# Patient Record
Sex: Male | Born: 1955 | Race: Black or African American | Hispanic: No | Marital: Single | Smoking: Former smoker
Health system: Southern US, Community
[De-identification: ages and names within clinical notes are randomized; demographics above are authoritative.]

## PROBLEM LIST (undated history)

## (undated) DIAGNOSIS — Z8719 Personal history of other diseases of the digestive system: Secondary | ICD-10-CM

## (undated) DIAGNOSIS — K56600 Partial intestinal obstruction, unspecified as to cause: Secondary | ICD-10-CM

## (undated) DIAGNOSIS — K5909 Other constipation: Secondary | ICD-10-CM

## (undated) DIAGNOSIS — D649 Anemia, unspecified: Secondary | ICD-10-CM

## (undated) DIAGNOSIS — F79 Unspecified intellectual disabilities: Secondary | ICD-10-CM

## (undated) DIAGNOSIS — I5022 Chronic systolic (congestive) heart failure: Secondary | ICD-10-CM

## (undated) DIAGNOSIS — F209 Schizophrenia, unspecified: Secondary | ICD-10-CM

## (undated) DIAGNOSIS — N400 Enlarged prostate without lower urinary tract symptoms: Secondary | ICD-10-CM

## (undated) DIAGNOSIS — K56609 Unspecified intestinal obstruction, unspecified as to partial versus complete obstruction: Secondary | ICD-10-CM

## (undated) HISTORY — DX: Benign prostatic hyperplasia without lower urinary tract symptoms: N40.0

## (undated) HISTORY — PX: ABDOMINAL HYSTERECTOMY: SHX81

---

## 2007-05-22 ENCOUNTER — Emergency Department (HOSPITAL_COMMUNITY): Admission: EM | Admit: 2007-05-22 | Discharge: 2007-05-22 | Payer: Self-pay | Admitting: Emergency Medicine

## 2009-11-18 ENCOUNTER — Observation Stay (HOSPITAL_COMMUNITY): Admission: EM | Admit: 2009-11-18 | Discharge: 2009-11-19 | Payer: Self-pay | Admitting: Emergency Medicine

## 2011-01-08 LAB — BASIC METABOLIC PANEL
BUN: 18 mg/dL (ref 6–23)
Calcium: 9.6 mg/dL (ref 8.4–10.5)
Creatinine, Ser: 1.19 mg/dL (ref 0.4–1.5)
GFR calc non Af Amer: >60 mL/min (ref >60)
Glucose, Bld: 80 mg/dL (ref 70–99)

## 2011-01-08 LAB — CBC
Platelets: 298 10*3/uL (ref 150–400)
RDW: 15.2 % (ref 11.5–15.5)
WBC: 14.2 10*3/uL — ABNORMAL HIGH (ref 4.0–10.5)

## 2011-01-08 LAB — URINALYSIS, ROUTINE W REFLEX MICROSCOPIC
Glucose, UA: NEGATIVE mg/dL
Leukocytes, UA: NEGATIVE
Nitrite: NEGATIVE
Protein, ur: NEGATIVE mg/dL
pH: 6 (ref 5.0–8.0)

## 2011-01-08 LAB — RAPID URINE DRUG SCREEN, HOSP PERFORMED
Cocaine: NOT DETECTED
Opiates: NOT DETECTED
Tetrahydrocannabinol: NOT DETECTED

## 2011-01-08 LAB — HEPATIC FUNCTION PANEL
ALT: 26 U/L (ref 0–53)
Bilirubin, Direct: 0.2 mg/dL (ref 0.0–0.3)
Indirect Bilirubin: 0.4 mg/dL (ref 0.3–0.9)
Total Protein: 6.2 g/dL (ref 6.0–8.3)

## 2011-01-08 LAB — DIFFERENTIAL
Basophils Absolute: 0.2 10*3/uL — ABNORMAL HIGH (ref 0.0–0.1)
Lymphocytes Relative: 16 % (ref 12–46)
Lymphs Abs: 2.3 10*3/uL (ref 0.7–4.0)
Neutrophils Relative %: 72 % (ref 43–77)

## 2011-01-08 LAB — ACETAMINOPHEN LEVEL: Acetaminophen (Tylenol), Serum: <10 ug/mL — ABNORMAL LOW (ref 10–30)

## 2011-01-08 LAB — POCT CARDIAC MARKERS
CKMB, poc: 4.4 ng/mL (ref 1.0–8.0)
Troponin i, poc: <0.05 ng/mL (ref 0.00–0.09)

## 2011-01-08 LAB — URINE MICROSCOPIC-ADD ON

## 2011-01-08 LAB — ETHANOL: Alcohol, Ethyl (B): <5 mg/dL (ref 0–10)

## 2011-11-14 ENCOUNTER — Ambulatory Visit (INDEPENDENT_AMBULATORY_CARE_PROVIDER_SITE_OTHER): Payer: Medicare Other

## 2011-11-14 DIAGNOSIS — Z23 Encounter for immunization: Secondary | ICD-10-CM

## 2011-11-14 DIAGNOSIS — Z125 Encounter for screening for malignant neoplasm of prostate: Secondary | ICD-10-CM

## 2011-11-14 DIAGNOSIS — K5900 Constipation, unspecified: Secondary | ICD-10-CM

## 2011-11-14 DIAGNOSIS — Z0000 Encounter for general adult medical examination without abnormal findings: Secondary | ICD-10-CM

## 2012-04-25 ENCOUNTER — Emergency Department (HOSPITAL_COMMUNITY): Payer: Medicare Other

## 2012-04-25 ENCOUNTER — Observation Stay (HOSPITAL_COMMUNITY)
Admission: EM | Admit: 2012-04-25 | Discharge: 2012-04-26 | Payer: Medicare Other | Attending: Family Medicine | Admitting: Family Medicine

## 2012-04-25 ENCOUNTER — Ambulatory Visit (INDEPENDENT_AMBULATORY_CARE_PROVIDER_SITE_OTHER): Payer: Medicare Other | Admitting: Emergency Medicine

## 2012-04-25 ENCOUNTER — Encounter (HOSPITAL_COMMUNITY): Payer: Self-pay | Admitting: *Deleted

## 2012-04-25 ENCOUNTER — Ambulatory Visit: Payer: Medicare Other

## 2012-04-25 VITALS — BP 112/76 | HR 114 | Temp 98.7°F | Resp 20 | Ht 72.25 in | Wt 168.0 lb

## 2012-04-25 DIAGNOSIS — K5900 Constipation, unspecified: Secondary | ICD-10-CM

## 2012-04-25 DIAGNOSIS — F32A Depression, unspecified: Secondary | ICD-10-CM | POA: Diagnosis present

## 2012-04-25 DIAGNOSIS — D72829 Elevated white blood cell count, unspecified: Secondary | ICD-10-CM | POA: Diagnosis present

## 2012-04-25 DIAGNOSIS — Z23 Encounter for immunization: Secondary | ICD-10-CM | POA: Insufficient documentation

## 2012-04-25 DIAGNOSIS — K56600 Partial intestinal obstruction, unspecified as to cause: Secondary | ICD-10-CM | POA: Diagnosis present

## 2012-04-25 DIAGNOSIS — Z79899 Other long term (current) drug therapy: Secondary | ICD-10-CM | POA: Insufficient documentation

## 2012-04-25 DIAGNOSIS — K56609 Unspecified intestinal obstruction, unspecified as to partial versus complete obstruction: Principal | ICD-10-CM | POA: Insufficient documentation

## 2012-04-25 DIAGNOSIS — R112 Nausea with vomiting, unspecified: Secondary | ICD-10-CM | POA: Insufficient documentation

## 2012-04-25 DIAGNOSIS — E860 Dehydration: Secondary | ICD-10-CM | POA: Diagnosis present

## 2012-04-25 DIAGNOSIS — R109 Unspecified abdominal pain: Secondary | ICD-10-CM | POA: Insufficient documentation

## 2012-04-25 DIAGNOSIS — R1110 Vomiting, unspecified: Secondary | ICD-10-CM

## 2012-04-25 DIAGNOSIS — F3289 Other specified depressive episodes: Secondary | ICD-10-CM | POA: Insufficient documentation

## 2012-04-25 DIAGNOSIS — F419 Anxiety disorder, unspecified: Secondary | ICD-10-CM | POA: Diagnosis present

## 2012-04-25 DIAGNOSIS — F411 Generalized anxiety disorder: Secondary | ICD-10-CM | POA: Insufficient documentation

## 2012-04-25 DIAGNOSIS — F17200 Nicotine dependence, unspecified, uncomplicated: Secondary | ICD-10-CM | POA: Insufficient documentation

## 2012-04-25 DIAGNOSIS — F341 Dysthymic disorder: Secondary | ICD-10-CM

## 2012-04-25 DIAGNOSIS — K5901 Slow transit constipation: Secondary | ICD-10-CM | POA: Diagnosis present

## 2012-04-25 DIAGNOSIS — F329 Major depressive disorder, single episode, unspecified: Secondary | ICD-10-CM | POA: Diagnosis present

## 2012-04-25 DIAGNOSIS — F79 Unspecified intellectual disabilities: Secondary | ICD-10-CM | POA: Diagnosis present

## 2012-04-25 DIAGNOSIS — F209 Schizophrenia, unspecified: Secondary | ICD-10-CM | POA: Diagnosis present

## 2012-04-25 DIAGNOSIS — K08109 Complete loss of teeth, unspecified cause, unspecified class: Secondary | ICD-10-CM | POA: Diagnosis present

## 2012-04-25 HISTORY — DX: Schizophrenia, unspecified: F20.9

## 2012-04-25 HISTORY — DX: Anemia, unspecified: D64.9

## 2012-04-25 HISTORY — DX: Unspecified intellectual disabilities: F79

## 2012-04-25 LAB — COMPREHENSIVE METABOLIC PANEL
ALT: 14 U/L (ref 0–53)
BUN: 21 mg/dL (ref 6–23)
CO2: 27 mEq/L (ref 19–32)
Calcium: 9.7 mg/dL (ref 8.4–10.5)
Chloride: 105 mEq/L (ref 96–112)
Creat: 1.16 mg/dL (ref 0.50–1.35)
Glucose, Bld: 127 mg/dL — ABNORMAL HIGH (ref 70–99)
Total Bilirubin: 0.6 mg/dL (ref 0.3–1.2)

## 2012-04-25 LAB — POCT I-STAT, CHEM 8
Chloride: 103 mEq/L (ref 96–112)
Glucose, Bld: 124 mg/dL — ABNORMAL HIGH (ref 70–99)
HCT: 43 % (ref 39.0–52.0)
Hemoglobin: 14.6 g/dL (ref 13.0–17.0)
Potassium: 4 mEq/L (ref 3.5–5.1)

## 2012-04-25 LAB — POCT CBC
Granulocyte percent: 64 %G (ref 37–80)
HCT, POC: 47.5 % (ref 43.5–53.7)
Lymph, poc: 2.6 (ref 0.6–3.4)
MCH, POC: 30.3 pg (ref 27–31.2)
MCHC: 31.2 g/dL — AB (ref 31.8–35.4)
MCV: 97.1 fL — AB (ref 80–97)
MID (cbc): 1.6 — AB (ref 0–0.9)
POC LYMPH PERCENT: 22.3 %L (ref 10–50)
Platelet Count, POC: 348 10*3/uL (ref 142–424)
RDW, POC: 16.3 %
WBC: 11.7 10*3/uL — AB (ref 4.6–10.2)

## 2012-04-25 LAB — AMYLASE: Amylase: 35 U/L (ref 0–105)

## 2012-04-25 LAB — CBC
Hemoglobin: 14.1 g/dL (ref 13.0–17.0)
MCHC: 32.9 g/dL (ref 30.0–36.0)
RDW: 16.1 % — ABNORMAL HIGH (ref 11.5–15.5)
WBC: 10 10*3/uL (ref 4.0–10.5)

## 2012-04-25 LAB — TSH: TSH: 0.472 u[IU]/mL (ref 0.350–4.500)

## 2012-04-25 LAB — LIPASE: Lipase: 12 U/L (ref 0–75)

## 2012-04-25 MED ORDER — CLONAZEPAM 0.5 MG PO TABS
0.5000 mg | ORAL_TABLET | Freq: Two times a day (BID) | ORAL | Status: DC
  Administered 2012-04-25 – 2012-04-26 (×2): 0.5 mg via ORAL
  Filled 2012-04-25 (×2): qty 1

## 2012-04-25 MED ORDER — ONDANSETRON HCL 4 MG/2ML IJ SOLN: 4.0000 mg | Freq: Four times a day (QID) | INTRAMUSCULAR | Status: DC | PRN

## 2012-04-25 MED ORDER — ONDANSETRON HCL 4 MG PO TABS: 4.0000 mg | ORAL_TABLET | Freq: Four times a day (QID) | ORAL | Status: DC | PRN

## 2012-04-25 MED ORDER — PNEUMOCOCCAL VAC POLYVALENT 25 MCG/0.5ML IJ INJ
0.5000 mL | INJECTION | INTRAMUSCULAR | Status: AC
  Administered 2012-04-26: 0.5 mL via INTRAMUSCULAR
  Filled 2012-04-25: qty 0.5

## 2012-04-25 MED ORDER — SODIUM CHLORIDE 0.9 % IV SOLN
INTRAVENOUS | Status: DC
  Administered 2012-04-25 – 2012-04-26 (×2): via INTRAVENOUS

## 2012-04-25 MED ORDER — SORBITOL 70 % SOLN
960.0000 mL | TOPICAL_OIL | Freq: Once | ORAL | Status: AC
  Administered 2012-04-25: 960 mL via RECTAL
  Filled 2012-04-25: qty 240

## 2012-04-25 MED ORDER — SODIUM CHLORIDE 0.9 % IV BOLUS (SEPSIS)
1000.0000 mL | Freq: Once | INTRAVENOUS | Status: AC
  Administered 2012-04-25: 1000 mL via INTRAVENOUS

## 2012-04-25 MED ORDER — CLOZAPINE 100 MG PO TABS
300.0000 mg | ORAL_TABLET | Freq: Every day | ORAL | Status: DC
  Administered 2012-04-25: 300 mg via ORAL
  Filled 2012-04-25 (×2): qty 3

## 2012-04-25 MED ORDER — ACETAMINOPHEN 325 MG PO TABS: 650.0000 mg | ORAL_TABLET | Freq: Four times a day (QID) | ORAL | Status: DC | PRN

## 2012-04-25 MED ORDER — IOHEXOL 300 MG/ML  SOLN
100.0000 mL | Freq: Once | INTRAMUSCULAR | Status: AC | PRN
  Administered 2012-04-25: 100 mL via INTRAVENOUS

## 2012-04-25 MED ORDER — SODIUM CHLORIDE 0.9 % IV SOLN
INTRAVENOUS | Status: DC
  Administered 2012-04-25: 14:00:00 via INTRAVENOUS

## 2012-04-25 MED ORDER — POLYETHYLENE GLYCOL 3350 17 G PO PACK
17.0000 g | PACK | Freq: Every day | ORAL | Status: DC
  Administered 2012-04-25: 17 g via ORAL
  Filled 2012-04-25 (×2): qty 1

## 2012-04-25 MED ORDER — BENZTROPINE MESYLATE 1 MG PO TABS
1.0000 mg | ORAL_TABLET | Freq: Three times a day (TID) | ORAL | Status: DC
  Administered 2012-04-25 – 2012-04-26 (×3): 1 mg via ORAL
  Filled 2012-04-25 (×5): qty 1

## 2012-04-25 MED ORDER — SODIUM CHLORIDE 0.9 % IV SOLN
INTRAVENOUS | Status: AC
  Administered 2012-04-25: 19:00:00 via INTRAVENOUS

## 2012-04-25 MED ORDER — ONDANSETRON HCL 4 MG/2ML IJ SOLN
4.0000 mg | Freq: Three times a day (TID) | INTRAMUSCULAR | Status: AC | PRN
Start: 2012-04-25 — End: 2012-04-26

## 2012-04-25 MED ORDER — PANTOPRAZOLE SODIUM 40 MG PO TBEC
40.0000 mg | DELAYED_RELEASE_TABLET | Freq: Every day | ORAL | Status: DC
  Administered 2012-04-26: 40 mg via ORAL
  Filled 2012-04-25: qty 1

## 2012-04-25 MED ORDER — ENOXAPARIN SODIUM 40 MG/0.4ML ~~LOC~~ SOLN
40.0000 mg | SUBCUTANEOUS | Status: DC
  Administered 2012-04-25: 40 mg via SUBCUTANEOUS
  Filled 2012-04-25 (×2): qty 0.4

## 2012-04-25 MED ORDER — ONDANSETRON HCL 4 MG/2ML IJ SOLN
4.0000 mg | Freq: Once | INTRAMUSCULAR | Status: AC
  Administered 2012-04-25: 4 mg via INTRAVENOUS
  Filled 2012-04-25: qty 2

## 2012-04-25 MED ORDER — PAROXETINE HCL 20 MG PO TABS
20.0000 mg | ORAL_TABLET | Freq: Every day | ORAL | Status: DC
  Administered 2012-04-25 – 2012-04-26 (×2): 20 mg via ORAL
  Filled 2012-04-25 (×2): qty 1

## 2012-04-25 MED ORDER — ACETAMINOPHEN 650 MG RE SUPP: 650.0000 mg | Freq: Four times a day (QID) | RECTAL | Status: DC | PRN

## 2012-04-25 MED ORDER — POLYETHYLENE GLYCOL 3350 17 G PO PACK
17.0000 g | PACK | Freq: Two times a day (BID) | ORAL | Status: DC
  Administered 2012-04-25: 17 g via ORAL
  Filled 2012-04-25 (×3): qty 1

## 2012-04-25 NOTE — Progress Notes (Signed)
  Subjective:    Patient ID: Jordan Kennedy, male    DOB: 1956-03-16, 56 y.o.   MRN: [id number]  HPI patient is a mentally challenged schizophrenic who lives in a group home who enters with a chief complaint of difficulty having a bowel movement. He denies any fever or chills. He has had 2 episodes of vomiting last night he states he feels better today but still uncomfortable in his abdomen    Review of Systems     Objective:   Physical Exam physical exam reveals an alert gentleman who is not in any distress. He is cooperative. His chest is clear his cardiac exam reveals occasional skipped beats but no murmurs or gallops. The abdomen is appears to be on visual inspection full on the right however I do not feel a definite mass in this area. He did not seem to be tender in any areas of his abdomen.His  exam revealed a normal size prostate but there was no stool in the rectal vault. hemosure was done.  Results for orders placed in visit on 04/25/12  POCT CBC      Component Value Range   WBC 11.7 (*) 4.6 - 10.2 K/uL   Lymph, poc 2.6  0.6 - 3.4   POC LYMPH PERCENT 22.3  10 - 50 %L   MID (cbc) 1.6 (*) 0 - 0.9   POC MID % 13.7 (*) 0 - 12 %M   POC Granulocyte 7.5 (*) 2 - 6.9   Granulocyte percent 64.0  37 - 80 %G   RBC 4.89  4.69 - 6.13 M/uL   Hemoglobin 14.8  14.1 - 18.1 g/dL   HCT, POC 40.9  81.1 - 53.7 %   MCV 97.1 (*) 80 - 97 fL   MCH, POC 30.3  27 - 31.2 pg   MCHC 31.2 (*) 31.8 - 35.4 g/dL   RDW, POC 91.4     Platelet Count, POC 348  142 - 424 K/uL   MPV 8.8  0 - 99.8 fL  IFOBT (OCCULT BLOOD)      Component Value Range   IFOBT Negative     [facility] reading (PRIMARY) by  Dr. Cleta Alberts x-ray shows a significant stool burden with markedly dilated colon there does appear to be some air in the rectum. There is no free air noted beneath the diaphragm. .      Assessment & Plan:  Case discussed with radiology. There is a suspicion this could be an early small bowel obstruction. There does appear  to be chronic constipation also. Patient to be sent to the emergency room for evaluation. I gave these instructions to his caregiver and he is to go from here to the emergency room at [facility]

## 2012-04-25 NOTE — Patient Instructions (Addendum)
Please go to the emergency room at [facility] . They will evaluate you through the emergency room for possible early small bowel obstruction.

## 2012-04-25 NOTE — Progress Notes (Addendum)
Jordan Kennedy tolerated appx 650 ccs of the SMOG enema. Then started feeling strong pressure to move bowels, which moved strongly but with very little solid matter moved at first. Shortly afterward, pt experienced a large bowel movement and stated he felt relief. Merlyn Albert [employer] fundraiser

## 2012-04-25 NOTE — H&P (Signed)
History and Physical Examination  Date: 04/25/2012  Patient name: Jordan Kennedy Medical record number: [id number] Date of birth: [birth year] Age: 56 y.o. Gender: male PCP: GUEST, Loretha Stapler, MD  Chief Complaint:  Chief Complaint  Patient presents with  . Abdominal Pain    Historians: History was taken from emergency department physician, records and patient who was a poor historian because of his mental retardation.  History of Present Illness: Jordan Kennedy is an 56 y.o. male group home resident with schizophrenia and history of mental retardation was sent to the emergency department today because of 2 days of nausea and vomiting.  The patient also complained of abdominal distention and pain.  He had complained of being constipated for the last several days and symptoms of worsening constipation reported.  He saw his primary care provider today who ordered a CBC which revealed an elevated white blood cell count of 11.7 and x-rays of the abdomen significant for large stool burden and concern for possible partial small bowel obstruction.  Patient was sent to the emergency department and evaluated.  He had a CT scan that confirmed partial small bowel obstruction and significant stool burden.  The patient was found to be clinically dehydrated.  He was started on IV fluids.  An observation admission was requested for assistance with controlling his chronic constipation and partial small bowel obstruction.  Also, he is being treated with IV fluids for dehydration.  His vomiting has resolved but he still has nausea at this time.  Past Medical History Past Medical History  Diagnosis Date  . Schizophrenia   . Mental retardation     Past Surgical History History reviewed. No pertinent past surgical history.  Home Meds: Prior to Admission medications   Medication Sig Start Date End Date Taking? Authorizing Provider  benztropine (COGENTIN) 1 MG tablet Take 1 mg by mouth 3 (three) times daily.    Yes Historical Provider, MD  clonazePAM (KLONOPIN) 0.5 MG tablet Take 0.5 mg by mouth 2 (two) times daily.   Yes Historical Provider, MD  cloZAPine (CLOZARIL) 100 MG tablet Take 300 mg by mouth at bedtime.    Yes Historical Provider, MD  PARoxetine (PAXIL) 20 MG tablet Take 20 mg by mouth every morning.   Yes Historical Provider, MD    Allergies: Review of patient's allergies indicates no known allergies.  Social History:  History   Social History  . Marital Status: Single    Spouse Name: N/A    Number of Children: N/A  . Years of Education: N/A   Occupational History  . Not on file.   Social History Main Topics  . Smoking status: Current Everyday Smoker -- 0.3 packs/day    Types: Cigarettes  . Smokeless tobacco: Not on file  . Alcohol Use: No  . Drug Use: Not on file  . Sexually Active: Not on file   Other Topics Concern  . Not on file   Social History Narrative  . No narrative on file   Family History: Noncontributory  Review of Systems: Pertinent items are noted in HPI. All other systems reviewed and reported as negative.   Physical Exam: Blood pressure 106/87, pulse 104, temperature 99.4 F (37.4 C), temperature source Oral, resp. rate 20, SpO2 100.00%. General appearance: alert, cooperative, appears stated age and no distress Head: Normocephalic, without obvious abnormality, atraumatic Ears: normal TM's and external ear canals both ears Nose: Nares normal. Septum midline. Mucosa normal. No drainage or sinus tenderness., no discharge Throat: edentulous Neck: no adenopathy,  no carotid bruit, no JVD, supple, symmetrical, trachea midline and thyroid not enlarged, symmetric, no tenderness/mass/nodules Lungs: clear to auscultation bilaterally and normal percussion bilaterally Chest wall: no tenderness Heart: regular rate and rhythm, S1, S2 normal, no murmur, click, rub or gallop Abdomen: mild distension, normal BS, no masses palpated, no HSM, generalized TTP, no  guarding or rebound TTP Extremities: extremities normal, atraumatic, no cyanosis or edema Pulses: 2+ and symmetric Skin: Skin color, texture, turgor normal. No rashes or lesions Neurologic: Grossly normal  Lab  And Imaging results:  Results for orders placed during the hospital encounter of 04/25/12 (from the past 24 hour(s))  POCT I-STAT, CHEM 8     Status: Abnormal   Collection Time   04/25/12  2:06 PM      Component Value Range   Sodium 142  135 - 145 mEq/L   Potassium 4.0  3.5 - 5.1 mEq/L   Chloride 103  96 - 112 mEq/L   BUN 20  6 - 23 mg/dL   Creatinine, Ser 9.14  0.50 - 1.35 mg/dL   Glucose, Bld 782 (*) 70 - 99 mg/dL   Calcium, Ion 9.56  2.13 - 1.23 mmol/L   TCO2 26  0 - 100 mmol/L   Hemoglobin 14.6  13.0 - 17.0 g/dL   HCT 08.6  57.8 - 46.9 %  CBC     Status: Abnormal   Collection Time   04/25/12  3:55 PM      Component Value Range   WBC 10.0  4.0 - 10.5 K/uL   RBC 4.60  4.22 - 5.81 MIL/uL   Hemoglobin 14.1  13.0 - 17.0 g/dL   HCT 62.9  52.8 - 41.3 %   MCV 93.3  78.0 - 100.0 fL   MCH 30.7  26.0 - 34.0 pg   MCHC 32.9  30.0 - 36.0 g/dL   RDW 24.4 (*) 01.0 - 27.2 %   Platelets 309  150 - 400 K/uL     Impression   *Partial small bowel obstruction  Schizophrenia  Mental retardation  Constipation  Leukocytosis, unspecified - resolved on recheck today at 3:55 pm  Dehydration  Anxiety and depression  Edentulous   Plan  Admit for observation, continue clear liquids as tolerated, treat constipation with miralax and provide enema treatment.  Provide medications for nausea, continue IVF hydration.   Resume home medications for psychiatric conditions.   Please see orders and follow hospital course.  DVT prophylaxis and protonix ordered.    Standley Dakins MD [facility] [place], [place] [phone] 04/25/2012, 4:56 PM

## 2012-04-25 NOTE — ED Notes (Addendum)
Pt has been complaining of constipating and generalized abdominal pain starting yesterday.  Pt has history of intermittent constipation. Pt reports last BM was two days ago- no blood noted. Pt has had one episode of N/V yesterday.  Pt abdomen appears distended. Pt was seen by MD Daub today in office and noted to have WBC at 11.7.

## 2012-04-25 NOTE — ED Notes (Addendum)
Pt states he was sent here from PCP for ct scan.  States he had bloodwork and xray done at pcp this am.  Xray showed "bile" according to caregiver.  Hx of constipation, LBM a few days ago.

## 2012-04-25 NOTE — ED Provider Notes (Signed)
History     CSN: [id number]  Arrival date & time 04/25/12  1231   First MD Initiated Contact with Patient 04/25/12 1314      Chief Complaint  Patient presents with  . Abdominal Pain    (Consider location/radiation/quality/duration/timing/severity/associated sxs/prior treatment) Patient is a 56 y.o. male presenting with abdominal pain. The history is provided by the patient and a caregiver. The history is limited by a developmental delay.  Abdominal Pain The primary symptoms of the illness include abdominal pain and vomiting. The primary symptoms of the illness do not include diarrhea.  Additional symptoms associated with the illness include constipation.  pt has mr.  He lives in group home.  He has not had bm for 2 d.  Went to [facility] had xr and cbc. Was sent here for eval.   Pt has abd pain and distention.  Vomited last night once.  No nausea now.  No hx of abd surgery.    Past Medical History  Diagnosis Date  . Schizophrenia   . Mental retardation     History reviewed. No pertinent past surgical history.  No family history on file.  History  Substance Use Topics  . Smoking status: Current Everyday Smoker -- 0.3 packs/day    Types: Cigarettes  . Smokeless tobacco: Not on file  . Alcohol Use: No      Review of Systems  Gastrointestinal: Positive for vomiting, abdominal pain, constipation and abdominal distention. Negative for diarrhea.  All other systems reviewed and are negative.    Allergies  Review of patient's allergies indicates no known allergies.  Home Medications   Current Outpatient Rx  Name Route Sig Dispense Refill  . BENZTROPINE MESYLATE 1 MG PO TABS Oral Take 1 mg by mouth 3 (three) times daily.    Marland Kitchen CLONAZEPAM 0.5 MG PO TABS Oral Take 0.5 mg by mouth 2 (two) times daily.    Marland Kitchen CLOZAPINE 100 MG PO TABS Oral Take 300 mg by mouth at bedtime.     Marland Kitchen PAROXETINE HCL 20 MG PO TABS Oral Take 20 mg by mouth every morning.      BP 120/76  Pulse 111  Temp  98.4 F (36.9 C) (Oral)  Resp 17  SpO2 98%  Physical Exam  Nursing note and vitals reviewed. Constitutional: He appears well-developed and well-nourished. No distress.  HENT:  Head: Normocephalic and atraumatic.  Eyes: Conjunctivae are normal.  Neck: Normal range of motion. Neck supple.  Cardiovascular:  No murmur heard.      tachycardia  Pulmonary/Chest: Effort normal. No respiratory distress. He has no rales.  Abdominal: Soft. He exhibits distension. He exhibits no mass. There is no tenderness. There is no rebound and no guarding.       Decreased bowel sounds  Genitourinary: Rectum normal.       No stool in rectal vault  Musculoskeletal: Normal range of motion. He exhibits no edema.  Neurological: He is alert.  Skin: Skin is warm and dry.    ED Course  Procedures (including critical care time)  Labs Reviewed - No data to display Dg Abd Acute W/chest  04/25/2012  *RADIOLOGY REPORT*  Clinical Data: Constipation  ACUTE ABDOMEN SERIES (ABDOMEN 2 VIEW & CHEST 1 VIEW)  Comparison: Chest radiograph dated 11/18/2009  Findings: Lungs are clear. No pleural effusion or pneumothorax.  Cardiomediastinal silhouette is within normal limits.  Nonspecific bowel gas pattern, with mildly dilated loops of small bowel.  However, the colon is not decompressed.  Moderate stool in  the left colon.  No evidence of free air under the diaphragm on the upright view.  IMPRESSION: Mildly dilated loops of small bowel, favored to reflect adynamic ileus, early/partial small bowel obstruction not entirely excluded.  Moderate stool in the left colon.  No evidence of acute cardiopulmonary disease.  Clinically significant discrepancy from primary report, if provided: None  Original Report Authenticated By: Charline Bills, M.D.     No diagnosis found.    MDM  abd distention        Cheri Guppy, MD 04/25/12 1343

## 2012-04-25 NOTE — ED Provider Notes (Signed)
4:18 PM I will omit this patient for partial small bowel obstruction.  He still nauseated at this time.  He has skipped a small amount of fluids down I think the patient would benefit from 4 hour observation given that he still nauseated and having some abdominal pain.  The hospitalist will be called for admission  The encounter diagnosis was Partial small bowel obstruction.  Ct Abdomen Pelvis W Contrast  04/25/2012  *RADIOLOGY REPORT*  Clinical Data: Abdominal pain.  Possible small bowel obstruction and at radiography.  CT ABDOMEN AND PELVIS WITH CONTRAST  Technique:  Multidetector CT imaging of the abdomen and pelvis was performed following the standard protocol during bolus administration of intravenous contrast.  Contrast:  100 ml Omnipaque-300  Comparison: Radiography same day  Findings: Lung bases show minimal atelectasis.  There may be a tiny amount of pleural fluid on the right.  The liver parenchyma shows a 1 cm low density in the lateral edge of the posterior segment of the right lobe, a questionable 8 mm low density at the dome and a 9 mm low density adjacent to the intrahepatic IVC.  No calcified gallstones.  The spleen is normal. The pancreas is normal.  The adrenal glands are normal.  The kidneys are normal.  The aorta and IVC are normal.  No retroperitoneal mass or adenopathy.  No free intraperitoneal fluid or air.  Bladder, prostate gland and seminal vesicles appear unremarkable.  There is a large amount of fecal matter throughout the colon.  The distal small bowel is collapsed.  The proximal small bowel is dilated, probably to the mid ileal level.  I cannot see a discrete obstructing lesion. Just proximal to the caliber change, there is some "small bowel stool" appearance.  This could be due to material being obstructed at this site or conceivably to a bezoar.  Normal appendix is visualized.  No significant bony finding.  IMPRESSION: Partial small bowel obstruction, probably at the mid ileal  region. Specific etiology not identified. Just proximal to the probable point of bowel obstruction, there is some possible feculent material within the small intestine.  Bezoar is considered as well.  Large amount of fecal matter in the colon consistent with constipation.  Normal appearing appendix.  Three indeterminate low densities in the liver, 8-10 mm in size. Depending on the clinical course and eventual recent for a small bowel findings, evaluation with MRI might be in order to characterize these.  Original Report Authenticated By: Thomasenia Sales, M.D.   Dg Abd Acute W/chest  04/25/2012  *RADIOLOGY REPORT*  Clinical Data: Constipation  ACUTE ABDOMEN SERIES (ABDOMEN 2 VIEW & CHEST 1 VIEW)  Comparison: Chest radiograph dated 11/18/2009  Findings: Lungs are clear. No pleural effusion or pneumothorax.  Cardiomediastinal silhouette is within normal limits.  Nonspecific bowel gas pattern, with mildly dilated loops of small bowel.  However, the colon is not decompressed.  Moderate stool in the left colon.  No evidence of free air under the diaphragm on the upright view.  IMPRESSION: Mildly dilated loops of small bowel, favored to reflect adynamic ileus, early/partial small bowel obstruction not entirely excluded.  Moderate stool in the left colon.  No evidence of acute cardiopulmonary disease.  Clinically significant discrepancy from primary report, if provided: None  Original Report Authenticated By: Charline Bills, M.D.    I personally reviewed the imaging tests through PACS system     Lyanne Co, MD 04/25/12 [phone]

## 2012-04-26 LAB — BASIC METABOLIC PANEL
BUN: 13 mg/dL (ref 6–23)
CO2: 25 mEq/L (ref 19–32)
Chloride: 104 mEq/L (ref 96–112)
Creatinine, Ser: 1.05 mg/dL (ref 0.50–1.35)
GFR calc Af Amer: >90 mL/min (ref >90)
Glucose, Bld: 113 mg/dL — ABNORMAL HIGH (ref 70–99)
Potassium: 3.8 mEq/L (ref 3.5–5.1)

## 2012-04-26 MED ORDER — POLYETHYLENE GLYCOL 3350 17 G PO PACK: 17.0000 g | PACK | Freq: Two times a day (BID) | ORAL | Status: AC

## 2012-04-26 NOTE — Discharge Summary (Signed)
Physician Discharge Summary  Patient ID: Jordan Kennedy MRN: [id number] DOB/AGE: September 14, 1956 56 y.o.  Admit date: 04/25/2012 Discharge date: 04/26/2012  Admission Diagnoses: Partial SBO Chronic Constipation Abdominal pain  Discharge Diagnoses:  Active Problems:  Schizophrenia  Mental retardation  Constipation  Leukocytosis, unspecified  Dehydration  Anxiety and depression  Edentulous  Discharged Condition: good  Hospital Course:   Pt was admitted and given an enema and started on oral miralax therapy.  He had a very large BM after the enema and reported that he felt much better.  Pt tolerated diet and was discharged on oral laxatives.   He will need to wear adult diapers while he is on laxatives.    Discharge Exam: Pt says that he feels much better today.  He had a very large BM and belly is soft and tolerating diet.  No further Nausea and vomiting.  Blood pressure 127/79, pulse 104, temperature 99.5 F (37.5 C), temperature source Oral, resp. rate 18, height 6' (1.829 m), weight 78 kg (171 lb 15.3 oz), SpO2 97.00%. General - awake, alert, no distress Heent - NCAT, MMM Lungs - BBS clear Abd - soft, nondistended, nontender, no masses, normal bs Ext - no CCE  Disposition:  Return to [facility] I spoke with his caretaker and provided discharge instructions, information, etc.    Discharge Orders    Future Orders Please Complete By Expires   Increase activity slowly      Discharge instructions      Comments:   PLEASE HAVE PATIENT WEAR ADULT DIAPERS WHILE HE IS TAKING LAXATIVES RETURN IF SYMPTOMS RECUR, WORSEN OR NEW PROBLEMS DEVELOP.     Medication List  As of 04/26/2012 11:47 AM   TAKE these medications         benztropine 1 MG tablet   Commonly known as: COGENTIN   Take 1 mg by mouth 3 (three) times daily.      clonazePAM 0.5 MG tablet   Commonly known as: KLONOPIN   Take 0.5 mg by mouth 2 (two) times daily.      cloZAPine 100 MG tablet   Commonly known as:  CLOZARIL   Take 300 mg by mouth at bedtime.      PARoxetine 20 MG tablet   Commonly known as: PAXIL   Take 20 mg by mouth every morning.      polyethylene glycol packet   Commonly known as: MIRALAX / GLYCOLAX   Take 17 g by mouth 2 (two) times daily.           Follow-up Information    Follow up with GUEST, Loretha Stapler, MD in 1 week. [facility] Follow Up)    Contact information:   [address] [place] [address] [phone]         Signed: Standley Dakins 04/26/2012, 11:47 AM

## 2012-04-26 NOTE — Progress Notes (Signed)
Patient discharged with caregiver.  Discharge instructions given and prescription given. No change from morning assessment.

## 2012-06-12 ENCOUNTER — Encounter (HOSPITAL_COMMUNITY): Payer: Self-pay | Admitting: Emergency Medicine

## 2012-06-12 ENCOUNTER — Emergency Department (HOSPITAL_COMMUNITY): Payer: Medicare Other

## 2012-06-12 ENCOUNTER — Emergency Department (HOSPITAL_COMMUNITY)
Admission: EM | Admit: 2012-06-12 | Discharge: 2012-06-12 | Disposition: A | Discharge status: Home / Self Care | Payer: Medicare Other | Attending: Emergency Medicine | Admitting: Emergency Medicine

## 2012-06-12 DIAGNOSIS — Z8659 Personal history of other mental and behavioral disorders: Secondary | ICD-10-CM | POA: Insufficient documentation

## 2012-06-12 DIAGNOSIS — R112 Nausea with vomiting, unspecified: Secondary | ICD-10-CM | POA: Insufficient documentation

## 2012-06-12 DIAGNOSIS — K5900 Constipation, unspecified: Secondary | ICD-10-CM

## 2012-06-12 DIAGNOSIS — F79 Unspecified intellectual disabilities: Secondary | ICD-10-CM | POA: Insufficient documentation

## 2012-06-12 DIAGNOSIS — F17200 Nicotine dependence, unspecified, uncomplicated: Secondary | ICD-10-CM | POA: Insufficient documentation

## 2012-06-12 LAB — CBC
HCT: 39.1 % (ref 39.0–52.0)
Hemoglobin: 13.1 g/dL (ref 13.0–17.0)
MCH: 30.8 pg (ref 26.0–34.0)
MCHC: 33.5 g/dL (ref 30.0–36.0)
MCV: 91.8 fL (ref 78.0–100.0)
Platelets: 293 10*3/uL (ref 150–400)
RBC: 4.26 MIL/uL (ref 4.22–5.81)
RDW: 16 % — ABNORMAL HIGH (ref 11.5–15.5)
WBC: 12.5 10*3/uL — ABNORMAL HIGH (ref 4.0–10.5)

## 2012-06-12 LAB — COMPREHENSIVE METABOLIC PANEL
ALT: 17 U/L (ref 0–53)
AST: 18 U/L (ref 0–37)
Albumin: 3.7 g/dL (ref 3.5–5.2)
Alkaline Phosphatase: 86 U/L (ref 39–117)
BUN: 26 mg/dL — ABNORMAL HIGH (ref 6–23)
CO2: 27 mEq/L (ref 19–32)
Calcium: 9 mg/dL (ref 8.4–10.5)
Chloride: 106 mEq/L (ref 96–112)
Creatinine, Ser: 1.32 mg/dL (ref 0.50–1.35)
GFR calc Af Amer: 70 mL/min — ABNORMAL LOW (ref >90)
GFR calc non Af Amer: 60 mL/min — ABNORMAL LOW (ref >90)
Glucose, Bld: 115 mg/dL — ABNORMAL HIGH (ref 70–99)
Potassium: 3.2 mEq/L — ABNORMAL LOW (ref 3.5–5.1)
Sodium: 142 mEq/L (ref 135–145)
Total Bilirubin: 0.5 mg/dL (ref 0.3–1.2)
Total Protein: 6.8 g/dL (ref 6.0–8.3)

## 2012-06-12 LAB — URINE MICROSCOPIC-ADD ON

## 2012-06-12 LAB — URINALYSIS, ROUTINE W REFLEX MICROSCOPIC
Glucose, UA: NEGATIVE mg/dL
Leukocytes, UA: NEGATIVE
Nitrite: NEGATIVE
Protein, ur: 30 mg/dL — AB
Specific Gravity, Urine: 1.034 — ABNORMAL HIGH (ref 1.005–1.030)
Urobilinogen, UA: 1 mg/dL (ref 0.0–1.0)
pH: 5.5 (ref 5.0–8.0)

## 2012-06-12 LAB — LIPASE, BLOOD: Lipase: 17 U/L (ref 11–59)

## 2012-06-12 MED ORDER — POTASSIUM CHLORIDE CRYS ER 20 MEQ PO TBCR
40.0000 meq | EXTENDED_RELEASE_TABLET | Freq: Once | ORAL | Status: AC
  Administered 2012-06-12: 40 meq via ORAL
  Filled 2012-06-12: qty 2

## 2012-06-12 MED ORDER — MORPHINE SULFATE 4 MG/ML IJ SOLN
4.0000 mg | Freq: Once | INTRAMUSCULAR | Status: AC
  Administered 2012-06-12: 4 mg via INTRAVENOUS
  Filled 2012-06-12: qty 1

## 2012-06-12 MED ORDER — ONDANSETRON HCL 4 MG/2ML IJ SOLN
4.0000 mg | Freq: Once | INTRAMUSCULAR | Status: AC
  Administered 2012-06-12: 4 mg via INTRAVENOUS
  Filled 2012-06-12: qty 2

## 2012-06-12 MED ORDER — DOCUSATE SODIUM 100 MG PO CAPS
100.0000 mg | ORAL_CAPSULE | Freq: Two times a day (BID) | ORAL | Status: AC
Start: 2012-06-12 — End: 2012-06-22

## 2012-06-12 MED ORDER — SODIUM CHLORIDE 0.9 % IV BOLUS (SEPSIS)
1000.0000 mL | Freq: Once | INTRAVENOUS | Status: AC
  Administered 2012-06-12: 1000 mL via INTRAVENOUS

## 2012-06-12 NOTE — ED Notes (Signed)
Urinal Given, Patient advises unable to void.

## 2012-06-12 NOTE — ED Notes (Signed)
Pt is from group home. Caregiver states pt has been complaining of pain, nausea, and vomiting. Caregiver states that pt has not had a bowel movement over the past week and prescribed medications are not working.

## 2012-06-12 NOTE — ED Provider Notes (Signed)
History    56 year old male brought in for evaluation of nausea and vomiting. Onset yesterday. Multiple episodes of nonbloody emesis since then. Crampy, diffuse abdominal pain. Does not localize. History of mental retardation and is in a group home. History is somewhat limited from the patient but patient has a group home worker with him at bedside. No hx of abdominal surgery reported. No fevers or chills. No urinary complaints. No diarrhea. No sick contacts.  CSN: [id number]  Arrival date & time 06/12/12  0919   First MD Initiated Contact with Patient 06/12/12 [phone]      Chief Complaint  Patient presents with  . Abdominal Pain  . Emesis    (Consider location/radiation/quality/duration/timing/severity/associated sxs/prior treatment) HPI  Past Medical History  Diagnosis Date  . Schizophrenia   . Mental retardation   . Anemia     Past Surgical History  Procedure Date  . Abdominal hysterectomy     History reviewed. No pertinent family history.  History  Substance Use Topics  . Smoking status: Current Everyday Smoker -- 0.2 packs/day for 30 years    Types: Cigarettes  . Smokeless tobacco: Never Used  . Alcohol Use: No      Review of Systems   Review of symptoms negative unless otherwise noted in HPI.   Allergies  Review of patient's allergies indicates no known allergies.  Home Medications   Current Outpatient Rx  Name Route Sig Dispense Refill  . BENZTROPINE MESYLATE 1 MG PO TABS Oral Take 1 mg by mouth 3 (three) times daily.    Marland Kitchen CLONAZEPAM 0.5 MG PO TABS Oral Take 0.5 mg by mouth 2 (two) times daily.    Marland Kitchen CLOZAPINE 100 MG PO TABS Oral Take 200-300 mg by mouth See admin instructions. Takes 200mg  every morning and 300mg  every night at bedtime    . PAROXETINE HCL 20 MG PO TABS Oral Take 20 mg by mouth every morning.    Marland Kitchen POLYETHYLENE GLYCOL 3350 PO PACK Oral Take 17 g by mouth 2 (two) times daily.      BP 106/71  Pulse 95  Temp 98.9 F (37.2 C) (Oral)   Resp 16  SpO2 100%  Physical Exam  Nursing note and vitals reviewed. Constitutional: He appears well-developed and well-nourished. No distress.  HENT:  Head: Normocephalic and atraumatic.  Eyes: Conjunctivae are normal. Right eye exhibits no discharge. Left eye exhibits no discharge.  Neck: Neck supple.  Cardiovascular: Normal rate, regular rhythm and normal heart sounds.  Exam reveals no gallop and no friction rub.   No murmur heard. Pulmonary/Chest: Effort normal and breath sounds normal. No respiratory distress.  Abdominal: Soft. He exhibits no distension. There is no tenderness.       Abdomen normal to inspection. No surgical scars noted. No distention noted. No tenderness. No mass palpated.  Musculoskeletal: He exhibits no edema and no tenderness.  Neurological: He is alert.  Skin: Skin is warm and dry.  Psychiatric: He has a normal mood and affect. His behavior is normal. Thought content normal.    ED Course  Procedures (including critical care time)  Labs Reviewed  COMPREHENSIVE METABOLIC PANEL - Abnormal; Notable for the following:    Potassium 3.2 (*)     Glucose, Bld 115 (*)     BUN 26 (*)     GFR calc non Af Amer 60 (*)     GFR calc Af Amer 70 (*)     All other components within normal limits  CBC - Abnormal; Notable  for the following:    WBC 12.5 (*)     RDW 16.0 (*)     All other components within normal limits  LIPASE, BLOOD  URINALYSIS, ROUTINE W REFLEX MICROSCOPIC   Dg Abd Acute W/chest  06/12/2012  *RADIOLOGY REPORT*  Clinical Data: Abdominal pain  ACUTE ABDOMEN SERIES (ABDOMEN 2 VIEW & CHEST 1 VIEW)  Comparison: 04/25/2012  Findings: The heart size and mediastinal contours are within normal limits.  Both lungs are clear.  The visualized skeletal structures are unremarkable.  There is a moderate to marked stool burden identified within the colon.  A few centrally dilated loops of the small bowel are identified which measure up to 4 cm.  No fluid levels  identified on the upright images.  No free intraperitoneal air identified.  IMPRESSION:  1.  The central dilated loops of small bowel measuring up to 4 cm. Findings may reflect partial small bowel obstruction. 2.  Moderate to marked stool burden within the colon consistent with constipation. 3.  No active cardiopulmonary abnormalities.   Original Report Authenticated By: Rosealee Albee, M.D.      1. Constipation   2. Nausea and vomiting       MDM  55yM with n/v. Obstruction series with dilated bowel loops and a large stool burden. Pt given enema in ED with evacuation of large quantity of stool. Reports feels much better. No vomiting while in ED. Feel he is appropriate for DC at this time. Already on miralax. Stool softener added. Return precautions discussed.        Raeford Razor, MD 06/15/12 [phone]

## 2012-06-12 NOTE — ED Notes (Signed)
Pt is client group home, caregiver brought to the ED stating has had abd pain, pt states abd pain and vomiting started last night, was seen here a month ago for constipation-- cannot remember when last BM was

## 2013-01-31 ENCOUNTER — Ambulatory Visit (INDEPENDENT_AMBULATORY_CARE_PROVIDER_SITE_OTHER): Payer: Medicare Other | Admitting: Family Medicine

## 2013-01-31 VITALS — BP 128/83 | HR 80 | Temp 99.1°F | Resp 16 | Ht 72.5 in | Wt 169.2 lb

## 2013-01-31 DIAGNOSIS — C61 Malignant neoplasm of prostate: Secondary | ICD-10-CM

## 2013-01-31 DIAGNOSIS — Z79899 Other long term (current) drug therapy: Secondary | ICD-10-CM

## 2013-01-31 DIAGNOSIS — E785 Hyperlipidemia, unspecified: Secondary | ICD-10-CM

## 2013-01-31 DIAGNOSIS — N400 Enlarged prostate without lower urinary tract symptoms: Secondary | ICD-10-CM

## 2013-01-31 DIAGNOSIS — Z125 Encounter for screening for malignant neoplasm of prostate: Secondary | ICD-10-CM

## 2013-01-31 DIAGNOSIS — R3989 Other symptoms and signs involving the genitourinary system: Secondary | ICD-10-CM

## 2013-01-31 DIAGNOSIS — Z0000 Encounter for general adult medical examination without abnormal findings: Secondary | ICD-10-CM

## 2013-01-31 LAB — COMPREHENSIVE METABOLIC PANEL
ALT: 19 U/L (ref 0–53)
AST: 18 U/L (ref 0–37)
Albumin: 4.4 g/dL (ref 3.5–5.2)
Alkaline Phosphatase: 82 U/L (ref 39–117)
BUN: 10 mg/dL (ref 6–23)
CO2: 27 mEq/L (ref 19–32)
Calcium: 9.4 mg/dL (ref 8.4–10.5)
Chloride: 106 mEq/L (ref 96–112)
Creat: 1.1 mg/dL (ref 0.50–1.35)
Glucose, Bld: 97 mg/dL (ref 70–99)
Potassium: 4.1 mEq/L (ref 3.5–5.3)
Sodium: 140 mEq/L (ref 135–145)
Total Bilirubin: 0.5 mg/dL (ref 0.3–1.2)
Total Protein: 7.1 g/dL (ref 6.0–8.3)

## 2013-01-31 LAB — IFOBT (OCCULT BLOOD): IFOBT: NEGATIVE

## 2013-01-31 LAB — POCT CBC
Granulocyte percent: 67.8 %G (ref 37–80)
HCT, POC: 35.8 % — AB (ref 43.5–53.7)
Hemoglobin: 10.9 g/dL — AB (ref 14.1–18.1)
Lymph, poc: 1.9 (ref 0.6–3.4)
MCH, POC: 28.8 pg (ref 27–31.2)
MCHC: 30.4 g/dL — AB (ref 31.8–35.4)
MCV: 94.6 fL (ref 80–97)
MID (cbc): 0.6 (ref 0–0.9)
MPV: 8.5 fL (ref 0–99.8)
POC Granulocyte: 5.3 (ref 2–6.9)
POC LYMPH PERCENT: 24.1 %L (ref 10–50)
POC MID %: 8.1 %M (ref 0–12)
Platelet Count, POC: 289 10*3/uL (ref 142–424)
RBC: 3.78 M/uL — AB (ref 4.69–6.13)
RDW, POC: 15.6 %
WBC: 7.8 10*3/uL (ref 4.6–10.2)

## 2013-01-31 LAB — LIPID PANEL
Cholesterol: 128 mg/dL (ref 0–200)
HDL: 41 mg/dL (ref >39)
LDL Cholesterol: 73 mg/dL (ref 0–99)
Total CHOL/HDL Ratio: 3.1 Ratio
Triglycerides: 68 mg/dL (ref <150)
VLDL: 14 mg/dL (ref 0–40)

## 2013-01-31 LAB — PSA: PSA: 2.36 ng/mL (ref <=4.00)

## 2013-01-31 NOTE — Progress Notes (Signed)
Patient ID: Kc Summerson MRN: [id number], DOB: April 14, 1956 57 y.o. Date of Encounter: 01/31/2013, 2:02 PM  Primary Physician: Tally Due, MD  Chief Complaint: Physical (CPE)  HPI: 57 y.o. y/o male with history noted below here for CPE.  Doing well. Has MR and schizophrenia  Review of Systems: Consitutional: No fever, chills, fatigue, night sweats, lymphadenopathy, or weight changes. Eyes: No visual changes, eye redness, or discharge. ENT/Mouth: Ears: No otalgia, tinnitus, hearing loss, discharge. Nose: No congestion, rhinorrhea, sinus pain, or epistaxis. Throat: No sore throat, post nasal drip, or teeth pain. Cardiovascular: No CP, palpitations, diaphoresis, DOE, edema, orthopnea, PND. Respiratory: No cough, hemoptysis, SOB, or wheezing. Gastrointestinal: No anorexia, dysphagia, reflux, pain, nausea, vomiting, hematemesis, diarrhea, constipation, BRBPR, or melena. Genitourinary: No dysuria, frequency, urgency, hematuria, incontinence, nocturia, decreased urinary stream, discharge, impotence, or testicular pain/masses. Musculoskeletal: No decreased ROM, myalgias, stiffness, joint swelling, or weakness. Skin: No rash, erythema, lesion changes, pain, warmth, jaundice, or pruritis. Neurological: No headache, dizziness, syncope, seizures, tremors, memory loss, coordination problems, or paresthesias. Psychological: No anxiety, depression,SI/HI. Endocrine: No fatigue, polydipsia, polyphagia, polyuria, or known diabetes. All other systems were reviewed and are otherwise negative.  Past Medical History  Diagnosis Date  . Schizophrenia   . Mental retardation   . Anemia      Past Surgical History  Procedure Laterality Date  . Abdominal hysterectomy      Home Meds:  Prior to Admission medications   Medication Sig Start Date End Date Taking? Authorizing Provider  benztropine (COGENTIN) 1 MG tablet Take 1 mg by mouth 3 (three) times daily.   Yes Historical Provider, MD    clonazePAM (KLONOPIN) 0.5 MG tablet Take 0.5 mg by mouth 2 (two) times daily.   Yes Historical Provider, MD  cloZAPine (CLOZARIL) 100 MG tablet Take 200-300 mg by mouth See admin instructions. Takes 200mg  every morning and 300mg  every night at bedtime   Yes Historical Provider, MD  PARoxetine (PAXIL) 20 MG tablet Take 20 mg by mouth every morning.   Yes Historical Provider, MD  polyethylene glycol (MIRALAX / GLYCOLAX) packet Take 17 g by mouth 2 (two) times daily.   Yes Historical Provider, MD    Allergies: No Known Allergies  History   Social History  . Marital Status: Single    Spouse Name: N/A    Number of Children: N/A  . Years of Education: N/A   Occupational History  . Not on file.   Social History Main Topics  . Smoking status: Current Every Day Smoker -- 0.25 packs/day for 30 years    Types: Cigarettes  . Smokeless tobacco: Never Used  . Alcohol Use: No  . Drug Use: Not on file  . Sexually Active: Not on file   Other Topics Concern  . Not on file   Social History Narrative  . No narrative on file    Family History  Problem Relation Age of Onset  . Cancer Mother     Physical Exam: Blood pressure 128/83, pulse 80, temperature 99.1 F (37.3 C), temperature source Oral, resp. rate 16, height 6' 0.5" (1.842 m), weight 169 lb 3.2 oz (76.749 kg), SpO2 98.00%.  General: Well developed, well nourished, in no acute distress. HEENT: Normocephalic, atraumatic. Conjunctiva pink, sclera non-icteric. Pupils 2 mm constricting to 1 mm, round, regular, and equally reactive to light and accomodation. EOMI. Internal auditory canal clear. TMs with good cone of light and without pathology. Nasal mucosa pink. Nares are without discharge. No sinus tenderness. Oral mucosa  pink. Dentition edentulous. Pharynx without exudate.   Neck: Supple. Trachea midline. No thyromegaly. Full ROM. No lymphadenopathy. Lungs: Clear to auscultation bilaterally without wheezes, rales, or rhonchi. Breathing  is of normal effort and unlabored. Cardiovascular: RRR with S1 S2. No murmurs, rubs, or gallops appreciated. Distal pulses 2+ symmetrically. No carotid or abdominal bruits Abdomen: Soft, non-tender, non-distended with normoactive bowel sounds. No hepatosplenomegaly or masses. No rebound/guarding. No CVA tenderness. Without hernias.  Rectal: No external hemorrhoids or fissures. Rectal vault without masses.  Genitourinary:  circumcised male. No penile lesions. Testes descended bilaterally, and smooth without tenderness or masses.  Musculoskeletal: Full range of motion and 5/5 strength throughout. Without swelling, atrophy, tenderness, crepitus, or warmth. Extremities without clubbing, cyanosis, or edema. Calves supple. Skin: Warm and moist without erythema, ecchymosis, wounds, or rash. Neuro: A+Ox3. CN II-XII grossly intact. Moves all extremities spontaneously. Full sensation throughout. Normal gait. DTR 2+ throughout upper and lower extremities. Finger to nose intact. Psych:  Responds to questions appropriately with a normal affect.    Assessment/Plan:  57 y.o. y/o  male here for CPE -Annual physical exam - Plan: POCT CBC, IFOBT POC (occult bld, rslt in office), POCT urinalysis dipstick, Comprehensive metabolic panel  High risk medications (not anticoagulants) long-term use - Plan: POCT CBC, IFOBT POC (occult bld, rslt in office), POCT urinalysis dipstick, Comprehensive metabolic panel  Psa,cmeet,lipids  Signed, Elvina Sidle, MD 01/31/2013 2:02 PM     Results for orders placed in visit on 01/31/13  POCT CBC      Result Value Range   WBC 7.8  4.6 - 10.2 K/uL   Lymph, poc 1.9  0.6 - 3.4   POC LYMPH PERCENT 24.1  10 - 50 %L   MID (cbc) 0.6  0 - 0.9   POC MID % 8.1  0 - 12 %M   POC Granulocyte 5.3  2 - 6.9   Granulocyte percent 67.8  37 - 80 %G   RBC 3.78 (*) 4.69 - 6.13 M/uL   Hemoglobin 10.9 (*) 14.1 - 18.1 g/dL   HCT, POC 16.1 (*) 09.6 - 53.7 %   MCV 94.6  80 - 97 fL   MCH,  POC 28.8  27 - 31.2 pg   MCHC 30.4 (*) 31.8 - 35.4 g/dL   RDW, POC 04.5     Platelet Count, POC 289  142 - 424 K/uL   MPV 8.5  0 - 99.8 fL  IFOBT (OCCULT BLOOD)      Result Value Range   IFOBT Negative

## 2013-02-02 ENCOUNTER — Ambulatory Visit (INDEPENDENT_AMBULATORY_CARE_PROVIDER_SITE_OTHER): Payer: Medicare Other | Admitting: Family Medicine

## 2013-02-02 VITALS — BP 124/76 | HR 55 | Temp 98.0°F | Resp 17 | Ht 74.0 in | Wt 172.0 lb

## 2013-02-02 DIAGNOSIS — D649 Anemia, unspecified: Secondary | ICD-10-CM

## 2013-02-02 DIAGNOSIS — D729 Disorder of white blood cells, unspecified: Secondary | ICD-10-CM

## 2013-02-02 LAB — POCT CBC
Granulocyte percent: 58.7 %G (ref 37–80)
MCV: 95 fL (ref 80–97)
MID (cbc): 0.7 (ref 0–0.9)
MPV: 8.1 fL (ref 0–99.8)
POC Granulocyte: 5.8 (ref 2–6.9)
POC LYMPH PERCENT: 33.9 %L (ref 10–50)
POC MID %: 7.4 %M (ref 0–12)
Platelet Count, POC: 340 10*3/uL (ref 142–424)
RBC: 4.2 M/uL — AB (ref 4.69–6.13)
RDW, POC: 14.6 %

## 2013-02-02 NOTE — Patient Instructions (Addendum)
We are making referral for you to a gastroenterologist to evaluate to be certain you are not losing any blood through your intestines. You do have some anemia. Your blood count was good in the past, but is low now.

## 2013-02-02 NOTE — Progress Notes (Signed)
Subjective: 57 year old man from a group home who is here. The assistant thought that he was supposed to get some blood tests it, but he wasn't certain exactly why he was here. No papers came with him. He was here 2 days ago had a physical exam by Dr. Elbert Ewings. I called Dr. Elbert Ewings., whose not sure why the patient is back here today. The patient's blood did show that he is anemic a few days ago, so I am repeating that in making a referral to GI.  Objective: The patient asked about his teeth. A look in his mouth. He has no teeth. His gums look okay.  Assessment: Anemia, etiology undetermined  Plan: Refer him to gastroenterology. Repeat the CBC today. Return if problems.  Results for orders placed in visit on 02/02/13  POCT CBC      Result Value Range   WBC 9.8  4.6 - 10.2 K/uL   Lymph, poc 3.3  0.6 - 3.4   POC LYMPH PERCENT 33.9  10 - 50 %L   MID (cbc) 0.7  0 - 0.9   POC MID % 7.4  0 - 12 %M   POC Granulocyte 5.8  2 - 6.9   Granulocyte percent 58.7  37 - 80 %G   RBC 4.20 (*) 4.69 - 6.13 M/uL   Hemoglobin 12.3 (*) 14.1 - 18.1 g/dL   HCT, POC 16.1 (*) 09.6 - 53.7 %   MCV 95.0  80 - 97 fL   MCH, POC 29.3  27 - 31.2 pg   MCHC 30.8 (*) 31.8 - 35.4 g/dL   RDW, POC 04.5     Platelet Count, POC 340  142 - 424 K/uL   MPV 8.1  0 - 99.8 fL

## 2013-02-03 ENCOUNTER — Encounter: Payer: Self-pay | Admitting: Gastroenterology

## 2013-02-03 LAB — FERRITIN: Ferritin: 196 ng/mL (ref 22–322)

## 2013-02-04 ENCOUNTER — Encounter: Payer: Self-pay | Admitting: Family Medicine

## 2013-02-24 ENCOUNTER — Ambulatory Visit: Payer: Medicare Other | Admitting: Gastroenterology

## 2014-02-20 ENCOUNTER — Ambulatory Visit (INDEPENDENT_AMBULATORY_CARE_PROVIDER_SITE_OTHER): Payer: Medicare Other | Admitting: Emergency Medicine

## 2014-02-20 VITALS — BP 128/82 | HR 90 | Temp 98.9°F | Resp 18 | Ht 73.5 in | Wt 176.4 lb

## 2014-02-20 DIAGNOSIS — Z125 Encounter for screening for malignant neoplasm of prostate: Secondary | ICD-10-CM

## 2014-02-20 DIAGNOSIS — Z139 Encounter for screening, unspecified: Secondary | ICD-10-CM

## 2014-02-20 DIAGNOSIS — F17209 Nicotine dependence, unspecified, with unspecified nicotine-induced disorders: Secondary | ICD-10-CM

## 2014-02-20 DIAGNOSIS — F17200 Nicotine dependence, unspecified, uncomplicated: Secondary | ICD-10-CM

## 2014-02-20 DIAGNOSIS — N400 Enlarged prostate without lower urinary tract symptoms: Secondary | ICD-10-CM

## 2014-02-20 DIAGNOSIS — Z131 Encounter for screening for diabetes mellitus: Secondary | ICD-10-CM

## 2014-02-20 DIAGNOSIS — K5900 Constipation, unspecified: Secondary | ICD-10-CM

## 2014-02-20 DIAGNOSIS — Z0000 Encounter for general adult medical examination without abnormal findings: Secondary | ICD-10-CM

## 2014-02-20 DIAGNOSIS — Z79899 Other long term (current) drug therapy: Secondary | ICD-10-CM

## 2014-02-20 LAB — POCT URINALYSIS DIPSTICK
Bilirubin, UA: NEGATIVE
Glucose, UA: NEGATIVE
Ketones, UA: NEGATIVE
Leukocytes, UA: NEGATIVE
Nitrite, UA: NEGATIVE
PH UA: 5.5
PROTEIN UA: NEGATIVE
SPEC GRAV UA: 1.02
UROBILINOGEN UA: 0.2

## 2014-02-20 LAB — POCT CBC
GRANULOCYTE PERCENT: 66.5 % (ref 37–80)
HCT, POC: 42.4 % — AB (ref 43.5–53.7)
Hemoglobin: 13.5 g/dL — AB (ref 14.1–18.1)
Lymph, poc: 2.6 (ref 0.6–3.4)
MCH, POC: 30.8 pg (ref 27–31.2)
MCHC: 31.8 g/dL (ref 31.8–35.4)
MCV: 96.7 fL (ref 80–97)
MID (CBC): 0.6 (ref 0–0.9)
MPV: 9.2 fL (ref 0–99.8)
PLATELET COUNT, POC: 295 10*3/uL (ref 142–424)
POC GRANULOCYTE: 6.3 (ref 2–6.9)
POC LYMPH %: 27.1 % (ref 10–50)
POC MID %: 6.4 % (ref 0–12)
RBC: 4.38 M/uL — AB (ref 4.69–6.13)
RDW, POC: 16.4 %
WBC: 9.5 10*3/uL (ref 4.6–10.2)

## 2014-02-20 LAB — COMPREHENSIVE METABOLIC PANEL
ALBUMIN: 4.1 g/dL (ref 3.5–5.2)
ALK PHOS: 92 U/L (ref 39–117)
ALT: 25 U/L (ref 0–53)
AST: 21 U/L (ref 0–37)
BUN: 14 mg/dL (ref 6–23)
CO2: 27 mEq/L (ref 19–32)
Calcium: 9.1 mg/dL (ref 8.4–10.5)
Chloride: 107 mEq/L (ref 96–112)
Creat: 1.11 mg/dL (ref 0.50–1.35)
Glucose, Bld: 81 mg/dL (ref 70–99)
POTASSIUM: 3.8 meq/L (ref 3.5–5.3)
SODIUM: 141 meq/L (ref 135–145)
TOTAL PROTEIN: 6.6 g/dL (ref 6.0–8.3)
Total Bilirubin: 0.4 mg/dL (ref 0.2–1.2)

## 2014-02-20 LAB — LIPID PANEL
Cholesterol: 122 mg/dL (ref 0–200)
HDL: 36 mg/dL — ABNORMAL LOW (ref >39)
LDL CALC: 64 mg/dL (ref 0–99)
TRIGLYCERIDES: 110 mg/dL (ref <150)
Total CHOL/HDL Ratio: 3.4 Ratio
VLDL: 22 mg/dL (ref 0–40)

## 2014-02-20 LAB — IFOBT (OCCULT BLOOD): IFOBT: NEGATIVE

## 2014-02-20 NOTE — Progress Notes (Signed)
[facility] [address], [place] [address] [phone]  Date:  02/20/2014   Name:  Jordan Kennedy   DOB:  1956/04/27   MRN:  [id number]  PCP:  Kennon Portela, MD    Chief Complaint: Annual Exam   History of Present Illness:  Jordan Kennedy is a 58 y.o. very pleasant male patient who presents with the following:  Patient is a resident of a group home with a history of mental retardation and schizophrenia.  Symptoms under control with medication.  Has no acute complaint.  The attendant assures he is up to date on his immunizations.    Patient Active Problem List   Diagnosis Date Noted  . Constipation 04/25/2012  . Leukocytosis, unspecified 04/25/2012  . Dehydration 04/25/2012  . Anxiety and depression 04/25/2012  . 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 Schizophrenia   . Mental retardation     Past Medical History  Diagnosis Date  . Schizophrenia   . Mental retardation   . Anemia     Past Surgical History  Procedure Laterality Date  . Abdominal hysterectomy      History  Substance Use Topics  . Smoking status: Current Every Day Smoker -- 0.25 packs/day for 30 years    Types: Cigarettes  . Smokeless tobacco: Never Used  . Alcohol Use: No    Family History  Problem Relation Age of Onset  . Cancer Mother     No Known Allergies  Medication list has been reviewed and updated.  Current Outpatient Prescriptions on File Prior to Visit  Medication Sig Dispense Refill  . benztropine (COGENTIN) 1 MG tablet Take 1 mg by mouth 3 (three) times daily.      . clonazePAM (KLONOPIN) 0.5 MG tablet Take 0.5 mg by mouth 2 (two) times daily.      . cloZAPine (CLOZARIL) 100 MG tablet Take 200-300 mg by mouth See admin instructions. Takes 200mg  every morning and 300mg  every night at bedtime      . PARoxetine (PAXIL) 20 MG tablet Take 20 mg by mouth every morning.      . polyethylene glycol (MIRALAX / GLYCOLAX) packet Take 17 g by mouth 2 (two) times daily.        No current facility-administered medications on file prior to visit.    Review of Systems:  As per HPI, otherwise negative.    Physical Examination: Filed Vitals:   02/20/14 1311  BP: 128/82  Pulse: 90  Temp: 98.9 F (37.2 C)  Resp: 18   Filed Vitals:   02/20/14 1311  Height: 6' 1.5" (1.867 m)  Weight: 176 lb 6.4 oz (80.015 kg)   Body mass index is 22.96 kg/(m^2). Ideal Body Weight: Weight in (lb) to have BMI = 25: 191.7  GEN: WDWN, NAD, Non-toxic, A & O x 3 HEENT: Atraumatic, Normocephalic. Neck supple. No masses, No LAD. Ears and Nose: No external deformity. CV: RRR, No M/G/R. No JVD. No thrill. No extra heart sounds. PULM: CTA B, no wheezes, crackles, rhonchi. No retractions. No resp. distress. No accessory muscle use. ABD: S, NT, ND, +BS. No rebound. No HSM. EXTR: No c/c/e NEURO Normal gait.  PSYCH: Normally interactive. Conversant. Not depressed or anxious appearing.  Calm demeanor.  DRE empty vault.  hemossure pending  Assessment and Plan: Annual wellness visit  Signed,  Ellison Carwin, MD

## 2014-02-22 LAB — PSA: PSA: 1.7 ng/mL (ref <=4.00)

## 2014-03-02 ENCOUNTER — Telehealth: Payer: Self-pay

## 2014-03-02 NOTE — Telephone Encounter (Signed)
[facility] called regarding paperwork they faxed [place] for a referral for patient. Please return call if we have received the fax and had the doctor sign off on the referral order. Thank you. CB# [phone]

## 2014-03-16 NOTE — Telephone Encounter (Signed)
Spoke with Roselyn Reef at [facility]. States she spoke with someone from our office and was told that the form had been sent directly to psychologist. The form scanned in was for [facility] and she said that was the same thing and it was taken care of.

## 2014-08-17 ENCOUNTER — Ambulatory Visit (INDEPENDENT_AMBULATORY_CARE_PROVIDER_SITE_OTHER): Payer: Medicare Other | Admitting: Family Medicine

## 2014-08-17 VITALS — BP 124/74 | HR 90 | Temp 97.4°F | Resp 18 | Ht 73.75 in | Wt 187.0 lb

## 2014-08-17 DIAGNOSIS — R350 Frequency of micturition: Secondary | ICD-10-CM

## 2014-08-17 LAB — POCT URINALYSIS DIPSTICK
Bilirubin, UA: NEGATIVE
Glucose, UA: NEGATIVE
KETONES UA: NEGATIVE
Leukocytes, UA: NEGATIVE
Nitrite, UA: NEGATIVE
PH UA: 5.5
PROTEIN UA: NEGATIVE
SPEC GRAV UA: 1.015
Urobilinogen, UA: 0.2

## 2014-08-17 LAB — POCT UA - MICROSCOPIC ONLY
Bacteria, U Microscopic: NEGATIVE
CASTS, UR, LPF, POC: NEGATIVE
CRYSTALS, UR, HPF, POC: NEGATIVE
MUCUS UA: NEGATIVE
YEAST UA: NEGATIVE

## 2014-08-17 MED ORDER — DOXYCYCLINE HYCLATE 100 MG PO TABS: 100.0000 mg | ORAL_TABLET | Freq: Two times a day (BID) | ORAL | Status: DC

## 2014-08-17 NOTE — Patient Instructions (Signed)
Urethritis °Urethritis is an inflammation of the tube through which urine exits your bladder (urethra).  °CAUSES °Urethritis is often caused by an infection in your urethra. The infection can be viral, like herpes. The infection can also be bacterial, like gonorrhea. °RISK FACTORS °Risk factors of urethritis include: °· Having sex without using a condom. °· Having multiple sexual partners. °· Having poor hygiene. °SIGNS AND SYMPTOMS °Symptoms of urethritis are less noticeable in women than in men. These symptoms include: °· Burning feeling when you urinate (dysuria). °· Discharge from your urethra. °· Blood in your urine (hematuria). °· Urinating more than usual. °DIAGNOSIS  °To confirm a diagnosis of urethritis, your health care provider will do the following: °· Ask about your sexual history. °· Perform a physical exam. °· Have you provide a sample of your urine for lab testing. °· Use a cotton swab to gently collect a sample from your urethra for lab testing. °TREATMENT  °It is important to treat urethritis. Depending on the cause, untreated urethritis may lead to serious genital infections and possibly infertility. Urethritis caused by a bacterial infection is treated with antibiotic medicine. All sexual partners must be treated.  °HOME CARE INSTRUCTIONS °· Do not have sex until the test results are known and treatment is completed, even if your symptoms go away before you finish treatment. °· If you were prescribed an antibiotic, finish it all even if you start to feel better. °SEEK MEDICAL CARE IF:  °· Your symptoms are not improved in 3 days. °· Your symptoms are getting worse. °· You develop abdominal pain or pelvic pain (in women). °· You develop joint pain. °· You have a fever. °SEEK IMMEDIATE MEDICAL CARE IF:  °· You have severe pain in the belly, back, or side. °· You have repeated vomiting. °MAKE SURE YOU: °· Understand these instructions. °· Will watch your condition. °· Will get help right away if you  are not doing well or get worse. °Document Released: 04/03/2001 Document Revised: 02/22/2014 Document Reviewed: 06/08/2013 °[employer]® Patient Information ©2015 [employer]. This information is not intended to replace advice given to you by your health care provider. Make sure you discuss any questions you have with your health care provider. ° °

## 2014-08-17 NOTE — Progress Notes (Signed)
Patient ID: Jordan Kennedy, male   DOB: 1956/06/29, 58 y.o.   MRN: [id number]  This chart was scribed for Robyn Haber, MD by Ladene Artist, ED Scribe. The patient was seen in room 9. Patient's care was started at 11:32 AM.  Patient ID: Jordan Kennedy MRN: [id number], DOB: [birth year], 58 y.o. Date of Encounter: 08/17/2014, 11:32 AM  Primary Physician: Kennon Portela, MD  Chief Complaint  Patient presents with  . Urinary Incontinence    x3 days  . Urinary Frequency    x3 days    HPI: 58 y.o. year old male with history below presents with urinary incontinence over the past 3 days. Pt states that he experienced an episode while he was riding the bus. He reports associated urinary frequency onset 3 days ago and dysuria onset 2 weeks ago that has resolved. Pt denies HA., trouble swallowing, penile discharge.   Pt's caregiver is with him during visit.   Past Medical History  Diagnosis Date  . Schizophrenia   . Mental retardation   . Anemia      Home Meds: Prior to Admission medications   Medication Sig Start Date End Date Taking? Authorizing Provider  benztropine (COGENTIN) 1 MG tablet Take 1 mg by mouth 3 (three) times daily.   Yes Historical Provider, MD  clonazePAM (KLONOPIN) 0.5 MG tablet Take 0.5 mg by mouth 2 (two) times daily.   Yes Historical Provider, MD  cloZAPine (CLOZARIL) 100 MG tablet Take 200-300 mg by mouth See admin instructions. Takes 200mg  every morning and 300mg  every night at bedtime   Yes Historical Provider, MD  PARoxetine (PAXIL) 20 MG tablet Take 20 mg by mouth every morning.   Yes Historical Provider, MD  polyethylene glycol (MIRALAX / GLYCOLAX) packet Take 17 g by mouth 2 (two) times daily.   Yes Historical Provider, MD    Allergies: No Known Allergies  History   Social History  . Marital Status: Single    Spouse Name: N/A    Number of Children: N/A  . Years of Education: N/A   Occupational History  . Not on file.   Social History Main  Topics  . Smoking status: Current Every Day Smoker -- 0.25 packs/day for 30 years    Types: Cigarettes  . Smokeless tobacco: Never Used  . Alcohol Use: No  . Drug Use: Not on file  . Sexual Activity: Not on file   Other Topics Concern  . Not on file   Social History Narrative  . No narrative on file     Review of Systems: Constitutional: negative for chills, fever, night sweats, weight changes, or fatigue  HEENT: negative for trouble swallowing, vision changes, hearing loss, congestion, rhinorrhea, ST, epistaxis, or sinus pressure Cardiovascular: negative for chest pain or palpitations Respiratory: negative for hemoptysis, wheezing, shortness of breath, or cough Abdominal: negative for abdominal pain, nausea, vomiting, diarrhea, or constipation GU: negative for penile discharge, +urinary incontinence, +urinary frequency, +dysuria Dermatological: negative for rash Neurologic: negative for headache, dizziness, or syncope All other systems reviewed and are otherwise negative with the exception to those above and in the HPI.   Physical Exam: Triage Vitals: Blood pressure 124/74, pulse 90, temperature 97.4 F (36.3 C), temperature source Oral, resp. rate 18, height 6' 1.75" (1.873 m), weight 187 lb (84.823 kg), SpO2 100.00%., Body mass index is 24.18 kg/(m^2). General: Well developed, well nourished, in no acute distress. Head: Normocephalic, atraumatic, eyes without discharge, sclera non-icteric, nares are without discharge. Bilateral auditory canals clear, TM's  are without perforation, pearly grey and translucent with reflective cone of light bilaterally. Oral cavity moist, posterior pharynx without exudate, erythema, peritonsillar abscess, or post nasal drip.  Neck: Supple. No thyromegaly. Full ROM. No lymphadenopathy. Lungs: Clear bilaterally to auscultation without wheezes, rales, or rhonchi. Breathing is unlabored. Heart: RRR with S1 S2. No murmurs, rubs, or gallops  appreciated. Abdomen: Soft, non-tender, non-distended with normoactive bowel sounds. No hepatomegaly. No rebound/guarding. No obvious abdominal masses. Msk:  Strength and tone normal for age. GU: Normal prostate.  Extremities/Skin: Warm and dry. No clubbing or cyanosis. No edema. No rashes or suspicious lesions. Neuro: Alert and oriented X 3. Moves all extremities spontaneously. Gait is normal. CNII-XII grossly in tact. Psych:  Responds to questions appropriately with a normal affect.   Labs:   ASSESSMENT AND PLAN:  58 y.o. year old male with  1. Urinary frequency    Urinary frequency - Plan: POCT urinalysis dipstick, POCT UA - Microscopic Only, doxycycline (VIBRA-TABS) 100 MG tablet   I personally performed the services described in this documentation, which was scribed in my presence. The recorded information has been reviewed and is accurate.   Signed, Robyn Haber, MD 08/17/2014 11:32 AM

## 2014-08-28 ENCOUNTER — Ambulatory Visit (INDEPENDENT_AMBULATORY_CARE_PROVIDER_SITE_OTHER): Payer: Medicare Other

## 2014-08-28 ENCOUNTER — Ambulatory Visit: Payer: Medicare Other

## 2014-08-28 DIAGNOSIS — Z23 Encounter for immunization: Secondary | ICD-10-CM

## 2014-11-04 ENCOUNTER — Ambulatory Visit (INDEPENDENT_AMBULATORY_CARE_PROVIDER_SITE_OTHER): Payer: Medicare Other | Admitting: Family Medicine

## 2014-11-04 VITALS — BP 128/88 | HR 95 | Temp 98.8°F | Resp 18 | Ht 74.0 in | Wt 200.0 lb

## 2014-11-04 DIAGNOSIS — R350 Frequency of micturition: Secondary | ICD-10-CM

## 2014-11-04 DIAGNOSIS — D729 Disorder of white blood cells, unspecified: Secondary | ICD-10-CM

## 2014-11-04 DIAGNOSIS — R8290 Unspecified abnormal findings in urine: Secondary | ICD-10-CM

## 2014-11-04 DIAGNOSIS — K068 Other specified disorders of gingiva and edentulous alveolar ridge: Secondary | ICD-10-CM

## 2014-11-04 DIAGNOSIS — R319 Hematuria, unspecified: Secondary | ICD-10-CM

## 2014-11-04 LAB — POCT CBC
Granulocyte percent: 60.3 % (ref 37–80)
HCT, POC: 41.4 % — AB (ref 43.5–53.7)
Hemoglobin: 13.1 g/dL — AB (ref 14.1–18.1)
Lymph, poc: 3.7 — AB (ref 0.6–3.4)
MCH, POC: 30.2 pg (ref 27–31.2)
MCHC: 15.8 g/dL — AB (ref 31.8–35.4)
MCV: 95.6 fL (ref 80–97)
MID (cbc): 0.6 (ref 0–0.9)
POC Granulocyte: 6.5 (ref 2–6.9)
POC LYMPH PERCENT: 34.4 % (ref 10–50)
POC MID %: 5.3 %M (ref 0–12)
Platelet Count, POC: 7 10*3/uL — AB (ref 142–424)
RBC: 4.33 M/uL — AB (ref 4.69–6.13)
RDW, POC: 292 %
WBC: 10.7 10*3/uL — AB (ref 4.6–10.2)

## 2014-11-04 LAB — POCT URINALYSIS DIPSTICK
Glucose, UA: NEGATIVE
Leukocytes, UA: NEGATIVE
Nitrite, UA: NEGATIVE
Protein, UA: 30
Spec Grav, UA: >=1.03
Urobilinogen, UA: 1
pH, UA: 5

## 2014-11-04 LAB — POCT UA - MICROSCOPIC ONLY
Casts, Ur, LPF, POC: NEGATIVE
Crystals, Ur, HPF, POC: NEGATIVE
Yeast, UA: NEGATIVE

## 2014-11-04 NOTE — Progress Notes (Signed)
Chief Complaint:  Chief Complaint  Patient presents with  . Lab    Pt. had a very high white blood cell count and was told to be seen immediately.     HPI: Jordan Kennedy is a 59 y.o. male who is here for recheck of his CBC. He is here because he had CBC where he gets his psych meds monthly and he had a WBC of 12.1 so they wanted that rechecked, His WBC is WNL today at 10.7 HE deneis any weight loss, coughing, SOB, hemoptysis, swollen galns, night sweats, fevers chill or consititutional sxs.   Wt Readings from Last 3 Encounters:  11/04/14 200 lb (90.719 kg)  08/17/14 187 lb (84.823 kg)  02/20/14 176 lb 6.4 oz (80.015 kg)   He is complaining of a lesion on his upper gum which ahs been there for several months, he can;t tell me exactly when, it bothers him.   He is here with one of his primary care providers fromt he gorup home, [name], and she states he ahs had worsening urianry frequence. His last PSA was normal. He has to go 3-4 times a night.  He has no DM, he was treated with doxycyclinein the past for possibe UTI but sxs still persists, he cannot tell me if his stream is weak, if ha dribbling etc due to his MR. He ahs ahd trace blood in his urin ein the past.  He has not had a colonascopy, but 2 prior Hemmocult cards were negative He   Past Medical History  Diagnosis Date  . Schizophrenia   . Mental retardation   . Anemia    History reviewed. No pertinent past surgical history. History   Social History  . Marital Status: Single    Spouse Name: N/A    Number of Children: N/A  . Years of Education: N/A   Social History Main Topics  . Smoking status: Current Every Day Smoker -- 0.25 packs/day for 30 years    Types: Cigarettes  . Smokeless tobacco: Never Used  . Alcohol Use: No  . Drug Use: None  . Sexual Activity: None   Other Topics Concern  . None   Social History Narrative   Family History  Problem Relation Age of Onset  . Cancer Mother    No  Known Allergies Prior to Admission medications   Medication Sig Start Date End Date Taking? Authorizing Provider  benztropine (COGENTIN) 1 MG tablet Take 1 mg by mouth 3 (three) times daily.   Yes Historical Provider, MD  clonazePAM (KLONOPIN) 0.5 MG tablet Take 0.5 mg by mouth 2 (two) times daily.   Yes Historical Provider, MD  cloZAPine (CLOZARIL) 100 MG tablet Take 200-300 mg by mouth See admin instructions. Takes 200mg  every morning and 300mg  every night at bedtime   Yes Historical Provider, MD  doxycycline (VIBRA-TABS) 100 MG tablet Take 1 tablet (100 mg total) by mouth 2 (two) times daily. 08/17/14  Yes Robyn Haber, MD  PARoxetine (PAXIL) 20 MG tablet Take 20 mg by mouth every morning.   Yes Historical Provider, MD  polyethylene glycol (MIRALAX / GLYCOLAX) packet Take 17 g by mouth 2 (two) times daily.   Yes Historical Provider, MD     ROS: The patient denies fevers, chills, night sweats, unintentional weight loss, chest pain, palpitations, wheezing, dyspnea on exertion, nausea, vomiting, abdominal pain, dysuria, hematuria, melena, numbness, weakness, or tingling.   All other systems have been reviewed and were otherwise negative with the  exception of those mentioned in the HPI and as above.    PHYSICAL EXAM: Filed Vitals:   11/04/14 1627  BP: 128/88  Pulse: 95  Temp: 98.8 F (37.1 C)  Resp: 18   Filed Vitals:   11/04/14 1627  Height: 6\' 2"  (1.88 m)  Weight: 200 lb (90.719 kg)   Body mass index is 25.67 kg/(m^2).  General: Alert, no acute distress HEENT:  Normocephalic, atraumatic, oropharynx patent. EOMI, PERRLA+ upper gum lesion about dime size , hypertrophic,nontender no dc Cardiovascular:  Regular rate and rhythm, no rubs murmurs or gallops.  No Carotid bruits, radial pulse intact. No pedal edema.  Respiratory: Clear to auscultation bilaterally.  No wheezes, rales, or rhonchi.  No cyanosis, no use of accessory musculature GI: No organomegaly, abdomen is soft and  non-tender, positive bowel sounds.  No masses. Skin: No rashes. Neurologic: Facial musculature symmetric. He is not appropriately cognitively for his age. He has some + tardive dyskinesic facial features Psychiatric: Patient is appropriate throughout our interaction. Lymphatic: No cervical lymphadenopathy Musculoskeletal: Gait intact.   LABS: Results for orders placed or performed in visit on 11/04/14  POCT CBC  Result Value Ref Range   WBC 10.7 (A) 4.6 - 10.2 K/uL   Lymph, poc 3.7 (A) 0.6 - 3.4   POC LYMPH PERCENT 34.4 10 - 50 %L   MID (cbc) 0.6 0 - 0.9   POC MID % 5.3 0 - 12 %M   POC Granulocyte 6.5 2 - 6.9   Granulocyte percent 60.3 37 - 80 %G   RBC 4.33 (A) 4.69 - 6.13 M/uL   Hemoglobin 13.1 (A) 14.1 - 18.1 g/dL   HCT, POC 41.4 (A) 43.5 - 53.7 %   MCV 95.6 80 - 97 fL   MCH, POC 30.2 27 - 31.2 pg   MCHC 15.8 (A) 31.8 - 35.4 g/dL   RDW, POC 292 %   Platelet Count, POC 7.0 (A) 142 - 424 K/uL   MPV  0 - 99.8 fL  POCT UA - Microscopic Only  Result Value Ref Range   WBC, Ur, HPF, POC 0-2    RBC, urine, microscopic 5-8    Bacteria, U Microscopic trace    Mucus, UA trace    Epithelial cells, urine per micros 0-2    Crystals, Ur, HPF, POC neg    Casts, Ur, LPF, POC neg    Yeast, UA neg   POCT urinalysis dipstick  Result Value Ref Range   Color, UA yellow    Clarity, UA clear    Glucose, UA neg    Bilirubin, UA small    Ketones, UA trace    Spec Grav, UA >=1.030    Blood, UA trace    pH, UA 5.0    Protein, UA 30    Urobilinogen, UA 1.0    Nitrite, UA neg    Leukocytes, UA Negative      EKG/XRAY:   Primary read interpreted by Dr. Marin Comment at [facility].   ASSESSMENT/PLAN: Encounter Diagnoses  Name Primary?  . Abnormal WBC count Yes  . Gum lesion   . Urinary frequency   . Hematuria    Pleasant 59 y/o male with MR, schizophrenia who is on multiple pysch meds who is here for recheck of cbc, was abnormal 1 week ago, today it is WNL He has had long standing urinary  frequency, today neg for  Infection, Will return urine sample in one week to recheck for microscopic blood, if persistent then send to  urology due to smoking hx Refer to  Oral surgeon for bx of lesion prn, no e/o infection so will not put him on abx Annual visit in May , can discuss options of colonscopy if needed This was d.w [name] , group home coordinator   Gross sideeffects, risk and benefits, and alternatives of medications d/w patient. Patient is aware that all medications have potential sideeffects and we are unable to predict every sideeffect or drug-drug interaction that may occur.  Nyari Olsson, [place], DO 11/05/2014 3:56 PM

## 2014-11-05 ENCOUNTER — Encounter: Payer: Self-pay | Admitting: Family Medicine

## 2014-11-11 LAB — POCT URINALYSIS DIPSTICK
Bilirubin, UA: NEGATIVE
Glucose, UA: NEGATIVE
Ketones, UA: NEGATIVE
Leukocytes, UA: NEGATIVE
Nitrite, UA: NEGATIVE
Spec Grav, UA: 1.02
Urobilinogen, UA: 0.2
pH, UA: 7

## 2014-11-11 LAB — POCT UA - MICROSCOPIC ONLY
Bacteria, U Microscopic: NEGATIVE
Casts, Ur, LPF, POC: NEGATIVE
Crystals, Ur, HPF, POC: NEGATIVE
Epithelial cells, urine per micros: NEGATIVE
Mucus, UA: NEGATIVE
Yeast, UA: NEGATIVE

## 2014-11-11 NOTE — Addendum Note (Signed)
Addended byWalden Field A on: 11/11/2014 02:45 PM   Modules accepted: Orders

## 2014-11-12 LAB — URINE CULTURE
Colony Count: NO GROWTH
Organism ID, Bacteria: NO GROWTH

## 2014-12-14 ENCOUNTER — Ambulatory Visit (INDEPENDENT_AMBULATORY_CARE_PROVIDER_SITE_OTHER): Payer: Medicare Other | Admitting: Family Medicine

## 2014-12-14 VITALS — BP 116/78 | HR 107 | Temp 98.8°F | Resp 16 | Ht 73.0 in | Wt 193.0 lb

## 2014-12-14 DIAGNOSIS — F209 Schizophrenia, unspecified: Secondary | ICD-10-CM

## 2014-12-14 DIAGNOSIS — F79 Unspecified intellectual disabilities: Secondary | ICD-10-CM | POA: Diagnosis not present

## 2014-12-14 NOTE — Progress Notes (Signed)
This chart was scribed for Delman Cheadle, MD by Lowella Petties, ED Scribe. The patient was seen in room 14. Patient's care was started at 9:27 AM.  Subjective:   Patient ID: Jordan Kennedy, male    DOB: 1956-10-21, 59 y.o.   MRN: [id number]  HPI  HPI Comments: Jordan Kennedy is a 59 y.o. male with a history of schizophrenia and mental retardation and who presents to the [facility] from a group home to have a physical. He states that he has a form that he needs filled out, and he denies any acute concerns at this time. He reports a history of dysuria and urinary frequency a few years ago that has since resolved. He denies constipation and diarrhea. He denies bowel incontinence. He states that he sees [place] for psychiatry and he denies any problems with his psychiatric medications. He had his flu shot in november and his pneumonia vaccine 23 in 2013.  Past Medical History  Diagnosis Date  . Schizophrenia   . Mental retardation   . Anemia    Current Outpatient Prescriptions on File Prior to Visit  Medication Sig Dispense Refill  . benztropine (COGENTIN) 1 MG tablet Take 1 mg by mouth 3 (three) times daily.    . clonazePAM (KLONOPIN) 0.5 MG tablet Take 0.5 mg by mouth 2 (two) times daily.    . cloZAPine (CLOZARIL) 100 MG tablet Take 200-300 mg by mouth See admin instructions. Takes 200mg  every morning and 300mg  every night at bedtime    . PARoxetine (PAXIL) 20 MG tablet Take 20 mg by mouth every morning.    . polyethylene glycol (MIRALAX / GLYCOLAX) packet Take 17 g by mouth 2 (two) times daily.     No current facility-administered medications on file prior to visit.   No Known Allergies  Review of Systems  Constitutional: Negative for fever and chills.  HENT: Negative for congestion and sore throat.   Respiratory: Negative for shortness of breath and wheezing.   Cardiovascular: Negative for chest pain.  Gastrointestinal: Negative for nausea, vomiting, diarrhea and  constipation.  Genitourinary: Negative for dysuria and frequency.  Skin: Negative for rash.  Neurological: Negative for headaches.   BP 116/78 mmHg  Pulse 107  Temp(Src) 98.8 F (37.1 C) (Oral)  Resp 16  Ht 6\' 1"  (1.854 m)  Wt 193 lb (87.544 kg)  BMI 25.47 kg/m2  SpO2 98%  Objective:  Physical Exam  Constitutional: He is oriented to person, place, and time. He appears well-developed and well-nourished. No distress.  HENT:  Head: Normocephalic and atraumatic.  Eyes: Conjunctivae and EOM are normal. Pupils are equal, round, and reactive to light.  Neck: Neck supple. No tracheal deviation present.  Cardiovascular: Normal rate, regular rhythm and normal heart sounds.   No murmur heard. Pulmonary/Chest: Effort normal and breath sounds normal. No respiratory distress. He has no wheezes. He has no rales. He exhibits no tenderness.  Musculoskeletal: Normal range of motion.  Neurological: He is alert and oriented to person, place, and time.  Skin: Skin is warm and dry.  Psychiatric: He has a normal mood and affect. His behavior is normal.  Nursing note and vitals reviewed.   Assessment & Plan:  Schizophrenia, unspecified type  Mental retardation [place]-2 papers signed today so that pt can continue in medical group home under medicare/medicaid.  No acute concerns. RTC prn and in 1 yr for repeat orders.  I personally performed the services described in this documentation, which was scribed in  my presence. The recorded information has been reviewed and considered, and addended by me as needed.  Delman Cheadle, MD MPH

## 2015-01-31 ENCOUNTER — Ambulatory Visit (INDEPENDENT_AMBULATORY_CARE_PROVIDER_SITE_OTHER): Payer: Medicare Other | Admitting: Family Medicine

## 2015-01-31 ENCOUNTER — Encounter: Payer: Self-pay | Admitting: Family Medicine

## 2015-01-31 VITALS — BP 116/69 | HR 108 | Temp 98.6°F | Resp 16 | Ht 73.5 in | Wt 197.0 lb

## 2015-01-31 DIAGNOSIS — Z1211 Encounter for screening for malignant neoplasm of colon: Secondary | ICD-10-CM | POA: Diagnosis not present

## 2015-01-31 DIAGNOSIS — R350 Frequency of micturition: Secondary | ICD-10-CM

## 2015-01-31 DIAGNOSIS — D649 Anemia, unspecified: Secondary | ICD-10-CM

## 2015-01-31 DIAGNOSIS — Z0000 Encounter for general adult medical examination without abnormal findings: Secondary | ICD-10-CM | POA: Diagnosis not present

## 2015-01-31 DIAGNOSIS — F79 Unspecified intellectual disabilities: Secondary | ICD-10-CM

## 2015-01-31 DIAGNOSIS — Z125 Encounter for screening for malignant neoplasm of prostate: Secondary | ICD-10-CM | POA: Diagnosis not present

## 2015-01-31 DIAGNOSIS — Z720 Tobacco use: Secondary | ICD-10-CM | POA: Diagnosis not present

## 2015-01-31 DIAGNOSIS — F209 Schizophrenia, unspecified: Secondary | ICD-10-CM

## 2015-01-31 DIAGNOSIS — R319 Hematuria, unspecified: Secondary | ICD-10-CM

## 2015-01-31 DIAGNOSIS — K5901 Slow transit constipation: Secondary | ICD-10-CM | POA: Diagnosis not present

## 2015-01-31 DIAGNOSIS — R21 Rash and other nonspecific skin eruption: Secondary | ICD-10-CM

## 2015-01-31 LAB — IRON: IRON: 65 ug/dL (ref 42–165)

## 2015-01-31 LAB — POCT URINALYSIS DIPSTICK
Bilirubin, UA: NEGATIVE
GLUCOSE UA: NEGATIVE
Ketones, UA: NEGATIVE
LEUKOCYTES UA: NEGATIVE
NITRITE UA: NEGATIVE
Protein, UA: NEGATIVE
Spec Grav, UA: 1.01
UROBILINOGEN UA: 0.2
pH, UA: 6

## 2015-01-31 LAB — POCT UA - MICROSCOPIC ONLY
Bacteria, U Microscopic: NEGATIVE
CASTS, UR, LPF, POC: NEGATIVE
Crystals, Ur, HPF, POC: NEGATIVE
EPITHELIAL CELLS, URINE PER MICROSCOPY: NEGATIVE
MUCUS UA: NEGATIVE

## 2015-01-31 LAB — IBC PANEL
%SAT: 20 % (ref 20–55)
TIBC: 326 ug/dL (ref 215–435)
UIBC: 261 ug/dL (ref 125–400)

## 2015-01-31 LAB — IFOBT (OCCULT BLOOD): IFOBT: NEGATIVE

## 2015-01-31 MED ORDER — POLYETHYLENE GLYCOL 3350 17 GM/SCOOP PO POWD: 17.0000 g | Freq: Every day | ORAL | Status: DC

## 2015-01-31 MED ORDER — KETOCONAZOLE 2 % EX CREA: 1.0000 "application " | TOPICAL_CREAM | Freq: Two times a day (BID) | CUTANEOUS | Status: DC

## 2015-01-31 MED ORDER — ASPIRIN 81 MG PO TABS: 81.0000 mg | ORAL_TABLET | Freq: Every day | ORAL | Status: DC

## 2015-01-31 NOTE — Progress Notes (Signed)
Subjective:    Patient ID: Jordan Kennedy, male    DOB: [birth year], 59 y.o.   MRN: [id number]  01/31/2015  Annual Exam; Constipation; Urinary Frequency; and Hematuria   HPI This 59 y.o. male presents for Medicare Annual Wellness Examination.  Last physical: 02-20-14 Colonoscopy: never; stool cards performed last year and negative. TDAP: 03-30-2009 Pneumovax:  03-30-2009 Zostavax: never Influenza:  08-28-2014 Eye exam:  No glasses; not sure of last eye exam. 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  11/2014.  S/p resection of all teeth.     Constipation:  Previously on Miralax; having bowel movement every two weeks.  When does have bowel movement, has large bowel movement that causes plumbing issues. Eats well.  No bloody stools or melena.  Rash on B legs: no itching; caregiver not sure how long rash has been present.  Urinary frequency: evaluated in 07/2014 for urinary frequency and intermittent incontinence.  Treated with Doxycycline at that time for three days of acutely worsening symptoms. Caregiver/Angela reports that patient urinates frequently during the day; will intermittently have accidents/incontinence. Does not need to wear Depends at this point. Urine normal in 07/2014 other than 2-5 RBC microscopically.  11/04/14 u/a with trace blood with 5-8 RBCs present.  Urine culture negative at that visit.  PSA last year of 1.7; PSA two years ago of 2.36.    Anemia: mild anemia has been present for past year; Hgb has ranged from 12.3 to 13.1.  No evidence of bloody stools, melena.  Schizophrenia with MR: followed by [facility] every three months; no behavior issues; emotionally stable.  No family involvement or support.  Review of Systems  Constitutional: Negative for fever, chills, diaphoresis, activity change, appetite change, fatigue and unexpected weight change.  HENT: Negative for congestion, dental problem, drooling, ear  discharge, ear pain, facial swelling, hearing loss, mouth sores, nosebleeds, postnasal drip, rhinorrhea, sinus pressure, sneezing, sore throat, tinnitus, trouble swallowing and voice change.   Eyes: Negative for photophobia, pain, discharge, redness, itching and visual disturbance.  Respiratory: Negative for apnea, cough, choking, chest tightness, shortness of breath, wheezing and stridor.   Cardiovascular: Negative for chest pain, palpitations and leg swelling.  Gastrointestinal: Positive for constipation. Negative for nausea, vomiting, abdominal pain, diarrhea and blood in stool.  Endocrine: Negative for cold intolerance, heat intolerance, polydipsia, polyphagia and polyuria.  Genitourinary: Positive for frequency. Negative for dysuria, urgency, hematuria, flank pain, decreased urine volume, discharge, penile swelling, scrotal swelling, enuresis, difficulty urinating, genital sores, penile pain and testicular pain.  Musculoskeletal: Negative for myalgias, back pain, joint swelling, arthralgias, gait problem, neck pain and neck stiffness.  Skin: Negative for color change, pallor, rash and wound.  Allergic/Immunologic: Negative for environmental allergies, food allergies and immunocompromised state.  Neurological: Negative for dizziness, tremors, seizures, syncope, facial asymmetry, speech difficulty, weakness, light-headedness, numbness and headaches.  Hematological: Negative for adenopathy. Does not bruise/bleed easily.  Psychiatric/Behavioral: Negative for suicidal ideas, hallucinations, behavioral problems, confusion, sleep disturbance, self-injury, dysphoric mood, decreased concentration and agitation. The patient is not nervous/anxious and is not hyperactive.     Past Medical History  Diagnosis Date  . Schizophrenia     Monarche every three months;   . Mental retardation   . Anemia    History reviewed. No pertinent past surgical history. No Known Allergies History   Social History  .  Marital Status: Single    Spouse Name: N/A  . Number of Children: N/A  . Years  of Education: N/A   Occupational History  . Not on file.   Social History Main Topics  . Smoking status: Current Every Day Smoker -- 0.25 packs/day for 30 years    Types: Cigarettes  . Smokeless tobacco: Never Used  . Alcohol Use: No  . Drug Use: No  . Sexual Activity: No   Other Topics Concern  . Not on file   Social History Narrative   Marital status: single      Children: none       Employment: disability due to mental retardation and schizophrenia.      Lives: group home [facility]; [facility]; 3 men in group home.        Tobacco: six cigarettes per day; since age 35.      Alcohol:  None      Drugs: none      Exercise:  Walking some.      Seatbelt:  100%      Family History  Problem Relation Age of Onset  . Cancer Mother     unknown type        Objective:    BP 116/69 mmHg  Pulse 108  Temp(Src) 98.6 F (37 C)  Resp 16  Ht 6' 1.5" (1.867 m)  Wt 197 lb (89.359 kg)  BMI 25.64 kg/m2  SpO2 98% Physical Exam  Constitutional: He is oriented to person, place, and time. He appears well-developed and well-nourished. No distress.  HENT:  Head: Normocephalic and atraumatic.  Right Ear: External ear normal.  Left Ear: External ear normal.  Nose: Nose normal.  Mouth/Throat: Oropharynx is clear and moist.  Eyes: Conjunctivae and EOM are normal. Pupils are equal, round, and reactive to light.  Neck: Normal range of motion. Neck supple. Carotid bruit is not present. No thyromegaly present.  Cardiovascular: Normal rate, regular rhythm, normal heart sounds and intact distal pulses.  Exam reveals no gallop and no friction rub.   No murmur heard. Pulmonary/Chest: Effort normal and breath sounds normal. He has no wheezes. He has no rales.  Abdominal: Soft. Bowel sounds are normal. He exhibits no distension and no mass. There is no tenderness. There is no rebound and no  guarding. Hernia confirmed negative in the right inguinal area and confirmed negative in the left inguinal area.  Genitourinary: Rectum normal, prostate normal, testes normal and penis normal. Circumcised.  Musculoskeletal:       Right shoulder: Normal.       Left shoulder: Normal.       Cervical back: Normal.  Lymphadenopathy:    He has no cervical adenopathy.       Right: No inguinal adenopathy present.       Left: No inguinal adenopathy present.  Neurological: He is alert and oriented to person, place, and time. He has normal reflexes. No cranial nerve deficit. He exhibits normal muscle tone. Coordination normal.  Skin: Skin is warm and dry. Rash noted. He is not diaphoretic.     Scaling annular dry rash 1 cm diameter multiple lesions B lower legs.  Psychiatric: He has a normal mood and affect. His behavior is normal. His speech is delayed.   Results for orders placed or performed in visit on 01/31/15  Iron  Result Value Ref Range   Iron 65 42 - 165 ug/dL  IBC panel  Result Value Ref Range   UIBC 261 125 - 400 ug/dL   TIBC 326 215 - 435 ug/dL   %SAT 20 20 -  55 %  PSA  Result Value Ref Range   PSA 2.06 <=4.00 ng/mL  IFOBT POC (occult bld, rslt in office)  Result Value Ref Range   IFOBT Negative   POCT urinalysis dipstick  Result Value Ref Range   Color, UA Yellow    Clarity, UA Clear    Glucose, UA Negative    Bilirubin, UA Negative    Ketones, UA Negative    Spec Grav, UA 1.010    Blood, UA Small    pH, UA 6.0    Protein, UA Negative    Urobilinogen, UA 0.2    Nitrite, UA Negative    Leukocytes, UA Negative   POCT UA - Microscopic Only  Result Value Ref Range   WBC, Ur, HPF, POC 0-3    RBC, urine, microscopic 5-7    Bacteria, U Microscopic Negative    Mucus, UA Negative    Epithelial cells, urine per micros Negative    Crystals, Ur, HPF, POC Negative    Casts, Ur, LPF, POC Negative    Yeast, UA         Assessment & Plan:   1. Medicare annual wellness  visit, subsequent   2. Prostate cancer screening   3. Anemia, unspecified anemia type   4. Slow transit constipation   5. Rash and nonspecific skin eruption   6. Colon cancer screening   7. Hematuria   8. Urinary frequency   9. Schizophrenia, unspecified type   10. Mental retardation   11. Tobacco abuse     1. Annual Wellness Examination: anticipatory guidance --- exercise, weight maintenance.  Hemosure negative; not a candidate for colonoscopy due to MR.  Immunizations reviewed and UTD.   2.  Prostate cancer screening: DRE completed; obtain PSA. 3.  Anemia: chronic; repeat today; obtain iron studies. 4.  Constipation: uncontrolled; restart Miralax once daily. 5.  Rash B lower extremities: New. Rx for Ketoconazole provided.  If no improvement in three weeks, will add hydrocortisone ointment 2.5% bid. 6.  Colon cancer screening: DRE completed; hemosure negative in office; not good candidate for colonoscopy due to MR. 7.  Hematuria microscopic: persistent; refer to urology for further evaluation. 8.  Urinary frequency: persistent; urine culture negative in previous visits; obtain PSA.  Likely secondary to BPH. 9. Schizophrenia: stable; followed/managed by [facility]. 10. Mental Retardation: stable; doing well in current group home.  No family involvement. 11. Tobacco abuse: encouraged cessation during visit.   Meds ordered this encounter  Medications  . polyethylene glycol powder (GLYCOLAX/MIRALAX) powder    Sig: Take 17 g by mouth daily.    Dispense:  3350 g    Refill:  1  . ketoconazole (NIZORAL) 2 % cream    Sig: Apply 1 application topically 2 (two) times daily. Apply to bilateral feet and lower legs for two weeks.    Dispense:  60 g    Refill:  0  . aspirin 81 MG tablet    Sig: Take 1 tablet (81 mg total) by mouth daily.    Dispense:  100 tablet    Refill:  3    Return in about 1 year (around 01/31/2016) for complete physical examiniation.     Deloria Brassfield Elayne Guerin, M.D. [facility] [address] [place], [place]  [id number] [phone] phone [phone] fax

## 2015-01-31 NOTE — Patient Instructions (Signed)

## 2015-02-01 DIAGNOSIS — Z720 Tobacco use: Secondary | ICD-10-CM | POA: Insufficient documentation

## 2015-02-01 DIAGNOSIS — F17201 Nicotine dependence, unspecified, in remission: Secondary | ICD-10-CM | POA: Insufficient documentation

## 2015-02-01 LAB — PSA: PSA: 2.06 ng/mL (ref <=4.00)

## 2015-02-05 ENCOUNTER — Telehealth: Payer: Self-pay

## 2015-02-05 NOTE — Telephone Encounter (Signed)
Pt was just here for a CPE with Dr.Smith, his facility needs a form filled out. I placed the form in the box at the clinical TL station. Please call Kenney Houseman [phone] when form complete. Thank you

## 2015-02-05 NOTE — Telephone Encounter (Signed)
Dr. Tamala Julian, in your box.

## 2015-02-06 NOTE — Telephone Encounter (Signed)
Jordan Kennedy stopped in and called to see if form was done, I explained that is may take a few days to get it completed. She needs it by [facility] morning 02/07/15. Please call [phone] when ready. Thank you

## 2015-02-07 NOTE — Telephone Encounter (Signed)
Kenney Houseman came by with patient to see if the ppw was ready.  I found it completed in the nurses box, made a copy to scan and gave them the original.

## 2015-02-07 NOTE — Addendum Note (Signed)
Addended by: Kem Boroughs D on: 02/07/2015 03:25 PM   Modules accepted: Orders

## 2015-02-08 NOTE — Addendum Note (Signed)
Addended by: Wardell Honour on: 02/08/2015 11:49 AM   Modules accepted: Orders

## 2015-05-16 ENCOUNTER — Ambulatory Visit: Payer: Medicare Other | Admitting: Family Medicine

## 2015-05-25 ENCOUNTER — Encounter: Payer: Self-pay | Admitting: Family Medicine

## 2015-05-25 ENCOUNTER — Telehealth: Payer: Self-pay | Admitting: *Deleted

## 2015-05-25 ENCOUNTER — Ambulatory Visit (INDEPENDENT_AMBULATORY_CARE_PROVIDER_SITE_OTHER): Payer: Medicare Other | Admitting: Family Medicine

## 2015-05-25 VITALS — BP 114/60 | HR 100 | Temp 98.7°F | Resp 16 | Ht 73.0 in | Wt 206.6 lb

## 2015-05-25 DIAGNOSIS — K5901 Slow transit constipation: Secondary | ICD-10-CM

## 2015-05-25 DIAGNOSIS — Z5181 Encounter for therapeutic drug level monitoring: Secondary | ICD-10-CM | POA: Diagnosis not present

## 2015-05-25 DIAGNOSIS — N400 Enlarged prostate without lower urinary tract symptoms: Secondary | ICD-10-CM | POA: Diagnosis not present

## 2015-05-25 DIAGNOSIS — D649 Anemia, unspecified: Secondary | ICD-10-CM | POA: Diagnosis not present

## 2015-05-25 DIAGNOSIS — R21 Rash and other nonspecific skin eruption: Secondary | ICD-10-CM | POA: Diagnosis not present

## 2015-05-25 LAB — CBC WITH DIFFERENTIAL/PLATELET
BASOS ABS: 0 10*3/uL (ref 0.0–0.1)
BASOS PCT: 0 % (ref 0–1)
EOS PCT: 1 % (ref 0–5)
Eosinophils Absolute: 0.1 10*3/uL (ref 0.0–0.7)
HEMATOCRIT: 40.8 % (ref 39.0–52.0)
Hemoglobin: 13.6 g/dL (ref 13.0–17.0)
Lymphocytes Relative: 21 % (ref 12–46)
Lymphs Abs: 2.1 10*3/uL (ref 0.7–4.0)
MCH: 30.8 pg (ref 26.0–34.0)
MCHC: 33.3 g/dL (ref 30.0–36.0)
MCV: 92.5 fL (ref 78.0–100.0)
MONO ABS: 0.8 10*3/uL (ref 0.1–1.0)
MONOS PCT: 8 % (ref 3–12)
MPV: 10.6 fL (ref 8.6–12.4)
Neutro Abs: 7 10*3/uL (ref 1.7–7.7)
Neutrophils Relative %: 70 % (ref 43–77)
PLATELETS: 300 10*3/uL (ref 150–400)
RBC: 4.41 MIL/uL (ref 4.22–5.81)
RDW: 14.7 % (ref 11.5–15.5)
WBC: 10 10*3/uL (ref 4.0–10.5)

## 2015-05-25 LAB — COMPREHENSIVE METABOLIC PANEL
ALK PHOS: 91 U/L (ref 40–115)
ALT: 20 U/L (ref 9–46)
AST: 19 U/L (ref 10–35)
Albumin: 4.1 g/dL (ref 3.6–5.1)
BUN: 13 mg/dL (ref 7–25)
CHLORIDE: 107 mmol/L (ref 98–110)
CO2: 26 mmol/L (ref 20–31)
Calcium: 9.4 mg/dL (ref 8.6–10.3)
Creat: 1.1 mg/dL (ref 0.70–1.33)
Glucose, Bld: 119 mg/dL — ABNORMAL HIGH (ref 65–99)
Potassium: 4.1 mmol/L (ref 3.5–5.3)
Sodium: 141 mmol/L (ref 135–146)
Total Bilirubin: 0.4 mg/dL (ref 0.2–1.2)
Total Protein: 6.9 g/dL (ref 6.1–8.1)

## 2015-05-25 MED ORDER — KETOCONAZOLE 2 % EX CREA: 1.0000 "application " | TOPICAL_CREAM | Freq: Every day | CUTANEOUS | Status: DC

## 2015-05-25 MED ORDER — POLYETHYLENE GLYCOL 3350 17 GM/SCOOP PO POWD: 17.0000 g | Freq: Two times a day (BID) | ORAL | Status: DC

## 2015-05-25 MED ORDER — TRIAMCINOLONE 0.1 % CREAM:EUCERIN CREAM 1:1: 1.0000 "application " | TOPICAL_CREAM | Freq: Every day | CUTANEOUS | Status: DC

## 2015-05-25 NOTE — Progress Notes (Signed)
Subjective:    Patient ID: Jordan Kennedy, male    DOB: May 26, 1956, 59 y.o.   MRN: [id number]  05/25/2015  Follow-up; rash; constipation; urinary symptoms; and Anemia   HPI This 59 y.o. male presents with caregiver/Angela for three month follow-up:  1.  Feet Rash:   Used antifungal cream for two weeks bid. Improvement in rash.  No itching. Has dry skin.  2.  Anemia:  Due for Hgb recheck. Denies bloody stools or black stools.  3. Constipation:  Started back Miralax once daily; bowels moving much better; going once per week; now going once per week.  Not monitoring at day program.  Caregiver can tell when bloated; large bowel movements.  Noticed last week that stomach bloated.    4.  BPH:  Diagnosed enlarged prostate; started Flomax; follow-up in one year.  Sleeping all night; right before going to bed; going to restroom.  No enuresis in last 4 weeks.  Was urinating on self and having b.m. On self.  Now is fine.  Started Flomax June.     Review of Systems  Constitutional: Negative for fever, chills, diaphoresis, activity change, appetite change and fatigue.  Respiratory: Negative for cough and shortness of breath.   Cardiovascular: Negative for chest pain, palpitations and leg swelling.  Gastrointestinal: Positive for constipation. Negative for nausea, vomiting, abdominal pain, diarrhea, blood in stool, abdominal distention, anal bleeding and rectal pain.  Endocrine: Negative for cold intolerance, heat intolerance, polydipsia, polyphagia and polyuria.  Genitourinary: Negative for urgency and decreased urine volume.  Skin: Positive for rash. Negative for color change and wound.  Neurological: Negative for dizziness, tremors, seizures, syncope, facial asymmetry, speech difficulty, weakness, light-headedness, numbness and headaches.  Psychiatric/Behavioral: Negative for sleep disturbance and dysphoric mood. The patient is not nervous/anxious.     Past Medical History  Diagnosis Date  .  Schizophrenia     Monarche every three months;   . Mental retardation   . Anemia   . BPH (benign prostatic hyperplasia)    History reviewed. No pertinent past surgical history. No Known Allergies Current Outpatient Prescriptions  Medication Sig Dispense Refill  . aspirin 81 MG tablet Take 1 tablet (81 mg total) by mouth daily. 100 tablet 3  . benztropine (COGENTIN) 1 MG tablet Take 1 mg by mouth 3 (three) times daily.    . clonazePAM (KLONOPIN) 0.5 MG tablet Take 0.5 mg by mouth 2 (two) times daily.    . cloZAPine (CLOZARIL) 100 MG tablet Take 200-300 mg by mouth See admin instructions. Takes 200mg  every morning and 300mg  every night at bedtime    . PARoxetine (PAXIL) 20 MG tablet Take 20 mg by mouth every morning.    . polyethylene glycol powder (GLYCOLAX/MIRALAX) powder Take 17 g by mouth 2 (two) times daily. 3350 g prn  . tamsulosin (FLOMAX) 0.4 MG CAPS capsule Take 0.4 mg by mouth.    . docusate sodium (COLACE) 100 MG capsule Take 1 capsule (100 mg total) by mouth 2 (two) times daily. 60 capsule 5  . ketoconazole (NIZORAL) 2 % cream Apply 1 application topically daily. Apply to bilateral feet and lower legs every morning for one month 60 g 1  . Triamcinolone Acetonide (TRIAMCINOLONE 0.1 % CREAM : EUCERIN) CREA Apply 1 application topically at bedtime. 1 each 1   No current facility-administered medications for this visit.   Social History   Social History  . Marital Status: Single    Spouse Name: N/A  . Number of Children: N/A  .  Years of Education: N/A   Occupational History  . Not on file.   Social History Main Topics  . Smoking status: Current Every Day Smoker -- 0.25 packs/day for 30 years    Types: Cigarettes  . Smokeless tobacco: Never Used  . Alcohol Use: No  . Drug Use: No  . Sexual Activity: No   Other Topics Concern  . Not on file   Social History Narrative   Marital status: single      Children: none       Employment: disability due to mental retardation  and schizophrenia.      Lives: group home [place]; [facility]; 3 men in group home.        Tobacco: six cigarettes per day; since age 30.      Alcohol:  None      Drugs: none      Exercise:  Walking some.      Seatbelt:  100%      Family History  Problem Relation Age of Onset  . Cancer Mother     unknown type       Objective:    BP 114/60 mmHg  Pulse 100  Temp(Src) 98.7 F (37.1 C) (Oral)  Resp 16  Ht 6\' 1"  (1.854 m)  Wt 206 lb 9.6 oz (93.713 kg)  BMI 27.26 kg/m2  SpO2 99% Physical Exam  Constitutional: He is oriented to person, place, and time. He appears well-developed and well-nourished. No distress.  HENT:  Head: Normocephalic and atraumatic.  Right Ear: External ear normal.  Left Ear: External ear normal.  Nose: Nose normal.  Mouth/Throat: Oropharynx is clear and moist.  Eyes: Conjunctivae and EOM are normal. Pupils are equal, round, and reactive to light.  Neck: Normal range of motion. Neck supple. Carotid bruit is not present. No thyromegaly present.  Cardiovascular: Normal rate, regular rhythm, normal heart sounds and intact distal pulses.  Exam reveals no gallop and no friction rub.   No murmur heard. Pulmonary/Chest: Effort normal and breath sounds normal. He has no wheezes. He has no rales.  Abdominal: Soft. Bowel sounds are normal. He exhibits distension. He exhibits no mass. There is no tenderness. There is no rebound and no guarding.  Lymphadenopathy:    He has no cervical adenopathy.  Neurological: He is alert and oriented to person, place, and time. No cranial nerve deficit.  Skin: Skin is warm and dry. No rash noted. He is not diaphoretic.  Psychiatric: He has a normal mood and affect. His behavior is normal.  Nursing note and vitals reviewed.       Assessment & Plan:   1. Anemia, unspecified anemia type   2. Encounter for therapeutic drug monitoring   3. Rash and nonspecific skin eruption   4. Slow transit  constipation   5. BPH (benign prostatic hyperplasia)     1. Anemia: New. Repeat today.  Asymptomatic. 2.  Rash B feet: improved but persistent; rx for Triamcinolone-eucerin cream to use at bedtime.  Rx for Ketoconazole cream to apply every morning. 3.  Constipation: improved but persistent; increase Miralax to bid. 4.  BPH: New.  S/p urology evaluation; started on Flomax. 5. Encounter for drug monitoring: obtain CBC, CMET.   Meds ordered this encounter  Medications  . tamsulosin (FLOMAX) 0.4 MG CAPS capsule    Sig: Take 0.4 mg by mouth.  . polyethylene glycol powder (GLYCOLAX/MIRALAX) powder    Sig: Take 17 g by mouth 2 (two) times daily.  Dispense:  3350 g    Refill:  prn  . Triamcinolone Acetonide (TRIAMCINOLONE 0.1 % CREAM : EUCERIN) CREA    Sig: Apply 1 application topically at bedtime.    Dispense:  1 each    Refill:  1  . ketoconazole (NIZORAL) 2 % cream    Sig: Apply 1 application topically daily. Apply to bilateral feet and lower legs every morning for one month    Dispense:  60 g    Refill:  1    Return in about 3 months (around 08/25/2015) for recheck.     [place] Elayne Guerin, M.D. [facility] [address] [place], [place]  [address] [phone] phone [phone] fax

## 2015-05-25 NOTE — Telephone Encounter (Signed)
Faxed prescription for Triamcinolone cream 0.1% to patient's pharmacy, per Dr Tamala Julian. Confirmation page received at 11:00 am.

## 2015-05-30 ENCOUNTER — Telehealth: Payer: Self-pay

## 2015-05-30 NOTE — Telephone Encounter (Signed)
Jordan Kennedy CAME IN TO 104 TODAY TO GET A COPY OF THE RESULTS FROM [facility]S LABS THAT WERE DONE ON [id number]. SHE WOULD LIKE SOMEONE TO CALL HER AND REVIEW THESE RESULTS WITH HER ASAP.

## 2015-05-31 NOTE — Telephone Encounter (Signed)
Please look at patient labs

## 2015-06-01 ENCOUNTER — Encounter: Payer: Self-pay | Admitting: Family Medicine

## 2015-06-01 NOTE — Telephone Encounter (Signed)
Labs sent to lab pool to contact caregiver with lab results.

## 2015-06-13 ENCOUNTER — Telehealth: Payer: Self-pay

## 2015-06-13 NOTE — Telephone Encounter (Signed)
[name] is calling on behalf of patient. She states that the patient is having severe diarrhea at night and needs a different prescription. Patient needs the miralax to be PRN at night as needed.

## 2015-06-15 NOTE — Telephone Encounter (Signed)
Lm to call back  Need more info

## 2015-06-15 NOTE — Telephone Encounter (Signed)
Get more details. Was put on Miralax for constipation. Ok to decrease dose or stop

## 2015-06-20 ENCOUNTER — Encounter: Payer: Self-pay | Admitting: Family Medicine

## 2015-06-20 ENCOUNTER — Ambulatory Visit (INDEPENDENT_AMBULATORY_CARE_PROVIDER_SITE_OTHER): Payer: Medicare Other | Admitting: Family Medicine

## 2015-06-20 VITALS — BP 100/68 | HR 105 | Temp 99.2°F | Resp 16 | Ht 73.0 in | Wt 194.0 lb

## 2015-06-20 DIAGNOSIS — R159 Full incontinence of feces: Secondary | ICD-10-CM

## 2015-06-20 DIAGNOSIS — R319 Hematuria, unspecified: Secondary | ICD-10-CM

## 2015-06-20 DIAGNOSIS — N400 Enlarged prostate without lower urinary tract symptoms: Secondary | ICD-10-CM

## 2015-06-20 DIAGNOSIS — Z23 Encounter for immunization: Secondary | ICD-10-CM | POA: Diagnosis not present

## 2015-06-20 DIAGNOSIS — K5901 Slow transit constipation: Secondary | ICD-10-CM | POA: Diagnosis not present

## 2015-06-20 MED ORDER — DOCUSATE SODIUM 100 MG PO CAPS: 100.0000 mg | ORAL_CAPSULE | Freq: Two times a day (BID) | ORAL | Status: DC

## 2015-06-20 NOTE — Progress Notes (Signed)
Subjective:    Patient ID: Jordan Kennedy, male    DOB: December 07, 1955, 59 y.o.   MRN: [id number]  06/20/2015  Encopresis   HPI This 59 y.o. male presents for evaluation of fecal incontinence.  S/p urology evaluation for microscopic hematuria and BPH. Started on Flomax by urology.  1.  Fecal incontinence: onset last week; had bowel movement at bedtime every night last night during sleep.  At last visit, did increase to twice daily Miralax due to once weekly bowel movements that were very large and causing plumbing issues; now having two b.m.s per day.  Requesting to decrease Miralax to every day. With once daily Miralax, was having one bowel per week with large volumes.  With twice daily Miralax, going twice daily.  History of playing with feces.  Has been having two bowel movements per day; having second bowel movement in middle of night around 3:00am.  Laying in it and making a mess.  Gas is really bad/foul.  Caregiver requesting pill.  Not wanting to drink all of Miralax.  Caregiver/Jordan Kennedy feels that some of psychotropic medications cause sedation at night and pt is too sleepy to get up to have b.m. In bathroom.  No daytime fecal incontinence.  No diarrhea; no bloody stools.   2.  Hematuria:  Followed up with urology; s/p cystoscopy WNL.  Prostate enlarged.  Started Flomax.  Having rare urinary incontinence now.  3.  Schizophrenia: with fecal incontinence, pt getting upset and recently threw all clothes away.  Sleeps very hard at night.  Pt expresses that he does not understand why he is having accidents at night.  4.  Flu vaccine: agreeable.    Review of Systems  Constitutional: Negative for fever, chills, diaphoresis, activity change, appetite change, fatigue and unexpected weight change.  Gastrointestinal: Negative for nausea, vomiting, abdominal pain, diarrhea, constipation, blood in stool, abdominal distention, anal bleeding and rectal pain.  Genitourinary: Negative for dysuria, urgency,  frequency, hematuria, flank pain, difficulty urinating and testicular pain.  Skin: Negative for rash.    Past Medical History  Diagnosis Date  . Schizophrenia     Monarche every three months;   . Mental retardation   . Anemia   . BPH (benign prostatic hyperplasia)    History reviewed. No pertinent past surgical history. No Known Allergies Current Outpatient Prescriptions  Medication Sig Dispense Refill  . benztropine (COGENTIN) 1 MG tablet Take 1 mg by mouth 3 (three) times daily.    . clonazePAM (KLONOPIN) 0.5 MG tablet Take 0.5 mg by mouth 2 (two) times daily.    . cloZAPine (CLOZARIL) 100 MG tablet Take 200-300 mg by mouth See admin instructions. Takes 29m every morning and 3056mevery night at bedtime    . PARoxetine (PAXIL) 20 MG tablet Take 20 mg by mouth every morning.    . Marland Kitchenspirin 81 MG tablet Take 1 tablet (81 mg total) by mouth daily. 100 tablet 3  . docusate sodium (COLACE) 100 MG capsule Take 1 capsule (100 mg total) by mouth 2 (two) times daily. 60 capsule 5  . ketoconazole (NIZORAL) 2 % cream Apply 1 application topically daily. Apply to bilateral feet and lower legs every morning for one month 60 g 1  . polyethylene glycol powder (GLYCOLAX/MIRALAX) powder Take 17 g by mouth 2 (two) times daily. 3350 g prn  . tamsulosin (FLOMAX) 0.4 MG CAPS capsule Take 0.4 mg by mouth.    . Triamcinolone Acetonide (TRIAMCINOLONE 0.1 % CREAM : EUCERIN) CREA Apply 1 application topically  at bedtime. 1 each 1   No current facility-administered medications for this visit.   Social History   Social History  . Marital Status: Single    Spouse Name: N/A  . Number of Children: N/A  . Years of Education: N/A   Occupational History  . Not on file.   Social History Main Topics  . Smoking status: Current Every Day Smoker -- 0.25 packs/day for 30 years    Types: Cigarettes  . Smokeless tobacco: Never Used  . Alcohol Use: No  . Drug Use: No  . Sexual Activity: No   Other Topics  Concern  . Not on file   Social History Narrative   Marital status: single      Children: none       Employment: disability due to mental retardation and schizophrenia.      Lives: group home [place]; [facility]; 3 men in group home.        Tobacco: six cigarettes per day; since age 34.      Alcohol:  None      Drugs: none      Exercise:  Walking some.      Seatbelt:  100%      Family History  Problem Relation Age of Onset  . Cancer Mother     unknown type       Objective:    BP 100/68 mmHg  Pulse 105  Temp(Src) 99.2 F (37.3 C) (Oral)  Resp 16  Ht '6\' 1"'  (1.854 m)  Wt 194 lb (87.998 kg)  BMI 25.60 kg/m2  SpO2 97% Physical Exam  Constitutional: He is oriented to person, place, and time. He appears well-developed and well-nourished. No distress.  HENT:  Head: Normocephalic and atraumatic.  Eyes: Conjunctivae and EOM are normal. Pupils are equal, round, and reactive to light.  Neck: Normal range of motion. Neck supple. Carotid bruit is not present. No thyromegaly present.  Cardiovascular: Normal rate, regular rhythm, normal heart sounds and intact distal pulses.  Exam reveals no gallop and no friction rub.   No murmur heard. Pulmonary/Chest: Effort normal and breath sounds normal. He has no wheezes. He has no rales.  Abdominal: Soft. Bowel sounds are normal. He exhibits no distension and no mass. There is no tenderness. There is no rebound and no guarding.  Lymphadenopathy:    He has no cervical adenopathy.  Neurological: He is alert and oriented to person, place, and time. No cranial nerve deficit.  Skin: Skin is warm and dry. No rash noted. He is not diaphoretic.  Psychiatric: He has a normal mood and affect. His behavior is normal.  Nursing note and vitals reviewed.  Results for orders placed or performed in visit on 05/25/15  CBC with Differential/Platelet  Result Value Ref Range   WBC 10.0 4.0 - 10.5 K/uL   RBC 4.41 4.22 - 5.81  MIL/uL   Hemoglobin 13.6 13.0 - 17.0 g/dL   HCT 40.8 39.0 - 52.0 %   MCV 92.5 78.0 - 100.0 fL   MCH 30.8 26.0 - 34.0 pg   MCHC 33.3 30.0 - 36.0 g/dL   RDW 14.7 11.5 - 15.5 %   Platelets 300 150 - 400 K/uL   MPV 10.6 8.6 - 12.4 fL   Neutrophils Relative % 70 43 - 77 %   Neutro Abs 7.0 1.7 - 7.7 K/uL   Lymphocytes Relative 21 12 - 46 %   Lymphs Abs 2.1 0.7 - 4.0 K/uL  Monocytes Relative 8 3 - 12 %   Monocytes Absolute 0.8 0.1 - 1.0 K/uL   Eosinophils Relative 1 0 - 5 %   Eosinophils Absolute 0.1 0.0 - 0.7 K/uL   Basophils Relative 0 0 - 1 %   Basophils Absolute 0.0 0.0 - 0.1 K/uL   Smear Review Criteria for review not met   Comprehensive metabolic panel  Result Value Ref Range   Sodium 141 135 - 146 mmol/L   Potassium 4.1 3.5 - 5.3 mmol/L   Chloride 107 98 - 110 mmol/L   CO2 26 20 - 31 mmol/L   Glucose, Bld 119 (H) 65 - 99 mg/dL   BUN 13 7 - 25 mg/dL   Creat 1.10 0.70 - 1.33 mg/dL   Total Bilirubin 0.4 0.2 - 1.2 mg/dL   Alkaline Phosphatase 91 40 - 115 U/L   AST 19 10 - 35 U/L   ALT 20 9 - 46 U/L   Total Protein 6.9 6.1 - 8.1 g/dL   Albumin 4.1 3.6 - 5.1 g/dL   Calcium 9.4 8.6 - 10.3 mg/dL   INFLUENZA VACCINE ADMINISTERED IN OFFICE.    Assessment & Plan:   1. Fecal incontinence   2. Slow transit constipation   3. BPH (benign prostatic hyperplasia)   4. Hematuria   5. Need for prophylactic vaccination and inoculation against influenza     1. Fecal incontinence: new since increasing Miralax to bid; stop Miralax per caregiver request; switch to Colace 145m bid; continue Miralax PRN only. May need to decrease Colace to q d; will monitor over next six weeks. 2. Constipation: improved with bid Miralax yet now having encopresis.   3. BPH: New.  S/p urology evaluation; started on Flomax. 4.  Hematuria microscopic: s/p urology evaluation; s/p cystoscopy; negative.  Released from urology. 5. S/p flu vaccine.   Meds ordered this encounter  Medications  . docusate sodium  (COLACE) 100 MG capsule    Sig: Take 1 capsule (100 mg total) by mouth 2 (two) times daily.    Dispense:  60 capsule    Refill:  5    No Follow-up on file.   [place] MElayne Guerin M.D. [id number] [place]  [phone] [phone]

## 2015-06-20 NOTE — Patient Instructions (Signed)
1. Discontinue Miralax twice daily. 2. Start Colace 100mg  one pill twice daily with meals. 3.  Use Miralax one capful in 8 ounces of liquid every morning as needed for constipation.

## 2015-08-29 ENCOUNTER — Ambulatory Visit (INDEPENDENT_AMBULATORY_CARE_PROVIDER_SITE_OTHER): Payer: Medicare Other | Admitting: Family Medicine

## 2015-08-29 ENCOUNTER — Encounter: Payer: Self-pay | Admitting: Family Medicine

## 2015-08-29 VITALS — BP 104/57 | HR 104 | Temp 99.6°F | Resp 16 | Ht 73.25 in | Wt 197.6 lb

## 2015-08-29 DIAGNOSIS — Z720 Tobacco use: Secondary | ICD-10-CM | POA: Diagnosis not present

## 2015-08-29 DIAGNOSIS — J988 Other specified respiratory disorders: Secondary | ICD-10-CM | POA: Diagnosis not present

## 2015-08-29 DIAGNOSIS — K5901 Slow transit constipation: Secondary | ICD-10-CM

## 2015-08-29 DIAGNOSIS — J22 Unspecified acute lower respiratory infection: Secondary | ICD-10-CM

## 2015-08-29 MED ORDER — POLYETHYLENE GLYCOL 3350 17 GM/SCOOP PO POWD: 17.0000 g | ORAL | Status: DC

## 2015-08-29 MED ORDER — AZITHROMYCIN 250 MG PO TABS: ORAL_TABLET | ORAL | Status: DC

## 2015-08-29 MED ORDER — DOXYCYCLINE HYCLATE 100 MG PO CAPS: 100.0000 mg | ORAL_CAPSULE | Freq: Two times a day (BID) | ORAL | Status: DC

## 2015-08-29 MED ORDER — MUCINEX DM 30-600 MG PO TB12: 1.0000 | ORAL_TABLET | Freq: Two times a day (BID) | ORAL | Status: DC | PRN

## 2015-08-29 NOTE — Progress Notes (Signed)
Subjective:    Patient ID: Jordan Kennedy, male    DOB: Aug 16, 1956, 59 y.o.   MRN: [id number]  08/29/2015  Follow-up   HPI This 59 y.o. male presents for six week follow-up of constipation and encoparesis.  At last visit, changed Miralax to PRN; started Colace 100mg  bid.  Colace has moderate effect; now when he needs to have b.m. He is not having to strain. Still not regular.  Also depends on diet.  Last week went twice (Tuesday, Friday).  No plumbing issues.  Previously, once a day Miralax was not effective.  No longer having accidents at night.    Coughing: onset three weeks ago; coughs a lot; no SOB.  No fever/chills/sweats.  No sore throat, ear pain, rhinorrhea,   Review of Systems  Constitutional: Negative for fever, chills, diaphoresis, activity change, appetite change and fatigue.  HENT: Negative for congestion, ear pain and rhinorrhea.   Respiratory: Positive for cough. Negative for shortness of breath.   Cardiovascular: Negative for chest pain, palpitations and leg swelling.  Gastrointestinal: Positive for constipation. Negative for nausea, vomiting, abdominal pain, diarrhea, blood in stool and anal bleeding.  Endocrine: Negative for cold intolerance, heat intolerance, polydipsia, polyphagia and polyuria.  Skin: Negative for color change, rash and wound.  Neurological: Negative for dizziness, tremors, seizures, syncope, facial asymmetry, speech difficulty, weakness, light-headedness, numbness and headaches.  Psychiatric/Behavioral: Negative for sleep disturbance and dysphoric mood. The patient is not nervous/anxious.     Past Medical History  Diagnosis Date  . Schizophrenia ([place])     Monarche every three months;   . Mental retardation   . Anemia   . BPH (benign prostatic hyperplasia)    No past surgical history on file. No Known Allergies  Social History   Social History  . Marital Status: Single    Spouse Name: N/A  . Number of Children: N/A  . Years of Education:  N/A   Occupational History  . Not on file.   Social History Main Topics  . Smoking status: Current Every Day Smoker -- 0.25 packs/day for 30 years    Types: Cigarettes  . Smokeless tobacco: Never Used  . Alcohol Use: No  . Drug Use: No  . Sexual Activity: No   Other Topics Concern  . Not on file   Social History Narrative   Marital status: single      Children: none       Employment: disability due to mental retardation and schizophrenia.      Lives: group home [place]; [facility]; 3 men in group home.        Tobacco: six cigarettes per day; since age 74.      Alcohol:  None      Drugs: none      Exercise:  Walking some.      Seatbelt:  100%      Family History  Problem Relation Age of Onset  . Cancer Mother     unknown type       Objective:    BP 104/57 mmHg  Pulse 104  Temp(Src) 99.6 F (37.6 C) (Oral)  Resp 16  Ht 6' 1.25" (1.861 m)  Wt 197 lb 9.6 oz (89.631 kg)  BMI 25.88 kg/m2 Physical Exam  Constitutional: He is oriented to person, place, and time. He appears well-developed and well-nourished. No distress.  HENT:  Head: Normocephalic and atraumatic.  Right Ear: External ear normal.  Left Ear: External ear normal.  Nose: Nose  normal.  Mouth/Throat: Oropharynx is clear and moist.  Eyes: Conjunctivae and EOM are normal. Pupils are equal, round, and reactive to light.  Neck: Normal range of motion. Neck supple. Carotid bruit is not present. No thyromegaly present.  Cardiovascular: Normal rate, regular rhythm, normal heart sounds and intact distal pulses.  Exam reveals no gallop and no friction rub.   No murmur heard. Pulmonary/Chest: Effort normal and breath sounds normal. He has no wheezes. He has no rales.  Abdominal: Soft. Bowel sounds are normal. He exhibits no distension and no mass. There is no tenderness. There is no rebound and no guarding.  Lymphadenopathy:    He has no cervical adenopathy.  Neurological: He is alert  and oriented to person, place, and time. No cranial nerve deficit.  Skin: Skin is warm and dry. No rash noted. He is not diaphoretic.  Psychiatric: He has a normal mood and affect. His behavior is normal.  Nursing note and vitals reviewed.       Assessment & Plan:   1. Slow transit constipation   2. Lower respiratory infection   3. Tobacco abuse    1. Constipation: continue Colace 100mg  bid; restart Miralax daily. 2.  Lower respiratory infection: New. Rx for Doxycycline and Mucinex DM.    No orders of the defined types were placed in this encounter.   Meds ordered this encounter  Medications  . polyethylene glycol powder (GLYCOLAX/MIRALAX) powder    Sig: Take 17 g by mouth every morning.    Dispense:  3350 g    Refill:  prn  . DISCONTD: azithromycin (ZITHROMAX) 250 MG tablet    Sig: Two tablets daily x 1 day then one tablet daily x 4 days    Dispense:  6 each    Refill:  0  . doxycycline (VIBRAMYCIN) 100 MG capsule    Sig: Take 1 capsule (100 mg total) by mouth 2 (two) times daily.    Dispense:  20 capsule    Refill:  0    PLEASE DELETE PRESCRIPTION FOR ZPACK  . DISCONTD: Dextromethorphan-Guaifenesin (MUCINEX DM) 30-600 MG TB12    Sig: Take 1 tablet by mouth 2 (two) times daily as needed.    Dispense:  28 each    Refill:  0    Return in about 6 weeks (around 10/10/2015) for recheck.    Kristi Elayne Guerin, M.D. [facility] [address] [place], [place]  [address] [phone] phone [phone] fax

## 2015-09-14 ENCOUNTER — Ambulatory Visit (INDEPENDENT_AMBULATORY_CARE_PROVIDER_SITE_OTHER): Payer: Medicare Other | Admitting: Physician Assistant

## 2015-09-14 ENCOUNTER — Encounter: Payer: Self-pay | Admitting: Physician Assistant

## 2015-09-14 ENCOUNTER — Other Ambulatory Visit: Payer: Self-pay | Admitting: Physician Assistant

## 2015-09-14 VITALS — BP 114/73 | HR 96 | Temp 98.2°F | Resp 18 | Ht 73.25 in | Wt 196.0 lb

## 2015-09-14 DIAGNOSIS — R197 Diarrhea, unspecified: Secondary | ICD-10-CM

## 2015-09-14 LAB — CBC WITH DIFFERENTIAL/PLATELET
BASOS ABS: 0 10*3/uL (ref 0.0–0.1)
BASOS PCT: 0 % (ref 0–1)
EOS ABS: 0.1 10*3/uL (ref 0.0–0.7)
EOS PCT: 1 % (ref 0–5)
HCT: 35.8 % — ABNORMAL LOW (ref 39.0–52.0)
Hemoglobin: 12 g/dL — ABNORMAL LOW (ref 13.0–17.0)
Lymphocytes Relative: 32 % (ref 12–46)
Lymphs Abs: 2.8 10*3/uL (ref 0.7–4.0)
MCH: 30.8 pg (ref 26.0–34.0)
MCHC: 33.5 g/dL (ref 30.0–36.0)
MCV: 92 fL (ref 78.0–100.0)
MPV: 9.9 fL (ref 8.6–12.4)
Monocytes Absolute: 0.9 10*3/uL (ref 0.1–1.0)
Monocytes Relative: 11 % (ref 3–12)
NEUTROS PCT: 56 % (ref 43–77)
Neutro Abs: 4.8 10*3/uL (ref 1.7–7.7)
PLATELETS: 316 10*3/uL (ref 150–400)
RBC: 3.89 MIL/uL — ABNORMAL LOW (ref 4.22–5.81)
RDW: 15.1 % (ref 11.5–15.5)
WBC: 8.6 10*3/uL (ref 4.0–10.5)

## 2015-09-14 LAB — COMPLETE METABOLIC PANEL WITH GFR
ALBUMIN: 3.9 g/dL (ref 3.6–5.1)
ALK PHOS: 89 U/L (ref 40–115)
ALT: 13 U/L (ref 9–46)
AST: 22 U/L (ref 10–35)
BILIRUBIN TOTAL: 0.4 mg/dL (ref 0.2–1.2)
BUN: 11 mg/dL (ref 7–25)
CALCIUM: 8.7 mg/dL (ref 8.6–10.3)
CO2: 23 mmol/L (ref 20–31)
Chloride: 107 mmol/L (ref 98–110)
Creat: 0.92 mg/dL (ref 0.70–1.33)
Glucose, Bld: 91 mg/dL (ref 65–99)
Potassium: 3.8 mmol/L (ref 3.5–5.3)
Sodium: 140 mmol/L (ref 135–146)
TOTAL PROTEIN: 6.5 g/dL (ref 6.1–8.1)

## 2015-09-14 LAB — HEMOGLOBIN A1C
HEMOGLOBIN A1C: 5.9 % — AB (ref <5.7)
Mean Plasma Glucose: 123 mg/dL — ABNORMAL HIGH (ref <117)

## 2015-09-14 MED ORDER — PSYLLIUM 0.52 G PO CAPS: 1.0400 g | ORAL_CAPSULE | Freq: Two times a day (BID) | ORAL | Status: DC

## 2015-09-17 LAB — FERRITIN: Ferritin: 157 ng/mL (ref 22–322)

## 2015-09-17 LAB — IRON AND TIBC
%SAT: 13 % — AB (ref 15–60)
IRON: 36 ug/dL — AB (ref 50–180)
TIBC: 283 ug/dL (ref 250–425)
UIBC: 247 ug/dL (ref 125–400)

## 2015-09-17 LAB — VITAMIN B12: VITAMIN B 12: 470 pg/mL (ref 211–911)

## 2015-09-17 LAB — FOLATE: FOLATE: 9.3 ng/mL

## 2015-09-17 NOTE — Progress Notes (Signed)
09/17/2015 10:35 AM   DOB: 1955/12/07 / MRN: [id number]  SUBJECTIVE:  Jordan Kennedy is a 59 y.o. male presenting for night time fecal incontinence and diarrhea.  He was last seen by Reginia Forts MD on 08/29/15 for constipation.  He was taking Colace at that time and Miralax was added to his regimen for increased regularity.  Since that time his stool have become very lose, and his caregive, who is present at the interview, reports she is having to clean his stools multiple times nightly.  Denies day time fecal incontinence.    He has No Known Allergies.   He  has a past medical history of Schizophrenia ([place]); Mental retardation; Anemia; and BPH (benign prostatic hyperplasia).    He  reports that he has been smoking Cigarettes.  He has a 7.5 pack-year smoking history. He has never used smokeless tobacco. He reports that he does not drink alcohol or use illicit drugs. He  reports that he does not engage in sexual activity. The patient  has no past surgical history on file.  His family history includes Cancer in his mother.  Review of Systems  Constitutional: Negative for fever and chills.  Gastrointestinal: Positive for diarrhea. Negative for abdominal pain and blood in stool.  Genitourinary: Negative for dysuria.  Musculoskeletal: Negative for myalgias.  Neurological: Negative for dizziness and headaches.    Problem list and medications reviewed and updated by myself where necessary, and exist elsewhere in the encounter.   OBJECTIVE:  BP 114/73 mmHg  Pulse 96  Temp(Src) 98.2 F (36.8 C) (Oral)  Resp 18  Ht 6' 1.25" (1.861 m)  Wt 196 lb (88.905 kg)  BMI 25.67 kg/m2  SpO2 97% Estimated Creatinine Clearance: 100.9 mL/min (by C-G formula based on Cr of 0.92).  Physical Exam  Constitutional: He is oriented to person, place, and time. He appears well-developed. He does not appear ill.  Eyes: Conjunctivae and EOM are normal. Pupils are equal, round, and reactive to light.    Cardiovascular: Normal rate.   Pulmonary/Chest: Effort normal.  Abdominal: Bowel sounds are normal. He exhibits no distension. There is no tenderness.  Musculoskeletal: Normal range of motion.  Neurological: He is alert and oriented to person, place, and time. No cranial nerve deficit or sensory deficit. Coordination normal.  Skin: Skin is warm and dry. He is not diaphoretic.  Psychiatric:  He exhibits decreased mental aptitude.  (Not new)  Nursing note and vitals reviewed.   No results found for this or any previous visit (from the past 48 hour(s)).  ASSESSMENT AND PLAN  Rajender was seen today for diarrhea and medication problem.  Diagnoses and all orders for this visit:  Diarrhea, unspecified type: Stopping Colace.  Continuing Miralax daily.  His constipation may be caused by the antihistaminic properties inherent with paroxetine. Will add fiber to his daily regimen per below to increase bulk.   -     COMPLETE METABOLIC PANEL WITH GFR -     CBC with Differential/Platelet -     Hemoglobin A1c -     psyllium (METAMUCIL) 0.52 G capsule; Take 2 capsules (1.04 g total) by mouth 2 (two) times daily.    The patient was advised to call or return to clinic if he does not see an improvement in symptoms or to seek the care of the closest emergency department if he worsens with the above plan.   Philis Fendt, MHS, PA-C [facility] 09/17/2015 10:35 AM

## 2015-09-18 ENCOUNTER — Other Ambulatory Visit: Payer: Self-pay | Admitting: Physician Assistant

## 2015-09-18 DIAGNOSIS — D509 Iron deficiency anemia, unspecified: Secondary | ICD-10-CM | POA: Insufficient documentation

## 2015-09-18 DIAGNOSIS — Z1211 Encounter for screening for malignant neoplasm of colon: Secondary | ICD-10-CM

## 2015-09-18 HISTORY — DX: Iron deficiency anemia, unspecified: D50.9

## 2015-09-18 MED ORDER — FERROUS FUMARATE 325 (106 FE) MG PO TABS: 1.0000 | ORAL_TABLET | Freq: Two times a day (BID) | ORAL | Status: DC

## 2015-09-20 ENCOUNTER — Encounter: Payer: Self-pay | Admitting: Gastroenterology

## 2015-10-10 ENCOUNTER — Ambulatory Visit: Payer: Medicare Other | Admitting: Family Medicine

## 2015-11-21 ENCOUNTER — Encounter: Payer: Self-pay | Admitting: Gastroenterology

## 2015-11-21 ENCOUNTER — Ambulatory Visit (INDEPENDENT_AMBULATORY_CARE_PROVIDER_SITE_OTHER): Payer: Medicare Other | Admitting: Gastroenterology

## 2015-11-21 VITALS — BP 106/70 | HR 96 | Ht 73.0 in | Wt 192.2 lb

## 2015-11-21 DIAGNOSIS — D509 Iron deficiency anemia, unspecified: Secondary | ICD-10-CM

## 2015-11-21 DIAGNOSIS — K5901 Slow transit constipation: Secondary | ICD-10-CM | POA: Diagnosis not present

## 2015-11-21 MED ORDER — POLYETHYLENE GLYCOL 3350 17 GM/SCOOP PO POWD: ORAL | Status: DC

## 2015-11-21 MED ORDER — NA SULFATE-K SULFATE-MG SULF 17.5-3.13-1.6 GM/177ML PO SOLN: 1.0000 | Freq: Once | ORAL | Status: DC

## 2015-11-21 NOTE — Patient Instructions (Addendum)
You have been scheduled for a colonoscopy. Please follow written instructions given to you at your visit today.  Please pick up your prep supplies at the pharmacy within the next 1-3 days. If you use inhalers (even only as needed), please bring them with you on the day of your procedure. Your physician has requested that you go to www.startemmi.com and enter the access code given to you at your visit today. This web site gives a general overview about your procedure. However, you should still follow specific instructions given to you by our office regarding your preparation for the procedure.  We have printed a prescription of Miralax.   Thank you for choosing me and [facility].  Pricilla Riffle. Dagoberto Ligas., MD., Mar[facility] Regal  cc: Lillia Corporal, MD

## 2015-11-21 NOTE — Progress Notes (Signed)
    History of Present Illness: This is a 60 year old male referred by Wardell Honour, MD for the evaluation of iron deficiency anemia and chronic constipation. FOBT negative stool documented in 2016. He has schizophrenia and mental retardation and is a resident of a group home. He has had problems with constipation for many years. One caregiver at the group home and his power of attorney are here for his office visit. Recently his constipation has been managed with MiraLAX and Colace. He was treated with iron supplements for iron deficiency anemia and had further difficulties with his bowels. When his constipation regimen was increased he began to have episodes of nocturnal incontinence and his regimen was changed. On his current regimen he is having a bowel movement about every 2 weeks. The patient has no gastrointestinal complaints. His iron deficiency anemia has apparently corrected and iron was discontinued. Colace was also discontinued. Denies weight loss, abdominal pain, diarrhea, change in stool caliber, melena, hematochezia, nausea, vomiting, dysphagia, reflux symptoms, chest pain.   Review of Systems: Pertinent positive and negative review of systems were noted in the above HPI section. All other review of systems were otherwise negative.  Current Medications, Allergies, Past Medical History, Past Surgical History, Family History and Social History were reviewed in [employer] record.  Physical Exam: General: Well developed, well nourished, no acute distress Head: Normocephalic and atraumatic Eyes:  sclerae anicteric, EOMI Ears: Normal auditory acuity Mouth: No deformity or lesions. Edentulous Neck: Supple, no masses or thyromegaly Lungs: Clear throughout to auscultation Heart: Regular rate and rhythm; no murmurs, rubs or bruits Abdomen: Soft, non tender and non distended. No masses, hepatosplenomegaly or hernias noted. Normal Bowel sounds Rectal: deferred to  colonoscopy Musculoskeletal: Symmetrical with no gross deformities  Skin: No lesions on visible extremities Pulses:  Normal pulses noted Extremities: No clubbing, cyanosis, edema or deformities noted Neurological: Alert oriented x 4, grossly nonfocal Cervical Nodes:  No significant cervical adenopathy Inguinal Nodes: No significant inguinal adenopathy Psychological:  Alert and cooperative. Normal mood and affect  Assessment and Recommendations:  1. Chronic constipation and iron deficiency anemia with FOBT negative stool. Rule out colorectal neoplasms. The risks (including bleeding, perforation, infection, missed lesions, medication reactions and possible hospitalization or surgery if complications occur), benefits, and alternatives to colonoscopy with possible biopsy and possible polypectomy were discussed with the patientand POA and they consent to proceed. Continue MiraLAX and titrate dose between 1 and 3 times daily for a bowel movement every several days. May use magnesium citrate for refractory constipation. After his colonoscopy is complete will defer ongoing mgmt of his constipation to his PCP   cc: Wardell Honour, MD [address] [place], [place] [address]

## 2015-11-24 ENCOUNTER — Telehealth: Payer: Self-pay | Admitting: Family Medicine

## 2015-11-24 NOTE — Telephone Encounter (Signed)
lmom of new appt date nd time 02/28/2016 @1 :15

## 2015-11-24 NOTE — Telephone Encounter (Signed)
See previous meeesage

## 2015-12-02 ENCOUNTER — Ambulatory Visit (AMBULATORY_SURGERY_CENTER): Payer: Medicare Other | Admitting: Gastroenterology

## 2015-12-02 ENCOUNTER — Encounter: Payer: Self-pay | Admitting: Gastroenterology

## 2015-12-02 VITALS — BP 140/94 | HR 79 | Temp 97.0°F | Resp 34 | Ht 73.0 in | Wt 192.0 lb

## 2015-12-02 DIAGNOSIS — D509 Iron deficiency anemia, unspecified: Secondary | ICD-10-CM

## 2015-12-02 DIAGNOSIS — K635 Polyp of colon: Secondary | ICD-10-CM

## 2015-12-02 DIAGNOSIS — D125 Benign neoplasm of sigmoid colon: Secondary | ICD-10-CM | POA: Diagnosis not present

## 2015-12-02 DIAGNOSIS — K5901 Slow transit constipation: Secondary | ICD-10-CM

## 2015-12-02 MED ORDER — SODIUM CHLORIDE 0.9 % IV SOLN: 500.0000 mL | INTRAVENOUS | Status: DC

## 2015-12-02 NOTE — Patient Instructions (Signed)
YOU HAD AN ENDOSCOPIC PROCEDURE TODAY AT [facility]:   Refer to the procedure report that was given to you for any specific questions about what was found during the examination.  If the procedure report does not answer your questions, please call your gastroenterologist to clarify.  If you requested that your care partner not be given the details of your procedure findings, then the procedure report has been included in a sealed envelope for you to review at your convenience later.  YOU SHOULD EXPECT: Some feelings of bloating in the abdomen. Passage of more gas than usual.  Walking can help get rid of the air that was put into your GI tract during the procedure and reduce the bloating. If you had a lower endoscopy (such as a colonoscopy or flexible sigmoidoscopy) you may notice spotting of blood in your stool or on the toilet paper. If you underwent a bowel prep for your procedure, you may not have a normal bowel movement for a few days.  Please Note:  You might notice some irritation and congestion in your nose or some drainage.  This is from the oxygen used during your procedure.  There is no need for concern and it should clear up in a day or so.  SYMPTOMS TO REPORT IMMEDIATELY:   Following lower endoscopy (colonoscopy or flexible sigmoidoscopy):  Excessive amounts of blood in the stool  Significant tenderness or worsening of abdominal pains  Swelling of the abdomen that is new, acute  Fever of 100F or higher   For urgent or emergent issues, a gastroenterologist can be reached at any hour by calling [phone].   DIET: Your first meal following the procedure should be a small meal and then it is ok to progress to your normal diet. Heavy or fried foods are harder to digest and may make you feel nauseous or bloated.  Likewise, meals heavy in dairy and vegetables can increase bloating.  Drink plenty of fluids but you should avoid alcoholic beverages for 24  hours.  ACTIVITY:  You should plan to take it easy for the rest of today and you should NOT DRIVE or use heavy machinery until tomorrow (because of the sedation medicines used during the test).    FOLLOW UP: Our staff will call the number listed on your records the next business day following your procedure to check on you and address any questions or concerns that you may have regarding the information given to you following your procedure. If we do not reach you, we will leave a message.  However, if you are feeling well and you are not experiencing any problems, there is no need to return our call.  We will assume that you have returned to your regular daily activities without incident.  If any biopsies were taken you will be contacted by phone or by letter within the next 1-3 weeks.  Please call us at [phone] if you have not heard about the biopsies in 3 weeks.    SIGNATURES/CONFIDENTIALITY: You and/or your care partner have signed paperwork which will be entered into your electronic medical record.  These signatures attest to the fact that that the information above on your After Visit Summary has been reviewed and is understood.  Full responsibility of the confidentiality of this discharge information lies with you and/or your care-partner.  Polyp handout given Await pathology Repeat Colonoscopy in 5years

## 2015-12-02 NOTE — Progress Notes (Signed)
Called to room to assist during endoscopic procedure.  Patient ID and intended procedure confirmed with present staff. Received instructions for my participation in the procedure from the performing physician.  

## 2015-12-02 NOTE — Op Note (Signed)
[facility]  [address] [place], [id number]   COLONOSCOPY PROCEDURE REPORT  PATIENT: Jordan, Kennedy  MR#: [id number] BIRTHDATE: 1956-01-17 , 13  yrs. old GENDER: male ENDOSCOPIST: Ladene Artist, MD, [facility] REFERRED [id number] [place], M.D. PROCEDURE DATE:  12/02/2015 PROCEDURE:   Colonoscopy, diagnostic and Colonoscopy with snare polypectomy First Screening Colonoscopy - Avg.  risk and is 50 yrs.  old or older - No.  Prior Negative Screening - Now for repeat screening. N/A  History of Adenoma - Now for follow-up colonoscopy & has been > or = to 3 yrs.  N/A  Polyps removed today? Yes ASA CLASS:   Class II INDICATIONS:Unexplained iron deficiency anemia. MEDICATIONS: Monitored anesthesia care and Propofol 330 mg IV DESCRIPTION OF PROCEDURE:   After the risks benefits and alternatives of the procedure were thoroughly explained, informed consent was obtained.  The digital rectal exam revealed no abnormalities of the rectum except for decreased sphinter tone. The LB [id number]-H190 [id number]  endoscope was introduced through the anus and advanced to the cecum, which was identified by both the appendix and ileocecal valve. No adverse events experienced with a tortuous and redundant colon.   The quality of the prep was adequate after extensive rinsing and suctioning (MiraLax was used) The instrument was then slowly withdrawn as the colon was fully examined. Estimated blood loss is zero unless otherwise noted in this procedure report.    COLON FINDINGS: Two sessile polyps measuring 4-6 mm in size were found in the sigmoid colon.  Polypectomies were performed with a cold snare.  The resection was complete, the polyp tissue was completely retrieved and sent to histology.   The colonic mucosa appeared normal at the splenic flexure, in the transverse colon, rectum, descending colon, at the hepatic flexure, ileocecal valve, cecum, in the ascending colon, and at the appendiceal  orifice. Retroflexed views revealed no abnormalities. The time to cecum = 12.0 Withdrawal time = 15.5   The scope was withdrawn and the procedure completed. COMPLICATIONS: There were no immediate complications.  ENDOSCOPIC IMPRESSION: 1.   Two sessile polyps in the sigmoid colon; polypectomies performed with a cold snare 2.   The colonic mucosa otherwise appeared normal  RECOMMENDATIONS: 1.  Await pathology results 2.  Repeat colonoscopy in 5 years if polyp(s) adenomatous; otherwise 10 years with a more extensive bowel prep 3.  Schedule EGD to further evaluate iron deficiency anemia  eSigned:  Ladene Artist, MD, [facility] 12/02/2015 3:03 PM  [C

## 2015-12-02 NOTE — Progress Notes (Signed)
To pacu, vss, patent aw report to rn

## 2015-12-05 ENCOUNTER — Telehealth: Payer: Self-pay | Admitting: *Deleted

## 2015-12-05 NOTE — Telephone Encounter (Signed)
  Follow up Call-  Call back number 12/02/2015  Post procedure Call Back phone  # [phone]  Permission to leave phone message Yes     Patient questions:  Do you have a fever, pain , or abdominal swelling? No. Pain Score  0 *  Have you tolerated food without any problems? Yes.    Have you been able to return to your normal activities? Yes.    Do you have any questions about your discharge instructions: Diet   No. Medications  No. Follow up visit  No.  Do you have questions or concerns about your Care? No.  Actions: * If pain score is 4 or above: No action needed, pain <4.

## 2015-12-08 ENCOUNTER — Encounter: Payer: Self-pay | Admitting: Gastroenterology

## 2016-02-06 ENCOUNTER — Encounter: Payer: Medicare Other | Admitting: Family Medicine

## 2016-02-28 ENCOUNTER — Encounter: Payer: Medicare Other | Admitting: Family Medicine

## 2017-03-07 ENCOUNTER — Encounter (HOSPITAL_COMMUNITY): Payer: Self-pay | Admitting: Emergency Medicine

## 2017-03-07 ENCOUNTER — Ambulatory Visit (INDEPENDENT_AMBULATORY_CARE_PROVIDER_SITE_OTHER)
Admission: EM | Admit: 2017-03-07 | Discharge: 2017-03-07 | Disposition: A | Discharge status: Nursing Home (Includes ICF) | Payer: Medicare Other | Source: Home / Self Care

## 2017-03-07 ENCOUNTER — Encounter (HOSPITAL_COMMUNITY): Payer: Self-pay | Admitting: Family Medicine

## 2017-03-07 ENCOUNTER — Inpatient Hospital Stay (HOSPITAL_COMMUNITY): Payer: Medicare Other

## 2017-03-07 ENCOUNTER — Inpatient Hospital Stay (HOSPITAL_COMMUNITY)
Admission: EM | Admit: 2017-03-07 | Discharge: 2017-03-12 | DRG: 392 | Disposition: A | Discharge status: Home / Self Care | Payer: Medicare Other | Attending: Internal Medicine | Admitting: Internal Medicine

## 2017-03-07 ENCOUNTER — Emergency Department (HOSPITAL_COMMUNITY): Payer: Medicare Other

## 2017-03-07 DIAGNOSIS — K5901 Slow transit constipation: Secondary | ICD-10-CM | POA: Diagnosis present

## 2017-03-07 DIAGNOSIS — K5903 Drug induced constipation: Principal | ICD-10-CM | POA: Diagnosis present

## 2017-03-07 DIAGNOSIS — Z7982 Long term (current) use of aspirin: Secondary | ICD-10-CM

## 2017-03-07 DIAGNOSIS — T424X5A Adverse effect of benzodiazepines, initial encounter: Secondary | ICD-10-CM | POA: Diagnosis present

## 2017-03-07 DIAGNOSIS — T443X5A Adverse effect of other parasympatholytics [anticholinergics and antimuscarinics] and spasmolytics, initial encounter: Secondary | ICD-10-CM | POA: Diagnosis present

## 2017-03-07 DIAGNOSIS — K56609 Unspecified intestinal obstruction, unspecified as to partial versus complete obstruction: Secondary | ICD-10-CM

## 2017-03-07 DIAGNOSIS — T43225A Adverse effect of selective serotonin reuptake inhibitors, initial encounter: Secondary | ICD-10-CM | POA: Diagnosis present

## 2017-03-07 DIAGNOSIS — K56600 Partial intestinal obstruction, unspecified as to cause: Secondary | ICD-10-CM | POA: Diagnosis not present

## 2017-03-07 DIAGNOSIS — F209 Schizophrenia, unspecified: Secondary | ICD-10-CM | POA: Diagnosis present

## 2017-03-07 DIAGNOSIS — N400 Enlarged prostate without lower urinary tract symptoms: Secondary | ICD-10-CM | POA: Diagnosis present

## 2017-03-07 DIAGNOSIS — Z978 Presence of other specified devices: Secondary | ICD-10-CM

## 2017-03-07 DIAGNOSIS — K5900 Constipation, unspecified: Secondary | ICD-10-CM | POA: Diagnosis not present

## 2017-03-07 DIAGNOSIS — F79 Unspecified intellectual disabilities: Secondary | ICD-10-CM

## 2017-03-07 DIAGNOSIS — F17210 Nicotine dependence, cigarettes, uncomplicated: Secondary | ICD-10-CM | POA: Diagnosis present

## 2017-03-07 DIAGNOSIS — R140 Abdominal distension (gaseous): Secondary | ICD-10-CM | POA: Diagnosis not present

## 2017-03-07 DIAGNOSIS — K567 Ileus, unspecified: Secondary | ICD-10-CM | POA: Diagnosis not present

## 2017-03-07 DIAGNOSIS — D72828 Other elevated white blood cell count: Secondary | ICD-10-CM | POA: Diagnosis present

## 2017-03-07 DIAGNOSIS — Y92199 Unspecified place in other specified residential institution as the place of occurrence of the external cause: Secondary | ICD-10-CM | POA: Diagnosis not present

## 2017-03-07 HISTORY — DX: Ileus, unspecified: K56.7

## 2017-03-07 LAB — COMPREHENSIVE METABOLIC PANEL
ALK PHOS: 83 U/L (ref 38–126)
ALT: 31 U/L (ref 17–63)
AST: 26 U/L (ref 15–41)
Albumin: 4.2 g/dL (ref 3.5–5.0)
Anion gap: 9 (ref 5–15)
BILIRUBIN TOTAL: 0.5 mg/dL (ref 0.3–1.2)
BUN: 13 mg/dL (ref 6–20)
CALCIUM: 9.3 mg/dL (ref 8.9–10.3)
CO2: 24 mmol/L (ref 22–32)
CREATININE: 0.98 mg/dL (ref 0.61–1.24)
Chloride: 108 mmol/L (ref 101–111)
GFR calc Af Amer: >60 mL/min (ref >60)
GFR calc non Af Amer: >60 mL/min (ref >60)
Glucose, Bld: 117 mg/dL — ABNORMAL HIGH (ref 65–99)
Potassium: 3.7 mmol/L (ref 3.5–5.1)
Sodium: 141 mmol/L (ref 135–145)
TOTAL PROTEIN: 7.3 g/dL (ref 6.5–8.1)

## 2017-03-07 LAB — CBC
HCT: 41.8 % (ref 39.0–52.0)
Hemoglobin: 13.6 g/dL (ref 13.0–17.0)
MCH: 31.3 pg (ref 26.0–34.0)
MCHC: 32.5 g/dL (ref 30.0–36.0)
MCV: 96.1 fL (ref 78.0–100.0)
PLATELETS: 316 10*3/uL (ref 150–400)
RBC: 4.35 MIL/uL (ref 4.22–5.81)
RDW: 16.5 % — AB (ref 11.5–15.5)
WBC: 16.9 10*3/uL — ABNORMAL HIGH (ref 4.0–10.5)

## 2017-03-07 LAB — LIPASE, BLOOD: Lipase: 78 U/L — ABNORMAL HIGH (ref 11–51)

## 2017-03-07 LAB — I-STAT CG4 LACTIC ACID, ED: Lactic Acid, Venous: 1.56 mmol/L (ref 0.5–1.9)

## 2017-03-07 MED ORDER — SODIUM CHLORIDE 0.9 % IV SOLN
INTRAVENOUS | Status: DC
  Administered 2017-03-08 (×2): via INTRAVENOUS
  Administered 2017-03-09 (×2): 1 mL via INTRAVENOUS

## 2017-03-07 MED ORDER — BENZTROPINE MESYLATE 1 MG PO TABS: 1.0000 mg | ORAL_TABLET | Freq: Three times a day (TID) | ORAL | Status: DC

## 2017-03-07 MED ORDER — TAMSULOSIN HCL 0.4 MG PO CAPS
0.4000 mg | ORAL_CAPSULE | Freq: Every day | ORAL | Status: DC
  Administered 2017-03-08 – 2017-03-12 (×5): 0.4 mg via ORAL
  Filled 2017-03-07 (×5): qty 1

## 2017-03-07 MED ORDER — BENZOCAINE (TOPICAL) 20 % EX AERO: INHALATION_SPRAY | Freq: Four times a day (QID) | CUTANEOUS | Status: DC | PRN

## 2017-03-07 MED ORDER — ONDANSETRON HCL 4 MG/2ML IJ SOLN
4.0000 mg | Freq: Once | INTRAMUSCULAR | Status: AC
  Administered 2017-03-07: 4 mg via INTRAVENOUS
  Filled 2017-03-07: qty 2

## 2017-03-07 MED ORDER — FLEET ENEMA 7-19 GM/118ML RE ENEM: 1.0000 | ENEMA | Freq: Once | RECTAL | Status: DC | PRN

## 2017-03-07 MED ORDER — LINACLOTIDE 290 MCG PO CAPS: 290.0000 ug | ORAL_CAPSULE | Freq: Every day | ORAL | Status: DC

## 2017-03-07 MED ORDER — MORPHINE SULFATE (PF) 4 MG/ML IV SOLN
2.0000 mg | Freq: Once | INTRAVENOUS | Status: AC
  Administered 2017-03-07: 2 mg via INTRAVENOUS
  Filled 2017-03-07: qty 1

## 2017-03-07 MED ORDER — MIRABEGRON ER 25 MG PO TB24
50.0000 mg | ORAL_TABLET | Freq: Every evening | ORAL | Status: DC
  Administered 2017-03-08 – 2017-03-12 (×5): 50 mg via ORAL
  Filled 2017-03-07 (×6): qty 2

## 2017-03-07 MED ORDER — CLOZAPINE 100 MG PO TABS: 100.0000 mg | ORAL_TABLET | ORAL | Status: DC

## 2017-03-07 MED ORDER — PANTOPRAZOLE SODIUM 40 MG PO TBEC
80.0000 mg | DELAYED_RELEASE_TABLET | Freq: Every day | ORAL | Status: DC
  Administered 2017-03-09 – 2017-03-12 (×4): 80 mg via ORAL
  Filled 2017-03-07 (×4): qty 2

## 2017-03-07 MED ORDER — BENZOCAINE 20 % MT AERO
INHALATION_SPRAY | Freq: Four times a day (QID) | OROMUCOSAL | Status: DC | PRN
  Administered 2017-03-07: 1 via OROMUCOSAL
  Filled 2017-03-07: qty 57

## 2017-03-07 MED ORDER — PAROXETINE HCL 20 MG PO TABS
40.0000 mg | ORAL_TABLET | Freq: Every evening | ORAL | Status: DC
  Administered 2017-03-08 – 2017-03-09 (×2): 40 mg via ORAL
  Filled 2017-03-07 (×2): qty 2

## 2017-03-07 MED ORDER — ASPIRIN 81 MG PO CHEW
81.0000 mg | CHEWABLE_TABLET | Freq: Every day | ORAL | Status: DC
  Administered 2017-03-09 – 2017-03-12 (×4): 81 mg via ORAL
  Filled 2017-03-07 (×4): qty 1

## 2017-03-07 MED ORDER — SODIUM CHLORIDE 0.9 % IV BOLUS (SEPSIS)
1000.0000 mL | Freq: Once | INTRAVENOUS | Status: AC
  Administered 2017-03-07: 1000 mL via INTRAVENOUS

## 2017-03-07 MED ORDER — IOPAMIDOL (ISOVUE-300) INJECTION 61%
INTRAVENOUS | Status: AC
Start: 2017-03-07 — End: 2017-03-07
  Administered 2017-03-07: 100 mL
  Filled 2017-03-07: qty 100

## 2017-03-07 MED ORDER — ENOXAPARIN SODIUM 40 MG/0.4ML ~~LOC~~ SOLN
40.0000 mg | SUBCUTANEOUS | Status: DC
  Administered 2017-03-08 – 2017-03-12 (×5): 40 mg via SUBCUTANEOUS
  Filled 2017-03-07 (×5): qty 0.4

## 2017-03-07 NOTE — H&P (Signed)
History and Physical    Jordan Kennedy MBT:[id number] DOB: Mar 06, 1956 DOA: 03/07/2017  PCP: [name], MD  Patient coming from: [place] have personally briefly reviewed patient's old medical records in [place]  Chief Complaint: Abd pain  HPI: Jordan Kennedy is a 61 y.o. male with medical history significant of Schizophrenia and MR, lives in halfway house under supervision.  On varity of meds which have resulted in chronic constipation.  Presents to ED from UC over concern for bowel obstruction.  Last BM was 5/15.  Progressive abd distention over past 2 days.  Worsened significantly today.  Nausea and multiple episodes of vomiting PTA.   ED Course: CT scan shows large stool burden, and obstruction with fluid filled small bowel and stomach (see images).   Review of Systems: As per HPI otherwise 10 point review of systems negative.   Past Medical History:  Diagnosis Date  . Anemia   . BPH (benign prostatic hyperplasia)   . Mental retardation   . Schizophrenia ([place])    Monarche every three months;     History reviewed. No pertinent surgical history.   reports that he has been smoking Cigarettes.  He has a 7.50 pack-year smoking history. He has never used smokeless tobacco. He reports that he does not drink alcohol or use drugs.  No Known Allergies  Family History  Problem Relation Age of Onset  . Cancer Mother        unknown type     Prior to Admission medications   Medication Sig Start Date End Date Taking? Authorizing Provider  aspirin 81 MG tablet Take 1 tablet (81 mg total) by mouth daily. 01/31/15  Yes Wardell Honour, MD  benztropine (COGENTIN) 1 MG tablet Take 1 mg by mouth 3 (three) times daily.   Yes [provider]  cloZAPine (CLOZARIL) 100 MG tablet Take 100-200 mg by mouth See admin instructions. Takes 200mg  every morning and take 1 tablet at 5 pm then take 200mg  every night at bedtime   Yes [provider]    linaclotide (LINZESS) 290 MCG CAPS capsule Take 290 mcg by mouth daily before breakfast.   Yes [provider]  mirabegron ER (MYRBETRIQ) 50 MG TB24 tablet Take 50 mg by mouth every evening.   Yes [provider]  omeprazole (PRILOSEC) 40 MG capsule Take 40 mg by mouth every evening.   Yes [provider]  PARoxetine (PAXIL) 40 MG tablet Take 40 mg by mouth every evening.   Yes [provider]  polyethylene glycol (MIRALAX / GLYCOLAX) packet Take 17 g by mouth daily as needed for mild constipation.   Yes [provider]  tamsulosin (FLOMAX) 0.4 MG CAPS capsule Take 0.4 mg by mouth daily after supper.    Yes [provider]    Physical Exam: Vitals:   03/07/17 2121 03/07/17 2135 03/07/17 2200  BP: (!) 150/102 (!) 150/102 (!) 152/96  Pulse: 100 (!) 102 94  Resp: (!) 26 (!) 22 (!) 21  Temp: 98.3 F (36.8 C)    TempSrc: Oral    SpO2: 100% 97% 98%    Constitutional: NAD, calm, comfortable Eyes: PERRL, lids and conjunctivae normal ENMT: Mucous membranes are moist. Posterior pharynx clear of any exudate or lesions.Normal dentition.  Neck: normal, supple, no masses, no thyromegaly Respiratory: clear to auscultation bilaterally, no wheezing, no crackles. Normal respiratory effort. No accessory muscle use.  Cardiovascular: Regular rate and rhythm, no murmurs / rubs / gallops. No  extremity edema. 2+ pedal pulses. No carotid bruits.  Abdomen: Rigid abdomen, decreased BS, distended Musculoskeletal: no clubbing / cyanosis. No joint deformity upper and lower extremities. Good ROM, no contractures. Normal muscle tone.  Skin: no rashes, lesions, ulcers. No induration Neurologic: CN 2-12 grossly intact. Sensation intact, DTR normal. Strength 5/5 in all 4.  Psychiatric: Normal judgment and insight. Alert and oriented x 3. Normal mood.    Labs on Admission: I have personally reviewed following labs and imaging studies  CBC:  Recent Labs Lab  03/07/17 2031  WBC 16.9*  HGB 13.6  HCT 41.8  MCV 96.1  PLT 382   Basic Metabolic Panel:  Recent Labs Lab 03/07/17 2031  NA 141  K 3.7  CL 108  CO2 24  GLUCOSE 117*  BUN 13  CREATININE 0.98  CALCIUM 9.3   GFR: CrCl cannot be calculated (Unknown ideal weight.). Liver Function Tests:  Recent Labs Lab 03/07/17 2031  AST 26  ALT 31  ALKPHOS 83  BILITOT 0.5  PROT 7.3  ALBUMIN 4.2    Recent Labs Lab 03/07/17 2031  LIPASE 78*   No results for input(s): AMMONIA in the last 168 hours. Coagulation Profile: No results for input(s): INR, PROTIME in the last 168 hours. Cardiac Enzymes: No results for input(s): CKTOTAL, CKMB, CKMBINDEX, TROPONINI in the last 168 hours. BNP (last 3 results) No results for input(s): PROBNP in the last 8760 hours. HbA1C: No results for input(s): HGBA1C in the last 72 hours. CBG: No results for input(s): GLUCAP in the last 168 hours. Lipid Profile: No results for input(s): CHOL, HDL, LDLCALC, TRIG, CHOLHDL, LDLDIRECT in the last 72 hours. Thyroid Function Tests: No results for input(s): TSH, T4TOTAL, FREET4, T3FREE, THYROIDAB in the last 72 hours. Anemia Panel: No results for input(s): VITAMINB12, FOLATE, FERRITIN, TIBC, IRON, RETICCTPCT in the last 72 hours. Urine analysis:    Component Value Date/Time   COLORURINE AMBER (A) 06/12/2012 1315   APPEARANCEUR CLOUDY (A) 06/12/2012 1315   LABSPEC 1.034 (H) 06/12/2012 1315   PHURINE 5.5 06/12/2012 1315   GLUCOSEU NEGATIVE 06/12/2012 1315   HGBUR TRACE (A) 06/12/2012 1315   BILIRUBINUR Negative 01/31/2015 1010   KETONESUR TRACE (A) 06/12/2012 1315   PROTEINUR Negative 01/31/2015 1010   PROTEINUR 30 (A) 06/12/2012 1315   UROBILINOGEN 0.2 01/31/2015 1010   UROBILINOGEN 1.0 06/12/2012 1315   NITRITE Negative 01/31/2015 1010   NITRITE NEGATIVE 06/12/2012 1315   LEUKOCYTESUR Negative 01/31/2015 1010    Radiological Exams on Admission: Dg Abdomen 1 View  Result Date:  03/07/2017 CLINICAL DATA:  Severe stomach pain sudden onset EXAM: ABDOMEN - 1 VIEW COMPARISON:  06/12/2012 FINDINGS: Visualized lung bases are grossly clear. Moderate diffuse gaseous dilatation of small and large bowel with gas in the rectum. No abnormal calcifications. IMPRESSION: Moderate diffuse gaseous dilatation of small and large bowel, could indicate ileus with partial obstruction also a consideration. CT correlation may be helpful for further evaluation. Electronically Signed   By: Donavan Foil M.D.   On: 03/07/2017 21:02   Ct Abdomen Pelvis W Contrast  Result Date: 03/07/2017 CLINICAL DATA:  Abdominal pain and distension. EXAM: CT ABDOMEN AND PELVIS WITH CONTRAST TECHNIQUE: Multidetector CT imaging of the abdomen and pelvis was performed using the standard protocol following bolus administration of intravenous contrast. CONTRAST:  170mL ISOVUE-300 IOPAMIDOL (ISOVUE-300) INJECTION 61% COMPARISON:  Radiographs earlier this day.  CT 04/25/2012 FINDINGS: Lower chest: Mild dependent atelectasis at the lung bases. No pleural fluid. No consolidation. Hepatobiliary: Subcentimeter hypodensity  in the right lobe is unchanged from prior exam. Small subcentimeter hypodensity adjacent to the middle hepatic vein is grossly similar, partially obscured by motion. Small subcapsular 11 mm lesion in the right lobe is unchanged. No new hepatic lesion. Gallbladder physiologically distended, no calcified stone. No biliary dilatation. Pancreas: No peripancreatic inflammation. Prominence of the proximal pancreatic duct measuring 3.3 mm. Mild parenchymal atrophy. Spleen: Normal in size without focal abnormality. Adrenals/Urinary Tract: No adrenal nodule. No hydronephrosis. Homogeneous renal enhancement. There is symmetric renal excretion on delayed phase imaging. Urinary bladder is nondistended and not well evaluated. Stomach/Bowel: Marked gastric distention with ingested contents. Bowel evaluation is technically difficult  given the diffuse GI tract distension. Small bowel is diffusely dilated and fluid-filled. No definite transition point. Large colonic stool burden throughout the colon which appears tortuous and difficult to define. Sigmoid colon and rectum with only minimal stool. Appendix is not visualized due to diffuse bowel dilatation. There is no evidence of bowel wall thickening. No pneumatosis. No evidence of mesenteric twisting. Vascular/Lymphatic: No significant vascular findings are present. No enlarged abdominal or pelvic lymph nodes, evaluation for adenopathy is limited due to bowel distention and paucity of intra-abdominal fat. Reproductive: Prostate is unremarkable. Other: No evidence of ascites allowing for limitations. No free air. Musculoskeletal: There are no acute or suspicious osseous abnormalities. IMPRESSION: Diffuse bowel distention throughout the abdomen involving stool-filled colon, fluid-filled small bowel, and ingested contents in the stomach. No discrete transition point is seen to suggest obstruction, however small bowel obstruction in the setting of chronic constipation is difficult to exclude. No evidence of bowel inflammation or perforation. Technically difficult bowel evaluation due to degree of distension and paucity of intra-abdominal fat. Electronically Signed   By: Jeb Levering M.D.   On: 03/07/2017 22:21    EKG: Independently reviewed.  Assessment/Plan Principal Problem:   Ileus ([place]) Active Problems:   Schizophrenia ([place])   Mental retardation   Constipation    1. Paralytic Ileus / obstruction / obstipation / going to perforate if this doesn't get resolved! - 1. NGT to be placed stat 2. Obviously given no forward movement, cant do PO laxatives (holding linzess and miralax) 3. Will do Fleets enema 4. IVF 5. Call surgery if not major improvement after NGT suctioning. 2. Schizophrenia - unfortunately will have to hold his cogentin and clozaril at this point as he is having  near perforation likely from the anticholingeric effects of these meds.  Expect psych symptoms to worsen however.  DVT prophylaxis: Lovenox Code Status: Full Family Communication: Family at bedside, [place] spoke with [employer] Disposition Plan: TBD Consults called: None Admission status: Admit to inpatient   Etta Quill DO [facility] Pager [phone]  If 7AM-7PM, please contact day team taking care of patient [email or web address] Password [email or web address]  03/07/2017, 10:55 PM

## 2017-03-07 NOTE — ED Notes (Signed)
Attempted report x1. 

## 2017-03-07 NOTE — ED Provider Notes (Signed)
[facility]    CSN: [id number] Arrival date & time: 03/07/17  1946     History   Chief Complaint Chief Complaint  Patient presents with  . Abdominal Pain    HPI Jordan Kennedy is a 61 y.o. male.   There is a 61 year old man with significant behavioral issues and who lives in a halfway house under supervision. He's on a variety of indications for his schizophrenia and mental retardation which have resulted in chronic constipation. He's had a history of bowel obstruction in the past.  Patient resents with about 8 hours of increasing abdominal pain, distention, vomiting, and loss of appetite.      Past Medical History:  Diagnosis Date  . Anemia   . BPH (benign prostatic hyperplasia)   . Mental retardation   . Schizophrenia ([place])    Monarche every three months;     Patient Active Problem List   Diagnosis Date Noted  . Anemia, iron deficiency 09/18/2015  . Constipation 04/25/2012  . Leukocytosis, unspecified 04/25/2012  . Anxiety and depression 04/25/2012  . 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 Schizophrenia ([place])   . Mental retardation     History reviewed. No pertinent surgical history.     Home Medications    Prior to Admission medications   Medication Sig Start Date End Date Taking? Authorizing Provider  aspirin 81 MG tablet Take 1 tablet (81 mg total) by mouth daily. 01/31/15   Wardell Honour, MD  benztropine (COGENTIN) 1 MG tablet Take 1 mg by mouth 3 (three) times daily.    [provider]  clonazePAM (KLONOPIN) 0.5 MG tablet Take 0.5 mg by mouth 2 (two) times daily.    [provider]  cloZAPine (CLOZARIL) 100 MG tablet Take 200-300 mg by mouth See admin instructions. Takes 200mg  every morning and 300mg  every night at bedtime    [provider]  PARoxetine (PAXIL) 20 MG tablet Take 20 mg by mouth every morning.    [provider]  polyethylene glycol powder (GLYCOLAX/MIRALAX) powder Mix 17 grams in 8 oz of water up to  3 x daily. You can take it 1-3 x daily. 11/21/15   Ladene Artist, MD  psyllium (METAMUCIL) 0.52 G capsule Take 2 capsules (1.04 g total) by mouth 2 (two) times daily. Patient taking differently: Take 1.04 g by mouth once.  09/14/15   Tereasa Coop, PA-C  tamsulosin (FLOMAX) 0.4 MG CAPS capsule Take 0.4 mg by mouth.    [provider]  Triamcinolone Acetonide (TRIAMCINOLONE 0.1 % CREAM : EUCERIN) CREA Apply 1 application topically at bedtime. 05/25/15   Wardell Honour, MD    Family History Family History  Problem Relation Age of Onset  . Cancer Mother        unknown type    Social History Social History  Substance Use Topics  . Smoking status: Current Every Day Smoker    Packs/day: 0.25    Years: 30.00    Types: Cigarettes  . Smokeless tobacco: Never Used  . Alcohol use No     Allergies   Patient has no known allergies.   Review of Systems Review of Systems  Constitutional: Positive for activity change and appetite change.  HENT: Negative.   Respiratory: Negative.   Cardiovascular: Negative.   Gastrointestinal: Positive for abdominal pain, constipation and vomiting.  Genitourinary: Negative.   Musculoskeletal: Negative.   Neurological: Negative.   All other systems reviewed and are negative.    Physical Exam Triage Vital Signs  ED Triage Vitals  Enc Vitals Group     BP      Pulse      Resp      Temp      Temp src      SpO2      Weight      Height      Head Circumference      Peak Flow      Pain Score      Pain Loc      Pain Edu?      Excl. in [place]?    No data found.   Updated Vital Signs BP (!) 148/97 (BP Location: Left Arm) Comment: notified rn  Pulse (!) 106 Comment: rn notified   Temp 97.5 F (36.4 C) (Oral)   Resp 16   SpO2 96%    Physical Exam  Constitutional: He is oriented to person, place, and time. He appears well-developed and well-nourished.  HENT:  Right Ear: External ear normal.  Left Ear: External ear normal.    Mouth/Throat: Oropharynx is clear and moist.  Eyes: Conjunctivae and EOM are normal. Pupils are equal, round, and reactive to light.  Neck: Normal range of motion. Neck supple.  Cardiovascular: Regular rhythm, normal heart sounds and intact distal pulses.   Pulmonary/Chest: Effort normal and breath sounds normal.  Abdominal: He exhibits distension. He exhibits no mass. There is tenderness. There is no guarding.  Tympanitic and tense abdominal wall with high-pitched bowel sounds  Musculoskeletal: Normal range of motion.  Neurological: He is alert and oriented to person, place, and time.  Skin: Skin is warm and dry.  Nursing note and vitals reviewed.    UC Treatments / Results  Labs (all labs ordered are listed, but only abnormal results are displayed) Labs Reviewed - No data to display  EKG  EKG Interpretation None       Radiology No results found.  Procedures Procedures (including critical care time)  Medications Ordered in UC Medications - No data to display   Initial Impression / Assessment and Plan / UC Course  I have reviewed the triage vital signs and the nursing notes.  Pertinent labs & imaging results that were available during my care of the patient were reviewed by me and considered in my medical decision making (see chart for details).     Final Clinical Impressions(s) / UC Diagnoses   Final diagnoses:  Partial intestinal obstruction, unspecified cause ([place])    New Prescriptions New Prescriptions   No medications on file  Patient moved to the emergency department for further evaluation   Robyn Haber, MD 03/07/17 2014

## 2017-03-07 NOTE — ED Triage Notes (Signed)
Pt  Has  History  Of  Bowel  Obstruction in  Past   And       Has   Vomited   X  2  Today        With  Foul      Smelling      Material    abd  Is  Distended     Ate    Decreased   Appetite   Last    bm   sev  Days  Ago

## 2017-03-07 NOTE — ED Notes (Signed)
Pt awarded to state due to MR, phone number for any medical questions is to Jordan Kennedy [phone]

## 2017-03-07 NOTE — ED Provider Notes (Signed)
[facility] DEPT Provider Note   CSN: [id number] Arrival date & time: 03/07/17  2022    History   Chief Complaint Chief Complaint  Patient presents with  . Abdominal Pain    HPI Jordan Kennedy is a 61 y.o. male.  Jordan Kennedy is a 61 year old male with significant behavioral issues and who lives in a halfway house under supervision. He's on a variety of medications for his schizophrenia and mental retardation which have resulted in chronic constipation. Patient presents to the emergency department from urgent care over concern for bowel obstruction. Last bowel movement was on 03/05/2017. Patient has had progressive abdominal distention over the past 2 days. This has most significantly worsened today. Patient also reports nausea with multiple episodes of emesis prior to arrival. He has not had any associated fevers. He does have a history of bowel obstruction in the past. No documented history of abdominal surgeries. No medications taken PTA for symptoms.      Past Medical History:  Diagnosis Date  . Anemia   . BPH (benign prostatic hyperplasia)   . Mental retardation   . Schizophrenia ([place])    Monarche every three months;     Patient Active Problem List   Diagnosis Date Noted  . Anemia, iron deficiency 09/18/2015  . Constipation 04/25/2012  . Leukocytosis, unspecified 04/25/2012  . Anxiety and depression 04/25/2012  . 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 Schizophrenia ([place])   . Mental retardation     History reviewed. No pertinent surgical history.     Home Medications    Prior to Admission medications   Medication Sig Start Date End Date Taking? Authorizing Provider  aspirin 81 MG tablet Take 1 tablet (81 mg total) by mouth daily. 01/31/15   Wardell Honour, MD  benztropine (COGENTIN) 1 MG tablet Take 1 mg by mouth 3 (three) times daily.    [provider]  clonazePAM (KLONOPIN) 0.5 MG tablet Take 0.5 mg by mouth 2 (two) times daily.    [provider]   cloZAPine (CLOZARIL) 100 MG tablet Take 200-300 mg by mouth See admin instructions. Takes 200mg  every morning and 300mg  every night at bedtime    [provider]  PARoxetine (PAXIL) 20 MG tablet Take 20 mg by mouth every morning.    [provider]  polyethylene glycol powder (GLYCOLAX/MIRALAX) powder Mix 17 grams in 8 oz of water up to 3 x daily. You can take it 1-3 x daily. 11/21/15   Ladene Artist, MD  psyllium (METAMUCIL) 0.52 G capsule Take 2 capsules (1.04 g total) by mouth 2 (two) times daily. Patient taking differently: Take 1.04 g by mouth once.  09/14/15   Tereasa Coop, PA-C  tamsulosin (FLOMAX) 0.4 MG CAPS capsule Take 0.4 mg by mouth.    [provider]  Triamcinolone Acetonide (TRIAMCINOLONE 0.1 % CREAM : EUCERIN) CREA Apply 1 application topically at bedtime. 05/25/15   Wardell Honour, MD    Family History Family History  Problem Relation Age of Onset  . Cancer Mother        unknown type    Social History Social History  Substance Use Topics  . Smoking status: Current Every Day Smoker    Packs/day: 0.25    Years: 30.00    Types: Cigarettes  . Smokeless tobacco: Never Used  . Alcohol use No     Allergies   Patient has no known allergies.   Review of Systems Review of Systems Ten systems reviewed and are negative for  acute change, except as noted in the HPI.    Physical Exam Updated Vital Signs BP (!) 152/96   Pulse 94   Temp 98.3 F (36.8 C) (Oral)   Resp (!) 21   SpO2 98%   Physical Exam  Constitutional: He is oriented to person, place, and time. He appears well-developed and well-nourished. No distress.  Nontoxic and in NAD  HENT:  Head: Normocephalic and atraumatic.  Eyes: Conjunctivae and EOM are normal. No scleral icterus.  Neck: Normal range of motion.  Cardiovascular: Regular rhythm and intact distal pulses.   Mild tachycardia  Pulmonary/Chest: Effort normal. No respiratory distress. He has no wheezes.    Respirations even and unlabored  Abdominal: Soft. He exhibits distension. There is tenderness (diffuse).  Significantly distended and diffusely tender abdomen. Abdomen is tympanitic. Hypoactive bowel sounds upper quadrants.  Musculoskeletal: Normal range of motion.  Neurological: He is alert and oriented to person, place, and time. He exhibits normal muscle tone. Coordination normal.  GCS 15. Patient answers questions appropriately and follows commands.  Skin: Skin is warm and dry. No rash noted. He is not diaphoretic. No erythema. No pallor.  Psychiatric: He has a normal mood and affect. His behavior is normal.  Nursing note and vitals reviewed.    ED Treatments / Results  Labs (all labs ordered are listed, but only abnormal results are displayed) Labs Reviewed  LIPASE, BLOOD - Abnormal; Notable for the following:       Result Value   Lipase 78 (*)    All other components within normal limits  COMPREHENSIVE METABOLIC PANEL - Abnormal; Notable for the following:    Glucose, Bld 117 (*)    All other components within normal limits  CBC - Abnormal; Notable for the following:    WBC 16.9 (*)    RDW 16.5 (*)    All other components within normal limits  URINALYSIS, ROUTINE W REFLEX MICROSCOPIC  I-STAT CG4 LACTIC ACID, ED    EKG  EKG Interpretation  Date/Time:  Thursday Mar 07 2017 21:37:42 EDT Ventricular Rate:  106 PR Interval:    QRS Duration: 90 QT Interval:  346 QTC Calculation: 409 R Axis:     Text Interpretation:  Sinus tachycardia Ventricular bigeminy Since prior ECG, patient is now in ventricular bigeminy Confirmed by [facility] MD, ERIN ([id number]) on 03/07/2017 9:41:32 PM       Radiology Dg Abdomen 1 View  Result Date: 03/07/2017 CLINICAL DATA:  Severe stomach pain sudden onset EXAM: ABDOMEN - 1 VIEW COMPARISON:  06/12/2012 FINDINGS: Visualized lung bases are grossly clear. Moderate diffuse gaseous dilatation of small and large bowel with gas in the rectum. No  abnormal calcifications. IMPRESSION: Moderate diffuse gaseous dilatation of small and large bowel, could indicate ileus with partial obstruction also a consideration. CT correlation may be helpful for further evaluation. Electronically Signed   By: Donavan Foil M.D.   On: 03/07/2017 21:02   Ct Abdomen Pelvis W Contrast  Result Date: 03/07/2017 CLINICAL DATA:  Abdominal pain and distension. EXAM: CT ABDOMEN AND PELVIS WITH CONTRAST TECHNIQUE: Multidetector CT imaging of the abdomen and pelvis was performed using the standard protocol following bolus administration of intravenous contrast. CONTRAST:  159mL ISOVUE-300 IOPAMIDOL (ISOVUE-300) INJECTION 61% COMPARISON:  Radiographs earlier this day.  CT 04/25/2012 FINDINGS: Lower chest: Mild dependent atelectasis at the lung bases. No pleural fluid. No consolidation. Hepatobiliary: Subcentimeter hypodensity in the right lobe is unchanged from prior exam. Small subcentimeter hypodensity adjacent to the middle hepatic vein is  grossly similar, partially obscured by motion. Small subcapsular 11 mm lesion in the right lobe is unchanged. No new hepatic lesion. Gallbladder physiologically distended, no calcified stone. No biliary dilatation. Pancreas: No peripancreatic inflammation. Prominence of the proximal pancreatic duct measuring 3.3 mm. Mild parenchymal atrophy. Spleen: Normal in size without focal abnormality. Adrenals/Urinary Tract: No adrenal nodule. No hydronephrosis. Homogeneous renal enhancement. There is symmetric renal excretion on delayed phase imaging. Urinary bladder is nondistended and not well evaluated. Stomach/Bowel: Marked gastric distention with ingested contents. Bowel evaluation is technically difficult given the diffuse GI tract distension. Small bowel is diffusely dilated and fluid-filled. No definite transition point. Large colonic stool burden throughout the colon which appears tortuous and difficult to define. Sigmoid colon and rectum with only  minimal stool. Appendix is not visualized due to diffuse bowel dilatation. There is no evidence of bowel wall thickening. No pneumatosis. No evidence of mesenteric twisting. Vascular/Lymphatic: No significant vascular findings are present. No enlarged abdominal or pelvic lymph nodes, evaluation for adenopathy is limited due to bowel distention and paucity of intra-abdominal fat. Reproductive: Prostate is unremarkable. Other: No evidence of ascites allowing for limitations. No free air. Musculoskeletal: There are no acute or suspicious osseous abnormalities. IMPRESSION: Diffuse bowel distention throughout the abdomen involving stool-filled colon, fluid-filled small bowel, and ingested contents in the stomach. No discrete transition point is seen to suggest obstruction, however small bowel obstruction in the setting of chronic constipation is difficult to exclude. No evidence of bowel inflammation or perforation. Technically difficult bowel evaluation due to degree of distension and paucity of intra-abdominal fat. Electronically Signed   By: Jeb Levering M.D.   On: 03/07/2017 22:21    Procedures Procedures (including critical care time)  Medications Ordered in ED Medications  sodium chloride 0.9 % bolus 1,000 mL (1,000 mLs Intravenous New Bag/Given 03/07/17 2143)  morphine 4 MG/ML injection 2 mg (2 mg Intravenous Given 03/07/17 2142)  ondansetron (ZOFRAN) injection 4 mg (4 mg Intravenous Given 03/07/17 2142)  iopamidol (ISOVUE-300) 61 % injection (100 mLs  Contrast Given 03/07/17 2152)    10:56 PM Spoke with Reva Bores who provided consent for placement of NG tube. Mr. Laurann Montana made aware of patient's need for admission. Mr. Laurann Montana expresses comfort and agreement with plan.   Initial Impression / Assessment and Plan / ED Course  I have reviewed the triage vital signs and the nursing notes.  Pertinent labs & imaging results that were available during my care of the patient were reviewed by me  and considered in my medical decision making (see chart for details).  Clinical Course as of Mar 07 2226  Thu Mar 07, 2017  2127 DG Abdomen 1 View [KH]  2128 DG Abdomen 1 View [KH]    Clinical Course User Index [KH] Antonietta Breach, [place]    Patient to be admitted for management of bowel obstruction secondary to paralytic ileus. Symptom onset 2 days ago; last BM on 03/05/17. Suspect ileus to be due to chronic use of psychiatric medications. NG tube placed in the emergency department. Thus far, patient has had 600 mL of fluid expelled. Case discussed with Dr. Alcario Drought of [facility] who will admit for additional management.   Final Clinical Impressions(s) / ED Diagnoses   Final diagnoses:  Intestinal obstruction, unspecified cause, unspecified whether partial or complete [facility])    New Prescriptions New Prescriptions   No medications on file     Antonietta Breach, Hershal Coria 03/07/17 2348    Gareth Morgan, MD 03/11/17 2308

## 2017-03-07 NOTE — ED Notes (Signed)
Alerted admitting provider about pt blood pressure, provider states not to give pt any medications at this time

## 2017-03-07 NOTE — ED Triage Notes (Signed)
Pt presents to ED from [facility] with hx of bowel obstruction.  Pt is distended, having nausea, and tenderness.

## 2017-03-07 NOTE — Discharge Instructions (Signed)
Go directly to the emergency room

## 2017-03-08 DIAGNOSIS — F209 Schizophrenia, unspecified: Secondary | ICD-10-CM

## 2017-03-08 DIAGNOSIS — F79 Unspecified intellectual disabilities: Secondary | ICD-10-CM

## 2017-03-08 DIAGNOSIS — K567 Ileus, unspecified: Secondary | ICD-10-CM

## 2017-03-08 DIAGNOSIS — K5901 Slow transit constipation: Secondary | ICD-10-CM

## 2017-03-08 LAB — CBC
HCT: 38.8 % — ABNORMAL LOW (ref 39.0–52.0)
Hemoglobin: 12.5 g/dL — ABNORMAL LOW (ref 13.0–17.0)
MCH: 30.9 pg (ref 26.0–34.0)
MCHC: 32.2 g/dL (ref 30.0–36.0)
MCV: 96 fL (ref 78.0–100.0)
Platelets: 295 10*3/uL (ref 150–400)
RBC: 4.04 MIL/uL — ABNORMAL LOW (ref 4.22–5.81)
RDW: 16.6 % — AB (ref 11.5–15.5)
WBC: 17.7 10*3/uL — AB (ref 4.0–10.5)

## 2017-03-08 LAB — BASIC METABOLIC PANEL
Anion gap: 7 (ref 5–15)
BUN: 12 mg/dL (ref 6–20)
CHLORIDE: 111 mmol/L (ref 101–111)
CO2: 24 mmol/L (ref 22–32)
CREATININE: 0.89 mg/dL (ref 0.61–1.24)
Calcium: 9 mg/dL (ref 8.9–10.3)
GFR calc non Af Amer: >60 mL/min (ref >60)
Glucose, Bld: 105 mg/dL — ABNORMAL HIGH (ref 65–99)
Potassium: 3.7 mmol/L (ref 3.5–5.1)
SODIUM: 142 mmol/L (ref 135–145)

## 2017-03-08 LAB — HIV ANTIBODY (ROUTINE TESTING W REFLEX): HIV SCREEN 4TH GENERATION: NONREACTIVE

## 2017-03-08 MED ORDER — CLOZAPINE 100 MG PO TABS
200.0000 mg | ORAL_TABLET | Freq: Every morning | ORAL | Status: DC
  Administered 2017-03-09 – 2017-03-12 (×4): 200 mg via ORAL
  Filled 2017-03-08 (×4): qty 2

## 2017-03-08 MED ORDER — CLOZAPINE 100 MG PO TABS: 100.0000 mg | ORAL_TABLET | ORAL | Status: DC

## 2017-03-08 MED ORDER — CLOZAPINE 100 MG PO TABS
200.0000 mg | ORAL_TABLET | Freq: Every day | ORAL | Status: DC
  Administered 2017-03-08 – 2017-03-12 (×5): 200 mg via ORAL
  Filled 2017-03-08 (×5): qty 2

## 2017-03-08 MED ORDER — BISACODYL 10 MG RE SUPP: 10.0000 mg | Freq: Every day | RECTAL | Status: DC | PRN

## 2017-03-08 MED ORDER — CLOZAPINE 100 MG PO TABS
100.0000 mg | ORAL_TABLET | Freq: Every day | ORAL | Status: DC
  Administered 2017-03-08 – 2017-03-09 (×2): 100 mg via ORAL
  Filled 2017-03-08 (×3): qty 1

## 2017-03-08 MED ORDER — BENZTROPINE MESYLATE 1 MG PO TABS
1.0000 mg | ORAL_TABLET | Freq: Three times a day (TID) | ORAL | Status: DC
  Administered 2017-03-08 – 2017-03-10 (×6): 1 mg via ORAL
  Filled 2017-03-08 (×7): qty 1

## 2017-03-08 MED ORDER — SORBITOL 70 % SOLN
960.0000 mL | TOPICAL_OIL | Freq: Once | ORAL | Status: AC
  Administered 2017-03-08: 960 mL via RECTAL
  Filled 2017-03-08: qty 240

## 2017-03-08 MED ORDER — NICOTINE 21 MG/24HR TD PT24
21.0000 mg | MEDICATED_PATCH | Freq: Every day | TRANSDERMAL | Status: DC
  Administered 2017-03-08 – 2017-03-12 (×5): 21 mg via TRANSDERMAL
  Filled 2017-03-08 (×5): qty 1

## 2017-03-08 NOTE — Progress Notes (Signed)
Notified Dr. Alcario Drought patient notified patient requesting to leave hospital. Attempts to contact care giver, Marshell Garfinkel, unsuccessful. Social worker, Surveyor, mining notified, gave additional care giver contact information for possible assist.

## 2017-03-08 NOTE — Progress Notes (Signed)
Patient removed peripheral IV and NG. Upon inquiry patient reports lines removed because he had to use the bathroom. Charge Nurse, Tracie Harrier and Dr. Alcario Drought informed.

## 2017-03-08 NOTE — Progress Notes (Signed)
Patient's care giver, Marshell Garfinkel, at bedside unable to encourage patient to comply with care regimen. Care giver reports, Social Worker, [place] will visit later this a.m.

## 2017-03-08 NOTE — Care Management Note (Signed)
Case Management Note  Patient Details  Name: Jordan Kennedy MRN: [id number] Date of Birth: 02-02-1956  Subjective/Objective:                    Action/Plan:  care giver, Marshell Garfinkel, [place] , Schizophrenia and MR, lives in halfway house under supervision, SBO unable to take meds  Expected Discharge Date:                  Expected Discharge Plan:  Group Home  In-House Referral:  Clinical Social Work  Discharge planning Services     Post Acute Care Choice:    Choice offered to:     DME Arranged:    DME Agency:     HH Arranged:    [place] Agency:     Status of Service:  In process, will continue to follow  If discussed at Long Length of Stay Meetings, dates discussed:    Additional Comments:  Marilu Favre, RN 03/08/2017, 10:14 AM

## 2017-03-08 NOTE — Progress Notes (Signed)
Patient admitted to [facility]03 arrived via cart. Left NG 16Fr intact and clamped. NG maintenance clarified by Dr. Hurley Cisco - LIWS. Dr. Alcario Drought aware of elevated B/P; no new related order at this time.

## 2017-03-08 NOTE — Progress Notes (Signed)
Care givers, Oneita Hurt and Marshell Garfinkel notified; will return to hospital to encourage patient's compliance with care. Dr. Alcario Drought and charge nurse Tracie Harrier aware of this update.

## 2017-03-08 NOTE — Progress Notes (Addendum)
PROGRESS NOTE    Jordan Kennedy  DEY:[id number] DOB: May 08, 1956 DOA: 03/07/2017 PCP: [name], MD   Brief Narrative:  Jordan Kennedy is a 61 y.o. male with medical history significant of Schizophrenia and MR, lives in halfway house under supervision.  On varity of meds which have resulted in chronic constipation.  Presented to ED from UC over concern for bowel obstruction.  Last BM was 5/15.  Progressive abd distention over past 2 days.  Worsened significantly today.  Nausea and multiple episodes of vomiting PTA.   Assessment & Plan:   Principal Problem:   Ileus ([place]) Active Problems:   Schizophrenia ([place])   Mental retardation   Constipation  #1 ileus versus bowel obstruction in the setting of chronic constipation CT findings with diffuse dilated small bowel without transition zone large amount of stool present. Patient had NG tube placed with 900 mL output however patient put out NG tube. Patient with no further nausea or vomiting. Will monitor closely of NG tube. If patient has any further nausea or vomiting with no improvement with constipation will need to replace NG tube. Will give patient a smog enema. Continue IV fluids, bowel rest. General surgical consultation has been obtained and recommending daily Dulcolax suppositories and enemas as needed. Follow.  #2 schizophrenia Patient is very fidgety with some pressured speech. Home regimen of Paxil has been resumed. Will resume Cogentin and Clozaril. Consult with psychiatry for management of medications.  #3 mental retardation  #4 BPH Continue Flomax.  #5 tobacco history Placed on nicotine patch. Tobacco cessation.  #6 leukocytosis Likely reactive leukocytosis. Check a UA with cultures and sensitivities. Patient afebrile. No need for antibiotics at this time.  Follow.   DVT prophylaxis: Lovenox Code Status: Full Family Communication: Updated patient. No family at bedside. Disposition Plan: Back to halfway house  when medically stable and improved.   Consultants:   General surgery 03/08/2017  Procedures:  CT abdomen and pelvis 03/07/2017  Abdominal films 03/07/2017  Antimicrobials:   None   Subjective: Patient with pressured speech. Patient very fidgety. Patient asking for cereal. Patient noted to have pulled out NG tube. Patient with no further nausea or emesis. Patient with no bowel movement.  Objective: Vitals:   03/07/17 2330 03/08/17 0011 03/08/17 0554 03/08/17 1409  BP: (!) 150/119 (!) 162/110 (!) 145/100 (!) 140/93  Pulse: 94 98 (!) 109 100  Resp: 20 18 19 18   Temp:  98.4 F (36.9 C) 99.1 F (37.3 C) 99.1 F (37.3 C)  TempSrc:  Oral Oral Oral  SpO2: 98% 98% 98% 98%    Intake/Output Summary (Last 24 hours) at 03/08/17 1556 Last data filed at 03/08/17 1401  Gross per 24 hour  Intake                0 ml  Output              254 ml  Net             -254 ml   There were no vitals filed for this visit.  Examination:  General exam: Patient with pressured speech. Patient fidgety Respiratory system: Clear to auscultation anterior lung fields. Respiratory effort normal. Cardiovascular system: S1 & S2 heard, RRR. No JVD, murmurs, rubs, gallops or clicks. No pedal edema. Gastrointestinal system: Abdomen is mildly distended, soft and nontender. No organomegaly or masses felt. Normal bowel sounds heard. Central nervous system: Alert and oriented. No focal neurological deficits. Extremities: Symmetric 5 x 5 power. Skin: No  rashes, lesions or ulcers Psychiatry: Judgement and insight appear poor. Patient fidgety. Patient with some pressured speech. Patient with flat affect..     Data Reviewed: I have personally reviewed following labs and imaging studies  CBC:  Recent Labs Lab 03/07/17 2031 03/08/17 0542  WBC 16.9* 17.7*  HGB 13.6 12.5*  HCT 41.8 38.8*  MCV 96.1 96.0  PLT 316 353   Basic Metabolic Panel:  Recent Labs Lab 03/07/17 2031 03/08/17 0542  NA 141  142  K 3.7 3.7  CL 108 111  CO2 24 24  GLUCOSE 117* 105*  BUN 13 12  CREATININE 0.98 0.89  CALCIUM 9.3 9.0   GFR: CrCl cannot be calculated (Unknown ideal weight.). Liver Function Tests:  Recent Labs Lab 03/07/17 2031  AST 26  ALT 31  ALKPHOS 83  BILITOT 0.5  PROT 7.3  ALBUMIN 4.2    Recent Labs Lab 03/07/17 2031  LIPASE 78*   No results for input(s): AMMONIA in the last 168 hours. Coagulation Profile: No results for input(s): INR, PROTIME in the last 168 hours. Cardiac Enzymes: No results for input(s): CKTOTAL, CKMB, CKMBINDEX, TROPONINI in the last 168 hours. BNP (last 3 results) No results for input(s): PROBNP in the last 8760 hours. HbA1C: No results for input(s): HGBA1C in the last 72 hours. CBG: No results for input(s): GLUCAP in the last 168 hours. Lipid Profile: No results for input(s): CHOL, HDL, LDLCALC, TRIG, CHOLHDL, LDLDIRECT in the last 72 hours. Thyroid Function Tests: No results for input(s): TSH, T4TOTAL, FREET4, T3FREE, THYROIDAB in the last 72 hours. Anemia Panel: No results for input(s): VITAMINB12, FOLATE, FERRITIN, TIBC, IRON, RETICCTPCT in the last 72 hours. Sepsis Labs:  Recent Labs Lab 03/07/17 2133  LATICACIDVEN 1.56    No results found for this or any previous visit (from the past 240 hour(s)).       Radiology Studies: Dg Abdomen 1 View  Result Date: 03/07/2017 CLINICAL DATA:  Severe stomach pain sudden onset EXAM: ABDOMEN - 1 VIEW COMPARISON:  06/12/2012 FINDINGS: Visualized lung bases are grossly clear. Moderate diffuse gaseous dilatation of small and large bowel with gas in the rectum. No abnormal calcifications. IMPRESSION: Moderate diffuse gaseous dilatation of small and large bowel, could indicate ileus with partial obstruction also a consideration. CT correlation may be helpful for further evaluation. Electronically Signed   By: Donavan Foil M.D.   On: 03/07/2017 21:02   Ct Abdomen Pelvis W Contrast  Result Date:  03/07/2017 CLINICAL DATA:  Abdominal pain and distension. EXAM: CT ABDOMEN AND PELVIS WITH CONTRAST TECHNIQUE: Multidetector CT imaging of the abdomen and pelvis was performed using the standard protocol following bolus administration of intravenous contrast. CONTRAST:  151mL ISOVUE-300 IOPAMIDOL (ISOVUE-300) INJECTION 61% COMPARISON:  Radiographs earlier this day.  CT 04/25/2012 FINDINGS: Lower chest: Mild dependent atelectasis at the lung bases. No pleural fluid. No consolidation. Hepatobiliary: Subcentimeter hypodensity in the right lobe is unchanged from prior exam. Small subcentimeter hypodensity adjacent to the middle hepatic vein is grossly similar, partially obscured by motion. Small subcapsular 11 mm lesion in the right lobe is unchanged. No new hepatic lesion. Gallbladder physiologically distended, no calcified stone. No biliary dilatation. Pancreas: No peripancreatic inflammation. Prominence of the proximal pancreatic duct measuring 3.3 mm. Mild parenchymal atrophy. Spleen: Normal in size without focal abnormality. Adrenals/Urinary Tract: No adrenal nodule. No hydronephrosis. Homogeneous renal enhancement. There is symmetric renal excretion on delayed phase imaging. Urinary bladder is nondistended and not well evaluated. Stomach/Bowel: Marked gastric distention with ingested contents.  Bowel evaluation is technically difficult given the diffuse GI tract distension. Small bowel is diffusely dilated and fluid-filled. No definite transition point. Large colonic stool burden throughout the colon which appears tortuous and difficult to define. Sigmoid colon and rectum with only minimal stool. Appendix is not visualized due to diffuse bowel dilatation. There is no evidence of bowel wall thickening. No pneumatosis. No evidence of mesenteric twisting. Vascular/Lymphatic: No significant vascular findings are present. No enlarged abdominal or pelvic lymph nodes, evaluation for adenopathy is limited due to bowel  distention and paucity of intra-abdominal fat. Reproductive: Prostate is unremarkable. Other: No evidence of ascites allowing for limitations. No free air. Musculoskeletal: There are no acute or suspicious osseous abnormalities. IMPRESSION: Diffuse bowel distention throughout the abdomen involving stool-filled colon, fluid-filled small bowel, and ingested contents in the stomach. No discrete transition point is seen to suggest obstruction, however small bowel obstruction in the setting of chronic constipation is difficult to exclude. No evidence of bowel inflammation or perforation. Technically difficult bowel evaluation due to degree of distension and paucity of intra-abdominal fat. Electronically Signed   By: Jeb Levering M.D.   On: 03/07/2017 22:21   Dg Abd Portable 1v  Result Date: 03/07/2017 CLINICAL DATA:  Encounter for gastric tube placement EXAM: PORTABLE ABDOMEN - 1 VIEW COMPARISON:  CT from 03/07/2017 FINDINGS: The diaphragms are excluded on the current exam. Despite this, it appears that the tip and side port of a gastric tube are noted in the left upper quadrant in the expected location of the stomach utilizing the ribs for landmarks with coronal images from the same day CT. Contrast is noted within the genitourinary system. A significant amount of gaseous distention and stool is noted throughout large bowel with food residue in the expected location of the stomach. IMPRESSION: The tip and side port of a gastric tube are noted in the left upper quadrant in the expected location of the stomach. Electronically Signed   By: Ashley Royalty M.D.   On: 03/07/2017 23:31        Scheduled Meds: . aspirin  81 mg Oral Daily  . enoxaparin (LOVENOX) injection  40 mg Subcutaneous Q24H  . mirabegron ER  50 mg Oral QPM  . nicotine  21 mg Transdermal Daily  . pantoprazole  80 mg Oral Daily  . PARoxetine  40 mg Oral QPM  . tamsulosin  0.4 mg Oral QPC supper   Continuous Infusions: . sodium chloride  125 mL/hr at 03/08/17 0037     LOS: 1 day    Time spent: 72 mins    Jordan Tuminello, MD [facility] Pager [phone]  If 7PM-7AM, please contact night-coverage [email or web address] Password [facility] 03/08/2017, 3:56 PM

## 2017-03-08 NOTE — Consult Note (Signed)
[facility] Consult Note  Jordan Kennedy 03-04-1956  [id number].    Requesting MD: Grandville Silos Chief Complaint/Reason for Consult: Concern for ileus vs. SBO in setting of chronic constipation HPI:  Patient is a 61 y.o. Male with PMH significant for MR, Schizophrenia, and chronic constipation. He presented to [facility] from an urgent care 5/17 after having 2 days of abdominal pain, distention, and N/V. Per legal guardian and caretaker at the home where patient lives, his last BM was 5/15. Patient has chronic issues with constipation that is related to his home medications. Takes linzess and PRN miralax at home. Patient had NGT placed last night with 900 cc output, but pulled NG out sometime last night/early this AM. Seen in [facility] back in 2013 for constipation which was resolved with enema. Patient's ability to provided history somewhat limited due to mental acuity/psychiatric condition.   ROS: Review of Systems  Unable to perform ROS: Mental acuity    Family History  Problem Relation Age of Onset  . Cancer Mother        unknown type    Past Medical History:  Diagnosis Date  . Anemia   . BPH (benign prostatic hyperplasia)   . Mental retardation   . Schizophrenia ([place])    Monarche every three months;     History reviewed. No pertinent surgical history.  Social History:  reports that he has been smoking Cigarettes.  He has a 7.50 pack-year smoking history. He has never used smokeless tobacco. He reports that he does not drink alcohol or use drugs.  Allergies: No Known Allergies  Medications Prior to Admission  Medication Sig Dispense Refill  . aspirin 81 MG tablet Take 1 tablet (81 mg total) by mouth daily. 100 tablet 3  . benztropine (COGENTIN) 1 MG tablet Take 1 mg by mouth 3 (three) times daily.    . cloZAPine (CLOZARIL) 100 MG tablet Take 100-200 mg by mouth See admin instructions. Takes 245m every morning and take 1 tablet at 5 pm then take 2073mevery night at bedtime     . linaclotide (LINZESS) 290 MCG CAPS capsule Take 290 mcg by mouth daily before breakfast.    . mirabegron ER (MYRBETRIQ) 50 MG TB24 tablet Take 50 mg by mouth every evening.    . Marland Kitchenmeprazole (PRILOSEC) 40 MG capsule Take 40 mg by mouth every evening.    . Marland KitchenARoxetine (PAXIL) 40 MG tablet Take 40 mg by mouth every evening.    . polyethylene glycol (MIRALAX / GLYCOLAX) packet Take 17 g by mouth daily as needed for mild constipation.    . tamsulosin (FLOMAX) 0.4 MG CAPS capsule Take 0.4 mg by mouth daily after supper.       Blood pressure (!) 145/100, pulse (!) 109, temperature 99.1 F (37.3 C), temperature source Oral, resp. rate 19, SpO2 98 %. Physical Exam: Physical Exam  Constitutional: He appears well-developed and well-nourished. He is cooperative. No distress.  HENT:  Head: Normocephalic and atraumatic.  Right Ear: External ear normal.  Left Ear: External ear normal.  Nose: Nose normal.  Mouth/Throat: Oropharynx is clear and moist and mucous membranes are normal.  Eyes: Conjunctivae and lids are normal. Pupils are equal, round, and reactive to light.  Neck: Trachea normal and normal range of motion. Neck supple.  Cardiovascular: Normal rate, regular rhythm, normal heart sounds and intact distal pulses.   Pulses:      Radial pulses are 2+ on the right side, and 2+ on the left side.  Dorsalis pedis pulses are 2+ on the right side, and 2+ on the left side.  Pulmonary/Chest: Effort normal and breath sounds normal. He has no wheezes. He has no rhonchi. He has no rales.  Abdominal: Normal appearance. He exhibits distension. He exhibits no shifting dullness and no mass. There is generalized tenderness.  Bowel sounds present in all quadrants, somewhat decreased. No surgical scars noted.  Musculoskeletal: Normal range of motion.  Neurological: He is alert. He has normal strength.  Skin: Skin is warm and dry. No rash noted. He is not diaphoretic.  Psychiatric: His mood appears  anxious.  Patient is cognitively impaired. Anxious appearing but cooperative. Fidgeting with hands.     Results for orders placed or performed during the hospital encounter of 03/07/17 (from the past 48 hour(s))  Lipase, blood     Status: Abnormal   Collection Time: 03/07/17  8:31 PM  Result Value Ref Range   Lipase 78 (H) 11 - 51 U/L  Comprehensive metabolic panel     Status: Abnormal   Collection Time: 03/07/17  8:31 PM  Result Value Ref Range   Sodium 141 135 - 145 mmol/L   Potassium 3.7 3.5 - 5.1 mmol/L   Chloride 108 101 - 111 mmol/L   CO2 24 22 - 32 mmol/L   Glucose, Bld 117 (H) 65 - 99 mg/dL   BUN 13 6 - 20 mg/dL   Creatinine, Ser 0.98 0.61 - 1.24 mg/dL   Calcium 9.3 8.9 - 10.3 mg/dL   Total Protein 7.3 6.5 - 8.1 g/dL   Albumin 4.2 3.5 - 5.0 g/dL   AST 26 15 - 41 U/L   ALT 31 17 - 63 U/L   Alkaline Phosphatase 83 38 - 126 U/L   Total Bilirubin 0.5 0.3 - 1.2 mg/dL   GFR calc non Af Amer >60 >60 mL/min   GFR calc Af Amer >60 >60 mL/min    Comment: (NOTE) The eGFR has been calculated using the CKD EPI equation. This calculation has not been validated in all clinical situations. eGFR's persistently <60 mL/min signify possible Chronic Kidney Disease.    Anion gap 9 5 - 15  CBC     Status: Abnormal   Collection Time: 03/07/17  8:31 PM  Result Value Ref Range   WBC 16.9 (H) 4.0 - 10.5 K/uL   RBC 4.35 4.22 - 5.81 MIL/uL   Hemoglobin 13.6 13.0 - 17.0 g/dL   HCT 41.8 39.0 - 52.0 %   MCV 96.1 78.0 - 100.0 fL   MCH 31.3 26.0 - 34.0 pg   MCHC 32.5 30.0 - 36.0 g/dL   RDW 16.5 (H) 11.5 - 15.5 %   Platelets 316 150 - 400 K/uL  I-Stat CG4 Lactic Acid, ED     Status: None   Collection Time: 03/07/17  9:33 PM  Result Value Ref Range   Lactic Acid, Venous 1.56 0.5 - 1.9 mmol/L  CBC     Status: Abnormal   Collection Time: 03/08/17  5:42 AM  Result Value Ref Range   WBC 17.7 (H) 4.0 - 10.5 K/uL   RBC 4.04 (L) 4.22 - 5.81 MIL/uL   Hemoglobin 12.5 (L) 13.0 - 17.0 g/dL   HCT  38.8 (L) 39.0 - 52.0 %   MCV 96.0 78.0 - 100.0 fL   MCH 30.9 26.0 - 34.0 pg   MCHC 32.2 30.0 - 36.0 g/dL   RDW 16.6 (H) 11.5 - 15.5 %   Platelets 295 150 - 400 K/uL  Basic metabolic panel     Status: Abnormal   Collection Time: 03/08/17  5:42 AM  Result Value Ref Range   Sodium 142 135 - 145 mmol/L   Potassium 3.7 3.5 - 5.1 mmol/L   Chloride 111 101 - 111 mmol/L   CO2 24 22 - 32 mmol/L   Glucose, Bld 105 (H) 65 - 99 mg/dL   BUN 12 6 - 20 mg/dL   Creatinine, Ser 7.99 0.61 - 1.24 mg/dL   Calcium 9.0 8.9 - 65.7 mg/dL   GFR calc non Af Amer >60 >60 mL/min   GFR calc Af Amer >60 >60 mL/min    Comment: (NOTE) The eGFR has been calculated using the CKD EPI equation. This calculation has not been validated in all clinical situations. eGFR's persistently <60 mL/min signify possible Chronic Kidney Disease.    Anion gap 7 5 - 15   Dg Abdomen 1 View  Result Date: 03/07/2017 CLINICAL DATA:  Severe stomach pain sudden onset EXAM: ABDOMEN - 1 VIEW COMPARISON:  06/12/2012 FINDINGS: Visualized lung bases are grossly clear. Moderate diffuse gaseous dilatation of small and large bowel with gas in the rectum. No abnormal calcifications. IMPRESSION: Moderate diffuse gaseous dilatation of small and large bowel, could indicate ileus with partial obstruction also a consideration. CT correlation may be helpful for further evaluation. Electronically Signed   By: Jasmine Pang M.D.   On: 03/07/2017 21:02   Ct Abdomen Pelvis W Contrast  Result Date: 03/07/2017 CLINICAL DATA:  Abdominal pain and distension. EXAM: CT ABDOMEN AND PELVIS WITH CONTRAST TECHNIQUE: Multidetector CT imaging of the abdomen and pelvis was performed using the standard protocol following bolus administration of intravenous contrast. CONTRAST:  ISOVUE-300 IOPAMIDOL (ISOVUE-300) INJECTION 61% COMPARISON:  Radiographs earlier this day.  CT 04/25/2012 FINDINGS: Lower chest: Mild dependent atelectasis at the lung bases. No pleural fluid.  No consolidation. Hepatobiliary: Subcentimeter hypodensity in the right lobe is unchanged from prior exam. Small subcentimeter hypodensity adjacent to the middle hepatic vein is grossly similar, partially obscured by motion. Small subcapsular 11 mm lesion in the right lobe is unchanged. No new hepatic lesion. Gallbladder physiologically distended, no calcified stone. No biliary dilatation. Pancreas: No peripancreatic inflammation. Prominence of the proximal pancreatic duct measuring 3.3 mm. Mild parenchymal atrophy. Spleen: Normal in size without focal abnormality. Adrenals/Urinary Tract: No adrenal nodule. No hydronephrosis. Homogeneous renal enhancement. There is symmetric renal excretion on delayed phase imaging. Urinary bladder is nondistended and not well evaluated. Stomach/Bowel: Marked gastric distention with ingested contents. Bowel evaluation is technically difficult given the diffuse GI tract distension. Small bowel is diffusely dilated and fluid-filled. No definite transition point. Large colonic stool burden throughout the colon which appears tortuous and difficult to define. Sigmoid colon and rectum with only minimal stool. Appendix is not visualized due to diffuse bowel dilatation. There is no evidence of bowel wall thickening. No pneumatosis. No evidence of mesenteric twisting. Vascular/Lymphatic: No significant vascular findings are present. No enlarged abdominal or pelvic lymph nodes, evaluation for adenopathy is limited due to bowel distention and paucity of intra-abdominal fat. Reproductive: Prostate is unremarkable. Other: No evidence of ascites allowing for limitations. No free air. Musculoskeletal: There are no acute or suspicious osseous abnormalities. IMPRESSION: Diffuse bowel distention throughout the abdomen involving stool-filled colon, fluid-filled small bowel, and ingested contents in the stomach. No discrete transition point is seen to suggest obstruction, however small bowel  obstruction in the setting of chronic constipation is difficult to exclude. No evidence of bowel inflammation or perforation.  Technically difficult bowel evaluation due to degree of distension and paucity of intra-abdominal fat. Electronically Signed   By: Jeb Levering M.D.   On: 03/07/2017 22:21   Dg Abd Portable 1v  Result Date: 03/07/2017 CLINICAL DATA:  Encounter for gastric tube placement EXAM: PORTABLE ABDOMEN - 1 VIEW COMPARISON:  CT from 03/07/2017 FINDINGS: The diaphragms are excluded on the current exam. Despite this, it appears that the tip and side port of a gastric tube are noted in the left upper quadrant in the expected location of the stomach utilizing the ribs for landmarks with coronal images from the same day CT. Contrast is noted within the genitourinary system. A significant amount of gaseous distention and stool is noted throughout large bowel with food residue in the expected location of the stomach. IMPRESSION: The tip and side port of a gastric tube are noted in the left upper quadrant in the expected location of the stomach. Electronically Signed   By: Ashley Royalty M.D.   On: 03/07/2017 23:31    Assessment/Plan Bowel obstruction in the setting of chronic constipation - CT findings showed diffusely dilated small bowel without transition zone and large amount of stool present throughout the colon - patient removed NG after 900 cc output - I would recommend holding off on reinserting unless patient begins vomiting again or fails to improve - IVF, NPO and bowel rest, would recommend minimizing opioid pain medications, daily dulcolax suppository  - enemas PRN  - appreciate consult, will continue to follow  [place], [facility] 03/08/2017, 9:44 AM Pager: [phone] Consults: [phone] Mon-Fri 7:00 am-4:30 pm Sat-Sun 7:00 am-11:30 am

## 2017-03-09 ENCOUNTER — Inpatient Hospital Stay (HOSPITAL_COMMUNITY): Payer: Medicare Other

## 2017-03-09 DIAGNOSIS — K5900 Constipation, unspecified: Secondary | ICD-10-CM

## 2017-03-09 LAB — BASIC METABOLIC PANEL
Anion gap: 6 (ref 5–15)
BUN: 8 mg/dL (ref 6–20)
CALCIUM: 8.5 mg/dL — AB (ref 8.9–10.3)
CHLORIDE: 109 mmol/L (ref 101–111)
CO2: 25 mmol/L (ref 22–32)
CREATININE: 0.84 mg/dL (ref 0.61–1.24)
GFR calc non Af Amer: >60 mL/min (ref >60)
Glucose, Bld: 101 mg/dL — ABNORMAL HIGH (ref 65–99)
Potassium: 3.5 mmol/L (ref 3.5–5.1)
SODIUM: 140 mmol/L (ref 135–145)

## 2017-03-09 LAB — CBC WITH DIFFERENTIAL/PLATELET
BASOS PCT: 0 %
Basophils Absolute: 0 10*3/uL (ref 0.0–0.1)
EOS ABS: 0.1 10*3/uL (ref 0.0–0.7)
EOS PCT: 1 %
HCT: 38.4 % — ABNORMAL LOW (ref 39.0–52.0)
Hemoglobin: 12.3 g/dL — ABNORMAL LOW (ref 13.0–17.0)
LYMPHS ABS: 2.4 10*3/uL (ref 0.7–4.0)
Lymphocytes Relative: 21 %
MCH: 30.3 pg (ref 26.0–34.0)
MCHC: 32 g/dL (ref 30.0–36.0)
MCV: 94.6 fL (ref 78.0–100.0)
MONOS PCT: 10 %
Monocytes Absolute: 1.1 10*3/uL — ABNORMAL HIGH (ref 0.1–1.0)
NEUTROS PCT: 68 %
Neutro Abs: 8 10*3/uL — ABNORMAL HIGH (ref 1.7–7.7)
PLATELETS: 282 10*3/uL (ref 150–400)
RBC: 4.06 MIL/uL — ABNORMAL LOW (ref 4.22–5.81)
RDW: 16 % — AB (ref 11.5–15.5)
WBC: 11.7 10*3/uL — ABNORMAL HIGH (ref 4.0–10.5)

## 2017-03-09 LAB — MAGNESIUM: MAGNESIUM: 2 mg/dL (ref 1.7–2.4)

## 2017-03-09 MED ORDER — SENNOSIDES-DOCUSATE SODIUM 8.6-50 MG PO TABS
1.0000 | ORAL_TABLET | Freq: Two times a day (BID) | ORAL | Status: DC
  Administered 2017-03-09 – 2017-03-12 (×7): 1 via ORAL
  Filled 2017-03-09 (×7): qty 1

## 2017-03-09 MED ORDER — POTASSIUM CHLORIDE CRYS ER 20 MEQ PO TBCR
40.0000 meq | EXTENDED_RELEASE_TABLET | Freq: Once | ORAL | Status: AC
  Administered 2017-03-09: 40 meq via ORAL
  Filled 2017-03-09: qty 2

## 2017-03-09 MED ORDER — POLYETHYLENE GLYCOL 3350 17 G PO PACK
17.0000 g | PACK | Freq: Two times a day (BID) | ORAL | Status: DC
  Administered 2017-03-09 – 2017-03-10 (×4): 17 g via ORAL
  Filled 2017-03-09 (×4): qty 1

## 2017-03-09 NOTE — Progress Notes (Signed)
Subjective/Chief Complaint: Hard to get subjective from pt; slightly pressured speech; denies abd pain,n/v Had multiple BMs   Objective: Vital signs in last 24 hours: Temp:  [98.2 F (36.8 C)-99.1 F (37.3 C)] 98.2 F (36.8 C) (05/19 0509) Pulse Rate:  [91-100] 92 (05/19 0509) Resp:  [18] 18 (05/19 0509) BP: (132-140)/(88-93) 137/88 (05/19 0509) SpO2:  [98 %] 98 % (05/19 0509) Last BM Date: 03/08/17  Intake/Output from previous day: 05/18 0701 - 05/19 0700 In: 3150.4 [P.O.:240; I.V.:2910.4] Out: 505 [Urine:500; Stool:5] Intake/Output this shift: Total I/O In: -  Out: 200 [Urine:200]  Awake, alert,  Soft, nd, nt  Lab Results:   Recent Labs  03/08/17 0542 03/09/17 0647  WBC 17.7* 11.7*  HGB 12.5* 12.3*  HCT 38.8* 38.4*  PLT 295 282   BMET  Recent Labs  03/08/17 0542 03/09/17 0647  NA 142 140  K 3.7 3.5  CL 111 109  CO2 24 25  GLUCOSE 105* 101*  BUN 12 8  CREATININE 0.89 0.84  CALCIUM 9.0 8.5*   PT/INR No results for input(s): LABPROT, INR in the last 72 hours. ABG No results for input(s): PHART, HCO3 in the last 72 hours.  Invalid input(s): PCO2, PO2  Studies/Results: Dg Abdomen 1 View  Result Date: 03/07/2017 CLINICAL DATA:  Severe stomach pain sudden onset EXAM: ABDOMEN - 1 VIEW COMPARISON:  06/12/2012 FINDINGS: Visualized lung bases are grossly clear. Moderate diffuse gaseous dilatation of small and large bowel with gas in the rectum. No abnormal calcifications. IMPRESSION: Moderate diffuse gaseous dilatation of small and large bowel, could indicate ileus with partial obstruction also a consideration. CT correlation may be helpful for further evaluation. Electronically Signed   By: Donavan Foil M.D.   On: 03/07/2017 21:02   Ct Abdomen Pelvis W Contrast  Result Date: 03/07/2017 CLINICAL DATA:  Abdominal pain and distension. EXAM: CT ABDOMEN AND PELVIS WITH CONTRAST TECHNIQUE: Multidetector CT imaging of the abdomen and pelvis was performed  using the standard protocol following bolus administration of intravenous contrast. CONTRAST:  196mL ISOVUE-300 IOPAMIDOL (ISOVUE-300) INJECTION 61% COMPARISON:  Radiographs earlier this day.  CT 04/25/2012 FINDINGS: Lower chest: Mild dependent atelectasis at the lung bases. No pleural fluid. No consolidation. Hepatobiliary: Subcentimeter hypodensity in the right lobe is unchanged from prior exam. Small subcentimeter hypodensity adjacent to the middle hepatic vein is grossly similar, partially obscured by motion. Small subcapsular 11 mm lesion in the right lobe is unchanged. No new hepatic lesion. Gallbladder physiologically distended, no calcified stone. No biliary dilatation. Pancreas: No peripancreatic inflammation. Prominence of the proximal pancreatic duct measuring 3.3 mm. Mild parenchymal atrophy. Spleen: Normal in size without focal abnormality. Adrenals/Urinary Tract: No adrenal nodule. No hydronephrosis. Homogeneous renal enhancement. There is symmetric renal excretion on delayed phase imaging. Urinary bladder is nondistended and not well evaluated. Stomach/Bowel: Marked gastric distention with ingested contents. Bowel evaluation is technically difficult given the diffuse GI tract distension. Small bowel is diffusely dilated and fluid-filled. No definite transition point. Large colonic stool burden throughout the colon which appears tortuous and difficult to define. Sigmoid colon and rectum with only minimal stool. Appendix is not visualized due to diffuse bowel dilatation. There is no evidence of bowel wall thickening. No pneumatosis. No evidence of mesenteric twisting. Vascular/Lymphatic: No significant vascular findings are present. No enlarged abdominal or pelvic lymph nodes, evaluation for adenopathy is limited due to bowel distention and paucity of intra-abdominal fat. Reproductive: Prostate is unremarkable. Other: No evidence of ascites allowing for limitations. No free air.  Musculoskeletal: There  are no acute or suspicious osseous abnormalities. IMPRESSION: Diffuse bowel distention throughout the abdomen involving stool-filled colon, fluid-filled small bowel, and ingested contents in the stomach. No discrete transition point is seen to suggest obstruction, however small bowel obstruction in the setting of chronic constipation is difficult to exclude. No evidence of bowel inflammation or perforation. Technically difficult bowel evaluation due to degree of distension and paucity of intra-abdominal fat. Electronically Signed   By: Jeb Levering M.D.   On: 03/07/2017 22:21   Dg Abd Portable 1v  Result Date: 03/07/2017 CLINICAL DATA:  Encounter for gastric tube placement EXAM: PORTABLE ABDOMEN - 1 VIEW COMPARISON:  CT from 03/07/2017 FINDINGS: The diaphragms are excluded on the current exam. Despite this, it appears that the tip and side port of a gastric tube are noted in the left upper quadrant in the expected location of the stomach utilizing the ribs for landmarks with coronal images from the same day CT. Contrast is noted within the genitourinary system. A significant amount of gaseous distention and stool is noted throughout large bowel with food residue in the expected location of the stomach. IMPRESSION: The tip and side port of a gastric tube are noted in the left upper quadrant in the expected location of the stomach. Electronically Signed   By: Ashley Royalty M.D.   On: 03/07/2017 23:31    Anti-infectives: Anti-infectives    None      Assessment/Plan: Ileus secondary to severe constipation  Had at least 8-9 BMs with bowel regimen Clinically ileus resolved If tolerates clears, can adv diet as tolerated Pt will need a bowel regimen on discharge - rec daily miralax  Signing off - pls  Call with questions No follow up needed with [facility]  Leighton Ruff. Redmond Pulling, MD, FACS General, Bariatric, & Minimally Invasive Surgery [facility], [place]   LOS: 2 days    Gayland Curry 03/09/2017

## 2017-03-09 NOTE — Progress Notes (Signed)
Pt caretaker Betsy Coder tel [phone] called for the update.

## 2017-03-09 NOTE — Progress Notes (Signed)
Pt transfer to x-ray. 

## 2017-03-09 NOTE — Progress Notes (Signed)
PROGRESS NOTE    Jordan Kennedy  GEZ:[id number] DOB: 12/09/1955 DOA: 03/07/2017 PCP: [name], MD   Brief Narrative:  Jordan Kennedy is a 61 y.o. male with medical history significant of Schizophrenia and MR, lives in halfway house under supervision.  On varity of meds which have resulted in chronic constipation.  Presented to ED from UC over concern for bowel obstruction.  Last BM was 5/15.  Progressive abd distention over past 2 days.  Worsened significantly today.  Nausea and multiple episodes of vomiting PTA.   Assessment & Plan:   Principal Problem:   Ileus ([place]) Active Problems:   Schizophrenia ([place])   Mental retardation   Constipation  #1 ileus versus bowel obstruction in the setting of chronic constipation CT findings with diffuse dilated small bowel without transition zone large amount of stool present. Patient had NG tube placed with 900 mL output however patient put out NG tube. Patient with no further nausea or vomiting. Patient with no further nausea or emesis overnight. Patient with significant large multiple bowel movements overnight with clinical improvement after enemas. Patient tolerating clears. Will place patient on a bowel regimen of MiraLAX twice daily and Senokot S twice daily. Advance diet as tolerated to a soft diet. Appreciate general surgical evaluation and recommendations. Dulcolax suppositories as needed.  #2 schizophrenia Patient with less pressured speech today and less fidgety after home regimen of Paxil, Cogentin, Clozaril were resumed. Psychiatric consultation pending.   #3 mental retardation  #4 BPH Continue Flomax.  #5 tobacco history Placed on nicotine patch. Tobacco cessation.  #6 leukocytosis Likely reactive leukocytosis. Urinalysis was ordered however never done. Leukocytosis trending down. Patient afebrile. No need for antibiotics at this time.  Follow.   DVT prophylaxis: Lovenox Code Status: Full Family Communication:  Updated patient. No family at bedside. Disposition Plan: Back to group home when medically stable and improved hopefully the next 24-48 hours.    Consultants:   General surgery 03/08/2017  Psychiatry pending  Procedures:  CT abdomen and pelvis 03/07/2017  Abdominal films 03/07/2017  Antimicrobials:   None   Subjective: Patient with less pressured speech. Patient less fidgety. Patient asking for solid diet. Patient noted to have multiple bowel movements overnight with no further abdominal pain. No nausea or vomiting.   Objective: Vitals:   03/08/17 0554 03/08/17 1409 03/08/17 2206 03/09/17 0509  BP: (!) 145/100 (!) 140/93 132/88 137/88  Pulse: (!) 109 100 91 92  Resp: 19 18 18 18   Temp: 99.1 F (37.3 C) 99.1 F (37.3 C) 98.6 F (37 C) 98.2 F (36.8 C)  TempSrc: Oral Oral Oral Oral  SpO2: 98% 98% 98% 98%    Intake/Output Summary (Last 24 hours) at 03/09/17 1208 Last data filed at 03/09/17 0931  Gross per 24 hour  Intake          3150.42 ml  Output             1102 ml  Net          2048.42 ml   There were no vitals filed for this visit.  Examination:  General exam: Patient with less pressured speech. Patient less fidgety Respiratory system: Clear to auscultation anterior lung fields. Respiratory effort normal. Cardiovascular system: S1 & S2 heard, RRR. No JVD, murmurs, rubs, gallops or clicks. No pedal edema. Gastrointestinal system: Abdomen is non distended, soft and nontender. No organomegaly or masses felt. Normal bowel sounds heard. Central nervous system: Alert and oriented. No focal neurological deficits. Extremities: Symmetric 5  x 5 power. Skin: No rashes, lesions or ulcers Psychiatry: Judgement and insight appear fair. Patient less fidgety. Patient with less pressured speech. Patient with flat affect..     Data Reviewed: I have personally reviewed following labs and imaging studies  CBC:  Recent Labs Lab 03/07/17 2031 03/08/17 0542  03/09/17 0647  WBC 16.9* 17.7* 11.7*  NEUTROABS  --   --  8.0*  HGB 13.6 12.5* 12.3*  HCT 41.8 38.8* 38.4*  MCV 96.1 96.0 94.6  PLT 316 295 176   Basic Metabolic Panel:  Recent Labs Lab 03/07/17 2031 03/08/17 0542 03/09/17 0647  NA 141 142 140  K 3.7 3.7 3.5  CL 108 111 109  CO2 24 24 25   GLUCOSE 117* 105* 101*  BUN 13 12 8   CREATININE 0.98 0.89 0.84  CALCIUM 9.3 9.0 8.5*  MG  --   --  2.0   GFR: CrCl cannot be calculated (Unknown ideal weight.). Liver Function Tests:  Recent Labs Lab 03/07/17 2031  AST 26  ALT 31  ALKPHOS 83  BILITOT 0.5  PROT 7.3  ALBUMIN 4.2    Recent Labs Lab 03/07/17 2031  LIPASE 78*   No results for input(s): AMMONIA in the last 168 hours. Coagulation Profile: No results for input(s): INR, PROTIME in the last 168 hours. Cardiac Enzymes: No results for input(s): CKTOTAL, CKMB, CKMBINDEX, TROPONINI in the last 168 hours. BNP (last 3 results) No results for input(s): PROBNP in the last 8760 hours. HbA1C: No results for input(s): HGBA1C in the last 72 hours. CBG: No results for input(s): GLUCAP in the last 168 hours. Lipid Profile: No results for input(s): CHOL, HDL, LDLCALC, TRIG, CHOLHDL, LDLDIRECT in the last 72 hours. Thyroid Function Tests: No results for input(s): TSH, T4TOTAL, FREET4, T3FREE, THYROIDAB in the last 72 hours. Anemia Panel: No results for input(s): VITAMINB12, FOLATE, FERRITIN, TIBC, IRON, RETICCTPCT in the last 72 hours. Sepsis Labs:  Recent Labs Lab 03/07/17 2133  LATICACIDVEN 1.56    No results found for this or any previous visit (from the past 240 hour(s)).       Radiology Studies: Dg Abdomen 1 View  Result Date: 03/07/2017 CLINICAL DATA:  Severe stomach pain sudden onset EXAM: ABDOMEN - 1 VIEW COMPARISON:  06/12/2012 FINDINGS: Visualized lung bases are grossly clear. Moderate diffuse gaseous dilatation of small and large bowel with gas in the rectum. No abnormal calcifications. IMPRESSION:  Moderate diffuse gaseous dilatation of small and large bowel, could indicate ileus with partial obstruction also a consideration. CT correlation may be helpful for further evaluation. Electronically Signed   By: Donavan Foil M.D.   On: 03/07/2017 21:02   Ct Abdomen Pelvis W Contrast  Result Date: 03/07/2017 CLINICAL DATA:  Abdominal pain and distension. EXAM: CT ABDOMEN AND PELVIS WITH CONTRAST TECHNIQUE: Multidetector CT imaging of the abdomen and pelvis was performed using the standard protocol following bolus administration of intravenous contrast. CONTRAST:  138mL ISOVUE-300 IOPAMIDOL (ISOVUE-300) INJECTION 61% COMPARISON:  Radiographs earlier this day.  CT 04/25/2012 FINDINGS: Lower chest: Mild dependent atelectasis at the lung bases. No pleural fluid. No consolidation. Hepatobiliary: Subcentimeter hypodensity in the right lobe is unchanged from prior exam. Small subcentimeter hypodensity adjacent to the middle hepatic vein is grossly similar, partially obscured by motion. Small subcapsular 11 mm lesion in the right lobe is unchanged. No new hepatic lesion. Gallbladder physiologically distended, no calcified stone. No biliary dilatation. Pancreas: No peripancreatic inflammation. Prominence of the proximal pancreatic duct measuring 3.3 mm. Mild parenchymal atrophy.  Spleen: Normal in size without focal abnormality. Adrenals/Urinary Tract: No adrenal nodule. No hydronephrosis. Homogeneous renal enhancement. There is symmetric renal excretion on delayed phase imaging. Urinary bladder is nondistended and not well evaluated. Stomach/Bowel: Marked gastric distention with ingested contents. Bowel evaluation is technically difficult given the diffuse GI tract distension. Small bowel is diffusely dilated and fluid-filled. No definite transition point. Large colonic stool burden throughout the colon which appears tortuous and difficult to define. Sigmoid colon and rectum with only minimal stool. Appendix is not  visualized due to diffuse bowel dilatation. There is no evidence of bowel wall thickening. No pneumatosis. No evidence of mesenteric twisting. Vascular/Lymphatic: No significant vascular findings are present. No enlarged abdominal or pelvic lymph nodes, evaluation for adenopathy is limited due to bowel distention and paucity of intra-abdominal fat. Reproductive: Prostate is unremarkable. Other: No evidence of ascites allowing for limitations. No free air. Musculoskeletal: There are no acute or suspicious osseous abnormalities. IMPRESSION: Diffuse bowel distention throughout the abdomen involving stool-filled colon, fluid-filled small bowel, and ingested contents in the stomach. No discrete transition point is seen to suggest obstruction, however small bowel obstruction in the setting of chronic constipation is difficult to exclude. No evidence of bowel inflammation or perforation. Technically difficult bowel evaluation due to degree of distension and paucity of intra-abdominal fat. Electronically Signed   By: Jeb Levering M.D.   On: 03/07/2017 22:21   Dg Abd 2 Views  Result Date: 03/09/2017 CLINICAL DATA:  Followup small bowel obstruction. EXAM: ABDOMEN - 2 VIEW COMPARISON:  03/07/2017 FINDINGS: There is no bowel dilation. The amount of bowel gas as well as gastric distention has decreased from the prior exams. There is no current evidence of a bowel obstruction. There is no free air. The soft tissues are unremarkable. IMPRESSION: 1. No convincing bowel obstruction. No free air. Improved appearance when compared to previous day's studies. Electronically Signed   By: Lajean Manes M.D.   On: 03/09/2017 11:19   Dg Abd Portable 1v  Result Date: 03/07/2017 CLINICAL DATA:  Encounter for gastric tube placement EXAM: PORTABLE ABDOMEN - 1 VIEW COMPARISON:  CT from 03/07/2017 FINDINGS: The diaphragms are excluded on the current exam. Despite this, it appears that the tip and side port of a gastric tube are noted  in the left upper quadrant in the expected location of the stomach utilizing the ribs for landmarks with coronal images from the same day CT. Contrast is noted within the genitourinary system. A significant amount of gaseous distention and stool is noted throughout large bowel with food residue in the expected location of the stomach. IMPRESSION: The tip and side port of a gastric tube are noted in the left upper quadrant in the expected location of the stomach. Electronically Signed   By: Ashley Royalty M.D.   On: 03/07/2017 23:31        Scheduled Meds: . aspirin  81 mg Oral Daily  . benztropine  1 mg Oral TID  . cloZAPine  100 mg Oral Daily  . cloZAPine  200 mg Oral q morning - 10a  . cloZAPine  200 mg Oral QHS  . enoxaparin (LOVENOX) injection  40 mg Subcutaneous Q24H  . mirabegron ER  50 mg Oral QPM  . nicotine  21 mg Transdermal Daily  . pantoprazole  80 mg Oral Daily  . PARoxetine  40 mg Oral QPM  . polyethylene glycol  17 g Oral BID  . senna-docusate  1 tablet Oral BID  . tamsulosin  0.4  mg Oral QPC supper   Continuous Infusions: . sodium chloride 1 mL (03/09/17 0931)     LOS: 2 days    Time spent: 24 mins    THOMPSON,DANIEL, MD [facility] Pager [phone]  If 7PM-7AM, please contact night-coverage [email or web address] Password [email or web address] 03/09/2017, 12:08 PM

## 2017-03-10 ENCOUNTER — Encounter (HOSPITAL_COMMUNITY): Payer: Self-pay

## 2017-03-10 ENCOUNTER — Inpatient Hospital Stay (HOSPITAL_COMMUNITY): Payer: Medicare Other

## 2017-03-10 DIAGNOSIS — F17210 Nicotine dependence, cigarettes, uncomplicated: Secondary | ICD-10-CM

## 2017-03-10 DIAGNOSIS — R140 Abdominal distension (gaseous): Secondary | ICD-10-CM

## 2017-03-10 LAB — BASIC METABOLIC PANEL
ANION GAP: 7 (ref 5–15)
BUN: 8 mg/dL (ref 6–20)
CALCIUM: 8.7 mg/dL — AB (ref 8.9–10.3)
CO2: 29 mmol/L (ref 22–32)
CREATININE: 0.89 mg/dL (ref 0.61–1.24)
Chloride: 107 mmol/L (ref 101–111)
Glucose, Bld: 105 mg/dL — ABNORMAL HIGH (ref 65–99)
Potassium: 3.6 mmol/L (ref 3.5–5.1)
Sodium: 143 mmol/L (ref 135–145)

## 2017-03-10 LAB — CBC WITH DIFFERENTIAL/PLATELET
BASOS PCT: 0 %
Basophils Absolute: 0 10*3/uL (ref 0.0–0.1)
EOS ABS: 0.1 10*3/uL (ref 0.0–0.7)
EOS PCT: 1 %
HCT: 37.5 % — ABNORMAL LOW (ref 39.0–52.0)
Hemoglobin: 11.9 g/dL — ABNORMAL LOW (ref 13.0–17.0)
Lymphocytes Relative: 25 %
Lymphs Abs: 3 10*3/uL (ref 0.7–4.0)
MCH: 30.4 pg (ref 26.0–34.0)
MCHC: 31.7 g/dL (ref 30.0–36.0)
MCV: 95.9 fL (ref 78.0–100.0)
MONO ABS: 1.2 10*3/uL — AB (ref 0.1–1.0)
Monocytes Relative: 10 %
Neutro Abs: 7.7 10*3/uL (ref 1.7–7.7)
Neutrophils Relative %: 64 %
PLATELETS: 288 10*3/uL (ref 150–400)
RBC: 3.91 MIL/uL — ABNORMAL LOW (ref 4.22–5.81)
RDW: 16.3 % — AB (ref 11.5–15.5)
WBC: 12 10*3/uL — ABNORMAL HIGH (ref 4.0–10.5)

## 2017-03-10 LAB — URINE CULTURE

## 2017-03-10 MED ORDER — POTASSIUM CHLORIDE 10 MEQ/100ML IV SOLN
10.0000 meq | INTRAVENOUS | Status: AC
  Administered 2017-03-10 (×3): 10 meq via INTRAVENOUS
  Filled 2017-03-10 (×3): qty 100

## 2017-03-10 MED ORDER — LORAZEPAM 2 MG/ML IJ SOLN: 0.5000 mg | Freq: Four times a day (QID) | INTRAMUSCULAR | Status: DC | PRN

## 2017-03-10 MED ORDER — LORAZEPAM 2 MG/ML IJ SOLN
0.5000 mg | Freq: Once | INTRAMUSCULAR | Status: AC
  Administered 2017-03-10: 0.5 mg via INTRAVENOUS

## 2017-03-10 MED ORDER — SORBITOL 70 % SOLN
960.0000 mL | TOPICAL_OIL | Freq: Once | ORAL | Status: AC
  Administered 2017-03-10: 960 mL via RECTAL
  Filled 2017-03-10: qty 240

## 2017-03-10 MED ORDER — SIMETHICONE 80 MG PO CHEW
160.0000 mg | CHEWABLE_TABLET | Freq: Four times a day (QID) | ORAL | Status: DC
  Administered 2017-03-10 – 2017-03-12 (×8): 160 mg via ORAL
  Filled 2017-03-10 (×8): qty 2

## 2017-03-10 MED ORDER — POTASSIUM CHLORIDE CRYS ER 20 MEQ PO TBCR
40.0000 meq | EXTENDED_RELEASE_TABLET | Freq: Once | ORAL | Status: AC
  Administered 2017-03-10: 40 meq via ORAL
  Filled 2017-03-10: qty 2

## 2017-03-10 MED ORDER — PAROXETINE HCL 10 MG PO TABS
30.0000 mg | ORAL_TABLET | Freq: Every evening | ORAL | Status: DC
  Administered 2017-03-10 – 2017-03-12 (×3): 30 mg via ORAL
  Filled 2017-03-10 (×3): qty 3

## 2017-03-10 MED ORDER — POTASSIUM CHLORIDE 10 MEQ/100ML IV SOLN
10.0000 meq | Freq: Once | INTRAVENOUS | Status: AC
  Administered 2017-03-10: 10 meq via INTRAVENOUS
  Filled 2017-03-10: qty 100

## 2017-03-10 MED ORDER — LORAZEPAM 2 MG/ML IJ SOLN
INTRAMUSCULAR | Status: AC
  Filled 2017-03-10: qty 1

## 2017-03-10 NOTE — Consult Note (Addendum)
[facility] Consult   Reason for Consult: History of Schizophrenia, needs medication recommendation Referring Physician: Dr. Grandville Silos Patient Identification: Jordan Kennedy MRN:  [id number] Principal Diagnosis: Ileus [facility]) Diagnosis:   Patient Active Problem List   Diagnosis Date Noted  . Ileus ([place]) [K56.7] 03/07/2017  . Anemia, iron deficiency [D50.9] 09/18/2015  . Constipation [K59.00] 04/25/2012  . Leukocytosis, unspecified [D72.829] 04/25/2012  . Anxiety and depression [F41.9, F32.9] 04/25/2012  . 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 Schizophrenia ([place]) [F20.9]   . Mental retardation [F79]     Total Time spent with patient: 45 minutes  Subjective:   Jordan Kennedy is a 61 y.o. male patient admitted with ileus and chronic constipation.  HPI:   Patient is a poor historian but he reports history of Schizophrenia and intellectual disability. He states that he lives in a  halfway house under supervision. He reports history of chronic constipation but says: ''I  was brought to the hospital because of intestinal obstruction, I pray I don't end up in surgery.'' Currently, patient appears anxious, listless but denies depression, psychosis, SI/HI or delusional thinking. I reviewed patient's medications and noted that most of his medications can cause constipations, patient reports that he has not moved bowel in the last few days.  Past Psychiatric History: as above  Risk to Self: Is patient at risk for suicide?: No Risk to Others:   Prior Inpatient Therapy:   Prior Outpatient Therapy:    Past Medical History:  Past Medical History:  Diagnosis Date  . Anemia   . BPH (benign prostatic hyperplasia)   . Mental retardation   . Schizophrenia ([place])    Monarche every three months;    History reviewed. No pertinent surgical history. Family History:  Family History  Problem Relation Age of Onset  . Cancer Mother        unknown type   Family Psychiatric  History:  Social  History:  History  Alcohol Use No     History  Drug Use No    Social History   Social History  . Marital status: Single    Spouse name: N/A  . Number of children: N/A  . Years of education: N/A   Social History Main Topics  . Smoking status: Current Every Day Smoker    Packs/day: 0.25    Years: 30.00    Types: Cigarettes  . Smokeless tobacco: Never Used  . Alcohol use No  . Drug use: No  . Sexual activity: No   Other Topics Concern  . None   Social History Narrative   Marital status: single      Children: none       Employment: disability due to mental retardation and schizophrenia.      Lives: group home [place]; [facility]; 3 men in group home.        Tobacco: six cigarettes per day; since age 88.      Alcohol:  None      Drugs: none      Exercise:  Walking some.      Seatbelt:  100%      Additional Social History:    Allergies:  No Known Allergies  Labs:  Results for orders placed or performed during the hospital encounter of 03/07/17 (from the past 48 hour(s))  Urine culture     Status: Abnormal   Collection Time: 03/09/17  4:06 AM  Result Value Ref Range   Specimen Description URINE, RANDOM    Special  Requests NONE    Culture MULTIPLE SPECIES PRESENT, SUGGEST RECOLLECTION (A)    Report Status 03/10/2017 FINAL   Basic metabolic panel     Status: Abnormal   Collection Time: 03/09/17  6:47 AM  Result Value Ref Range   Sodium 140 135 - 145 mmol/L   Potassium 3.5 3.5 - 5.1 mmol/L   Chloride 109 101 - 111 mmol/L   CO2 25 22 - 32 mmol/L   Glucose, Bld 101 (H) 65 - 99 mg/dL   BUN 8 6 - 20 mg/dL   Creatinine, Ser 0.84 0.61 - 1.24 mg/dL   Calcium 8.5 (L) 8.9 - 10.3 mg/dL   GFR calc non Af Amer >60 >60 mL/min   GFR calc Af Amer >60 >60 mL/min    Comment: (NOTE) The eGFR has been calculated using the CKD EPI equation. This calculation has not been validated in all clinical situations. eGFR's persistently <60 mL/min signify  possible Chronic Kidney Disease.    Anion gap 6 5 - 15  CBC with Differential/Platelet     Status: Abnormal   Collection Time: 03/09/17  6:47 AM  Result Value Ref Range   WBC 11.7 (H) 4.0 - 10.5 K/uL   RBC 4.06 (L) 4.22 - 5.81 MIL/uL   Hemoglobin 12.3 (L) 13.0 - 17.0 g/dL   HCT 38.4 (L) 39.0 - 52.0 %   MCV 94.6 78.0 - 100.0 fL   MCH 30.3 26.0 - 34.0 pg   MCHC 32.0 30.0 - 36.0 g/dL   RDW 16.0 (H) 11.5 - 15.5 %   Platelets 282 150 - 400 K/uL   Neutrophils Relative % 68 %   Neutro Abs 8.0 (H) 1.7 - 7.7 K/uL   Lymphocytes Relative 21 %   Lymphs Abs 2.4 0.7 - 4.0 K/uL   Monocytes Relative 10 %   Monocytes Absolute 1.1 (H) 0.1 - 1.0 K/uL   Eosinophils Relative 1 %   Eosinophils Absolute 0.1 0.0 - 0.7 K/uL   Basophils Relative 0 %   Basophils Absolute 0.0 0.0 - 0.1 K/uL  Magnesium     Status: None   Collection Time: 03/09/17  6:47 AM  Result Value Ref Range   Magnesium 2.0 1.7 - 2.4 mg/dL  CBC with Differential/Platelet     Status: Abnormal   Collection Time: 03/10/17  4:39 AM  Result Value Ref Range   WBC 12.0 (H) 4.0 - 10.5 K/uL   RBC 3.91 (L) 4.22 - 5.81 MIL/uL   Hemoglobin 11.9 (L) 13.0 - 17.0 g/dL   HCT 37.5 (L) 39.0 - 52.0 %   MCV 95.9 78.0 - 100.0 fL   MCH 30.4 26.0 - 34.0 pg   MCHC 31.7 30.0 - 36.0 g/dL   RDW 16.3 (H) 11.5 - 15.5 %   Platelets 288 150 - 400 K/uL   Neutrophils Relative % 64 %   Neutro Abs 7.7 1.7 - 7.7 K/uL   Lymphocytes Relative 25 %   Lymphs Abs 3.0 0.7 - 4.0 K/uL   Monocytes Relative 10 %   Monocytes Absolute 1.2 (H) 0.1 - 1.0 K/uL   Eosinophils Relative 1 %   Eosinophils Absolute 0.1 0.0 - 0.7 K/uL   Basophils Relative 0 %   Basophils Absolute 0.0 0.0 - 0.1 K/uL  Basic metabolic panel     Status: Abnormal   Collection Time: 03/10/17  4:39 AM  Result Value Ref Range   Sodium 143 135 - 145 mmol/L   Potassium 3.6 3.5 - 5.1 mmol/L   Chloride  107 101 - 111 mmol/L   CO2 29 22 - 32 mmol/L   Glucose, Bld 105 (H) 65 - 99 mg/dL   BUN 8 6 - 20  mg/dL   Creatinine, Ser 0.89 0.61 - 1.24 mg/dL   Calcium 8.7 (L) 8.9 - 10.3 mg/dL   GFR calc non Af Amer >60 >60 mL/min   GFR calc Af Amer >60 >60 mL/min    Comment: (NOTE) The eGFR has been calculated using the CKD EPI equation. This calculation has not been validated in all clinical situations. eGFR's persistently <60 mL/min signify possible Chronic Kidney Disease.    Anion gap 7 5 - 15    Current Facility-Administered Medications  Medication Dose Route Frequency Provider Last Rate Last Dose  . aspirin chewable tablet 81 mg  81 mg Oral Daily Etta Quill, DO   81 mg at 03/10/17 1046  . Benzocaine (HURRCAINE) 20 % mouth spray   Mouth/Throat QID PRN Gareth Morgan, MD   1 application at 40/10/27 2304  . benztropine (COGENTIN) tablet 1 mg  1 mg Oral TID Eugenie Filler, MD   1 mg at 03/10/17 1035  . bisacodyl (DULCOLAX) suppository 10 mg  10 mg Rectal Daily PRN Rayburn, Kelly A, PA-C      . cloZAPine (CLOZARIL) tablet 100 mg  100 mg Oral Daily Eugenie Filler, MD   100 mg at 03/09/17 1643  . cloZAPine (CLOZARIL) tablet 200 mg  200 mg Oral q morning - 10a Eugenie Filler, MD   200 mg at 03/10/17 1035  . cloZAPine (CLOZARIL) tablet 200 mg  200 mg Oral QHS Eugenie Filler, MD   200 mg at 03/10/17 0115  . enoxaparin (LOVENOX) injection 40 mg  40 mg Subcutaneous Q24H Jennette Kettle M, DO   40 mg at 03/10/17 1034  . mirabegron ER (MYRBETRIQ) tablet 50 mg  50 mg Oral QPM Jennette Kettle M, DO   50 mg at 03/09/17 1709  . nicotine (NICODERM CQ - dosed in mg/24 hours) patch 21 mg  21 mg Transdermal Daily Eugenie Filler, MD   21 mg at 03/10/17 1036  . pantoprazole (PROTONIX) EC tablet 80 mg  80 mg Oral Daily Jennette Kettle M, DO   80 mg at 03/10/17 1036  . PARoxetine (PAXIL) tablet 40 mg  40 mg Oral QPM Jennette Kettle M, DO   40 mg at 03/09/17 1710  . polyethylene glycol (MIRALAX / GLYCOLAX) packet 17 g  17 g Oral BID Eugenie Filler, MD   17 g at 03/10/17 1034  .  senna-docusate (Senokot-S) tablet 1 tablet  1 tablet Oral BID Eugenie Filler, MD   1 tablet at 03/10/17 1035  . simethicone (MYLICON) chewable tablet 160 mg  160 mg Oral QID Eugenie Filler, MD   160 mg at 03/10/17 1425  . sodium phosphate (FLEET) 7-19 GM/118ML enema 1 enema  1 enema Rectal Once PRN Etta Quill, DO      . sorbitol, milk of mag, mineral oil, glycerin (SMOG) enema  960 mL Rectal Once Eugenie Filler, MD      . tamsulosin [facility]) capsule 0.4 mg  0.4 mg Oral QPC supper Alcario Drought, Jared M, DO   0.4 mg at 03/09/17 1710    Musculoskeletal: Strength & Muscle Tone: within normal limits Gait & Station: unable to stand Patient leans: N/A  Psychiatric Specialty Exam: Physical Exam  Psychiatric: His speech is normal and behavior is normal. Judgment and thought content normal.  His mood appears anxious. Cognition and memory are normal.    Review of Systems  Constitutional: Positive for diaphoresis, malaise/fatigue and weight loss.  HENT: Negative.   Eyes: Negative.   Respiratory: Negative.   Cardiovascular: Negative.   Gastrointestinal: Positive for constipation.  Genitourinary: Negative.   Musculoskeletal: Negative.   Skin: Negative.   Neurological: Negative.   Endo/Heme/Allergies: Negative.   Psychiatric/Behavioral: Negative.     Blood pressure 131/90, pulse 89, temperature 98.6 F (37 C), temperature source Oral, resp. rate 17, SpO2 98 %.There is no height or weight on file to calculate BMI.  General Appearance: Casual  Eye Contact:  Good  Speech:  Clear and Coherent  Volume:  Normal  Mood:  Anxious  Affect:  Constricted  Thought Process:  Coherent  Orientation:  Full (Time, Place, and Person)  Thought Content:  Logical  Suicidal Thoughts:  No  Homicidal Thoughts:  No  Memory:  Immediate;   Fair Recent;   Fair Remote;   Fair  Judgement:  Intact  Insight:  Fair  Psychomotor Activity:  Restlessness  Concentration:  Concentration: Fair and Attention  Span: Fair  Recall:  [employer] of Knowledge:  Fair  Language:  Good  Akathisia:  No  Handed:  Right  AIMS (if indicated):     Assets:  Communication Skills  ADL's:  Intact  Cognition:  WNL  Sleep:   fair     Treatment Plan Summary: Patient with history of Schizophrenia, Intellectual disability who presents with chronic constipation and intestinal obstruction possibly due to some of his home medications, he is currently being evaluated and treated by surgeons.  Plan/Recommendation: -Discontinue Cogentin which can cause chronic constipation and paralytic ileus -Decrease Clozaril to 200 mg bid, may consider further decreased to 150 mg bid if there is no improvement in 3-5 days(Clozaril can cause constipation). -Decrease Paxil to 30 mg daily due to possible constipation side effect. -Re-consult psych service as needed.  Disposition: No evidence of psychiatric  imminent risk to self or others at present.   Patient does not meet criteria for psychiatric inpatient admission.  Corena Pilgrim, MD 03/10/2017 3:10 PM

## 2017-03-10 NOTE — NC FL2 (Signed)
[place] LEVEL OF CARE SCREENING TOOL     IDENTIFICATION  Patient Name: Jordan Kennedy Birthdate: [birth year] Sex: male Admission Date (Current Location): 03/07/2017  [place] and [place] Number:  Jordan Kennedy [id number] [place] and Address:  [facility] [place], [place] [address] [place], [place] [address]      Provider Number: [id number]  Attending Physician Name and Address:  Eugenie Filler, MD  Relative Name and Phone Number:       Current Level of Care: Hospital Recommended Level of Care: [facility] Prior Approval Number:    Date Approved/Denied:   PASRR Number:    Discharge Plan: Other (Comment) (Group Home)    Current Diagnoses: Patient Active Problem List   Diagnosis Date Noted  . Ileus ([place]) 03/07/2017  . Anemia, iron deficiency 09/18/2015  . Constipation 04/25/2012  . Leukocytosis, unspecified 04/25/2012  . Anxiety and depression 04/25/2012  . 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 Schizophrenia ([place])   . Mental retardation     Orientation RESPIRATION BLADDER Height & Weight     Self, Place  Normal Incontinent Weight:   Height:     BEHAVIORAL SYMPTOMS/MOOD NEUROLOGICAL BOWEL NUTRITION STATUS      Incontinent Diet  AMBULATORY STATUS COMMUNICATION OF NEEDS Skin   Limited Assist Verbally Normal                       Personal Care Assistance Level of Assistance  Bathing, Dressing Bathing Assistance: Limited assistance   Dressing Assistance: Limited assistance     Functional Limitations Info             SPECIAL CARE FACTORS FREQUENCY                       Contractures Contractures Info: Not present    Additional Factors Info  Code Status, Allergies Code Status Info: Full Code Allergies Info: No Known allergies           Current Medications (03/10/2017):  This is the current hospital active medication list Current Facility-Administered Medications  Medication Dose Route Frequency Provider Last Rate  Last Dose  . aspirin chewable tablet 81 mg  81 mg Oral Daily Etta Quill, DO   81 mg at 03/10/17 1046  . Benzocaine (HURRCAINE) 20 % mouth spray   Mouth/Throat QID PRN Gareth Morgan, MD   1 application at 88/89/16 2304  . benztropine (COGENTIN) tablet 1 mg  1 mg Oral TID Eugenie Filler, MD   1 mg at 03/10/17 1035  . bisacodyl (DULCOLAX) suppository 10 mg  10 mg Rectal Daily PRN Rayburn, Kelly A, PA-C      . cloZAPine (CLOZARIL) tablet 100 mg  100 mg Oral Daily Eugenie Filler, MD   100 mg at 03/09/17 1643  . cloZAPine (CLOZARIL) tablet 200 mg  200 mg Oral q morning - 10a Eugenie Filler, MD   200 mg at 03/10/17 1035  . cloZAPine (CLOZARIL) tablet 200 mg  200 mg Oral QHS Eugenie Filler, MD   200 mg at 03/10/17 0115  . enoxaparin (LOVENOX) injection 40 mg  40 mg Subcutaneous Q24H Jennette Kettle M, DO   40 mg at 03/10/17 1034  . mirabegron ER (MYRBETRIQ) tablet 50 mg  50 mg Oral QPM Jennette Kettle M, DO   50 mg at 03/09/17 1709  . nicotine (NICODERM CQ - dosed in mg/24 hours) patch 21 mg  21 mg Transdermal Daily Eugenie Filler,  MD   21 mg at 03/10/17 1036  . pantoprazole (PROTONIX) EC tablet 80 mg  80 mg Oral Daily Jennette Kettle M, DO   80 mg at 03/10/17 1036  . PARoxetine (PAXIL) tablet 40 mg  40 mg Oral QPM Jennette Kettle M, DO   40 mg at 03/09/17 1710  . polyethylene glycol (MIRALAX / GLYCOLAX) packet 17 g  17 g Oral BID Eugenie Filler, MD   17 g at 03/10/17 1034  . senna-docusate (Senokot-S) tablet 1 tablet  1 tablet Oral BID Eugenie Filler, MD   1 tablet at 03/10/17 1035  . simethicone (MYLICON) chewable tablet 160 mg  160 mg Oral QID Eugenie Filler, MD      . sodium phosphate (FLEET) 7-19 GM/118ML enema 1 enema  1 enema Rectal Once PRN Etta Quill, DO      . sorbitol, milk of mag, mineral oil, glycerin (SMOG) enema  960 mL Rectal Once Eugenie Filler, MD      . tamsulosin [facility]) capsule 0.4 mg  0.4 mg Oral QPC supper Etta Quill, DO   0.4 mg  at 03/09/17 1710     Discharge Medications: Please see discharge summary for a list of discharge medications.  Relevant Imaging Results:  Relevant Lab Results:   Additional Information SSN#  [id number]  Jordan Kennedy, [place]

## 2017-03-10 NOTE — Progress Notes (Signed)
PROGRESS NOTE    Jordan Kennedy  XKP:[id number] DOB: 24-Jan-1956 DOA: 03/07/2017 PCP: Jordan Docker, MD   Brief Narrative:  Jordan Kennedy is a 61 y.o. male with medical history significant of Schizophrenia and MR, lives in halfway house under supervision.  On varity of meds which have resulted in chronic constipation.  Presented to ED from UC over concern for bowel obstruction.  Last BM was 5/15.  Progressive abd distention over past 2 days.  Worsened significantly today.  Nausea and multiple episodes of vomiting PTA. On admission patient was given enemas improved clinically with large multiple bowel movements. Patient was placed on a diet which was advanced. On 03/10/2017 it was noted that patient had significant abdominal distention again with hypoactive bowel sounds. Patient was placed back on a clear liquid diet. Abdominal films ordered.   Assessment & Plan:   Principal Problem:   Ileus ([place]) Active Problems:   Schizophrenia ([place])   Mental retardation   Constipation  #1 ileus versus bowel obstruction in the setting of chronic constipation CT findings with diffuse dilated small bowel without transition zone large amount of stool present. Patient had NG tube placed with 900 mL output however patient put out NG tube. Patient with no further nausea or vomiting. Patient initially improved clinically with significant large multiple bowel movements 03/08/2017. Patient's diet was advanced which she tolerated. Patient maintained on a bowel regimen of MiraLAX twice daily and Senokot S twice daily.  Patient today with significant abdominal distention and hypoactive bowel sounds. Patient denies any nausea or emesis. Will place patient back on a clear liquid diet. Get abdominal films. Will give a small enema. If no significant improvement with abdominal distention and per findings of abdominal x-rays in the next 24 hours will have general surgery reassessed the patient.  #2  schizophrenia Patient with less pressured speech today and less fidgety however anxious, after home regimen of Paxil, Cogentin, Clozaril were resumed. Psychiatric consultation pending.   #3 mental retardation  #4 BPH Continue Flomax.  #5 tobacco history Placed on nicotine patch. Tobacco cessation.  #6 leukocytosis Likely reactive leukocytosis. Urinalysis was ordered however never done. Leukocytosis trending down. Patient afebrile. No need for antibiotics at this time.  Follow.   DVT prophylaxis: Lovenox Code Status: Full Family Communication: Updated patient. No family at bedside. Disposition Plan: Back to group home when medically stable and improved hopefully the next 24-48 hours.    Consultants:   General surgery 03/08/2017  Psychiatry pending  Procedures:  CT abdomen and pelvis 03/07/2017  Abdominal films 03/07/2017  Antimicrobials:   None   Subjective: Patient noted to have abdominal distention and per nursing home was noted overnight. Patient denies any nausea or emesis. Patient tolerating current diet. Patient denies any abdominal pain. Patient with no bowel movement today.   Objective: Vitals:   03/09/17 0509 03/09/17 1500 03/09/17 2017 03/10/17 0629  BP: 137/88 (!) 115/97 134/86 131/90  Pulse: 92 (!) 102 89   Resp: 18 18 17 17   Temp: 98.2 F (36.8 C) 98.5 F (36.9 C) 98.3 F (36.8 C) 98.6 F (37 C)  TempSrc: Oral Oral Oral Oral  SpO2: 98% 99% 98% 98%    Intake/Output Summary (Last 24 hours) at 03/10/17 1319 Last data filed at 03/10/17 1049  Gross per 24 hour  Intake          3437.33 ml  Output             2350 ml  Net  1087.33 ml   There were no vitals filed for this visit.  Examination:  General exam: Patient with less pressured speech. Patient Anxious. Respiratory system: Clear to auscultation anterior lung fields. Respiratory effort normal. Cardiovascular system: S1 & S2 heard, RRR. No JVD, murmurs, rubs, gallops or clicks. No  pedal edema. Gastrointestinal system: Abdomen is distended, tight and nontender. No organomegaly or masses felt. Hypoactive bowel sounds.  Central nervous system: Alert and oriented. No focal neurological deficits. Extremities: Symmetric 5 x 5 power. Skin: No rashes, lesions or ulcers Psychiatry: Judgement and insight appear fair. Patient less fidgety. Patient with less pressured speech. Patient with flat affect..     Data Reviewed: I have personally reviewed following labs and imaging studies  CBC:  Recent Labs Lab 03/07/17 2031 03/08/17 0542 03/09/17 0647 03/10/17 0439  WBC 16.9* 17.7* 11.7* 12.0*  NEUTROABS  --   --  8.0* 7.7  HGB 13.6 12.5* 12.3* 11.9*  HCT 41.8 38.8* 38.4* 37.5*  MCV 96.1 96.0 94.6 95.9  PLT 316 295 282 854   Basic Metabolic Panel:  Recent Labs Lab 03/07/17 2031 03/08/17 0542 03/09/17 0647 03/10/17 0439  NA 141 142 140 143  K 3.7 3.7 3.5 3.6  CL 108 111 109 107  CO2 24 24 25 29   GLUCOSE 117* 105* 101* 105*  BUN 13 12 8 8   CREATININE 0.98 0.89 0.84 0.89  CALCIUM 9.3 9.0 8.5* 8.7*  MG  --   --  2.0  --    GFR: CrCl cannot be calculated (Unknown ideal weight.). Liver Function Tests:  Recent Labs Lab 03/07/17 2031  AST 26  ALT 31  ALKPHOS 83  BILITOT 0.5  PROT 7.3  ALBUMIN 4.2    Recent Labs Lab 03/07/17 2031  LIPASE 78*   No results for input(s): AMMONIA in the last 168 hours. Coagulation Profile: No results for input(s): INR, PROTIME in the last 168 hours. Cardiac Enzymes: No results for input(s): CKTOTAL, CKMB, CKMBINDEX, TROPONINI in the last 168 hours. BNP (last 3 results) No results for input(s): PROBNP in the last 8760 hours. HbA1C: No results for input(s): HGBA1C in the last 72 hours. CBG: No results for input(s): GLUCAP in the last 168 hours. Lipid Profile: No results for input(s): CHOL, HDL, LDLCALC, TRIG, CHOLHDL, LDLDIRECT in the last 72 hours. Thyroid Function Tests: No results for input(s): TSH, T4TOTAL,  FREET4, T3FREE, THYROIDAB in the last 72 hours. Anemia Panel: No results for input(s): VITAMINB12, FOLATE, FERRITIN, TIBC, IRON, RETICCTPCT in the last 72 hours. Sepsis Labs:  Recent Labs Lab 03/07/17 2133  LATICACIDVEN 1.56    Recent Results (from the past 240 hour(s))  Urine culture     Status: Abnormal   Collection Time: 03/09/17  4:06 AM  Result Value Ref Range Status   Specimen Description URINE, RANDOM  Final   Special Requests NONE  Final   Culture MULTIPLE SPECIES PRESENT, SUGGEST RECOLLECTION (A)  Final   Report Status 03/10/2017 FINAL  Final         Radiology Studies: Dg Abd 2 Views  Result Date: 03/09/2017 CLINICAL DATA:  Followup small bowel obstruction. EXAM: ABDOMEN - 2 VIEW COMPARISON:  03/07/2017 FINDINGS: There is no bowel dilation. The amount of bowel gas as well as gastric distention has decreased from the prior exams. There is no current evidence of a bowel obstruction. There is no free air. The soft tissues are unremarkable. IMPRESSION: 1. No convincing bowel obstruction. No free air. Improved appearance when compared to previous day's  studies. Electronically Signed   By: Lajean Manes M.D.   On: 03/09/2017 11:19        Scheduled Meds: . aspirin  81 mg Oral Daily  . benztropine  1 mg Oral TID  . cloZAPine  100 mg Oral Daily  . cloZAPine  200 mg Oral q morning - 10a  . cloZAPine  200 mg Oral QHS  . enoxaparin (LOVENOX) injection  40 mg Subcutaneous Q24H  . mirabegron ER  50 mg Oral QPM  . nicotine  21 mg Transdermal Daily  . pantoprazole  80 mg Oral Daily  . PARoxetine  40 mg Oral QPM  . polyethylene glycol  17 g Oral BID  . senna-docusate  1 tablet Oral BID  . simethicone  160 mg Oral QID  . sorbitol, milk of mag, mineral oil, glycerin (SMOG) enema  960 mL Rectal Once  . tamsulosin  0.4 mg Oral QPC supper   Continuous Infusions:    LOS: 3 days    Time spent: 69 mins    THOMPSON,DANIEL, MD [facility] Pager [phone]  If  7PM-7AM, please contact night-coverage [email or web address] Password [email or web address] 03/10/2017, 1:19 PM

## 2017-03-10 NOTE — Clinical Social Work Note (Signed)
Per review of chart- patient is a current resident of a group home and he is a ward of the state ([place] [phone].)  Unable to reach guardian today to complete assessment. Patient is alert to person and some to place; he is developmentally delayed.  Fl2 initiated and placed on chart.  Patient is currently on a liquid diet and is not medically stable for d/c from the hospital. [facility] services will continue to monitor and complete assessment once guardian can be reached.  Lorie Phenix. Pauline Good, Ray  (weekend coverage)

## 2017-03-11 ENCOUNTER — Inpatient Hospital Stay (HOSPITAL_COMMUNITY): Payer: Medicare Other

## 2017-03-11 LAB — BASIC METABOLIC PANEL
ANION GAP: 12 (ref 5–15)
BUN: 12 mg/dL (ref 6–20)
CALCIUM: 9.5 mg/dL (ref 8.9–10.3)
CHLORIDE: 111 mmol/L (ref 101–111)
CO2: 19 mmol/L — AB (ref 22–32)
Creatinine, Ser: 0.87 mg/dL (ref 0.61–1.24)
GFR calc Af Amer: >60 mL/min (ref >60)
GFR calc non Af Amer: >60 mL/min (ref >60)
GLUCOSE: 147 mg/dL — AB (ref 65–99)
Potassium: 4.3 mmol/L (ref 3.5–5.1)
Sodium: 142 mmol/L (ref 135–145)

## 2017-03-11 LAB — CBC WITH DIFFERENTIAL/PLATELET
BASOS ABS: 0 10*3/uL (ref 0.0–0.1)
Basophils Relative: 0 %
Eosinophils Absolute: 0 10*3/uL (ref 0.0–0.7)
Eosinophils Relative: 0 %
HEMATOCRIT: 39.7 % (ref 39.0–52.0)
HEMOGLOBIN: 12.7 g/dL — AB (ref 13.0–17.0)
LYMPHS PCT: 5 %
Lymphs Abs: 0.9 10*3/uL (ref 0.7–4.0)
MCH: 31.2 pg (ref 26.0–34.0)
MCHC: 32 g/dL (ref 30.0–36.0)
MCV: 97.5 fL (ref 78.0–100.0)
MONO ABS: 2 10*3/uL — AB (ref 0.1–1.0)
MONOS PCT: 11 %
NEUTROS ABS: 15.9 10*3/uL — AB (ref 1.7–7.7)
NEUTROS PCT: 84 %
Platelets: 289 10*3/uL (ref 150–400)
RBC: 4.07 MIL/uL — ABNORMAL LOW (ref 4.22–5.81)
RDW: 16.7 % — AB (ref 11.5–15.5)
WBC: 19.1 10*3/uL — ABNORMAL HIGH (ref 4.0–10.5)

## 2017-03-11 LAB — MAGNESIUM: Magnesium: 2.1 mg/dL (ref 1.7–2.4)

## 2017-03-11 MED ORDER — SORBITOL 70 % SOLN
960.0000 mL | TOPICAL_OIL | Freq: Once | ORAL | Status: DC
  Filled 2017-03-11: qty 240

## 2017-03-11 MED ORDER — POLYETHYLENE GLYCOL 3350 17 G PO PACK
17.0000 g | PACK | Freq: Every day | ORAL | Status: DC
  Administered 2017-03-11 – 2017-03-12 (×2): 17 g via ORAL
  Filled 2017-03-11 (×2): qty 1

## 2017-03-11 NOTE — Care Management Note (Signed)
Case Management Note  Patient Details  Name: Jordan Kennedy MRN: [id number] Date of Birth: [birth year]  Subjective/Objective:                    Action/Plan:   Expected Discharge Date:                  Expected Discharge Plan:  Group Home  In-House Referral:  Clinical Social Work  Discharge planning Services     Post Acute Care Choice:    Choice offered to:     DME Arranged:    DME Agency:     HH Arranged:    [place] Agency:     Status of Service:  In process, will continue to follow  If discussed at Long Length of Stay Meetings, dates discussed:    Additional Comments:  Marilu Favre, RN 03/11/2017, 10:35 AM

## 2017-03-11 NOTE — Progress Notes (Signed)
Since receiving SMOG enema, pt has has 2 large BM's consisting of soft brown formed stool of medium caliber. Pt's abdomen is still distended

## 2017-03-11 NOTE — Progress Notes (Signed)
PROGRESS NOTE    Jordan Kennedy  ELF:[id number] DOB: 1956/03/20 DOA: 03/07/2017 PCP: [name], MD   Brief Narrative:  Jordan Kennedy is a 61 y.o. male with medical history significant of Schizophrenia and MR, lives in halfway house under supervision.  On varity of meds which have resulted in chronic constipation.  Presented to ED from UC over concern for bowel obstruction.  Last BM was 5/15.  Progressive abd distention over past 2 days.  Worsened significantly today.  Nausea and multiple episodes of vomiting PTA. On admission patient was given enemas improved clinically with large multiple bowel movements. Patient was placed on a diet which was advanced. On 03/10/2017 it was noted that patient had significant abdominal distention again with hypoactive bowel sounds. Patient was placed back on a clear liquid diet. Abdominal films ordered.   Assessment & Plan:   Principal Problem:   Ileus ([place]) Active Problems:   Schizophrenia ([place])   Mental retardation   Constipation   Abdominal distension  #1 ileus versus bowel obstruction in the setting of chronic constipation CT findings with diffuse dilated small bowel without transition zone large amount of stool present. Patient had NG tube placed with 900 mL output however patient put out NG tube. Patient with no further nausea or vomiting. Patient initially improved clinically with significant large multiple bowel movements 03/08/2017. Patient's diet was advanced which she tolerated. Patient maintained on a bowel regimen of MiraLAX twice daily and Senokot S twice daily.  Patient 03/10/2017 with significant abdominal distention and hypoactive bowel sounds. Patient denied any nausea or emesis. Patient subsequently made nothing by mouth. Patient given a smog enema with multiple bowel movements. Abdominal distention improved. Place on clear liquids. Continue bowel regimen of MiraLAX and Senokot-S. Outpatient follow-up.  #2  schizophrenia Patient with less pressured speech today and less fidgety however anxious. Paxil dose and Clozaril doses have been decreased per psychiatry. Cogentin has been discontinued as per psychiatry known to cause paralytic ileus and constipation. Outpatient follow-up.  #3 mental retardation  #4 BPH Continue Flomax.  #5 tobacco history Continue nicotine patch. Tobacco cessation.  #6 leukocytosis Likely reactive leukocytosis. Likely stress-induced. Urinalysis was ordered however never done. Patient afebrile. No need for antibiotics at this time.  Follow.   DVT prophylaxis: Lovenox Code Status: Full Family Communication: Updated patient. No family at bedside. Disposition Plan: Back to group home when medically stable and improved hopefully the next 24-48 hours.    Consultants:   General surgery 03/08/2017  Psychiatry: Dr Darleene Cleaver 03/10/2017  Procedures:  CT abdomen and pelvis 03/07/2017  Abdominal films 03/07/2017, 03/10/2017, 03/11/2017    Antimicrobials:   None   Subjective: Patient noted to have multiple bowel movements after enema yesterday. Patient with improved abdominal distention. No nausea. No vomiting.   Objective: Vitals:   03/10/17 1700 03/10/17 1810 03/10/17 2107 03/11/17 0505  BP:  (!) 141/92 138/88 (!) 141/87  Pulse:  (!) 122 90 92  Resp:  18 17 18   Temp:  98 F (36.7 C) 98.3 F (36.8 C) 98.2 F (36.8 C)  TempSrc:  Axillary Axillary Oral  SpO2:  95% 94% 95%  Weight: 87.1 kg (192 lb 0.3 oz)     Height: 6\' 3"  (1.905 m)       Intake/Output Summary (Last 24 hours) at 03/11/17 0920 Last data filed at 03/11/17 0902  Gross per 24 hour  Intake              702 ml  Output  350 ml  Net              352 ml   Filed Weights   03/10/17 1700  Weight: 87.1 kg (192 lb 0.3 oz)    Examination:  General exam: Patient with less pressured speech. Patient Anxious. Respiratory system: Clear to auscultation anterior lung fields.  Respiratory effort normal. Cardiovascular system: S1 & S2 heard, RRR. No JVD, murmurs, rubs, gallops or clicks. No pedal edema. Gastrointestinal system: Abdomen is less distended, less tight and nontender. No organomegaly or masses felt. Hypoactive bowel sounds.  Central nervous system: Alert and oriented. No focal neurological deficits. Extremities: Symmetric 5 x 5 power. Skin: No rashes, lesions or ulcers Psychiatry: Judgement and insight appear fair. Patient less fidgety. Patient with less pressured speech. Patient with flat affect..     Data Reviewed: I have personally reviewed following labs and imaging studies  CBC:  Recent Labs Lab 03/07/17 2031 03/08/17 0542 03/09/17 0647 03/10/17 0439 03/11/17 0548  WBC 16.9* 17.7* 11.7* 12.0* 19.1*  NEUTROABS  --   --  8.0* 7.7 15.9*  HGB 13.6 12.5* 12.3* 11.9* 12.7*  HCT 41.8 38.8* 38.4* 37.5* 39.7  MCV 96.1 96.0 94.6 95.9 97.5  PLT 316 295 282 288 629   Basic Metabolic Panel:  Recent Labs Lab 03/07/17 2031 03/08/17 0542 03/09/17 0647 03/10/17 0439 03/11/17 0548  NA 141 142 140 143 142  K 3.7 3.7 3.5 3.6 4.3  CL 108 111 109 107 111  CO2 24 24 25 29  19*  GLUCOSE 117* 105* 101* 105* 147*  BUN 13 12 8 8 12   CREATININE 0.98 0.89 0.84 0.89 0.87  CALCIUM 9.3 9.0 8.5* 8.7* 9.5  MG  --   --  2.0  --  2.1   GFR: Estimated Creatinine Clearance: 110.6 mL/min (by C-G formula based on SCr of 0.87 mg/dL). Liver Function Tests:  Recent Labs Lab 03/07/17 2031  AST 26  ALT 31  ALKPHOS 83  BILITOT 0.5  PROT 7.3  ALBUMIN 4.2    Recent Labs Lab 03/07/17 2031  LIPASE 78*   No results for input(s): AMMONIA in the last 168 hours. Coagulation Profile: No results for input(s): INR, PROTIME in the last 168 hours. Cardiac Enzymes: No results for input(s): CKTOTAL, CKMB, CKMBINDEX, TROPONINI in the last 168 hours. BNP (last 3 results) No results for input(s): PROBNP in the last 8760 hours. HbA1C: No results for input(s):  HGBA1C in the last 72 hours. CBG: No results for input(s): GLUCAP in the last 168 hours. Lipid Profile: No results for input(s): CHOL, HDL, LDLCALC, TRIG, CHOLHDL, LDLDIRECT in the last 72 hours. Thyroid Function Tests: No results for input(s): TSH, T4TOTAL, FREET4, T3FREE, THYROIDAB in the last 72 hours. Anemia Panel: No results for input(s): VITAMINB12, FOLATE, FERRITIN, TIBC, IRON, RETICCTPCT in the last 72 hours. Sepsis Labs:  Recent Labs Lab 03/07/17 2133  LATICACIDVEN 1.56    Recent Results (from the past 240 hour(s))  Urine culture     Status: Abnormal   Collection Time: 03/09/17  4:06 AM  Result Value Ref Range Status   Specimen Description URINE, RANDOM  Final   Special Requests NONE  Final   Culture MULTIPLE SPECIES PRESENT, SUGGEST RECOLLECTION (A)  Final   Report Status 03/10/2017 FINAL  Final         Radiology Studies: Dg Abd 2 Views  Result Date: 03/11/2017 CLINICAL DATA:  61 year old male with midline abdominal pain for 2 days with nausea vomiting and diarrhea. EXAM: ABDOMEN -  2 VIEW COMPARISON:  03/10/2017 and earlier, including CT Abdomen and Pelvis 03/07/2017, and also CT Abdomen and Pelvis 04/25/2012. FINDINGS: Upright and supine views. Multiple gas and fluid distended small bowel loops persist throughout the abdomen with multiple air-fluid levels on the upright view. Small bowel loops up to 53 mm diameter are evident, stable from the recent CT, and also similar to the 2013 CT Abdomen and Pelvis. No free air. Grossly negative lung bases. No acute osseous abnormality identified. IMPRESSION: 1. Unchanged bowel gas pattern since the CT on 03/07/2017, and also similar to a CT Abdomen and Pelvis on 04/25/2012. Suspect chronic decreased bowel inertia. 2. No free air. Electronically Signed   By: Genevie Ann M.D.   On: 03/11/2017 08:33   Dg Abd 2 Views  Result Date: 03/10/2017 CLINICAL DATA:  Abdominal distension EXAM: ABDOMEN - 2 VIEW COMPARISON:  03/09/2017 FINDINGS:  Scattered large and small bowel gas is noted. Fecal material is noted throughout the colon. The overall appearance is stable. Distension of the stomach is noted with air. No free air is seen. IMPRESSION: Changes suggestive of a diffuse ileus.  No free air is noted. Electronically Signed   By: Inez Catalina M.D.   On: 03/10/2017 18:05   Dg Abd 2 Views  Result Date: 03/09/2017 CLINICAL DATA:  Followup small bowel obstruction. EXAM: ABDOMEN - 2 VIEW COMPARISON:  03/07/2017 FINDINGS: There is no bowel dilation. The amount of bowel gas as well as gastric distention has decreased from the prior exams. There is no current evidence of a bowel obstruction. There is no free air. The soft tissues are unremarkable. IMPRESSION: 1. No convincing bowel obstruction. No free air. Improved appearance when compared to previous day's studies. Electronically Signed   By: Lajean Manes M.D.   On: 03/09/2017 11:19        Scheduled Meds: . aspirin  81 mg Oral Daily  . cloZAPine  200 mg Oral q morning - 10a  . cloZAPine  200 mg Oral QHS  . enoxaparin (LOVENOX) injection  40 mg Subcutaneous Q24H  . mirabegron ER  50 mg Oral QPM  . nicotine  21 mg Transdermal Daily  . pantoprazole  80 mg Oral Daily  . PARoxetine  30 mg Oral QPM  . polyethylene glycol  17 g Oral BID  . senna-docusate  1 tablet Oral BID  . simethicone  160 mg Oral QID  . sorbitol, milk of mag, mineral oil, glycerin (SMOG) enema  960 mL Rectal Once  . tamsulosin  0.4 mg Oral QPC supper   Continuous Infusions:    LOS: 4 days    Time spent: 70 mins    THOMPSON,DANIEL, MD [facility] Pager [phone]  If 7PM-7AM, please contact night-coverage [email or web address] Password [email or web address] 03/11/2017, 9:20 AM

## 2017-03-11 NOTE — Care Management Important Message (Signed)
Important Message  Patient Details  Name: Jordan Kennedy MRN: [id number] Date of Birth: 1956/01/15   Medicare Important Message Given:  Yes    Allisson Schindel 03/11/2017, 1:12 PM

## 2017-03-12 LAB — BASIC METABOLIC PANEL
ANION GAP: 7 (ref 5–15)
BUN: 7 mg/dL (ref 6–20)
CALCIUM: 8.8 mg/dL — AB (ref 8.9–10.3)
CO2: 25 mmol/L (ref 22–32)
Chloride: 109 mmol/L (ref 101–111)
Creatinine, Ser: 0.89 mg/dL (ref 0.61–1.24)
GFR calc non Af Amer: >60 mL/min (ref >60)
Glucose, Bld: 159 mg/dL — ABNORMAL HIGH (ref 65–99)
Potassium: 3.6 mmol/L (ref 3.5–5.1)
Sodium: 141 mmol/L (ref 135–145)

## 2017-03-12 LAB — MAGNESIUM: Magnesium: 1.8 mg/dL (ref 1.7–2.4)

## 2017-03-12 LAB — CBC WITH DIFFERENTIAL/PLATELET
BASOS ABS: 0 10*3/uL (ref 0.0–0.1)
BASOS PCT: 0 %
Eosinophils Absolute: 0.1 10*3/uL (ref 0.0–0.7)
Eosinophils Relative: 1 %
HEMATOCRIT: 35.6 % — AB (ref 39.0–52.0)
HEMOGLOBIN: 11.6 g/dL — AB (ref 13.0–17.0)
Lymphocytes Relative: 17 %
Lymphs Abs: 2.1 10*3/uL (ref 0.7–4.0)
MCH: 31.2 pg (ref 26.0–34.0)
MCHC: 32.6 g/dL (ref 30.0–36.0)
MCV: 95.7 fL (ref 78.0–100.0)
MONOS PCT: 8 %
Monocytes Absolute: 1 10*3/uL (ref 0.1–1.0)
NEUTROS ABS: 9.4 10*3/uL — AB (ref 1.7–7.7)
Neutrophils Relative %: 74 %
Platelets: 286 10*3/uL (ref 150–400)
RBC: 3.72 MIL/uL — AB (ref 4.22–5.81)
RDW: 16.1 % — ABNORMAL HIGH (ref 11.5–15.5)
WBC: 12.6 10*3/uL — ABNORMAL HIGH (ref 4.0–10.5)

## 2017-03-12 MED ORDER — PAROXETINE HCL 30 MG PO TABS: 30.0000 mg | ORAL_TABLET | Freq: Every evening | ORAL | 1 refills | Status: DC

## 2017-03-12 MED ORDER — CLOZAPINE 100 MG PO TABS: 200.0000 mg | ORAL_TABLET | Freq: Two times a day (BID) | ORAL | 0 refills | Status: DC

## 2017-03-12 MED ORDER — MAGNESIUM SULFATE 4 GM/100ML IV SOLN
4.0000 g | Freq: Once | INTRAVENOUS | Status: AC
  Administered 2017-03-12: 4 g via INTRAVENOUS
  Filled 2017-03-12: qty 100

## 2017-03-12 MED ORDER — BISACODYL 10 MG RE SUPP: 10.0000 mg | Freq: Every day | RECTAL | 0 refills | Status: DC | PRN

## 2017-03-12 MED ORDER — POTASSIUM CHLORIDE CRYS ER 20 MEQ PO TBCR
40.0000 meq | EXTENDED_RELEASE_TABLET | Freq: Once | ORAL | Status: AC
  Administered 2017-03-12: 40 meq via ORAL
  Filled 2017-03-12: qty 2

## 2017-03-12 MED ORDER — NICOTINE 21 MG/24HR TD PT24: 21.0000 mg | MEDICATED_PATCH | Freq: Every day | TRANSDERMAL | 0 refills | Status: DC

## 2017-03-12 MED ORDER — SENNOSIDES-DOCUSATE SODIUM 8.6-50 MG PO TABS
1.0000 | ORAL_TABLET | Freq: Two times a day (BID) | ORAL | Status: DC
Start: 2017-03-12 — End: 2018-07-09

## 2017-03-12 MED ORDER — POLYETHYLENE GLYCOL 3350 17 G PO PACK: 17.0000 g | PACK | Freq: Every day | ORAL | 0 refills | Status: DC

## 2017-03-12 NOTE — Progress Notes (Signed)
Patient discharged to group home picked up by Marshell Garfinkel (caregiver) in stable condition. Discharge papers and  instructions given. All questions and concerns answered.

## 2017-03-12 NOTE — Clinical Social Work Note (Signed)
Clinical Social Work Assessment  Patient Details  Name: Jordan Kennedy MRN: [id number] Date of Birth: 12/03/1955  Date of referral:  03/12/17               Reason for consult:  Facility Placement                Permission sought to share information with:  Family Supports Permission granted to share information::  No (Unable to reach patients DSS social worker/guardian Jordan Kennedy - message left regarding discharge. Talked with patient's group home supervisor Jordan Kennedy.)  Name::     [facility]   Agency::     Relationship::  DSS Guardianship SW  Contact Information:  [phone] (desk number) and [phone] (mobile)  Housing/Transportation Living arrangements for the past 2 months:  [place] ([place]) Source of Information:  Other (Comment Required) (Group home supervisor Jordan Kennedy) Patient Interpreter Needed:  None Criminal Activity/Legal Involvement Pertinent to Current Situation/Hospitalization:  No - Comment as needed Significant Relationships:  Other(Comment) (facility staff) Lives with:  Facility Resident (Group home) Do you feel safe going back to the place where you live?  Yes Need for family participation in patient care:  No (Coment)  Care giving concerns: None expressed.   Social Worker assessment / plan:  Per Jordan Kennedy, group home supervisor, patient has been at the group home several years and will return their at discharge.  Employment status:  Disabled (Comment on whether or not currently receiving Disability) Insurance information:  Medicare, Medicaid [place] PT Recommendations:  Not assessed at this time Information / Referral to community resources:  Other (Comment Required) (None needed or requested as patient discharging back to group home)  Patient/Family's Response to care:  No concerns expressed regarding care during hospitalization.  Patient/Family's Understanding of and Emotional Response to Diagnosis, Current Treatment,  and Prognosis: Not discussed.  Emotional Assessment Appearance:  Other (Comment Required (Did not talk with patient) Attitude/Demeanor/Rapport:  Unable to Assess Affect (typically observed):  Unable to Assess Orientation:    Alcohol / Substance use:  Tobacco Use, Alcohol Use, Illicit Drugs (Patient reported that he smokes and does not drink or use illicit drugs) Psych involvement (Current and /or in the community):  Yes (Comment) (Evaluated by psychiatry in hospital)  Discharge Needs  Concerns to be addressed:  Discharge Planning Concerns Readmission within the last 30 days:  No Current discharge risk:  None Barriers to Discharge:  No Barriers Identified   Sable Feil, LCSW 03/12/2017, 5:49 PM

## 2017-03-12 NOTE — Progress Notes (Signed)
Marshell Garfinkel (caregiver @ group home) called & updated on discharge.  Per [name], she will not be available to pick up pt until 9pm.  Discharge instructions including medication changes/regimen reviewed in detail with understanding verbalized.  Pt updated on disposition.  Will continue to monitor.

## 2017-03-12 NOTE — Clinical Social Work Note (Signed)
Patient medically stable for discharge back to [facility], transported by supervisor Jordan Kennedy with [facility]. Ms. Jordan Kennedy requested to be given the discharge paperwork when she picks up patient for transport.  CSW attempted to reach patient's social worker Jordan Kennedy at [phone] (desk), [phone] (mobile) and messages left regarding patient's discharge. Also called [phone] (alternate number for Ms. Jordan Kennedy) voice mail full. Attempted to reach Jordan Kennedy at [phone] and his VM has not been set-up.

## 2017-03-12 NOTE — Clinical Social Work Note (Signed)
Call made to Marshell Garfinkel [phone]) regarding patient's discharge. Ms. Wynetta Emery is Librarian, academic at [facility]. Caregiver services provided by [employer]. Ms. Wynetta Emery advised of patient's readiness for discharge today, if he tolerates his lunch and that she will be kept informed. Ms. Wynetta Emery indicated that she plans to take patient to his doctor after d/c from hospital for a follow-up visit.  Kamy Poinsett Givens, MSW, LCSW Licensed Clinical Social Worker [place] [phone]

## 2017-03-12 NOTE — Discharge Summary (Addendum)
Physician Discharge Summary  Jordan Kennedy ASN:[id number] DOB: 09-Dec-1955 DOA: 03/07/2017  PCP: [name], MD  Admit date: 03/07/2017 Discharge date: 03/12/2017  Time spent: 65 minutes  Recommendations for Outpatient Follow-up:  1. Follow-up with Pavelock, Ralene Bathe, MD in 1-2 weeks. On follow-up patient will need a basic metabolic profile done to follow-up on electrolytes and renal function. Patient also need a CBC done to follow-up on leukocytosis and hemoglobin. Patient's constipation need to be reassessed. 2. Follow-up with psychiatry in 2 weeks. Patient's Cogentin was discontinued as patient had presented with constipation and probable ileus versus small bowel obstruction. Patient's Clozaril dose was decreased to 200 mg twice daily and patient's Paxil was decreased to 30 mg daily due to patient's constipation. Adjustment in patient's medications were made by psychiatry. Outpatient follow-up.   Discharge Diagnoses:  Principal Problem:   Ileus ([place]) Active Problems:   Schizophrenia ([place])   Mental retardation   Constipation   Abdominal distension   Discharge Condition: Stable and improved  Diet recommendation: Regular  Filed Weights   03/10/17 1700  Weight: 87.1 kg (192 lb 0.3 oz)    History of present illness:  Per Dr. Graylon Gunning is a 61 y.o. male with medical history significant of Schizophrenia and MR, lives in a group home under supervision.  On varity of meds which have resulted in chronic constipation.  Presents to ED from UC over concern for bowel obstruction.  Last BM was 5/15.  Progressive abd distention over past 2 days.  Worsened significantly On the day of admission.  Nausea and multiple episodes of vomiting PTA.   ED Course: CT scan shows large stool burden, and obstruction with fluid filled small bowel and stomach (see images).    Hospital Course:  #1 ileus versus bowel obstruction in the setting of chronic constipation Patient was  admitted with abdominal distention and abdominal pain. Likely felt to be secondary to patient's schizophrenic medications of Cogentin and probably Clozaril and Paxil. CT findings with diffuse dilated small bowel without transition zone large amount of stool present. Patient had NG tube placed with 900 mL output however patient put out NG tube. Patient with no further nausea or vomiting. Patient initially improved clinically with significant large multiple bowel movements 03/08/2017. Patient's diet was advanced which he tolerated. Patient maintained on a bowel regimen of MiraLAX twice daily and Senokot S twice daily.  Patient 03/10/2017 with significant abdominal distention and hypoactive bowel sounds. Patient denied any nausea or emesis. Patient subsequently made nothing by mouth. Patient given a SMOG enema with multiple bowel movements. Abdominal distention improved. Patient was started on clear liquids and diet advanced to a soft diet which he tolerated. Patient will be discharged on a bowel regimen of MiraLAX and Senokot S. Patient's psychiatric medications were also adjusted as it was felt was the likely etiology of patient's chronic constipation/ileus. Patient's Cogentin was discontinued. Patient's Clozaril was decreased to 200 mg twice daily. Paxil was decreased to 30 mg daily. Outpatient follow-up.   #2 schizophrenia Patient during the hospitalization was noted to have some pressured speech as well as being anxious and fidgety. Psychiatry was consulted to assess the patient. Psychiatry adjusted patient's psychiatric medications as it was felt there were likely cause of patient's constipation/paralytic ileus. Patient's Cogentin was discontinued per psychiatry as it was felt the could cause chronic constipation and paralytic ileus. Patient's Clozaril was decreased to 200 mg twice daily and recommended that if no significant improvement in constipation may be decreased to  150 twice daily. Patient's Paxil  was also decreased to 30 mg daily. Patient remained in stable condition. Outpatient follow-up with psychiatry.   #3 mental retardation/intellectual disability  #4 BPH Patient was maintained on home regimen of Flomax.   #5 tobacco history Patient was placed on a nicotine patch. Tobacco cessation.   #6 leukocytosis Likely reactive leukocytosis. Likely stress-induced. Urinalysis was ordered however never done. Patient remained asymptomatic. Patient with no respiratory symptoms. Patient afebrile. Leukocytosis improved with improvement in patient's constipation/ileus. Outpatient follow-up.     Procedures:  CT abdomen and pelvis 03/07/2017  Abdominal films 03/07/2017   Consultations:  General surgery 03/08/2017  Psychiatry 03/10/2017 Dr. Darleene Cleaver  Discharge Exam: Vitals:   03/12/17 0504 03/12/17 1438  BP: (!) 153/94 132/90  Pulse: (!) 107 (!) 107  Resp: 18 18  Temp: 98.6 F (37 C) 98.2 F (36.8 C)    General: NAD Cardiovascular: RRR Respiratory: CTAB  Discharge Instructions   Discharge Instructions    Diet general    Complete by:  As directed    Increase activity slowly    Complete by:  As directed      Current Discharge Medication List    START taking these medications   Details  bisacodyl (DULCOLAX) 10 MG suppository Place 1 suppository (10 mg total) rectally daily as needed for moderate constipation or severe constipation. Qty: 12 suppository, Refills: 0    nicotine (NICODERM CQ - DOSED IN MG/24 HOURS) 21 mg/24hr patch Place 1 patch (21 mg total) onto the skin daily. Qty: 28 patch, Refills: 0    senna-docusate (SENOKOT-S) 8.6-50 MG tablet Take 1 tablet by mouth 2 (two) times daily.      CONTINUE these medications which have CHANGED   Details  cloZAPine (CLOZARIL) 100 MG tablet Take 2 tablets (200 mg total) by mouth 2 (two) times daily. Qty: 120 tablet, Refills: 0    PARoxetine (PAXIL) 30 MG tablet Take 1 tablet (30 mg total) by mouth every  evening. Qty: 30 tablet, Refills: 1    polyethylene glycol (MIRALAX / GLYCOLAX) packet Take 17 g by mouth daily. Qty: 14 each, Refills: 0      CONTINUE these medications which have NOT CHANGED   Details  aspirin 81 MG tablet Take 1 tablet (81 mg total) by mouth daily. Qty: 100 tablet, Refills: 3    linaclotide (LINZESS) 290 MCG CAPS capsule Take 290 mcg by mouth daily before breakfast.    mirabegron ER (MYRBETRIQ) 50 MG TB24 tablet Take 50 mg by mouth every evening.    omeprazole (PRILOSEC) 40 MG capsule Take 40 mg by mouth every evening.    tamsulosin (FLOMAX) 0.4 MG CAPS capsule Take 0.4 mg by mouth daily after supper.       STOP taking these medications     benztropine (COGENTIN) 1 MG tablet        No Known Allergies Follow-up Information    Pavelock, Ralene Bathe, MD. Schedule an appointment as soon as possible for a visit in 1 week(s).   Specialty:  Internal Medicine Why:  f/u in 1-2 weeks. Contact information: [address] [place] [address] [phone]        psychiatrist. Schedule an appointment as soon as possible for a visit in 2 week(s).            The results of significant diagnostics from this hospitalization (including imaging, microbiology, ancillary and laboratory) are listed below for reference.    Significant Diagnostic Studies: Dg Abdomen 1  View  Result Date: 03/07/2017 CLINICAL DATA:  Severe stomach pain sudden onset EXAM: ABDOMEN - 1 VIEW COMPARISON:  06/12/2012 FINDINGS: Visualized lung bases are grossly clear. Moderate diffuse gaseous dilatation of small and large bowel with gas in the rectum. No abnormal calcifications. IMPRESSION: Moderate diffuse gaseous dilatation of small and large bowel, could indicate ileus with partial obstruction also a consideration. CT correlation may be helpful for further evaluation. Electronically Signed   By: Donavan Foil M.D.   On: 03/07/2017 21:02   Ct Abdomen Pelvis W Contrast  Result  Date: 03/07/2017 CLINICAL DATA:  Abdominal pain and distension. EXAM: CT ABDOMEN AND PELVIS WITH CONTRAST TECHNIQUE: Multidetector CT imaging of the abdomen and pelvis was performed using the standard protocol following bolus administration of intravenous contrast. CONTRAST:  135mL ISOVUE-300 IOPAMIDOL (ISOVUE-300) INJECTION 61% COMPARISON:  Radiographs earlier this day.  CT 04/25/2012 FINDINGS: Lower chest: Mild dependent atelectasis at the lung bases. No pleural fluid. No consolidation. Hepatobiliary: Subcentimeter hypodensity in the right lobe is unchanged from prior exam. Small subcentimeter hypodensity adjacent to the middle hepatic vein is grossly similar, partially obscured by motion. Small subcapsular 11 mm lesion in the right lobe is unchanged. No new hepatic lesion. Gallbladder physiologically distended, no calcified stone. No biliary dilatation. Pancreas: No peripancreatic inflammation. Prominence of the proximal pancreatic duct measuring 3.3 mm. Mild parenchymal atrophy. Spleen: Normal in size without focal abnormality. Adrenals/Urinary Tract: No adrenal nodule. No hydronephrosis. Homogeneous renal enhancement. There is symmetric renal excretion on delayed phase imaging. Urinary bladder is nondistended and not well evaluated. Stomach/Bowel: Marked gastric distention with ingested contents. Bowel evaluation is technically difficult given the diffuse GI tract distension. Small bowel is diffusely dilated and fluid-filled. No definite transition point. Large colonic stool burden throughout the colon which appears tortuous and difficult to define. Sigmoid colon and rectum with only minimal stool. Appendix is not visualized due to diffuse bowel dilatation. There is no evidence of bowel wall thickening. No pneumatosis. No evidence of mesenteric twisting. Vascular/Lymphatic: No significant vascular findings are present. No enlarged abdominal or pelvic lymph nodes, evaluation for adenopathy is limited due to  bowel distention and paucity of intra-abdominal fat. Reproductive: Prostate is unremarkable. Other: No evidence of ascites allowing for limitations. No free air. Musculoskeletal: There are no acute or suspicious osseous abnormalities. IMPRESSION: Diffuse bowel distention throughout the abdomen involving stool-filled colon, fluid-filled small bowel, and ingested contents in the stomach. No discrete transition point is seen to suggest obstruction, however small bowel obstruction in the setting of chronic constipation is difficult to exclude. No evidence of bowel inflammation or perforation. Technically difficult bowel evaluation due to degree of distension and paucity of intra-abdominal fat. Electronically Signed   By: Jeb Levering M.D.   On: 03/07/2017 22:21   Dg Abd 2 Views  Result Date: 03/11/2017 CLINICAL DATA:  61 year old male with midline abdominal pain for 2 days with nausea vomiting and diarrhea. EXAM: ABDOMEN - 2 VIEW COMPARISON:  03/10/2017 and earlier, including CT Abdomen and Pelvis 03/07/2017, and also CT Abdomen and Pelvis 04/25/2012. FINDINGS: Upright and supine views. Multiple gas and fluid distended small bowel loops persist throughout the abdomen with multiple air-fluid levels on the upright view. Small bowel loops up to 53 mm diameter are evident, stable from the recent CT, and also similar to the 2013 CT Abdomen and Pelvis. No free air. Grossly negative lung bases. No acute osseous abnormality identified. IMPRESSION: 1. Unchanged bowel gas pattern since the CT on 03/07/2017, and also similar to a  CT Abdomen and Pelvis on 04/25/2012. Suspect chronic decreased bowel inertia. 2. No free air. Electronically Signed   By: Genevie Ann M.D.   On: 03/11/2017 08:33   Dg Abd 2 Views  Result Date: 03/10/2017 CLINICAL DATA:  Abdominal distension EXAM: ABDOMEN - 2 VIEW COMPARISON:  03/09/2017 FINDINGS: Scattered large and small bowel gas is noted. Fecal material is noted throughout the colon. The  overall appearance is stable. Distension of the stomach is noted with air. No free air is seen. IMPRESSION: Changes suggestive of a diffuse ileus.  No free air is noted. Electronically Signed   By: Inez Catalina M.D.   On: 03/10/2017 18:05   Dg Abd 2 Views  Result Date: 03/09/2017 CLINICAL DATA:  Followup small bowel obstruction. EXAM: ABDOMEN - 2 VIEW COMPARISON:  03/07/2017 FINDINGS: There is no bowel dilation. The amount of bowel gas as well as gastric distention has decreased from the prior exams. There is no current evidence of a bowel obstruction. There is no free air. The soft tissues are unremarkable. IMPRESSION: 1. No convincing bowel obstruction. No free air. Improved appearance when compared to previous day's studies. Electronically Signed   By: Lajean Manes M.D.   On: 03/09/2017 11:19   Dg Abd Portable 1v  Result Date: 03/07/2017 CLINICAL DATA:  Encounter for gastric tube placement EXAM: PORTABLE ABDOMEN - 1 VIEW COMPARISON:  CT from 03/07/2017 FINDINGS: The diaphragms are excluded on the current exam. Despite this, it appears that the tip and side port of a gastric tube are noted in the left upper quadrant in the expected location of the stomach utilizing the ribs for landmarks with coronal images from the same day CT. Contrast is noted within the genitourinary system. A significant amount of gaseous distention and stool is noted throughout large bowel with food residue in the expected location of the stomach. IMPRESSION: The tip and side port of a gastric tube are noted in the left upper quadrant in the expected location of the stomach. Electronically Signed   By: Ashley Royalty M.D.   On: 03/07/2017 23:31    Microbiology: Recent Results (from the past 240 hour(s))  Urine culture     Status: Abnormal   Collection Time: 03/09/17  4:06 AM  Result Value Ref Range Status   Specimen Description URINE, RANDOM  Final   Special Requests NONE  Final   Culture MULTIPLE SPECIES PRESENT, SUGGEST  RECOLLECTION (A)  Final   Report Status 03/10/2017 FINAL  Final     Labs: Basic Metabolic Panel:  Recent Labs Lab 03/08/17 0542 03/09/17 0647 03/10/17 0439 03/11/17 0548 03/12/17 0646  NA 142 140 143 142 141  K 3.7 3.5 3.6 4.3 3.6  CL 111 109 107 111 109  CO2 24 25 29  19* 25  GLUCOSE 105* 101* 105* 147* 159*  BUN 12 8 8 12 7   CREATININE 0.89 0.84 0.89 0.87 0.89  CALCIUM 9.0 8.5* 8.7* 9.5 8.8*  MG  --  2.0  --  2.1 1.8   Liver Function Tests:  Recent Labs Lab 03/07/17 2031  AST 26  ALT 31  ALKPHOS 83  BILITOT 0.5  PROT 7.3  ALBUMIN 4.2    Recent Labs Lab 03/07/17 2031  LIPASE 78*   No results for input(s): AMMONIA in the last 168 hours. CBC:  Recent Labs Lab 03/08/17 0542 03/09/17 0647 03/10/17 0439 03/11/17 0548 03/12/17 0646  WBC 17.7* 11.7* 12.0* 19.1* 12.6*  NEUTROABS  --  8.0* 7.7 15.9* 9.4*  HGB 12.5* 12.3* 11.9* 12.7* 11.6*  HCT 38.8* 38.4* 37.5* 39.7 35.6*  MCV 96.0 94.6 95.9 97.5 95.7  PLT 295 282 288 289 286   Cardiac Enzymes: No results for input(s): CKTOTAL, CKMB, CKMBINDEX, TROPONINI in the last 168 hours. BNP: BNP (last 3 results) No results for input(s): BNP in the last 8760 hours.  ProBNP (last 3 results) No results for input(s): PROBNP in the last 8760 hours.  CBG: No results for input(s): GLUCAP in the last 168 hours.     SignedIrine Seal MD.  [facility] 03/12/2017, 6:02 PM

## 2017-03-14 ENCOUNTER — Encounter (HOSPITAL_COMMUNITY): Payer: Self-pay

## 2017-03-14 ENCOUNTER — Encounter (HOSPITAL_COMMUNITY): Payer: Self-pay | Admitting: Emergency Medicine

## 2017-03-14 ENCOUNTER — Ambulatory Visit (HOSPITAL_COMMUNITY)
Admission: EM | Admit: 2017-03-14 | Discharge: 2017-03-14 | Disposition: A | Discharge status: Home / Self Care | Payer: Medicare Other | Attending: Internal Medicine | Admitting: Internal Medicine

## 2017-03-14 DIAGNOSIS — F79 Unspecified intellectual disabilities: Secondary | ICD-10-CM | POA: Insufficient documentation

## 2017-03-14 DIAGNOSIS — K5900 Constipation, unspecified: Secondary | ICD-10-CM

## 2017-03-14 DIAGNOSIS — Z7982 Long term (current) use of aspirin: Secondary | ICD-10-CM | POA: Diagnosis not present

## 2017-03-14 DIAGNOSIS — R1084 Generalized abdominal pain: Secondary | ICD-10-CM | POA: Diagnosis present

## 2017-03-14 DIAGNOSIS — K5909 Other constipation: Secondary | ICD-10-CM | POA: Diagnosis not present

## 2017-03-14 DIAGNOSIS — F17210 Nicotine dependence, cigarettes, uncomplicated: Secondary | ICD-10-CM | POA: Diagnosis not present

## 2017-03-14 DIAGNOSIS — K567 Ileus, unspecified: Secondary | ICD-10-CM | POA: Diagnosis not present

## 2017-03-14 LAB — COMPREHENSIVE METABOLIC PANEL
ALBUMIN: 4.2 g/dL (ref 3.5–5.0)
ALT: 72 U/L — ABNORMAL HIGH (ref 17–63)
AST: 43 U/L — AB (ref 15–41)
Alkaline Phosphatase: 97 U/L (ref 38–126)
Anion gap: 10 (ref 5–15)
BILIRUBIN TOTAL: 0.5 mg/dL (ref 0.3–1.2)
BUN: 20 mg/dL (ref 6–20)
CHLORIDE: 105 mmol/L (ref 101–111)
CO2: 27 mmol/L (ref 22–32)
Calcium: 9.5 mg/dL (ref 8.9–10.3)
Creatinine, Ser: 1 mg/dL (ref 0.61–1.24)
GFR calc Af Amer: >60 mL/min (ref >60)
GFR calc non Af Amer: >60 mL/min (ref >60)
Glucose, Bld: 109 mg/dL — ABNORMAL HIGH (ref 65–99)
POTASSIUM: 3.6 mmol/L (ref 3.5–5.1)
SODIUM: 142 mmol/L (ref 135–145)
TOTAL PROTEIN: 7 g/dL (ref 6.5–8.1)

## 2017-03-14 LAB — LIPASE, BLOOD: Lipase: 38 U/L (ref 11–51)

## 2017-03-14 LAB — CBC
HEMATOCRIT: 41.1 % (ref 39.0–52.0)
HEMOGLOBIN: 13.1 g/dL (ref 13.0–17.0)
MCH: 30.8 pg (ref 26.0–34.0)
MCHC: 31.9 g/dL (ref 30.0–36.0)
MCV: 96.7 fL (ref 78.0–100.0)
Platelets: 365 10*3/uL (ref 150–400)
RBC: 4.25 MIL/uL (ref 4.22–5.81)
RDW: 16.5 % — AB (ref 11.5–15.5)
WBC: 13.2 10*3/uL — ABNORMAL HIGH (ref 4.0–10.5)

## 2017-03-14 NOTE — ED Triage Notes (Signed)
Pt sent from urgent care, reports abdominal pain and constipation. LBM today while in waiting room but before that he hadn't had one since Tuesday night when he was d/c from the hospital for constipation. Pt is able to pass gas and burp "and it is very foul and his abdomen is very hard." Pt has mental retardation, hx given by caregiver.

## 2017-03-14 NOTE — ED Notes (Signed)
No answer in waiting room 

## 2017-03-14 NOTE — ED Provider Notes (Signed)
[place]    CSN: [id number] Arrival date & time: 03/14/17  1825     History   Chief Complaint Chief Complaint  Patient presents with  . Abdominal Pain    HPI Jordan Kennedy is a 61 y.o. male. Patient has long history of intermittent ileus/SBO, frequent admissions to the hospital. Most recent admission occurred on 5/18. Final diagnosis was constipation, some med changes to decrease this.  Also discharged on a suppository, which is not an allowed med at the group home he lives at. Patient saw his PCP yesterday morning, the morning after discharge, was feeling okay. Today, has some abdominal distention, complaining of discomfort, burping a lot. Emesis 1. Caregiver is seeking a more permanent solution.    HPI  Past Medical History:  Diagnosis Date  . Anemia   . BPH (benign prostatic hyperplasia)   . Mental retardation   . Schizophrenia ([place])    Monarche every three months;     Patient Active Problem List   Diagnosis Date Noted  . Abdominal distension   . Ileus ([place]) 03/07/2017  . Anemia, iron deficiency 09/18/2015  . Constipation 04/25/2012  . Leukocytosis, unspecified 04/25/2012  . Anxiety and depression 04/25/2012  . 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 Schizophrenia ([place])   . Mental retardation     History reviewed. No pertinent surgical history.     Home Medications    Prior to Admission medications   Medication Sig Start Date End Date Taking? Authorizing Provider  aspirin 81 MG tablet Take 1 tablet (81 mg total) by mouth daily. 01/31/15   Wardell Honour, MD  bisacodyl (DULCOLAX) 10 MG suppository Place 1 suppository (10 mg total) rectally daily as needed for moderate constipation or severe constipation. 03/12/17   Eugenie Filler, MD  cloZAPine (CLOZARIL) 100 MG tablet Take 2 tablets (200 mg total) by mouth 2 (two) times daily. 03/12/17   Eugenie Filler, MD  linaclotide [facility]) 290 MCG CAPS capsule Take 290 mcg by mouth daily before breakfast.     [provider]  mirabegron ER (MYRBETRIQ) 50 MG TB24 tablet Take 50 mg by mouth every evening.    [provider]  nicotine (NICODERM CQ - DOSED IN MG/24 HOURS) 21 mg/24hr patch Place 1 patch (21 mg total) onto the skin daily. 03/13/17   Eugenie Filler, MD  omeprazole (PRILOSEC) 40 MG capsule Take 40 mg by mouth every evening.    [provider]  PARoxetine (PAXIL) 30 MG tablet Take 1 tablet (30 mg total) by mouth every evening. 03/12/17   Eugenie Filler, MD  polyethylene glycol [facility] / Floria Raveling) packet Take 17 g by mouth daily. 03/12/17   Eugenie Filler, MD  senna-docusate (SENOKOT-S) 8.6-50 MG tablet Take 1 tablet by mouth 2 (two) times daily. 03/12/17   Eugenie Filler, MD  tamsulosin (FLOMAX) 0.4 MG CAPS capsule Take 0.4 mg by mouth daily after supper.     [provider]    Family History Family History  Problem Relation Age of Onset  . Cancer Mother        unknown type    Social History Social History  Substance Use Topics  . Smoking status: Current Every Day Smoker    Packs/day: 0.25    Years: 30.00    Types: Cigarettes  . Smokeless tobacco: Never Used  . Alcohol use No     Allergies   Patient has no known allergies.   Review of Systems Review of Systems  All  other systems reviewed and are negative.    Physical Exam Triage Vital Signs ED Triage Vitals [03/14/17 1851]  Enc Vitals Group     BP 133/84     Pulse Rate (!) 111     Resp (!) 22     Temp 98.3 F (36.8 C)     Temp Source Oral     SpO2 100 %     Weight      Height      Head Circumference      Peak Flow      Pain Score      Pain Loc    Updated Vital Signs BP 133/84   Pulse (!) 111   Temp 98.3 F (36.8 C) (Oral)   Resp (!) 22   SpO2 100%   Physical Exam  Constitutional: He is oriented to person, place, and time. No distress.  Alert, nicely groomed Patient is sitting upright, calm, on the exam table  HENT:  Head: Atraumatic.  Eyes:    Conjugate gaze, no eye redness/drainage  Neck: Neck supple.  Cardiovascular: Normal rate and regular rhythm.   Pulmonary/Chest: No respiratory distress. He has no wheezes. He has no rales.  Lungs clear, slightly diminished posteriorly, symmetric breath sounds  Abdominal: There is no tenderness. There is no rebound and no guarding.  Abdomen is moderately distended, no focal tenderness. Some high-pitched bowel sounds  Musculoskeletal: Normal range of motion.  Neurological: He is alert and oriented to person, place, and time.  Skin: Skin is warm and dry.  No cyanosis  Nursing note and vitals reviewed.    UC Treatments / Results   Procedures Procedures (including critical care time) None today  Final Clinical Impressions(s) / UC Diagnoses   Final diagnoses:  Obstipation  Generalized abdominal pain   Discussed possible options, including resumption of MiraLAX. The care giver is adamant that MiraLAX does not work.  Suppository administration is not allowed at the group home. Caregiver wants a permanent solution, which she believes will involve a Psychologist, sport and exercise. She will take the patient now for second opinion in the emergency room.     Sherlene Shams, MD 03/14/17 [phone]

## 2017-03-14 NOTE — ED Triage Notes (Signed)
Patient is vomiting in treatment room.  Abdominal pain started tonight.  Patient has grown in size since this morning.  No bowel movement.  Last bm was during recent hospitalization, discharged home Tuesday 5/22

## 2017-03-14 NOTE — ED Notes (Signed)
Notified of patient and nursing concerns

## 2017-03-15 ENCOUNTER — Emergency Department (HOSPITAL_COMMUNITY)
Admission: EM | Admit: 2017-03-15 | Discharge: 2017-03-15 | Disposition: A | Discharge status: Home / Self Care | Payer: Medicare Other | Attending: Emergency Medicine | Admitting: Emergency Medicine

## 2017-03-15 ENCOUNTER — Emergency Department (HOSPITAL_COMMUNITY): Payer: Medicare Other

## 2017-03-15 DIAGNOSIS — K567 Ileus, unspecified: Secondary | ICD-10-CM | POA: Diagnosis not present

## 2017-03-15 DIAGNOSIS — K5909 Other constipation: Secondary | ICD-10-CM

## 2017-03-15 LAB — URINALYSIS, ROUTINE W REFLEX MICROSCOPIC
BACTERIA UA: NONE SEEN
Bilirubin Urine: NEGATIVE
Glucose, UA: NEGATIVE mg/dL
Ketones, ur: 5 mg/dL — AB
Leukocytes, UA: NEGATIVE
Nitrite: NEGATIVE
PROTEIN: 30 mg/dL — AB
SPECIFIC GRAVITY, URINE: 1.031 — AB (ref 1.005–1.030)
pH: 5 (ref 5.0–8.0)

## 2017-03-15 MED ORDER — SORBITOL 70 % SOLN
960.0000 mL | TOPICAL_OIL | Freq: Once | ORAL | Status: AC
  Administered 2017-03-15: 960 mL via RECTAL
  Filled 2017-03-15: qty 240

## 2017-03-15 MED ORDER — ONDANSETRON 4 MG PO TBDP: 4.0000 mg | ORAL_TABLET | Freq: Four times a day (QID) | ORAL | 0 refills | Status: DC | PRN

## 2017-03-15 MED ORDER — ONDANSETRON 4 MG PO TBDP
4.0000 mg | ORAL_TABLET | Freq: Once | ORAL | Status: AC
  Administered 2017-03-15: 4 mg via ORAL
  Filled 2017-03-15: qty 1

## 2017-03-15 MED ORDER — ACETAMINOPHEN 500 MG PO TABS
1000.0000 mg | ORAL_TABLET | Freq: Once | ORAL | Status: AC
  Administered 2017-03-15: 1000 mg via ORAL
  Filled 2017-03-15: qty 2

## 2017-03-15 MED ORDER — PSYLLIUM 28 % PO PACK: 1.0000 | PACK | Freq: Two times a day (BID) | ORAL | 2 refills | Status: DC

## 2017-03-15 NOTE — Discharge Instructions (Signed)
Please continue your MiraLAX and stool softeners. Also continue Linzess.  Please make an appointment to see your primary care provider at the beginning of next week. Please call GI for close follow-up as well. Please increase your water and fiber intake at home. He may use Zofran as needed for nausea, vomiting. I recommend you use Metamucil twice a day as well.

## 2017-03-15 NOTE — ED Notes (Signed)
Pt has successful bowel movement. EDP aware and at bedside

## 2017-03-15 NOTE — ED Provider Notes (Signed)
TIME SEEN: 1:31 AM  By signing my name below, I, Ny'kea Lewis, attest that this documentation has been prepared under the direction and in the presence of Normajean Nash, Delice Bison, DO. Electronically Signed: Lise Auer, ED Scribe. 03/15/17. 2:09 AM.  CHIEF COMPLAINT: Generalized abdominal pain   HPI:  Jordan Kennedy is a 61 y.o. male with a PMHx of mental retardation, schizophrenia, ileus and small bowel obstruction who presents to the Emergency Department complaining of gradually worsening generalized abdominal pain and distention. Associated symptoms include nausea, vomiting, and foul flatulence and belches. No history of abdominal surgery.  No fever.  No alleviating factors.   Per pt's caregiver at group home, he has been unable to have a bowel movement since being discharged from the hospital on Tuesday. He has been complaint with constipation medications with no relief.   They report that he is on stool softeners, MiraLAX. They are unable to give suppositories or enemas at the group home.   It appears per recent hospital records patient was admitted for possible ileus versus constipation. Improved with SMOG enema.  Multiple of his psychiatric medications were discontinued or changed.  ROS: LEVEL V CAVEAT: HPI and ROS limited due to mental retardation.   PAST MEDICAL HISTORY/PAST SURGICAL HISTORY:  Past Medical History:  Diagnosis Date  . Anemia   . BPH (benign prostatic hyperplasia)   . Mental retardation   . Schizophrenia ([place])    Monarche every three months;     MEDICATIONS:  Prior to Admission medications   Medication Sig Start Date End Date Taking? Authorizing Provider  aspirin 81 MG tablet Take 1 tablet (81 mg total) by mouth daily. 01/31/15   Wardell Honour, MD  bisacodyl (DULCOLAX) 10 MG suppository Place 1 suppository (10 mg total) rectally daily as needed for moderate constipation or severe constipation. 03/12/17   Eugenie Filler, MD  cloZAPine (CLOZARIL) 100 MG tablet Take  2 tablets (200 mg total) by mouth 2 (two) times daily. 03/12/17   Eugenie Filler, MD  linaclotide [facility]) 290 MCG CAPS capsule Take 290 mcg by mouth daily before breakfast.    [provider]  mirabegron ER (MYRBETRIQ) 50 MG TB24 tablet Take 50 mg by mouth every evening.    [provider]  nicotine (NICODERM CQ - DOSED IN MG/24 HOURS) 21 mg/24hr patch Place 1 patch (21 mg total) onto the skin daily. 03/13/17   Eugenie Filler, MD  omeprazole (PRILOSEC) 40 MG capsule Take 40 mg by mouth every evening.    [provider]  PARoxetine (PAXIL) 30 MG tablet Take 1 tablet (30 mg total) by mouth every evening. 03/12/17   Eugenie Filler, MD  polyethylene glycol [facility] / Floria Raveling) packet Take 17 g by mouth daily. 03/12/17   Eugenie Filler, MD  senna-docusate (SENOKOT-S) 8.6-50 MG tablet Take 1 tablet by mouth 2 (two) times daily. 03/12/17   Eugenie Filler, MD  tamsulosin (FLOMAX) 0.4 MG CAPS capsule Take 0.4 mg by mouth daily after supper.     [provider]    ALLERGIES:  No Known Allergies  SOCIAL HISTORY:  Social History  Substance Use Topics  . Smoking status: Current Every Day Smoker    Packs/day: 0.25    Years: 30.00    Types: Cigarettes  . Smokeless tobacco: Never Used  . Alcohol use No    FAMILY HISTORY: Family History  Problem Relation Age of Onset  . Cancer Mother        unknown  type    EXAM: BP (!) 123/98 (BP Location: Left Arm)   Pulse (!) 112   Temp 97.4 F (36.3 C) (Oral)   Resp 18   SpO2 98%  CONSTITUTIONAL: Alert and oriented and responds appropriately to questions. Chronically ill-appearing, no significant distress, afebrileEAD: Normocephalic EYES: Conjunctivae clear, pupils appear equal, EOMI ENT: normal nose; moist mucous membranes NECK: Supple, no meningismus, no nuchal rigidity, no LAD  CARD: RRR; S1 and S2 appreciated; no murmurs, no clicks, no rubs, no gallops RESP: Normal chest excursion without  splinting or tachypnea; breath sounds clear and equal bilaterally; no wheezes, no rhonchi, no rales, no hypoxia or respiratory distress, speaking full sentences ABD/GI: Hypoactive bowel sounds; Distended with tympany  and no fluid wave; mildly tender throughout, no peritoneal signs, no hepatosplenomegaly, no guarding or rebound  BACK:  The back appears normal and is non-tender to palpation, there is no CVA tenderness EXT: Normal ROM in all joints; non-tender to palpation; no edema; normal capillary refill; no cyanosis, no calf tenderness or swelling    SKIN: Normal color for age and race; warm; no rash NEURO: Moves all extremities equally PSYCH: The patient's mood and manner are appropriate. Grooming and personal hygiene are appropriate.  MEDICAL DECISION MAKING:  Patient here with distended abdomen. Differential diagnosis includes ileus, constipation, bowel obstruction. Labs show leukocytosis which appears to be chronic for patient. Urine shows no significant sign of infection or dehydration. We'll treat symptomatically with Tylenol, Zofran. We'll obtain x-rays of the abdomen.  ED PROGRESS: Patient's x-ray shows ileus consistent with his history of dysmotility and less likely obstruction. He has done well with SMOG enema in the past which we will give him another one here in the emergency department to try to prevent admission.  3:55 AM  Pt given enema and had an extremely large bowel movement and now distention, tympany have resolved. Reports feeling much better. I feel he is safe to go back to his group home. It appears he is are you on stool softener and MiraLAX as well as Linzess.  I suspect this is secondary to intestinal dysmotility and ileus rather than a bowel obstruction especially given symptoms improved with enema and he had hypoactive bowel sounds on exam. Have recommended GI follow-up. They will also follow up with her primary care physician at the beginning of next week. We'll discharge  with prescriptions for Metamucil, Zofran to take as well. Caregiver at bedside comfortable with this plan. Discussed return precautions.  At this time, I do not feel there is any life-threatening condition present. I have reviewed and discussed all results (EKG, imaging, lab, urine as appropriate) and exam findings with patient/family. I have reviewed nursing notes and appropriate previous records.  I feel the patient is safe to be discharged home without further emergent workup and can continue workup as an outpatient as needed. Discussed usual and customary return precautions. Patient/family verbalize understanding and are comfortable with this plan.  Outpatient follow-up has been provided if needed. All questions have been answered.    I personally performed the services described in this documentation, which was scribed in my presence. The recorded information has been reviewed and is accurate.     Lorenso Quirino, Delice Bison, DO 03/15/17 [phone]

## 2018-06-29 ENCOUNTER — Encounter (HOSPITAL_BASED_OUTPATIENT_CLINIC_OR_DEPARTMENT_OTHER): Payer: Self-pay | Admitting: *Deleted

## 2018-06-29 ENCOUNTER — Other Ambulatory Visit: Payer: Self-pay

## 2018-06-29 ENCOUNTER — Inpatient Hospital Stay (HOSPITAL_BASED_OUTPATIENT_CLINIC_OR_DEPARTMENT_OTHER)
Admission: EM | Admit: 2018-06-29 | Discharge: 2018-07-09 | DRG: 872 | Disposition: A | Discharge status: Skilled Nursing Facility | Payer: Medicare Other | Attending: Internal Medicine | Admitting: Internal Medicine

## 2018-06-29 ENCOUNTER — Emergency Department (HOSPITAL_BASED_OUTPATIENT_CLINIC_OR_DEPARTMENT_OTHER): Payer: Medicare Other

## 2018-06-29 DIAGNOSIS — R6510 Systemic inflammatory response syndrome (SIRS) of non-infectious origin without acute organ dysfunction: Secondary | ICD-10-CM | POA: Diagnosis present

## 2018-06-29 DIAGNOSIS — Z0189 Encounter for other specified special examinations: Secondary | ICD-10-CM

## 2018-06-29 DIAGNOSIS — K599 Functional intestinal disorder, unspecified: Secondary | ICD-10-CM | POA: Diagnosis present

## 2018-06-29 DIAGNOSIS — Z79899 Other long term (current) drug therapy: Secondary | ICD-10-CM

## 2018-06-29 DIAGNOSIS — A419 Sepsis, unspecified organism: Secondary | ICD-10-CM | POA: Diagnosis present

## 2018-06-29 DIAGNOSIS — N179 Acute kidney failure, unspecified: Secondary | ICD-10-CM | POA: Diagnosis not present

## 2018-06-29 DIAGNOSIS — K5900 Constipation, unspecified: Secondary | ICD-10-CM | POA: Diagnosis present

## 2018-06-29 DIAGNOSIS — E876 Hypokalemia: Secondary | ICD-10-CM | POA: Diagnosis not present

## 2018-06-29 DIAGNOSIS — Z781 Physical restraint status: Secondary | ICD-10-CM

## 2018-06-29 DIAGNOSIS — F79 Unspecified intellectual disabilities: Secondary | ICD-10-CM

## 2018-06-29 DIAGNOSIS — N400 Enlarged prostate without lower urinary tract symptoms: Secondary | ICD-10-CM | POA: Diagnosis present

## 2018-06-29 DIAGNOSIS — F17210 Nicotine dependence, cigarettes, uncomplicated: Secondary | ICD-10-CM | POA: Diagnosis present

## 2018-06-29 DIAGNOSIS — F209 Schizophrenia, unspecified: Secondary | ICD-10-CM | POA: Diagnosis present

## 2018-06-29 DIAGNOSIS — K56609 Unspecified intestinal obstruction, unspecified as to partial versus complete obstruction: Secondary | ICD-10-CM

## 2018-06-29 DIAGNOSIS — F200 Paranoid schizophrenia: Secondary | ICD-10-CM | POA: Diagnosis not present

## 2018-06-29 DIAGNOSIS — K5901 Slow transit constipation: Secondary | ICD-10-CM | POA: Diagnosis present

## 2018-06-29 DIAGNOSIS — K5909 Other constipation: Secondary | ICD-10-CM | POA: Diagnosis present

## 2018-06-29 DIAGNOSIS — Z7982 Long term (current) use of aspirin: Secondary | ICD-10-CM

## 2018-06-29 DIAGNOSIS — R030 Elevated blood-pressure reading, without diagnosis of hypertension: Secondary | ICD-10-CM | POA: Diagnosis not present

## 2018-06-29 DIAGNOSIS — R300 Dysuria: Secondary | ICD-10-CM | POA: Diagnosis present

## 2018-06-29 DIAGNOSIS — R410 Disorientation, unspecified: Secondary | ICD-10-CM | POA: Diagnosis not present

## 2018-06-29 DIAGNOSIS — Z4659 Encounter for fitting and adjustment of other gastrointestinal appliance and device: Secondary | ICD-10-CM | POA: Diagnosis not present

## 2018-06-29 DIAGNOSIS — F72 Severe intellectual disabilities: Secondary | ICD-10-CM | POA: Diagnosis present

## 2018-06-29 HISTORY — DX: Personal history of other diseases of the digestive system: Z87.19

## 2018-06-29 HISTORY — DX: Systemic inflammatory response syndrome (sirs) of non-infectious origin without acute organ dysfunction: R65.10

## 2018-06-29 HISTORY — DX: Other constipation: K59.09

## 2018-06-29 HISTORY — DX: Unspecified intestinal obstruction, unspecified as to partial versus complete obstruction: K56.609

## 2018-06-29 HISTORY — DX: Partial intestinal obstruction, unspecified as to cause: K56.600

## 2018-06-29 HISTORY — DX: Acute kidney failure, unspecified: N17.9

## 2018-06-29 LAB — COMPREHENSIVE METABOLIC PANEL
ALBUMIN: 4.6 g/dL (ref 3.5–5.0)
ALT: 25 U/L (ref 0–44)
AST: 27 U/L (ref 15–41)
Alkaline Phosphatase: 114 U/L (ref 38–126)
Anion gap: 14 (ref 5–15)
BUN: 33 mg/dL — AB (ref 6–20)
CHLORIDE: 101 mmol/L (ref 98–111)
CO2: 25 mmol/L (ref 22–32)
Calcium: 10 mg/dL (ref 8.9–10.3)
Creatinine, Ser: 2.25 mg/dL — ABNORMAL HIGH (ref 0.61–1.24)
GFR calc Af Amer: 35 mL/min — ABNORMAL LOW (ref >60)
GFR calc non Af Amer: 30 mL/min — ABNORMAL LOW (ref >60)
GLUCOSE: 150 mg/dL — AB (ref 70–99)
POTASSIUM: 4.3 mmol/L (ref 3.5–5.1)
SODIUM: 140 mmol/L (ref 135–145)
Total Bilirubin: 0.8 mg/dL (ref 0.3–1.2)
Total Protein: 8.8 g/dL — ABNORMAL HIGH (ref 6.5–8.1)

## 2018-06-29 LAB — URINALYSIS, ROUTINE W REFLEX MICROSCOPIC
Bacteria, UA: NONE SEEN
Bilirubin Urine: NEGATIVE
GLUCOSE, UA: NEGATIVE mg/dL
KETONES UR: 5 mg/dL — AB
Leukocytes, UA: NEGATIVE
NITRITE: NEGATIVE
PROTEIN: 30 mg/dL — AB
Specific Gravity, Urine: 1.03 (ref 1.005–1.030)
pH: 5 (ref 5.0–8.0)

## 2018-06-29 LAB — CBC WITH DIFFERENTIAL/PLATELET
Basophils Absolute: 0 10*3/uL (ref 0.0–0.1)
Basophils Relative: 0 %
EOS PCT: 0 %
Eosinophils Absolute: 0 10*3/uL (ref 0.0–0.7)
HEMATOCRIT: 46.9 % (ref 39.0–52.0)
Hemoglobin: 15.7 g/dL (ref 13.0–17.0)
LYMPHS ABS: 1.6 10*3/uL (ref 0.7–4.0)
LYMPHS PCT: 9 %
MCH: 31.2 pg (ref 26.0–34.0)
MCHC: 33.5 g/dL (ref 30.0–36.0)
MCV: 93.2 fL (ref 78.0–100.0)
MONO ABS: 2 10*3/uL — AB (ref 0.1–1.0)
MONOS PCT: 11 %
Neutro Abs: 14.6 10*3/uL — ABNORMAL HIGH (ref 1.7–7.7)
Neutrophils Relative %: 80 %
PLATELETS: 364 10*3/uL (ref 150–400)
RBC: 5.03 MIL/uL (ref 4.22–5.81)
RDW: 16.6 % — AB (ref 11.5–15.5)
WBC: 18.3 10*3/uL — ABNORMAL HIGH (ref 4.0–10.5)

## 2018-06-29 LAB — I-STAT CG4 LACTIC ACID, ED
LACTIC ACID, VENOUS: 2.27 mmol/L — AB (ref 0.5–1.9)
LACTIC ACID, VENOUS: 0.99 mmol/L (ref 0.5–1.9)

## 2018-06-29 LAB — LIPASE, BLOOD: LIPASE: 27 U/L (ref 11–51)

## 2018-06-29 MED ORDER — SODIUM CHLORIDE 0.9 % IV BOLUS (SEPSIS)
1000.0000 mL | Freq: Once | INTRAVENOUS | Status: AC
  Administered 2018-06-29: 1000 mL via INTRAVENOUS

## 2018-06-29 MED ORDER — ONDANSETRON HCL 4 MG PO TABS: 4.0000 mg | ORAL_TABLET | Freq: Four times a day (QID) | ORAL | Status: DC | PRN

## 2018-06-29 MED ORDER — ONDANSETRON HCL 4 MG/2ML IJ SOLN
4.0000 mg | Freq: Four times a day (QID) | INTRAMUSCULAR | Status: DC | PRN
  Administered 2018-06-30: 4 mg via INTRAVENOUS
  Filled 2018-06-29: qty 2

## 2018-06-29 MED ORDER — FENTANYL CITRATE (PF) 100 MCG/2ML IJ SOLN: 50.0000 ug | INTRAMUSCULAR | Status: DC | PRN

## 2018-06-29 MED ORDER — SODIUM CHLORIDE 0.9 % IV SOLN
2.0000 g | Freq: Every day | INTRAVENOUS | Status: DC
  Administered 2018-06-30 – 2018-07-07 (×9): 2 g via INTRAVENOUS
  Filled 2018-06-29 (×2): qty 2
  Filled 2018-06-29: qty 20
  Filled 2018-06-29 (×6): qty 2

## 2018-06-29 MED ORDER — FENTANYL CITRATE (PF) 100 MCG/2ML IJ SOLN
50.0000 ug | INTRAMUSCULAR | Status: DC | PRN
  Administered 2018-06-29: 50 ug via INTRAVENOUS
  Filled 2018-06-29: qty 2

## 2018-06-29 MED ORDER — ENOXAPARIN SODIUM 40 MG/0.4ML ~~LOC~~ SOLN
40.0000 mg | SUBCUTANEOUS | Status: DC
  Administered 2018-06-30 – 2018-07-08 (×9): 40 mg via SUBCUTANEOUS
  Filled 2018-06-29 (×10): qty 0.4

## 2018-06-29 MED ORDER — VANCOMYCIN HCL 500 MG IV SOLR
INTRAVENOUS | Status: AC
  Filled 2018-06-29: qty 500

## 2018-06-29 MED ORDER — SODIUM CHLORIDE 0.9 % IV SOLN
2.0000 g | Freq: Once | INTRAVENOUS | Status: AC
  Administered 2018-06-29: 2 g via INTRAVENOUS

## 2018-06-29 MED ORDER — VANCOMYCIN HCL IN DEXTROSE 1-5 GM/200ML-% IV SOLN: 1000.0000 mg | Freq: Once | INTRAVENOUS | Status: DC

## 2018-06-29 MED ORDER — ONDANSETRON HCL 4 MG/2ML IJ SOLN
4.0000 mg | Freq: Once | INTRAMUSCULAR | Status: AC
  Administered 2018-06-29: 4 mg via INTRAVENOUS
  Filled 2018-06-29: qty 2

## 2018-06-29 MED ORDER — FENTANYL CITRATE (PF) 100 MCG/2ML IJ SOLN
50.0000 ug | INTRAMUSCULAR | Status: DC | PRN
  Administered 2018-06-30 (×6): 50 ug via INTRAVENOUS
  Filled 2018-06-29 (×6): qty 2

## 2018-06-29 MED ORDER — SODIUM CHLORIDE 0.9 % IV SOLN
INTRAVENOUS | Status: AC
  Administered 2018-06-29: 22:00:00 via INTRAVENOUS

## 2018-06-29 MED ORDER — CEFEPIME HCL 2 G IJ SOLR
INTRAMUSCULAR | Status: AC
  Filled 2018-06-29: qty 2

## 2018-06-29 MED ORDER — ACETAMINOPHEN 650 MG RE SUPP: 650.0000 mg | Freq: Four times a day (QID) | RECTAL | Status: DC | PRN

## 2018-06-29 MED ORDER — METRONIDAZOLE IN NACL 5-0.79 MG/ML-% IV SOLN
500.0000 mg | Freq: Three times a day (TID) | INTRAVENOUS | Status: DC
  Administered 2018-06-29 – 2018-07-08 (×26): 500 mg via INTRAVENOUS
  Filled 2018-06-29 (×27): qty 100

## 2018-06-29 MED ORDER — ACETAMINOPHEN 325 MG PO TABS: 650.0000 mg | ORAL_TABLET | Freq: Four times a day (QID) | ORAL | Status: DC | PRN

## 2018-06-29 MED ORDER — VANCOMYCIN HCL 1000 MG IV SOLR
INTRAVENOUS | Status: AC
  Filled 2018-06-29: qty 1000

## 2018-06-29 MED ORDER — VANCOMYCIN HCL 10 G IV SOLR
1500.0000 mg | Freq: Once | INTRAVENOUS | Status: AC
  Administered 2018-06-29: 1500 mg via INTRAVENOUS
  Filled 2018-06-29: qty 1500

## 2018-06-29 NOTE — ED Provider Notes (Signed)
[facility] EMERGENCY DEPARTMENT Provider Note   CSN: [id number] Arrival date & time: 06/29/18  1608     History   Chief Complaint Chief Complaint  Patient presents with  . Abdominal Pain    HPI Jordan Kennedy is a 62 y.o. male with a past medical history of intellectual disability, schizophrenia who presents today for evaluation of abdominal pain and vomiting.  He reports that he has not had a bowel movement for the past few days and that his abdomen is been distended since he woke up this morning.  He reports it hurts when he pees.  Remainder of history obtained from group home staff and chart as patient has a history of intellectual disability.   HPI  Past Medical History:  Diagnosis Date  . Anemia   . Bowel obstruction ([place])   . BPH (benign prostatic hyperplasia)   . Chronic constipation   . History of ileus   . Mental retardation   . Partial bowel obstruction ([place])   . Schizophrenia ([place])    Monarche every three months;     Patient Active Problem List   Diagnosis Date Noted  . Abdominal distension   . Ileus ([place]) 03/07/2017  . Anemia, iron deficiency 09/18/2015  . Constipation 04/25/2012  . Leukocytosis, unspecified 04/25/2012  . Anxiety and depression 04/25/2012  . 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 Schizophrenia ([place])   . Mental retardation     History reviewed. No pertinent surgical history.      Home Medications    Prior to Admission medications   Medication Sig Start Date End Date Taking? Authorizing Provider  amantadine (SYMMETREL) 100 MG capsule Take 100 mg by mouth daily.   Yes [provider]  aspirin 81 MG tablet Take 1 tablet (81 mg total) by mouth daily. 01/31/15  Yes Wardell Honour, MD  cloZAPine (CLOZARIL) 100 MG tablet Take 2 tablets (200 mg total) by mouth 2 (two) times daily. 03/12/17  Yes Eugenie Filler, MD  linaclotide [facility]) 290 MCG CAPS capsule Take 290 mcg by mouth daily before breakfast.   Yes [provider]  Melatonin 10 MG TABS Take by mouth at bedtime.   Yes [provider]  metoCLOPramide (REGLAN) 5 MG tablet Take 5 mg by mouth 2 (two) times daily.   Yes [provider]  mirabegron ER (MYRBETRIQ) 50 MG TB24 tablet Take 50 mg by mouth every evening.   Yes [provider]  omeprazole (PRILOSEC) 40 MG capsule Take 40 mg by mouth every evening.   Yes [provider]  PARoxetine (PAXIL) 40 MG tablet Take 40 mg by mouth every morning.   Yes [provider]  polyethylene glycol (MIRALAX / GLYCOLAX) packet Take 17 g by mouth daily. 03/12/17  Yes Eugenie Filler, MD  senna-docusate (SENOKOT-S) 8.6-50 MG tablet Take 1 tablet by mouth 2 (two) times daily. 03/12/17  Yes Eugenie Filler, MD  tamsulosin (FLOMAX) 0.4 MG CAPS capsule Take 0.4 mg by mouth daily after supper.    Yes [provider]  terbinafine (LAMISIL) 250 MG tablet Take 250 mg by mouth daily.   Yes [provider]  zolpidem (AMBIEN) 10 MG tablet Take 10 mg by mouth at bedtime as needed for sleep.   Yes [provider]  bisacodyl (DULCOLAX) 10 MG suppository Place 1 suppository (10 mg total) rectally daily as needed for moderate constipation or severe constipation. 03/12/17   Eugenie Filler, MD  nicotine (NICODERM CQ - DOSED IN MG/24  HOURS) 21 mg/24hr patch Place 1 patch (21 mg total) onto the skin daily. 03/13/17   Eugenie Filler, MD  ondansetron (ZOFRAN ODT) 4 MG disintegrating tablet Take 1 tablet (4 mg total) by mouth every 6 (six) hours as needed for nausea or vomiting. 03/15/17   Ward, Delice Bison, DO  PARoxetine (PAXIL) 30 MG tablet Take 1 tablet (30 mg total) by mouth every evening. 03/12/17   Eugenie Filler, MD  psyllium (METAMUCIL SMOOTH TEXTURE) 28 % packet Take 1 packet by mouth 2 (two) times daily. 03/15/17   Ward, Delice Bison, DO    Family History Family History  Problem Relation Age of Onset  . Cancer Mother        unknown type     Social  History Social History   Tobacco Use  . Smoking status: Current Every Day Smoker    Packs/day: 0.25    Years: 30.00    Pack years: 7.50    Types: Cigarettes  . Smokeless tobacco: Never Used  Substance Use Topics  . Alcohol use: No  . Drug use: No     Allergies   Patient has no known allergies.   Review of Systems Review of Systems  Constitutional: Positive for chills, fatigue and fever.  Respiratory: Negative for cough and shortness of breath.   Gastrointestinal: Positive for abdominal distention, abdominal pain, constipation, nausea and vomiting. Negative for rectal pain.  Genitourinary: Positive for dysuria.     Physical Exam Updated Vital Signs BP (!) 159/107 (BP Location: Left Arm)   Pulse (!) 123   Temp (!) 100.8 F (38.2 C) (Oral)   Resp 20   Ht 6\' 2"  (1.88 m)   Wt 90.7 kg   SpO2 93%   BMI 25.68 kg/m   Physical Exam  Constitutional: He appears well-developed and well-nourished. No distress.  HENT:  Head: Normocephalic and atraumatic.  Mouth/Throat: Oropharynx is clear and moist.  Eyes: Conjunctivae are normal.  Neck: Neck supple.  Cardiovascular: Regular rhythm. Tachycardia present.  No murmur heard. Pulmonary/Chest: Effort normal and breath sounds normal. No stridor. No respiratory distress. He has no rales.  Abdominal: Bowel sounds are normal. He exhibits distension. There is generalized tenderness.  Musculoskeletal: He exhibits no edema.  Neurological: He is alert.  Skin: Skin is warm and dry.  Psychiatric: He has a normal mood and affect.  Nursing note and vitals reviewed.    ED Treatments / Results  Labs (all labs ordered are listed, but only abnormal results are displayed) Labs Reviewed  COMPREHENSIVE METABOLIC PANEL - Abnormal; Notable for the following components:      Result Value   Glucose, Bld 150 (*)    BUN 33 (*)    Creatinine, Ser 2.25 (*)    Total Protein 8.8 (*)    GFR calc non Af Amer 30 (*)    GFR calc Af Amer 35 (*)     All other components within normal limits  CBC WITH DIFFERENTIAL/PLATELET - Abnormal; Notable for the following components:   WBC 18.3 (*)    RDW 16.6 (*)    Neutro Abs 14.6 (*)    Monocytes Absolute 2.0 (*)    All other components within normal limits  I-STAT CG4 LACTIC ACID, ED - Abnormal; Notable for the following components:   Lactic Acid, Venous 2.27 (*)    All other components within normal limits  CULTURE, BLOOD (ROUTINE X 2)  CULTURE, BLOOD (ROUTINE X 2)  URINE CULTURE  LIPASE, BLOOD  URINALYSIS,  ROUTINE W REFLEX MICROSCOPIC  I-STAT CG4 LACTIC ACID, ED    EKG EKG Interpretation  Date/Time:  Sunday June 29 2018 16:34:55 EDT Ventricular Rate:  125 PR Interval:    QRS Duration: 89 QT Interval:  302 QTC Calculation: 436 R Axis:   -18 Text Interpretation:  Sinus tachycardia Multiple premature complexes, vent & supraven Borderline left axis deviation RSR' in V1 or V2, probably normal variant Since last tracing rate faster Confirmed by Wandra Arthurs [phone]) on 06/29/2018 4:37:39 PM   Radiology Ct Abdomen Pelvis Wo Contrast  Result Date: 06/29/2018 CLINICAL DATA:  Possible SBO, hx SBO, no bowel movement since Thursday, abdominal pain and distention EXAM: CT ABDOMEN AND PELVIS WITHOUT CONTRAST TECHNIQUE: Multidetector CT imaging of the abdomen and pelvis was performed following the standard protocol without IV contrast. COMPARISON:  03/07/2017 and acute abdomen series from 5/20 02/2017 FINDINGS: Lower chest: Mild subsegmental atelectasis in both lower lobes. Fluid distention of the esophagus. Hepatobiliary: Unremarkable Pancreas: Borderline prominence of dorsal pancreatic duct in the pancreatic head. Otherwise unremarkable. Spleen: Unremarkable Adrenals/Urinary Tract: No stones are identified. No hydronephrosis. Normal renal contours. The density of urine in the urinary bladder is slightly higher than expected at 37 Hounsfield units. Stomach/Bowel: Prominent gastric distention and  distention of nearly all of the small bowel. Transition point what appears to be distal ileum about 8 cm from the ileocecal valve in the vicinity of image 18/6 where there appears to be a transition from dilated to nondilated terminal ileum. The bowel loops are indistinct in this vicinity. There is some mild prominence of stool in the distal colon. Small bowel loops are dilated up to about 6.3 cm. There is some low-level edema tracking along small bowel mesentery. Vascular/Lymphatic: There is no significant atherosclerotic calcification in the abdominal aorta. Reproductive: Moderate prostatomegaly. 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 IMPRESSION: 1. The bowel obstruction in the right upper quadrant appears to be in the vicinity of the terminal ileum, about 8 cm from the ileocecal valve. A specific cause for the obstruction is not well seen; it is possible that there is an adhesion or that the high position of the cecum is allowing for a twisting in the bowel. I am skeptical of a trans mesenteric internal hernia as the cause based on the overall appearance. Small bowel loops are dilated up to 6.3 cm and there is also distention of the stomach and fluid distention of the esophagus. 2. High density of urine in the urinary bladder, correlate with urine analysis in assessing for hematuria or proteinuria. 3. Moderate prostatomegaly. Electronically Signed   By: Van Clines M.D.   On: 06/29/2018 18:33   Dg Chest 2 View  Result Date: 06/29/2018 CLINICAL DATA:  Abdominal pain and distention. No bowel movement since Thursday EXAM: CHEST - 2 VIEW COMPARISON:  03/15/2017 radiograph FINDINGS: Low lung volumes are present, causing crowding of the pulmonary vasculature. Mild atelectasis of both lung bases. The lungs appear otherwise clear. Heart size within normal limits given the low lung volumes and AP  projection. No pleural effusion. IMPRESSION: 1. Low lung volumes with mild bibasilar atelectasis. Electronically Signed   By: Van Clines M.D.   On: 06/29/2018 18:21    Procedures Procedures (including critical care time)  Medications Ordered in ED Medications  metroNIDAZOLE (FLAGYL) IVPB 500 mg (0 mg Intravenous Stopped 06/29/18 1800)  vancomycin (VANCOCIN) 1,500 mg in sodium chloride 0.9 % 500  mL IVPB (1,500 mg Intravenous New Bag/Given 06/29/18 1816)  vancomycin (VANCOCIN) 1000 MG powder (has no administration in time range)  ceFEPIme (MAXIPIME) 2 g injection (has no administration in time range)  vancomycin (VANCOCIN) 500 MG powder (has no administration in time range)  fentaNYL (SUBLIMAZE) injection 50 mcg (50 mcg Intravenous Given 06/29/18 1809)  sodium chloride 0.9 % bolus 1,000 mL (0 mLs Intravenous Stopped 06/29/18 1713)    And  sodium chloride 0.9 % bolus 1,000 mL (1,000 mLs Intravenous New Bag/Given 06/29/18 1703)    And  sodium chloride 0.9 % bolus 1,000 mL (1,000 mLs Intravenous New Bag/Given 06/29/18 1836)  ceFEPIme (MAXIPIME) 2 g in sodium chloride 0.9 % 100 mL IVPB (0 g Intravenous Stopped 06/29/18 1752)  ondansetron (ZOFRAN) injection 4 mg (4 mg Intravenous Given 06/29/18 1657)     Initial Impression / Assessment and Plan / ED Course  I have reviewed the triage vital signs and the nursing notes.  Pertinent labs & imaging results that were available during my care of the patient were reviewed by me and considered in my medical decision making (see chart for details).  Clinical Course as of Jun 29 1933  Nancy Fetter Jun 29, 2018  1850 Spoke with Dr. Kieth Brightly.  Will admit patient to medicine.    [EH]  [place] with hosptialist at [facility] who will admit the patient.    [EH]    Clinical Course User Index [EH] Lorin Glass, PA-C    Patient presents today for evaluation of generalized abdominal pain and not having bowel movements for the past few days.  On initial exam  patient meets sirs criteria, he is tachycardic and febrile.  Given abdominal pain code sepsis was called.  His abdomen was generally distended with diffuse tenderness.  White count is elevated at 18.3.  He has evidence of AKI with his creatinine elevated to 2.25.  His lipase is not elevated, blood cultures were obtained, initial lactic acid was mildly elevated however repeat after fluids was significantly improved.  CT abdomen showed bowel obstruction in the terminal ileum, with distention of small intestine proximal to this along with significant distention of the esophagus and stomach.  NG tube was placed with 1800 mL's of output.  I consulted general surgery who recommended medical admission.  I spoke with the hospitalist at [facility] who agreed to admit patient.  Patient was kept n.p.o.  Patient transferred to [facility] by [employer].  This patient was seen as a shared visit with Dr. Darl Householder.  Final Clinical Impressions(s) / ED Diagnoses   Final diagnoses:  Encounter for nasogastric (NG) tube placement  SBO (small bowel obstruction) ([place])  AKI (acute kidney injury) ([place])  Sepsis, due to unspecified organism [facility])    ED Discharge Orders    None       Ollen Gross 06/30/18 0116    Drenda Freeze, MD 07/02/18 [phone]

## 2018-06-29 NOTE — H&P (Signed)
History and Physical    Jordan Kennedy IRS:[id number] DOB: [birth year] DOA: 06/29/2018  PCP: [name], MD  Patient coming from: Group home  I have personally briefly reviewed patient's old medical records in [place]  Chief Complaint: Abd pain  HPI: Jordan Kennedy is a 62 y.o. male with medical history significant of intellectual disability, schizophrenia, SBO, chronic constipation.  Patient presents to ED at [facility] with c/o abd pain and vomiting.  No BM for past several days.  Abdomen distended since he woke up this AM.  Associated N/V.  Also has dysuria.   ED Course: CT shows SBO with transition point at distal ileum apparently.  Mild prominence of stool in distal colon.  NGT placed.  Gen surg consulted and hospitalist asked to admit.   Review of Systems: As per HPI otherwise 10 point review of systems negative.   Past Medical History:  Diagnosis Date  . Anemia   . Bowel obstruction ([place])   . BPH (benign prostatic hyperplasia)   . Chronic constipation   . History of ileus   . Mental retardation   . Partial bowel obstruction ([place])   . Schizophrenia ([place])    Monarche every three months;     History reviewed. No pertinent surgical history.   reports that he has been smoking cigarettes. He has a 7.50 pack-year smoking history. He has never used smokeless tobacco. He reports that he does not drink alcohol or use drugs.  No Known Allergies  Family History  Problem Relation Age of Onset  . Cancer Mother        unknown type     Prior to Admission medications   Medication Sig Start Date End Date Taking? Authorizing Provider  amantadine (SYMMETREL) 100 MG capsule Take 100 mg by mouth daily.   Yes [provider]  aspirin 81 MG tablet Take 1 tablet (81 mg total) by mouth daily. 01/31/15  Yes Wardell Honour, MD  cloZAPine (CLOZARIL) 100 MG tablet Take 2 tablets (200 mg total) by mouth 2 (two) times daily. 03/12/17  Yes Eugenie Filler, MD    linaclotide [facility]) 290 MCG CAPS capsule Take 290 mcg by mouth daily before breakfast.   Yes [provider]  Melatonin 10 MG TABS Take by mouth at bedtime.   Yes [provider]  metoCLOPramide (REGLAN) 5 MG tablet Take 5 mg by mouth 2 (two) times daily.   Yes [provider]  mirabegron ER (MYRBETRIQ) 50 MG TB24 tablet Take 50 mg by mouth every evening.   Yes [provider]  omeprazole (PRILOSEC) 40 MG capsule Take 40 mg by mouth every evening.   Yes [provider]  PARoxetine (PAXIL) 40 MG tablet Take 40 mg by mouth every morning.   Yes [provider]  polyethylene glycol (MIRALAX / GLYCOLAX) packet Take 17 g by mouth daily. 03/12/17  Yes Eugenie Filler, MD  senna-docusate (SENOKOT-S) 8.6-50 MG tablet Take 1 tablet by mouth 2 (two) times daily. 03/12/17  Yes Eugenie Filler, MD  tamsulosin (FLOMAX) 0.4 MG CAPS capsule Take 0.4 mg by mouth daily after supper.    Yes [provider]  terbinafine (LAMISIL) 250 MG tablet Take 250 mg by mouth daily.   Yes [provider]  zolpidem (AMBIEN) 10 MG tablet Take 10 mg by mouth at bedtime as needed for sleep.   Yes [provider]    Physical Exam: Vitals:   06/29/18 1915 06/29/18 1945 06/29/18 2000 06/29/18 2107  BP: (!) 133/96 (!) 134/97 (!) 141/96 (!) 140/99  Pulse: (!) 115 (!) 108 (!) 107 (!) 104  Resp: (!) 30 (!) 29 (!) 29 (!) 25  Temp:    98.5 F (36.9 C)  TempSrc:    Oral  SpO2: 94% 95% 95% 95%  Weight:      Height:        Constitutional: NAD, calm, comfortable Eyes: PERRL, lids and conjunctivae normal ENMT: Mucous membranes are moist. Posterior pharynx clear of any exudate or lesions.Normal dentition.  Neck: normal, supple, no masses, no thyromegaly Respiratory: clear to auscultation bilaterally, no wheezing, no crackles. Normal respiratory effort. No accessory muscle use.  Cardiovascular: Regular rate and rhythm, no murmurs / rubs / gallops.  No extremity edema. 2+ pedal pulses. No carotid bruits.  Abdomen: no tenderness, no masses palpated. No hepatosplenomegaly. Bowel sounds positive.  Musculoskeletal: no clubbing / cyanosis. No joint deformity upper and lower extremities. Good ROM, no contractures. Normal muscle tone.  Skin: no rashes, lesions, ulcers. No induration Neurologic: CN 2-12 grossly intact. Sensation intact, DTR normal. Strength 5/5 in all 4.  Psychiatric: Normal judgment and insight. Alert and oriented x 3. Normal mood.    Labs on Admission: I have personally reviewed following labs and imaging studies  CBC: Recent Labs  Lab 06/29/18 1623  WBC 18.3*  NEUTROABS 14.6*  HGB 15.7  HCT 46.9  MCV 93.2  PLT 810   Basic Metabolic Panel: Recent Labs  Lab 06/29/18 1623  NA 140  K 4.3  CL 101  CO2 25  GLUCOSE 150*  BUN 33*  CREATININE 2.25*  CALCIUM 10.0   GFR: Estimated Creatinine Clearance: 41.1 mL/min (A) (by C-G formula based on SCr of 2.25 mg/dL (H)). Liver Function Tests: Recent Labs  Lab 06/29/18 1623  AST 27  ALT 25  ALKPHOS 114  BILITOT 0.8  PROT 8.8*  ALBUMIN 4.6   Recent Labs  Lab 06/29/18 1623  LIPASE 27   No results for input(s): AMMONIA in the last 168 hours. Coagulation Profile: No results for input(s): INR, PROTIME in the last 168 hours. Cardiac Enzymes: No results for input(s): CKTOTAL, CKMB, CKMBINDEX, TROPONINI in the last 168 hours. BNP (last 3 results) No results for input(s): PROBNP in the last 8760 hours. HbA1C: No results for input(s): HGBA1C in the last 72 hours. CBG: No results for input(s): GLUCAP in the last 168 hours. Lipid Profile: No results for input(s): CHOL, HDL, LDLCALC, TRIG, CHOLHDL, LDLDIRECT in the last 72 hours. Thyroid Function Tests: No results for input(s): TSH, T4TOTAL, FREET4, T3FREE, THYROIDAB in the last 72 hours. Anemia Panel: No results for input(s): VITAMINB12, FOLATE, FERRITIN, TIBC, IRON, RETICCTPCT in the last 72 hours. Urine  analysis:    Component Value Date/Time   COLORURINE YELLOW 03/15/2017 0148   APPEARANCEUR HAZY (A) 03/15/2017 0148   LABSPEC 1.031 (H) 03/15/2017 0148   PHURINE 5.0 03/15/2017 0148   GLUCOSEU NEGATIVE 03/15/2017 0148   HGBUR SMALL (A) 03/15/2017 0148   BILIRUBINUR NEGATIVE 03/15/2017 0148   BILIRUBINUR Negative 01/31/2015 1010   KETONESUR 5 (A) 03/15/2017 0148   PROTEINUR 30 (A) 03/15/2017 0148   UROBILINOGEN 0.2 01/31/2015 1010   UROBILINOGEN 1.0 06/12/2012 1315   NITRITE NEGATIVE 03/15/2017 0148   LEUKOCYTESUR NEGATIVE 03/15/2017 0148    Radiological Exams on Admission: Ct Abdomen Pelvis Wo Contrast  Result Date: 06/29/2018 CLINICAL DATA:  Possible SBO, hx SBO, no bowel movement since Thursday, abdominal pain and distention EXAM: CT ABDOMEN AND PELVIS WITHOUT CONTRAST TECHNIQUE:  Multidetector CT imaging of the abdomen and pelvis was performed following the standard protocol without IV contrast. COMPARISON:  03/07/2017 and acute abdomen series from 5/20 02/2017 FINDINGS: Lower chest: Mild subsegmental atelectasis in both lower lobes. Fluid distention of the esophagus. Hepatobiliary: Unremarkable Pancreas: Borderline prominence of dorsal pancreatic duct in the pancreatic head. Otherwise unremarkable. Spleen: Unremarkable Adrenals/Urinary Tract: No stones are identified. No hydronephrosis. Normal renal contours. The density of urine in the urinary bladder is slightly higher than expected at 37 Hounsfield units. Stomach/Bowel: Prominent gastric distention and distention of nearly all of the small bowel. Transition point what appears to be distal ileum about 8 cm from the ileocecal valve in the vicinity of image 18/6 where there appears to be a transition from dilated to nondilated terminal ileum. The bowel loops are indistinct in this vicinity. There is some mild prominence of stool in the distal colon. Small bowel loops are dilated up to about 6.3 cm. There is some low-level edema tracking along  small bowel mesentery. Vascular/Lymphatic: There is no significant atherosclerotic calcification in the abdominal aorta. Reproductive: Moderate prostatomegaly. 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 IMPRESSION: 1. The bowel obstruction in the right upper quadrant appears to be in the vicinity of the terminal ileum, about 8 cm from the ileocecal valve. A specific cause for the obstruction is not well seen; it is possible that there is an adhesion or that the high position of the cecum is allowing for a twisting in the bowel. I am skeptical of a trans mesenteric internal hernia as the cause based on the overall appearance. Small bowel loops are dilated up to 6.3 cm and there is also distention of the stomach and fluid distention of the esophagus. 2. High density of urine in the urinary bladder, correlate with urine analysis in assessing for hematuria or proteinuria. 3. Moderate prostatomegaly. Electronically Signed   By: Van Clines M.D.   On: 06/29/2018 18:33   Dg Chest 2 View  Result Date: 06/29/2018 CLINICAL DATA:  Abdominal pain and distention. No bowel movement since Thursday EXAM: CHEST - 2 VIEW COMPARISON:  03/15/2017 radiograph FINDINGS: Low lung volumes are present, causing crowding of the pulmonary vasculature. Mild atelectasis of both lung bases. The lungs appear otherwise clear. Heart size within normal limits given the low lung volumes and AP projection. No pleural effusion. IMPRESSION: 1. Low lung volumes with mild bibasilar atelectasis. Electronically Signed   By: Van Clines M.D.   On: 06/29/2018 18:21   Dg Abd Portable 1v  Result Date: 06/29/2018 CLINICAL DATA:  Nasogastric tube placement, bowel obstruction EXAM: PORTABLE ABDOMEN - 1 VIEW COMPARISON:  Portable exam 1909 hours compared to CT abdomen and pelvis 06/29/2018 FINDINGS: Tip of nasogastric tube projects over the  lateral LEFT mid abdomen, likely mid stomach. Dilated small bowel loops persist. Prominent stool in LEFT colon. Food debris and air within stomach. Bibasilar pulmonary infiltrates. Osseous structures unremarkable. IMPRESSION: Nasogastric tube projects over mid stomach. Persistent dilatation of small bowel loops with note of prominent stool in LEFT colon. Electronically Signed   By: Lavonia Dana M.D.   On: 06/29/2018 19:57    EKG: Independently reviewed.  Assessment/Plan Principal Problem:   Small bowel obstruction (HCC) Active Problems:   Schizophrenia (HCC)   Intellectual disability   Constipation   AKI (acute kidney injury) ([place])   SIRS (systemic inflammatory response syndrome) ([place])    1. SBO - vs paralytic  ileus given history 1. NGT - 1800 out so far per report 2. IVF 3. NPO 4. Only mild stool in colon this time, dont think obstipation is primary issue here based on CT read this time around, so will hold off on enemas for the moment. 5. 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  Will hold clozaril (looks like cogentin not on list anymore. 6. Gen surg consulted by EDP. 2. AKI - 1. Suspect pre-renal 2. IVF 3. Repeat BMP in AM 4. Strict intake and output 3. SIRS - 1. EDP gave cefepime / vanc / flagyl for SIRS on presentation with unclear location. 2. Will de-escalate to just rocephin / flagyl for abd coverage for now. 3. UA pending, did report dysuria to EDP. 4. Schizophrenia - 1. Will have to hold psych meds due to NPO status and severe SBO.  DVT prophylaxis: Lovenox Code Status: Full Family Communication: No family in room Disposition Plan: back to group home after admit Consults called: Gen surg Admission status: Admit to inpatient - IP status for SBO requiring NGT   Etta Quill DO [facility] Pager [phone] Only works nights!  If 7AM-7PM, please contact the primary day team physician taking care of  patient  [email or web address] Password [email or web address]  06/29/2018, 10:00 PM

## 2018-06-29 NOTE — ED Notes (Signed)
Patient transported to X-ray 

## 2018-06-29 NOTE — ED Triage Notes (Signed)
Pt c/o abdominal pain today with vomiting. No BM. Pt is here with caregiver from group home

## 2018-06-30 ENCOUNTER — Inpatient Hospital Stay (HOSPITAL_COMMUNITY): Payer: Medicare Other

## 2018-06-30 LAB — CBC
HCT: 43.2 % (ref 39.0–52.0)
HEMOGLOBIN: 14 g/dL (ref 13.0–17.0)
MCH: 30.8 pg (ref 26.0–34.0)
MCHC: 32.4 g/dL (ref 30.0–36.0)
MCV: 94.9 fL (ref 78.0–100.0)
PLATELETS: 359 10*3/uL (ref 150–400)
RBC: 4.55 MIL/uL (ref 4.22–5.81)
RDW: 16.4 % — ABNORMAL HIGH (ref 11.5–15.5)
WBC: 16.5 10*3/uL — AB (ref 4.0–10.5)

## 2018-06-30 LAB — BASIC METABOLIC PANEL WITH GFR
Anion gap: 8 (ref 5–15)
BUN: 26 mg/dL — ABNORMAL HIGH (ref 6–20)
CO2: 28 mmol/L (ref 22–32)
Calcium: 9.1 mg/dL (ref 8.9–10.3)
Chloride: 109 mmol/L (ref 98–111)
Creatinine, Ser: 1.32 mg/dL — ABNORMAL HIGH (ref 0.61–1.24)
GFR calc Af Amer: >60 mL/min
GFR calc non Af Amer: 57 mL/min — ABNORMAL LOW
Glucose, Bld: 132 mg/dL — ABNORMAL HIGH (ref 70–99)
Potassium: 3.9 mmol/L (ref 3.5–5.1)
Sodium: 145 mmol/L (ref 135–145)

## 2018-06-30 LAB — HIV ANTIBODY (ROUTINE TESTING W REFLEX): HIV SCREEN 4TH GENERATION: NONREACTIVE

## 2018-06-30 MED ORDER — SODIUM CHLORIDE 0.9 % IV SOLN
INTRAVENOUS | Status: AC
  Administered 2018-06-30: via INTRAVENOUS

## 2018-06-30 MED ORDER — LABETALOL HCL 5 MG/ML IV SOLN
10.0000 mg | Freq: Four times a day (QID) | INTRAVENOUS | Status: DC | PRN
  Administered 2018-06-30 – 2018-07-08 (×9): 10 mg via INTRAVENOUS
  Filled 2018-06-30 (×15): qty 4

## 2018-06-30 NOTE — Progress Notes (Signed)
Notified MD Hall, C. Labetalol IV was given as ordered. Will check BP in 30 minutes.

## 2018-06-30 NOTE — Progress Notes (Signed)
PROGRESS NOTE  Jordan Kennedy UUV:[id number] DOB: February 09, 1956 DOA: 06/29/2018 PCP: [name], MD  HPI/Recap of past 24 hours:  Jordan Kennedy is a 62 y.o. male with medical history significant of intellectual disability, schizophrenia, SBO, chronic constipation.  Patient presents to ED at [facility] with c/o abd pain and vomiting.  No BM for past several days.  Abdomen distended since he woke up this AM.  Associated N/V.  Also has dysuria. ED Course: CT shows SBO with transition point at distal ileum apparently. Mild prominence of stool in distal colon.  NGT placed.  Gen surg consulted and hospitalist asked to admit.  06/30/2018: Patient seen and examined at his bedside.  He removed his NG tube last night and had to be replaced this morning.  He is alert but confused.  Asked about drinking soda multiple times.  Patient reminded he is n.p.o. we will put on soft restraint to avoid re-pulling of NG tube.    Assessment/Plan: Principal Problem:   Small bowel obstruction (HCC) Active Problems:   Schizophrenia (HCC)   Intellectual disability   Constipation   AKI (acute kidney injury) ([place])   SIRS (systemic inflammatory response syndrome) ([place])  1. SBO - vs paralytic ileus given history 1. Continue NG tube to suction 2. IVF-continue IV fluid hydration 3. NPO-continue n.p.o. status 4. Neurosurgery consulted by EDP 5. 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  Will hold clozaril (looks like cogentin not on list anymore.) 6. Gen surg consulted by EDP. 7. Pain management in place 2. AKI -improving 1. Suspect pre-renal 2. IVF 3. Repeat BMP in AM 4. Strict intake and output 3. SIRS - 1. EDP gave cefepime / vanc / flagyl for SIRS on presentation with unclear location. 2. Will de-escalate to just rocephin / flagyl for abd coverage for now. 3. UA negative. 4. Schizophrenia - 1. Will have to hold psych meds due to NPO status and severe SBO    Code Status: Full  code  Family Communication: None at bedside  Disposition Plan: Back to group home possibly in 2 to 3 days when SBO has resolved and surgery has signed off   Consultants:  General surgery consulted by EDP  Procedures:  None  Antimicrobials:  Rocephin  IV Flagyl  DVT prophylaxis: Subcu Lovenox   Objective: Vitals:   06/30/18 0429 06/30/18 0636 06/30/18 1200 06/30/18 1358  BP: (!) 156/101 (!) 157/106 140/90 (!) 180/162  Pulse: (!) 103 96 96 (!) 105  Resp: (!) 28  14 16   Temp: 98.3 F (36.8 C)  98.6 F (37 C)   TempSrc: Oral  Oral   SpO2: 96%  96% 95%  Weight: 88.7 kg     Height:        Intake/Output Summary (Last 24 hours) at 06/30/2018 1405 Last data filed at 06/30/2018 1008 Gross per 24 hour  Intake 3543.22 ml  Output 3400 ml  Net 143.22 ml   Filed Weights   06/29/18 1613 06/30/18 0429  Weight: 90.7 kg 88.7 kg    Exam:  . General: 62 y.o. year-old male well developed well nourished in no acute distress.  Alert but confused.  NG tube to suction.. . Cardiovascular: Regular rate and rhythm with no rubs or gallops.  No thyromegaly or JVD noted.   Marland Kitchen Respiratory: Clear to auscultation with no wheezes or rales. Good inspiratory effort. . Abdomen: Distended with hypoactive bowel sounds.  NG tube to suction. . Musculoskeletal: No lower extremity edema. 2/4 pulses in  all 4 extremities. Marland Kitchen Psychiatry: Mood is appropriate for condition and setting   Data Reviewed: CBC: Recent Labs  Lab 06/29/18 1623 06/30/18 0526  WBC 18.3* 16.5*  NEUTROABS 14.6*  --   HGB 15.7 14.0  HCT 46.9 43.2  MCV 93.2 94.9  PLT 364 161   Basic Metabolic Panel: Recent Labs  Lab 06/29/18 1623 06/30/18 0526  NA 140 145  K 4.3 3.9  CL 101 109  CO2 25 28  GLUCOSE 150* 132*  BUN 33* 26*  CREATININE 2.25* 1.32*  CALCIUM 10.0 9.1   GFR: Estimated Creatinine Clearance: 70.1 mL/min (A) (by C-G formula based on SCr of 1.32 mg/dL (H)). Liver Function Tests: Recent Labs  Lab  06/29/18 1623  AST 27  ALT 25  ALKPHOS 114  BILITOT 0.8  PROT 8.8*  ALBUMIN 4.6   Recent Labs  Lab 06/29/18 1623  LIPASE 27   No results for input(s): AMMONIA in the last 168 hours. Coagulation Profile: No results for input(s): INR, PROTIME in the last 168 hours. Cardiac Enzymes: No results for input(s): CKTOTAL, CKMB, CKMBINDEX, TROPONINI in the last 168 hours. BNP (last 3 results) No results for input(s): PROBNP in the last 8760 hours. HbA1C: No results for input(s): HGBA1C in the last 72 hours. CBG: No results for input(s): GLUCAP in the last 168 hours. Lipid Profile: No results for input(s): CHOL, HDL, LDLCALC, TRIG, CHOLHDL, LDLDIRECT in the last 72 hours. Thyroid Function Tests: No results for input(s): TSH, T4TOTAL, FREET4, T3FREE, THYROIDAB in the last 72 hours. Anemia Panel: No results for input(s): VITAMINB12, FOLATE, FERRITIN, TIBC, IRON, RETICCTPCT in the last 72 hours. Urine analysis:    Component Value Date/Time   COLORURINE YELLOW 06/29/2018 2216   APPEARANCEUR HAZY (A) 06/29/2018 2216   LABSPEC 1.030 06/29/2018 2216   PHURINE 5.0 06/29/2018 2216   GLUCOSEU NEGATIVE 06/29/2018 2216   HGBUR MODERATE (A) 06/29/2018 2216   BILIRUBINUR NEGATIVE 06/29/2018 2216   BILIRUBINUR Negative 01/31/2015 1010   KETONESUR 5 (A) 06/29/2018 2216   PROTEINUR 30 (A) 06/29/2018 2216   UROBILINOGEN 0.2 01/31/2015 1010   UROBILINOGEN 1.0 06/12/2012 1315   NITRITE NEGATIVE 06/29/2018 2216   LEUKOCYTESUR NEGATIVE 06/29/2018 2216   Sepsis Labs: @LABRCNTIP (procalcitonin:4,lacticidven:4)  ) Recent Results (from the past 240 hour(s))  Blood Culture (routine x 2)     Status: None (Preliminary result)   Collection Time: 06/29/18  4:23 PM  Result Value Ref Range Status   Specimen Description   Final    BLOOD RIGHT ARM Performed at [facility], [address] [place], [place] [address]    Special Requests   Final    BOTTLES DRAWN AEROBIC AND ANAEROBIC Blood  Culture results may not be optimal due to an excessive volume of blood received in culture bottles Performed at [facility], [address] [place], [place] [address]    Culture   Final    NO GROWTH < 12 HOURS Performed at [facility], [place] [address] [place], [place] [address]    Report Status PENDING  Incomplete  Blood Culture (routine x 2)     Status: None (Preliminary result)   Collection Time: 06/29/18  4:45 PM  Result Value Ref Range Status   Specimen Description   Final    BLOOD LEFT ARM Performed at [facility], [address] [place], [place] [address]    Special Requests   Final    BOTTLES DRAWN AEROBIC  AND ANAEROBIC Blood Culture results may not be optimal due to an excessive volume of blood received in culture bottles Performed at [facility], [address] [place], [place] [address]    Culture   Final    NO GROWTH < 12 HOURS Performed at [facility] [address] [place], [place] [address]    Report Status PENDING  Incomplete      Studies: Ct Abdomen Pelvis Wo Contrast  Result Date: 06/29/2018 CLINICAL DATA:  Possible SBO, hx SBO, no bowel movement since Thursday, abdominal pain and distention EXAM: CT ABDOMEN AND PELVIS WITHOUT CONTRAST TECHNIQUE: Multidetector CT imaging of the abdomen and pelvis was performed following the standard protocol without IV contrast. COMPARISON:  03/07/2017 and acute abdomen series from 5/20 02/2017 FINDINGS: Lower chest: Mild subsegmental atelectasis in both lower lobes. Fluid distention of the esophagus. Hepatobiliary: Unremarkable Pancreas: Borderline prominence of dorsal pancreatic duct in the pancreatic head. Otherwise unremarkable. Spleen: Unremarkable Adrenals/Urinary Tract: No stones are identified. No hydronephrosis. Normal renal contours. The density of urine in the urinary bladder is slightly higher than expected at 37 Hounsfield units. Stomach/Bowel: Prominent gastric  distention and distention of nearly all of the small bowel. Transition point what appears to be distal ileum about 8 cm from the ileocecal valve in the vicinity of image 18/6 where there appears to be a transition from dilated to nondilated terminal ileum. The bowel loops are indistinct in this vicinity. There is some mild prominence of stool in the distal colon. Small bowel loops are dilated up to about 6.3 cm. There is some low-level edema tracking along small bowel mesentery. Vascular/Lymphatic: There is no significant atherosclerotic calcification in the abdominal aorta. Reproductive: Moderate prostatomegaly. 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 IMPRESSION: 1. The bowel obstruction in the right upper quadrant appears to be in the vicinity of the terminal ileum, about 8 cm from the ileocecal valve. A specific cause for the obstruction is not well seen; it is possible that there is an adhesion or that the high position of the cecum is allowing for a twisting in the bowel. I am skeptical of a trans mesenteric internal hernia as the cause based on the overall appearance. Small bowel loops are dilated up to 6.3 cm and there is also distention of the stomach and fluid distention of the esophagus. 2. High density of urine in the urinary bladder, correlate with urine analysis in assessing for hematuria or proteinuria. 3. Moderate prostatomegaly. Electronically Signed   By: Van Clines M.D.   On: 06/29/2018 18:33   Dg Chest 2 View  Result Date: 06/29/2018 CLINICAL DATA:  Abdominal pain and distention. No bowel movement since Thursday EXAM: CHEST - 2 VIEW COMPARISON:  03/15/2017 radiograph FINDINGS: Low lung volumes are present, causing crowding of the pulmonary vasculature. Mild atelectasis of both lung bases. The lungs appear otherwise clear. Heart size within normal limits given the low lung  volumes and AP projection. No pleural effusion. IMPRESSION: 1. Low lung volumes with mild bibasilar atelectasis. Electronically Signed   By: Van Clines M.D.   On: 06/29/2018 18:21   Dg Abd 1 View  Result Date: 06/30/2018 CLINICAL DATA:  NG tube placement. EXAM: ABDOMEN - 1 VIEW COMPARISON:  Radiographs and CT yesterday. FINDINGS: Tip and side port of the enteric tube below the diaphragm in the stomach. Persistent small bowel dilatation primarily in the right abdomen again  seen. IMPRESSION: Tip and side port of the enteric tube below the diaphragm in the stomach. Persistent small bowel obstruction. Electronically Signed   By: Keith Rake M.D.   On: 06/30/2018 01:32   Dg Abd Portable 1v  Result Date: 06/29/2018 CLINICAL DATA:  Nasogastric tube placement, bowel obstruction EXAM: PORTABLE ABDOMEN - 1 VIEW COMPARISON:  Portable exam 1909 hours compared to CT abdomen and pelvis 06/29/2018 FINDINGS: Tip of nasogastric tube projects over the lateral LEFT mid abdomen, likely mid stomach. Dilated small bowel loops persist. Prominent stool in LEFT colon. Food debris and air within stomach. Bibasilar pulmonary infiltrates. Osseous structures unremarkable. IMPRESSION: Nasogastric tube projects over mid stomach. Persistent dilatation of small bowel loops with note of prominent stool in LEFT colon. Electronically Signed   By: Lavonia Dana M.D.   On: 06/29/2018 19:57    Scheduled Meds: . enoxaparin (LOVENOX) injection  40 mg Subcutaneous Q24H    Continuous Infusions: . sodium chloride 100 mL/hr at 06/30/18 0641  . cefTRIAXone (ROCEPHIN)  IV Stopped (06/30/18 0130)  . metronidazole Stopped (06/30/18 0910)     LOS: 1 day     Kayleen Memos, MD [facility] Pager [phone]  If 7PM-7AM, please contact night-coverage [email or web address] Password [email or web address] 06/30/2018, 2:05 PM

## 2018-06-30 NOTE — Care Management Note (Signed)
Case Management Note  Patient Details  Name: Jordan Kennedy MRN: [id number] Date of Birth: [birth year]  Subjective/Objective:                  62 y.o.malewith medical history significant ofintellectual disability, schizophrenia, SBO, chronic constipation. Patient presents to ED at [facility] with c/o abd pain and vomiting. No BM for past several days. Abdomen distended since he woke up this AM. Associated N/V. Also has dysuria. ED Course:CT shows SBO with transition point at distal ileum apparently. Mild prominence of stool in distal colon.  NGT placed. Gen surg consulted and hospitalist asked to admit.  06/30/2018: Patient seen and examined at his bedside.  He removed his NG tube last night and had to be replaced this morning.  He is alert but confused.  Asked about drinking soda multiple times.  Patient reminded he is n.p.o. we will put on soft restraint to avoid re-pulling of NG tube.    Assessment/Plan: Principal Problem:   Small bowel obstruction (HCC) Active Problems:   Schizophrenia (HCC)   Intellectual disability   Constipation   AKI (acute kidney injury) ([place])   SIRS (systemic inflammatory response syndrome) ([place])  1. SBO - vs paralytic ileus given history 1. Continue NG tube to suction 2. IVF-continue IV fluid hydration 3. NPO-continue n.p.o. status 4. Neurosurgery consulted by EDP 5. 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 Will hold clozaril (looks like cogentin not on list anymore.) 6. Gen surg consulted by EDP. 7. Pain management in place 2. AKI -improving 1. Suspect pre-renal 2. IVF 3. Repeat BMP in AM 4. Strict intake and output 3. SIRS - 1. EDP gave cefepime / vanc / flagyl for SIRS on presentation with unclear location. 2. Will de-escalate to just rocephin / flagyl for abd coverage for now. 3. UA negative. 4. Schizophrenia - 1. Will have to hold psych meds due to NPO status and severe SBO  Action/Plan: Following for  progression and cm needs  Expected Discharge Date:                  Expected Discharge Plan:     In-House Referral:     Discharge planning Services     Post Acute Care Choice:    Choice offered to:     DME Arranged:    DME Agency:     HH Arranged:    HH Agency:     Status of Service:     If discussed at [employer], dates discussed:    Additional Comments:  Leeroy Cha, RN 06/30/2018, 2:52 PM

## 2018-06-30 NOTE — Progress Notes (Signed)
Notified MD Nevada Crane, C.; monitor patient. Pain medication was given as ordered.

## 2018-06-30 NOTE — Progress Notes (Signed)
Writer went into pts room and NG tube has been removed. Pt states "I pulled it out, it was hurting". Educated pt and explained another NG tube will be  Placed, pt said ok and he will not remove. Hospitalist notified, will reinsert and call xray for placement verification.

## 2018-06-30 NOTE — Progress Notes (Signed)
Pt arrived to unit via stretcher room 1510. Alert and oriented x2. NG tube in place. VS taken and callbell within reach. Pt from group home, caregiver Ms Wynetta Emery called in to confirm pt is on unit. Pt continues to complain about NG tube and request food and drinks.Will educate pt and continue to monitor.

## 2018-06-30 NOTE — Progress Notes (Addendum)
BP=180/162.  Notified MD Nevada Crane, C. Monitor patient closely. Labetalol IV and Pain medication was given as ordered. Check HR apical by RN. Will check BP in 30 minutes.

## 2018-07-01 ENCOUNTER — Inpatient Hospital Stay (HOSPITAL_COMMUNITY): Payer: Medicare Other

## 2018-07-01 DIAGNOSIS — F200 Paranoid schizophrenia: Secondary | ICD-10-CM

## 2018-07-01 DIAGNOSIS — Z0189 Encounter for other specified special examinations: Secondary | ICD-10-CM

## 2018-07-01 LAB — CBC
HEMATOCRIT: 38.7 % — AB (ref 39.0–52.0)
HEMOGLOBIN: 12.3 g/dL — AB (ref 13.0–17.0)
MCH: 31.1 pg (ref 26.0–34.0)
MCHC: 31.8 g/dL (ref 30.0–36.0)
MCV: 97.7 fL (ref 78.0–100.0)
Platelets: 296 10*3/uL (ref 150–400)
RBC: 3.96 MIL/uL — AB (ref 4.22–5.81)
RDW: 16.5 % — ABNORMAL HIGH (ref 11.5–15.5)
WBC: 13.2 10*3/uL — ABNORMAL HIGH (ref 4.0–10.5)

## 2018-07-01 LAB — COMPREHENSIVE METABOLIC PANEL
ALBUMIN: 3.2 g/dL — AB (ref 3.5–5.0)
ALK PHOS: 71 U/L (ref 38–126)
ALT: 17 U/L (ref 0–44)
ANION GAP: 8 (ref 5–15)
AST: 19 U/L (ref 15–41)
BILIRUBIN TOTAL: 0.6 mg/dL (ref 0.3–1.2)
BUN: 19 mg/dL (ref 6–20)
CALCIUM: 8.2 mg/dL — AB (ref 8.9–10.3)
CO2: 25 mmol/L (ref 22–32)
Chloride: 112 mmol/L — ABNORMAL HIGH (ref 98–111)
Creatinine, Ser: 1.06 mg/dL (ref 0.61–1.24)
GFR calc non Af Amer: >60 mL/min (ref >60)
GLUCOSE: 121 mg/dL — AB (ref 70–99)
Potassium: 3.8 mmol/L (ref 3.5–5.1)
Sodium: 145 mmol/L (ref 135–145)
TOTAL PROTEIN: 6 g/dL — AB (ref 6.5–8.1)

## 2018-07-01 LAB — URINE CULTURE: Culture: NO GROWTH

## 2018-07-01 MED ORDER — LORAZEPAM 2 MG/ML IJ SOLN
1.0000 mg | Freq: Once | INTRAMUSCULAR | Status: AC
  Administered 2018-07-01: 1 mg via INTRAVENOUS
  Filled 2018-07-01: qty 1

## 2018-07-01 MED ORDER — SODIUM CHLORIDE 0.9 % IV SOLN
INTRAVENOUS | Status: DC
  Administered 2018-07-01: 21:00:00 via INTRAVENOUS

## 2018-07-01 NOTE — Progress Notes (Signed)
PROGRESS NOTE  Jordan Kennedy FWY:[id number] DOB: 05-Mar-1956 DOA: 06/29/2018 PCP: [name], MD  HPI/Recap of past 24 hours:  Jordan Kennedy is a 62 y.o. male with medical history significant of intellectual disability, schizophrenia, SBO, chronic constipation.  Patient presents to ED at [facility] with c/o abd pain and vomiting.  No BM for past several days.  Abdomen distended since he woke up this AM.  Associated N/V.  Also has dysuria. ED Course: CT shows SBO with transition point at distal ileum apparently. Mild prominence of stool in distal colon.  NGT placed.  Gen surg consulted and hospitalist asked to admit.  06/30/2018: He removed his NG tube last night and had to be replaced this morning.  He is alert but confused.  Asked about drinking soda multiple times.  Patient reminded he is n.p.o. we will put on soft restraint to avoid re-pulling of NG tube.  07/01/18: Patient seen and examined at his bedside.  He once again removed his NG tube.  Patient is asking for food does not comprehend that he has to remain n.p.o.  Four-point restraints in place to protect patient from self-harm.  Psych consulted to assist with psychotic medications which are being held due to their secondary effects contributing to his small bowel obstruction.    Assessment/Plan: Principal Problem:   Schizophrenia ([place]) Active Problems:   Intellectual disability   Constipation   Small bowel obstruction (HCC)   AKI (acute kidney injury) ([place])   SIRS (systemic inflammatory response syndrome) ([place])  1. SBO - vs paralytic ileus given history 1. Persistent small bowel obstruction noted on abdominal x-ray done on 07/01/2018.  Continue NG tube to suction.   2. IVF-continue IV fluid hydration 3. Continue n.p.o. 4. Surgery consulted by EDP 5. 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  Will hold clozaril (looks like cogentin not on list anymore.) 6. Continue pain management 2. AKI  -improving 1. Suspect pre-renal 2. Continue IV fluid hydration  3. Repeat BMP in AM 4. Strict intake and output 3. SIRS - 1. WBCs remain persistently elevated although trending down.  EDP gave cefepime / vanc / flagyl for SIRS on presentation with unclear location. 2. Will de-escalate to just rocephin / flagyl for abd coverage for now. 3. UA negative. 4. Schizophrenia - 1. Will have to hold psych meds due to NPO status and SBO    Code Status: Full code  Family Communication: None at bedside  Disposition Plan: Back to group home possibly in 2 to 3 days when SBO has resolved and surgery has signed off   Consultants:  General surgery consulted by EDP  Procedures:  None  Antimicrobials:  Rocephin  IV Flagyl  DVT prophylaxis: Subcu Lovenox   Objective: Vitals:   06/30/18 1520 06/30/18 1945 07/01/18 0412 07/01/18 1407  BP:  (!) 148/90 126/89 (!) 152/95  Pulse:  (!) 102 (!) 105 83  Resp:  (!) 22 16 18   Temp:  99.2 F (37.3 C) 99.1 F (37.3 C) (!) 100.4 F (38 C)  TempSrc:  Oral  Oral  SpO2: 96% 94% 95% 97%  Weight:      Height:        Intake/Output Summary (Last 24 hours) at 07/01/2018 1831 Last data filed at 07/01/2018 1500 Gross per 24 hour  Intake -  Output 1600 ml  Net -1600 ml   Filed Weights   06/29/18 1613 06/30/18 0429  Weight: 90.7 kg 88.7 kg    Exam:  .  General: 62 y.o. year-old male well-developed well-nourished in no acute distress.  Alert but confused, suspect moderate cognitive deficiency. . Cardiovascular: Regular rate and rhythm with no rubs or gallops.  No thyromegaly or JVD noted. Marland Kitchen Respiratory: Clear to auscultation with no wheezes or rales. Good inspiratory effort. . Abdomen: Distended with hypoactive bowel sounds.  Nontender on palpation. . Musculoskeletal: No lower extremity edema. 2/4 pulses in all 4 extremities. Marland Kitchen Psychiatry: Mood is appropriate for condition and setting   Data Reviewed: CBC: Recent Labs  Lab  06/29/18 1623 06/30/18 0526 07/01/18 0538  WBC 18.3* 16.5* 13.2*  NEUTROABS 14.6*  --   --   HGB 15.7 14.0 12.3*  HCT 46.9 43.2 38.7*  MCV 93.2 94.9 97.7  PLT 364 359 967   Basic Metabolic Panel: Recent Labs  Lab 06/29/18 1623 06/30/18 0526 07/01/18 0538  NA 140 145 145  K 4.3 3.9 3.8  CL 101 109 112*  CO2 25 28 25   GLUCOSE 150* 132* 121*  BUN 33* 26* 19  CREATININE 2.25* 1.32* 1.06  CALCIUM 10.0 9.1 8.2*   GFR: Estimated Creatinine Clearance: 87.2 mL/min (by C-G formula based on SCr of 1.06 mg/dL). Liver Function Tests: Recent Labs  Lab 06/29/18 1623 07/01/18 0538  AST 27 19  ALT 25 17  ALKPHOS 114 71  BILITOT 0.8 0.6  PROT 8.8* 6.0*  ALBUMIN 4.6 3.2*   Recent Labs  Lab 06/29/18 1623  LIPASE 27   No results for input(s): AMMONIA in the last 168 hours. Coagulation Profile: No results for input(s): INR, PROTIME in the last 168 hours. Cardiac Enzymes: No results for input(s): CKTOTAL, CKMB, CKMBINDEX, TROPONINI in the last 168 hours. BNP (last 3 results) No results for input(s): PROBNP in the last 8760 hours. HbA1C: No results for input(s): HGBA1C in the last 72 hours. CBG: No results for input(s): GLUCAP in the last 168 hours. Lipid Profile: No results for input(s): CHOL, HDL, LDLCALC, TRIG, CHOLHDL, LDLDIRECT in the last 72 hours. Thyroid Function Tests: No results for input(s): TSH, T4TOTAL, FREET4, T3FREE, THYROIDAB in the last 72 hours. Anemia Panel: No results for input(s): VITAMINB12, FOLATE, FERRITIN, TIBC, IRON, RETICCTPCT in the last 72 hours. Urine analysis:    Component Value Date/Time   COLORURINE YELLOW 06/29/2018 2216   APPEARANCEUR HAZY (A) 06/29/2018 2216   LABSPEC 1.030 06/29/2018 2216   PHURINE 5.0 06/29/2018 2216   GLUCOSEU NEGATIVE 06/29/2018 2216   HGBUR MODERATE (A) 06/29/2018 2216   BILIRUBINUR NEGATIVE 06/29/2018 2216   BILIRUBINUR Negative 01/31/2015 1010   KETONESUR 5 (A) 06/29/2018 2216   PROTEINUR 30 (A) 06/29/2018  2216   UROBILINOGEN 0.2 01/31/2015 1010   UROBILINOGEN 1.0 06/12/2012 1315   NITRITE NEGATIVE 06/29/2018 2216   LEUKOCYTESUR NEGATIVE 06/29/2018 2216   Sepsis Labs: @LABRCNTIP (procalcitonin:4,lacticidven:4)  ) Recent Results (from the past 240 hour(s))  Blood Culture (routine x 2)     Status: None (Preliminary result)   Collection Time: 06/29/18  4:23 PM  Result Value Ref Range Status   Specimen Description   Final    BLOOD RIGHT ARM Performed at [facility], [address] [place], [place] [address]    Special Requests   Final    BOTTLES DRAWN AEROBIC AND ANAEROBIC Blood Culture results may not be optimal due to an excessive volume of blood received in culture bottles Performed at [facility], [address] [place], [place] [address]    Culture   Final  NO GROWTH 2 DAYS Performed at [facility], [place] [address] [place], [place] [address]    Report Status PENDING  Incomplete  Blood Culture (routine x 2)     Status: None (Preliminary result)   Collection Time: 06/29/18  4:45 PM  Result Value Ref Range Status   Specimen Description   Final    BLOOD LEFT ARM Performed at [facility], [address] [place], [place] [address]    Special Requests   Final    BOTTLES DRAWN AEROBIC AND ANAEROBIC Blood Culture results may not be optimal due to an excessive volume of blood received in culture bottles Performed at [facility], [address] [place], [place] [address]    Culture   Final    NO GROWTH 2 DAYS Performed at [facility], [place] [address] [place], [place] [address]    Report Status PENDING  Incomplete  Urine Culture     Status: None   Collection Time: 06/29/18 10:16 PM  Result Value Ref Range Status   Specimen Description   Final    URINE, RANDOM Performed at [place] [address], [place], [place] [address]    Special Requests   Final    NONE Performed at [facility], [place] [address], [place],  [place] [address]    Culture   Final    NO GROWTH Performed at [facility], [place] [address] [place],  [address]    Report Status 07/01/2018 FINAL  Final      Studies: Dg Abd 1 View  Result Date: 07/01/2018 CLINICAL DATA:  Status post nasogastric tube placement. EXAM: ABDOMEN - 1 VIEW COMPARISON:  Supine abdominal radiograph of earlier today at 9:03 a.m. FINDINGS: The tip of the endotracheal tube lies at the level of the GE junction. The proximal port is not visible and is presumably in the distal third of the esophagus. There remain loops of moderately distended gas-filled small bowel in throughout the abdomen. There is some gas in the right colon. There is stool in the rectum. IMPRESSION: Inadequate positioning of the esophagogastric tube. Advancement by at least 15 cm is recommended. Electronically Signed   By: David  Martinique M.D.   On: 07/01/2018 12:07   Dg Abd Portable 1v  Result Date: 07/01/2018 CLINICAL DATA:  Follow-up small bowel obstruction EXAM: PORTABLE ABDOMEN - 1 VIEW COMPARISON:  Abdomen film of 06/30/2017 and CT abdomen pelvis of 06/29/2017 FINDINGS: There has been some improvement in the gaseous distention of small bowel now measuring 50 mm compared to a 88 mm on the prior image at the area of greatest distention. No colonic bowel gas is seen and this pattern is consistent with persistent small-bowel obstruction. No opaque calculi are seen. IMPRESSION: Some improvement in the degree of gaseous distention of small bowel although persistent small bowel obstruction is noted. No colonic bowel gas is seen. Electronically Signed   By: Ivar Drape M.D.   On: 07/01/2018 10:03    Scheduled Meds: . enoxaparin (LOVENOX) injection  40 mg Subcutaneous Q24H    Continuous Infusions: . cefTRIAXone (ROCEPHIN)  IV 2 g (06/30/18 2128)  . metronidazole 500 mg (07/01/18 1613)     LOS: 2 days     Kayleen Memos, MD [facility] Pager [phone]  If 7PM-7AM, please contact night-coverage [email or web address] Password [facility] 07/01/2018, 6:31 PM

## 2018-07-01 NOTE — Progress Notes (Signed)
Patient removed NG tube/iv/bilat wrist restraints/attempted to leave room at 0345.  Confused/agitated Security called, placed back to bed with 4 security/staff.  Bilateral wrist/ankle restraints initiated,ativan 1mg  adm as per Md order.  Patient refusing NG insertion.  Total Ng out over last 14 hrs 300cc.  Patient continues to request po fluid/food.  Resting quietly at present

## 2018-07-01 NOTE — Consult Note (Signed)
[facility] Consult   Reason for Consult:  Medication management Referring Physician:  Dr. Nevada Crane Patient Identification: Jordan Kennedy MRN:  [id number] Principal Diagnosis: Schizophrenia [facility]) Diagnosis:   Patient Active Problem List   Diagnosis Date Noted  . Small bowel obstruction ([place]) [C48.889] 06/29/2018  . AKI (acute kidney injury) ([place]) [N17.9] 06/29/2018  . SIRS (systemic inflammatory response syndrome) ([facility]) [R65.10] 06/29/2018  . Abdominal distension [R14.0]   . Ileus ([place]) [K56.7] 03/07/2017  . Anemia, iron deficiency [D50.9] 09/18/2015  . Constipation [K59.00] 04/25/2012  . Leukocytosis, unspecified [D72.829] 04/25/2012  . Anxiety and depression [F41.9, F32.9] 04/25/2012  . 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 Schizophrenia ([place]) [F20.9]   . Intellectual disability [F79]     Total Time spent with patient: 1 hour  Subjective:   Taimur Fier is a 62 y.o. male patient admitted with small bowel obstruction.  HPI:   Per chart review, patient was admitted with small bowel obstruction. He has been confused. He removed his NG tube on 9/8 and it was replaced the following day. He has required soft restraints to avoid pulling out his NG tube again. He was last seen by the psychiatry consult service in 02/2017 for similar presentation. His psychotropic medications were reduced since they were thought to be contributing to constipation. Home medications include Clozaril 200 mg BID, Paxil 40 mg daily, Amantadine 100 mg qhs, Melatonin 10 mg qhs and Ambien 10 mg qhs.    On interview, Mr. Cosma reports that his mood has been okay. He reports that he has been stable on his psychotropic medications. He remembers feeling confused. He believes the date is 05/31/2018. He is oriented to person and place. He reports no problems with sleep or appetite. He requests several times to have food and soda throughout the interview.   Past Psychiatric History: Schizophrenia and IDD.   Risk  to Self:  None. Denies SI. Risk to Others:  None. Denies HI.  Prior Inpatient Therapy:  He was hospitalized several years ago.  Prior Outpatient Therapy:  He is followed by Volney Presser, NP.   Past Medical History:  Past Medical History:  Diagnosis Date  . Anemia   . Bowel obstruction ([place])   . BPH (benign prostatic hyperplasia)   . Chronic constipation   . History of ileus   . Mental retardation   . Partial bowel obstruction ([place])   . Schizophrenia ([place])    Monarche every three months;    History reviewed. No pertinent surgical history. Family History:  Family History  Problem Relation Age of Onset  . Cancer Mother        unknown type   Family Psychiatric  History: Denies  Social History:  Social History   Substance and Sexual Activity  Alcohol Use No     Social History   Substance and Sexual Activity  Drug Use No    Social History   Socioeconomic History  . Marital status: Single    Spouse name: Not on file  . Number of children: Not on file  . Years of education: Not on file  . Highest education level: Not on file  Occupational History  . Not on file  Social Needs  . Financial resource strain: Not on file  . Food insecurity:    Worry: Not on file    Inability: Not on file  . Transportation needs:    Medical: Not on file    Non-medical: Not on file  Tobacco Use  . Smoking status: Current  Every Day Smoker    Packs/day: 0.25    Years: 30.00    Pack years: 7.50    Types: Cigarettes  . Smokeless tobacco: Never Used  Substance and Sexual Activity  . Alcohol use: No  . Drug use: No  . Sexual activity: Never  Lifestyle  . Physical activity:    Days per week: Not on file    Minutes per session: Not on file  . Stress: Not on file  Relationships  . Social connections:    Talks on phone: Not on file    Gets together: Not on file    Attends religious service: Not on file    Active member of club or organization: Not on file    Attends meetings of clubs  or organizations: Not on file    Relationship status: Not on file  Other Topics Concern  . Not on file  Social History Narrative   Marital status: single      Children: none       Employment: disability due to mental retardation and schizophrenia.      Lives: group home [place] [employer] [facility]; 3 men in group home.        Tobacco: six cigarettes per day; since age 23.      Alcohol:  None      Drugs: none      Exercise:  Walking some.      Seatbelt:  100%   Additional Social History: He lives in a group home. He has been at his current group home for 2 months.     Allergies:  No Known Allergies  Labs:  Results for orders placed or performed during the hospital encounter of 06/29/18 (from the past 48 hour(s))  Comprehensive metabolic panel     Status: Abnormal   Collection Time: 06/29/18  4:23 PM  Result Value Ref Range   Sodium 140 135 - 145 mmol/L   Potassium 4.3 3.5 - 5.1 mmol/L   Chloride 101 98 - 111 mmol/L   CO2 25 22 - 32 mmol/L   Glucose, Bld 150 (H) 70 - 99 mg/dL   BUN 33 (H) 6 - 20 mg/dL   Creatinine, Ser 2.25 (H) 0.61 - 1.24 mg/dL   Calcium 10.0 8.9 - 10.3 mg/dL   Total Protein 8.8 (H) 6.5 - 8.1 g/dL   Albumin 4.6 3.5 - 5.0 g/dL   AST 27 15 - 41 U/L   ALT 25 0 - 44 U/L   Alkaline Phosphatase 114 38 - 126 U/L   Total Bilirubin 0.8 0.3 - 1.2 mg/dL   GFR calc non Af Amer 30 (L) >60 mL/min   GFR calc Af Amer 35 (L) >60 mL/min    Comment: (NOTE) The eGFR has been calculated using the CKD EPI equation. This calculation has not been validated in all clinical situations. eGFR's persistently <60 mL/min signify possible Chronic Kidney Disease.    Anion gap 14 5 - 15    Comment: Performed at [facility], [address] [place], [place] [address]  CBC WITH DIFFERENTIAL     Status: Abnormal   Collection Time: 06/29/18  4:23 PM  Result Value Ref Range   WBC 18.3 (H) 4.0 - 10.5 K/uL   RBC 5.03 4.22 - 5.81 MIL/uL   Hemoglobin 15.7  13.0 - 17.0 g/dL   HCT 46.9 39.0 - 52.0 %   MCV 93.2 78.0 - 100.0 fL   MCH 31.2 26.0 - 34.0  pg   MCHC 33.5 30.0 - 36.0 g/dL   RDW 16.6 (H) 11.5 - 15.5 %   Platelets 364 150 - 400 K/uL   Neutrophils Relative % 80 %   Neutro Abs 14.6 (H) 1.7 - 7.7 K/uL   Lymphocytes Relative 9 %   Lymphs Abs 1.6 0.7 - 4.0 K/uL   Monocytes Relative 11 %   Monocytes Absolute 2.0 (H) 0.1 - 1.0 K/uL   Eosinophils Relative 0 %   Eosinophils Absolute 0.0 0.0 - 0.7 K/uL   Basophils Relative 0 %   Basophils Absolute 0.0 0.0 - 0.1 K/uL    Comment: Performed at [facility], [address] [place], [place] [address]  Blood Culture (routine x 2)     Status: None (Preliminary result)   Collection Time: 06/29/18  4:23 PM  Result Value Ref Range   Specimen Description      BLOOD RIGHT ARM Performed at [facility], [address] [place], [place] [address]    Special Requests      BOTTLES DRAWN AEROBIC AND ANAEROBIC Blood Culture results may not be optimal due to an excessive volume of blood received in culture bottles Performed at [facility], [address] [place], [place] [address]    Culture      NO GROWTH 2 DAYS Performed at [facility], [place] [address] [place], [place] [address]    Report Status PENDING   Lipase, blood     Status: None   Collection Time: 06/29/18  4:23 PM  Result Value Ref Range   Lipase 27 11 - 51 U/L    Comment: Performed at [facility], [address] [place], [place] [address]  Blood Culture (routine x 2)     Status: None (Preliminary result)   Collection Time: 06/29/18  4:45 PM  Result Value Ref Range   Specimen Description      BLOOD LEFT ARM Performed at [facility], [address] [place], [place] [address]    Special Requests      BOTTLES DRAWN AEROBIC AND ANAEROBIC Blood Culture results may not be optimal due to an excessive volume of blood received in culture bottles Performed at [facility], [address] [place], [place] [address]    Culture      NO GROWTH 2 DAYS Performed at [facility], [place] [address], [place], [place] [address]    Report Status PENDING   I-Stat CG4 Lactic Acid, ED  (not at  [facility])     Status: Abnormal   Collection Time: 06/29/18  4:58 PM  Result Value Ref Range   Lactic Acid, Venous 2.27 (HH) 0.5 - 1.9 mmol/L   Comment NOTIFIED PHYSICIAN   I-Stat CG4 Lactic Acid, ED  (not at  [facility])     Status: None   Collection Time: 06/29/18  6:41 PM  Result Value Ref Range   Lactic Acid, Venous 0.99 0.5 - 1.9 mmol/L  Urinalysis, Routine w reflex microscopic     Status: Abnormal   Collection Time: 06/29/18 10:16 PM  Result Value Ref Range   Color, Urine YELLOW YELLOW   APPearance HAZY (A) CLEAR   Specific Gravity, Urine 1.030 1.005 - 1.030   pH 5.0 5.0 - 8.0   Glucose, UA NEGATIVE NEGATIVE mg/dL   Hgb urine dipstick MODERATE (A) NEGATIVE   Bilirubin Urine NEGATIVE NEGATIVE   Ketones,  ur 5 (A) NEGATIVE mg/dL   Protein, ur 30 (A) NEGATIVE mg/dL   Nitrite NEGATIVE NEGATIVE   Leukocytes, UA NEGATIVE NEGATIVE   RBC / HPF 11-20 0 - 5 RBC/hpf   WBC, UA 0-5 0 - 5 WBC/hpf   Bacteria, UA NONE SEEN NONE SEEN   Squamous Epithelial / LPF 0-5 0 - 5   Mucus PRESENT    Hyaline Casts, UA PRESENT     Comment: Performed at [facility], [place] [address], [place], [place] [address]  Urine Culture     Status: None   Collection Time: 06/29/18 10:16 PM  Result Value Ref Range   Specimen Description      URINE, RANDOM Performed at [facility] [address] [place], [place] [address]    Special Requests      NONE Performed at [facility],  [address], [place], [place] [address]    Culture      NO GROWTH Performed at [facility], [place] [address], [place], [place] [address]    Report Status 07/01/2018 FINAL   HIV antibody (Routine Testing)     Status: None   Collection Time: 06/30/18   5:26 AM  Result Value Ref Range   HIV Screen 4th Generation wRfx Non Reactive Non Reactive    Comment: (NOTE) Performed At: [facility] [place], [place] [id number] Rush Farmer MD IR:[id number]   CBC     Status: Abnormal   Collection Time: 06/30/18  5:26 AM  Result Value Ref Range   WBC 16.5 (H) 4.0 - 10.5 K/uL   RBC 4.55 4.22 - 5.81 MIL/uL   Hemoglobin 14.0 13.0 - 17.0 g/dL   HCT 43.2 39.0 - 52.0 %   MCV 94.9 78.0 - 100.0 fL   MCH 30.8 26.0 - 34.0 pg   MCHC 32.4 30.0 - 36.0 g/dL   RDW 16.4 (H) 11.5 - 15.5 %   Platelets 359 150 - 400 K/uL    Comment: Performed at [facility], [place] [address], [place], [place] [address]  Basic metabolic panel     Status: Abnormal   Collection Time: 06/30/18  5:26 AM  Result Value Ref Range   Sodium 145 135 - 145 mmol/L   Potassium 3.9 3.5 - 5.1 mmol/L   Chloride 109 98 - 111 mmol/L   CO2 28 22 - 32 mmol/L   Glucose, Bld 132 (H) 70 - 99 mg/dL   BUN 26 (H) 6 - 20 mg/dL   Creatinine, Ser 1.32 (H) 0.61 - 1.24 mg/dL   Calcium 9.1 8.9 - 10.3 mg/dL   GFR calc non Af Amer 57 (L) >60 mL/min   GFR calc Af Amer >60 >60 mL/min    Comment: (NOTE) The eGFR has been calculated using the CKD EPI equation. This calculation has not been validated in all clinical situations. eGFR's persistently <60 mL/min signify possible Chronic Kidney Disease.    Anion gap 8 5 - 15    Comment: Performed at [facility], [place] [address] [place],  [address]  CBC     Status: Abnormal   Collection Time: 07/01/18  5:38 AM  Result Value Ref Range   WBC 13.2 (H) 4.0 - 10.5 K/uL   RBC 3.96 (L) 4.22 - 5.81 MIL/uL   Hemoglobin 12.3 (L) 13.0 - 17.0 g/dL   HCT 38.7 (L) 39.0 - 52.0 %   MCV 97.7 78.0 - 100.0 fL   MCH  31.1 26.0 - 34.0 pg   MCHC 31.8 30.0 - 36.0 g/dL   RDW 16.5 (H) 11.5 - 15.5 %   Platelets 296 150 - 400 K/uL    Comment: Performed at [facility], [place] [address],  [place], [place] [address]  Comprehensive metabolic panel     Status: Abnormal   Collection Time: 07/01/18  5:38 AM  Result Value Ref Range   Sodium 145 135 - 145 mmol/L   Potassium 3.8 3.5 - 5.1 mmol/L   Chloride 112 (H) 98 - 111 mmol/L   CO2 25 22 - 32 mmol/L   Glucose, Bld 121 (H) 70 - 99 mg/dL   BUN 19 6 - 20 mg/dL   Creatinine, Ser 1.06 0.61 - 1.24 mg/dL   Calcium 8.2 (L) 8.9 - 10.3 mg/dL   Total Protein 6.0 (L) 6.5 - 8.1 g/dL   Albumin 3.2 (L) 3.5 - 5.0 g/dL   AST 19 15 - 41 U/L   ALT 17 0 - 44 U/L   Alkaline Phosphatase 71 38 - 126 U/L   Total Bilirubin 0.6 0.3 - 1.2 mg/dL   GFR calc non Af Amer >60 >60 mL/min   GFR calc Af Amer >60 >60 mL/min    Comment: (NOTE) The eGFR has been calculated using the CKD EPI equation. This calculation has not been validated in all clinical situations. eGFR's persistently <60 mL/min signify possible Chronic Kidney Disease.    Anion gap 8 5 - 15    Comment: Performed at [facility], [place] [address], [place], [place] [address]    Current Facility-Administered Medications  Medication Dose Route Frequency Provider Last Rate Last Dose  . acetaminophen (TYLENOL) tablet 650 mg  650 mg Oral Q6H PRN Etta Quill, DO       Or  . acetaminophen (TYLENOL) suppository 650 mg  650 mg Rectal Q6H PRN Etta Quill, DO      . cefTRIAXone (ROCEPHIN) 2 g in sodium chloride 0.9 % 100 mL IVPB  2 g Intravenous QHS Jennette Kettle M, DO 200 mL/hr at 06/30/18 2128 2 g at 06/30/18 2128  . enoxaparin (LOVENOX) injection 40 mg  40 mg Subcutaneous Q24H Jennette Kettle M, DO   40 mg at 07/01/18 9470  . fentaNYL (SUBLIMAZE) injection 50 mcg  50 mcg Intravenous Q2H PRN Etta Quill, DO   50 mcg at 06/30/18 2004  . labetalol (NORMODYNE,TRANDATE) injection 10 mg  10 mg Intravenous Q6H PRN Irene Pap N, DO   10 mg at 06/30/18 1403  . metroNIDAZOLE (FLAGYL) IVPB 500 mg  500 mg Intravenous Q8H Lorin Glass, PA-C 100 mL/hr at 07/01/18 0824 500  mg at 07/01/18 0824  . ondansetron (ZOFRAN) tablet 4 mg  4 mg Oral Q6H PRN Etta Quill, DO       Or  . ondansetron [employer]) injection 4 mg  4 mg Intravenous Q6H PRN Etta Quill, DO   4 mg at 06/30/18 0449    Musculoskeletal: Strength & Muscle Tone: within normal limits Gait & Station: unable to stand since patient in restraints.  Patient leans: N/A  Psychiatric Specialty Exam: Physical Exam  Nursing note and vitals reviewed. Constitutional: He is oriented to person, place, and time. He appears well-developed and well-nourished.  HENT:  Head: Normocephalic and atraumatic.  Neck: Normal range of motion.  Respiratory: Effort normal.  Musculoskeletal: Normal range of motion.  Neurological: He is alert and oriented to person, place, and time.  Psychiatric:  He has a normal mood and affect. His speech is normal and behavior is normal. Judgment and thought content normal. Cognition and memory are impaired.    Review of Systems  Constitutional: Positive for chills and fever.  Gastrointestinal: Positive for abdominal pain and constipation. Negative for diarrhea, nausea and vomiting.  Psychiatric/Behavioral: Negative for depression, hallucinations and suicidal ideas. The patient does not have insomnia.   All other systems reviewed and are negative.   Blood pressure 126/89, pulse (!) 105, temperature 99.1 F (37.3 C), resp. rate 16, height 6' 2" (1.88 m), weight 88.7 kg, SpO2 95 %.Body mass index is 25.11 kg/m.  General Appearance: Fairly Groomed, middle aged, African American male, wearing a hospital gown with 2 point soft restraints with NG tube placement who is lying in bed. NAD.  Eye Contact:  Good  Speech:  Clear and Coherent and Normal Rate  Volume:  Normal  Mood:  Euthymic  Affect:  Appropriate and Congruent  Thought Process:  Goal Directed, Linear and Descriptions of Associations: Intact  Orientation:  Full (Time, Place, and Person)  Thought Content:  Logical  Suicidal  Thoughts:  No  Homicidal Thoughts:  No  Memory:  Immediate;   Good Recent;   Good Remote;   Good  Judgement:  Poor  Insight:  Shallow  Psychomotor Activity:  Normal  Concentration:  Concentration: Good and Attention Span: Good  Recall:  Good  Fund of Knowledge:  Fair  Language:  Good  Akathisia:  No  Handed:  Right  AIMS (if indicated):   N/A  Assets:  Housing  ADL's:  Intact  Cognition:  Impaired. History of IDD.   Sleep:   Okay   Assessment:  Jordynn Perrier is a 62 y.o. male who was admitted with small bowel obstruction. Psychiatry was consulted for medication management due to NPO status. Patient has a NG tube and therefore can have his medications crushed with the exception of Amantadine which is available in a syrup. He denies SI, HI or AVH and does not appear to be responding to internal stimuli. He does not warrant inpatient psychiatric hospitalization at this time.   Treatment Plan Summary: -Continue home medications: Clozaril 200 mg BID for schizophrenia, Paxil 40 mg daily for depression, Amantadine 100 mg qhs for EPS prevention, Melatonin 10 mg qhs for insomnia and Ambien 10 mg qhs for insomnia.   -Please crush Clozaril, Paxil, Melatonin and Ambien while patient is NPO. Provide Amantadine syrup.  -EKG reviewed and QTc 436 on 9/8. Please closely monitor when starting or increasing QTc prolonging agents.  -Psychiatry will sign off on patient at this time. Please consult psychiatry again as needed.   Disposition: No evidence of imminent risk to self or others at present.   Patient does not meet criteria for psychiatric inpatient admission.  Faythe Dingwall, DO 07/01/2018 12:46 PM

## 2018-07-01 NOTE — Progress Notes (Signed)
Patient's NG tube advance per radiologists recommendation.  Patient immediately put out 500 cc's of output.

## 2018-07-02 LAB — CBC
HCT: 39.6 % (ref 39.0–52.0)
Hemoglobin: 12.7 g/dL — ABNORMAL LOW (ref 13.0–17.0)
MCH: 30.7 pg (ref 26.0–34.0)
MCHC: 32.1 g/dL (ref 30.0–36.0)
MCV: 95.7 fL (ref 78.0–100.0)
PLATELETS: 333 10*3/uL (ref 150–400)
RBC: 4.14 MIL/uL — AB (ref 4.22–5.81)
RDW: 15.9 % — AB (ref 11.5–15.5)
WBC: 12 10*3/uL — AB (ref 4.0–10.5)

## 2018-07-02 MED ORDER — LORAZEPAM 2 MG/ML IJ SOLN
1.0000 mg | Freq: Once | INTRAMUSCULAR | Status: AC
  Administered 2018-07-02: 1 mg via INTRAVENOUS
  Filled 2018-07-02: qty 1

## 2018-07-02 MED ORDER — SODIUM CHLORIDE 0.45 % IV SOLN
INTRAVENOUS | Status: DC
  Administered 2018-07-02 – 2018-07-04 (×4): via INTRAVENOUS

## 2018-07-02 NOTE — Progress Notes (Addendum)
PROGRESS NOTE  Jordan Kennedy OFB:[id number] DOB: 12/14/1955 DOA: 06/29/2018 PCP: [name], MD  HPI/Recap of past 24 hours:  Jordan Kennedy is a 62 y.o. male with medical history significant of intellectual disability, schizophrenia, SBO, chronic constipation.  Patient presents to ED at [facility] with c/o abd pain and vomiting.  No BM for past several days.  Abdomen distended since he woke up this AM.  Associated N/V.  Also has dysuria. ED Course: CT shows SBO with transition point at distal ileum apparently. Mild prominence of stool in distal colon.  NGT placed.  Gen surg consulted and hospitalist asked to admit.  06/30/2018: He removed his NG tube last night and had to be replaced this morning.  He is alert but confused.  Asked about drinking soda multiple times.  Patient reminded he is n.p.o. we will put on soft restraint to avoid re-pulling of NG tube.  07/01/18: Patient seen and examined at his bedside.  He once again removed his NG tube.  Patient is asking for food does not comprehend that he has to remain n.p.o.  Four-point restraints in place to protect patient from self-harm.  Psych consulted to assist with psychotic medications which are being held due to their secondary effects contributing to his small bowel obstruction.  07/02/2018: Patient seen and examined at his bedside.  He has four-point restraints in place.  Still asking for food.  NG tube in place with lots of output from suction.  Denies abdominal pain or nausea.    Assessment/Plan: Principal Problem:   Schizophrenia ([place]) Active Problems:   Intellectual disability   Constipation   Small bowel obstruction (HCC)   AKI (acute kidney injury) ([place])   SIRS (systemic inflammatory response syndrome) ([place])  1. SBO - vs paralytic ileus given history 1. Persistent small bowel obstruction noted on abdominal x-ray done on 07/01/2018.  Continue NG tube to suction.   2. IVF-continue IV fluid hydration 3. Continue  n.p.o. 4. Surgery consulted by EDP 5. 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  Will hold clozaril (looks like cogentin not on list anymore.) 6. Continue pain management 7. Repeat abdominal x-ray in the morning 07/03/2018 2. AKI -improving 1. Suspect pre-renal 2. Continue IV fluid hydration  3. Strict intake and output 4. Avoid nephrotoxic agents 5. Obtain BMP in the morning 3. SIRS - 1. WBCs remain persistently elevated although trending down.  EDP gave cefepime / vanc / flagyl for SIRS on presentation with unclear location. 2. Will de-escalate to just rocephin / flagyl for abd coverage for now. 3. UA negative. 4. Schizophrenia - 1. Will have to hold psych meds due to NPO status and SBO  Delirium Pulling at NG tube 4 point restraint to protect patient from harm Reassess frequently    Code Status: Full code  Family Communication: None at bedside  Disposition Plan: Back to group home possibly in 2 to 3 days when SBO has resolved and surgery has signed off   Consultants:  General surgery consulted by EDP  Procedures:  None  Antimicrobials:  Rocephin  IV Flagyl  DVT prophylaxis: Subcu Lovenox   Objective: Vitals:   07/01/18 1953 07/02/18 0442 07/02/18 1259 07/02/18 1529  BP: (!) 157/99 (!) 160/105 (!) 194/120 (!) 163/105  Pulse: 82 86 92   Resp: 16 16 16    Temp: 99 F (37.2 C) 98.7 F (37.1 C) 98.4 F (36.9 C)   TempSrc: Oral Oral    SpO2: 96% 98% 96%  Weight:      Height:        Intake/Output Summary (Last 24 hours) at 07/02/2018 1841 Last data filed at 07/02/2018 1500 Gross per 24 hour  Intake 2387.21 ml  Output 4050 ml  Net -1662.79 ml   Filed Weights   06/29/18 1613 06/30/18 0429  Weight: 90.7 kg 88.7 kg    Exam:  . General: 62 y.o. year-old male well-developed well-nourished in no acute distress.  Alert but confused. . Cardiovascular: Regular rate and rhythm with no rubs or gallops.  No thyromegaly or JVD  noted. Marland Kitchen Respiratory: Clear to auscultation with no wheezes or rales. Good inspiratory effort. . Abdomen: Distended with hypoactive bowel sounds.  Nontender on palpation. . Musculoskeletal: No lower extremity edema. 2/4 pulses in all 4 extremities.  Four-point restraints in place. Marland Kitchen Psychiatry: Mood is appropriate for condition and setting   Data Reviewed: CBC: Recent Labs  Lab 06/29/18 1623 06/30/18 0526 07/01/18 0538 07/02/18 0612  WBC 18.3* 16.5* 13.2* 12.0*  NEUTROABS 14.6*  --   --   --   HGB 15.7 14.0 12.3* 12.7*  HCT 46.9 43.2 38.7* 39.6  MCV 93.2 94.9 97.7 95.7  PLT 364 359 296 500   Basic Metabolic Panel: Recent Labs  Lab 06/29/18 1623 06/30/18 0526 07/01/18 0538  NA 140 145 145  K 4.3 3.9 3.8  CL 101 109 112*  CO2 25 28 25   GLUCOSE 150* 132* 121*  BUN 33* 26* 19  CREATININE 2.25* 1.32* 1.06  CALCIUM 10.0 9.1 8.2*   GFR: Estimated Creatinine Clearance: 87.2 mL/min (by C-G formula based on SCr of 1.06 mg/dL). Liver Function Tests: Recent Labs  Lab 06/29/18 1623 07/01/18 0538  AST 27 19  ALT 25 17  ALKPHOS 114 71  BILITOT 0.8 0.6  PROT 8.8* 6.0*  ALBUMIN 4.6 3.2*   Recent Labs  Lab 06/29/18 1623  LIPASE 27   No results for input(s): AMMONIA in the last 168 hours. Coagulation Profile: No results for input(s): INR, PROTIME in the last 168 hours. Cardiac Enzymes: No results for input(s): CKTOTAL, CKMB, CKMBINDEX, TROPONINI in the last 168 hours. BNP (last 3 results) No results for input(s): PROBNP in the last 8760 hours. HbA1C: No results for input(s): HGBA1C in the last 72 hours. CBG: No results for input(s): GLUCAP in the last 168 hours. Lipid Profile: No results for input(s): CHOL, HDL, LDLCALC, TRIG, CHOLHDL, LDLDIRECT in the last 72 hours. Thyroid Function Tests: No results for input(s): TSH, T4TOTAL, FREET4, T3FREE, THYROIDAB in the last 72 hours. Anemia Panel: No results for input(s): VITAMINB12, FOLATE, FERRITIN, TIBC, IRON,  RETICCTPCT in the last 72 hours. Urine analysis:    Component Value Date/Time   COLORURINE YELLOW 06/29/2018 2216   APPEARANCEUR HAZY (A) 06/29/2018 2216   LABSPEC 1.030 06/29/2018 2216   PHURINE 5.0 06/29/2018 2216   GLUCOSEU NEGATIVE 06/29/2018 2216   HGBUR MODERATE (A) 06/29/2018 2216   BILIRUBINUR NEGATIVE 06/29/2018 2216   BILIRUBINUR Negative 01/31/2015 1010   KETONESUR 5 (A) 06/29/2018 2216   PROTEINUR 30 (A) 06/29/2018 2216   UROBILINOGEN 0.2 01/31/2015 1010   UROBILINOGEN 1.0 06/12/2012 1315   NITRITE NEGATIVE 06/29/2018 2216   LEUKOCYTESUR NEGATIVE 06/29/2018 2216   Sepsis Labs: @LABRCNTIP (procalcitonin:4,lacticidven:4)  ) Recent Results (from the past 240 hour(s))  Blood Culture (routine x 2)     Status: None (Preliminary result)   Collection Time: 06/29/18  4:23 PM  Result Value Ref Range Status   Specimen Description   Final  BLOOD RIGHT ARM Performed at [facility], [address] [place], [place] [address]    Special Requests   Final    BOTTLES DRAWN AEROBIC AND ANAEROBIC Blood Culture results may not be optimal due to an excessive volume of blood received in culture bottles Performed at [facility], [address] [place], [place] [address]    Culture   Final    NO GROWTH 3 DAYS Performed at [facility], [place] [address] [place], [place] [address]    Report Status PENDING  Incomplete  Blood Culture (routine x 2)     Status: None (Preliminary result)   Collection Time: 06/29/18  4:45 PM  Result Value Ref Range Status   Specimen Description   Final    BLOOD LEFT ARM Performed at [facility], [address] [place], [place] [address]    Special Requests   Final    BOTTLES DRAWN AEROBIC AND ANAEROBIC Blood Culture results may not be optimal due to an excessive volume of blood received in culture bottles Performed at [facility], [address] [place], [place] [address]    Culture   Final    NO  GROWTH 3 DAYS Performed at [facility], [place] [address], [place], [place] [address]    Report Status PENDING  Incomplete  Urine Culture     Status: None   Collection Time: 06/29/18 10:16 PM  Result Value Ref Range Status   Specimen Description   Final    URINE, RANDOM Performed at [facility] [address], [place], [place] [address]    Special Requests   Final    NONE Performed at [facility], [place] [address], [place], [place] [address]    Culture   Final    NO GROWTH Performed at [facility], [place] [address], [place], [place] [address]    Report Status 07/01/2018 FINAL  Final      Studies: No results found.  Scheduled Meds: . enoxaparin (LOVENOX) injection  40 mg Subcutaneous Q24H    Continuous Infusions: . sodium chloride 75 mL/hr at 07/02/18 0557  . cefTRIAXone (ROCEPHIN)  IV 2 g (07/01/18 2123)  . metronidazole 500 mg (07/02/18 1647)     LOS: 3 days     Kayleen Memos, MD [facility] Pager [phone]  If 7PM-7AM, please contact night-coverage [email or web address] Password [facility] 07/02/2018, 6:41 PM

## 2018-07-02 NOTE — Care Management Important Message (Signed)
Important Message  Patient Details  Name: Titan Karner MRN: [id number] Date of Birth: 1956-06-17   Medicare Important Message Given:  Yes    Kerin Salen 07/02/2018, 11:05 AMImportant Message  Patient Details  Name: Jones Viviani MRN: [id number] Date of Birth: 06-21-1956   Medicare Important Message Given:  Yes    Kerin Salen 07/02/2018, 11:05 AM

## 2018-07-03 ENCOUNTER — Inpatient Hospital Stay (HOSPITAL_COMMUNITY): Payer: Medicare Other

## 2018-07-03 LAB — GLUCOSE, CAPILLARY: GLUCOSE-CAPILLARY: 111 mg/dL — AB (ref 70–99)

## 2018-07-03 NOTE — Progress Notes (Signed)
PROGRESS NOTE  Jordan Kennedy LFY:[id number] DOB: [birth year] DOA: 06/29/2018 PCP: [name], MD  HPI/Recap of past 24 hours:  Jordan Kennedy is a 62 y.o. male with medical history significant of intellectual disability, schizophrenia, SBO, chronic constipation.  Patient presents to ED at [facility] with c/o abd pain and vomiting.  No BM for past several days.  Abdomen distended since he woke up this AM.  Associated N/V.  Also has dysuria. ED Course: CT shows SBO with transition point at distal ileum apparently. Mild prominence of stool in distal colon.  NGT placed.  Gen surg consulted and hospitalist asked to admit.  06/30/2018: He removed his NG tube last night and had to be replaced this morning.  He is alert but confused.  Asked about drinking soda multiple times.  Patient reminded he is n.p.o. we will put on soft restraint to avoid re-pulling of NG tube.  07/01/18: Patient seen and examined at his bedside.  He once again removed his NG tube.  Patient is asking for food does not comprehend that he has to remain n.p.o.  Four-point restraints in place to protect patient from self-harm.  Psych consulted to assist with psychotic medications which are being held due to their secondary effects contributing to his small bowel obstruction.  07/02/2018: Patient seen and examined at his bedside.  He has four-point restraints in place.  Still asking for food.  NG tube in place with lots of output from suction.  Denies abdominal pain or nausea.  07/03/2018: Patient removed his NG tube again last night and was replaced.  Also had 2 bowel movement reported that were formed.  He denies nausea or abdominal pain and admits to flatulence.  Unclear how reliable his input is as the pt has a history of intellectual disability.   Assessment/Plan: Principal Problem:   Schizophrenia ([place]) Active Problems:   Intellectual disability   Constipation   Small bowel obstruction (HCC)   AKI (acute kidney injury)  ([place])   SIRS (systemic inflammatory response syndrome) ([place])  1. SBO - vs paralytic ileus given history 1. Small bowel obstruction noted on abdominal x-ray done on 07/01/2018.  NG tube to suction.  Reportedly had 2 bowel movements overnight 07/03/2018 2.  IV fluid hydration 3. Surgery consulted by EDP 4. 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  Will hold clozaril (looks like cogentin not on list anymore.) 2. AKI -improving 1. Suspect pre-renal 2. Continue IV fluid hydration  3. Strict intake and output 4. Avoid nephrotoxic agents 5. Obtain BMP in the morning 3. SIRS - 1. Leukocytosis is persistent however WBC is trending down. EDP gave cefepime / vanc / flagyl for SIRS on presentation with unclear location. 2. Continue IV Rocephin and IV Flagyl.  Negative blood cultures x2 to date.  Obtain CBC in the morning 4. Schizophrenia - 1. Will have to hold psych meds due to NPO status and SBO  Delirium Pulling at NG tube Reassess frequently on restraint    Code Status: Full code  Family Communication: None at bedside  Disposition Plan: Back to group home in 1 to 2 days when surgery signed off.  Consultants:  General surgery consulted by EDP  Procedures:  None  Antimicrobials:  Rocephin  IV Flagyl  DVT prophylaxis: Subcu Lovenox   Objective: Vitals:   07/02/18 2131 07/03/18 0519 07/03/18 1407 07/03/18 1455  BP: (!) 165/114 (!) 167/98 (!) 155/103 (!) 160/103  Pulse: 92 92 86 77  Resp: 18 20 16  18  Temp: 98.1 F (36.7 C) 98.6 F (37 C) 98.1 F (36.7 C) 98.7 F (37.1 C)  TempSrc: Oral Oral    SpO2: 94% 95% 96% 94%  Weight:      Height:        Intake/Output Summary (Last 24 hours) at 07/03/2018 1541 Last data filed at 07/03/2018 1237 Gross per 24 hour  Intake 120 ml  Output 1000 ml  Net -880 ml   Filed Weights   06/29/18 1613 06/30/18 0429  Weight: 90.7 kg 88.7 kg    Exam:  . General: 62 y.o. year-old male well-developed  well-nourished in no acute distress.  Has NG tube in place.  Alert but confused in the state of intellectual disability. . Cardiovascular: Regular rate and rhythm with no rubs or gallops.  No thyromegaly or JVD noted. Marland Kitchen Respiratory: Clear to auscultation with no wheezes or rales. Good inspiratory effort. . Abdomen: Distended abdomen nontender with hypoactive bowel sounds. . Musculoskeletal: No lower extremity edema. 2/4 pulses in all 4 extremities.  Four-point restraints in place. Marland Kitchen Psychiatry: Mood is appropriate for condition and setting   Data Reviewed: CBC: Recent Labs  Lab 06/29/18 1623 06/30/18 0526 07/01/18 0538 07/02/18 0612  WBC 18.3* 16.5* 13.2* 12.0*  NEUTROABS 14.6*  --   --   --   HGB 15.7 14.0 12.3* 12.7*  HCT 46.9 43.2 38.7* 39.6  MCV 93.2 94.9 97.7 95.7  PLT 364 359 296 010   Basic Metabolic Panel: Recent Labs  Lab 06/29/18 1623 06/30/18 0526 07/01/18 0538  NA 140 145 145  K 4.3 3.9 3.8  CL 101 109 112*  CO2 25 28 25   GLUCOSE 150* 132* 121*  BUN 33* 26* 19  CREATININE 2.25* 1.32* 1.06  CALCIUM 10.0 9.1 8.2*   GFR: Estimated Creatinine Clearance: 87.2 mL/min (by C-G formula based on SCr of 1.06 mg/dL). Liver Function Tests: Recent Labs  Lab 06/29/18 1623 07/01/18 0538  AST 27 19  ALT 25 17  ALKPHOS 114 71  BILITOT 0.8 0.6  PROT 8.8* 6.0*  ALBUMIN 4.6 3.2*   Recent Labs  Lab 06/29/18 1623  LIPASE 27   No results for input(s): AMMONIA in the last 168 hours. Coagulation Profile: No results for input(s): INR, PROTIME in the last 168 hours. Cardiac Enzymes: No results for input(s): CKTOTAL, CKMB, CKMBINDEX, TROPONINI in the last 168 hours. BNP (last 3 results) No results for input(s): PROBNP in the last 8760 hours. HbA1C: No results for input(s): HGBA1C in the last 72 hours. CBG: Recent Labs  Lab 07/02/18 2229  GLUCAP 111*   Lipid Profile: No results for input(s): CHOL, HDL, LDLCALC, TRIG, CHOLHDL, LDLDIRECT in the last 72  hours. Thyroid Function Tests: No results for input(s): TSH, T4TOTAL, FREET4, T3FREE, THYROIDAB in the last 72 hours. Anemia Panel: No results for input(s): VITAMINB12, FOLATE, FERRITIN, TIBC, IRON, RETICCTPCT in the last 72 hours. Urine analysis:    Component Value Date/Time   COLORURINE YELLOW 06/29/2018 2216   APPEARANCEUR HAZY (A) 06/29/2018 2216   LABSPEC 1.030 06/29/2018 2216   PHURINE 5.0 06/29/2018 2216   GLUCOSEU NEGATIVE 06/29/2018 2216   HGBUR MODERATE (A) 06/29/2018 2216   BILIRUBINUR NEGATIVE 06/29/2018 2216   BILIRUBINUR Negative 01/31/2015 1010   KETONESUR 5 (A) 06/29/2018 2216   PROTEINUR 30 (A) 06/29/2018 2216   UROBILINOGEN 0.2 01/31/2015 1010   UROBILINOGEN 1.0 06/12/2012 1315   NITRITE NEGATIVE 06/29/2018 2216   LEUKOCYTESUR NEGATIVE 06/29/2018 2216   Sepsis Labs: @LABRCNTIP (procalcitonin:4,lacticidven:4)  )  Recent Results (from the past 240 hour(s))  Blood Culture (routine x 2)     Status: None (Preliminary result)   Collection Time: 06/29/18  4:23 PM  Result Value Ref Range Status   Specimen Description   Final    BLOOD RIGHT ARM Performed at [facility], [address] [place], [place] [address]    Special Requests   Final    BOTTLES DRAWN AEROBIC AND ANAEROBIC Blood Culture results may not be optimal due to an excessive volume of blood received in culture bottles Performed at [facility], [address] [place], [place] [address]    Culture   Final    NO GROWTH 4 DAYS Performed at [facility], [place] [address] [place], [place] [address]    Report Status PENDING  Incomplete  Blood Culture (routine x 2)     Status: None (Preliminary result)   Collection Time: 06/29/18  4:45 PM  Result Value Ref Range Status   Specimen Description   Final    BLOOD LEFT ARM Performed at [facility], [address] [place], [place] [address]    Special Requests   Final    BOTTLES DRAWN AEROBIC AND ANAEROBIC Blood  Culture results may not be optimal due to an excessive volume of blood received in culture bottles Performed at [facility], [address] [place], [place] [address]    Culture   Final    NO GROWTH 4 DAYS Performed at [facility], [place] [address], [place], [place] [address]    Report Status PENDING  Incomplete  Urine Culture     Status: None   Collection Time: 06/29/18 10:16 PM  Result Value Ref Range Status   Specimen Description   Final    URINE, RANDOM Performed at [facility] [address] [place], [place] [address]    Special Requests   Final    NONE Performed at [facility], [place] [address], [place], [place] [address]    Culture   Final    NO GROWTH Performed at [facility], [place] [address] [place], [place] [address]    Report Status 07/01/2018 FINAL  Final      Studies: Dg Abd Portable 1v  Result Date: 07/03/2018 CLINICAL DATA:  NG tube placement EXAM: PORTABLE ABDOMEN - 1 VIEW COMPARISON:  KUB of 07/01/2018 FINDINGS: The tip of the NG tube is probably within the gastroesophageal junction and could be advanced 7 several cm. There may be minimal atelectasis at the left lung base. The bowel gas pattern is nonspecific. IMPRESSION: 1. Tip of NG tube probably within the gastroesophageal junction. Recommend advancing into the more distal stomach. 2. Probable mild atelectasis at the left lung base. Correlate clinically. Electronically Signed   By: Ivar Drape M.D.   On: 07/03/2018 10:10    Scheduled Meds: . enoxaparin (LOVENOX) injection  40 mg Subcutaneous Q24H    Continuous Infusions: . sodium chloride 75 mL/hr at 07/02/18 2137  . cefTRIAXone (ROCEPHIN)  IV 2 g (07/02/18 2139)  . metronidazole 500 mg (07/03/18 0820)     LOS: 4 days     Kayleen Memos, MD [facility] Pager [phone]  If 7PM-7AM, please contact night-coverage [email or web address] Password [facility] 07/03/2018, 3:41 PM

## 2018-07-03 NOTE — Progress Notes (Signed)
Patient pulled out IV access and NGT and ran down the hall. Security called and pt escorted back to room.  Pt calm and apologetic.  New IV placed in L forearm; pt tolerated well and IV ativan given.  07/03/18 0130-NGT placed to left nare. Pt tolerated with no trouble.  Pt remained calm the remainder of shift.

## 2018-07-04 ENCOUNTER — Inpatient Hospital Stay (HOSPITAL_COMMUNITY): Payer: Medicare Other

## 2018-07-04 LAB — CULTURE, BLOOD (ROUTINE X 2)
CULTURE: NO GROWTH
Culture: NO GROWTH

## 2018-07-04 LAB — CBC
HCT: 41.1 % (ref 39.0–52.0)
Hemoglobin: 13.8 g/dL (ref 13.0–17.0)
MCH: 31.1 pg (ref 26.0–34.0)
MCHC: 33.6 g/dL (ref 30.0–36.0)
MCV: 92.6 fL (ref 78.0–100.0)
PLATELETS: 377 10*3/uL (ref 150–400)
RBC: 4.44 MIL/uL (ref 4.22–5.81)
RDW: 14.9 % (ref 11.5–15.5)
WBC: 16.5 10*3/uL — AB (ref 4.0–10.5)

## 2018-07-04 LAB — LACTIC ACID, PLASMA
Lactic Acid, Venous: 0.8 mmol/L (ref 0.5–1.9)
Lactic Acid, Venous: 0.8 mmol/L (ref 0.5–1.9)

## 2018-07-04 LAB — BASIC METABOLIC PANEL
ANION GAP: 17 — AB (ref 5–15)
BUN: 12 mg/dL (ref 6–20)
CALCIUM: 8.8 mg/dL — AB (ref 8.9–10.3)
CO2: 25 mmol/L (ref 22–32)
Chloride: 98 mmol/L (ref 98–111)
Creatinine, Ser: 0.92 mg/dL (ref 0.61–1.24)
Glucose, Bld: 101 mg/dL — ABNORMAL HIGH (ref 70–99)
Potassium: 3 mmol/L — ABNORMAL LOW (ref 3.5–5.1)
Sodium: 140 mmol/L (ref 135–145)

## 2018-07-04 LAB — MAGNESIUM: Magnesium: 2 mg/dL (ref 1.7–2.4)

## 2018-07-04 MED ORDER — POTASSIUM CHLORIDE 10 MEQ/100ML IV SOLN
10.0000 meq | INTRAVENOUS | Status: AC
  Administered 2018-07-04 – 2018-07-05 (×6): 10 meq via INTRAVENOUS
  Filled 2018-07-04 (×4): qty 100

## 2018-07-04 MED ORDER — DEXTROSE-NACL 5-0.45 % IV SOLN
INTRAVENOUS | Status: DC
  Administered 2018-07-04 – 2018-07-06 (×3): via INTRAVENOUS

## 2018-07-04 NOTE — Progress Notes (Signed)
PROGRESS NOTE  Jordan Kennedy RAQ:[id number] DOB: June 29, 1956 DOA: 06/29/2018 PCP: [name], MD  HPI/Recap of past 24 hours:  Jordan Kennedy is a 62 y.o. male with medical history significant of intellectual disability, schizophrenia, SBO, chronic constipation.  Patient presents to ED at [facility] with c/o abd pain and vomiting.  No BM for past several days.  Abdomen distended since he woke up this AM.  Associated N/V.  Also has dysuria. ED Course: CT shows SBO with transition point at distal ileum apparently. Mild prominence of stool in distal colon.  NGT placed.  Gen surg consulted and hospitalist asked to admit.  Hospital course complicated by intermittent confusions with pulling at his NG tube x2.  Psych consulted to adjust psych meds while n.p.o.  07/04/2018: Patient seen and examined at his bedside.  He denies abdominal pain and nausea.  NG tube in place.  Minimal mild bowel sounds noted.  Attempted to reach out general surgery again today.    Assessment/Plan: Principal Problem:   Schizophrenia ([place]) Active Problems:   Intellectual disability   Constipation   Small bowel obstruction (HCC)   AKI (acute kidney injury) ([place])   SIRS (systemic inflammatory response syndrome) ([place])  1. SBO - vs paralytic ileus given history 1. Small bowel obstruction noted on abdominal x-ray done on 07/01/2018.  NG tube to suction.  Reportedly had 2 bowel movements overnight 07/03/2018 2. Continue IV fluid hydration 3. Surgery consulted by EDP 4. 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  Will hold clozaril (looks like cogentin not on list anymore.) 5. Obtain lactic acid level  6. Continue IV fluid hydration with D5 half normal saline since n.p.o. 2. AKI -resolved 1. Suspect pre-renal 2. Continue IV fluid hydration  3. Strict intake and output 4. Avoid nephrotoxic agents 5. Obtain BMP in the morning 3. SIRS - 1. Leukocytosis is persistent. EDP gave cefepime / vanc /  flagyl for SIRS on presentation with unclear location. 2. Continue IV Rocephin and IV Flagyl.  Negative blood cultures x2 to date.  Obtain CBC in the morning 4. Schizophrenia - 1. Will have to hold psych meds due to NPO status and SBO  Delirium Pulling at NG tube Reassess frequently on restraint  Hypokalemia Potassium 3.0 Repleted with IV potassium 40 mEq once Repeat BMP in the morning N.p.o. D5 half-normal saline IV fluid hydration while n.p.o.  Worsening leukocytosis Lactic acid ordered Continue Rocephin and IV Flagyl empirically Repeat CBC in the morning  Severe mental deficiency Reorient as needed  Accelerated hypertension Not on antihypertensive medications at home Start IV hydralazine 10 mg as needed every 6 hours for systolic blood pressure greater than 456 and diastolic greater than 256. Continue to monitor vital signs    Code Status: Full code  Family Communication: None at bedside  Disposition Plan: Back to group home in 1 to 2 days when surgery signed off.  Consultants:  General surgery consulted by EDP  Procedures:  None  Antimicrobials:  Rocephin  IV Flagyl  DVT prophylaxis: Subcu Lovenox   Objective: Vitals:   07/03/18 1455 07/03/18 2124 07/04/18 0558 07/04/18 1326  BP: (!) 160/103 (!) 167/99 (!) 155/93 (!) 166/100  Pulse: 77 83 80 77  Resp: 18 17 20 18   Temp: 98.7 F (37.1 C) 98.8 F (37.1 C) 98.9 F (37.2 C) 98.5 F (36.9 C)  TempSrc:      SpO2: 94% 95% 94% 95%  Weight:      Height:  Intake/Output Summary (Last 24 hours) at 07/04/2018 1659 Last data filed at 07/04/2018 1432 Gross per 24 hour  Intake 2536.01 ml  Output 2150 ml  Net 386.01 ml   Filed Weights   06/29/18 1613 06/30/18 0429  Weight: 90.7 kg 88.7 kg    Exam:  . General: 62 y.o. year-old male well-developed well-nourished in no acute distress.  Has NG tube in place alert in the setting of mental deficiency. . Cardiovascular: Regular rate and rhythm  with no rubs or gallops.  No thyromegaly or JVD noted. [place]land [place] Respiratory: Clear to auscultation with no wheezes or rales. Good inspiratory effort. . Abdomen: Distended abdomen nontender with hypoactive bowel sounds. . Musculoskeletal: No lower extremity edema. 2/4 pulses in all 4 extremities.  Four-point restraints in place. Marland Kitchen Psychiatry: Mood is appropriate for condition and setting   Data Reviewed: CBC: Recent Labs  Lab 06/29/18 1623 06/30/18 0526 07/01/18 0538 07/02/18 0612 07/04/18 0521  WBC 18.3* 16.5* 13.2* 12.0* 16.5*  NEUTROABS 14.6*  --   --   --   --   HGB 15.7 14.0 12.3* 12.7* 13.8  HCT 46.9 43.2 38.7* 39.6 41.1  MCV 93.2 94.9 97.7 95.7 92.6  PLT 364 359 296 333 500   Basic Metabolic Panel: Recent Labs  Lab 06/29/18 1623 06/30/18 0526 07/01/18 0538 07/04/18 0521  NA 140 145 145 140  K 4.3 3.9 3.8 3.0*  CL 101 109 112* 98  CO2 25 28 25 25   GLUCOSE 150* 132* 121* 101*  BUN 33* 26* 19 12  CREATININE 2.25* 1.32* 1.06 0.92  CALCIUM 10.0 9.1 8.2* 8.8*  MG  --   --   --  2.0   GFR: Estimated Creatinine Clearance: 100.5 mL/min (by C-G formula based on SCr of 0.92 mg/dL). Liver Function Tests: Recent Labs  Lab 06/29/18 1623 07/01/18 0538  AST 27 19  ALT 25 17  ALKPHOS 114 71  BILITOT 0.8 0.6  PROT 8.8* 6.0*  ALBUMIN 4.6 3.2*   Recent Labs  Lab 06/29/18 1623  LIPASE 27   No results for input(s): AMMONIA in the last 168 hours. Coagulation Profile: No results for input(s): INR, PROTIME in the last 168 hours. Cardiac Enzymes: No results for input(s): CKTOTAL, CKMB, CKMBINDEX, TROPONINI in the last 168 hours. BNP (last 3 results) No results for input(s): PROBNP in the last 8760 hours. HbA1C: No results for input(s): HGBA1C in the last 72 hours. CBG: Recent Labs  Lab 07/02/18 2229  GLUCAP 111*   Lipid Profile: No results for input(s): CHOL, HDL, LDLCALC, TRIG, CHOLHDL, LDLDIRECT in the last 72 hours. Thyroid Function Tests: No results for input(s):  TSH, T4TOTAL, FREET4, T3FREE, THYROIDAB in the last 72 hours. Anemia Panel: No results for input(s): VITAMINB12, FOLATE, FERRITIN, TIBC, IRON, RETICCTPCT in the last 72 hours. Urine analysis:    Component Value Date/Time   COLORURINE YELLOW 06/29/2018 2216   APPEARANCEUR HAZY (A) 06/29/2018 2216   LABSPEC 1.030 06/29/2018 2216   PHURINE 5.0 06/29/2018 2216   GLUCOSEU NEGATIVE 06/29/2018 2216   HGBUR MODERATE (A) 06/29/2018 2216   BILIRUBINUR NEGATIVE 06/29/2018 2216   BILIRUBINUR Negative 01/31/2015 1010   KETONESUR 5 (A) 06/29/2018 2216   PROTEINUR 30 (A) 06/29/2018 2216   UROBILINOGEN 0.2 01/31/2015 1010   UROBILINOGEN 1.0 06/12/2012 1315   NITRITE NEGATIVE 06/29/2018 2216   LEUKOCYTESUR NEGATIVE 06/29/2018 2216   Sepsis Labs: @LABRCNTIP (procalcitonin:4,lacticidven:4)  ) Recent Results (from the past 240 hour(s))  Blood Culture (routine x 2)  Status: None   Collection Time: 06/29/18  4:23 PM  Result Value Ref Range Status   Specimen Description   Final    BLOOD RIGHT ARM Performed at [facility], [address] [place], [place] [address]    Special Requests   Final    BOTTLES DRAWN AEROBIC AND ANAEROBIC Blood Culture results may not be optimal due to an excessive volume of blood received in culture bottles Performed at [facility], [address] [place], [place] [address]    Culture   Final    NO GROWTH 5 DAYS Performed at [facility], [place] [address], [place], [place] [address]    Report Status 07/04/2018 FINAL  Final  Blood Culture (routine x 2)     Status: None   Collection Time: 06/29/18  4:45 PM  Result Value Ref Range Status   Specimen Description   Final    BLOOD LEFT ARM Performed at [facility], [address] [place], [place] [address]    Special Requests   Final    BOTTLES DRAWN AEROBIC AND ANAEROBIC Blood Culture results may not be optimal due to an excessive volume of blood received in culture  bottles Performed at [facility], [address] [place], [place] [address]    Culture   Final    NO GROWTH 5 DAYS Performed at [facility], [place] [address], [place], [place] [address]    Report Status 07/04/2018 FINAL  Final  Urine Culture     Status: None   Collection Time: 06/29/18 10:16 PM  Result Value Ref Range Status   Specimen Description   Final    URINE, RANDOM Performed at [place] [address] [place], [place] [address]    Special Requests   Final    NONE Performed at [facility], [place] [address] [place], [place] [address]    Culture   Final    NO GROWTH Performed at [facility], [place] [address], [place], [place] [address]    Report Status 07/01/2018 FINAL  Final      Studies: Dg Abd Portable 1v  Result Date: 07/04/2018 CLINICAL DATA:  Follow up small bowel obstruction EXAM: PORTABLE ABDOMEN - 1 VIEW COMPARISON:  07/03/2018 FINDINGS: Previously seen nasogastric catheter is not well appreciated on this exam. Scattered large and small bowel gas is noted. A few mildly prominent loops of small bowel are noted relatively similar to that seen on the prior exam but improved from the study from 07/01/2018. No free air is seen. No bony abnormality is noted. IMPRESSION: Stable minimal dilation of small bowel. The overall appearance is improved however from the more previous exam of 07/01/2018 Electronically Signed   By: Inez Catalina M.D.   On: 07/04/2018 07:28    Scheduled Meds: . enoxaparin (LOVENOX) injection  40 mg Subcutaneous Q24H    Continuous Infusions: . sodium chloride 75 mL/hr at 07/04/18 1318  . cefTRIAXone (ROCEPHIN)  IV Stopped (07/03/18 2259)  . metronidazole 500 mg (07/04/18 0832)     LOS: 5 days     Kayleen Memos, MD [facility] Pager [phone]  If 7PM-7AM, please contact night-coverage [email or web address] Password [facility] 07/04/2018, 4:59 PM

## 2018-07-04 NOTE — Progress Notes (Signed)
Please update patient's caretaker and obtain consent for any procedure from  Marshell Garfinkel at [phone] and case worker (From [employer]) [place] at [phone].  They request that no information be given to the patient's family.

## 2018-07-04 NOTE — Evaluation (Signed)
Physical Therapy Evaluation Patient Details Name: Jordan Kennedy MRN: [id number] DOB: 1956-04-07 Today's Date: 07/04/2018   History of Present Illness  62 y.o. male with medical history significant of intellectual disability, schizophrenia, SBO, chronic constipation.  Patient presents to ED at [facility] with c/o abd pain and vomiting and admitted for SBO - vs paralytic ileus given history  Clinical Impression  Pt admitted with above diagnosis. Pt currently with functional limitations due to the deficits listed below (see PT Problem List).  Pt will benefit from skilled PT to increase their independence and safety with mobility to allow discharge to the venue listed below.   Pt assisted with ambulating in hallway and tolerated good distance.  Pt poor historian however per chart, from group home.  Pt reports using RW at baseline.     Follow Up Recommendations No PT follow up;Supervision for mobility/OOB(pt likely close to mobility baseline)    Equipment Recommendations  None recommended by PT    Recommendations for Other Services       Precautions / Restrictions Precautions Precautions: Fall Precaution Comments: NG tube      Mobility  Bed Mobility Overal bed mobility: Needs Assistance Bed Mobility: Supine to Sit;Sit to Supine     Supine to sit: Supervision Sit to supine: Supervision      Transfers Overall transfer level: Needs assistance Equipment used: Rolling walker (2 wheeled) Transfers: Sit to/from Stand Sit to Stand: Min assist         General transfer comment: assist to steady, control descent  Ambulation/Gait Ambulation/Gait assistance: Min assist Gait Distance (Feet): 400 Feet Assistive device: Rolling walker (2 wheeled) Gait Pattern/deviations: Step-through pattern;Narrow base of support;Decreased stride length     General Gait Details: increased bil toe out with small BOS, pt reports baseline ambulation, occasional assist for steadying  Stairs             Wheelchair Mobility    Modified Rankin (Stroke Patients Only)       Balance Overall balance assessment: Needs assistance         Standing balance support: Bilateral upper extremity supported Standing balance-Leahy Scale: Poor                               Pertinent Vitals/Pain Pain Assessment: No/denies pain    Home Living Family/patient expects to be discharged to:: Group home                      Prior Function Level of Independence: Independent with assistive device(s)         Comments: pt reports using a RW at baseline, ?accuracy     Hand Dominance        Extremity/Trunk Assessment        Lower Extremity Assessment Lower Extremity Assessment: Generalized weakness       Communication   Communication: No difficulties(little verbalizations)  Cognition Arousal/Alertness: Awake/alert Behavior During Therapy: Flat affect Overall Cognitive Status: History of cognitive impairments - at baseline                                 General Comments: likely at baseline, pleasant and follows commands      General Comments      Exercises     Assessment/Plan    PT Assessment Patient needs continued PT services  PT Problem List Decreased strength;Decreased balance;Decreased knowledge of  use of DME;Decreased mobility       PT Treatment Interventions DME instruction;Gait training;Therapeutic exercise;Therapeutic activities;Patient/family education;Functional mobility training;Balance training    PT Goals (Current goals can be found in the Care Plan section)  Acute Rehab PT Goals PT Goal Formulation: With patient Time For Goal Achievement: 07/18/18 Potential to Achieve Goals: Good    Frequency Min 2X/week   Barriers to discharge        Co-evaluation               AM-PAC PT "6 Clicks" Daily Activity  Outcome Measure Difficulty turning over in bed (including adjusting bedclothes, sheets and blankets)?:  None Difficulty moving from lying on back to sitting on the side of the bed? : None Difficulty sitting down on and standing up from a chair with arms (e.g., wheelchair, bedside commode, etc,.)?: A Little Help needed moving to and from a bed to chair (including a wheelchair)?: A Little Help needed walking in hospital room?: A Little Help needed climbing 3-5 steps with a railing? : A Little 6 Click Score: 20    End of Session Equipment Utilized During Treatment: Gait belt Activity Tolerance: Patient tolerated treatment well Patient left: in bed;with bed alarm set;with call bell/phone within reach Nurse Communication: Mobility status PT Visit Diagnosis: Other abnormalities of gait and mobility (R26.89)    Time: 4562-5638 PT Time Calculation (min) (ACUTE ONLY): 23 min   Charges:   PT Evaluation $PT Eval Low Complexity: [place], PT, DPT Acute Rehabilitation Services Office: [phone] Pager: [phone]   Trena Platt 07/04/2018, 3:13 PM

## 2018-07-05 NOTE — Progress Notes (Signed)
PROGRESS NOTE  Jordan Kennedy [id number]:[id number] DOB: 02-Jun-1956 DOA: 06/29/2018 PCP: [name], MD  Brief Narrative:  Jordan Kennedy a 62 y.o.malewith medical history significant ofintellectual disability, schizophrenia, SBO, chronic constipation. Patient presents to ED at [facility] with c/o abd pain and vomiting. No BM for past several days. Abdomen distended since he woke up this AM. Associated N/V. Also has dysuria.  He lives in a group home and his antipsychotics were reduced recently because it was thought to be causing him constipation.  And since admission he has not had any antipsychotic because he is on n.p.o. with NG tube continuous suctioning ED provider spoke with general surgery Dr. Kieth Brightly who advised to admit to medicine  Interval history/Subjective: C/o he  is hungry and wants to eat ED provider spoke with general surgery Dr. Kieth Brightly who advised to admit to medicine And since admission he has not had any antipsychotic because he is on n.p.o. with NG tube continuous suctioning He made a bowel movement today but he is NG tube is still putting out a significant amount  Assessment/Plan Principal Problem:   Schizophrenia ([place]) Active Problems:   Intellectual disability   Constipation   Small bowel obstruction (HCC)   AKI (acute kidney injury) ([place])   SIRS (systemic inflammatory response syndrome) ([place])    Small bowel obstruction patient has a bowel sounds and he reportedly had a bowel movement.  Still waiting to be seen by surgery.  I have reconsulted surgery.  He probably can remove the NG tube but I want to confirm with surgery taking ice chips now. Still on IV antibiotics will DC today  Schizophrenia he is antipsychotic medicines were held psychiatry was consulted who recommended that he can have his medications crushed.  Leukocytosis which is slightly worsened today is been persistent.  He received cefepime and vancomycin and Flagyl in the emergency  department he has been on Rocephin and Flagyl blood cultures are negative x2 we will repeat CBC in the morning   Code Status: Full  Family Communication: None at bedside  Disposition Plan: To group home his previous living arrangement   Consultants:  Dr. Lanice Shirts was consulted by ED physician   psychiatry Dr. Buford Dresser  Procedures:  NG tube  Physical therapy evaluation  Antimicrobials:  Ceftriaxone and Flagyl  DVT prophylaxis: Subcutaneous Lovenox Dr. Kyung Bacca [facility] Pager [phone]  07/05/2018, 11:18 AM  LOS: 6 days   Consultants:  Dr. Lanice Shirts was consulted by ED physician psychiatry Dr. Buford Dresser  Procedures:  None  Antimicrobials:  Rocephin and metronidazole   Objective: Vitals:  Vitals:   07/04/18 2224 07/05/18 0344  BP: (!) 145/78 (!) 161/99  Pulse:  78  Resp:  (!) 23  Temp:  98.6 F (37 C)  SpO2:  94%    Exam:  Constitutional:  . Appears calm and comfortable asking if he can eat or get up Eyes:  . pupils and irises appear normal . Normal lids and conjunctivae ENMT:  . grossly normal hearing  . Lips appear normal . external ears, nose appear normal . Oropharynx: mucosa, tongue,posterior pharynx appear normal NG tube is in place and draining continuous suction Neck:  . neck appears normal, no masses, normal ROM, supple . no thyromegaly Respiratory:  . CTA bilaterally, no w/r/r.  . Respiratory effort normal. No retractions or accessory muscle use Cardiovascular:  . RRR, no m/r/g . No LE extremity edema   . Normal pedal pulses Abdomen:  . Abdomen appears slightly distended no bowel sounds  heard. no tenderness or masses . No hernias . No HSM Musculoskeletal:  . Digits/nails BUE: no clubbing, cyanosis, petechiae, infection . exam of joints, bones, muscles of at least one of following: head/neck, RUE, LUE, RLE, LLE   o strength and tone normal, no atrophy, no abnormal movements o No tenderness,  masses o Normal ROM, no contractures  Skin:  . No rashes, lesions, ulcers . palpation of skin: no induration or nodules Neurologic:  . CN 2-12 intact . Sensation all 4 extremities intact Psychiatric:  . Mental status o Mood, affect appropriate he is calm o Orientation to person, place, time  . judgment and insight appear intellectually disabled. asking if he can eat and get up   I have personally reviewed the following:   Data:  .   Scheduled Meds: . enoxaparin (LOVENOX) injection  40 mg Subcutaneous Q24H   Continuous Infusions: . cefTRIAXone (ROCEPHIN)  IV 2 g (07/04/18 2307)  . dextrose 5 % and 0.45% NaCl 75 mL/hr at 07/05/18 0841  . metronidazole Stopped (07/05/18 0959)    Principal Problem:   Schizophrenia ([place]) Active Problems:   Intellectual disability   Constipation   Small bowel obstruction (HCC)   AKI (acute kidney injury) ([place])   SIRS (systemic inflammatory response syndrome) (HCC)   LOS: 6 days

## 2018-07-06 DIAGNOSIS — K5900 Constipation, unspecified: Secondary | ICD-10-CM

## 2018-07-06 DIAGNOSIS — K56609 Unspecified intestinal obstruction, unspecified as to partial versus complete obstruction: Secondary | ICD-10-CM

## 2018-07-06 DIAGNOSIS — F209 Schizophrenia, unspecified: Secondary | ICD-10-CM

## 2018-07-06 DIAGNOSIS — F79 Unspecified intellectual disabilities: Secondary | ICD-10-CM

## 2018-07-06 DIAGNOSIS — N179 Acute kidney failure, unspecified: Secondary | ICD-10-CM

## 2018-07-06 MED ORDER — POTASSIUM CHLORIDE 20 MEQ PO PACK
40.0000 meq | PACK | Freq: Once | ORAL | Status: AC
  Administered 2018-07-06: 40 meq via ORAL
  Filled 2018-07-06: qty 2

## 2018-07-06 MED ORDER — KCL IN DEXTROSE-NACL 20-5-0.45 MEQ/L-%-% IV SOLN
INTRAVENOUS | Status: DC
  Administered 2018-07-06 – 2018-07-07 (×3): via INTRAVENOUS
  Filled 2018-07-06 (×6): qty 1000

## 2018-07-06 NOTE — Progress Notes (Signed)
Patient stated he was ready to go back to his room and watch TV.  Telemetry placed and CMT notified.  IV fluids infusing.  Bed in lowest position with bed alarm activated.

## 2018-07-06 NOTE — Consult Note (Signed)
Re:   Jordan Kennedy DOB:   July 17, 1956 MRN:   [id number]  Chief Complaint Abdominal pain  ASSESEMENT AND PLAN: 1.  SBO vs poor GI motility  Admitted 06/30/2108  WBC - 16,500 - 07/04/2018  On Rocephin/Flagyl   Last KUB on 9/13 - stable minimal SB dilation  Has pulled NGT out - he is hungry.  Will not feed tonight.  Will update KUB in AM, change IVF's add K+, needs to ambulate more.   He needs to ambulate more.  He will probably need a more aggressive bowel regimen.  2.  Schizophrenia 3.  Chronic constipation 4.  Mental retardation  Lives in a group home 5.  Hypokalemia  K+ - 3.0 - 07/04/2018  Getting NS - will add K+ to IVF - labs ordered for AM. 6.  DVT prophylaxis - on Lovenox  Chief Complaint  Patient presents with  . Abdominal Pain   PHYSICIAN REQUESTING CONSULTATION: Pavelock, Ralene Bathe, MD  HISTORY OF PRESENT ILLNESS: Jordan Kennedy is a 62 y.o. (DOB: 1956/06/08)  AA male whose primary care physician is Pavelock, Ralene Bathe, MD.   He knows where he is and can answer questions, but is not a very good historian.  He was admitted on 06/29/2018 with abdominal pain and vomiting.  He has had no prior abdominal surgery.  He has been on IVF and has an NGT in and out (he keeps pulling it out).  Apparently someone spoke to Dr. Kieth Brightly on admission about consulting.  Dr. Kyung Bacca called me about this patient this evening.  He was seen by Dr. Redmond Pulling in 02/2017 for similar symptoms - felt to be more constipation the SBO.  He was also admitted in 04/25/2012 for constipation.  He was seen by PT on 07/04/2018 - but I am not sure how active he has been since he has been in the hospital.  The nurse says that he is a little unstable.   CT scan - 06/29/2018 - 1. The bowel obstruction in the right upper quadrant appears to be in the vicinity of the terminal ileum, about 8 cm from the ileocecal valve. A specific cause for the obstruction is not well seen; it is possible that there is an adhesion or that the  high position of the cecum is allowing for a twisting in the bowel. I am skeptical of a trans mesenteric internal hernia as the cause based on the overall appearance. Small bowel loops are dilated up to 6.3 cm and there is also distention of the stomach and fluid distention of the esophagus.  2. High density of urine in the urinary bladder, correlate with urine analysis in assessing for hematuria or proteinuria.  3. Moderate prostatomegaly.    Past Medical History:  Diagnosis Date  . Anemia   . Bowel obstruction ([place])   . BPH (benign prostatic hyperplasia)   . Chronic constipation   . History of ileus   . Mental retardation   . Partial bowel obstruction ([place])   . Schizophrenia ([place])    Monarche every three months;      History reviewed. No pertinent surgical history.    Current Facility-Administered Medications  Medication Dose Route Frequency Provider Last Rate Last Dose  . acetaminophen (TYLENOL) tablet 650 mg  650 mg Oral Q6H PRN Etta Quill, DO       Or  . acetaminophen (TYLENOL) suppository 650 mg  650 mg Rectal Q6H PRN Etta Quill, DO      . cefTRIAXone (ROCEPHIN)  2 g in sodium chloride 0.9 % 100 mL IVPB  2 g Intravenous QHS Jennette Kettle M, DO 200 mL/hr at 07/05/18 2300 2 g at 07/05/18 2300  . dextrose 5 %-0.45 % sodium chloride infusion   Intravenous Continuous Kayleen Memos, DO 75 mL/hr at 07/06/18 1731    . enoxaparin (LOVENOX) injection 40 mg  40 mg Subcutaneous Q24H Jennette Kettle M, DO   40 mg at 07/06/18 0945  . fentaNYL (SUBLIMAZE) injection 50 mcg  50 mcg Intravenous Q2H PRN Etta Quill, DO   50 mcg at 06/30/18 2004  . labetalol (NORMODYNE,TRANDATE) injection 10 mg  10 mg Intravenous Q6H PRN Irene Pap N, DO   10 mg at 07/05/18 0653  . metroNIDAZOLE (FLAGYL) IVPB 500 mg  500 mg Intravenous Q8H Lorin Glass, [place]   Stopped at 07/06/18 1759  . ondansetron (ZOFRAN) tablet 4 mg  4 mg Oral Q6H PRN Etta Quill, DO       Or  . ondansetron  [facility]) injection 4 mg  4 mg Intravenous Q6H PRN Etta Quill, DO   4 mg at 06/30/18 0449  . potassium chloride (KLOR-CON) packet 40 mEq  40 mEq Oral Once Cristal Deer, MD         No Known Allergies  REVIEW OF SYSTEMS: Cannot obtain much a ROS from the patient  Psycho-social:  Mentally retarded.  Schizophrenia.  Sees Dr. Buford Dresser for psych  SOCIAL and FAMILY HISTORY: Lives in group home Unsure of family situation.  PHYSICAL EXAM: BP (!) 157/93 Comment: rn aware  Pulse 70   Temp 98.6 F (37 C) (Oral)   Resp 20   Ht 6\' 2"  (1.88 m)   Wt 88.7 kg   SpO2 94%   BMI 25.11 kg/m   General: WN AA M who is alert.  He answers questions. Skin:  Inspection and palpation - no mass or rash. Eyes:  Conjunctiva and lids unremarkable.            Pupils are equal Ears, Nose, Mouth, and Throat:  Ears and nose unremarkable            Lips and teeth are unremarable. Neck: Supple. No mass, trachea midline.  No thyroid mass. Lymph Nodes:  No supraclavicular, cervical node Lungs: Normal respiratory effort.  Clear to auscultation and symmetric breath sounds. Heart:  Palpation of the heart is normal.            Auscultation: RRR. No murmur or rub.  Abdomen: Soft. No mass. No hernia.             He has BS and no localized tenderness. Rectal: Not done. Musculoskeletal:  Good muscle strength and ROM  in upper and lower extremities.  Neurologic:  Grossly intact to motor and sensory function. Psychiatric: Oriented to time, person, place, but he cannot give much of history  DATA REVIEWED, COUNSELING AND COORDINATION OF CARE: Epic notes reviewed. Counseling and coordination of care exceeded more than 50% of the time spent with patient. Total time spent with patient and charting: 45 minutes  Alphonsa Overall, MD,  [facility], [address]  [place], [place]    [place] Phone:  [phone] FAX:  [phone]

## 2018-07-06 NOTE — Progress Notes (Signed)
Patient pulled off telemetry, pulled out NG tube, and almost pulled out IV.  Patient refusing to stay in bed.  Patient stated he is hungry and wants to eat.  Patient denies pain and nausea.  Patient in recliner at nurses station.  MD notified.

## 2018-07-06 NOTE — Progress Notes (Addendum)
PROGRESS NOTE  Jordan Kennedy DUK:[id number] DOB: 1956-05-01 DOA: 06/29/2018 PCP: [name], MD  HPI/Recap of past 24 hours:  Jordan Kennedy a 62 y.o.malewith medical history significant ofintellectual disability, schizophrenia, SBO, chronic constipation. Patient presents to ED at [facility] with c/o abd pain and vomiting. No BM for past several days. Abdomen distended since he woke up this AM. Associated N/V. Also has dysuria.  He lives in a group home and his antipsychotics were reduced recently because it was thought to be causing him constipation.  And since admission he has not had any antipsychotic because he is on n.p.o. with NG tube continuous suctioning ED provider spoke with general surgery Dr. Kieth Brightly who advised to admit to medicine Subjective: Seen patient at bedside today.  His NG tube is still in place and draining but he had some ice chips.  He stated he feels better and wanted to know he can eat.  He has not been seen by general surgery.  I called general surgery again today.  Assessment/Plan: Principal Problem:   Schizophrenia ([place]) Active Problems:   Intellectual disability   Constipation   Small bowel obstruction (HCC)   AKI (acute kidney injury) ([place])   SIRS (systemic inflammatory response syndrome) (HCC)  Small bowel obstruction patient has a bowel sounds and he reportedly had a bowel movement.  Still waiting to be seen by surgery.  I have reconsulted surgery.  He probably can remove the NG tube but I want to confirm with surgery taking ice chips now. Still on IV antibiotics will DC today  Schizophrenia he is antipsychotic medicines were held psychiatry was consulted who recommended that he can have his medications crushed.  Leukocytosis which is slightly worsened today is been persistent.  He received cefepime and vancomycin and Flagyl in the emergency department he has been on Rocephin and Flagyl blood cultures are negative x2 we will repeat CBC in  the morning   Code Status: Full  Family Communication: None at bedside  Disposition Plan: To group home his previous living arrangement   Consultants:  Dr. Lanice Shirts was consulted by ED physician   psychiatry Dr. Buford Dresser  Procedures:  NG tube  Physical therapy evaluation  Antimicrobials:  Ceftriaxone and Flagyl  DVT prophylaxis: Subcutaneous Lovenox   Objective: Vitals:   07/06/18 1401 07/06/18 1405  BP: (!) 157/104 (!) 157/93  Pulse: 81 70  Resp: 20   Temp: 98.6 F (37 C)   SpO2: 94% 94%    Intake/Output Summary (Last 24 hours) at 07/06/2018 1633 Last data filed at 07/06/2018 1509 Gross per 24 hour  Intake 840 ml  Output 3525 ml  Net -2685 ml   Filed Weights   06/29/18 1613 06/30/18 0429  Weight: 90.7 kg 88.7 kg   Body mass index is 25.11 kg/m.  Exam:   General: Alert oriented x3 NG tube in place  Cardiovascular: Heart rate regular rate and rhythm with no murmur  Respiratory: No wheezing no rhonchi unlabored  Abdomen: Soft mildly distended nontender  Musculoskeletal: Moves all 4 extremities  Skin: Warm and dry no rashes  Psychiatry: Appropriate mood and affect   Data Reviewed: CBC: Recent Labs  Lab 06/30/18 0526 07/01/18 0538 07/02/18 0612 07/04/18 0521  WBC 16.5* 13.2* 12.0* 16.5*  HGB 14.0 12.3* 12.7* 13.8  HCT 43.2 38.7* 39.6 41.1  MCV 94.9 97.7 95.7 92.6  PLT 359 296 333 376   Basic Metabolic Panel: Recent Labs  Lab 06/30/18 0526 07/01/18 0538 07/04/18 0521  NA 145  145 140  K 3.9 3.8 3.0*  CL 109 112* 98  CO2 28 25 25   GLUCOSE 132* 121* 101*  BUN 26* 19 12  CREATININE 1.32* 1.06 0.92  CALCIUM 9.1 8.2* 8.8*  MG  --   --  2.0   GFR: Estimated Creatinine Clearance: 100.5 mL/min (by C-G formula based on SCr of 0.92 mg/dL). Liver Function Tests: Recent Labs  Lab 07/01/18 0538  AST 19  ALT 17  ALKPHOS 71  BILITOT 0.6  PROT 6.0*  ALBUMIN 3.2*   No results for input(s): LIPASE, AMYLASE in the last  168 hours. No results for input(s): AMMONIA in the last 168 hours. Coagulation Profile: No results for input(s): INR, PROTIME in the last 168 hours. Cardiac Enzymes: No results for input(s): CKTOTAL, CKMB, CKMBINDEX, TROPONINI in the last 168 hours. BNP (last 3 results) No results for input(s): PROBNP in the last 8760 hours. HbA1C: No results for input(s): HGBA1C in the last 72 hours. CBG: Recent Labs  Lab 07/02/18 2229  GLUCAP 111*   Lipid Profile: No results for input(s): CHOL, HDL, LDLCALC, TRIG, CHOLHDL, LDLDIRECT in the last 72 hours. Thyroid Function Tests: No results for input(s): TSH, T4TOTAL, FREET4, T3FREE, THYROIDAB in the last 72 hours. Anemia Panel: No results for input(s): VITAMINB12, FOLATE, FERRITIN, TIBC, IRON, RETICCTPCT in the last 72 hours. Urine analysis:    Component Value Date/Time   COLORURINE YELLOW 06/29/2018 2216   APPEARANCEUR HAZY (A) 06/29/2018 2216   LABSPEC 1.030 06/29/2018 2216   PHURINE 5.0 06/29/2018 2216   GLUCOSEU NEGATIVE 06/29/2018 2216   HGBUR MODERATE (A) 06/29/2018 2216   BILIRUBINUR NEGATIVE 06/29/2018 2216   BILIRUBINUR Negative 01/31/2015 1010   KETONESUR 5 (A) 06/29/2018 2216   PROTEINUR 30 (A) 06/29/2018 2216   UROBILINOGEN 0.2 01/31/2015 1010   UROBILINOGEN 1.0 06/12/2012 1315   NITRITE NEGATIVE 06/29/2018 2216   LEUKOCYTESUR NEGATIVE 06/29/2018 2216   Sepsis Labs: @LABRCNTIP (procalcitonin:4,lacticidven:4)  ) Recent Results (from the past 240 hour(s))  Blood Culture (routine x 2)     Status: None   Collection Time: 06/29/18  4:23 PM  Result Value Ref Range Status   Specimen Description   Final    BLOOD RIGHT ARM Performed at [facility], [address] [place], [place] [address]    Special Requests   Final    BOTTLES DRAWN AEROBIC AND ANAEROBIC Blood Culture results may not be optimal due to an excessive volume of blood received in culture bottles Performed at [facility], [address] [place], [place] [address]    Culture   Final    NO GROWTH 5 DAYS Performed at [facility], [place] [address] [place], [place] [address]    Report Status 07/04/2018 FINAL  Final  Blood Culture (routine x 2)     Status: None   Collection Time: 06/29/18  4:45 PM  Result Value Ref Range Status   Specimen Description   Final    BLOOD LEFT ARM Performed at [facility], [address] [place], [place] [address]    Special Requests   Final    BOTTLES DRAWN AEROBIC AND ANAEROBIC Blood Culture results may not be optimal due to an excessive volume of blood received in culture bottles Performed at [facility], [address] [place], [place] [address]    Culture   Final    NO GROWTH 5 DAYS Performed at [facility], [address] [place], [place] [address]    Report Status 07/04/2018 FINAL  Final  Urine Culture     Status: None   Collection Time: 06/29/18 10:16 PM  Result Value Ref Range Status   Specimen Description   Final    URINE, RANDOM Performed at [place] [address] [place], [place] [address]    Special Requests   Final    NONE Performed at [facility], [place] [address] [place], [place] [address]    Culture   Final    NO GROWTH Performed at [facility], [place] [address], [place], [place] [address]    Report Status 07/01/2018 FINAL  Final      Studies: No results found.  Scheduled Meds: . enoxaparin (LOVENOX) injection  40 mg Subcutaneous Q24H    Continuous Infusions: . cefTRIAXone (ROCEPHIN)  IV 2 g (07/05/18 2300)  . dextrose 5 % and 0.45% NaCl 75 mL/hr at 07/06/18 0116  . metronidazole Stopped (07/06/18 1015)     LOS: 7 days   July 06, 2018 1917 p.m.: Addendum patient pulled out NG tube until a box was starting to get agitated because he was hungry and wanting something to eat still have not been seen by surgery.  I will start him on clear liquids also will restart  his antipsychotics  Cristal Deer, MD [facility]  To reach me or the doctor on call, go to: [email or web address] Password [facility]  07/06/2018, 4:33 PM

## 2018-07-07 ENCOUNTER — Inpatient Hospital Stay (HOSPITAL_COMMUNITY): Payer: Medicare Other

## 2018-07-07 LAB — CBC WITH DIFFERENTIAL/PLATELET
BASOS ABS: 0 10*3/uL (ref 0.0–0.1)
BASOS PCT: 0 %
EOS ABS: 0.1 10*3/uL (ref 0.0–0.7)
EOS PCT: 1 %
HCT: 36.2 % — ABNORMAL LOW (ref 39.0–52.0)
Hemoglobin: 12.1 g/dL — ABNORMAL LOW (ref 13.0–17.0)
Lymphocytes Relative: 19 %
Lymphs Abs: 2.6 10*3/uL (ref 0.7–4.0)
MCH: 31.3 pg (ref 26.0–34.0)
MCHC: 33.4 g/dL (ref 30.0–36.0)
MCV: 93.5 fL (ref 78.0–100.0)
MONO ABS: 1.9 10*3/uL — AB (ref 0.1–1.0)
Monocytes Relative: 14 %
Neutro Abs: 9.2 10*3/uL — ABNORMAL HIGH (ref 1.7–7.7)
Neutrophils Relative %: 66 %
PLATELETS: 390 10*3/uL (ref 150–400)
RBC: 3.87 MIL/uL — AB (ref 4.22–5.81)
RDW: 15.1 % (ref 11.5–15.5)
WBC: 13.8 10*3/uL — AB (ref 4.0–10.5)

## 2018-07-07 LAB — COMPREHENSIVE METABOLIC PANEL
ALBUMIN: 2.9 g/dL — AB (ref 3.5–5.0)
ALT: 25 U/L (ref 0–44)
AST: 26 U/L (ref 15–41)
Alkaline Phosphatase: 62 U/L (ref 38–126)
Anion gap: 10 (ref 5–15)
BUN: 9 mg/dL (ref 6–20)
CO2: 27 mmol/L (ref 22–32)
Calcium: 8.1 mg/dL — ABNORMAL LOW (ref 8.9–10.3)
Chloride: 99 mmol/L (ref 98–111)
Creatinine, Ser: 0.98 mg/dL (ref 0.61–1.24)
GFR calc Af Amer: >60 mL/min (ref >60)
Glucose, Bld: 121 mg/dL — ABNORMAL HIGH (ref 70–99)
POTASSIUM: 2.9 mmol/L — AB (ref 3.5–5.1)
SODIUM: 136 mmol/L (ref 135–145)
Total Bilirubin: 0.6 mg/dL (ref 0.3–1.2)
Total Protein: 5.7 g/dL — ABNORMAL LOW (ref 6.5–8.1)

## 2018-07-07 LAB — MAGNESIUM: MAGNESIUM: 1.9 mg/dL (ref 1.7–2.4)

## 2018-07-07 MED ORDER — CLOZAPINE 100 MG PO TABS
100.0000 mg | ORAL_TABLET | Freq: Two times a day (BID) | ORAL | Status: DC
  Administered 2018-07-07 – 2018-07-09 (×5): 100 mg via ORAL
  Filled 2018-07-07 (×5): qty 1

## 2018-07-07 MED ORDER — MAGNESIUM SULFATE IN D5W 1-5 GM/100ML-% IV SOLN
1.0000 g | Freq: Once | INTRAVENOUS | Status: AC
  Administered 2018-07-07: 1 g via INTRAVENOUS
  Filled 2018-07-07: qty 100

## 2018-07-07 MED ORDER — MINERAL OIL RE ENEM
1.0000 | ENEMA | Freq: Once | RECTAL | Status: AC
  Administered 2018-07-07: 1 via RECTAL
  Filled 2018-07-07: qty 1

## 2018-07-07 MED ORDER — SORBITOL 70 % SOLN
960.0000 mL | TOPICAL_OIL | Freq: Once | ORAL | Status: DC
  Filled 2018-07-07: qty 473

## 2018-07-07 MED ORDER — PAROXETINE HCL 20 MG PO TABS
20.0000 mg | ORAL_TABLET | Freq: Every day | ORAL | Status: DC
  Administered 2018-07-07 – 2018-07-09 (×3): 20 mg via ORAL
  Filled 2018-07-07 (×3): qty 1

## 2018-07-07 MED ORDER — POTASSIUM CHLORIDE 10 MEQ/100ML IV SOLN: 10.0000 meq | INTRAVENOUS | Status: DC

## 2018-07-07 MED ORDER — AMANTADINE HCL 100 MG PO CAPS
100.0000 mg | ORAL_CAPSULE | Freq: Every day | ORAL | Status: DC
  Administered 2018-07-07 – 2018-07-08 (×2): 100 mg via ORAL
  Filled 2018-07-07 (×2): qty 1

## 2018-07-07 MED ORDER — POTASSIUM CHLORIDE 20 MEQ PO PACK
40.0000 meq | PACK | Freq: Two times a day (BID) | ORAL | Status: DC
  Administered 2018-07-07 (×2): 40 meq via ORAL
  Filled 2018-07-07 (×3): qty 2

## 2018-07-07 NOTE — Care Management Important Message (Signed)
Important Message  Patient Details  Name: Jordan Kennedy MRN: [id number] Date of Birth: 1956/01/23   Medicare Important Message Given:  Yes    Kerin Salen 07/07/2018, 11:08 AMImportant Message  Patient Details  Name: Jordan Kennedy MRN: [id number] Date of Birth: July 24, 1956   Medicare Important Message Given:  Yes    Kerin Salen 07/07/2018, 11:08 AM

## 2018-07-07 NOTE — Care Management Note (Signed)
Case Management Note  Patient Details  Name: Jordan Kennedy MRN: [id number] Date of Birth: [birth year]  Subjective/Objective:                  1.  SBO vs poor GI motility             Admitted 06/30/2108             WBC - 16,500 - 07/04/2018             On Rocephin/Flagyl              Last KUB on 9/13 - stable minimal SB dilation             Has pulled NGT out - he is hungry.  Will not feed tonight.  Will update KUB in AM, change IVF's add K+, needs to ambulate more.              He needs to ambulate more.  He will probably need a more aggressive bowel regimen.  2.  Schizophrenia 3.  Chronic constipation 4.  Mental retardation             Lives in a group home 5.  Hypokalemia             K+ - 3.0 - 07/04/2018             Getting NS - will add K+ to IVF - labs ordered for AM. 6.  DVT prophylaxis - on Lovenox   Action/Plan: Will follow for progression and cm needs/has a legal guardian.  Expected Discharge Date:                  Expected Discharge Plan:  Home/Self Care  In-House Referral:     Discharge planning Services  CM Consult  Post Acute Care Choice:    Choice offered to:     DME Arranged:    DME Agency:     HH Arranged:    HH Agency:     Status of Service:  In process, will continue to follow  If discussed at Long Length of Stay Meetings, dates discussed:    Additional Comments:  Leeroy Cha, RN 07/07/2018, 11:43 AM

## 2018-07-07 NOTE — Progress Notes (Signed)
Physical Therapy Treatment Patient Details Name: Jordan Kennedy MRN: [id number] DOB: July 31, 1956 Today's Date: 07/07/2018    History of Present Illness 62 y.o. male with medical history significant of intellectual disability, schizophrenia, SBO, chronic constipation.  Patient presents to ED at [facility] with c/o abd pain and vomiting and admitted for SBO - vs paralytic ileus given history    PT Comments    Pt progressing toward goals, continue PT POC   Follow Up Recommendations  No PT follow up;Supervision for mobility/OOB     Equipment Recommendations  None recommended by PT    Recommendations for Other Services       Precautions / Restrictions Precautions Precautions: Fall Restrictions Weight Bearing Restrictions: No    Mobility  Bed Mobility Overal bed mobility: Needs Assistance Bed Mobility: Supine to Sit     Supine to sit: Modified independent (Device/Increase time)        Transfers Overall transfer level: Needs assistance Equipment used: Rolling walker (2 wheeled) Transfers: Sit to/from Stand Sit to Stand: Min guard;Supervision         General transfer comment: cues for safe transitions  Ambulation/Gait Ambulation/Gait assistance: Supervision;Min guard Gait Distance (Feet): 410 Feet(10' without device, min/guard) Assistive device: Rolling walker (2 wheeled);None Gait Pattern/deviations: Step-through pattern;Decreased stride length;Drifts right/left     General Gait Details: narrow BOS at times, incr right foot supination, decr heel strike   Stairs             Wheelchair Mobility    Modified Rankin (Stroke Patients Only)       Balance                                            Cognition Arousal/Alertness: Awake/alert Behavior During Therapy: Flat affect Overall Cognitive Status: History of cognitive impairments - at baseline                                 General Comments: likely at baseline, pleasant  and follows commands      Exercises      General Comments        Pertinent Vitals/Pain Pain Assessment: No/denies pain    Home Living                      Prior Function            PT Goals (current goals can now be found in the care plan section) Acute Rehab PT Goals PT Goal Formulation: With patient Time For Goal Achievement: 07/18/18 Potential to Achieve Goals: Good Progress towards PT goals: Progressing toward goals    Frequency    Min 2X/week      PT Plan Current plan remains appropriate    Co-evaluation              AM-PAC PT "6 Clicks" Daily Activity  Outcome Measure  Difficulty turning over in bed (including adjusting bedclothes, sheets and blankets)?: None Difficulty moving from lying on back to sitting on the side of the bed? : None Difficulty sitting down on and standing up from a chair with arms (e.g., wheelchair, bedside commode, etc,.)?: A Little Help needed moving to and from a bed to chair (including a wheelchair)?: A Little Help needed walking in hospital room?: A Little Help needed climbing 3-5 steps with a  railing? : A Little 6 Click Score: 20    End of Session Equipment Utilized During Treatment: Gait belt Activity Tolerance: Patient tolerated treatment well Patient left: in chair;with call bell/phone within reach;with chair alarm set;with nursing/sitter in room Nurse Communication: Mobility status PT Visit Diagnosis: Other abnormalities of gait and mobility (R26.89)     Time: 1030-1049 PT Time Calculation (min) (ACUTE ONLY): 19 min  Charges:  $Gait Training: 8-22 mins                     Kenyon Ana, PT Pager: [phone] 07/07/2018   [facility] [phone]    [facility] 07/07/2018, 2:01 PM

## 2018-07-07 NOTE — Progress Notes (Addendum)
PROGRESS NOTE  [name] UXN:[id number] DOB: 04/22/1956 DOA: 06/29/2018 PCP: [name], MD  HPI/Recap of past 24 hours: Jordan Kennedy is a 62 y.o. male with medical history significant of intellectual disability, schizophrenia, SBO, chronic constipation.  Patient presents to ED at [facility] with c/o abd pain and vomiting.  No BM for past several days.  Abdomen distended since he woke up in the AM.  Associated N/V.  CT shows SBO with transition point at distal ileum. Mild prominence of stool in distal colon.  Admitted for small bowel obstruction.  Hospital course complicated by intermittent confusions with pulling at his NG tube multiple times.    07/07/2018: Patient seen and examined nights bedside.  No acute events overnight.  Denies any abdominal pain or nausea.  Mild abdominal sounds noted.  Started on clear liquid diet.    Assessment/Plan: Principal Problem:   Schizophrenia ([place]) Active Problems:   Intellectual disability   Constipation   Small bowel obstruction ([facility])   AKI (acute kidney injury) ([place])   SIRS (systemic inflammatory response syndrome) ([place])  1. SBO - vs paralytic ileus given history 1. Appears to be improving 2. Ambulate frequently 3. Started on clear liquid diet 4. Abdominal x-ray per surgery 5. Surgery following  2. AKI -resolved 1. Suspect pre-renal 2. Continue IV fluid hydration  3. Strict intake and output 4. Avoid nephrotoxic agents 5. Obtain BMP in the morning 3. SIRS - 1. Leukocytosis is persistent but trending down.  2. On IV antibiotics cefepime and Flagyl  3. Negative lactic acid and afebrile 4. Negative blood cultures x2 to date 5. Obtain CBC in the morning  4. Schizophrenia - 1. Resume psych medications  Delirium Appears to be resolved  Persistent hypokalemia Potassium 2.9 Repleted with IV potassium and p.o. potassium Repeat BMP in the morning  Resolved hypertension Not on antihypertensive medications at home Start IV  hydralazine 10 mg as needed every 6 hours for systolic blood pressure greater than 254 and diastolic greater than 270. Continue to monitor vital signs    Code Status: Full code  Family Communication: None at bedside  Disposition Plan: Back to group home in 1 to 2 days when surgery signed off.  Consultants:  General surgery consulted by EDP  Procedures:  None  Antimicrobials:  Rocephin  IV Flagyl  DVT prophylaxis: Subcu Lovenox   Objective: Vitals:   07/06/18 2025 07/06/18 2200 07/07/18 0434 07/07/18 1325  BP: (!) 171/87 138/88 (!) 157/95 135/84  Pulse: 74  69 65  Resp: 17  16 16   Temp: 98.1 F (36.7 C)  98.4 F (36.9 C) (!) 97.5 F (36.4 C)  TempSrc: Oral  Oral   SpO2: 95%  94% 96%  Weight:      Height:        Intake/Output Summary (Last 24 hours) at 07/07/2018 1358 Last data filed at 07/07/2018 1244 Gross per 24 hour  Intake 2671.01 ml  Output 1375 ml  Net 1296.01 ml   Filed Weights   06/29/18 1613 06/30/18 0429  Weight: 90.7 kg 88.7 kg    Exam:  . General: 62 y.o. year-old male well-developed well-nourished in no acute distress.  Alert in the setting of mental retardation. . Cardiovascular: Regular rate and rhythm with no rubs or gallops.  No thyromegaly or JVD noted. Marland Kitchen Respiratory: Clear to auscultation with no wheezes or rales. Good inspiratory effort. . Abdomen: Distended abdomen nontender with hypoactive bowel sounds. . Musculoskeletal: No lower extremity edema. 2/4 pulses in all 4  extremities.  Four-point restraints in place. Marland Kitchen Psychiatry: Mood is appropriate for condition and setting   Data Reviewed: CBC: Recent Labs  Lab 07/01/18 0538 07/02/18 0612 07/04/18 0521 07/07/18 0536  WBC 13.2* 12.0* 16.5* 13.8*  NEUTROABS  --   --   --  9.2*  HGB 12.3* 12.7* 13.8 12.1*  HCT 38.7* 39.6 41.1 36.2*  MCV 97.7 95.7 92.6 93.5  PLT 296 333 377 924   Basic Metabolic Panel: Recent Labs  Lab 07/01/18 0538 07/04/18 0521 07/07/18 0536  NA  145 140 136  K 3.8 3.0* 2.9*  CL 112* 98 99  CO2 25 25 27   GLUCOSE 121* 101* 121*  BUN 19 12 9   CREATININE 1.06 0.92 0.98  CALCIUM 8.2* 8.8* 8.1*  MG  --  2.0 1.9   GFR: Estimated Creatinine Clearance: 94.4 mL/min (by C-G formula based on SCr of 0.98 mg/dL). Liver Function Tests: Recent Labs  Lab 07/01/18 0538 07/07/18 0536  AST 19 26  ALT 17 25  ALKPHOS 71 62  BILITOT 0.6 0.6  PROT 6.0* 5.7*  ALBUMIN 3.2* 2.9*   No results for input(s): LIPASE, AMYLASE in the last 168 hours. No results for input(s): AMMONIA in the last 168 hours. Coagulation Profile: No results for input(s): INR, PROTIME in the last 168 hours. Cardiac Enzymes: No results for input(s): CKTOTAL, CKMB, CKMBINDEX, TROPONINI in the last 168 hours. BNP (last 3 results) No results for input(s): PROBNP in the last 8760 hours. HbA1C: No results for input(s): HGBA1C in the last 72 hours. CBG: Recent Labs  Lab 07/02/18 2229  GLUCAP 111*   Lipid Profile: No results for input(s): CHOL, HDL, LDLCALC, TRIG, CHOLHDL, LDLDIRECT in the last 72 hours. Thyroid Function Tests: No results for input(s): TSH, T4TOTAL, FREET4, T3FREE, THYROIDAB in the last 72 hours. Anemia Panel: No results for input(s): VITAMINB12, FOLATE, FERRITIN, TIBC, IRON, RETICCTPCT in the last 72 hours. Urine analysis:    Component Value Date/Time   COLORURINE YELLOW 06/29/2018 2216   APPEARANCEUR HAZY (A) 06/29/2018 2216   LABSPEC 1.030 06/29/2018 2216   PHURINE 5.0 06/29/2018 2216   GLUCOSEU NEGATIVE 06/29/2018 2216   HGBUR MODERATE (A) 06/29/2018 2216   BILIRUBINUR NEGATIVE 06/29/2018 2216   BILIRUBINUR Negative 01/31/2015 1010   KETONESUR 5 (A) 06/29/2018 2216   PROTEINUR 30 (A) 06/29/2018 2216   UROBILINOGEN 0.2 01/31/2015 1010   UROBILINOGEN 1.0 06/12/2012 1315   NITRITE NEGATIVE 06/29/2018 2216   LEUKOCYTESUR NEGATIVE 06/29/2018 2216   Sepsis Labs: @LABRCNTIP (procalcitonin:4,lacticidven:4)  ) Recent Results (from the past  240 hour(s))  Blood Culture (routine x 2)     Status: None   Collection Time: 06/29/18  4:23 PM  Result Value Ref Range Status   Specimen Description   Final    BLOOD RIGHT ARM Performed at [facility], [address] [place], [place] [address]    Special Requests   Final    BOTTLES DRAWN AEROBIC AND ANAEROBIC Blood Culture results may not be optimal due to an excessive volume of blood received in culture bottles Performed at [facility], [address] [place], [place] [address]    Culture   Final    NO GROWTH 5 DAYS Performed at [facility], [place] [address] [place], [place] [address]    Report Status 07/04/2018 FINAL  Final  Blood Culture (routine x 2)     Status: None   Collection Time: 06/29/18  4:45 PM  Result Value Ref  Range Status   Specimen Description   Final    BLOOD LEFT ARM Performed at [facility], [address] [place], [place] [address]    Special Requests   Final    BOTTLES DRAWN AEROBIC AND ANAEROBIC Blood Culture results may not be optimal due to an excessive volume of blood received in culture bottles Performed at [facility], [address] [place], [place] [address]    Culture   Final    NO GROWTH 5 DAYS Performed at [facility], [place] [address], [place], [place] [address]    Report Status 07/04/2018 FINAL  Final  Urine Culture     Status: None   Collection Time: 06/29/18 10:16 PM  Result Value Ref Range Status   Specimen Description   Final    URINE, RANDOM Performed at [facility] [address], [place], [place] [address]    Special Requests   Final    NONE Performed at [facility], [place] [address], [place], [place] [address]    Culture   Final    NO GROWTH Performed at [facility], [place] [address], [place], [place] [address]    Report Status 07/01/2018 FINAL  Final      Studies: Dg Abd 2 Views  Result Date: 07/07/2018 CLINICAL  DATA:  Abdominal pain EXAM: ABDOMEN - 2 VIEW COMPARISON:  07/04/2018 FINDINGS: Scattered large and small bowel gas is noted. A single dilated loop of small bowel is noted no free air is seen. No bony abnormality is noted. IMPRESSION: Single dilated loop of small bowel. This is relatively stable from the prior exam. Electronically Signed   By: Inez Catalina M.D.   On: 07/07/2018 13:16    Scheduled Meds: . cloZAPine  100 mg Oral BID  . enoxaparin (LOVENOX) injection  40 mg Subcutaneous Q24H  . mineral oil  1 enema Rectal Once  . PARoxetine  20 mg Oral Daily  . potassium chloride  40 mEq Oral BID    Continuous Infusions: . cefTRIAXone (ROCEPHIN)  IV 2 g (07/06/18 2050)  . dextrose 5 % and 0.45 % NaCl with KCl 20 mEq/L 100 mL/hr at 07/07/18 0500  . magnesium sulfate 1 - 4 g bolus IVPB    . metronidazole 500 mg (07/07/18 0814)     LOS: 8 days     Kayleen Memos, MD [facility] Pager [phone]  If 7PM-7AM, please contact night-coverage [email or web address] Password [email or web address] 07/07/2018, 1:58 PM

## 2018-07-07 NOTE — Progress Notes (Signed)
[facility] Progress Note     Subjective: CC:  Denies pain. States last BM was Friday. Unsure if having flatus. States he ate breakfast. No family at bedside. Worked with PT this AM.  Objective: Vital signs in last 24 hours: Temp:  [98.1 F (36.7 C)-98.6 F (37 C)] 98.4 F (36.9 C) (09/16 0434) Pulse Rate:  [69-81] 69 (09/16 0434) Resp:  [16-20] 16 (09/16 0434) BP: (138-171)/(87-104) 157/95 (09/16 0434) SpO2:  [94 %-95 %] 94 % (09/16 0434) Last BM Date: 07/04/18  Intake/Output from previous day: 09/15 0701 - 09/16 0700 In: 2131 [I.V.:1558.5; IV Piggyback:572.5] Out: 2275 [Urine:500; Emesis/NG output:1775] Intake/Output this shift: Total I/O In: 720 [P.O.:720] Out: -   PE: Gen:  Alert, NAD, pleasant Card:  Regular rate and rhythm, pedal pulses 2+ BL Pulm:  Normal effort, clear to auscultation bilaterally Abd: Soft, non-tender, mild distention, bowel sounds present  Skin: warm and dry, no rashes  Psych: A&Ox3   Lab Results:  Recent Labs    07/07/18 0536  WBC 13.8*  HGB 12.1*  HCT 36.2*  PLT 390   BMET Recent Labs    07/07/18 0536  NA 136  K 2.9*  CL 99  CO2 27  GLUCOSE 121*  BUN 9  CREATININE 0.98  CALCIUM 8.1*   PT/INR No results for input(s): LABPROT, INR in the last 72 hours. CMP     Component Value Date/Time   NA 136 07/07/2018 0536   K 2.9 (L) 07/07/2018 0536   CL 99 07/07/2018 0536   CO2 27 07/07/2018 0536   GLUCOSE 121 (H) 07/07/2018 0536   BUN 9 07/07/2018 0536   CREATININE 0.98 07/07/2018 0536   CREATININE 0.92 09/14/2015 1637   CALCIUM 8.1 (L) 07/07/2018 0536   PROT 5.7 (L) 07/07/2018 0536   ALBUMIN 2.9 (L) 07/07/2018 0536   AST 26 07/07/2018 0536   ALT 25 07/07/2018 0536   ALKPHOS 62 07/07/2018 0536   BILITOT 0.6 07/07/2018 0536   GFRNONAA >60 07/07/2018 0536   GFRNONAA >89 09/14/2015 1637   GFRAA >60 07/07/2018 0536   GFRAA >89 09/14/2015 1637   Lipase     Component Value Date/Time   LIPASE 27 06/29/2018  1623   Studies/Results: No results found.  Anti-infectives: Anti-infectives (From admission, onward)   Start     Dose/Rate Route Frequency Ordered Stop   06/30/18 0015  cefTRIAXone (ROCEPHIN) 2 g in sodium chloride 0.9 % 100 mL IVPB     2 g 200 mL/hr over 30 Minutes Intravenous Daily at bedtime 06/29/18 2138     06/29/18 1709  vancomycin (VANCOCIN) 1000 MG powder    Note to Pharmacy:  Chuck Hint   : cabinet override      06/29/18 1709 06/30/18 0514   06/29/18 1709  ceFEPIme (MAXIPIME) 2 g injection    Note to Pharmacy:  Chuck Hint   : cabinet override      06/29/18 1709 06/30/18 0514   06/29/18 1709  vancomycin (VANCOCIN) 500 MG powder    Note to Pharmacy:  Chuck Hint   : cabinet override      06/29/18 1709 06/30/18 0514   06/29/18 1700  vancomycin (VANCOCIN) 1,500 mg in sodium chloride 0.9 % 500 mL IVPB     1,500 mg 250 mL/hr over 120 Minutes Intravenous  Once 06/29/18 1630 06/29/18 2016   06/29/18 1630  ceFEPIme (MAXIPIME) 2 g in sodium chloride 0.9 % 100 mL IVPB     2 g 200 mL/hr over  30 Minutes Intravenous  Once 06/29/18 1621 06/29/18 1752   06/29/18 1630  metroNIDAZOLE (FLAGYL) IVPB 500 mg     500 mg 100 mL/hr over 60 Minutes Intravenous Every 8 hours 06/29/18 1621     06/29/18 1630  vancomycin (VANCOCIN) IVPB 1000 mg/200 mL premix  Status:  Discontinued     1,000 mg 200 mL/hr over 60 Minutes Intravenous  Once 06/29/18 1621 06/29/18 1630     Assessment/Plan  SBO vs poor GI motility   KUB has been stable to improved, some dilated loops, no air-fluid levels, retained stool present  Enema for constipation  If tolerates a diet patient would benefit from a bowel regimen  OOB/ambulate  FEN: clears; hypokalemia - replace. Try to keep potassium > 4 and Mg > 2.  ID: rocephin, flagyl VTE: SCD's, lovenox    LOS: 8 days    Obie Dredge, [facility] Pager: [phone]

## 2018-07-08 LAB — CBC
HCT: 34.6 % — ABNORMAL LOW (ref 39.0–52.0)
HEMOGLOBIN: 11.5 g/dL — AB (ref 13.0–17.0)
MCH: 31.3 pg (ref 26.0–34.0)
MCHC: 33.2 g/dL (ref 30.0–36.0)
MCV: 94.3 fL (ref 78.0–100.0)
Platelets: 405 10*3/uL — ABNORMAL HIGH (ref 150–400)
RBC: 3.67 MIL/uL — ABNORMAL LOW (ref 4.22–5.81)
RDW: 15.4 % (ref 11.5–15.5)
WBC: 11.1 10*3/uL — AB (ref 4.0–10.5)

## 2018-07-08 LAB — BASIC METABOLIC PANEL
ANION GAP: 8 (ref 5–15)
BUN: 5 mg/dL — ABNORMAL LOW (ref 6–20)
CALCIUM: 8.4 mg/dL — AB (ref 8.9–10.3)
CO2: 28 mmol/L (ref 22–32)
CREATININE: 1.01 mg/dL (ref 0.61–1.24)
Chloride: 109 mmol/L (ref 98–111)
GFR calc non Af Amer: >60 mL/min (ref >60)
Glucose, Bld: 131 mg/dL — ABNORMAL HIGH (ref 70–99)
Potassium: 3.2 mmol/L — ABNORMAL LOW (ref 3.5–5.1)
SODIUM: 145 mmol/L (ref 135–145)

## 2018-07-08 LAB — MAGNESIUM: MAGNESIUM: 2.1 mg/dL (ref 1.7–2.4)

## 2018-07-08 MED ORDER — BISACODYL 10 MG RE SUPP
10.0000 mg | Freq: Once | RECTAL | Status: AC
  Administered 2018-07-08: 10 mg via RECTAL
  Filled 2018-07-08: qty 1

## 2018-07-08 MED ORDER — POLYETHYLENE GLYCOL 3350 17 G PO PACK
17.0000 g | PACK | Freq: Every day | ORAL | Status: DC
  Administered 2018-07-08 – 2018-07-09 (×2): 17 g via ORAL
  Filled 2018-07-08 (×2): qty 1

## 2018-07-08 MED ORDER — POTASSIUM CHLORIDE CRYS ER 20 MEQ PO TBCR
40.0000 meq | EXTENDED_RELEASE_TABLET | Freq: Two times a day (BID) | ORAL | Status: AC
  Administered 2018-07-08 (×2): 40 meq via ORAL
  Filled 2018-07-08 (×2): qty 2

## 2018-07-08 MED ORDER — SENNOSIDES-DOCUSATE SODIUM 8.6-50 MG PO TABS
2.0000 | ORAL_TABLET | Freq: Two times a day (BID) | ORAL | Status: DC
  Administered 2018-07-08 (×2): 2 via ORAL
  Filled 2018-07-08 (×2): qty 2

## 2018-07-08 NOTE — Progress Notes (Signed)
Patient ID: Jordan Kennedy, male   DOB: [birth year], 62 y.o.   MRN: [id number]       Subjective: No c/o.  Tolerating CLD.  No nausea.  No BM with enema yesterday, but RN did state that he actually had 2 BMs on night shift over the weekend or late last week.  Objective: Vital signs in last 24 hours: Temp:  [97.5 F (36.4 C)-99.1 F (37.3 C)] 98.8 F (37.1 C) (09/17 0350) Pulse Rate:  [65-103] 103 (09/17 0350) Resp:  [16-20] 20 (09/17 0350) BP: (131-139)/(75-84) 139/75 (09/17 0350) SpO2:  [73 %-96 %] 94 % (09/17 0350) Last BM Date: 07/04/18  Intake/Output from previous day: 09/16 0701 - 09/17 0700 In: 1958.8 [P.O.:960; I.V.:810.7; IV Piggyback:188.1] Out: 2350 [Urine:2350] Intake/Output this shift: Total I/O In: 16 [P.O.:16] Out: 900 [Urine:900]  PE: Heart: regular, slightly tachy Lungs: CTAB Abd: soft, NT, ND, +BS  Lab Results:  Recent Labs    07/07/18 0536 07/08/18 0538  WBC 13.8* 11.1*  HGB 12.1* 11.5*  HCT 36.2* 34.6*  PLT 390 405*   BMET Recent Labs    07/07/18 0536 07/08/18 0538  NA 136 145  K 2.9* 3.2*  CL 99 109  CO2 27 28  GLUCOSE 121* 131*  BUN 9 5*  CREATININE 0.98 1.01  CALCIUM 8.1* 8.4*   PT/INR No results for input(s): LABPROT, INR in the last 72 hours. CMP     Component Value Date/Time   NA 145 07/08/2018 0538   K 3.2 (L) 07/08/2018 0538   CL 109 07/08/2018 0538   CO2 28 07/08/2018 0538   GLUCOSE 131 (H) 07/08/2018 0538   BUN 5 (L) 07/08/2018 0538   CREATININE 1.01 07/08/2018 0538   CREATININE 0.92 09/14/2015 1637   CALCIUM 8.4 (L) 07/08/2018 0538   PROT 5.7 (L) 07/07/2018 0536   ALBUMIN 2.9 (L) 07/07/2018 0536   AST 26 07/07/2018 0536   ALT 25 07/07/2018 0536   ALKPHOS 62 07/07/2018 0536   BILITOT 0.6 07/07/2018 0536   GFRNONAA >60 07/08/2018 0538   GFRNONAA >89 09/14/2015 1637   GFRAA >60 07/08/2018 0538   GFRAA >89 09/14/2015 1637   Lipase     Component Value Date/Time   LIPASE 27 06/29/2018 1623        Studies/Results: Dg Abd 2 Views  Result Date: 07/07/2018 CLINICAL DATA:  Abdominal pain EXAM: ABDOMEN - 2 VIEW COMPARISON:  07/04/2018 FINDINGS: Scattered large and small bowel gas is noted. A single dilated loop of small bowel is noted no free air is seen. No bony abnormality is noted. IMPRESSION: Single dilated loop of small bowel. This is relatively stable from the prior exam. Electronically Signed   By: Inez Catalina M.D.   On: 07/07/2018 13:16    Anti-infectives: Anti-infectives (From admission, onward)   Start     Dose/Rate Route Frequency Ordered Stop   06/30/18 0015  cefTRIAXone (ROCEPHIN) 2 g in sodium chloride 0.9 % 100 mL IVPB     2 g 200 mL/hr over 30 Minutes Intravenous Daily at bedtime 06/29/18 2138     06/29/18 1709  vancomycin (VANCOCIN) 1000 MG powder    Note to Pharmacy:  Chuck Hint   : cabinet override      06/29/18 1709 06/30/18 0514   06/29/18 1709  ceFEPIme (MAXIPIME) 2 g injection    Note to Pharmacy:  Chuck Hint   : cabinet override      06/29/18 1709 06/30/18 0514   06/29/18 1709  vancomycin (VANCOCIN) 500  MG powder    Note to Pharmacy:  Chuck Hint   : cabinet override      06/29/18 1709 06/30/18 0514   06/29/18 1700  vancomycin (VANCOCIN) 1,500 mg in sodium chloride 0.9 % 500 mL IVPB     1,500 mg 250 mL/hr over 120 Minutes Intravenous  Once 06/29/18 1630 06/29/18 2016   06/29/18 1630  ceFEPIme (MAXIPIME) 2 g in sodium chloride 0.9 % 100 mL IVPB     2 g 200 mL/hr over 30 Minutes Intravenous  Once 06/29/18 1621 06/29/18 1752   06/29/18 1630  metroNIDAZOLE (FLAGYL) IVPB 500 mg     500 mg 100 mL/hr over 60 Minutes Intravenous Every 8 hours 06/29/18 1621     06/29/18 1630  vancomycin (VANCOCIN) IVPB 1000 mg/200 mL premix  Status:  Discontinued     1,000 mg 200 mL/hr over 60 Minutes Intravenous  Once 06/29/18 1621 06/29/18 1630       Assessment/Plan SBO vs poor GI motility  -improving on clear liquids, adv to full liquids  today -agree with bowel regimen with daily miralax            FEN: fulls; hypokalemia - replace. Try to keep potassium > 4 and Mg > 2.  ID: rocephin, flagyl, This is not needed from surgical standpoint.  Please consider stopping these today. VTE: SCD's, lovenox   LOS: 9 days    Henreitta Cea , [facility] Surgery 07/08/2018, 10:09 AM Pager: [phone]

## 2018-07-08 NOTE — Progress Notes (Signed)
PROGRESS NOTE  Roberta Kelly TMH:[id number] DOB: 04-28-1956 DOA: 06/29/2018 PCP: [name], MD  HPI/Recap of past 24 hours: Lyndon Chapel is a 62 y.o. male with medical history significant of intellectual disability, schizophrenia, SBO, chronic constipation.  Patient presents to ED at [facility] with c/o abd pain and vomiting.  No BM for past several days.  Abdomen distended since he woke up in the AM.  Associated N/V.  CT shows SBO with transition point at distal ileum. Mild prominence of stool in distal colon.  Admitted for small bowel obstruction.  Hospital course complicated by intermittent confusions with pulling at his NG tube multiple times.    07/07/2018: Patient seen and examined nights bedside.  No acute events overnight.  Denies any abdominal pain or nausea.  Mild abdominal sounds noted.  Started on clear liquid diet.  07/08/2018: Patient seen and examined at his bedside.  No acute events overnight.  Denies nausea or abdominal pain.  Bowel sounds noted on exam.  Post surgery can advance to full liquid.      Assessment/Plan: Principal Problem:   Schizophrenia ([place]) Active Problems:   Intellectual disability   Constipation   Small bowel obstruction ([facility])   AKI (acute kidney injury) ([place])   SIRS (systemic inflammatory response syndrome) ([place])  1. SBO vs poor GI motility 1. Appears to be improving 2. Ambulate frequently 3. Advancing diet to full liquid 4. Abdominal x-ray per surgery 5. Surgery following  2. AKI -resolved 1. Suspect pre-renal 2. Continue IV fluid hydration on D5 half with KCl 20 mEq infusion at 100 cc/h 3. Strict intake and output 4. Avoid nephrotoxic agents 5. Obtain BMP in the morning 3. SIRS - 1. Leukocytosis has resolved 2. Completed course of IV antibiotics cefepime and Flagyl today 07/08/2018 3. Negative lactic acid and afebrile 4. Negative blood cultures x2 to date  4. Schizophrenia - 1. Continue psych medications  Delirium Appears to  be resolved  Persistent hypokalemia but improving Potassium 3.2 from 2.9 Repleted with IV potassium and p.o. potassium  Resolved hypertension Not on antihypertensive medications at home  IV hydralazine 10 mg as needed every 6 hours for systolic blood pressure greater than 921 and diastolic greater than 194. Continue to monitor vital signs    Code Status: Full code  Family Communication: None at bedside  Disposition Plan: Back to group home in 1 to 2 days when surgery signed off.  Consultants:  General surgery consulted by EDP  Procedures:  None  Antimicrobials:  Completed course of IV ceftriaxone and IV Flagyl  DVT prophylaxis: SCDs and subcu Lovenox   Objective: Vitals:   07/07/18 1325 07/07/18 1400 07/07/18 2000 07/08/18 0350  BP: 135/84  131/78 139/75  Pulse: 65  65 (!) 103  Resp: 16  19 20   Temp: (!) 97.5 F (36.4 C) 98.4 F (36.9 C) 99.1 F (37.3 C) 98.8 F (37.1 C)  TempSrc:  Oral    SpO2: 96%  (!) 73% 94%  Weight:      Height:        Intake/Output Summary (Last 24 hours) at 07/08/2018 1308 Last data filed at 07/08/2018 0846 Gross per 24 hour  Intake 1134.8 ml  Output 3000 ml  Net -1865.2 ml   Filed Weights   06/29/18 1613 06/30/18 0429  Weight: 90.7 kg 88.7 kg    Exam:  . General: 62 y.o. year-old male well-developed well-nourished in no acute distress.  Alert in the setting of MR . Cardiovascular: Regular rate and rhythm  with no rubs or gallops.  No thyromegaly or JVD noted.   Marland Kitchen Respiratory: Clear to auscultation with no wheezes or rales. Good inspiratory effort. . Abdomen: Soft nontender with normal bowel sounds x4 quadrant. . Musculoskeletal: No lower extremity edema. 2/4 pulses in all 4 extremities.  Four-point restraints in place. Marland Kitchen Psychiatry: Mood is appropriate for condition and setting   Data Reviewed: CBC: Recent Labs  Lab 07/02/18 0612 07/04/18 0521 07/07/18 0536 07/08/18 0538  WBC 12.0* 16.5* 13.8* 11.1*  NEUTROABS   --   --  9.2*  --   HGB 12.7* 13.8 12.1* 11.5*  HCT 39.6 41.1 36.2* 34.6*  MCV 95.7 92.6 93.5 94.3  PLT 333 377 390 355*   Basic Metabolic Panel: Recent Labs  Lab 07/04/18 0521 07/07/18 0536 07/08/18 0538  NA 140 136 145  K 3.0* 2.9* 3.2*  CL 98 99 109  CO2 25 27 28   GLUCOSE 101* 121* 131*  BUN 12 9 5*  CREATININE 0.92 0.98 1.01  CALCIUM 8.8* 8.1* 8.4*  MG 2.0 1.9 2.1   GFR: Estimated Creatinine Clearance: 91.6 mL/min (by C-G formula based on SCr of 1.01 mg/dL). Liver Function Tests: Recent Labs  Lab 07/07/18 0536  AST 26  ALT 25  ALKPHOS 62  BILITOT 0.6  PROT 5.7*  ALBUMIN 2.9*   No results for input(s): LIPASE, AMYLASE in the last 168 hours. No results for input(s): AMMONIA in the last 168 hours. Coagulation Profile: No results for input(s): INR, PROTIME in the last 168 hours. Cardiac Enzymes: No results for input(s): CKTOTAL, CKMB, CKMBINDEX, TROPONINI in the last 168 hours. BNP (last 3 results) No results for input(s): PROBNP in the last 8760 hours. HbA1C: No results for input(s): HGBA1C in the last 72 hours. CBG: Recent Labs  Lab 07/02/18 2229  GLUCAP 111*   Lipid Profile: No results for input(s): CHOL, HDL, LDLCALC, TRIG, CHOLHDL, LDLDIRECT in the last 72 hours. Thyroid Function Tests: No results for input(s): TSH, T4TOTAL, FREET4, T3FREE, THYROIDAB in the last 72 hours. Anemia Panel: No results for input(s): VITAMINB12, FOLATE, FERRITIN, TIBC, IRON, RETICCTPCT in the last 72 hours. Urine analysis:    Component Value Date/Time   COLORURINE YELLOW 06/29/2018 2216   APPEARANCEUR HAZY (A) 06/29/2018 2216   LABSPEC 1.030 06/29/2018 2216   PHURINE 5.0 06/29/2018 2216   GLUCOSEU NEGATIVE 06/29/2018 2216   HGBUR MODERATE (A) 06/29/2018 2216   BILIRUBINUR NEGATIVE 06/29/2018 2216   BILIRUBINUR Negative 01/31/2015 1010   KETONESUR 5 (A) 06/29/2018 2216   PROTEINUR 30 (A) 06/29/2018 2216   UROBILINOGEN 0.2 01/31/2015 1010   UROBILINOGEN 1.0  06/12/2012 1315   NITRITE NEGATIVE 06/29/2018 2216   LEUKOCYTESUR NEGATIVE 06/29/2018 2216   Sepsis Labs: @LABRCNTIP (procalcitonin:4,lacticidven:4)  ) Recent Results (from the past 240 hour(s))  Blood Culture (routine x 2)     Status: None   Collection Time: 06/29/18  4:23 PM  Result Value Ref Range Status   Specimen Description   Final    BLOOD RIGHT ARM Performed at [facility], [address] [place], [place] [address]    Special Requests   Final    BOTTLES DRAWN AEROBIC AND ANAEROBIC Blood Culture results may not be optimal due to an excessive volume of blood received in culture bottles Performed at [facility], [address] [place], [place] [address]    Culture   Final    NO GROWTH 5 DAYS Performed at [facility], [place]  [address] [place], [place] [address]    Report Status 07/04/2018 FINAL  Final  Blood Culture (routine x 2)     Status: None   Collection Time: 06/29/18  4:45 PM  Result Value Ref Range Status   Specimen Description   Final    BLOOD LEFT ARM Performed at [facility], [address] [place], [place] [address]    Special Requests   Final    BOTTLES DRAWN AEROBIC AND ANAEROBIC Blood Culture results may not be optimal due to an excessive volume of blood received in culture bottles Performed at [facility], [address] [place], [place] [address]    Culture   Final    NO GROWTH 5 DAYS Performed at [facility], [place] [address] [place], [place] [address]    Report Status 07/04/2018 FINAL  Final  Urine Culture     Status: None   Collection Time: 06/29/18 10:16 PM  Result Value Ref Range Status   Specimen Description   Final    URINE, RANDOM Performed at [facility] [address], [place], [place] [address]    Special Requests   Final    NONE Performed at [facility], [place] [address], [place], [place] [address]    Culture   Final    NO  GROWTH Performed at [facility], [place] [address], [place],  [address]    Report Status 07/01/2018 FINAL  Final      Studies: Dg Abd 2 Views  Result Date: 07/07/2018 CLINICAL DATA:  Abdominal pain EXAM: ABDOMEN - 2 VIEW COMPARISON:  07/04/2018 FINDINGS: Scattered large and small bowel gas is noted. A single dilated loop of small bowel is noted no free air is seen. No bony abnormality is noted. IMPRESSION: Single dilated loop of small bowel. This is relatively stable from the prior exam. Electronically Signed   By: Inez Catalina M.D.   On: 07/07/2018 13:16    Scheduled Meds: . amantadine  100 mg Oral QHS  . cloZAPine  100 mg Oral BID  . enoxaparin (LOVENOX) injection  40 mg Subcutaneous Q24H  . PARoxetine  20 mg Oral Daily  . polyethylene glycol  17 g Oral Daily  . potassium chloride  40 mEq Oral BID  . senna-docusate  2 tablet Oral BID    Continuous Infusions: . dextrose 5 % and 0.45 % NaCl with KCl 20 mEq/L 100 mL/hr at 07/07/18 1716     LOS: 9 days     Kayleen Memos, MD [facility] Pager [phone]  If 7PM-7AM, please contact night-coverage [email or web address] Password [email or web address] 07/08/2018, 1:08 PM

## 2018-07-09 LAB — BASIC METABOLIC PANEL
ANION GAP: 8 (ref 5–15)
BUN: <5 mg/dL — ABNORMAL LOW (ref 6–20)
CALCIUM: 8.8 mg/dL — AB (ref 8.9–10.3)
CHLORIDE: 111 mmol/L (ref 98–111)
CO2: 26 mmol/L (ref 22–32)
CREATININE: 0.97 mg/dL (ref 0.61–1.24)
GFR calc non Af Amer: >60 mL/min (ref >60)
Glucose, Bld: 122 mg/dL — ABNORMAL HIGH (ref 70–99)
Potassium: 4.1 mmol/L (ref 3.5–5.1)
SODIUM: 145 mmol/L (ref 135–145)

## 2018-07-09 MED ORDER — MELATONIN 5 MG PO TABS: 10.0000 mg | ORAL_TABLET | Freq: Every day | ORAL | Status: DC

## 2018-07-09 MED ORDER — PAROXETINE HCL 20 MG PO TABS: 40.0000 mg | ORAL_TABLET | Freq: Every evening | ORAL | Status: DC

## 2018-07-09 MED ORDER — AMANTADINE HCL 100 MG PO CAPS: 100.0000 mg | ORAL_CAPSULE | Freq: Every day | ORAL | Status: DC

## 2018-07-09 MED ORDER — ZOLPIDEM TARTRATE 10 MG PO TABS: 10.0000 mg | ORAL_TABLET | Freq: Every day | ORAL | Status: DC

## 2018-07-09 MED ORDER — POLYETHYLENE GLYCOL 3350 17 G PO PACK: 17.0000 g | PACK | Freq: Every day | ORAL | 0 refills | Status: DC

## 2018-07-09 MED ORDER — CLOZAPINE 100 MG PO TABS: 200.0000 mg | ORAL_TABLET | Freq: Two times a day (BID) | ORAL | Status: DC

## 2018-07-09 NOTE — Discharge Summary (Addendum)
Discharge Summary  [place] MVH:[id number] DOB: 1956-04-21  PCP: [name], MD  Admit date: 06/29/2018 Discharge date: 07/09/2018  Time spent: 25 minutes  Recommendations for Outpatient Follow-up:  1. Follow-up with your PCP 2. Follow-up with your psychiatrist 3. Take your medications as prescribed  Discharge Diagnoses:  Active Hospital Problems   Diagnosis Date Noted  . Schizophrenia ([place])   . Small bowel obstruction ([place]) 06/29/2018  . AKI (acute kidney injury) ([place]) 06/29/2018  . SIRS (systemic inflammatory response syndrome) ([place]) 06/29/2018  . Constipation 04/25/2012  . Intellectual disability     Resolved Hospital Problems  No resolved problems to display.    Discharge Condition: Stable  Diet recommendation: Resume previous diet  Vitals:   07/08/18 2027 07/09/18 0614  BP: (!) 153/90 (!) 171/84  Pulse: 89 100  Resp: 16 (!) 21  Temp: 100.1 F (37.8 C) 98.4 F (36.9 C)  SpO2: 96% 97%    History of present illness:  [place] a 62 y.o.malewith medical history significant ofintellectual disability, schizophrenia, SBO, chronic constipation. Patient presents to ED at [facility] with c/o abd pain and vomiting. No BM for past several days. Abdomen distended since he woke up in the AM. Associated N/V. CT shows SBO with transition point at distal ileum. Mild prominence of stool in distal colon.  Admitted for small bowel obstruction.  Hospital course complicated by intermittent confusions with pulling at his NG tube multiple times.    07/09/2018: Patient seen and examined at his bedside.  No acute events overnight.  Patient has had multiple bowel movements denies any nausea or abdominal pain.  Tolerating a solid diet well.  Walking without any difficulty.  He has no new complaints.  On the day of discharge, the patient was hemodynamically stable.  He will need to follow-up with his primary care provider and psychiatrist posthospitalization.       Hospital Course:  Principal Problem:   Schizophrenia [facility]) Active Problems:   Intellectual disability   Constipation   Small bowel obstruction ([facility])   AKI (acute kidney injury) ([place])   SIRS (systemic inflammatory response syndrome) ([place])  1. SBO vs poor GI motility 1. Resolved 2. Ambulate frequently 3. Tolerating solid diet well 4. Surgery signed off on 07/09/2018 5. Follow-up with your PCP  2. AKI -resolved 1. Suspect pre-renal 2. Avoid nephrotoxic agents/dehydration 3. Follow-up with your PCP  3. SIRS/Sepsis of unclear etiology  1. Leukocytosis has resolved 2. Completed course of 10 days IV antibiotics cefepime and Flagyl 07/08/2018  4. Schizophrenia - 1. Continue psych medications  Delirium-resolved  Resolved hypokalemia after repletion  Transient elevated blood pressure Suspect contributed by IV fluid administration None of any antihypertensive medications Follow-up with your PCP    Code Status: Full code   Consultants:  General surgery   Procedures:  None  Antimicrobials:  Completed course of IV ceftriaxone and IV Flagyl   Discharge Exam: BP (!) 171/84   Pulse 100   Temp 98.4 F (36.9 C) (Oral)   Resp (!) 21   Ht 6\' 2"  (1.88 m)   Wt 88.7 kg   SpO2 97%   BMI 25.11 kg/m  . General: 62 y.o. year-old male well developed well nourished in no acute distress.  Alert and oriented x3. . Cardiovascular: Regular rate and rhythm with no rubs or gallops.  No thyromegaly or JVD noted.   Marland Kitchen Respiratory: Clear to auscultation with no wheezes or rales. Good inspiratory effort. . Abdomen: Soft nontender nondistended with normal bowel sounds  x4 quadrants. . Musculoskeletal: No lower extremity edema. 2/4 pulses in all 4 extremities. . Skin: No ulcerative lesions noted or rashes, . Psychiatry: Mood is appropriate for condition and setting  Discharge Instructions You were cared for by a hospitalist during your hospital stay. If you have any  questions about your discharge medications or the care you received while you were in the hospital after you are discharged, you can call the unit and asked to speak with the hospitalist on call if the hospitalist that took care of you is not available. Once you are discharged, your primary care physician will handle any further medical issues. Please note that NO REFILLS for any discharge medications will be authorized once you are discharged, as it is imperative that you return to your primary care physician (or establish a relationship with a primary care physician if you do not have one) for your aftercare needs so that they can reassess your need for medications and monitor your lab values.   Allergies as of 07/09/2018   No Known Allergies     Medication List    STOP taking these medications   MYRBETRIQ 50 MG Tb24 tablet Generic drug:  mirabegron ER   senna-docusate 8.6-50 MG tablet Commonly known as:  Senokot-S   tamsulosin 0.4 MG Caps capsule Commonly known as:  FLOMAX     TAKE these medications   amantadine 100 MG capsule Commonly known as:  SYMMETREL Take 100 mg by mouth at bedtime.   aspirin 81 MG tablet Take 1 tablet (81 mg total) by mouth daily.   cloZAPine 100 MG tablet Commonly known as:  CLOZARIL Take 2 tablets (200 mg total) by mouth 2 (two) times daily.   LINZESS 290 MCG Caps capsule Generic drug:  linaclotide Take 290 mcg by mouth daily before breakfast.   Melatonin 10 MG Tabs Take 10 mg by mouth at bedtime.   metoCLOPramide 5 MG tablet Commonly known as:  REGLAN Take 5 mg by mouth 2 (two) times daily.   omeprazole 40 MG capsule Commonly known as:  PRILOSEC Take 40 mg by mouth every evening.   PARoxetine 40 MG tablet Commonly known as:  PAXIL Take 40 mg by mouth every evening.   polyethylene glycol packet Commonly known as:  MIRALAX / GLYCOLAX Take 17 g by mouth daily. Start taking on:  07/10/2018 What changed:    when to take this  reasons  to take this   terbinafine 250 MG tablet Commonly known as:  LAMISIL Take 250 mg by mouth at bedtime.   zolpidem 10 MG tablet Commonly known as:  AMBIEN Take 10 mg by mouth at bedtime.      No Known Allergies Follow-up Information    Pavelock, Ralene Bathe, MD. Call in 1 day(s).   Specialty:  Internal Medicine Why:  Please call for an appointment. Contact information: [address] [place] [address] [phone]            The results of significant diagnostics from this hospitalization (including imaging, microbiology, ancillary and laboratory) are listed below for reference.    Significant Diagnostic Studies: Ct Abdomen Pelvis Wo Contrast  Result Date: 06/29/2018 CLINICAL DATA:  Possible SBO, hx SBO, no bowel movement since Thursday, abdominal pain and distention EXAM: CT ABDOMEN AND PELVIS WITHOUT CONTRAST TECHNIQUE: Multidetector CT imaging of the abdomen and pelvis was performed following the standard protocol without IV contrast. COMPARISON:  03/07/2017 and acute abdomen series from 5/20 02/2017 FINDINGS: Lower chest: Mild  subsegmental atelectasis in both lower lobes. Fluid distention of the esophagus. Hepatobiliary: Unremarkable Pancreas: Borderline prominence of dorsal pancreatic duct in the pancreatic head. Otherwise unremarkable. Spleen: Unremarkable Adrenals/Urinary Tract: No stones are identified. No hydronephrosis. Normal renal contours. The density of urine in the urinary bladder is slightly higher than expected at 37 Hounsfield units. Stomach/Bowel: Prominent gastric distention and distention of nearly all of the small bowel. Transition point what appears to be distal ileum about 8 cm from the ileocecal valve in the vicinity of image 18/6 where there appears to be a transition from dilated to nondilated terminal ileum. The bowel loops are indistinct in this vicinity. There is some mild prominence of stool in the distal colon. Small bowel loops are dilated  up to about 6.3 cm. There is some low-level edema tracking along small bowel mesentery. Vascular/Lymphatic: There is no significant atherosclerotic calcification in the abdominal aorta. Reproductive: Moderate prostatomegaly. 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 IMPRESSION: 1. The bowel obstruction in the right upper quadrant appears to be in the vicinity of the terminal ileum, about 8 cm from the ileocecal valve. A specific cause for the obstruction is not well seen; it is possible that there is an adhesion or that the high position of the cecum is allowing for a twisting in the bowel. I am skeptical of a trans mesenteric internal hernia as the cause based on the overall appearance. Small bowel loops are dilated up to 6.3 cm and there is also distention of the stomach and fluid distention of the esophagus. 2. High density of urine in the urinary bladder, correlate with urine analysis in assessing for hematuria or proteinuria. 3. Moderate prostatomegaly. Electronically Signed   By: Van Clines M.D.   On: 06/29/2018 18:33   Dg Chest 2 View  Result Date: 06/29/2018 CLINICAL DATA:  Abdominal pain and distention. No bowel movement since Thursday EXAM: CHEST - 2 VIEW COMPARISON:  03/15/2017 radiograph FINDINGS: Low lung volumes are present, causing crowding of the pulmonary vasculature. Mild atelectasis of both lung bases. The lungs appear otherwise clear. Heart size within normal limits given the low lung volumes and AP projection. No pleural effusion. IMPRESSION: 1. Low lung volumes with mild bibasilar atelectasis. Electronically Signed   By: Van Clines M.D.   On: 06/29/2018 18:21   Dg Abd 1 View  Result Date: 07/01/2018 CLINICAL DATA:  Status post nasogastric tube placement. EXAM: ABDOMEN - 1 VIEW COMPARISON:  Supine abdominal radiograph of earlier today at 9:03 a.m. FINDINGS: The tip  of the endotracheal tube lies at the level of the GE junction. The proximal port is not visible and is presumably in the distal third of the esophagus. There remain loops of moderately distended gas-filled small bowel in throughout the abdomen. There is some gas in the right colon. There is stool in the rectum. IMPRESSION: Inadequate positioning of the esophagogastric tube. Advancement by at least 15 cm is recommended. Electronically Signed   By: David  Martinique M.D.   On: 07/01/2018 12:07   Dg Abd 1 View  Result Date: 06/30/2018 CLINICAL DATA:  NG tube placement. EXAM: ABDOMEN - 1 VIEW COMPARISON:  Radiographs and CT yesterday. FINDINGS: Tip and side port of the enteric tube below the diaphragm in the stomach. Persistent small bowel dilatation primarily in the right abdomen again seen. IMPRESSION: Tip and side port of the enteric tube below the diaphragm in the stomach. Persistent small bowel  obstruction. Electronically Signed   By: Keith Rake M.D.   On: 06/30/2018 01:32   Dg Abd 2 Views  Result Date: 07/07/2018 CLINICAL DATA:  Abdominal pain EXAM: ABDOMEN - 2 VIEW COMPARISON:  07/04/2018 FINDINGS: Scattered large and small bowel gas is noted. A single dilated loop of small bowel is noted no free air is seen. No bony abnormality is noted. IMPRESSION: Single dilated loop of small bowel. This is relatively stable from the prior exam. Electronically Signed   By: Inez Catalina M.D.   On: 07/07/2018 13:16   Dg Abd Portable 1v  Result Date: 07/04/2018 CLINICAL DATA:  Follow up small bowel obstruction EXAM: PORTABLE ABDOMEN - 1 VIEW COMPARISON:  07/03/2018 FINDINGS: Previously seen nasogastric catheter is not well appreciated on this exam. Scattered large and small bowel gas is noted. A few mildly prominent loops of small bowel are noted relatively similar to that seen on the prior exam but improved from the study from 07/01/2018. No free air is seen. No bony abnormality is noted. IMPRESSION: Stable minimal  dilation of small bowel. The overall appearance is improved however from the more previous exam of 07/01/2018 Electronically Signed   By: Inez Catalina M.D.   On: 07/04/2018 07:28   Dg Abd Portable 1v  Result Date: 07/03/2018 CLINICAL DATA:  NG tube placement EXAM: PORTABLE ABDOMEN - 1 VIEW COMPARISON:  KUB of 07/01/2018 FINDINGS: The tip of the NG tube is probably within the gastroesophageal junction and could be advanced 7 several cm. There may be minimal atelectasis at the left lung base. The bowel gas pattern is nonspecific. IMPRESSION: 1. Tip of NG tube probably within the gastroesophageal junction. Recommend advancing into the more distal stomach. 2. Probable mild atelectasis at the left lung base. Correlate clinically. Electronically Signed   By: Ivar Drape M.D.   On: 07/03/2018 10:10   Dg Abd Portable 1v  Result Date: 07/01/2018 CLINICAL DATA:  Follow-up small bowel obstruction EXAM: PORTABLE ABDOMEN - 1 VIEW COMPARISON:  Abdomen film of 06/30/2017 and CT abdomen pelvis of 06/29/2017 FINDINGS: There has been some improvement in the gaseous distention of small bowel now measuring 50 mm compared to a 88 mm on the prior image at the area of greatest distention. No colonic bowel gas is seen and this pattern is consistent with persistent small-bowel obstruction. No opaque calculi are seen. IMPRESSION: Some improvement in the degree of gaseous distention of small bowel although persistent small bowel obstruction is noted. No colonic bowel gas is seen. Electronically Signed   By: Ivar Drape M.D.   On: 07/01/2018 10:03   Dg Abd Portable 1v  Result Date: 06/29/2018 CLINICAL DATA:  Nasogastric tube placement, bowel obstruction EXAM: PORTABLE ABDOMEN - 1 VIEW COMPARISON:  Portable exam 1909 hours compared to CT abdomen and pelvis 06/29/2018 FINDINGS: Tip of nasogastric tube projects over the lateral LEFT mid abdomen, likely mid stomach. Dilated small bowel loops persist. Prominent stool in LEFT colon. Food  debris and air within stomach. Bibasilar pulmonary infiltrates. Osseous structures unremarkable. IMPRESSION: Nasogastric tube projects over mid stomach. Persistent dilatation of small bowel loops with note of prominent stool in LEFT colon. Electronically Signed   By: Lavonia Dana M.D.   On: 06/29/2018 19:57    Microbiology: Recent Results (from the past 240 hour(s))  Blood Culture (routine x 2)     Status: None   Collection Time: 06/29/18  4:23 PM  Result Value Ref Range Status   Specimen Description   Final  BLOOD RIGHT ARM Performed at [facility], [address] [place], [place] [address]    Special Requests   Final    BOTTLES DRAWN AEROBIC AND ANAEROBIC Blood Culture results may not be optimal due to an excessive volume of blood received in culture bottles Performed at [facility], [address] [place], [place] [address]    Culture   Final    NO GROWTH 5 DAYS Performed at [facility], [place] [address], [place], [place] [address]    Report Status 07/04/2018 FINAL  Final  Blood Culture (routine x 2)     Status: None   Collection Time: 06/29/18  4:45 PM  Result Value Ref Range Status   Specimen Description   Final    BLOOD LEFT ARM Performed at [facility], [address] [place], [place] [address]    Special Requests   Final    BOTTLES DRAWN AEROBIC AND ANAEROBIC Blood Culture results may not be optimal due to an excessive volume of blood received in culture bottles Performed at [facility], [address] [place], [place] [address]    Culture   Final    NO GROWTH 5 DAYS Performed at [facility], [place] [address] [place], [place] [address]    Report Status 07/04/2018 FINAL  Final  Urine Culture     Status: None   Collection Time: 06/29/18 10:16 PM  Result Value Ref Range Status   Specimen Description   Final    URINE, RANDOM Performed at [facility] [address], [place], [place]  [address]    Special Requests   Final    NONE Performed at [facility], [place] [address], [place], [place] [address]    Culture   Final    NO GROWTH Performed at [facility], [place] [address] [place], [place] [address]    Report Status 07/01/2018 FINAL  Final     Labs: Basic Metabolic Panel: Recent Labs  Lab 07/04/18 0521 07/07/18 0536 07/08/18 0538 07/09/18 0537  NA 140 136 145 145  K 3.0* 2.9* 3.2* 4.1  CL 98 99 109 111  CO2 25 27 28 26   GLUCOSE 101* 121* 131* 122*  BUN 12 9 5* <5*  CREATININE 0.92 0.98 1.01 0.97  CALCIUM 8.8* 8.1* 8.4* 8.8*  MG 2.0 1.9 2.1  --    Liver Function Tests: Recent Labs  Lab 07/07/18 0536  AST 26  ALT 25  ALKPHOS 62  BILITOT 0.6  PROT 5.7*  ALBUMIN 2.9*   No results for input(s): LIPASE, AMYLASE in the last 168 hours. No results for input(s): AMMONIA in the last 168 hours. CBC: Recent Labs  Lab 07/04/18 0521 07/07/18 0536 07/08/18 0538  WBC 16.5* 13.8* 11.1*  NEUTROABS  --  9.2*  --   HGB 13.8 12.1* 11.5*  HCT 41.1 36.2* 34.6*  MCV 92.6 93.5 94.3  PLT 377 390 405*   Cardiac Enzymes: No results for input(s): CKTOTAL, CKMB, CKMBINDEX, TROPONINI in the last 168 hours. BNP: BNP (last 3 results) No results for input(s): BNP in the last 8760 hours.  ProBNP (last 3 results) No results for input(s): PROBNP in the last 8760 hours.  CBG: Recent Labs  Lab 07/02/18 2229  GLUCAP 111*       Signed:  Kayleen Memos, MD [facility] 07/09/2018, 11:27 AM

## 2018-07-09 NOTE — Final Consult Note (Addendum)
Consultant Final Sign-Off Note    Assessment/Final recommendations  [place] is a 62 y.o. 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  Patient is improving and moving his bowels well.  He needs to stay on a bowel regimen such as daily miralax at discharge.  Adv to soft diet.  If he tolerates this, he can be DC home from our standpoint when medically stable.   Wound care (if applicable): none   Diet at discharge: low fiber diet   Activity at discharge: per primary team   Follow-up appointment:  PCP prn   Pending results:  Unresulted Labs (From admission, onward)   None       Medication recommendations:daily miralax   Other recommendations: no ABX therapy is needed from our standpoint    Thank you for allowing [place] to participate in the care of your patient!  Please consult [place] again if you have further needs for your patient.  Henreitta Cea 07/09/2018 9:14 AM    Subjective  Patient is sleeping hard.  He opens his eyes when I call his name, but doesn't fully wake up.  RN states he had multiple BMs yesterday and tolerated his full liquids.  No abdominal pain.   Objective  Vital signs in last 24 hours: Temp:  [98.4 F (36.9 C)-100.1 F (37.8 C)] 98.4 F (36.9 C) (09/18 0614) Pulse Rate:  [89-102] 100 (09/18 0614) Resp:  [16-21] 21 (09/18 0614) BP: (142-171)/(82-90) 171/84 (09/18 0614) SpO2:  [96 %-98 %] 97 % (09/18 1093)  PE: Abd: soft, NT, ND, +BS   Pertinent labs and Studies: Recent Labs    07/07/18 0536 07/08/18 0538  WBC 13.8* 11.1*  HGB 12.1* 11.5*  HCT 36.2* 34.6*   BMET Recent Labs    07/08/18 0538 07/09/18 0537  NA 145 145  K 3.2* 4.1  CL 109 111  CO2 28 26  GLUCOSE 131* 122*  BUN 5* <5*  CREATININE 1.01 0.97  CALCIUM 8.4* 8.8*   No results for input(s): LABURIN in the last 72 hours. Results for orders placed or performed during the hospital encounter of 06/29/18  Blood Culture (routine x 2)     Status: None   Collection Time:  06/29/18  4:23 PM  Result Value Ref Range Status   Specimen Description   Final    BLOOD RIGHT ARM Performed at [facility], [address] [place], [place] [address]    Special Requests   Final    BOTTLES DRAWN AEROBIC AND ANAEROBIC Blood Culture results may not be optimal due to an excessive volume of blood received in culture bottles Performed at [facility], [address] [place], [place] [address]    Culture   Final    NO GROWTH 5 DAYS Performed at [facility], [place] [address], [place], [place] [address]    Report Status 07/04/2018 FINAL  Final  Blood Culture (routine x 2)     Status: None   Collection Time: 06/29/18  4:45 PM  Result Value Ref Range Status   Specimen Description   Final    BLOOD LEFT ARM Performed at [facility], [address] [place], [place] [address]    Special Requests   Final    BOTTLES DRAWN AEROBIC AND ANAEROBIC Blood Culture results may not be optimal due to an excessive volume of blood received in culture bottles Performed at [facility], [address]  [place], [place] [address]    Culture   Final    NO GROWTH 5 DAYS Performed at [facility], [place] [address], [place], [place] [address]    Report Status 07/04/2018 FINAL  Final  Urine Culture     Status: None   Collection Time: 06/29/18 10:16 PM  Result Value Ref Range Status   Specimen Description   Final    URINE, RANDOM Performed at [facility] [address], [place], [place] [address]    Special Requests   Final    NONE Performed at [facility], [place] [address] [place], [place] [address]    Culture   Final    NO GROWTH Performed at [facility], [place] [address], [place], [place] [address]    Report Status 07/01/2018 FINAL  Final    Imaging: No results found.

## 2018-07-09 NOTE — Discharge Instructions (Signed)
Small Bowel Obstruction °A small bowel obstruction means that something is blocking the small bowel. The small bowel is also called the small intestine. It is the long tube that connects the stomach to the colon. An obstruction will stop food and fluids from passing through the small bowel. Treatment depends on what is causing the problem and how bad the problem is. °Follow these instructions at home: °· Get a lot of rest. °· Follow your diet as told by your doctor. You may need to: °? Only drink clear liquids until you start to get better. °? Avoid solid foods as told by your doctor. °· Take over-the-counter and prescription medicines only as told by your doctor. °· Keep all follow-up visits as told by your doctor. This is important. °Contact a doctor if: °· You have a fever. °· You have chills. °Get help right away if: °· You have pain or cramps that get worse. °· You throw up (vomit) blood. °· You have a feeling of being sick to your stomach (nausea) that does not go away. °· You cannot stop throwing up. °· You cannot drink fluids. °· You feel confused. °· You feel dry or thirsty (dehydrated). °· Your belly gets more bloated. °· You feel weak or you pass out (faint). °This information is not intended to replace advice given to you by your health care provider. Make sure you discuss any questions you have with your health care provider. °Document Released: 11/15/2004 Document Revised: 06/04/2016 Document Reviewed: 12/02/2014 °Elsevier Interactive Patient Education © 2018 [employer] ° °

## 2018-07-10 ENCOUNTER — Encounter (HOSPITAL_BASED_OUTPATIENT_CLINIC_OR_DEPARTMENT_OTHER): Payer: Self-pay | Admitting: Emergency Medicine

## 2018-07-10 ENCOUNTER — Emergency Department (HOSPITAL_BASED_OUTPATIENT_CLINIC_OR_DEPARTMENT_OTHER): Payer: Medicare Other

## 2018-07-10 ENCOUNTER — Inpatient Hospital Stay (HOSPITAL_BASED_OUTPATIENT_CLINIC_OR_DEPARTMENT_OTHER)
Admission: EM | Admit: 2018-07-10 | Discharge: 2018-07-12 | DRG: 389 | Disposition: A | Discharge status: Home / Self Care | Payer: Medicare Other | Attending: Internal Medicine | Admitting: Internal Medicine

## 2018-07-10 ENCOUNTER — Other Ambulatory Visit: Payer: Self-pay

## 2018-07-10 DIAGNOSIS — K30 Functional dyspepsia: Secondary | ICD-10-CM | POA: Diagnosis present

## 2018-07-10 DIAGNOSIS — N400 Enlarged prostate without lower urinary tract symptoms: Secondary | ICD-10-CM | POA: Diagnosis present

## 2018-07-10 DIAGNOSIS — Z79899 Other long term (current) drug therapy: Secondary | ICD-10-CM

## 2018-07-10 DIAGNOSIS — K56609 Unspecified intestinal obstruction, unspecified as to partial versus complete obstruction: Secondary | ICD-10-CM | POA: Diagnosis not present

## 2018-07-10 DIAGNOSIS — F17210 Nicotine dependence, cigarettes, uncomplicated: Secondary | ICD-10-CM | POA: Diagnosis present

## 2018-07-10 DIAGNOSIS — Z79890 Hormone replacement therapy (postmenopausal): Secondary | ICD-10-CM | POA: Diagnosis not present

## 2018-07-10 DIAGNOSIS — F32A Depression, unspecified: Secondary | ICD-10-CM | POA: Diagnosis present

## 2018-07-10 DIAGNOSIS — K56600 Partial intestinal obstruction, unspecified as to cause: Secondary | ICD-10-CM | POA: Diagnosis not present

## 2018-07-10 DIAGNOSIS — F79 Unspecified intellectual disabilities: Secondary | ICD-10-CM

## 2018-07-10 DIAGNOSIS — R6510 Systemic inflammatory response syndrome (SIRS) of non-infectious origin without acute organ dysfunction: Secondary | ICD-10-CM | POA: Diagnosis not present

## 2018-07-10 DIAGNOSIS — F419 Anxiety disorder, unspecified: Secondary | ICD-10-CM | POA: Diagnosis present

## 2018-07-10 DIAGNOSIS — K567 Ileus, unspecified: Secondary | ICD-10-CM | POA: Diagnosis present

## 2018-07-10 DIAGNOSIS — K5901 Slow transit constipation: Secondary | ICD-10-CM | POA: Diagnosis present

## 2018-07-10 DIAGNOSIS — K5900 Constipation, unspecified: Secondary | ICD-10-CM | POA: Diagnosis present

## 2018-07-10 DIAGNOSIS — F329 Major depressive disorder, single episode, unspecified: Secondary | ICD-10-CM | POA: Diagnosis present

## 2018-07-10 DIAGNOSIS — F209 Schizophrenia, unspecified: Secondary | ICD-10-CM | POA: Diagnosis present

## 2018-07-10 DIAGNOSIS — Z7982 Long term (current) use of aspirin: Secondary | ICD-10-CM | POA: Diagnosis not present

## 2018-07-10 DIAGNOSIS — K5651 Intestinal adhesions [bands], with partial obstruction: Secondary | ICD-10-CM | POA: Diagnosis present

## 2018-07-10 DIAGNOSIS — K3189 Other diseases of stomach and duodenum: Secondary | ICD-10-CM | POA: Diagnosis not present

## 2018-07-10 LAB — CBC WITH DIFFERENTIAL/PLATELET
Basophils Absolute: 0 10*3/uL (ref 0.0–0.1)
Basophils Relative: 0 %
EOS ABS: 0.1 10*3/uL (ref 0.0–0.7)
Eosinophils Relative: 1 %
HCT: 39.5 % (ref 39.0–52.0)
HEMOGLOBIN: 13 g/dL (ref 13.0–17.0)
LYMPHS ABS: 1.7 10*3/uL (ref 0.7–4.0)
Lymphocytes Relative: 12 %
MCH: 31.4 pg (ref 26.0–34.0)
MCHC: 32.9 g/dL (ref 30.0–36.0)
MCV: 95.4 fL (ref 78.0–100.0)
Monocytes Absolute: 1 10*3/uL (ref 0.1–1.0)
Monocytes Relative: 7 %
Neutro Abs: 11.7 10*3/uL — ABNORMAL HIGH (ref 1.7–7.7)
Neutrophils Relative %: 80 %
PLATELETS: 374 10*3/uL (ref 150–400)
RBC: 4.14 MIL/uL — ABNORMAL LOW (ref 4.22–5.81)
RDW: 16.6 % — ABNORMAL HIGH (ref 11.5–15.5)
WBC: 14.5 10*3/uL — ABNORMAL HIGH (ref 4.0–10.5)

## 2018-07-10 LAB — URINALYSIS, ROUTINE W REFLEX MICROSCOPIC
Bilirubin Urine: NEGATIVE
Glucose, UA: NEGATIVE mg/dL
Ketones, ur: NEGATIVE mg/dL
NITRITE: NEGATIVE
PROTEIN: NEGATIVE mg/dL
SPECIFIC GRAVITY, URINE: 1.01 (ref 1.005–1.030)
pH: 7 (ref 5.0–8.0)

## 2018-07-10 LAB — COMPREHENSIVE METABOLIC PANEL
ALBUMIN: 3.7 g/dL (ref 3.5–5.0)
ALK PHOS: 77 U/L (ref 38–126)
ALT: 25 U/L (ref 0–44)
AST: 28 U/L (ref 15–41)
Anion gap: 9 (ref 5–15)
CALCIUM: 9.2 mg/dL (ref 8.9–10.3)
CO2: 26 mmol/L (ref 22–32)
CREATININE: 1.11 mg/dL (ref 0.61–1.24)
Chloride: 109 mmol/L (ref 98–111)
GFR calc Af Amer: >60 mL/min (ref >60)
GFR calc non Af Amer: >60 mL/min (ref >60)
Glucose, Bld: 160 mg/dL — ABNORMAL HIGH (ref 70–99)
Potassium: 4.2 mmol/L (ref 3.5–5.1)
SODIUM: 144 mmol/L (ref 135–145)
Total Bilirubin: 0.1 mg/dL — ABNORMAL LOW (ref 0.3–1.2)
Total Protein: 6.7 g/dL (ref 6.5–8.1)

## 2018-07-10 LAB — URINALYSIS, MICROSCOPIC (REFLEX)

## 2018-07-10 LAB — LIPASE, BLOOD: Lipase: 33 U/L (ref 11–51)

## 2018-07-10 MED ORDER — ENOXAPARIN SODIUM 40 MG/0.4ML ~~LOC~~ SOLN
40.0000 mg | SUBCUTANEOUS | Status: DC
  Administered 2018-07-10 – 2018-07-11 (×2): 40 mg via SUBCUTANEOUS
  Filled 2018-07-10 (×2): qty 0.4

## 2018-07-10 MED ORDER — SENNOSIDES-DOCUSATE SODIUM 8.6-50 MG PO TABS
1.0000 | ORAL_TABLET | Freq: Two times a day (BID) | ORAL | Status: DC
  Administered 2018-07-10 – 2018-07-12 (×4): 1 via ORAL
  Filled 2018-07-10 (×4): qty 1

## 2018-07-10 MED ORDER — SODIUM CHLORIDE 0.9 % IV SOLN
INTRAVENOUS | Status: DC
  Administered 2018-07-10 (×2): via INTRAVENOUS

## 2018-07-10 MED ORDER — IOPAMIDOL (ISOVUE-300) INJECTION 61%
100.0000 mL | Freq: Once | INTRAVENOUS | Status: AC | PRN
  Administered 2018-07-10: 100 mL via INTRAVENOUS

## 2018-07-10 MED ORDER — METOCLOPRAMIDE HCL 5 MG PO TABS
5.0000 mg | ORAL_TABLET | Freq: Two times a day (BID) | ORAL | Status: DC
  Administered 2018-07-10 – 2018-07-12 (×4): 5 mg via ORAL
  Filled 2018-07-10 (×4): qty 1

## 2018-07-10 MED ORDER — LINACLOTIDE 145 MCG PO CAPS
290.0000 ug | ORAL_CAPSULE | Freq: Every day | ORAL | Status: DC
  Administered 2018-07-11 – 2018-07-12 (×2): 290 ug via ORAL
  Filled 2018-07-10 (×2): qty 2

## 2018-07-10 MED ORDER — POLYETHYLENE GLYCOL 3350 17 G PO PACK
17.0000 g | PACK | Freq: Every day | ORAL | Status: DC
  Administered 2018-07-11 – 2018-07-12 (×2): 17 g via ORAL
  Filled 2018-07-10 (×2): qty 1

## 2018-07-10 MED ORDER — ASPIRIN EC 81 MG PO TBEC
81.0000 mg | DELAYED_RELEASE_TABLET | Freq: Every day | ORAL | Status: DC
  Administered 2018-07-11 – 2018-07-12 (×2): 81 mg via ORAL
  Filled 2018-07-10 (×2): qty 1

## 2018-07-10 MED ORDER — AMANTADINE HCL 100 MG PO CAPS
100.0000 mg | ORAL_CAPSULE | Freq: Every day | ORAL | Status: DC
  Administered 2018-07-10 – 2018-07-11 (×2): 100 mg via ORAL
  Filled 2018-07-10 (×2): qty 1

## 2018-07-10 MED ORDER — TERBINAFINE HCL 250 MG PO TABS
250.0000 mg | ORAL_TABLET | Freq: Every day | ORAL | Status: DC
  Administered 2018-07-10 – 2018-07-11 (×2): 250 mg via ORAL
  Filled 2018-07-10 (×2): qty 1

## 2018-07-10 MED ORDER — CLOZAPINE 100 MG PO TABS
200.0000 mg | ORAL_TABLET | Freq: Two times a day (BID) | ORAL | Status: DC
  Administered 2018-07-10 – 2018-07-12 (×4): 200 mg via ORAL
  Filled 2018-07-10 (×4): qty 2

## 2018-07-10 MED ORDER — LACTATED RINGERS IV BOLUS
1000.0000 mL | Freq: Once | INTRAVENOUS | Status: AC
  Administered 2018-07-10: 1000 mL via INTRAVENOUS

## 2018-07-10 MED ORDER — ZOLPIDEM TARTRATE 10 MG PO TABS: 10.0000 mg | ORAL_TABLET | Freq: Every evening | ORAL | Status: DC | PRN

## 2018-07-10 MED ORDER — SODIUM CHLORIDE 0.9 % IV SOLN
Freq: Once | INTRAVENOUS | Status: AC
  Administered 2018-07-10: 12:00:00 via INTRAVENOUS

## 2018-07-10 MED ORDER — PAROXETINE HCL 20 MG PO TABS
40.0000 mg | ORAL_TABLET | Freq: Every evening | ORAL | Status: DC
  Administered 2018-07-10 – 2018-07-11 (×2): 40 mg via ORAL
  Filled 2018-07-10 (×2): qty 2

## 2018-07-10 MED ORDER — PANTOPRAZOLE SODIUM 40 MG PO TBEC
40.0000 mg | DELAYED_RELEASE_TABLET | Freq: Every day | ORAL | Status: DC
  Administered 2018-07-10 – 2018-07-12 (×3): 40 mg via ORAL
  Filled 2018-07-10 (×3): qty 1

## 2018-07-10 NOTE — H&P (Signed)
History and Physical    [name] EGB:[id number] DOB: 1956-06-03 DOA: 07/10/2018  I have briefly reviewed the patient's prior medical records in [place]  PCP: Pavelock, Ralene Bathe, MD  Patient coming from: group home   Chief Complaint: abdominal discomfort  HPI: Jordan Kennedy is a 62 y.o. male with medical history significant of intellectual disability, schizophrenia, chronic constipation and prior episodes of small bowel obstruction.  He was recently hospitalized and discharged on 9/18 with a partial SBO, when he presented with abdominal pain as well as vomiting and no bowel movements for several days.  He was discharged yesterday and was brought back to the emergency room today from the group home. Per ED MD report group home staff brought patient stated that he has had abdominal pain as well as has been constipated.  On my interview with the patient, he states that he had a good breakfast this morning without any abdominal discomfort, without nausea or vomiting.  He has been having bowel movements shortly after being admitted to the floor and on my exam he appears quite comfortable and asking to eat.  Patient currently denies any discomfort.  He is somewhat of a poor historian given underlying psych issues.  ED Course: In the ED his vital signs are stable, blood work shows slight leukocytosis but is otherwise unremarkable.  He underwent a CT scan of the abdomen and pelvis which showed partial small bowel obstruction with a transition point in the right hemipelvis likely due to adhesions.  General surgery was consulted and we are asked to admit  Review of Systems: As per HPI otherwise 10 point review of systems negative, however over all unreliable history  Past Medical History:  Diagnosis Date  . Anemia   . Bowel obstruction ([place])   . BPH (benign prostatic hyperplasia)   . Chronic constipation   . History of ileus   . Mental retardation   . Partial bowel obstruction ([place])     . Schizophrenia ([place])    Monarche every three months;     History reviewed. No pertinent surgical history.   reports that he has been smoking cigarettes. He has a 7.50 pack-year smoking history. He has never used smokeless tobacco. He reports that he does not drink alcohol or use drugs.  No Known Allergies  Family History  Problem Relation Age of Onset  . Cancer Mother        unknown type    Prior to Admission medications   Medication Sig Start Date End Date Taking? Authorizing Provider  amantadine (SYMMETREL) 100 MG capsule Take 100 mg by mouth at bedtime.     [provider]  aspirin 81 MG tablet Take 1 tablet (81 mg total) by mouth daily. 01/31/15   Wardell Honour, MD  cloZAPine (CLOZARIL) 100 MG tablet Take 2 tablets (200 mg total) by mouth 2 (two) times daily. 03/12/17   Eugenie Filler, MD  linaclotide Rolan Lipa) 290 MCG CAPS capsule Take 290 mcg by mouth daily before breakfast.    [provider]  Melatonin 10 MG TABS Take 10 mg by mouth at bedtime.     [provider]  metoCLOPramide (REGLAN) 5 MG tablet Take 5 mg by mouth 2 (two) times daily.    [provider]  omeprazole (PRILOSEC) 40 MG capsule Take 40 mg by mouth every evening.    [provider]  PARoxetine (PAXIL) 40 MG tablet Take 40 mg by mouth every evening.     [provider]  polyethylene glycol (MIRALAX / GLYCOLAX) packet Take 17 g by mouth daily. 07/10/18   Kayleen Memos, DO  terbinafine (LAMISIL) 250 MG tablet Take 250 mg by mouth at bedtime.     [provider]  zolpidem (AMBIEN) 10 MG tablet Take 10 mg by mouth at bedtime.     [provider]    Physical Exam: Vitals:   07/10/18 1130 07/10/18 1200 07/10/18 1230 07/10/18 1628  BP: (!) 162/104 (!) 136/103 (!) 141/125 131/86  Pulse: 98 96 97 96  Resp: 19 17 (!) 25 20  Temp:    98.4 F (36.9 C)  TempSrc:    Oral  SpO2: 96% 97% 98% 100%  Weight:      Height:          Constitutional: NAD, comfortable, somewhat fidgety Eyes: PERRL, lids and conjunctivae normal ENMT: Mucous membranes are moist.  Neck: normal, supple, no masses, no thyromegaly Respiratory: clear to auscultation bilaterally, no wheezing, no crackles. Normal respiratory effort. No accessory muscle use.  Cardiovascular: Regular rate and rhythm, no murmurs / rubs / gallops. No extremity edema. 2+ pedal pulses.  Abdomen: no tenderness, no masses palpated. Bowel sounds positive.  Musculoskeletal: no clubbing / cyanosis. Normal muscle tone.  Skin: no rashes, lesions, ulcers. No induration Neurologic: CN 2-12 grossly intact. Strength 5/5 in all 4.  Psychiatric: Normal judgment and insight. Alert and oriented x 3. Normal mood.   Labs on Admission: I have personally reviewed following labs and imaging studies  CBC: Recent Labs  Lab 07/04/18 0521 07/07/18 0536 07/08/18 0538 07/10/18 1001  WBC 16.5* 13.8* 11.1* 14.5*  NEUTROABS  --  9.2*  --  11.7*  HGB 13.8 12.1* 11.5* 13.0  HCT 41.1 36.2* 34.6* 39.5  MCV 92.6 93.5 94.3 95.4  PLT 377 390 405* 300   Basic Metabolic Panel: Recent Labs  Lab 07/04/18 0521 07/07/18 0536 07/08/18 0538 07/09/18 0537 07/10/18 1001  NA 140 136 145 145 144  K 3.0* 2.9* 3.2* 4.1 4.2  CL 98 99 109 111 109  CO2 25 27 28 26 26   GLUCOSE 101* 121* 131* 122* 160*  BUN 12 9 5* <5* <5*  CREATININE 0.92 0.98 1.01 0.97 1.11  CALCIUM 8.8* 8.1* 8.4* 8.8* 9.2  MG 2.0 1.9 2.1  --   --    GFR: Estimated Creatinine Clearance: 83.3 mL/min (by C-G formula based on SCr of 1.11 mg/dL). Liver Function Tests: Recent Labs  Lab 07/07/18 0536 07/10/18 1001  AST 26 28  ALT 25 25  ALKPHOS 62 77  BILITOT 0.6 0.1*  PROT 5.7* 6.7  ALBUMIN 2.9* 3.7   Recent Labs  Lab 07/10/18 1001  LIPASE 33   No results for input(s): AMMONIA in the last 168 hours. Coagulation Profile: No results for input(s): INR, PROTIME in the last 168 hours. Cardiac Enzymes: No results  for input(s): CKTOTAL, CKMB, CKMBINDEX, TROPONINI in the last 168 hours. BNP (last 3 results) No results for input(s): PROBNP in the last 8760 hours. HbA1C: No results for input(s): HGBA1C in the last 72 hours. CBG: No results for input(s): GLUCAP in the last 168 hours. Lipid Profile: No results for input(s): CHOL, HDL, LDLCALC, TRIG, CHOLHDL, LDLDIRECT in the last 72 hours. Thyroid Function Tests: No results for input(s): TSH, T4TOTAL, FREET4, T3FREE, THYROIDAB in the last 72 hours. Anemia Panel: No results for input(s): VITAMINB12, FOLATE, FERRITIN, TIBC, IRON, RETICCTPCT in the last 72 hours. Urine analysis:    Component Value Date/Time  COLORURINE YELLOW 07/10/2018 [place] 07/10/2018 1022   LABSPEC 1.010 07/10/2018 1022   PHURINE 7.0 07/10/2018 1022   GLUCOSEU NEGATIVE 07/10/2018 1022   HGBUR TRACE (A) 07/10/2018 1022   BILIRUBINUR NEGATIVE 07/10/2018 1022   BILIRUBINUR Negative 01/31/2015 1010   KETONESUR NEGATIVE 07/10/2018 1022   PROTEINUR NEGATIVE 07/10/2018 1022   UROBILINOGEN 0.2 01/31/2015 1010   UROBILINOGEN 1.0 06/12/2012 1315   NITRITE NEGATIVE 07/10/2018 1022   LEUKOCYTESUR TRACE (A) 07/10/2018 1022     Radiological Exams on Admission: Ct Abdomen Pelvis W Contrast  Result Date: 07/10/2018 CLINICAL DATA:  Abdominal distention, diaphoresis, history of chronic constipation and partial bowel obstruction, schizophrenia EXAM: CT ABDOMEN AND PELVIS WITH CONTRAST TECHNIQUE: Multidetector CT imaging of the abdomen and pelvis was performed using the standard protocol following bolus administration of intravenous contrast. CONTRAST:  132mL ISOVUE-300 IOPAMIDOL (ISOVUE-300) INJECTION 61% COMPARISON:  CT abdomen pelvis of 06/29/2018 FINDINGS: Lower chest: There is mild basilar atelectasis in both lung bases and there is a tiny right pleural effusion now present. Cardiomegaly is stable. Hepatobiliary: A small low-attenuation structure near the dome of the right  lobe of the liver probably represents a small hemangioma with a similar small low-attenuation process peripherally in the right lower lobe also most likely represent hemangioma on image 29 series 2. No other hepatic abnormality is seen. Gallbladder is somewhat contracted and no calcified gallstones are seen. Pancreas: The pancreas is stable with minimally prominent pancreatic duct but no evidence of edema is seen and no mass is noted. Spleen: The spleen is unremarkable. Adrenals/Urinary Tract: The pelvocaliceal systems are unremarkable. The ureters appear normal in caliber. The urinary bladder is not well distended but no obvious abnormality seen other than somewhat thick wall which could be due to a degree of bladder outlet obstruction. Stomach/Bowel: The stomach is minimally distended with fluid and air with no abnormality noted. There are distended loops of small bowel particularly in the left abdomen, but the distal ileum is well visualized with no dilatation. The change in caliber is difficult to ascertain but it may be in the mid right pelvis as seen on image 74 series 2. This is consistent with a low-grade partial small bowel obstruction possibly due to and adhesion. There is some feces scattered throughout the transverse and right colon but no colonic distention is seen. The distal and terminal ileum is unremarkable in caliber. The appendix appears normal. Vascular/Lymphatic: The abdominal aorta is normal in caliber with only mild abdominal aortic atherosclerosis present. No adenopathy is seen. Reproductive: The prostate is diffusely prominent measuring 4.3 x 5.4 cm possibly causing a degree of bladder outlet obstruction. Other: No abdominal wall hernia is seen. Musculoskeletal: The lumbar vertebrae are normal alignment with diffuse degenerative spurring. Intervertebral disc spaces appear relatively normal. The SI joints appear corticated. IMPRESSION: 1. Findings consistent with partial small bowel  obstruction with transition point possibly in the right to mid pelvis most likely due to adhesion. 2. Somewhat prominent prostate may cause a degree of bladder outlet obstruction. 3. Bibasilar linear atelectasis with small right pleural effusion. Electronically Signed   By: Ivar Drape M.D.   On: 07/10/2018 10:40    EKG: Independently reviewed. Sinus rhythm   Assessment/Plan Active Problems:   SBO (small bowel obstruction) (HCC)   pSBO -Patient admitted to the hospital with recurrent small bowel obstruction.  During my interview he denies any current issues, stated to me that he had a good breakfast without pain or nausea or vomiting. -  Shortly after admission he was being cleaned as having a bowel movement, he has good active bowel sounds, his abdominal exam is fairly benign and patient is asking to eat. -We will continue home medication, bowel regimen and allow clear liquid tonight -Repeat abdominal x-ray in the morning, discussed with general surgery they will see in the morning as well  Schizophrenia -Continue psych medications, group home staff was also concerned that this had been some changes in the psych medications within the last admission, however other than changes to his Myrbetriq, Colace and tamsulosin I do not see any significant changes to his chronic psychiatric regimen. -Psychiatry consulted and evaluated patient on 9/10  SIRS -Patient had leukocytosis and SIRS appearance when he was admitted last time and also on presentation now with increased WBC and tachycardia and tachypnea. Reportedly was breaking out in sweats at the group home this morning.  -He is status post 10 days of Ceftriaxone and Flagyl which were finished on 9/17.  All his microbiology was negative.  He has slight leukocytosis during this admission but clinically do not see any evidence of active infection, hold further antibiotics for now.  Repeat CBC in the morning   DVT prophylaxis: Lovenox  Code Status:  Full code  Family Communication: no family at bedside Disposition Plan: back to group home when stable Consults called: general surger      Admission status: Inpatient     At the time of admission, it appears that the appropriate admission status for this patient is INPATIENT. This is judged to be reasonable and necessary in order to provide the required high service intensity to ensure the patient's safety given the presenting symptoms, physical exam findings, and initial radiographic and laboratory data in the context of their chronic comorbidities. Current circumstances are SBO, and it is felt to place patient at high risk for further clinical deterioration threatening life, limb, or organ. Moreover, it is my clinical judgment that the patient will require inpatient hospital care spanning beyond 2 midnights from the point of admission and that early discharge would result in unnecessary risk of decompensation and readmission or threat to life, limb or bodily function.   Marzetta Board, MD [facility] Pager [phone]  If 7PM-7AM, please contact night-coverage [email or web address] Password [facility]  07/10/2018, 4:58 PM

## 2018-07-10 NOTE — ED Provider Notes (Signed)
[facility] EMERGENCY DEPARTMENT Provider Note   CSN: [id number] Arrival date & time: 07/10/18  0915     History   Chief Complaint Chief Complaint  Patient presents with  . Sweating    HPI Jordan Kennedy is a 62 y.o. male.  The history is provided by the patient.  Abdominal Pain   This is a recurrent problem. The current episode started more than 2 days ago. The problem occurs every several days. The problem has been gradually worsening. The pain is associated with eating. The pain is located in the generalized abdominal region. The quality of the pain is aching. The pain is at a severity of 2/10. The pain is mild. Associated symptoms include constipation. Pertinent negatives include anorexia, fever, belching, diarrhea, hematochezia, melena, nausea, vomiting, dysuria, hematuria, headaches and arthralgias. Nothing aggravates the symptoms. Nothing relieves the symptoms. Past workup does not include surgery. Past medical history comments: hx of constipation and ileus.    Past Medical History:  Diagnosis Date  . Anemia   . Bowel obstruction ([place])   . BPH (benign prostatic hyperplasia)   . Chronic constipation   . History of ileus   . Mental retardation   . Partial bowel obstruction ([place])   . Schizophrenia ([place])    Monarche every three months;     Patient Active Problem List   Diagnosis Date Noted  . SBO (small bowel obstruction) ([place]) 07/10/2018  . Small bowel obstruction ([place]) 06/29/2018  . AKI (acute kidney injury) ([place]) 06/29/2018  . SIRS (systemic inflammatory response syndrome) ([place]) 06/29/2018  . Abdominal distension   . Ileus ([place]) 03/07/2017  . Anemia, iron deficiency 09/18/2015  . Constipation 04/25/2012  . Leukocytosis, unspecified 04/25/2012  . Anxiety and depression 04/25/2012  . 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 Schizophrenia ([place])   . Intellectual disability     History reviewed. No pertinent surgical history.      Home Medications    Prior to  Admission medications   Medication Sig Start Date End Date Taking? Authorizing Provider  amantadine (SYMMETREL) 100 MG capsule Take 100 mg by mouth at bedtime.     [provider]  aspirin 81 MG tablet Take 1 tablet (81 mg total) by mouth daily. 01/31/15   Wardell Honour, MD  cloZAPine (CLOZARIL) 100 MG tablet Take 2 tablets (200 mg total) by mouth 2 (two) times daily. 03/12/17   Eugenie Filler, MD  linaclotide Rolan Lipa) 290 MCG CAPS capsule Take 290 mcg by mouth daily before breakfast.    [provider]  Melatonin 10 MG TABS Take 10 mg by mouth at bedtime.     [provider]  metoCLOPramide (REGLAN) 5 MG tablet Take 5 mg by mouth 2 (two) times daily.    [provider]  omeprazole (PRILOSEC) 40 MG capsule Take 40 mg by mouth every evening.    [provider]  PARoxetine (PAXIL) 40 MG tablet Take 40 mg by mouth every evening.     [provider]  polyethylene glycol (MIRALAX / GLYCOLAX) packet Take 17 g by mouth daily. 07/10/18   Kayleen Memos, DO  terbinafine (LAMISIL) 250 MG tablet Take 250 mg by mouth at bedtime.     [provider]  zolpidem (AMBIEN) 10 MG tablet Take 10 mg by mouth at bedtime.     [provider]    Family History Family History  Problem Relation Age of Onset  . Cancer Mother        unknown type  Social History Social History   Tobacco Use  . Smoking status: Current Every Day Smoker    Packs/day: 0.25    Years: 30.00    Pack years: 7.50    Types: Cigarettes  . Smokeless tobacco: Never Used  Substance Use Topics  . Alcohol use: No  . Drug use: No     Allergies   Patient has no known allergies.   Review of Systems Review of Systems  Constitutional: Negative for chills and fever.  HENT: Negative for ear pain and sore throat.   Eyes: Negative for pain and visual disturbance.  Respiratory: Negative for cough and shortness of breath.   Cardiovascular: Negative for chest pain  and palpitations.  Gastrointestinal: Positive for abdominal pain and constipation. Negative for anorexia, diarrhea, hematochezia, melena, nausea and vomiting.  Genitourinary: Negative for dysuria and hematuria.  Musculoskeletal: Negative for arthralgias and back pain.  Skin: Negative for color change and rash.  Neurological: Negative for seizures, syncope and headaches.  All other systems reviewed and are negative.    Physical Exam Updated Vital Signs BP (!) 141/125   Pulse 97   Temp 98.6 F (37 C) (Oral)   Resp (!) 25   Ht 6\' 2"  (1.88 m)   Wt 88.7 kg   SpO2 98%   BMI 25.11 kg/m   Physical Exam  Constitutional: He is oriented to person, place, and time. He appears well-developed and well-nourished.  HENT:  Head: Normocephalic and atraumatic.  Eyes: Pupils are equal, round, and reactive to light. Conjunctivae and EOM are normal.  Neck: Normal range of motion. Neck supple.  Cardiovascular: Regular rhythm, normal heart sounds and intact distal pulses. Tachycardia present.  No murmur heard. Pulmonary/Chest: Effort normal and breath sounds normal. No respiratory distress.  Abdominal: Soft. He exhibits distension. There is tenderness.  Musculoskeletal: Normal range of motion. He exhibits no edema.  Neurological: He is alert and oriented to person, place, and time.  Skin: Skin is warm and dry.  Psychiatric: He has a normal mood and affect.  Nursing note and vitals reviewed.    ED Treatments / Results  Labs (all labs ordered are listed, but only abnormal results are displayed) Labs Reviewed  COMPREHENSIVE METABOLIC PANEL - Abnormal; Notable for the following components:      Result Value   Glucose, Bld 160 (*)    BUN <5 (*)    Total Bilirubin 0.1 (*)    All other components within normal limits  CBC WITH DIFFERENTIAL/PLATELET - Abnormal; Notable for the following components:   WBC 14.5 (*)    RBC 4.14 (*)    RDW 16.6 (*)    Neutro Abs 11.7 (*)    All other components  within normal limits  URINALYSIS, ROUTINE W REFLEX MICROSCOPIC - Abnormal; Notable for the following components:   Hgb urine dipstick TRACE (*)    Leukocytes, UA TRACE (*)    All other components within normal limits  URINALYSIS, MICROSCOPIC (REFLEX) - Abnormal; Notable for the following components:   Bacteria, UA RARE (*)    All other components within normal limits  LIPASE, BLOOD    EKG None  Radiology Ct Abdomen Pelvis W Contrast  Result Date: 07/10/2018 CLINICAL DATA:  Abdominal distention, diaphoresis, history of chronic constipation and partial bowel obstruction, schizophrenia EXAM: CT ABDOMEN AND PELVIS WITH CONTRAST TECHNIQUE: Multidetector CT imaging of the abdomen and pelvis was performed using the standard protocol following bolus administration of intravenous contrast. CONTRAST:  147mL ISOVUE-300 IOPAMIDOL (ISOVUE-300) INJECTION 61%  COMPARISON:  CT abdomen pelvis of 06/29/2018 FINDINGS: Lower chest: There is mild basilar atelectasis in both lung bases and there is a tiny right pleural effusion now present. Cardiomegaly is stable. Hepatobiliary: A small low-attenuation structure near the dome of the right lobe of the liver probably represents a small hemangioma with a similar small low-attenuation process peripherally in the right lower lobe also most likely represent hemangioma on image 29 series 2. No other hepatic abnormality is seen. Gallbladder is somewhat contracted and no calcified gallstones are seen. Pancreas: The pancreas is stable with minimally prominent pancreatic duct but no evidence of edema is seen and no mass is noted. Spleen: The spleen is unremarkable. Adrenals/Urinary Tract: The pelvocaliceal systems are unremarkable. The ureters appear normal in caliber. The urinary bladder is not well distended but no obvious abnormality seen other than somewhat thick wall which could be due to a degree of bladder outlet obstruction. Stomach/Bowel: The stomach is minimally distended  with fluid and air with no abnormality noted. There are distended loops of small bowel particularly in the left abdomen, but the distal ileum is well visualized with no dilatation. The change in caliber is difficult to ascertain but it may be in the mid right pelvis as seen on image 74 series 2. This is consistent with a low-grade partial small bowel obstruction possibly due to and adhesion. There is some feces scattered throughout the transverse and right colon but no colonic distention is seen. The distal and terminal ileum is unremarkable in caliber. The appendix appears normal. Vascular/Lymphatic: The abdominal aorta is normal in caliber with only mild abdominal aortic atherosclerosis present. No adenopathy is seen. Reproductive: The prostate is diffusely prominent measuring 4.3 x 5.4 cm possibly causing a degree of bladder outlet obstruction. Other: No abdominal wall hernia is seen. Musculoskeletal: The lumbar vertebrae are normal alignment with diffuse degenerative spurring. Intervertebral disc spaces appear relatively normal. The SI joints appear corticated. IMPRESSION: 1. Findings consistent with partial small bowel obstruction with transition point possibly in the right to mid pelvis most likely due to adhesion. 2. Somewhat prominent prostate may cause a degree of bladder outlet obstruction. 3. Bibasilar linear atelectasis with small right pleural effusion. Electronically Signed   By: Ivar Drape M.D.   On: 07/10/2018 10:40    Procedures Procedures (including critical care time)  Medications Ordered in ED Medications  lactated ringers bolus 1,000 mL ( Intravenous Stopped 07/10/18 1127)  iopamidol (ISOVUE-300) 61 % injection 100 mL (100 mLs Intravenous Contrast Given 07/10/18 1003)  0.9 %  sodium chloride infusion ( Intravenous New Bag/Given 07/10/18 1132)     Initial Impression / Assessment and Plan / ED Course  I have reviewed the triage vital signs and the nursing notes.  Pertinent labs &  imaging results that were available during my care of the patient were reviewed by me and considered in my medical decision making (see chart for details).     Jordan Kennedy is a 62 year old male with history of partial small bowel obstruction, developmental delay who presents to the ED with abdominal pain.  Patient recently discharged after he had a small bowel obstruction.  Patient with unremarkable vitals except for tachycardia.  Patient was discharged yesterday and states that he was feeling better but has now increasing abdominal distention, pain has not had any vomiting today but has felt nauseous.  He states that he has not passed gas.  Has had some tiny bowel movements but appears to still struggle with some constipation.  Patient is tender diffusely on exam.  It appears to be mildly distended.  Otherwise he does not appear uncomfortable.  Patient with lab work drawn and will obtain CT abdomen and pelvis to rule out obstruction or other intra-abdominal process.  Patient given lactated Ringer bolus as he appears mildly dehydrated on exam.  Lab work shows mild leukocytosis but otherwise no significant anemia, electrolyte abnormality.  Patient with CT scan showing partial small bowel obstruction.  General surgery was consulted and recommend bowel regimen, n.p.o., hydration.  Patient admitted to [facility] long hospitalist for further care.  Did have some bowel pass prior to transfer to [facility] long.  However, likely needs to be on more of an aggressive bowel regimen, this could be a motility issue after discussion with general surgery and upon chart review.  Improving with IV hydration.  Transferred in stable condition.  This chart was dictated using voice recognition software.  Despite best efforts to proofread,  errors can occur which can change the documentation meaning.  Final Clinical Impressions(s) / ED Diagnoses   Final diagnoses:  Partial intestinal obstruction, unspecified cause [facility])    ED  Discharge Orders    None       Lennice Sites, DO 07/10/18 1402

## 2018-07-10 NOTE — ED Notes (Signed)
Report to Raquel Sarna, Therapist, sports at [facility])

## 2018-07-10 NOTE — ED Triage Notes (Signed)
Pt here with group home staff; staff sts pt was discharged from [facility] yesterday; pt is breaking out in sweats throughout the day; there has been some change in psych meds with this last admission

## 2018-07-10 NOTE — ED Notes (Signed)
Patient transported to CT 

## 2018-07-11 ENCOUNTER — Inpatient Hospital Stay (HOSPITAL_COMMUNITY): Payer: Medicare Other

## 2018-07-11 DIAGNOSIS — F419 Anxiety disorder, unspecified: Secondary | ICD-10-CM

## 2018-07-11 DIAGNOSIS — K567 Ileus, unspecified: Secondary | ICD-10-CM

## 2018-07-11 DIAGNOSIS — K5900 Constipation, unspecified: Secondary | ICD-10-CM

## 2018-07-11 DIAGNOSIS — F329 Major depressive disorder, single episode, unspecified: Secondary | ICD-10-CM

## 2018-07-11 DIAGNOSIS — F79 Unspecified intellectual disabilities: Secondary | ICD-10-CM

## 2018-07-11 DIAGNOSIS — K3189 Other diseases of stomach and duodenum: Secondary | ICD-10-CM

## 2018-07-11 LAB — BASIC METABOLIC PANEL
Anion gap: 7 (ref 5–15)
BUN: <5 mg/dL — ABNORMAL LOW (ref 6–20)
CHLORIDE: 110 mmol/L (ref 98–111)
CO2: 26 mmol/L (ref 22–32)
Calcium: 8.7 mg/dL — ABNORMAL LOW (ref 8.9–10.3)
Creatinine, Ser: 0.95 mg/dL (ref 0.61–1.24)
GFR calc non Af Amer: >60 mL/min (ref >60)
Glucose, Bld: 142 mg/dL — ABNORMAL HIGH (ref 70–99)
POTASSIUM: 3.9 mmol/L (ref 3.5–5.1)
Sodium: 143 mmol/L (ref 135–145)

## 2018-07-11 LAB — CBC
HEMATOCRIT: 37.5 % — AB (ref 39.0–52.0)
Hemoglobin: 12.2 g/dL — ABNORMAL LOW (ref 13.0–17.0)
MCH: 31.1 pg (ref 26.0–34.0)
MCHC: 32.5 g/dL (ref 30.0–36.0)
MCV: 95.7 fL (ref 78.0–100.0)
Platelets: 389 10*3/uL (ref 150–400)
RBC: 3.92 MIL/uL — AB (ref 4.22–5.81)
RDW: 16.6 % — ABNORMAL HIGH (ref 11.5–15.5)
WBC: 12.8 10*3/uL — AB (ref 4.0–10.5)

## 2018-07-11 MED ORDER — FLEET ENEMA 7-19 GM/118ML RE ENEM
1.0000 | ENEMA | Freq: Once | RECTAL | Status: AC
  Administered 2018-07-11: 1 via RECTAL
  Filled 2018-07-11: qty 1

## 2018-07-11 NOTE — Clinical Social Work Note (Addendum)
Clinical Social Work Assessment  Patient Details  Name: Jordan Kennedy MRN: [id number] Date of Birth: 12-07-1955  Date of referral:  07/11/18               Reason for consult:  Discharge Planning                Permission sought to share information with:  Facility Sport and exercise psychologist, Other, Guardian Permission granted to share information::     Name::        Agency::  [facility]   Relationship::  Group Home Supervisor- Ms. Marshell Garfinkel [phone]  Contact Information:  DSS legal Guardian [place]  [phone], [phone], [phone]   Housing/Transportation Living arrangements for the past 2 months:  [place] of Information:  Facility(Ms. Wynetta Emery) Patient Interpreter Needed:  None Criminal Activity/Legal Involvement Pertinent to Current Situation/Hospitalization:  No - Comment as needed Significant Relationships:  Community Support(DSS/Facility ) Lives with:  Facility Resident Do you feel safe going back to the place where you live?  Yes Need for family participation in patient care:  Yes   Care giving concerns:   No concerns presented.  CSW called DSS worker to inform her of patient discharge back to [facility] 9/21. CSW left voicemail.    Social Worker assessment / plan: Patient is a long term care resident at [facility]. CSW spoke with the Supervisor Ms. Wynetta Emery, they are agreeable for the patient to return. They do not need an FL2. She states if the patient can be discharge before 1:00 pm tomorrow, it will allow them to pick the patient up.   Plan: Group Home.   Employment status:  Disabled (Comment on whether or not currently receiving Disability) Insurance information:  Medicare, Medicaid In [place] PT Recommendations:  Not assessed at this time Information / Referral to community resources:   [employer]   Patient/Family's Response to care:  Agreeable.   Patient/Family's Understanding of and Emotional Response to  Diagnosis, Current Treatment, and Prognosis:  Patient group home staff are knowledgeable of patient.   Emotional Assessment Appearance:  Appears stated age Attitude/Demeanor/Rapport:    Affect (typically observed):  Calm Orientation:    Alcohol / Substance use:  Not Applicable Psych involvement (Current and /or in the community):  (Medication Adjustment )  Discharge Needs  Concerns to be addressed:  Discharge Planning Concerns Readmission within the last 30 days:  Yes Current discharge risk:  None Barriers to Discharge:  No Barriers Identified   Lia Hopping, LCSW 07/11/2018, 11:28 AM

## 2018-07-11 NOTE — Progress Notes (Signed)
PROGRESS NOTE    Jordan Kennedy  FBP:[id number] DOB: 1955/11/25 DOA: 07/10/2018 PCP: [name], MD   Brief Narrative:  62 year old with history of intellectual disability, schizophrenia, chronic constipation and previous small bowel obstruction was recently hospitalized here and was discharged on 9/18 with partial small bowel obstruction which was treated conservatively.  After returning to his group home he complained of more abdominal pain therefore returned back to the ER.  CT of the abdomen pelvis showed partial small bowel obstruction again therefore he was transferred to the hospital for further care.  There is concern the patient likely has GI dysmotility issues and constipation versus actual small bowel obstruction.   Assessment & Plan:   Active Problems:   Intellectual disability   Constipation   Anxiety and depression   Ileus (HCC)   SIRS (systemic inflammatory response syndrome) (HCC)   SBO (small bowel obstruction) (HCC)   Abdominal pain and nausea, improved Partial small bowel obstruction versus ileus/gastric dysmotility Chronic constipation - No surgical indication at this time as patient is feeling better.  Patient is already on Reglan and Linzess.  Need to follow-up outpatient with gastroenterology.  Suspect this is dysmotility issue versus actual obstruction. -We will slowly advance his diet today, will give him Fleet enema to help with constipation -In the meantime continue senna and MiraLAX.  Out of bed to chair.  History of schizophrenia - Continue Paxil, Lamisil, clozapine  Intellectual disability - Supportive care.   DVT prophylaxis: Lovenox Code Status: Full code Family Communication: None at bedside Disposition Plan: Maintain hospital stay for another 24 hours to ensure his bowel function has improved and able to tolerate oral diet without any issues.  Consultants:   General surgery  Procedures:   None  Antimicrobials:    None   Subjective: Patient is feeling a little better today and would like to eat something.  He is passing flatulence.  Difficult to understand his speech which is chronic.  Review of Systems Otherwise negative except as per HPI, including: General: Denies fever, chills, night sweats or unintended weight loss. Resp: Denies cough, wheezing, shortness of breath. Cardiac: Denies chest pain, palpitations, orthopnea, paroxysmal nocturnal dyspnea. GI: Denies abdominal pain, nausea, vomiting, diarrhea or constipation GU: Denies dysuria, frequency, hesitancy or incontinence MS: Denies muscle aches, joint pain or swelling Neuro: Denies headache, neurologic deficits (focal weakness, numbness, tingling), abnormal gait Psych: Denies anxiety, depression, SI/HI/AVH Skin: Denies new rashes or lesions ID: Denies sick contacts, exotic exposures, travel  Objective: Vitals:   07/10/18 1230 07/10/18 1628 07/10/18 1958 07/11/18 0449  BP: (!) 141/125 131/86 135/84 129/83  Pulse: 97 96 64 86  Resp: (!) 25 20 (!) 22 18  Temp:  98.4 F (36.9 C) 98.4 F (36.9 C) 98.3 F (36.8 C)  TempSrc:  Oral Oral   SpO2: 98% 100% 97% 97%  Weight:      Height:        Intake/Output Summary (Last 24 hours) at 07/11/2018 1209 Last data filed at 07/11/2018 1014 Gross per 24 hour  Intake 407.7 ml  Output 1727 ml  Net -1319.3 ml   Filed Weights   07/10/18 0932  Weight: 88.7 kg    Examination:  General exam: Appears calm and comfortable ; difficult to understand speech due to chronic issues but mentating well and answering appropriately. Respiratory system: Clear to auscultation. Respiratory effort normal. Cardiovascular system: S1 & S2 heard, RRR. No JVD, murmurs, rubs, gallops or clicks. No pedal edema. Gastrointestinal system: Abdomen is nondistended,  soft and nontender. No organomegaly or masses felt. Normal bowel sounds heard. Central nervous system: Alert and oriented. No focal neurological  deficits. Extremities: Symmetric 5 x 5 power. Skin: No rashes, lesions or ulcers Psychiatry: Judgement and insight appear normal. Mood & affect appropriate.     Data Reviewed:   CBC: Recent Labs  Lab 07/07/18 0536 07/08/18 0538 07/10/18 1001 07/11/18 0542  WBC 13.8* 11.1* 14.5* 12.8*  NEUTROABS 9.2*  --  11.7*  --   HGB 12.1* 11.5* 13.0 12.2*  HCT 36.2* 34.6* 39.5 37.5*  MCV 93.5 94.3 95.4 95.7  PLT 390 405* 374 128   Basic Metabolic Panel: Recent Labs  Lab 07/07/18 0536 07/08/18 0538 07/09/18 0537 07/10/18 1001 07/11/18 0542  NA 136 145 145 144 143  K 2.9* 3.2* 4.1 4.2 3.9  CL 99 109 111 109 110  CO2 27 28 26 26 26   GLUCOSE 121* 131* 122* 160* 142*  BUN 9 5* <5* <5* <5*  CREATININE 0.98 1.01 0.97 1.11 0.95  CALCIUM 8.1* 8.4* 8.8* 9.2 8.7*  MG 1.9 2.1  --   --   --    GFR: Estimated Creatinine Clearance: 97.3 mL/min (by C-G formula based on SCr of 0.95 mg/dL). Liver Function Tests: Recent Labs  Lab 07/07/18 0536 07/10/18 1001  AST 26 28  ALT 25 25  ALKPHOS 62 77  BILITOT 0.6 0.1*  PROT 5.7* 6.7  ALBUMIN 2.9* 3.7   Recent Labs  Lab 07/10/18 1001  LIPASE 33   No results for input(s): AMMONIA in the last 168 hours. Coagulation Profile: No results for input(s): INR, PROTIME in the last 168 hours. Cardiac Enzymes: No results for input(s): CKTOTAL, CKMB, CKMBINDEX, TROPONINI in the last 168 hours. BNP (last 3 results) No results for input(s): PROBNP in the last 8760 hours. HbA1C: No results for input(s): HGBA1C in the last 72 hours. CBG: No results for input(s): GLUCAP in the last 168 hours. Lipid Profile: No results for input(s): CHOL, HDL, LDLCALC, TRIG, CHOLHDL, LDLDIRECT in the last 72 hours. Thyroid Function Tests: No results for input(s): TSH, T4TOTAL, FREET4, T3FREE, THYROIDAB in the last 72 hours. Anemia Panel: No results for input(s): VITAMINB12, FOLATE, FERRITIN, TIBC, IRON, RETICCTPCT in the last 72 hours. Sepsis Labs: Recent Labs   Lab 07/04/18 1716 07/04/18 1952  LATICACIDVEN 0.8 0.8    No results found for this or any previous visit (from the past 240 hour(s)).       Radiology Studies: Ct Abdomen Pelvis W Contrast  Result Date: 07/10/2018 CLINICAL DATA:  Abdominal distention, diaphoresis, history of chronic constipation and partial bowel obstruction, schizophrenia EXAM: CT ABDOMEN AND PELVIS WITH CONTRAST TECHNIQUE: Multidetector CT imaging of the abdomen and pelvis was performed using the standard protocol following bolus administration of intravenous contrast. CONTRAST:  157mL ISOVUE-300 IOPAMIDOL (ISOVUE-300) INJECTION 61% COMPARISON:  CT abdomen pelvis of 06/29/2018 FINDINGS: Lower chest: There is mild basilar atelectasis in both lung bases and there is a tiny right pleural effusion now present. Cardiomegaly is stable. Hepatobiliary: A small low-attenuation structure near the dome of the right lobe of the liver probably represents a small hemangioma with a similar small low-attenuation process peripherally in the right lower lobe also most likely represent hemangioma on image 29 series 2. No other hepatic abnormality is seen. Gallbladder is somewhat contracted and no calcified gallstones are seen. Pancreas: The pancreas is stable with minimally prominent pancreatic duct but no evidence of edema is seen and no mass is noted. Spleen: The spleen is  unremarkable. Adrenals/Urinary Tract: The pelvocaliceal systems are unremarkable. The ureters appear normal in caliber. The urinary bladder is not well distended but no obvious abnormality seen other than somewhat thick wall which could be due to a degree of bladder outlet obstruction. Stomach/Bowel: The stomach is minimally distended with fluid and air with no abnormality noted. There are distended loops of small bowel particularly in the left abdomen, but the distal ileum is well visualized with no dilatation. The change in caliber is difficult to ascertain but it may be in the  mid right pelvis as seen on image 74 series 2. This is consistent with a low-grade partial small bowel obstruction possibly due to and adhesion. There is some feces scattered throughout the transverse and right colon but no colonic distention is seen. The distal and terminal ileum is unremarkable in caliber. The appendix appears normal. Vascular/Lymphatic: The abdominal aorta is normal in caliber with only mild abdominal aortic atherosclerosis present. No adenopathy is seen. Reproductive: The prostate is diffusely prominent measuring 4.3 x 5.4 cm possibly causing a degree of bladder outlet obstruction. Other: No abdominal wall hernia is seen. Musculoskeletal: The lumbar vertebrae are normal alignment with diffuse degenerative spurring. Intervertebral disc spaces appear relatively normal. The SI joints appear corticated. IMPRESSION: 1. Findings consistent with partial small bowel obstruction with transition point possibly in the right to mid pelvis most likely due to adhesion. 2. Somewhat prominent prostate may cause a degree of bladder outlet obstruction. 3. Bibasilar linear atelectasis with small right pleural effusion. Electronically Signed   By: Ivar Drape M.D.   On: 07/10/2018 10:40   Dg Abd Portable 2v  Result Date: 07/11/2018 CLINICAL DATA:  Follow up small bowel obstruction EXAM: PORTABLE ABDOMEN - 2 VIEW COMPARISON:  06/30/2018 FINDINGS: Scattered large and small bowel gas is noted. A few loops of mildly dilated small bowel are again identified similar to that seen on recent CT examination. No free air is noted. Mild degenerative changes of lumbar spine are noted. No other focal abnormality is seen. IMPRESSION: Mild stable small bowel dilatation.  No free air is noted. Electronically Signed   By: Inez Catalina M.D.   On: 07/11/2018 07:00        Scheduled Meds: . amantadine  100 mg Oral QHS  . aspirin EC  81 mg Oral Daily  . cloZAPine  200 mg Oral BID  . enoxaparin (LOVENOX) injection  40 mg  Subcutaneous Q24H  . linaclotide  290 mcg Oral QAC breakfast  . metoCLOPramide  5 mg Oral BID  . pantoprazole  40 mg Oral Daily  . PARoxetine  40 mg Oral QPM  . polyethylene glycol  17 g Oral Daily  . senna-docusate  1 tablet Oral BID  . terbinafine  250 mg Oral QHS   Continuous Infusions: . sodium chloride 75 mL/hr at 07/10/18 2322     LOS: 1 day   Time spent= 30 mins    Colt Martelle Arsenio Loader, MD [facility] Pager [phone]   If 7PM-7AM, please contact night-coverage [email or web address] Password [email or web address] 07/11/2018, 12:09 PM

## 2018-07-11 NOTE — Progress Notes (Signed)
Patient had six beats of Vtach. MD notified. No new orders received at this time. Patient in no acute distress. RN will continue to monitor.

## 2018-07-11 NOTE — Progress Notes (Signed)
[facility] Progress Note     Subjective: CC:  Pt unable to remember why he was taken to the [facility] ED. Denies abdominal pain or back pain.  Denies nausea or vomiting. +flatus. States he had a BM yesterday.  Objective: Vital signs in last 24 hours: Temp:  [98.3 F (36.8 C)-98.6 F (37 C)] 98.3 F (36.8 C) (09/20 0449) Pulse Rate:  [64-127] 86 (09/20 0449) Resp:  [17-30] 18 (09/20 0449) BP: (118-162)/(77-125) 129/83 (09/20 0449) SpO2:  [96 %-100 %] 97 % (09/20 0449) Weight:  [88.7 kg] 88.7 kg (09/19 0932) Last BM Date: 07/10/18  Intake/Output from previous day: 09/19 0701 - 09/20 0700 In: 1407.8 [P.O.:400; I.V.:7.7; IV Piggyback:1000.1] Out: 1177 [Urine:1175; Stool:2] Intake/Output this shift: No intake/output data recorded.  PE: Gen:  Alert, NAD, pleasant Card:  Regular rate and rhythm, pedal pulses 2+ BL Pulm:  Normal effort, clear to auscultation bilaterally Abd: Soft, non-tender, non-distended, bowel sounds present in all 4 quadrants, no organomegaly Skin: warm and dry, no rashes  Psych: A&Ox3   Lab Results:  Recent Labs    07/10/18 1001 07/11/18 0542  WBC 14.5* 12.8*  HGB 13.0 12.2*  HCT 39.5 37.5*  PLT 374 389   BMET Recent Labs    07/10/18 1001 07/11/18 0542  NA 144 143  K 4.2 3.9  CL 109 110  CO2 26 26  GLUCOSE 160* 142*  BUN <5* <5*  CREATININE 1.11 0.95  CALCIUM 9.2 8.7*   PT/INR No results for input(s): LABPROT, INR in the last 72 hours. CMP     Component Value Date/Time   NA 143 07/11/2018 0542   K 3.9 07/11/2018 0542   CL 110 07/11/2018 0542   CO2 26 07/11/2018 0542   GLUCOSE 142 (H) 07/11/2018 0542   BUN <5 (L) 07/11/2018 0542   CREATININE 0.95 07/11/2018 0542   CREATININE 0.92 09/14/2015 1637   CALCIUM 8.7 (L) 07/11/2018 0542   PROT 6.7 07/10/2018 1001   ALBUMIN 3.7 07/10/2018 1001   AST 28 07/10/2018 1001   ALT 25 07/10/2018 1001   ALKPHOS 77 07/10/2018 1001   BILITOT 0.1 (L) 07/10/2018 1001   GFRNONAA >60  07/11/2018 0542   GFRNONAA >89 09/14/2015 1637   GFRAA >60 07/11/2018 0542   GFRAA >89 09/14/2015 1637   Lipase     Component Value Date/Time   LIPASE 33 07/10/2018 1001       Studies/Results: Ct Abdomen Pelvis W Contrast  Result Date: 07/10/2018 CLINICAL DATA:  Abdominal distention, diaphoresis, history of chronic constipation and partial bowel obstruction, schizophrenia EXAM: CT ABDOMEN AND PELVIS WITH CONTRAST TECHNIQUE: Multidetector CT imaging of the abdomen and pelvis was performed using the standard protocol following bolus administration of intravenous contrast. CONTRAST:  117mL ISOVUE-300 IOPAMIDOL (ISOVUE-300) INJECTION 61% COMPARISON:  CT abdomen pelvis of 06/29/2018 FINDINGS: Lower chest: There is mild basilar atelectasis in both lung bases and there is a tiny right pleural effusion now present. Cardiomegaly is stable. Hepatobiliary: A small low-attenuation structure near the dome of the right lobe of the liver probably represents a small hemangioma with a similar small low-attenuation process peripherally in the right lower lobe also most likely represent hemangioma on image 29 series 2. No other hepatic abnormality is seen. Gallbladder is somewhat contracted and no calcified gallstones are seen. Pancreas: The pancreas is stable with minimally prominent pancreatic duct but no evidence of edema is seen and no mass is noted. Spleen: The spleen is unremarkable. Adrenals/Urinary Tract: The pelvocaliceal systems are unremarkable.  The ureters appear normal in caliber. The urinary bladder is not well distended but no obvious abnormality seen other than somewhat thick wall which could be due to a degree of bladder outlet obstruction. Stomach/Bowel: The stomach is minimally distended with fluid and air with no abnormality noted. There are distended loops of small bowel particularly in the left abdomen, but the distal ileum is well visualized with no dilatation. The change in caliber is difficult  to ascertain but it may be in the mid right pelvis as seen on image 74 series 2. This is consistent with a low-grade partial small bowel obstruction possibly due to and adhesion. There is some feces scattered throughout the transverse and right colon but no colonic distention is seen. The distal and terminal ileum is unremarkable in caliber. The appendix appears normal. Vascular/Lymphatic: The abdominal aorta is normal in caliber with only mild abdominal aortic atherosclerosis present. No adenopathy is seen. Reproductive: The prostate is diffusely prominent measuring 4.3 x 5.4 cm possibly causing a degree of bladder outlet obstruction. Other: No abdominal wall hernia is seen. Musculoskeletal: The lumbar vertebrae are normal alignment with diffuse degenerative spurring. Intervertebral disc spaces appear relatively normal. The SI joints appear corticated. IMPRESSION: 1. Findings consistent with partial small bowel obstruction with transition point possibly in the right to mid pelvis most likely due to adhesion. 2. Somewhat prominent prostate may cause a degree of bladder outlet obstruction. 3. Bibasilar linear atelectasis with small right pleural effusion. Electronically Signed   By: Ivar Drape M.D.   On: 07/10/2018 10:40   Dg Abd Portable 2v  Result Date: 07/11/2018 CLINICAL DATA:  Follow up small bowel obstruction EXAM: PORTABLE ABDOMEN - 2 VIEW COMPARISON:  06/30/2018 FINDINGS: Scattered large and small bowel gas is noted. A few loops of mildly dilated small bowel are again identified similar to that seen on recent CT examination. No free air is noted. Mild degenerative changes of lumbar spine are noted. No other focal abnormality is seen. IMPRESSION: Mild stable small bowel dilatation.  No free air is noted. Electronically Signed   By: Inez Catalina M.D.   On: 07/11/2018 07:00    Anti-infectives: Anti-infectives (From admission, onward)   Start     Dose/Rate Route Frequency Ordered Stop   07/10/18 2200   terbinafine (LAMISIL) tablet 250 mg     250 mg Oral Daily at bedtime 07/10/18 1639         Assessment/Plan Constipation Chronic GI dysmotility - advance diet as tolerated  - continue bowel regimen, agree with enema  - mobilize 5x daily  - no acute surgical needs, no clinical signs of obstruction    LOS: 1 day    Obie Dredge, [facility] Pager: [phone]

## 2018-07-12 DIAGNOSIS — K56609 Unspecified intestinal obstruction, unspecified as to partial versus complete obstruction: Secondary | ICD-10-CM

## 2018-07-12 NOTE — Discharge Summary (Signed)
Physician Discharge Summary  Jordan Kennedy WNU:[id number] DOB: 27-Jul-1956 DOA: 07/10/2018  PCP: [name], MD  Admit date: 07/10/2018 Discharge date: 07/12/2018  Admitted From: [facility] Disposition: [facility]  Recommendations for Outpatient Follow-up:  1. Follow up with PCP in 1-2 weeks 2. Please obtain BMP/CBC in one week your next doctors visit.  3. Would recommend follow-up with outpatient gastroenterology as soon as possible.  Discharge Condition: Stable CODE STATUS: Full code Diet recommendation: GI soft diet  Brief/Interim Summary: 62 year old with history of intellectual disability, schizophrenia, chronic constipation and previous small bowel obstruction was recently hospitalized here and was discharged on 9/18 with partial small bowel obstruction which was treated conservatively.  After returning to his group home he complained of more abdominal pain therefore returned back to the ER.  CT of the abdomen pelvis showed partial small bowel obstruction again therefore he was transferred to the hospital for further care.  There is concern the patient likely has GI dysmotility issues and constipation versus actual small bowel obstruction. Patient was evaluated by general surgery in the hospital and it was determined his issues are likely due to gastric motility issues for which patient is on Linzess and Reglan.  At this time patient is tolerating oral diet and has reached maximum benefit from an hospital stay.  We will discharge the patient today in stable condition.   Discharge Diagnoses:  Active Problems:   Intellectual disability   Constipation   Anxiety and depression   Ileus (HCC)   SIRS (systemic inflammatory response syndrome) (HCC)   SBO (small bowel obstruction) (HCC)   Abdominal pain and nausea, resolved Partial small bowel obstruction versus ileus/gastric dysmotility Chronic constipation - No surgical indication at this time as patient is  feeling better.  Patient is already on Reglan and Linzess.  Need to follow-up outpatient with gastroenterology.  Suspect this is dysmotility issue versus actual obstruction. -Tolerating oral diet without any issues.  Having bowel movement.  Advised to continue using bowel regimen at home. -Needs to follow-up outpatient with gastroenterology as soon as possible for further evaluation and management of gastric dysmotility.  History of schizophrenia - Continue Paxil, Lamisil, clozapine  Intellectual disability - Supportive care.  On Lovenox for DVT prophylaxis while here Patient is a full code.  Discharge Instructions   Allergies as of 07/12/2018   No Known Allergies     Medication List    TAKE these medications   amantadine 100 MG capsule Commonly known as:  SYMMETREL Take 100 mg by mouth at bedtime.   aspirin 81 MG tablet Take 1 tablet (81 mg total) by mouth daily.   cloZAPine 100 MG tablet Commonly known as:  CLOZARIL Take 2 tablets (200 mg total) by mouth 2 (two) times daily.   LINZESS 290 MCG Caps capsule Generic drug:  linaclotide Take 290 mcg by mouth daily before breakfast.   Melatonin 10 MG Tabs Take 10 mg by mouth at bedtime.   metoCLOPramide 5 MG tablet Commonly known as:  REGLAN Take 5 mg by mouth 2 (two) times daily.   omeprazole 40 MG capsule Commonly known as:  PRILOSEC Take 40 mg by mouth every evening.   PARoxetine 40 MG tablet Commonly known as:  PAXIL Take 40 mg by mouth every evening.   polyethylene glycol packet Commonly known as:  MIRALAX / GLYCOLAX Take 17 g by mouth daily.   terbinafine 250 MG tablet Commonly known as:  LAMISIL Take 250 mg by mouth at bedtime.  zolpidem 10 MG tablet Commonly known as:  AMBIEN Take 10 mg by mouth at bedtime.      Follow-up Information    Pavelock, Ralene Bathe, MD. Schedule an appointment as soon as possible for a visit in 1 week(s).   Specialty:  Internal Medicine Contact information: [address] [place] [address] [phone]          No Known Allergies  You were cared for by a hospitalist during your hospital stay. If you have any questions about your discharge medications or the care you received while you were in the hospital after you are discharged, you can call the unit and asked to speak with the hospitalist on call if the hospitalist that took care of you is not available. Once you are discharged, your primary care physician will handle any further medical issues. Please note that no refills for any discharge medications will be authorized once you are discharged, as it is imperative that you return to your primary care physician (or establish a relationship with a primary care physician if you do not have one) for your aftercare needs so that they can reassess your need for medications and monitor your lab values.  Consultations:  General surgery   Procedures/Studies: Ct Abdomen Pelvis Wo Contrast  Result Date: 06/29/2018 CLINICAL DATA:  Possible SBO, hx SBO, no bowel movement since Thursday, abdominal pain and distention EXAM: CT ABDOMEN AND PELVIS WITHOUT CONTRAST TECHNIQUE: Multidetector CT imaging of the abdomen and pelvis was performed following the standard protocol without IV contrast. COMPARISON:  03/07/2017 and acute abdomen series from 5/20 02/2017 FINDINGS: Lower chest: Mild subsegmental atelectasis in both lower lobes. Fluid distention of the esophagus. Hepatobiliary: Unremarkable Pancreas: Borderline prominence of dorsal pancreatic duct in the pancreatic head. Otherwise unremarkable. Spleen: Unremarkable Adrenals/Urinary Tract: No stones are identified. No hydronephrosis. Normal renal contours. The density of urine in the urinary bladder is slightly higher than expected at 37 Hounsfield units. Stomach/Bowel: Prominent gastric distention and distention of nearly all of the small bowel. Transition point what appears to be distal ileum about 8  cm from the ileocecal valve in the vicinity of image 18/6 where there appears to be a transition from dilated to nondilated terminal ileum. The bowel loops are indistinct in this vicinity. There is some mild prominence of stool in the distal colon. Small bowel loops are dilated up to about 6.3 cm. There is some low-level edema tracking along small bowel mesentery. Vascular/Lymphatic: There is no significant atherosclerotic calcification in the abdominal aorta. Reproductive: Moderate prostatomegaly. 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 IMPRESSION: 1. The bowel obstruction in the right upper quadrant appears to be in the vicinity of the terminal ileum, about 8 cm from the ileocecal valve. A specific cause for the obstruction is not well seen; it is possible that there is an adhesion or that the high position of the cecum is allowing for a twisting in the bowel. I am skeptical of a trans mesenteric internal hernia as the cause based on the overall appearance. Small bowel loops are dilated up to 6.3 cm and there is also distention of the stomach and fluid distention of the esophagus. 2. High density of urine in the urinary bladder, correlate with urine analysis in assessing for hematuria or proteinuria. 3. Moderate prostatomegaly. Electronically Signed   By: Van Clines M.D.   On: 06/29/2018 18:33   Dg Chest  2 View  Result Date: 06/29/2018 CLINICAL DATA:  Abdominal pain and distention. No bowel movement since Thursday EXAM: CHEST - 2 VIEW COMPARISON:  03/15/2017 radiograph FINDINGS: Low lung volumes are present, causing crowding of the pulmonary vasculature. Mild atelectasis of both lung bases. The lungs appear otherwise clear. Heart size within normal limits given the low lung volumes and AP projection. No pleural effusion. IMPRESSION: 1. Low lung volumes with mild bibasilar atelectasis.  Electronically Signed   By: Van Clines M.D.   On: 06/29/2018 18:21   Dg Abd 1 View  Result Date: 07/01/2018 CLINICAL DATA:  Status post nasogastric tube placement. EXAM: ABDOMEN - 1 VIEW COMPARISON:  Supine abdominal radiograph of earlier today at 9:03 a.m. FINDINGS: The tip of the endotracheal tube lies at the level of the GE junction. The proximal port is not visible and is presumably in the distal third of the esophagus. There remain loops of moderately distended gas-filled small bowel in throughout the abdomen. There is some gas in the right colon. There is stool in the rectum. IMPRESSION: Inadequate positioning of the esophagogastric tube. Advancement by at least 15 cm is recommended. Electronically Signed   By: David  Martinique M.D.   On: 07/01/2018 12:07   Dg Abd 1 View  Result Date: 06/30/2018 CLINICAL DATA:  NG tube placement. EXAM: ABDOMEN - 1 VIEW COMPARISON:  Radiographs and CT yesterday. FINDINGS: Tip and side port of the enteric tube below the diaphragm in the stomach. Persistent small bowel dilatation primarily in the right abdomen again seen. IMPRESSION: Tip and side port of the enteric tube below the diaphragm in the stomach. Persistent small bowel obstruction. Electronically Signed   By: Keith Rake M.D.   On: 06/30/2018 01:32   Ct Abdomen Pelvis W Contrast  Result Date: 07/10/2018 CLINICAL DATA:  Abdominal distention, diaphoresis, history of chronic constipation and partial bowel obstruction, schizophrenia EXAM: CT ABDOMEN AND PELVIS WITH CONTRAST TECHNIQUE: Multidetector CT imaging of the abdomen and pelvis was performed using the standard protocol following bolus administration of intravenous contrast. CONTRAST:  120mL ISOVUE-300 IOPAMIDOL (ISOVUE-300) INJECTION 61% COMPARISON:  CT abdomen pelvis of 06/29/2018 FINDINGS: Lower chest: There is mild basilar atelectasis in both lung bases and there is a tiny right pleural effusion now present. Cardiomegaly is stable.  Hepatobiliary: A small low-attenuation structure near the dome of the right lobe of the liver probably represents a small hemangioma with a similar small low-attenuation process peripherally in the right lower lobe also most likely represent hemangioma on image 29 series 2. No other hepatic abnormality is seen. Gallbladder is somewhat contracted and no calcified gallstones are seen. Pancreas: The pancreas is stable with minimally prominent pancreatic duct but no evidence of edema is seen and no mass is noted. Spleen: The spleen is unremarkable. Adrenals/Urinary Tract: The pelvocaliceal systems are unremarkable. The ureters appear normal in caliber. The urinary bladder is not well distended but no obvious abnormality seen other than somewhat thick wall which could be due to a degree of bladder outlet obstruction. Stomach/Bowel: The stomach is minimally distended with fluid and air with no abnormality noted. There are distended loops of small bowel particularly in the left abdomen, but the distal ileum is well visualized with no dilatation. The change in caliber is difficult to ascertain but it may be in the mid right pelvis as seen on image 74 series 2. This is consistent with a low-grade partial small bowel obstruction possibly due to and adhesion. There is some feces scattered throughout the  transverse and right colon but no colonic distention is seen. The distal and terminal ileum is unremarkable in caliber. The appendix appears normal. Vascular/Lymphatic: The abdominal aorta is normal in caliber with only mild abdominal aortic atherosclerosis present. No adenopathy is seen. Reproductive: The prostate is diffusely prominent measuring 4.3 x 5.4 cm possibly causing a degree of bladder outlet obstruction. Other: No abdominal wall hernia is seen. Musculoskeletal: The lumbar vertebrae are normal alignment with diffuse degenerative spurring. Intervertebral disc spaces appear relatively normal. The SI joints appear  corticated. IMPRESSION: 1. Findings consistent with partial small bowel obstruction with transition point possibly in the right to mid pelvis most likely due to adhesion. 2. Somewhat prominent prostate may cause a degree of bladder outlet obstruction. 3. Bibasilar linear atelectasis with small right pleural effusion. Electronically Signed   By: Ivar Drape M.D.   On: 07/10/2018 10:40   Dg Abd 2 Views  Result Date: 07/07/2018 CLINICAL DATA:  Abdominal pain EXAM: ABDOMEN - 2 VIEW COMPARISON:  07/04/2018 FINDINGS: Scattered large and small bowel gas is noted. A single dilated loop of small bowel is noted no free air is seen. No bony abnormality is noted. IMPRESSION: Single dilated loop of small bowel. This is relatively stable from the prior exam. Electronically Signed   By: Inez Catalina M.D.   On: 07/07/2018 13:16   Dg Abd Portable 1v  Result Date: 07/04/2018 CLINICAL DATA:  Follow up small bowel obstruction EXAM: PORTABLE ABDOMEN - 1 VIEW COMPARISON:  07/03/2018 FINDINGS: Previously seen nasogastric catheter is not well appreciated on this exam. Scattered large and small bowel gas is noted. A few mildly prominent loops of small bowel are noted relatively similar to that seen on the prior exam but improved from the study from 07/01/2018. No free air is seen. No bony abnormality is noted. IMPRESSION: Stable minimal dilation of small bowel. The overall appearance is improved however from the more previous exam of 07/01/2018 Electronically Signed   By: Inez Catalina M.D.   On: 07/04/2018 07:28   Dg Abd Portable 1v  Result Date: 07/03/2018 CLINICAL DATA:  NG tube placement EXAM: PORTABLE ABDOMEN - 1 VIEW COMPARISON:  KUB of 07/01/2018 FINDINGS: The tip of the NG tube is probably within the gastroesophageal junction and could be advanced 7 several cm. There may be minimal atelectasis at the left lung base. The bowel gas pattern is nonspecific. IMPRESSION: 1. Tip of NG tube probably within the gastroesophageal  junction. Recommend advancing into the more distal stomach. 2. Probable mild atelectasis at the left lung base. Correlate clinically. Electronically Signed   By: Ivar Drape M.D.   On: 07/03/2018 10:10   Dg Abd Portable 1v  Result Date: 07/01/2018 CLINICAL DATA:  Follow-up small bowel obstruction EXAM: PORTABLE ABDOMEN - 1 VIEW COMPARISON:  Abdomen film of 06/30/2017 and CT abdomen pelvis of 06/29/2017 FINDINGS: There has been some improvement in the gaseous distention of small bowel now measuring 50 mm compared to a 88 mm on the prior image at the area of greatest distention. No colonic bowel gas is seen and this pattern is consistent with persistent small-bowel obstruction. No opaque calculi are seen. IMPRESSION: Some improvement in the degree of gaseous distention of small bowel although persistent small bowel obstruction is noted. No colonic bowel gas is seen. Electronically Signed   By: Ivar Drape M.D.   On: 07/01/2018 10:03   Dg Abd Portable 1v  Result Date: 06/29/2018 CLINICAL DATA:  Nasogastric tube placement, bowel obstruction EXAM: PORTABLE  ABDOMEN - 1 VIEW COMPARISON:  Portable exam 1909 hours compared to CT abdomen and pelvis 06/29/2018 FINDINGS: Tip of nasogastric tube projects over the lateral LEFT mid abdomen, likely mid stomach. Dilated small bowel loops persist. Prominent stool in LEFT colon. Food debris and air within stomach. Bibasilar pulmonary infiltrates. Osseous structures unremarkable. IMPRESSION: Nasogastric tube projects over mid stomach. Persistent dilatation of small bowel loops with note of prominent stool in LEFT colon. Electronically Signed   By: Lavonia Dana M.D.   On: 06/29/2018 19:57   Dg Abd Portable 2v  Result Date: 07/11/2018 CLINICAL DATA:  Follow up small bowel obstruction EXAM: PORTABLE ABDOMEN - 2 VIEW COMPARISON:  06/30/2018 FINDINGS: Scattered large and small bowel gas is noted. A few loops of mildly dilated small bowel are again identified similar to that seen  on recent CT examination. No free air is noted. Mild degenerative changes of lumbar spine are noted. No other focal abnormality is seen. IMPRESSION: Mild stable small bowel dilatation.  No free air is noted. Electronically Signed   By: Inez Catalina M.D.   On: 07/11/2018 07:00      Subjective: No complaints.  Feeling better.  Tolerating oral diet without any issues.  General = no fevers, chills, dizziness, malaise, fatigue HEENT/EYES = negative for pain, redness, loss of vision, double vision, blurred vision, loss of hearing, sore throat, hoarseness, dysphagia Cardiovascular= negative for chest pain, palpitation, murmurs, lower extremity swelling Respiratory/lungs= negative for shortness of breath, cough, hemoptysis, wheezing, mucus production Gastrointestinal= negative for nausea, vomiting,, abdominal pain, melena, hematemesis Genitourinary= negative for Dysuria, Hematuria, Change in Urinary Frequency MSK = Negative for arthralgia, myalgias, Back Pain, Joint swelling  Neurology= Negative for headache, seizures, numbness, tingling  Psychiatry= Negative for anxiety, depression, suicidal and homocidal ideation Allergy/Immunology= Medication/Food allergy as listed  Skin= Negative for Rash, lesions, ulcers, itching   Discharge Exam: Vitals:   07/11/18 2129 07/12/18 0440  BP: (!) 129/92 (!) 182/107  Pulse: 92 (!) 116  Resp: 20 20  Temp: 98.2 F (36.8 C)   SpO2: 100% 97%   Vitals:   07/11/18 0449 07/11/18 1507 07/11/18 2129 07/12/18 0440  BP: 129/83 (!) 138/98 (!) 129/92 (!) 182/107  Pulse: 86 (!) 108 92 (!) 116  Resp: 18 14 20 20   Temp: 98.3 F (36.8 C) 97.9 F (36.6 C) 98.2 F (36.8 C)   TempSrc:  Oral Oral   SpO2: 97% 100% 100% 97%  Weight:      Height:        General: Pt is alert, awake, not in acute distress, chronic speech difficulty Cardiovascular: RRR, S1/S2 +, no rubs, no gallops Respiratory: CTA bilaterally, no wheezing, no rhonchi Abdominal: Soft, NT, ND, bowel  sounds + Extremities: no edema, no cyanosis    The results of significant diagnostics from this hospitalization (including imaging, microbiology, ancillary and laboratory) are listed below for reference.     Microbiology: No results found for this or any previous visit (from the past 240 hour(s)).   Labs: BNP (last 3 results) No results for input(s): BNP in the last 8760 hours. Basic Metabolic Panel: Recent Labs  Lab 07/07/18 0536 07/08/18 0538 07/09/18 0537 07/10/18 1001 07/11/18 0542  NA 136 145 145 144 143  K 2.9* 3.2* 4.1 4.2 3.9  CL 99 109 111 109 110  CO2 27 28 26 26 26   GLUCOSE 121* 131* 122* 160* 142*  BUN 9 5* <5* <5* <5*  CREATININE 0.98 1.01 0.97 1.11 0.95  CALCIUM 8.1* 8.4*  8.8* 9.2 8.7*  MG 1.9 2.1  --   --   --    Liver Function Tests: Recent Labs  Lab 07/07/18 0536 07/10/18 1001  AST 26 28  ALT 25 25  ALKPHOS 62 77  BILITOT 0.6 0.1*  PROT 5.7* 6.7  ALBUMIN 2.9* 3.7   Recent Labs  Lab 07/10/18 1001  LIPASE 33   No results for input(s): AMMONIA in the last 168 hours. CBC: Recent Labs  Lab 07/07/18 0536 07/08/18 0538 07/10/18 1001 07/11/18 0542  WBC 13.8* 11.1* 14.5* 12.8*  NEUTROABS 9.2*  --  11.7*  --   HGB 12.1* 11.5* 13.0 12.2*  HCT 36.2* 34.6* 39.5 37.5*  MCV 93.5 94.3 95.4 95.7  PLT 390 405* 374 389   Cardiac Enzymes: No results for input(s): CKTOTAL, CKMB, CKMBINDEX, TROPONINI in the last 168 hours. BNP: Invalid input(s): POCBNP CBG: No results for input(s): GLUCAP in the last 168 hours. D-Dimer No results for input(s): DDIMER in the last 72 hours. Hgb A1c No results for input(s): HGBA1C in the last 72 hours. Lipid Profile No results for input(s): CHOL, HDL, LDLCALC, TRIG, CHOLHDL, LDLDIRECT in the last 72 hours. Thyroid function studies No results for input(s): TSH, T4TOTAL, T3FREE, THYROIDAB in the last 72 hours.  Invalid input(s): FREET3 Anemia work up No results for input(s): VITAMINB12, FOLATE, FERRITIN, TIBC,  IRON, RETICCTPCT in the last 72 hours. Urinalysis    Component Value Date/Time   COLORURINE YELLOW 07/10/2018 1022   APPEARANCEUR CLEAR 07/10/2018 1022   LABSPEC 1.010 07/10/2018 1022   PHURINE 7.0 07/10/2018 1022   GLUCOSEU NEGATIVE 07/10/2018 1022   HGBUR TRACE (A) 07/10/2018 1022   BILIRUBINUR NEGATIVE 07/10/2018 1022   BILIRUBINUR Negative 01/31/2015 1010   KETONESUR NEGATIVE 07/10/2018 1022   PROTEINUR NEGATIVE 07/10/2018 1022   UROBILINOGEN 0.2 01/31/2015 1010   UROBILINOGEN 1.0 06/12/2012 1315   NITRITE NEGATIVE 07/10/2018 1022   LEUKOCYTESUR TRACE (A) 07/10/2018 1022   Sepsis Labs Invalid input(s): PROCALCITONIN,  WBC,  LACTICIDVEN Microbiology No results found for this or any previous visit (from the past 240 hour(s)).   Time coordinating discharge:  I have spent 35 minutes face to face with the patient and on the ward discussing the patients care, assessment, plan and disposition with other care givers. >50% of the time was devoted counseling the patient about the risks and benefits of treatment/Discharge disposition and coordinating care.   SIGNED:   Damita Lack, MD  [facility] 07/12/2018, 10:17 AM Pager   If 7PM-7AM, please contact night-coverage [email or web address] Password [email or web address]

## 2018-07-12 NOTE — Progress Notes (Signed)
Pt discharged, group home notified of discharge. Legal guardian called twice, no answer. Left message, group home made aware we were unable to reach legal guardian.

## 2020-01-13 IMAGING — DX DG CHEST 2V
2 series · 2 of 2 positions shown · non-contrast
Comparison: 03/15/2017 radiograph

CLINICAL DATA: Abdominal pain and distention. No bowel movement
since [REDACTED]

EXAM:
CHEST - 2 VIEW

[chest lat]
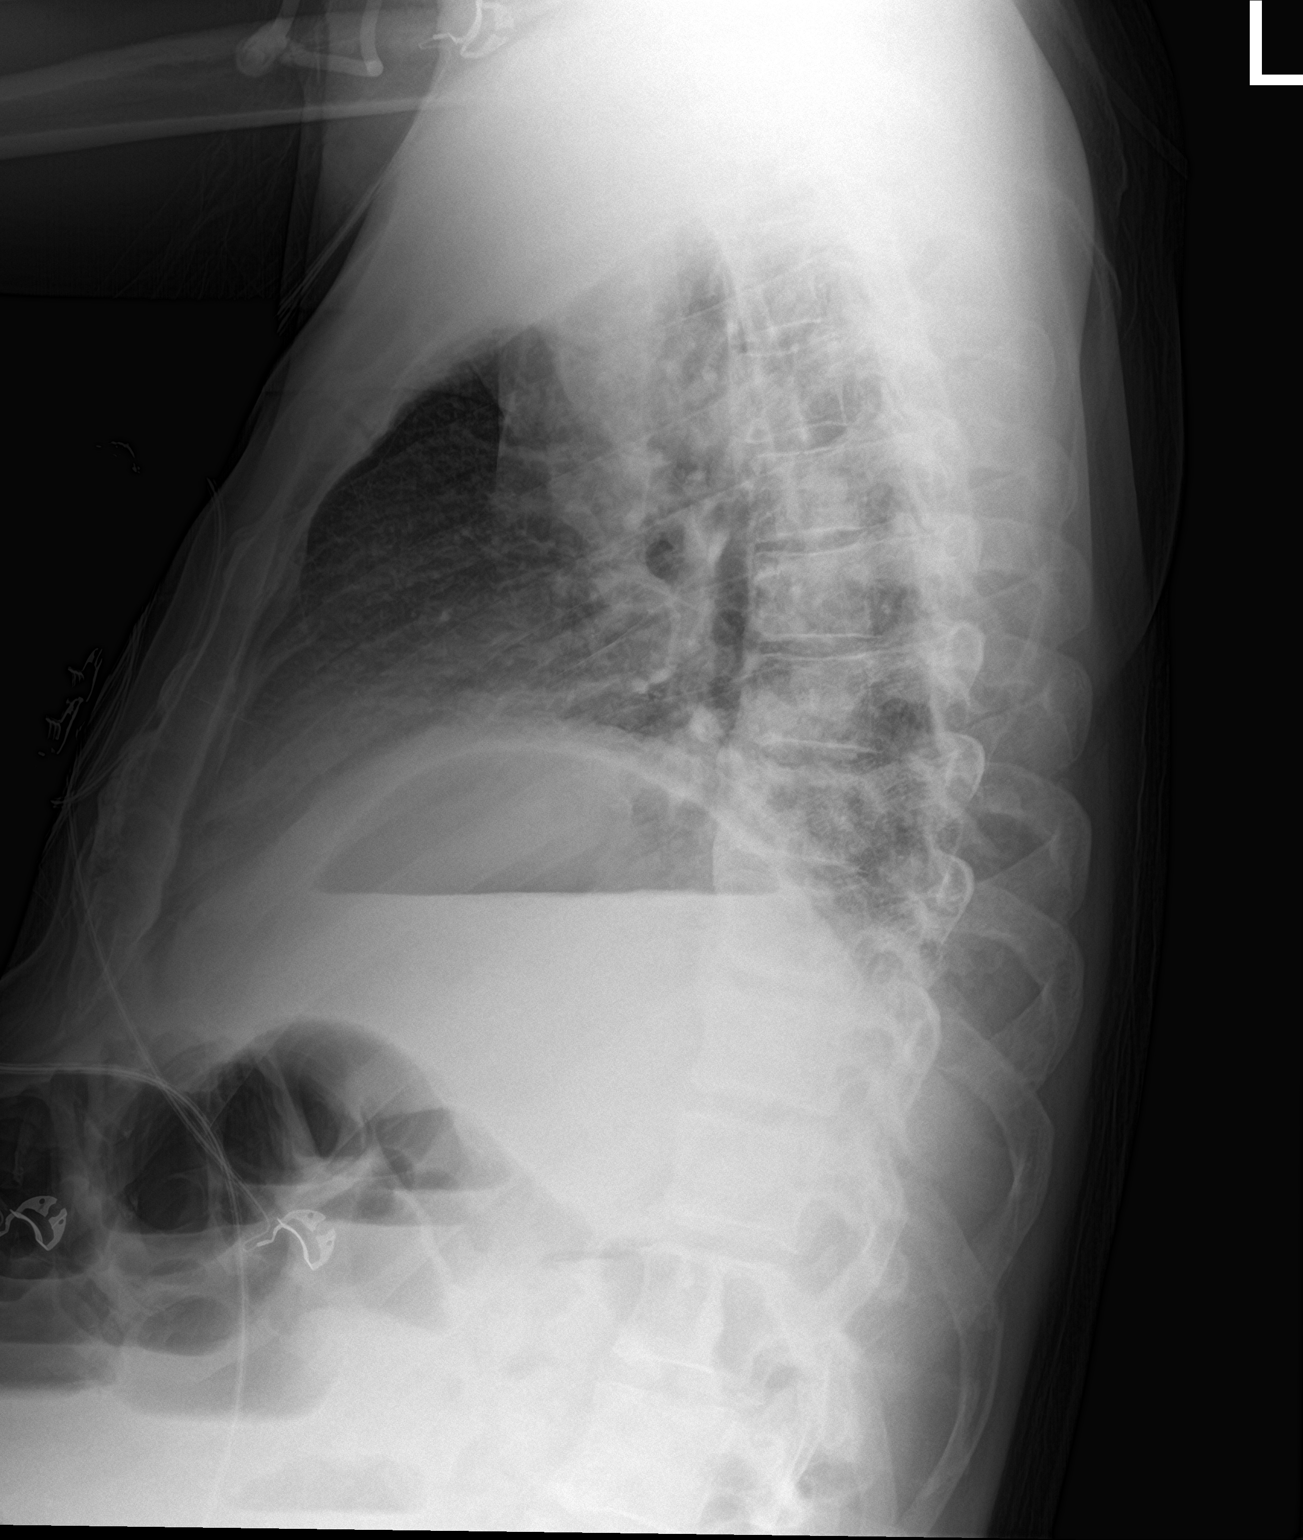

[chest ap strecther]
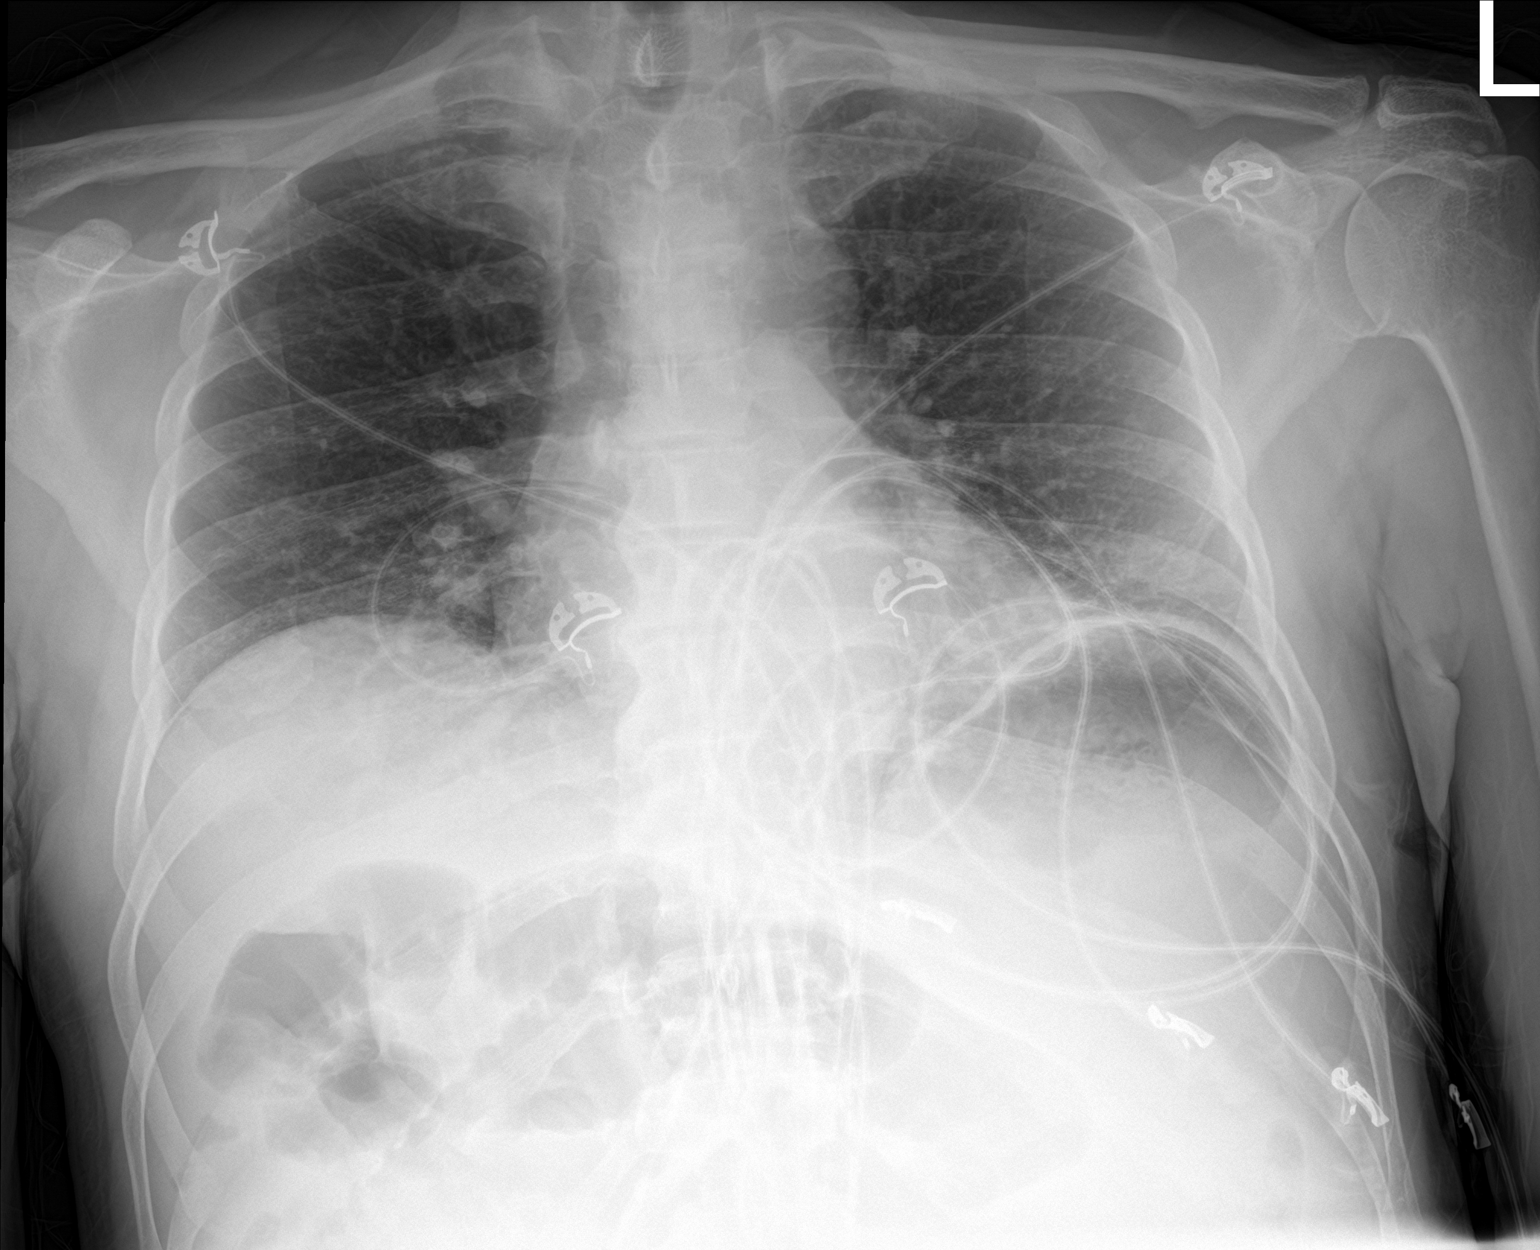

[2 of 2 positions shown; findings below may reference images not displayed]

FINDINGS: Low lung volumes are present, causing crowding of the pulmonary
vasculature. Mild atelectasis of both lung bases. The lungs appear
otherwise clear. Heart size within normal limits given the low lung
volumes and AP projection.

No pleural effusion.
IMPRESSION: 1. Low lung volumes with mild bibasilar atelectasis.

## 2020-01-13 IMAGING — DX DG ABD PORTABLE 1V
1 series · 1 of 1 positions shown · non-contrast
Comparison: Portable exam 4646 hours compared to CT abdomen and
pelvis 06/29/2018

CLINICAL DATA: Nasogastric tube placement, bowel obstruction

EXAM:
PORTABLE ABDOMEN - 1 VIEW

[abdomen kub]
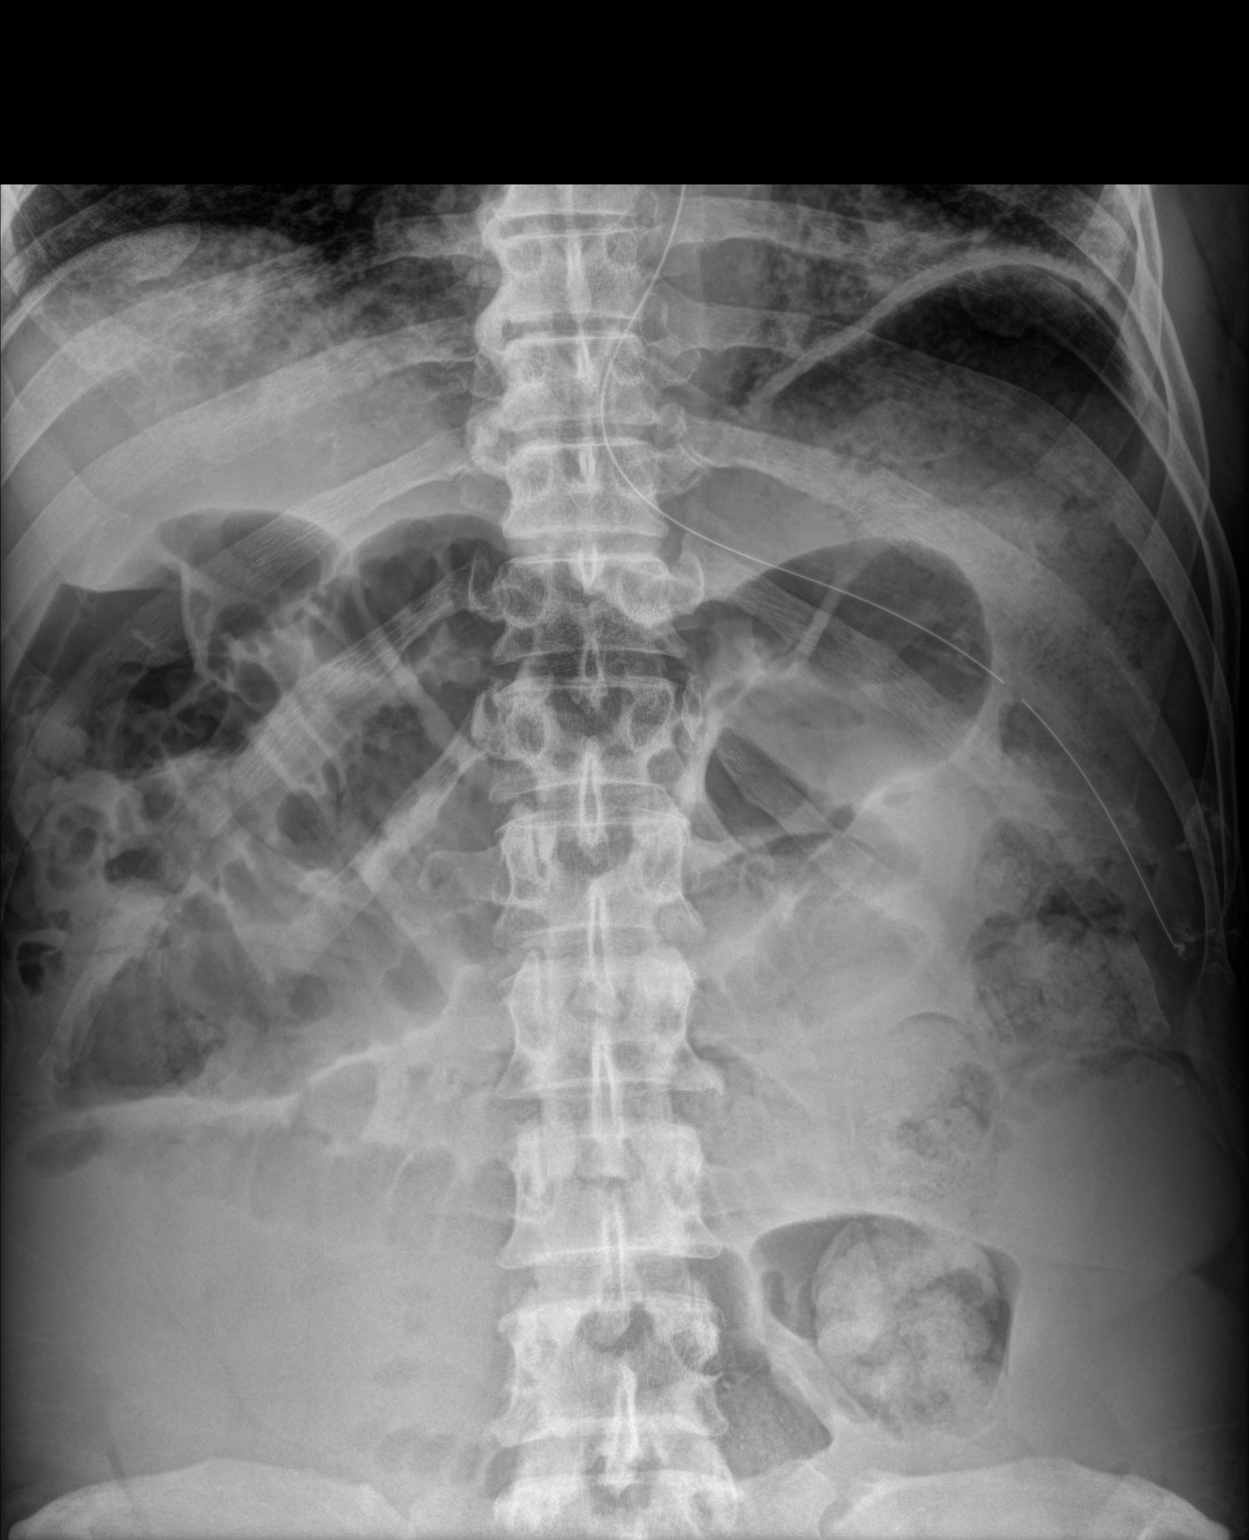

[1 of 1 positions shown; findings below may reference images not displayed]

FINDINGS: Tip of nasogastric tube projects over the lateral LEFT mid abdomen,
likely mid stomach.

Dilated small bowel loops persist.

Prominent stool in LEFT colon.

Food debris and air within stomach.

Bibasilar pulmonary infiltrates.

Osseous structures unremarkable.
IMPRESSION: Nasogastric tube projects over mid stomach.

Persistent dilatation of small bowel loops with note of prominent
stool in LEFT colon.

## 2020-01-13 IMAGING — CT CT ABD-PELV W/O CM
2 of 4 series · 15 of 46 positions shown, 17 images · non-contrast
Comparison: 03/07/2017 and acute abdomen series from [DATE] [DATE]

CLINICAL DATA: Possible SBO, hx SBO, no bowel movement since
[REDACTED], abdominal pain and distention

EXAM:
CT ABDOMEN AND PELVIS WITHOUT CONTRAST
TECHNIQUE: Multidetector CT imaging of the abdomen and pelvis was performed
following the standard protocol without IV contrast.

[Series 2: axial st · axial · 0.86mm/px · z∈[-500,+10]mm · 12 of 117 slices shown, 14 images]
[im 10/117  soft-tissue]
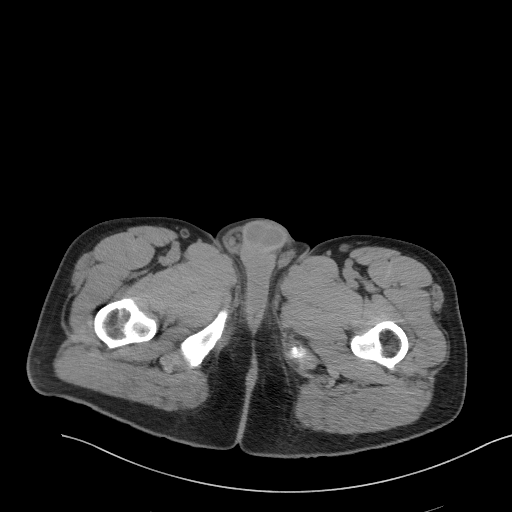
[im 10/117  bone]
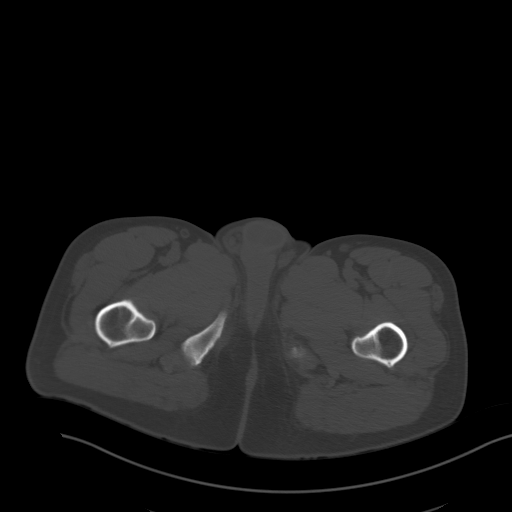
[im 19/117  soft-tissue]
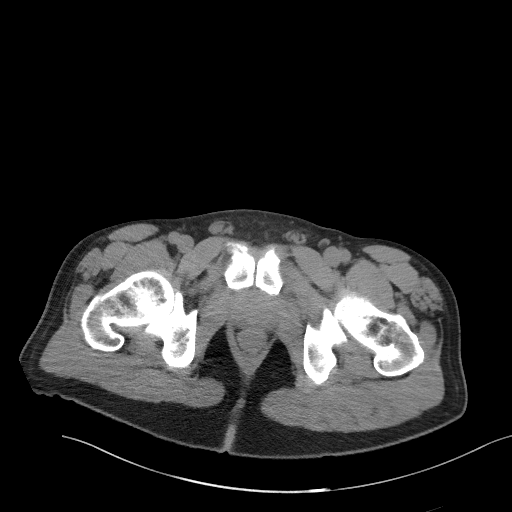
[im 28/117  soft-tissue]
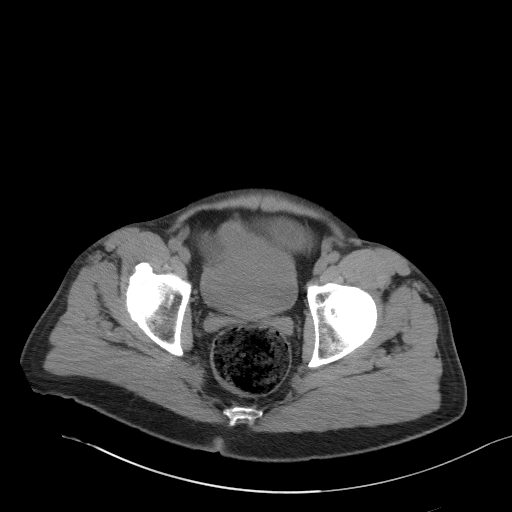
[im 38/117  soft-tissue]
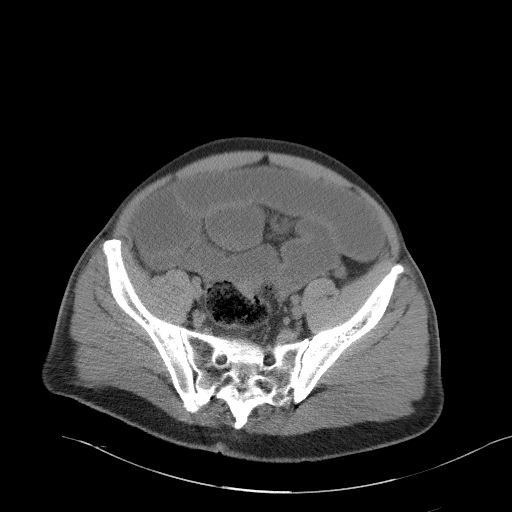
[im 47/117  soft-tissue]
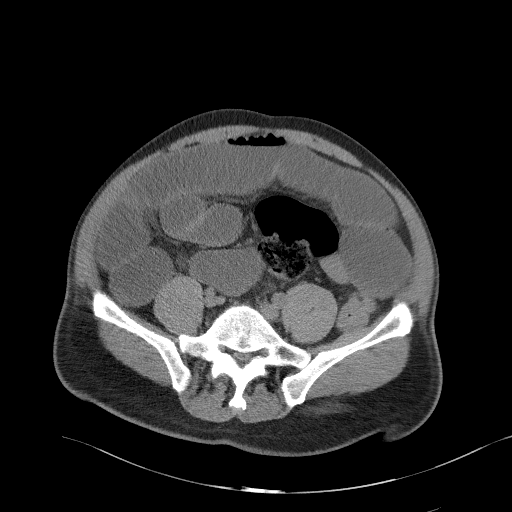
[im 56/117  soft-tissue]
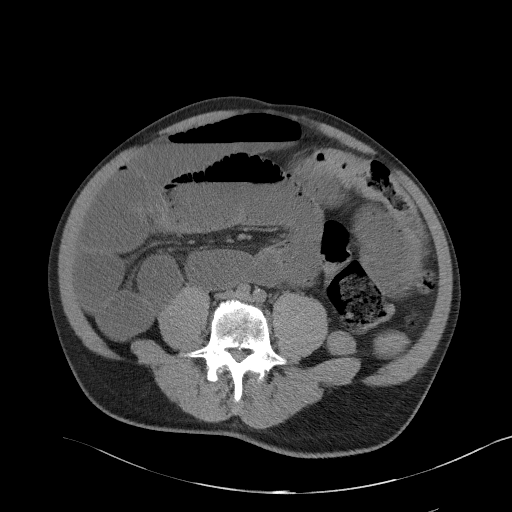
[im 65/117  soft-tissue]
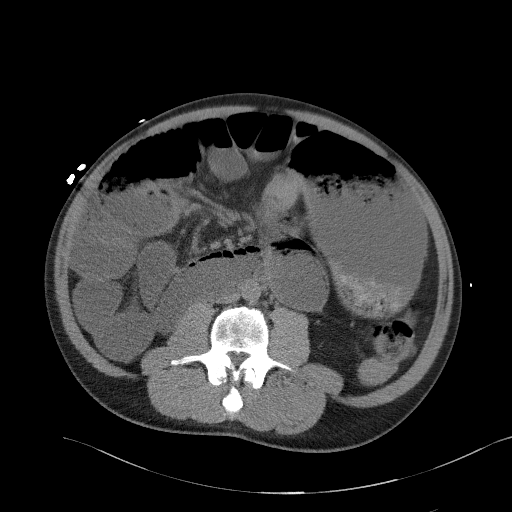
[im 75/117  soft-tissue]
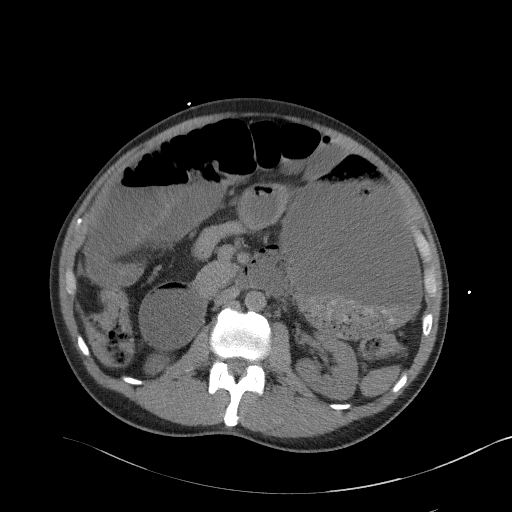
[im 84/117  soft-tissue]
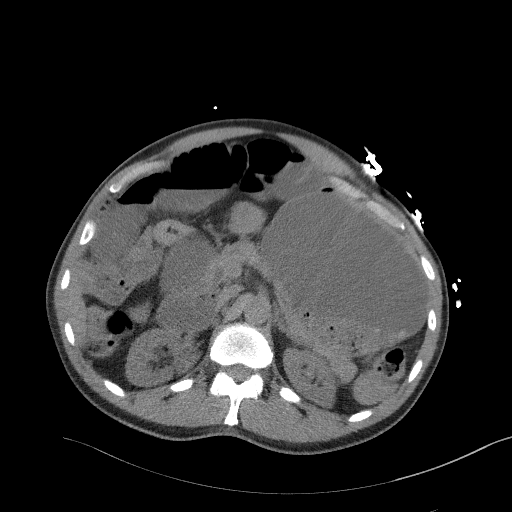
[im 84/117  bone]
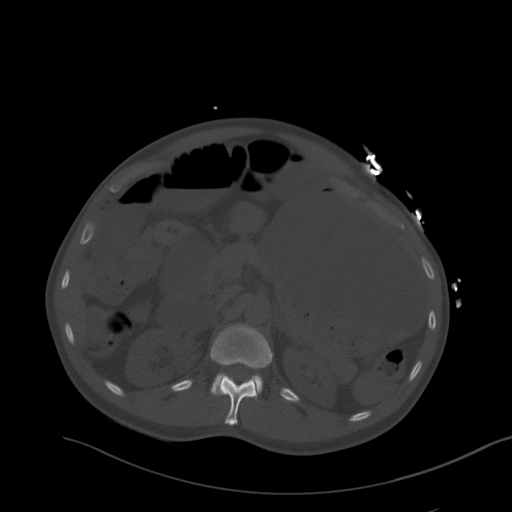
[im 93/117  soft-tissue]
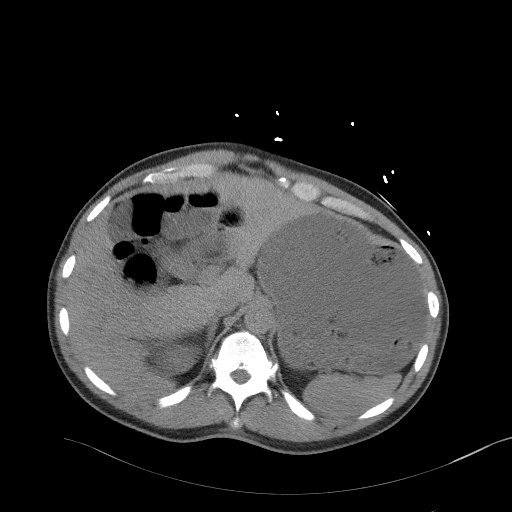
[im 103/117  soft-tissue]
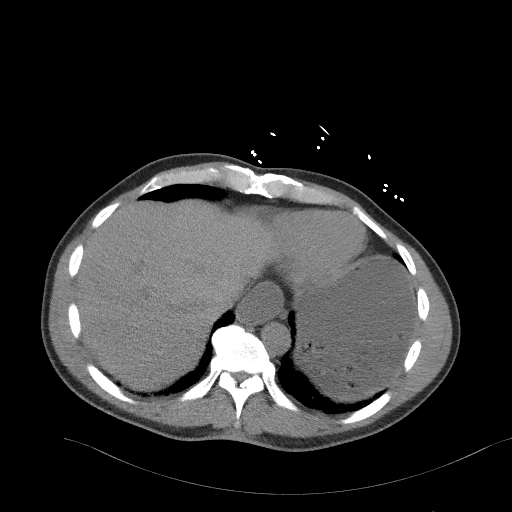
[im 112/117  soft-tissue]
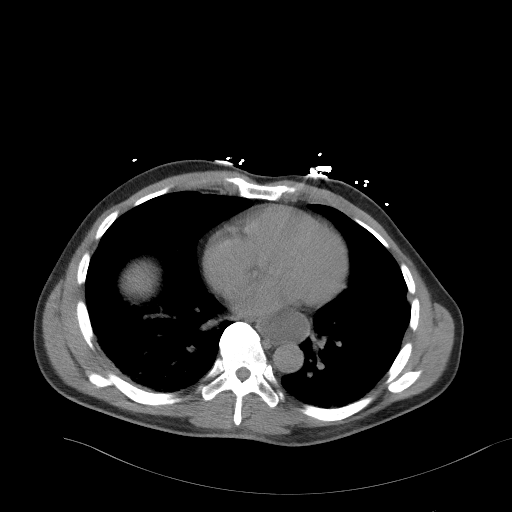

[Series 5: coronal st · coronal · 0.85mm/px · 3 of 106 slices shown]
[im 36/106  soft-tissue]
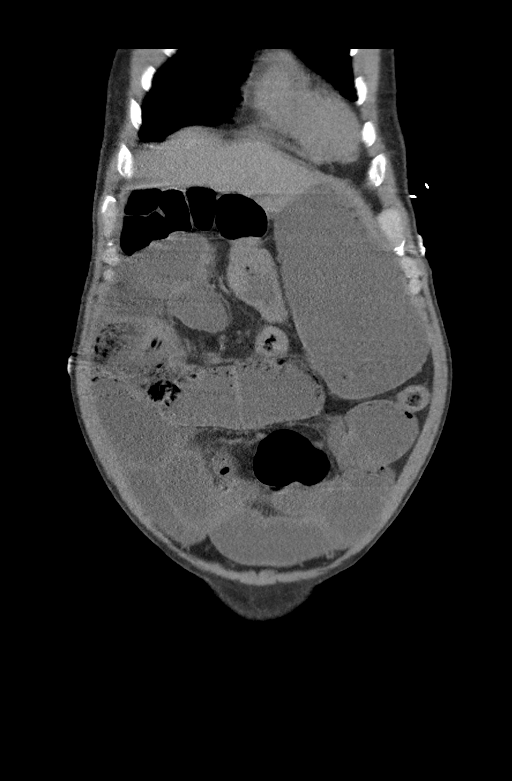
[im 47/106  soft-tissue]
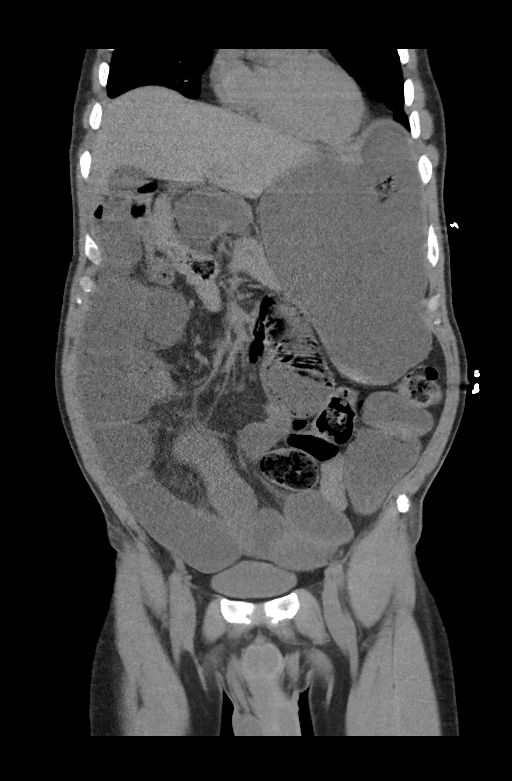
[im 59/106  soft-tissue]
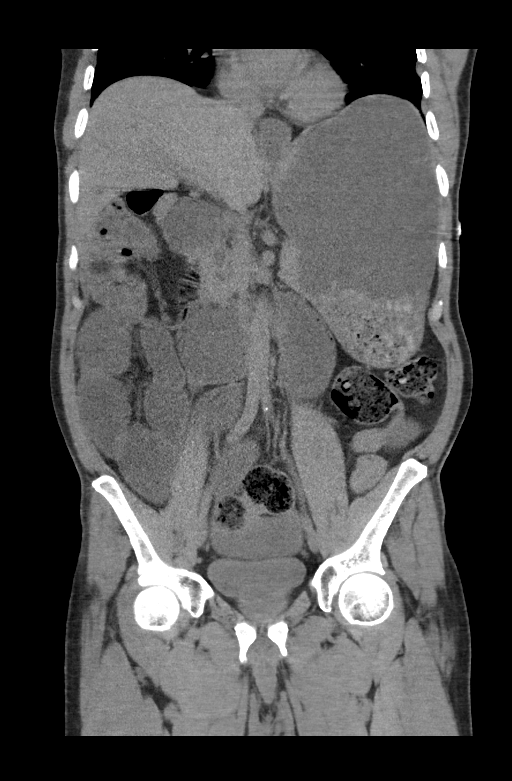

[15 of 46 positions shown; findings below may reference images not displayed]

FINDINGS: Lower chest: Mild subsegmental atelectasis in both lower lobes.
Fluid distention of the esophagus.

Hepatobiliary: Unremarkable

Pancreas: Borderline prominence of dorsal pancreatic duct in the
pancreatic head. Otherwise unremarkable.

Spleen: Unremarkable

Adrenals/Urinary Tract: No stones are identified. No hydronephrosis.
Normal renal contours. The density of urine in the urinary bladder
is slightly higher than expected at 37 Hounsfield units.

Stomach/Bowel: Prominent gastric distention and distention of nearly
all of the small bowel. Transition point what appears to be distal
ileum about 8 cm from the ileocecal valve in the vicinity of image
[DATE] where there appears to be a transition from dilated to
nondilated terminal ileum. The bowel loops are indistinct in this
vicinity. There is some mild prominence of stool in the distal
colon. Small bowel loops are dilated up to about 6.3 cm. There is
some low-level edema tracking along small bowel mesentery.

Vascular/Lymphatic: There is no significant atherosclerotic
calcification in the abdominal aorta.

Reproductive: Moderate prostatomegaly.

Other: No overt ascites

Musculoskeletal: Lumbar spondylosis and degenerative disc disease
with congenitally short pedicles in the lower lumbar spine.
IMPRESSION: 1. The bowel obstruction in the right upper quadrant appears to be
in the vicinity of the terminal ileum, about 8 cm from the ileocecal
valve. A specific cause for the obstruction is not well seen; it is
possible that there is an adhesion or that the high position of the
cecum is allowing for a twisting in the bowel. I am skeptical of a
trans mesenteric internal hernia as the cause based on the overall
appearance. Small bowel loops are dilated up to 6.3 cm and there is
also distention of the stomach and fluid distention of the
esophagus.
2. High density of urine in the urinary bladder, correlate with
urine analysis in assessing for hematuria or proteinuria.
3. Moderate prostatomegaly.

## 2021-01-02 ENCOUNTER — Encounter (HOSPITAL_COMMUNITY): Payer: Self-pay

## 2021-01-02 ENCOUNTER — Other Ambulatory Visit: Payer: Self-pay

## 2021-01-02 ENCOUNTER — Ambulatory Visit (HOSPITAL_COMMUNITY)
Admission: EM | Admit: 2021-01-02 | Discharge: 2021-01-02 | Disposition: A | Discharge status: Home / Self Care | Payer: Medicare Other | Attending: Emergency Medicine | Admitting: Emergency Medicine

## 2021-01-02 DIAGNOSIS — A084 Viral intestinal infection, unspecified: Secondary | ICD-10-CM

## 2021-01-02 MED ORDER — PEPTO-BISMOL 524 MG/30ML PO SUSP: 30.0000 mL | Freq: Four times a day (QID) | ORAL | 0 refills | Status: DC | PRN

## 2021-01-02 NOTE — ED Provider Notes (Addendum)
[facility]    CSN: [id number] Arrival date & time: 01/02/21  1412      History   Chief Complaint Chief Complaint  Patient presents with  . Abdominal Pain    HPI Jordan Kennedy is a 65 y.o. male.   Patient presents with mid abdominal pain described as cramping and feeling sick beginning Saturday, feeling somewhat better today. Started having soft bowel movements Sunday evening. Has had 5 bowel movement since. Tolerating food and liquid.  Denies nausea, vomiting, fever, chills, body aches, back, pain, changes with urination, cough, shortness of breath, chest pain. No known sick contacts. Attends day program. History of small bowel obstruction, ileus and constipation   Guardian present for entirety of exam.     Past Medical History:  Diagnosis Date  . Anemia   . Bowel obstruction ([place])   . BPH (benign prostatic hyperplasia)   . Chronic constipation   . History of ileus   . Mental retardation   . Partial bowel obstruction ([place])   . Schizophrenia ([place])    Monarche every three months;     Patient Active Problem List   Diagnosis Date Noted  . SBO (small bowel obstruction) ([place]) 07/10/2018  . Small bowel obstruction ([place]) 06/29/2018  . AKI (acute kidney injury) ([place]) 06/29/2018  . SIRS (systemic inflammatory response syndrome) ([place]) 06/29/2018  . Abdominal distension   . Ileus ([place]) 03/07/2017  . Anemia, iron deficiency 09/18/2015  . Constipation 04/25/2012  . Leukocytosis, unspecified 04/25/2012  . Anxiety and depression 04/25/2012  . 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 Schizophrenia ([place])   . Intellectual disability     History reviewed. No pertinent surgical history.     Home Medications    Prior to Admission medications   Medication Sig Start Date End Date Taking? Authorizing Provider  bismuth subsalicylate (PEPTO-BISMOL) 262 MG/15ML suspension Take 30 mLs by mouth every 6 (six) hours as needed. 01/02/21  Yes Pricila Bridge R, NP  amantadine (SYMMETREL)  100 MG capsule Take 100 mg by mouth at bedtime.     [provider]  aspirin 81 MG tablet Take 1 tablet (81 mg total) by mouth daily. 01/31/15   Wardell Honour, MD  cloZAPine (CLOZARIL) 100 MG tablet Take 2 tablets (200 mg total) by mouth 2 (two) times daily. 03/12/17   Eugenie Filler, MD  linaclotide Rolan Lipa) 290 MCG CAPS capsule Take 290 mcg by mouth daily before breakfast.    [provider]  Melatonin 10 MG TABS Take 10 mg by mouth at bedtime.     [provider]  metoCLOPramide (REGLAN) 5 MG tablet Take 5 mg by mouth 2 (two) times daily.    [provider]  omeprazole (PRILOSEC) 40 MG capsule Take 40 mg by mouth every evening.    [provider]  PARoxetine (PAXIL) 40 MG tablet Take 40 mg by mouth every evening.     [provider]  polyethylene glycol (MIRALAX / GLYCOLAX) packet Take 17 g by mouth daily. 07/10/18   Kayleen Memos, DO  terbinafine (LAMISIL) 250 MG tablet Take 250 mg by mouth at bedtime.     [provider]  zolpidem (AMBIEN) 10 MG tablet Take 10 mg by mouth at bedtime.     [provider]    Family History Family History  Problem Relation Age of Onset  . Cancer Mother        unknown type    Social History Social History   Tobacco Use  .  Smoking status: Current Every Day Smoker    Packs/day: 0.25    Years: 30.00    Pack years: 7.50    Types: Cigarettes  . Smokeless tobacco: Never Used  Vaping Use  . Vaping Use: Never used  Substance Use Topics  . Alcohol use: No  . Drug use: No     Allergies   Patient has no known allergies.   Review of Systems Review of Systems  Constitutional: Negative.   HENT: Negative.   Respiratory: Negative.   Cardiovascular: Negative.   Gastrointestinal: Positive for abdominal pain and diarrhea. Negative for abdominal distention, anal bleeding, blood in stool, constipation, nausea, rectal pain and vomiting.  Genitourinary: Negative.    Musculoskeletal: Negative.   Neurological: Negative.      Physical Exam Triage Vital Signs ED Triage Vitals  Enc Vitals Group     BP 01/02/21 1436 121/87     Pulse Rate 01/02/21 1436 95     Resp 01/02/21 1436 19     Temp 01/02/21 1436 98.5 F (36.9 C)     Temp src --      SpO2 01/02/21 1436 98 %     Weight --      Height --      Head Circumference --      Peak Flow --      Pain Score 01/02/21 1435 0     Pain Loc --      Pain Edu? --      Excl. in [place]? --    No data found.  Updated Vital Signs BP 121/87   Pulse 95   Temp 98.5 F (36.9 C)   Resp 19   SpO2 98%   Visual Acuity Right Eye Distance:   Left Eye Distance:   Bilateral Distance:    Right Eye Near:   Left Eye Near:    Bilateral Near:     Physical Exam Constitutional:      Appearance: He is well-developed and normal weight.  HENT:     Head: Normocephalic.  Eyes:     Extraocular Movements: Extraocular movements intact.  Cardiovascular:     Heart sounds: Normal heart sounds.  Pulmonary:     Effort: Pulmonary effort is normal.     Breath sounds: Normal breath sounds.  Abdominal:     General: Abdomen is flat. Bowel sounds are increased.     Palpations: Abdomen is soft.     Tenderness: There is abdominal tenderness in the epigastric area. There is no right CVA tenderness, left CVA tenderness, guarding or rebound.     Hernia: No hernia is present.  Skin:    General: Skin is warm and dry.  Neurological:     General: No focal deficit present.     Mental Status: He is alert and oriented to person, place, and time.  Psychiatric:        Mood and Affect: Mood normal.        Behavior: Behavior normal.      UC Treatments / Results  Labs (all labs ordered are listed, but only abnormal results are displayed) Labs Reviewed - No data to display  EKG   Radiology No results found.  Procedures Procedures (including critical care time)  Medications Ordered in UC Medications - No data to  display  Initial Impression / Assessment and Plan / UC Course  I have reviewed the triage vital signs and the nursing notes.  Pertinent labs & imaging results that were available during my care  of the patient were reviewed by me and considered in my medical decision making (see chart for details).  Viral Gastroenteritis  1. Pepto bismol 30 mL every 6 hours as needed 2. Advised to ensure patient is stays hydrated 3. Follow up for increased abdominal pain, vomiting, fever, chills, diarrhea becoming watery  Final Clinical Impressions(s) / UC Diagnoses   Final diagnoses:  Viral gastroenteritis     Discharge Instructions     Can use pepto-bismol 30 ml every 6 hours as needed  Virus has to run its course, make sure to stay hydrated through fluids and meals to prevent dehydration   Follow up if pain worsens, fever, chils, body aches, diarrhea becomes watery,vomiting   ED Prescriptions    Medication Sig Dispense Auth. Provider   bismuth subsalicylate (PEPTO-BISMOL) 262 MG/15ML suspension Take 30 mLs by mouth every 6 (six) hours as needed. 360 mL Hans Eden, NP     PDMP not reviewed this encounter.   Hans Eden, NP 01/02/21 1521    Hans Eden, NP 01/02/21 1522    Hans Eden, NP 01/02/21 1524

## 2021-01-02 NOTE — ED Triage Notes (Signed)
Pt presents with complaints of feeling sick today while he was at his day program. Reports he was having some general abdominal pain before. Reports he had a bowel movement and now feels better. States he still feels in general ill.

## 2021-01-02 NOTE — Discharge Instructions (Addendum)
Can use pepto-bismol 30 ml every 6 hours as needed  Virus has to run its course, make sure to stay hydrated through fluids and meals to prevent dehydration   Follow up if pain worsens, fever, chils, body aches, diarrhea becomes watery,vomiting

## 2021-01-27 ENCOUNTER — Telehealth: Payer: Self-pay | Admitting: Family Medicine

## 2021-01-27 DIAGNOSIS — K219 Gastro-esophageal reflux disease without esophagitis: Secondary | ICD-10-CM

## 2021-01-27 MED ORDER — OMEPRAZOLE 40 MG PO CPDR: 40.0000 mg | DELAYED_RELEASE_CAPSULE | Freq: Every evening | ORAL | 0 refills | Status: DC

## 2021-01-27 NOTE — Telephone Encounter (Signed)
Caller Name: Latricia Heft, caregiver Call back phone #: [phone]  MEDICATION(S): omeprazole 40mg  1/daily in evening  [name] states he discussed this with Dr. Gena Fray yesterday and thought they agreed medication would be sent in.  Preferred Pharmacy:  [facility], [place] - [address] Phone:  [phone]  Fax:  [phone]

## 2021-01-27 NOTE — Telephone Encounter (Signed)
Please see message and advise.  Thanks. Dm/cma

## 2021-01-30 NOTE — Telephone Encounter (Signed)
lft VM that RX was sent to pharmacy on Friday.  Dm/cma

## 2021-01-31 ENCOUNTER — Ambulatory Visit (INDEPENDENT_AMBULATORY_CARE_PROVIDER_SITE_OTHER): Payer: Medicare Other | Admitting: Family Medicine

## 2021-01-31 ENCOUNTER — Encounter: Payer: Self-pay | Admitting: Family Medicine

## 2021-01-31 ENCOUNTER — Other Ambulatory Visit: Payer: Self-pay

## 2021-01-31 VITALS — BP 116/70 | HR 100 | Temp 97.6°F | Ht 74.0 in | Wt 195.0 lb

## 2021-01-31 DIAGNOSIS — Z13220 Encounter for screening for lipoid disorders: Secondary | ICD-10-CM

## 2021-01-31 DIAGNOSIS — K5901 Slow transit constipation: Secondary | ICD-10-CM

## 2021-01-31 DIAGNOSIS — B353 Tinea pedis: Secondary | ICD-10-CM

## 2021-01-31 DIAGNOSIS — N179 Acute kidney failure, unspecified: Secondary | ICD-10-CM

## 2021-01-31 DIAGNOSIS — D509 Iron deficiency anemia, unspecified: Secondary | ICD-10-CM | POA: Diagnosis not present

## 2021-01-31 DIAGNOSIS — F209 Schizophrenia, unspecified: Secondary | ICD-10-CM | POA: Diagnosis not present

## 2021-01-31 DIAGNOSIS — Z114 Encounter for screening for human immunodeficiency virus [HIV]: Secondary | ICD-10-CM

## 2021-01-31 DIAGNOSIS — Z86010 Personal history of colonic polyps: Secondary | ICD-10-CM

## 2021-01-31 DIAGNOSIS — B351 Tinea unguium: Secondary | ICD-10-CM

## 2021-01-31 DIAGNOSIS — F32A Depression, unspecified: Secondary | ICD-10-CM

## 2021-01-31 DIAGNOSIS — Z8719 Personal history of other diseases of the digestive system: Secondary | ICD-10-CM | POA: Insufficient documentation

## 2021-01-31 DIAGNOSIS — F419 Anxiety disorder, unspecified: Secondary | ICD-10-CM

## 2021-01-31 DIAGNOSIS — Z1159 Encounter for screening for other viral diseases: Secondary | ICD-10-CM

## 2021-01-31 DIAGNOSIS — K219 Gastro-esophageal reflux disease without esophagitis: Secondary | ICD-10-CM | POA: Insufficient documentation

## 2021-01-31 LAB — BASIC METABOLIC PANEL
BUN: 15 mg/dL (ref 6–23)
CO2: 26 mEq/L (ref 19–32)
Calcium: 9.7 mg/dL (ref 8.4–10.5)
Chloride: 106 mEq/L (ref 96–112)
Creatinine, Ser: 1.06 mg/dL (ref 0.40–1.50)
GFR: 75.22 mL/min (ref >60.00)
Glucose, Bld: 86 mg/dL (ref 70–99)
Potassium: 4.1 mEq/L (ref 3.5–5.1)
Sodium: 141 mEq/L (ref 135–145)

## 2021-01-31 LAB — CBC
HCT: 41.9 % (ref 39.0–52.0)
Hemoglobin: 13.9 g/dL (ref 13.0–17.0)
MCHC: 33.1 g/dL (ref 30.0–36.0)
MCV: 94.5 fl (ref 78.0–100.0)
Platelets: 255 10*3/uL (ref 150.0–400.0)
RBC: 4.44 Mil/uL (ref 4.22–5.81)
RDW: 15.3 % (ref 11.5–15.5)
WBC: 9.5 10*3/uL (ref 4.0–10.5)

## 2021-01-31 LAB — LIPID PANEL
Cholesterol: 144 mg/dL (ref 0–200)
HDL: 41.4 mg/dL (ref >39.00)
LDL Cholesterol: 74 mg/dL (ref 0–99)
NonHDL: 102.6
Total CHOL/HDL Ratio: 3
Triglycerides: 143 mg/dL (ref 0.0–149.0)
VLDL: 28.6 mg/dL (ref 0.0–40.0)

## 2021-01-31 MED ORDER — LINACLOTIDE 290 MCG PO CAPS
290.0000 ug | ORAL_CAPSULE | Freq: Every day | ORAL | 11 refills | Status: DC
Start: 2021-01-31 — End: 2022-01-24

## 2021-01-31 MED ORDER — CLOTRIMAZOLE 1 % EX CREA
1.0000 | TOPICAL_CREAM | Freq: Two times a day (BID) | CUTANEOUS | 0 refills | Status: DC
Start: 2021-01-31 — End: 2021-02-22

## 2021-01-31 NOTE — Progress Notes (Signed)
[facility] [address] [place] [address] Dept: [phone] Dept Fax: [phone]  New Patient Office Visit  Subjective:    Patient ID: Jordan Kennedy, male    DOB: Sep 03, 1956, 65 y.o..   MRN: [id number]  Chief Complaint  Patient presents with  . Establish Care    NP- CPE/labs.   No concerns.     History of Present Illness:  Patient is in today to establish care. Mr. Jordan Kennedy presents with his case worker, Jordan Kennedy. Mr. Jordan Kennedy is originally from [place], [place]. However, it appears he has lived in [place] most of his life. He currently lives in a group home. His parents are deceased (mother from cancer, father from old age). Mr. Jordan Kennedy has no children. He admits to smoking 3 times a day and notes he is not ready to quit.  Mr. Jordan Kennedy has a history of schizophrenia, anxiety with depression, and intellectual disability. He is currently followed by Dr. Darleene Cleaver (psychiatry). He is managed on clozapine, paroxetine, and amantadine. Additionally, he has insomnia issues and is provide Ambien and melatonin.  Mr. Jordan Kennedy has a history of multiple past hospital admissions related to partial small bowel obstruction/ileus. It was determined that this was due to an impairment in bowel motility contributing to chronic constipation. He had a colonoscopy performed by Dr. Fuller Plan (gastroenterology) in 2017. He had two polyps removed at that time and was recommended to have a repeat in 5 years. He is managed on Linzess, senna, metoclopramide, and Miralax for this. Currently, he is having three bowel movements a week, which is an improvement.  Mr. Jordan Kennedy has seen Dr. Louis Meckel (urology) related to urinary obstructive symptoms. He is currently managed on Myrbetriq and tamsulosin for this.  Mr. Jordan Kennedy has a history of GERD. He is managed on omeprazole for this. His chart indicates a prior history of iron deficiency anemia. I see that Dr. Fuller Plan had  recommended an EGD in the past, but cannot find evidence that this was performed.  Past Medical History: Patient Active Problem List   Diagnosis Date Noted  . History of small bowel obstruction 01/31/2021  . History of colon polyps 01/31/2021  . Onychomycosis 01/31/2021  . AKI (acute kidney injury) ([place]) 06/29/2018  . Anemia, iron deficiency 09/18/2015  . Constipation due to slow transit 04/25/2012  . Leukocytosis, unspecified 04/25/2012  . Anxiety and depression 04/25/2012  . 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 Schizophrenia ([place])   . Intellectual disability    History reviewed. No pertinent surgical history.  Family History  Problem Relation Age of Onset  . Cancer Mother        unknown type   Outpatient Medications Prior to Visit  Medication Sig Dispense Refill  . amantadine (SYMMETREL) 100 MG capsule Take 100 mg by mouth at bedtime.     Marland Kitchen aspirin 81 MG tablet Take 1 tablet (81 mg total) by mouth daily. 100 tablet 3  . cloZAPine (CLOZARIL) 100 MG tablet Take 2 tablets (200 mg total) by mouth 2 (two) times daily. 120 tablet 0  . Melatonin 10 MG TABS Take 10 mg by mouth at bedtime.     . metoCLOPramide (REGLAN) 5 MG tablet Take 5 mg by mouth 2 (two) times daily.    Marland Kitchen MYRBETRIQ 50 MG TB24 tablet Take 50 mg by mouth daily.    Marland Kitchen omeprazole (PRILOSEC) 40 MG capsule Take 1 capsule (40 mg total) by mouth every evening. 30 capsule 0  . PARoxetine (PAXIL) 40 MG tablet Take  40 mg by mouth every evening.     . polyethylene glycol (MIRALAX / GLYCOLAX) packet Take 17 g by mouth daily. 14 each 0  . senna-docusate (SENOKOT-S) 8.6-50 MG tablet Take 1 tablet by mouth 2 (two) times daily.    . tamsulosin (FLOMAX) 0.4 MG CAPS capsule Take 0.4 mg by mouth daily.    Marland Kitchen zolpidem (AMBIEN) 10 MG tablet Take 10 mg by mouth at bedtime.    Marland Kitchen linaclotide (LINZESS) 290 MCG CAPS capsule Take 290 mcg by mouth daily before breakfast.    . bismuth subsalicylate (PEPTO-BISMOL) 262 MG/15ML suspension Take 30 mLs by mouth  every 6 (six) hours as needed. (Patient not taking: Reported on 01/31/2021) 360 mL 0  . terbinafine (LAMISIL) 250 MG tablet Take 250 mg by mouth at bedtime.  (Patient not taking: Reported on 01/31/2021)     No facility-administered medications prior to visit.   No Known Allergies    Objective:   Today's Vitals   01/31/21 0910  BP: 116/70  Pulse: 100  Temp: 97.6 F (36.4 C)  TempSrc: Oral  SpO2: 95%  Weight: 195 lb (88.5 kg)  Height: 6\' 2"  (2.42 m)   Body mass index is 25.04 kg/m.   General: Well developed, well nourished. No acute distress. HEENT: Normocephalic, non-traumatic. PERRL, EOMI. Conjunctiva clear. External ears normal. Left EAC obstructed by   wax.  Right EAC and TM normal. Nose clear without congestion or rhinorrhea. Mucous membranes moist. Oropharynx   clear. Edentulous. Neck: Supple. No lymphadenopathy. No thyromegaly. Lungs: Clear to auscultation bilaterally. No wheezing, rales or rhonchi. CV: RRR without murmurs or rubs. Pulses 2+ bilaterally. Abdomen: Soft, non-tender. Bowel sounds positive, normal pitch and frequency. No hepatosplenomegaly. No rebound or  guarding. Back: Straight.  Extremities: Full ROM. No joint swelling or tenderness. No edema noted. Skin: Warm and dry. Mild maceration between toes. Great toe nails both with yellow discoloration and ridging. Right great   toe nail is thickened. Neuro:CN II-XII intact. Normal sensation and DTR bilaterally. Psych: Alert and oriented. Normal mood and affect.  Health Maintenance Due  Topic Date Due  . Hepatitis C Screening  Never done  . TETANUS/TDAP  03/31/2019     Assessment & Plan:   1. Schizophrenia, unspecified type ([place]) Followed by psychiatry. Stable on clozapine and amantadine. Completed paperwork for Group home, including physical form, encounter form, and approval of standing med orders.  2. Anxiety and depression Followed by psychiatry. Stable on Paxil.  3. Constipation due to slow  transit Reviewed prior hospital notes and GI consult. Currently managed with Linzess, senna, Reglan, and Miralax. Appears to be doing well on this regimen. I will renew his Linzess.  - linaclotide (LINZESS) 290 MCG CAPS capsule; Take 1 capsule (290 mcg total) by mouth daily before breakfast.  Dispense: 30 capsule; Refill: 11  4. Iron deficiency anemia, unspecified iron deficiency anemia type Past history of anemia. I will recheck this today.  - CBC  5. AKI (acute kidney injury) ([place]) Past history of AKI. I cannot find records to explain what happened. I will check a BMP to assess kidney funciton.  - Basic metabolic panel  6. History of colon polyps Patient due for repeat colonoscopy. I will refer him to Dr. Fuller Plan.  - Ambulatory referral to Gastroenterology  7. Tinea pedis of both feet Recommend clotrimazole cream.  - clotrimazole (LOTRIMIN) 1 % cream; Apply 1 application topically 2 (two) times daily.  Dispense: 30 g; Refill: 0  8. Screening for lipid disorders  -  Lipid panel  9. Encounter for hepatitis C screening test for low risk patient  - HCV Ab w Reflex to Quant PCR  10. Screening for HIV (human immunodeficiency virus)  - HIV Antibody (routine testing w rflx)  11. Onychomycosis Patient apparently was treated with prior Lamisil, but has not had condition resolve. He has an appointment for foot care soon.  12. Gastroesophageal reflux disease, unspecified whether esophagitis present On Prilosec. Stable. If anemia remains resolved, I would hold off on an EGD at this point.   Haydee Salter, MD

## 2021-02-01 LAB — HCV AB W REFLEX TO QUANT PCR: HCV Ab: <0.1 s/co ratio (ref 0.0–0.9)

## 2021-02-01 LAB — HCV INTERPRETATION

## 2021-02-01 LAB — HIV ANTIBODY (ROUTINE TESTING W REFLEX): HIV 1&2 Ab, 4th Generation: NONREACTIVE

## 2021-02-10 ENCOUNTER — Telehealth: Payer: Self-pay | Admitting: Family Medicine

## 2021-02-10 DIAGNOSIS — K5901 Slow transit constipation: Secondary | ICD-10-CM

## 2021-02-10 MED ORDER — METOCLOPRAMIDE HCL 5 MG PO TABS: 5.0000 mg | ORAL_TABLET | Freq: Two times a day (BID) | ORAL | 11 refills | Status: DC

## 2021-02-10 NOTE — Telephone Encounter (Signed)
Patient's caretaker, James, notified VIA phone. Dm/cma  

## 2021-02-10 NOTE — Telephone Encounter (Signed)
What is the name of the medication? metoCLOPramide (REGLAN) 5 MG tablet [[id number]]    Have you contacted your pharmacy to request a refill? Pt is needing a refill, I informed him he might need an appointment.  Which pharmacy would you like this sent to? Pharmacy  [facility] [place], [place] - [address]  [address], [place] [address]  Phone:  [phone] Fax:  [phone]       Patient notified that their request is being sent to the clinical staff for review and that they should receive a call once it is complete. If they do not receive a call within 72 hours they can check with their pharmacy or our office.

## 2021-02-10 NOTE — Telephone Encounter (Signed)
Refill request for:  Metoclopramide 5 mg LR  HX provider LOV 01/31/21 FOV 05/02/21  Please review and advise.  Thanks.  Dm/cma

## 2021-02-22 ENCOUNTER — Other Ambulatory Visit: Payer: Self-pay | Admitting: Family Medicine

## 2021-02-22 DIAGNOSIS — B353 Tinea pedis: Secondary | ICD-10-CM

## 2021-02-22 DIAGNOSIS — K219 Gastro-esophageal reflux disease without esophagitis: Secondary | ICD-10-CM

## 2021-05-02 ENCOUNTER — Ambulatory Visit (INDEPENDENT_AMBULATORY_CARE_PROVIDER_SITE_OTHER): Payer: Medicare Other | Admitting: Family Medicine

## 2021-05-02 ENCOUNTER — Encounter: Payer: Self-pay | Admitting: Family Medicine

## 2021-05-02 ENCOUNTER — Other Ambulatory Visit: Payer: Self-pay

## 2021-05-02 VITALS — BP 118/76 | HR 100 | Temp 97.9°F | Ht 74.0 in | Wt 209.2 lb

## 2021-05-02 DIAGNOSIS — R399 Unspecified symptoms and signs involving the genitourinary system: Secondary | ICD-10-CM

## 2021-05-02 DIAGNOSIS — Z23 Encounter for immunization: Secondary | ICD-10-CM

## 2021-05-02 DIAGNOSIS — K219 Gastro-esophageal reflux disease without esophagitis: Secondary | ICD-10-CM

## 2021-05-02 DIAGNOSIS — K5901 Slow transit constipation: Secondary | ICD-10-CM

## 2021-05-02 DIAGNOSIS — N138 Other obstructive and reflux uropathy: Secondary | ICD-10-CM | POA: Insufficient documentation

## 2021-05-02 NOTE — Progress Notes (Signed)
[facility] [address] [place] [place] [address] Dept: [phone] Dept Fax: [phone]  Chronic Care Office Visit  Subjective:    Patient ID: Riyaan Heroux, male    DOB: 05-06-1956, 65 y.o..   MRN: [id number]  Chief Complaint  Patient presents with   Follow-up    3 month f/u.  No concerns.      History of Present Illness:  Patient is in today for reassessment of chronic medical issues. Mr. Seppala presents with his case worker, Lenell Antu. He currently lives in a group home.  Mr. Gartin has a history of constipation issues related to slow transit and prior history of bowel obstruction. He notes his bowels are moving regularly at this point.   Mr. Schanz has a history of bladder outlet issues. He is currently managed on Flomax and Mybetriq. He is urinating well at this point without complaints.  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  Family History  Problem Relation Age of Onset   Cancer Mother        unknown type   Outpatient Medications Prior to Visit  Medication Sig Dispense Refill   amantadine (SYMMETREL) 100 MG capsule Take 100 mg by mouth at bedtime.      aspirin 81 MG tablet Take 1 tablet (81 mg total) by mouth daily. 100 tablet 3   clotrimazole (LOTRIMIN) 1 % cream APPLY TWICE DAILY 30 g 2   cloZAPine (CLOZARIL) 100 MG tablet Take 2 tablets (200 mg total) by mouth 2 (two) times daily. 120 tablet 0    linaclotide (LINZESS) 290 MCG CAPS capsule Take 1 capsule (290 mcg total) by mouth daily before breakfast. 30 capsule 11   Melatonin 10 MG TABS Take 10 mg by mouth at bedtime.      metoCLOPramide (REGLAN) 5 MG tablet Take 1 tablet (5 mg total) by mouth 2 (two) times daily. 60 tablet 11   MYRBETRIQ 50 MG TB24 tablet Take 50 mg by mouth daily.     omeprazole (PRILOSEC) 40 MG capsule TAKE ONE CAPSULE EVERY EVENING 30 capsule 2   PARoxetine (PAXIL) 40 MG tablet Take 40 mg by mouth every evening.      polyethylene glycol (MIRALAX / GLYCOLAX) packet Take 17 g by mouth daily. 14 each 0   senna-docusate (SENOKOT-S) 8.6-50 MG tablet Take 1 tablet by mouth 2 (two) times daily.     tamsulosin (FLOMAX) 0.4 MG CAPS capsule Take 0.4 mg by mouth daily.     zolpidem (AMBIEN) 10 MG tablet Take 10 mg by mouth at bedtime.     No facility-administered medications prior to visit.   No Known Allergies    Objective:   Today's Vitals   05/02/21 0944  BP: 118/76  Pulse: 100  Temp: 97.9 F (36.6 C)  TempSrc: Temporal  SpO2: 95%  Weight: 209 lb 3.2 oz (94.9 kg)  Height: 6\' 2"  (1.88 m)   Body mass index is 26.86 kg/m.   General: Well developed, well  nourished. No acute distress. Abdomen: Soft, non-tender. No rebound or guarding. Psych: Alert and oriented. Normal mood and affect.  Health Maintenance Due  Topic Date Due   Zoster Vaccines- Shingrix (1 of 2) Never done   Pneumococcal Vaccine 78-71 Years old (2 - PCV) 04/26/2013   TETANUS/TDAP  03/31/2019   COVID-19 Vaccine (4 - Booster for Pfizer series) 01/25/2021   Lab Results Lab Results  Component Value Date   CHOL 144 01/31/2021   HDL 41.40 01/31/2021   LDLCALC 74 01/31/2021   TRIG 143.0 01/31/2021   CHOLHDL 3 01/31/2021   Lab Results  Component Value Date   WBC 9.5 01/31/2021   HGB 13.9 01/31/2021   HCT 41.9 01/31/2021   MCV 94.5 01/31/2021   PLT 255.0 01/31/2021      Assessment & Plan:   1. Constipation due to slow transit Stable  on on Linzess, senna, metoclopramide, and Miralax regimen. IO will plan to reassess him in 3 months.  2. Gastroesophageal reflux disease, unspecified whether esophagitis present Doing well with omeprazole.  3. Lower urinary tract symptoms (LUTS) Stable on tamsulosin and Myrbetriq.  Haydee Salter, MD

## 2021-05-17 ENCOUNTER — Telehealth: Payer: Self-pay | Admitting: Family Medicine

## 2021-05-17 DIAGNOSIS — Z86010 Personal history of colonic polyps: Secondary | ICD-10-CM

## 2021-05-17 MED ORDER — ASPIRIN 81 MG PO TBEC: 81.0000 mg | DELAYED_RELEASE_TABLET | Freq: Every day | ORAL | 3 refills | Status: DC

## 2021-05-17 NOTE — Telephone Encounter (Signed)
James meachem(pts caregive) calling to get refill on aspirin 81 MG tablet Needs sent to [facility] [place] - [address]

## 2021-05-17 NOTE — Telephone Encounter (Signed)
Please review and advise.  Tried to send to pharmacy and it wouldn't let me. Thanks. Dm/cma

## 2021-05-17 NOTE — Telephone Encounter (Signed)
[place]riar taker) notified VIA phone. Dm/cma

## 2021-05-18 MED ORDER — ASPIRIN 81 MG PO TBEC: 81.0000 mg | DELAYED_RELEASE_TABLET | Freq: Every day | ORAL | 3 refills | Status: DC

## 2021-05-18 NOTE — Telephone Encounter (Signed)
James called pharmacy and they said no RX received for Aspirin '81mg'$  - I saw notes and was confused since RX in system say OTC. Please call back at [phone]

## 2021-05-18 NOTE — Telephone Encounter (Signed)
Called pharmacy and spoke to [place], gave a verbal for the Aspirin 81 mg due to it wouldn't send through the computer.   James notified [place] phone. Dm/cma

## 2021-05-18 NOTE — Addendum Note (Signed)
Addended by: Konrad Saha on: 05/18/2021 01:29 PM   Modules accepted: Orders

## 2021-05-23 ENCOUNTER — Other Ambulatory Visit: Payer: Self-pay | Admitting: Family Medicine

## 2021-05-23 DIAGNOSIS — K219 Gastro-esophageal reflux disease without esophagitis: Secondary | ICD-10-CM

## 2021-05-31 ENCOUNTER — Telehealth: Payer: Self-pay | Admitting: Family Medicine

## 2021-05-31 NOTE — Telephone Encounter (Signed)
Pt dropped off forms to be filled out, in Rudd's folder up front.

## 2021-05-31 NOTE — Telephone Encounter (Signed)
Spoke to patients caretaker, Alyson Locket., scheduled an appointment to fill out [place] form.  Dm/cma

## 2021-06-07 ENCOUNTER — Encounter: Payer: Self-pay | Admitting: Family Medicine

## 2021-06-07 ENCOUNTER — Other Ambulatory Visit: Payer: Self-pay

## 2021-06-07 ENCOUNTER — Ambulatory Visit (INDEPENDENT_AMBULATORY_CARE_PROVIDER_SITE_OTHER): Payer: Medicare Other | Admitting: Family Medicine

## 2021-06-07 VITALS — BP 112/70 | HR 99 | Temp 97.3°F | Ht 74.0 in | Wt 212.0 lb

## 2021-06-07 DIAGNOSIS — F209 Schizophrenia, unspecified: Secondary | ICD-10-CM | POA: Diagnosis not present

## 2021-06-07 DIAGNOSIS — F79 Unspecified intellectual disabilities: Secondary | ICD-10-CM | POA: Diagnosis not present

## 2021-06-07 DIAGNOSIS — Z029 Encounter for administrative examinations, unspecified: Secondary | ICD-10-CM

## 2021-06-07 NOTE — Progress Notes (Signed)
[facility] [address] [place] [address] Dept: [phone] Dept Fax: [phone]  Office Visit  Subjective:    Patient ID: Jordan Kennedy, male    DOB: Apr 10, 1956, 65 y.o..   MRN: [id number]  Chief Complaint  Patient presents with   Follow-up    Discuss medical documents    History of Present Illness:  Patient is in today for completion of a [place] application which would allow him extra assistance with using public transportation. Mr. Jordan Kennedy travels daily form his Group Home to a volunteer job each day. He has a history of intellectual disability and schizophrenia that make him unable to manage using public transit on his own.  Past Medical History: Patient Active Problem List   Diagnosis Date Noted   Lower urinary tract symptoms (LUTS) 05/02/2021   History of small bowel obstruction 01/31/2021   History of colon polyps 01/31/2021   Onychomycosis 01/31/2021   Gastroesophageal reflux disease 01/31/2021   Constipation due to slow transit 04/25/2012   Anxiety and depression 04/25/2012   Edentulous 04/25/2012   Schizophrenia ([place])    Intellectual disability    History reviewed. No pertinent surgical history.  Family History  Problem Relation Age of Onset   Cancer Mother        unknown type   Outpatient Medications Prior to Visit  Medication Sig Dispense Refill   amantadine (SYMMETREL) 100 MG capsule Take 100 mg by mouth at bedtime.      aspirin 81 MG EC tablet Take 1 tablet (81 mg total) by mouth daily. Swallow whole. 90 tablet 3   clotrimazole (LOTRIMIN) 1 % cream APPLY TWICE DAILY 30 g 2   cloZAPine (CLOZARIL) 100 MG tablet Take 2 tablets (200 mg total) by mouth 2 (two) times daily. 120 tablet 0   linaclotide (LINZESS) 290 MCG CAPS capsule Take 1 capsule (290 mcg total) by mouth daily before breakfast. 30 capsule 11   Melatonin 10 MG TABS Take 10 mg by mouth at bedtime.      metoCLOPramide (REGLAN)  5 MG tablet Take 1 tablet (5 mg total) by mouth 2 (two) times daily. 60 tablet 11   MYRBETRIQ 50 MG TB24 tablet Take 50 mg by mouth daily.     omeprazole (PRILOSEC) 40 MG capsule TAKE ONE CAPSULE EVERY EVENING 90 capsule 2   PARoxetine (PAXIL) 40 MG tablet Take 40 mg by mouth every evening.      polyethylene glycol (MIRALAX / GLYCOLAX) packet Take 17 g by mouth daily. 14 each 0   senna-docusate (SENOKOT-S) 8.6-50 MG tablet Take 1 tablet by mouth 2 (two) times daily.     tamsulosin (FLOMAX) 0.4 MG CAPS capsule Take 0.4 mg by mouth daily.     zolpidem (AMBIEN) 10 MG tablet Take 10 mg by mouth at bedtime.     No facility-administered medications prior to visit.   No Known Allergies    Objective:   Today's Vitals   06/07/21 1339  BP: 112/70  Pulse: 99  Temp: (!) 97.3 F (36.3 C)  TempSrc: Temporal  SpO2: 97%  Weight: 212 lb (96.2 kg)  Height: '6\' 2"'$  (1.88 m)   Body mass index is 27.22 kg/m.   General: Well developed, well nourished. No acute distress. Psych: Alert and oriented. Normal mood and affect.  Health Maintenance Due  Topic Date Due   Zoster Vaccines- Shingrix (1 of 2) Never done   Pneumococcal Vaccine 20-28 Years old (2 - PCV) 04/26/2013  COVID-19 Vaccine (4 - Booster for Pfizer series) 01/25/2021   INFLUENZA VACCINE  05/22/2021     Assessment & Plan:   1. Encounters for administrative purpose 2. Intellectual disability 3. Schizophrenia, unspecified type [facility])  Completed "[employer] [place] Professional Verification Form". Copy to be scanned into record. Follow-up as needed.  Haydee Salter, MD

## 2021-07-18 ENCOUNTER — Telehealth: Payer: Self-pay | Admitting: Family Medicine

## 2021-07-18 NOTE — Telephone Encounter (Signed)
Form placed on providers desk to be filled out. Dm/cma

## 2021-07-18 NOTE — Telephone Encounter (Signed)
Spoke to patient's caretaker, [name], and advised that Dr Gena Fray is unable to fill this out due to patient falls under a different category and will need to go to his psychiatrist.   He will pick up form tomorrow and get it to them.  Dm/cma

## 2021-07-18 NOTE — Telephone Encounter (Signed)
Pts caretaker Latricia Heft dropped off paperwork regarding an assessment that needs to be done. I put paperwork in Dr Rudds folder upfront. Needs faxed by 08/10/21

## 2021-07-28 NOTE — Telephone Encounter (Signed)
[name] came by this morning and picked up pt's form.

## 2021-08-02 ENCOUNTER — Ambulatory Visit: Payer: Medicare Other | Admitting: Family Medicine

## 2021-08-03 ENCOUNTER — Other Ambulatory Visit: Payer: Self-pay | Admitting: Family Medicine

## 2021-08-10 ENCOUNTER — Other Ambulatory Visit: Payer: Self-pay

## 2021-08-11 ENCOUNTER — Encounter: Payer: Self-pay | Admitting: Family Medicine

## 2021-08-11 ENCOUNTER — Ambulatory Visit (INDEPENDENT_AMBULATORY_CARE_PROVIDER_SITE_OTHER): Payer: Medicare Other | Admitting: Family Medicine

## 2021-08-11 VITALS — BP 124/80 | HR 82 | Temp 97.5°F | Ht 74.0 in | Wt 216.2 lb

## 2021-08-11 DIAGNOSIS — F32A Depression, unspecified: Secondary | ICD-10-CM

## 2021-08-11 DIAGNOSIS — Z23 Encounter for immunization: Secondary | ICD-10-CM

## 2021-08-11 DIAGNOSIS — K5901 Slow transit constipation: Secondary | ICD-10-CM | POA: Diagnosis not present

## 2021-08-11 DIAGNOSIS — F79 Unspecified intellectual disabilities: Secondary | ICD-10-CM

## 2021-08-11 DIAGNOSIS — F419 Anxiety disorder, unspecified: Secondary | ICD-10-CM

## 2021-08-11 DIAGNOSIS — K219 Gastro-esophageal reflux disease without esophagitis: Secondary | ICD-10-CM

## 2021-08-11 DIAGNOSIS — F209 Schizophrenia, unspecified: Secondary | ICD-10-CM

## 2021-08-11 NOTE — Progress Notes (Signed)
[facility] [address] [place] [address] Dept: [phone] Dept Fax: [phone]  Chronic Care Office Visit  Subjective:    Patient ID: Jordan Kennedy, male    DOB: 10-Nov-1955, 65 y.o..   MRN: [id number]  Chief Complaint  Patient presents with   Follow-up    3 month f/u.  No concerns. Will get flu shot today.     History of Present Illness:  Patient is in today for reassessment of chronic medical issues. Jordan Kennedy presents with his case worker, Jordan Kennedy. He currently lives in a group home.   Jordan Kennedy has a history of schizophrenia, anxiety with depression, and intellectual disability. He is currently followed by Dr. Darleene Cleaver (psychiatry). He is managed on clozapine, paroxetine, and amantadine.   Jordan Kennedy has insomnia issues and is provide Ambien and melatonin.  Jordan Kennedy has a history of constipation issues related to slow transit and prior history of bowel obstruction. He does have linaclotide (Linzess), Miralax, and senna-docusate available for management of this. He notes his bowels are moving regularly at this point.    Jordan Kennedy has a history of bladder outlet issues. He is currently managed on Flomax and Mybetriq. He is urinating well at this point without complaints.  Jordan Kennedy has a history of GERD. He is managed well on omeprazole. He has PRN Reglan if needed. He notes he is eating well without complaints.  Past Medical History: Patient Active Problem List   Diagnosis Date Noted   Lower urinary tract symptoms (LUTS) 05/02/2021   History of small bowel obstruction 01/31/2021   History of colon polyps 01/31/2021   Onychomycosis 01/31/2021   Gastroesophageal reflux disease 01/31/2021   Constipation due to slow transit 04/25/2012   Anxiety and depression 04/25/2012   Edentulous 04/25/2012   Schizophrenia ([place])    Intellectual disability    No past surgical history on file.  Family History   Problem Relation Age of Onset   Cancer Mother        unknown type   Outpatient Medications Prior to Visit  Medication Sig Dispense Refill   amantadine (SYMMETREL) 100 MG capsule Take 100 mg by mouth at bedtime.      aspirin 81 MG EC tablet Take 1 tablet (81 mg total) by mouth daily. Swallow whole. 90 tablet 3   clotrimazole (LOTRIMIN) 1 % cream APPLY TWICE DAILY 30 g 2   cloZAPine (CLOZARIL) 100 MG tablet Take 2 tablets (200 mg total) by mouth 2 (two) times daily. 120 tablet 0   linaclotide (LINZESS) 290 MCG CAPS capsule Take 1 capsule (290 mcg total) by mouth daily before breakfast. 30 capsule 11   Melatonin 10 MG TABS Take 10 mg by mouth at bedtime.      metoCLOPramide (REGLAN) 5 MG tablet Take 1 tablet (5 mg total) by mouth 2 (two) times daily. 60 tablet 11   MYRBETRIQ 50 MG TB24 tablet Take 50 mg by mouth daily.     omeprazole (PRILOSEC) 40 MG capsule TAKE ONE CAPSULE EVERY EVENING 90 capsule 2   PARoxetine (PAXIL) 40 MG tablet Take 40 mg by mouth every evening.      polyethylene glycol (MIRALAX / GLYCOLAX) packet Take 17 g by mouth daily. 14 each 0   polyethylene glycol powder (GLYCOLAX/MIRALAX) 17 GM/SCOOP powder 17G IN 8OZ OF FLUID OF CHOICE MONDAY ANDTHURSDAY 510 g 1   senna-docusate (SENOKOT-S) 8.6-50 MG tablet Take 1 tablet by mouth 2 (two) times daily.  tamsulosin (FLOMAX) 0.4 MG CAPS capsule Take 0.4 mg by mouth daily.     zolpidem (AMBIEN) 10 MG tablet Take 10 mg by mouth at bedtime.     No facility-administered medications prior to visit.   No Known Allergies    Objective:   Today's Vitals   08/11/21 0929  BP: 124/80  Pulse: 82  Temp: (!) 97.5 F (36.4 C)  TempSrc: Temporal  SpO2: 95%  Weight: 216 lb 3.2 oz (98.1 kg)  Height: 6\' 2"  (1.88 m)   Body mass index is 27.76 kg/m.   General: Well developed, well nourished. No acute distress. Psych: Alert and oriented. Normal mood and affect.  Health Maintenance Due  Topic Date Due   Zoster Vaccines- Shingrix  (1 of 2) Never done   Pneumococcal Vaccine 71-30 Years old (2 - PCV) 04/26/2013   COVID-19 Vaccine (4 - Booster for Pfizer series) 11/21/2020   INFLUENZA VACCINE  05/22/2021     Assessment & Plan:   1. Constipation due to slow transit Stable on current regimen. I will extend his follow-ups out to q 4 months.  2. Gastroesophageal reflux disease, unspecified whether esophagitis present Stable on Prilosec.  3. Intellectual disability 4. Schizophrenia, unspecified type ([place]) 5. Anxiety and depression Continue to follow with Dr. Darleene Cleaver.  Jordan Salter, MD

## 2021-09-01 ENCOUNTER — Other Ambulatory Visit: Payer: Self-pay | Admitting: Family Medicine

## 2021-09-25 ENCOUNTER — Telehealth: Payer: Self-pay | Admitting: Family Medicine

## 2021-09-25 NOTE — Telephone Encounter (Signed)
Left message for patient to call back to schedule Medicare Annual Wellness Visit   No hx of AWV eligible as of 02/20/15  Please schedule at anytime with [facility] Ad[facility] if patient calls the office back.    39 Minutes appointment   Any questions, please call me at [phone]

## 2021-10-03 ENCOUNTER — Ambulatory Visit: Payer: Medicare Other

## 2021-10-11 ENCOUNTER — Ambulatory Visit (INDEPENDENT_AMBULATORY_CARE_PROVIDER_SITE_OTHER): Payer: Medicare Other

## 2021-10-11 DIAGNOSIS — Z0000 Encounter for general adult medical examination without abnormal findings: Secondary | ICD-10-CM | POA: Diagnosis not present

## 2021-10-11 NOTE — Patient Instructions (Signed)
Jordan Kennedy , Thank you for taking time to come for your Medicare Wellness Visit. I appreciate your ongoing commitment to your health goals. Please review the following plan we discussed and let me know if I can assist you in the future.   Screening recommendations/referrals: Colonoscopy: 12/02/2015 Recommended yearly ophthalmology/optometry visit for glaucoma screening and checkup Recommended yearly dental visit for hygiene and checkup  Vaccinations: Influenza vaccine: completed  Pneumococcal vaccine: completed  Tdap vaccine: 05/02/2021 Shingles vaccine: will consider     Advanced directives: none   Conditions/risks identified: none   Next appointment: none   Preventive Care 63 Years and Older, Male Preventive care refers to lifestyle choices and visits with your health care provider that can promote health and wellness. What does preventive care include? A yearly physical exam. This is also called an annual well check. Dental exams once or twice a year. Routine eye exams. Ask your health care provider how often you should have your eyes checked. Personal lifestyle choices, including: Daily care of your teeth and gums. Regular physical activity. Eating a healthy diet. Avoiding tobacco and drug use. Limiting alcohol use. Practicing safe sex. Taking low doses of aspirin every day. Taking vitamin and mineral supplements as recommended by your health care provider. What happens during an annual well check? The services and screenings done by your health care provider during your annual well check will depend on your age, overall health, lifestyle risk factors, and family history of disease. Counseling  Your health care provider may ask you questions about your: Alcohol use. Tobacco use. Drug use. Emotional well-being. Home and relationship well-being. Sexual activity. Eating habits. History of falls. Memory and ability to understand (cognition). Work and work  Statistician. Screening  You may have the following tests or measurements: Height, weight, and BMI. Blood pressure. Lipid and cholesterol levels. These may be checked every 5 years, or more frequently if you are over 63 years old. Skin check. Lung cancer screening. You may have this screening every year starting at age 63 if you have a 30-pack-year history of smoking and currently smoke or have quit within the past 15 years. Fecal occult blood test (FOBT) of the stool. You may have this test every year starting at age 63 Flexible sigmoidoscopy or colonoscopy. You may have a sigmoidoscopy every 5 years or a colonoscopy every 10 years starting at age 63 Prostate cancer screening. Recommendations will vary depending on your family history and other risks. Hepatitis C blood test. Hepatitis B blood test. Sexually transmitted disease (STD) testing. Diabetes screening. This is done by checking your blood sugar (glucose) after you have not eaten for a while (fasting). You may have this done every 1-3 years. Abdominal aortic aneurysm (AAA) screening. You may need this if you are a current or former smoker. Osteoporosis. You may be screened starting at age 63 if you are at high risk. Talk with your health care provider about your test results, treatment options, and if necessary, the need for more tests. Vaccines  Your health care provider may recommend certain vaccines, such as: Influenza vaccine. This is recommended every year. Tetanus, diphtheria, and acellular pertussis (Tdap, Td) vaccine. You may need a Td booster every 10 years. Zoster vaccine. You may need this after age 63 Pneumococcal 13-valent conjugate (PCV13) vaccine. One dose is recommended after age 13. Pneumococcal polysaccharide (PPSV23) vaccine. One dose is recommended after age 63 Talk to your health care provider about which screenings and vaccines you need and how often you  need them. This information is not intended to replace  advice given to you by your health care provider. Make sure you discuss any questions you have with your health care provider. Document Released: 11/04/2015 Document Revised: 06/27/2016 Document Reviewed: 08/09/2015 Elsevier Interactive Patient Education  2017 [place] Prevention in the Home Falls can cause injuries. They can happen to people of all ages. There are many things you can do to make your home safe and to help prevent falls. What can I do on the outside of my home? Regularly fix the edges of walkways and driveways and fix any cracks. Remove anything that might make you trip as you walk through a door, such as a raised step or threshold. Trim any bushes or trees on the path to your home. Use bright outdoor lighting. Clear any walking paths of anything that might make someone trip, such as rocks or tools. Regularly check to see if handrails are loose or broken. Make sure that both sides of any steps have handrails. Any raised decks and porches should have guardrails on the edges. Have any leaves, snow, or ice cleared regularly. Use sand or salt on walking paths during winter. Clean up any spills in your garage right away. This includes oil or grease spills. What can I do in the bathroom? Use night lights. Install grab bars by the toilet and in the tub and shower. Do not use towel bars as grab bars. Use non-skid mats or decals in the tub or shower. If you need to sit down in the shower, use a plastic, non-slip stool. Keep the floor dry. Clean up any water that spills on the floor as soon as it happens. Remove soap buildup in the tub or shower regularly. Attach bath mats securely with double-sided non-slip rug tape. Do not have throw rugs and other things on the floor that can make you trip. What can I do in the bedroom? Use night lights. Make sure that you have a light by your bed that is easy to reach. Do not use any sheets or blankets that are too big for your bed.  They should not hang down onto the floor. Have a firm chair that has side arms. You can use this for support while you get dressed. Do not have throw rugs and other things on the floor that can make you trip. What can I do in the kitchen? Clean up any spills right away. Avoid walking on wet floors. Keep items that you use a lot in easy-to-reach places. If you need to reach something above you, use a strong step stool that has a grab bar. Keep electrical cords out of the way. Do not use floor polish or wax that makes floors slippery. If you must use wax, use non-skid floor wax. Do not have throw rugs and other things on the floor that can make you trip. What can I do with my stairs? Do not leave any items on the stairs. Make sure that there are handrails on both sides of the stairs and use them. Fix handrails that are broken or loose. Make sure that handrails are as long as the stairways. Check any carpeting to make sure that it is firmly attached to the stairs. Fix any carpet that is loose or worn. Avoid having throw rugs at the top or bottom of the stairs. If you do have throw rugs, attach them to the floor with carpet tape. Make sure that you have a light switch  at the top of the stairs and the bottom of the stairs. If you do not have them, ask someone to add them for you. What else can I do to help prevent falls? Wear shoes that: Do not have high heels. Have rubber bottoms. Are comfortable and fit you well. Are closed at the toe. Do not wear sandals. If you use a stepladder: Make sure that it is fully opened. Do not climb a closed stepladder. Make sure that both sides of the stepladder are locked into place. Ask someone to hold it for you, if possible. Clearly mark and make sure that you can see: Any grab bars or handrails. First and last steps. Where the edge of each step is. Use tools that help you move around (mobility aids) if they are needed. These  include: Canes. Walkers. Scooters. Crutches. Turn on the lights when you go into a dark area. Replace any light bulbs as soon as they burn out. Set up your furniture so you have a clear path. Avoid moving your furniture around. If any of your floors are uneven, fix them. If there are any pets around you, be aware of where they are. Review your medicines with your doctor. Some medicines can make you feel dizzy. This can increase your chance of falling. Ask your doctor what other things that you can do to help prevent falls. This information is not intended to replace advice given to you by your health care provider. Make sure you discuss any questions you have with your health care provider. Document Released: 08/04/2009 Document Revised: 03/15/2016 Document Reviewed: 11/12/2014 Elsevier Interactive Patient Education  2017 [employer].

## 2021-10-11 NOTE — Progress Notes (Signed)
Subjective:   Jordan Kennedy is a 65 y.o. male who presents for an Subsequent  Medicare Annual Wellness Visit.  I connected with Jordan Kennedy today by telephone and verified that I am speaking with the correct person using two identifiers. Location patient: home Location provider: work Persons participating in the virtual visit: patient, provider.   I discussed the limitations, risks, security and privacy concerns of performing an evaluation and management service by telephone and the availability of in person appointments. I also discussed with the patient that there may be a patient responsible charge related to this service. The patient expressed understanding and verbally consented to this telephonic visit.    Interactive audio and video telecommunications were attempted between this provider and patient, however failed, due to patient having technical difficulties OR patient did not have access to video capability.  We continued and completed visit with audio only.    Review of Systems     Cardiac Risk Factors include: advanced age (>28men, >32 women)     Objective:    Today's Vitals   There is no height or weight on file to calculate BMI.  Advanced Directives 07/10/2018 07/10/2018 07/07/2018 07/06/2018 07/05/2018 06/29/2018 03/14/2017  Does Patient Have a Medical Advance Directive? Yes Yes Yes Yes Yes No No  Type of Advance Directive Healthcare Power of [place]  Does patient want to make changes to medical advance directive? No - Patient declined - - - - - -  Copy of [place] in Chart? No - copy requested - - - - - -  Would patient like information on creating a medical advance directive? - - - - - - No - Patient declined    Current Medications (verified) Outpatient Encounter Medications as of 10/11/2021  Medication Sig   amantadine (SYMMETREL) 100 MG capsule Take 100 mg by mouth at bedtime.    aspirin 81 MG EC tablet  Take 1 tablet (81 mg total) by mouth daily. Swallow whole.   clotrimazole (LOTRIMIN) 1 % cream APPLY TWICE DAILY   cloZAPine (CLOZARIL) 100 MG tablet Take 2 tablets (200 mg total) by mouth 2 (two) times daily.   linaclotide (LINZESS) 290 MCG CAPS capsule Take 1 capsule (290 mcg total) by mouth daily before breakfast.   Melatonin 10 MG TABS Take 10 mg by mouth at bedtime.    metoCLOPramide (REGLAN) 5 MG tablet Take 1 tablet (5 mg total) by mouth 2 (two) times daily.   MYRBETRIQ 50 MG TB24 tablet Take 50 mg by mouth daily.   omeprazole (PRILOSEC) 40 MG capsule TAKE ONE CAPSULE EVERY EVENING   PARoxetine (PAXIL) 40 MG tablet Take 40 mg by mouth every evening.    polyethylene glycol (MIRALAX / GLYCOLAX) packet Take 17 g by mouth daily.   polyethylene glycol powder (GLYCOLAX/MIRALAX) 17 GM/SCOOP powder 17G IN 8OZ OF FLUID OF CHOICE MONDAY ANDTHURSDAY   SENNA PLUS 8.6-50 MG tablet TAKE ONE TABLET TWICE DAILY   tamsulosin (FLOMAX) 0.4 MG CAPS capsule Take 0.4 mg by mouth daily.   zolpidem (AMBIEN) 10 MG tablet Take 10 mg by mouth at bedtime.   No facility-administered encounter medications on file as of 10/11/2021.    Allergies (verified) Patient has no known allergies.   History: Past Medical History:  Diagnosis Date   AKI (acute kidney injury) ([place]) 06/29/2018   Anemia    Anemia, iron deficiency 09/18/2015   Bowel obstruction ([facility])    BPH (benign  prostatic hyperplasia)    Chronic constipation    History of ileus    Ileus ([place]) 03/07/2017   Mental retardation    Partial bowel obstruction ([place])    Schizophrenia ([place])    Monarche every three months;    SIRS (systemic inflammatory response syndrome) ([place]) 06/29/2018   Small bowel obstruction ([place]) 06/29/2018   History reviewed. No pertinent surgical history. Family History  Problem Relation Age of Onset   Cancer Mother        unknown type   Social History   Socioeconomic History   Marital status: Single    Spouse name: Not on file    Number of children: Not on file   Years of education: Not on file   Highest education level: Not on file  Occupational History   Not on file  Tobacco Use   Smoking status: Every Day    Packs/day: 0.25    Years: 30.00    Pack years: 7.50    Types: Cigarettes   Smokeless tobacco: Never  Vaping Use   Vaping Use: Never used  Substance and Sexual Activity   Alcohol use: No   Drug use: No   Sexual activity: Never  Other Topics Concern   Not on file  Social History Narrative   Marital status: single      Children: none       Employment: disability due to mental retardation and schizophrenia.      Lives: group home [place]; [facility]; 3 men in group home.        Tobacco: six cigarettes per day; since age 64.      Alcohol:  None      Drugs: none      Exercise:  Walking some.      Seatbelt:  100%   Social Determinants of Radio broadcast assistant Strain: Low Risk    Difficulty of Paying Living Expenses: Not hard at all  Food Insecurity: No Food Insecurity   Worried About Charity fundraiser in the Last Year: Never true   Arboriculturist in the Last Year: Never true  Transportation Needs: Not on file  Physical Activity: Inactive   Days of Exercise per Week: 0 days   Minutes of Exercise per Session: 0 min  Stress: No Stress Concern Present   Feeling of Stress : Not at all  Social Connections: Socially Isolated   Frequency of Communication with Friends and Family: Twice a week   Frequency of Social Gatherings with Friends and Family: Twice a week   Attends Religious Services: Never   Marine scientist or Organizations: No   Attends Music therapist: Never   Marital Status: Never married    Tobacco Counseling Ready to quit: Not Answered Counseling given: Not Answered   Clinical Intake:  Pre-visit preparation completed: Yes  Pain : No/denies pain     Nutritional Risks: None Diabetes: No  How often do you need to  have someone help you when you read instructions, pamphlets, or other written materials from your doctor or pharmacy?: 2 - Rarely What is the last grade level you completed in school?: 11th grade  Diabetic?no   Interpreter Needed?: No  Information entered by :: L.Alieah Brinton,LPN   Activities of Daily Living In your present state of health, do you have any difficulty performing the following activities: 10/11/2021  Hearing? N  Vision? N  Difficulty concentrating or making decisions? Y  Walking or  climbing stairs? N  Dressing or bathing? N  Doing errands, shopping? N  Preparing Food and eating ? N  Using the Toilet? N  In the past six months, have you accidently leaked urine? N  Do you have problems with loss of bowel control? N  Managing your Medications? N  Managing your Finances? N  Housekeeping or managing your Housekeeping? N  Some recent data might be hidden    Patient Care Team: Haydee Salter, MD as PCP - General (Family Medicine) Ladene Artist, MD as Consulting Physician (Gastroenterology) Corena Pilgrim, MD as Consulting Physician (Psychiatry) Ardis Hughs, MD as Attending Physician (Urology)  Indicate any recent Medical Services you may have received from other than [facility] providers in the past year (date may be approximate).     Assessment:   This is a routine wellness examination for Jordan Kennedy.  Hearing/Vision screen Vision Screening - Comments:: Annual eye exams wears glasses   Dietary issues and exercise activities discussed: Current Exercise Habits: The patient does not participate in regular exercise at present   Goals Addressed   None    Depression Screen PHQ 2/9 Scores 10/11/2021 10/11/2021 01/31/2021 08/29/2015 05/25/2015  PHQ - 2 Score 0 0 0 0 0    Fall Risk Fall Risk  10/11/2021 08/29/2015 05/25/2015  Falls in the past year? 0 No No  Number falls in past yr: 0 - -  Injury with Fall? 0 - -  Follow up Falls evaluation completed - -    FALL  RISK PREVENTION PERTAINING TO THE HOME:  Any stairs in or around the home? Yes  If so, are there any without handrails? No  Home free of loose throw rugs in walkways, pet beds, electrical cords, etc? Yes  Adequate lighting in your home to reduce risk of falls? Yes   ASSISTIVE DEVICES UTILIZED TO PREVENT FALLS:  Life alert? No  Use of a cane, walker or w/c? No  Grab bars in the bathroom? No  Shower chair or bench in shower? No  Elevated toilet seat or a handicapped toilet? No    Cognitive Function:  Normal cognitive status assessed by direct observation by this Nurse Health Advisor. No abnormalities found.        Immunizations Immunization History  Administered Date(s) Administered   Influenza,inj,Quad PF,6+ Mos 08/28/2014, 06/20/2015, 08/11/2021   PFIZER Comirnaty(Gray Top)Covid-19 Tri-Sucrose Vaccine 11/04/2019, 11/25/2019, 09/26/2020   Pneumococcal Polysaccharide-23 03/30/2009, 04/26/2012   Tdap 03/30/2009, 05/02/2021    TDAP status: Up to date  Flu Vaccine status: Up to date  Pneumococcal vaccine status: Up to date  Covid-19 vaccine status: Completed vaccines  Qualifies for Shingles Vaccine? Yes   Zostavax completed No   Shingrix Completed?: No.    Education has been provided regarding the importance of this vaccine. Patient has been advised to call insurance company to determine out of pocket expense if they have not yet received this vaccine. Advised may also receive vaccine at local pharmacy or Health Dept. Verbalized acceptance and understanding.  Screening Tests Health Maintenance  Topic Date Due   Zoster Vaccines- Shingrix (1 of 2) Never done   Pneumococcal Vaccine 63-68 Years old (2 - PCV) 04/26/2013   COVID-19 Vaccine (4 - Booster for Pfizer series) 11/21/2020   COLONOSCOPY (Pts 45-58yrs Insurance coverage will need to be confirmed)  12/01/2025   TETANUS/TDAP  05/03/2031   INFLUENZA VACCINE  Completed   Hepatitis C Screening  Completed   HIV Screening   Completed   HPV VACCINES  Aged Out    Health Maintenance  Health Maintenance Due  Topic Date Due   Zoster Vaccines- Shingrix (1 of 2) Never done   Pneumococcal Vaccine 56-53 Years old (2 - PCV) 04/26/2013   COVID-19 Vaccine (4 - Booster for Pfizer series) 11/21/2020    Colorectal cancer screening: Type of screening: Colonoscopy. Completed 02/017/2017. Repeat every 10 years  Lung Cancer Screening: (Low Dose CT Chest recommended if Age 40-80 years, 30 pack-year currently smoking OR have quit w/in 15years.) does not qualify.   Lung Cancer Screening Referral: n/a  Additional Screening:  Hepatitis C Screening: does not qualify; Completed 01/31/2021  Vision Screening: Recommended annual ophthalmology exams for early detection of glaucoma and other disorders of the eye. Is the patient up to date with their annual eye exam?  Yes  Who is the provider or what is the name of the office in which the patient attends annual eye exams? Dr.Groat  If pt is not established with a provider, would they like to be referred to a provider to establish care? No .   Dental Screening: Recommended annual dental exams for proper oral hygiene  Community Resource Referral / Chronic Care Management: CRR required this visit?  No   CCM required this visit?  No      Plan:     I have personally reviewed and noted the following in the patients chart:   Medical and social history Use of alcohol, tobacco or illicit drugs  Current medications and supplements including opioid prescriptions. Patient is not currently taking opioid prescriptions. Functional ability and status Nutritional status Physical activity Advanced directives List of other physicians Hospitalizations, surgeries, and ER visits in previous 12 months Vitals Screenings to include cognitive, depression, and falls Referrals and appointments  In addition, I have reviewed and discussed with patient certain preventive protocols, quality  metrics, and best practice recommendations. A written personalized care plan for preventive services as well as general preventive health recommendations were provided to patient.     Randel Pigg, LPN   [id number]   Nurse Notes: none

## 2021-11-09 ENCOUNTER — Telehealth: Payer: Self-pay | Admitting: Family Medicine

## 2021-11-09 NOTE — Telephone Encounter (Signed)
Jordan Kennedy dropped off a form for Dr. Gena Fray to fill out. I have placed it in folder up front. Please call Jordan Kennedy at [phone] when complete.

## 2021-11-09 NOTE — Telephone Encounter (Signed)
Form placed on providers desk to be filled out.  Dm/cma

## 2021-11-10 NOTE — Telephone Encounter (Signed)
Form signed and called pts caretaker, [name], to advise.  Dm/cma

## 2021-11-17 ENCOUNTER — Other Ambulatory Visit: Payer: Self-pay

## 2021-11-17 ENCOUNTER — Ambulatory Visit: Payer: Medicare Other | Admitting: Family Medicine

## 2021-11-20 ENCOUNTER — Ambulatory Visit (INDEPENDENT_AMBULATORY_CARE_PROVIDER_SITE_OTHER): Payer: Medicare Other | Admitting: Family Medicine

## 2021-11-20 ENCOUNTER — Encounter: Payer: Self-pay | Admitting: Family Medicine

## 2021-11-20 ENCOUNTER — Other Ambulatory Visit: Payer: Self-pay

## 2021-11-20 VITALS — BP 110/60 | HR 96 | Temp 97.5°F | Ht 74.0 in | Wt 210.6 lb

## 2021-11-20 DIAGNOSIS — F209 Schizophrenia, unspecified: Secondary | ICD-10-CM | POA: Diagnosis not present

## 2021-11-20 DIAGNOSIS — F32A Depression, unspecified: Secondary | ICD-10-CM

## 2021-11-20 DIAGNOSIS — K219 Gastro-esophageal reflux disease without esophagitis: Secondary | ICD-10-CM

## 2021-11-20 DIAGNOSIS — B351 Tinea unguium: Secondary | ICD-10-CM | POA: Diagnosis not present

## 2021-11-20 DIAGNOSIS — G2401 Drug induced subacute dyskinesia: Secondary | ICD-10-CM | POA: Insufficient documentation

## 2021-11-20 DIAGNOSIS — K5901 Slow transit constipation: Secondary | ICD-10-CM

## 2021-11-20 DIAGNOSIS — F419 Anxiety disorder, unspecified: Secondary | ICD-10-CM

## 2021-11-20 NOTE — Progress Notes (Signed)
[facility] [address] [place] [address] Dept: [phone] Dept Fax: [phone]  Chronic Care Office Visit  Subjective:    Patient ID: Jordan Kennedy, male    DOB: 11/08/1955, 66 y.o..   MRN: [id number]  Chief Complaint  Patient presents with   Follow-up    4 month f/u     History of Present Illness:  Patient is in today for reassessment of chronic medical issues. Jordan Kennedy presents with his case worker, [facility]. He currently lives in a group home.   Jordan Kennedy has a history of schizophrenia, anxiety with depression, and intellectual disability. He is currently followed by Dr. Darleene Cleaver (psychiatry). He is managed on clozapine, paroxetine, and amantadine.    Jordan Kennedy has insomnia issues and is provide Ambien and melatonin.   Jordan Kennedy has a history of constipation issues related to slow transit and prior history of bowel obstruction. He does have linaclotide (Linzess), Miralax, and senna-docusate available for management of this. He notes his bowels are moving regularly at this point.    Jordan Kennedy has a history of bladder outlet issues. He is currently managed on Flomax and Mybetriq. He is urinating well at this point without complaints.   Jordan Kennedy has a history of GERD. He is managed well on omeprazole. He has PRN Reglan if needed. He notes he is eating well without complaints.  Further ROS not completed, as patient is a poor historian based on his intellectual capacity.  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 No pertinent surgical history.  Family History  Problem Relation Age of Onset   Cancer Mother        unknown type   Outpatient Medications Prior to Visit  Medication Sig Dispense Refill   amantadine (SYMMETREL) 100 MG capsule Take 100 mg by mouth at bedtime.      aspirin 81 MG EC tablet Take 1 tablet (81 mg total) by mouth daily. Swallow whole. 90 tablet 3   clotrimazole (LOTRIMIN) 1 % cream APPLY TWICE DAILY 30 g 2   cloZAPine (CLOZARIL) 100 MG tablet Take 2 tablets (200 mg total) by mouth 2 (two) times daily. 120 tablet 0   linaclotide (LINZESS) 290 MCG CAPS capsule Take 1 capsule (290 mcg total) by mouth daily before breakfast. 30 capsule 11   Melatonin 10 MG TABS Take 10 mg by mouth at bedtime.      metoCLOPramide (REGLAN) 5 MG tablet Take 1 tablet (5 mg total) by mouth 2 (two) times daily. 60 tablet 11   MYRBETRIQ 50 MG TB24 tablet Take 50 mg by mouth daily.     omeprazole (PRILOSEC) 40 MG capsule TAKE ONE CAPSULE EVERY EVENING 90 capsule 2   PARoxetine (PAXIL) 40 MG tablet Take 40 mg by mouth every evening.      polyethylene glycol (MIRALAX / GLYCOLAX) packet Take 17 g by mouth daily. 14 each 0   polyethylene glycol powder (GLYCOLAX/MIRALAX) 17 GM/SCOOP powder 17G IN 8OZ OF FLUID OF CHOICE MONDAY ANDTHURSDAY 510 g 1  SENNA PLUS 8.6-50 MG tablet TAKE ONE TABLET TWICE DAILY 180 tablet 1   tamsulosin (FLOMAX) 0.4 MG CAPS capsule Take 0.4 mg by mouth daily.     zolpidem (AMBIEN) 10 MG tablet Take 10 mg by mouth at bedtime.     No facility-administered medications prior to visit.   No Known Allergies    Objective:   Today's Vitals   11/20/21 1535  BP: 110/60  Pulse: 96  Temp: (!) 97.5 F (36.4 C)  TempSrc: Temporal  SpO2: 96%  Weight: 210 lb 9.6 oz (95.5 kg)  Height: 6\' 2"  (1.88 m)   Body mass index is 27.04 kg/m.   General: Well developed, well nourished. No acute distress. HEENT: Normocephalic, non-traumatic.  External ears normal. Left EAC impacted with wax. I used a   curette to remove some soft wax. TMs normal bilaterally. Pupils equal and round, but fairly constricted,   EOMI. Conjunctiva clear. Nose clear without congestion or rhinorrhea. Mucous membranes moist.   Oropharynx clear. Edentulous. Neck: Supple. No lymphadenopathy. No thyromegaly. Lungs: Clear to auscultation bilaterally. No wheezing, rales or rhonchi. CV: RRR without murmurs or rubs. Pulses 2+ bilaterally. Abdomen: Soft, non-tender. Bowel sounds positive, normal pitch and frequency. No   hepatosplenomegaly. No rebound or guarding. Mild diastasis of abdomen. Back: Straight. No CVA tenderness bilaterally. Extremities: Full ROM. No joint swelling or tenderness. No edema noted. Skin: Warm and dry. No rashes. Great toe nails are thickened with yellow discoloration. Neuro: Mild to moderate chewing motion of mouth. Moderate pinching motions of fingers. Mildly ataxic   gait. Psych: Alert and oriented. Normal mood and affect.  Health Maintenance Due  Topic Date Due   Zoster Vaccines- Shingrix (1 of 2) Never done   COVID-19 Vaccine (4 - Booster for Pfizer series) 11/21/2020     Assessment & Plan:   1. Constipation due to slow transit Stable on linaclotide (Linzess), Miralax, and senna-docusate as needed.  2. Gastroesophageal reflux disease, unspecified whether esophagitis present Well controlled with omeprazole and PRN Reglan.  3. Onychomycosis Nails are trimmed well.  4. Schizophrenia, unspecified type ([place]) 5. Tardive dyskinesia Jordan Kennedy exhibits some physical signs of tardive dyskinesia, likely secondary to his prior antipsychotic use. His current clozapine use, would be lower risk for this and he only occasionally uses Reglan. He has an upcoming in-person visit with his psychiatrist. He is having monthly labs done through [facility]. I asked [name] to try and obtain copies of these. Physical Form completed today.  6.  Anxiety and depression Stable on Paxil.  Jordan Salter, MD

## 2021-12-12 ENCOUNTER — Ambulatory Visit: Payer: Medicare Other | Admitting: Family Medicine

## 2022-01-10 ENCOUNTER — Telehealth: Payer: Self-pay | Admitting: Family Medicine

## 2022-01-10 NOTE — Telephone Encounter (Signed)
Pt caregiver left forms to be filled out. Placed in Dr Rudd's folder up front.  ?

## 2022-01-10 NOTE — Telephone Encounter (Signed)
Form placed on provider's desk. Dm/cma  

## 2022-01-11 NOTE — Telephone Encounter (Signed)
Called James(caretaker) notified that form is ready for pick up. Dm/cma ? ?

## 2022-01-24 ENCOUNTER — Other Ambulatory Visit: Payer: Self-pay | Admitting: Family Medicine

## 2022-01-24 DIAGNOSIS — K5901 Slow transit constipation: Secondary | ICD-10-CM

## 2022-02-07 ENCOUNTER — Telehealth: Payer: Self-pay | Admitting: Family Medicine

## 2022-02-07 NOTE — Telephone Encounter (Signed)
Form placed in folder for Dr Gena Fray to rtn and then he will sign.  Dm/cma ? ?

## 2022-02-07 NOTE — Telephone Encounter (Signed)
Pt dropped off form for Dr Gena Fray to sign and I did inform him that vDr Rudd wouldn't be back until 02/12/22. Form is in providers folder up front ?

## 2022-02-12 NOTE — Telephone Encounter (Signed)
Form completed and patients caretaker, [name] notified [facility] phone.  Placed up front for pick up.  Dm/cma ?  ?

## 2022-02-12 NOTE — Telephone Encounter (Signed)
Form picked up by pts guardian ?

## 2022-02-17 ENCOUNTER — Other Ambulatory Visit: Payer: Self-pay | Admitting: Family Medicine

## 2022-02-17 DIAGNOSIS — K219 Gastro-esophageal reflux disease without esophagitis: Secondary | ICD-10-CM

## 2022-02-20 ENCOUNTER — Ambulatory Visit (HOSPITAL_COMMUNITY)
Admission: EM | Admit: 2022-02-20 | Discharge: 2022-02-20 | Disposition: A | Discharge status: Home / Self Care | Payer: Medicare Other

## 2022-02-20 ENCOUNTER — Encounter (HOSPITAL_COMMUNITY): Payer: Self-pay

## 2022-02-20 DIAGNOSIS — M79661 Pain in right lower leg: Secondary | ICD-10-CM

## 2022-02-20 NOTE — ED Triage Notes (Signed)
Over the last week Pt has intermittently c/o right posterior knee and leg pain. Has been taking tylenol. No falls or injuries. ?

## 2022-02-20 NOTE — Discharge Instructions (Signed)
-   Please go to [facility] tomorrow 02/21/2022 at 8 am for the ultrasound of your right lower extremity ?- You can continue tylenol in the meantime to help with pain ?- If you develop shortness of breath or chest pain, please go to the Emergency Room ?

## 2022-02-20 NOTE — ED Provider Notes (Signed)
?[place] ? ? ? ?CSN: [id number] ?Arrival date & time: 02/20/22  1047 ? ? ?  ? ?History   ?Chief Complaint ?Chief Complaint  ?Patient presents with  ? Leg Pain  ?  right  ? ? ?HPI ?Jordan Kennedy is a 66 y.o. male.  ? ?Patient presents with right calf pain that has been going on for a few days.  Patient presents with caregiver and caregiver reports he has given him Tylenol couple of times which seems to help with the pain.  Caregiver denies any redness, swelling of the right calf, or discoloration.  Patient reports the pain is worse when he walks on it.  No recent fall, accident, injury, or trauma.  Denies any shortness of breath or chest pain.  Patient does take a daily baby aspirin, however no other blood thinners. ? ? ?Past Medical History:  ?Diagnosis Date  ? AKI (acute kidney injury) ([place]) 06/29/2018  ? Anemia   ? Anemia, iron deficiency 09/18/2015  ? Bowel obstruction ([place])   ? BPH (benign prostatic hyperplasia)   ? Chronic constipation   ? History of ileus   ? Ileus ([place]) 03/07/2017  ? Mental retardation   ? Partial bowel obstruction ([place])   ? Schizophrenia ([place])   ? Monarche every three months;   ? SIRS (systemic inflammatory response syndrome) ([place]) 06/29/2018  ? Small bowel obstruction ([place]) 06/29/2018  ? ? ?Patient Active Problem List  ? Diagnosis Date Noted  ? Tardive dyskinesia 11/20/2021  ? Lower urinary tract symptoms (LUTS) 05/02/2021  ? History of small bowel obstruction 01/31/2021  ? History of colon polyps 01/31/2021  ? Onychomycosis 01/31/2021  ? Gastroesophageal reflux disease 01/31/2021  ? Constipation due to slow transit 04/25/2012  ? Anxiety and depression 04/25/2012  ? 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 Schizophrenia ([place])   ? Intellectual disability   ? ? ?History reviewed. No pertinent surgical history. ? ? ? ? ?Home Medications   ? ?Prior to Admission medications   ?Medication Sig Start Date End Date Taking? Authorizing Provider  ?amantadine (SYMMETREL) 100 MG capsule Take 100 mg by mouth at  bedtime.     [provider]  ?aspirin 81 MG EC tablet Take 1 tablet (81 mg total) by mouth daily. Swallow whole. 05/18/21   Haydee Salter, MD  ?clotrimazole (LOTRIMIN) 1 % cream APPLY TWICE DAILY 02/22/21   Haydee Salter, MD  ?cloZAPine (CLOZARIL) 100 MG tablet Take 2 tablets (200 mg total) by mouth 2 (two) times daily. 03/12/17   Eugenie Filler, MD  ?[facility] MELATONIN 10 MG SUBL Take 1 tablet by mouth at bedtime. 02/17/22   [provider]  ?Rolan Lipa 290 MCG CAPS capsule TAKE ONE CAPSULE EACH DAY BEFORE BREAKFAST 01/24/22   Haydee Salter, MD  ?Melatonin 10 MG TABS Take 10 mg by mouth at bedtime.     [provider]  ?metoCLOPramide (REGLAN) 5 MG tablet Take 1 tablet (5 mg total) by mouth 2 (two) times daily. 02/10/21   Haydee Salter, MD  ?MYRBETRIQ 50 MG TB24 tablet Take 50 mg by mouth daily. 01/06/21   [provider]  ?omeprazole (PRILOSEC) 40 MG capsule TAKE ONE CAPSULE EVERY EVENING 02/19/22   Haydee Salter, MD  ?PARoxetine (PAXIL) 40 MG tablet Take 40 mg by mouth every evening.     [provider]  ?polyethylene glycol (MIRALAX / GLYCOLAX) packet Take 17 g by mouth daily. 07/10/18   Kayleen Memos, DO  ?polyethylene glycol powder (GLYCOLAX/MIRALAX)  17 GM/SCOOP powder 17G IN 8OZ OF FLUID OF CHOICE MONDAY ANDTHURSDAY 08/03/21   Haydee Salter, MD  ?SENNA PLUS 8.6-50 MG tablet TAKE ONE TABLET TWICE DAILY 09/01/21   Haydee Salter, MD  ?tamsulosin (FLOMAX) 0.4 MG CAPS capsule Take 0.4 mg by mouth daily. 01/10/21   [provider]  ?zolpidem (AMBIEN) 10 MG tablet Take 10 mg by mouth at bedtime.    [provider]  ? ? ?Family History ?Family History  ?Problem Relation Age of Onset  ? Cancer Mother   ?     unknown type  ? ? ?Social History ?Social History  ? ?Tobacco Use  ? Smoking status: Every Day  ?  Packs/day: 0.25  ?  Years: 30.00  ?  Pack years: 7.50  ?  Types: Cigarettes  ? Smokeless tobacco: Never  ?Vaping Use  ? Vaping Use: Never used  ?Substance  Use Topics  ? Alcohol use: No  ? Drug use: No  ? ? ? ?Allergies   ?Patient has no known allergies. ? ? ?Review of Systems ?Review of Systems ?Per HPI ? ?Physical Exam ?Triage Vital Signs ?ED Triage Vitals [02/20/22 1159]  ?Enc Vitals Group  ?   BP (!) 103/55  ?   Pulse Rate (!) 116  ?   Resp 18  ?   Temp 99.4 ?F (37.4 ?C)  ?   Temp Source Oral  ?   SpO2 98 %  ?   Weight   ?   Height   ?   Head Circumference   ?   Peak Flow   ?   Pain Score   ?   Pain Loc   ?   Pain Edu?   ?   Excl. in [place]?   ? ?No data found. ? ?Updated Vital Signs ?BP (!) 103/55 (BP Location: Left Arm)   Pulse (!) 116   Temp 99.4 ?F (37.4 ?C) (Oral)   Resp 18   SpO2 98%  ? ?HR recheck 103 ? ?Visual Acuity ?Right Eye Distance:   ?Left Eye Distance:   ?Bilateral Distance:   ? ?Right Eye Near:   ?Left Eye Near:    ?Bilateral Near:    ? ?Physical Exam ?Vitals and nursing note reviewed.  ?Constitutional:   ?   General: He is not in acute distress. ?   Appearance: Normal appearance. He is not toxic-appearing.  ?Eyes:  ?   General: No scleral icterus. ?   Extraocular Movements: Extraocular movements intact.  ?Cardiovascular:  ?   Rate and Rhythm: Regular rhythm. Tachycardia present.  ?Pulmonary:  ?   Effort: Pulmonary effort is normal. No respiratory distress.  ?   Breath sounds: Normal breath sounds. No wheezing, rhonchi or rales.  ?Musculoskeletal:     ?   General: Normal range of motion.  ?   Right lower leg: Tenderness present. No bony tenderness. No edema.  ?   Left lower leg: No tenderness or bony tenderness. No edema.  ?   Right ankle: Normal pulse.  ?   Left ankle: Normal pulse.  ?   Right foot: Normal capillary refill. Normal pulse.  ?   Left foot: Normal capillary refill. Normal pulse.  ?   Comments: Tender to palpation of left calf.  No obvious warmth, redness, swelling, deformity.  Calf circumference approximately 37 cm bilaterally.  Ankle circumference approximately 18 cm bilaterally. ? ?Positive Homan's sign right lower extremity   ?Skin: ?   General: Skin is warm  and dry.  ?   Capillary Refill: Capillary refill takes less than 2 seconds.  ?   Coloration: Skin is not jaundiced or pale.  ?   Findings: No erythema.  ?Neurological:  ?   Mental Status: He is alert and oriented to person, place, and time.  ?   Motor: No weakness.  ?   Gait: Gait normal.  ?Psychiatric:     ?   Behavior: Behavior is cooperative.  ? ? ? ?UC Treatments / Results  ?Labs ?(all labs ordered are listed, but only abnormal results are displayed) ?Labs Reviewed - No data to display ? ?EKG ? ? ?Radiology ?No results found. ? ?Procedures ?Procedures (including critical care time) ? ?Medications Ordered in UC ?Medications - No data to display ? ?Initial Impression / Assessment and Plan / UC Course  ?I have reviewed the triage vital signs and the nursing notes. ? ?Pertinent labs & imaging results that were available during my care of the patient were reviewed by me and considered in my medical decision making (see chart for details). ? ?  ?Given right calf pain with positive Homan's sign, will obtain vascular ultrasound imaging of right lower extremity.  My suspicion is low given unremarkable examination, however to want to rule out.  Encouraged continued Tylenol and stretching in the meantime.  With any new chest pain or shortness of breath, go to ER. ?Final Clinical Impressions(s) / UC Diagnoses  ? ?Final diagnoses:  ?Right calf pain  ? ? ? ?Discharge Instructions   ? ?  ?- Please go to [facility] tomorrow 02/21/2022 at 8 am for the ultrasound of your right lower extremity ?- You can continue tylenol in the meantime to help with pain ?- If you develop shortness of breath or chest pain, please go to the Emergency Room ? ? ? ? ?ED Prescriptions   ?None ?  ? ?PDMP not reviewed this encounter. ?  ?Eulogio Bear, NP ?02/20/22 1420 ? ?

## 2022-02-21 ENCOUNTER — Other Ambulatory Visit (HOSPITAL_COMMUNITY): Payer: Self-pay | Admitting: Nurse Practitioner

## 2022-02-21 ENCOUNTER — Ambulatory Visit (HOSPITAL_COMMUNITY)
Admission: RE | Admit: 2022-02-21 | Discharge: 2022-02-21 | Disposition: A | Discharge status: Home / Self Care | Payer: Medicare Other | Source: Ambulatory Visit | Attending: Nurse Practitioner | Admitting: Nurse Practitioner

## 2022-02-21 DIAGNOSIS — M79604 Pain in right leg: Secondary | ICD-10-CM | POA: Diagnosis present

## 2022-02-21 DIAGNOSIS — M7989 Other specified soft tissue disorders: Secondary | ICD-10-CM | POA: Diagnosis present

## 2022-02-21 NOTE — Progress Notes (Signed)
Lower extremity venous has been completed.  ? ?Preliminary results in CV Proc.  ? ?Kinslee Dalpe Jasmene Goswami ?02/21/2022 8:32 AM    ?

## 2022-02-27 ENCOUNTER — Other Ambulatory Visit: Payer: Self-pay | Admitting: Family Medicine

## 2022-03-08 ENCOUNTER — Other Ambulatory Visit: Payer: Self-pay | Admitting: Family Medicine

## 2022-03-08 DIAGNOSIS — K5901 Slow transit constipation: Secondary | ICD-10-CM

## 2022-03-20 ENCOUNTER — Ambulatory Visit (INDEPENDENT_AMBULATORY_CARE_PROVIDER_SITE_OTHER): Payer: Medicare Other | Admitting: Family Medicine

## 2022-03-20 ENCOUNTER — Encounter: Payer: Self-pay | Admitting: Family Medicine

## 2022-03-20 VITALS — BP 118/68 | HR 102 | Temp 98.4°F | Ht 74.0 in | Wt 205.8 lb

## 2022-03-20 DIAGNOSIS — S86811D Strain of other muscle(s) and tendon(s) at lower leg level, right leg, subsequent encounter: Secondary | ICD-10-CM

## 2022-03-20 MED ORDER — NAPROXEN 500 MG PO TABS: 500.0000 mg | ORAL_TABLET | Freq: Two times a day (BID) | ORAL | 0 refills | Status: DC

## 2022-03-20 NOTE — Progress Notes (Signed)
[facility] [address] [place] [address] Dept: [phone] Dept Fax: [phone]  Office Visit  Subjective:    Patient ID: Jordan Kennedy, male    DOB: May 29, 1956, 66 y.o..   MRN: [id number]  Chief Complaint  Patient presents with   Acute Visit    C/o having Rt leg calf pain x 2 weeks.   Sleeping issues.     History of Present Illness:  Patient is in today for reassessment of right calf pain. He is accompanied by Jordan Kennedy, caretaker from his group home.  Jordan Kennedy was seen on 5/2 at [facility] with a several day history of right calf pain. He reported at that time that he had no history of a fall, injury, or other trauma to the leg. He was having no other associated symptoms. He had an ultrasound performed which did not show any sign of a DVT.  Jordan Kennedy continues to complain of right calf pain. Despite this, Jordan Kennedy has noted that he runs up and down stairs without a sign of a problem. Jordan Kennedy now says he had a fall, but it is unclear if this is true due to his intellectual disability.  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  Family History  Problem Relation Age of Onset   Cancer Mother        unknown type   Outpatient Medications Prior to Visit  Medication Sig Dispense Refill   amantadine (SYMMETREL) 100 MG capsule Take 100 mg by mouth at bedtime.      aspirin 81 MG EC tablet Take 1 tablet (81 mg total) by mouth daily.  Swallow whole. 90 tablet 3   clotrimazole (LOTRIMIN) 1 % cream APPLY TWICE DAILY 30 g 2   cloZAPine (CLOZARIL) 100 MG tablet Take 2 tablets (200 mg total) by mouth 2 (two) times daily. 120 tablet 0   GNP MELATONIN 10 MG SUBL Take 1 tablet by mouth at bedtime.     LINZESS 290 MCG CAPS capsule TAKE ONE CAPSULE EACH DAY BEFORE BREAKFAST 30 capsule 5   Melatonin 10 MG TABS Take 10 mg by mouth at bedtime.      metoCLOPramide (REGLAN) 5 MG tablet TAKE ONE TABLET TWICE DAILY 60 tablet 11   MYRBETRIQ 50 MG TB24 tablet Take 50 mg by mouth daily.     omeprazole (PRILOSEC) 40 MG capsule TAKE ONE CAPSULE EVERY EVENING 90 capsule 3   PARoxetine (PAXIL) 40 MG tablet Take 40 mg by mouth every evening.      polyethylene glycol (MIRALAX / GLYCOLAX) packet Take 17 g by mouth daily. 14 each 0   polyethylene glycol powder (GLYCOLAX/MIRALAX) 17 GM/SCOOP powder 17G IN 8OZ OF FLUID OF CHOICE MONDAY ANDTHURSDAY 510 g 1   SENNA PLUS 8.6-50 MG tablet TAKE ONE TABLET TWICE DAILY 180 tablet 1   tamsulosin (FLOMAX) 0.4 MG CAPS capsule Take 0.4 mg by mouth daily.     zolpidem (AMBIEN) 10 MG tablet Take 10 mg by mouth at  bedtime.     No facility-administered medications prior to visit.   No Known Allergies    Objective:   Today's Vitals   03/20/22 1337  BP: 118/68  Pulse: (!) 102  Temp: 98.4 F (36.9 C)  TempSrc: Temporal  SpO2: 98%  Weight: 205 lb 12.8 oz (93.4 kg)  Height: '6\' 2"'$  (1.88 m)   Body mass index is 26.42 kg/m.   General: Well developed, well nourished. No acute distress. Extremities: Full ROM. No joint swelling or tenderness. The muscular development of the right calf appears   slightly smaller than the left. His strength is 5/5 for flexion and extension of the ankle. There is no redness or   bruising present. Psych: Alert and oriented. Normal mood and affect.  Health Maintenance Due  Topic Date Due   Zoster Vaccines- Shingrix (1 of 2) Never done   Imaging:  LE Venous Doppler US  (02/21/2022) Summary:  RIGHT:  - There is no evidence of deep vein thrombosis in the lower extremity.     - No cystic structure found in the popliteal fossa.     LEFT:  - No evidence of common femoral vein obstruction.     Assessment & Plan:   1. Strain of calf muscle, right, subsequent encounter I reviewed the UC note and ultrasound results. His history sounds suggestive of a calf muscle strain. I recommend we treat him with a 2 week trial of an NSAID, he use warm compresses over the calf each evening, and he avoid running up and down stairs. We will reassess this in 2-3 weeks if not improved.  - naproxen (NAPROSYN) 500 MG tablet; Take 1 tablet (500 mg total) by mouth 2 (two) times daily with a meal.  Dispense: 28 tablet; Refill: 0   Return in about 3 weeks (around 04/10/2022), or if symptoms worsen or fail to improve.   Haydee Salter, MD

## 2022-05-10 ENCOUNTER — Other Ambulatory Visit: Payer: Self-pay | Admitting: Family Medicine

## 2022-05-10 DIAGNOSIS — Z86010 Personal history of colonic polyps: Secondary | ICD-10-CM

## 2022-05-18 ENCOUNTER — Encounter: Payer: Self-pay | Admitting: Family Medicine

## 2022-05-18 ENCOUNTER — Ambulatory Visit (INDEPENDENT_AMBULATORY_CARE_PROVIDER_SITE_OTHER): Payer: Medicare Other | Admitting: Family Medicine

## 2022-05-18 VITALS — BP 118/70 | HR 96 | Temp 97.8°F | Ht 74.0 in | Wt 202.0 lb

## 2022-05-18 DIAGNOSIS — K5901 Slow transit constipation: Secondary | ICD-10-CM

## 2022-05-18 DIAGNOSIS — R399 Unspecified symptoms and signs involving the genitourinary system: Secondary | ICD-10-CM

## 2022-05-18 DIAGNOSIS — F79 Unspecified intellectual disabilities: Secondary | ICD-10-CM | POA: Diagnosis not present

## 2022-05-18 DIAGNOSIS — B353 Tinea pedis: Secondary | ICD-10-CM

## 2022-05-18 DIAGNOSIS — F209 Schizophrenia, unspecified: Secondary | ICD-10-CM | POA: Diagnosis not present

## 2022-05-18 MED ORDER — CLOTRIMAZOLE 1 % EX CREA: TOPICAL_CREAM | Freq: Two times a day (BID) | CUTANEOUS | 2 refills | Status: DC

## 2022-05-18 NOTE — Progress Notes (Signed)
[facility] [address] [place] [address] Dept: [phone] Dept Fax: [phone]  Chronic Care Office Visit  Subjective:    Patient ID: Jordan Kennedy, male    DOB: [birth year], 66 y.o..   MRN: [id number]  Chief Complaint  Patient presents with   Follow-up    F/u meds.  No concerns.       History of Present Illness:  Patient is in today for reassessment of chronic medical issues. Mr. Whang presents with his case worker, Lenell Antu. He currently lives in a group home. He has a sister that lives in [place], but it has been a while since she came to see him.   Mr. Meikle has a history of schizophrenia, anxiety with depression, and intellectual disability. He is currently followed by Dr. Darleene Cleaver (psychiatry). He is managed on clozapine, paroxetine, and amantadine.    Mr. Fiola has insomnia issues and is provide Ambien 10 mg and melatonin.   Mr. Pierpoint has a history of constipation issues related to slow transit and prior history of bowel obstruction. He does have linaclotide (Linzess) 290 mcg, Miralax, and senna-docusate available for management of this. He notes his bowels are moving regularly at this point without complaints.   Mr. Weideman has a history of bladder outlet issues. He is currently managed on Flomax 0.4 mg daily and Mybetriq 50 mg daily. He is urinating well at this point. He only gets up about twice a night. He denies any straining at his urine.   Mr. Herrington has a history of tinea pedis. he uses clotrimazole cream periodically to keep this at [place].   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 No pertinent surgical history.  Family History  Problem Relation Age of Onset   Cancer Mother        unknown type   Outpatient Medications Prior to Visit  Medication Sig Dispense Refill   amantadine (SYMMETREL) 100 MG capsule Take 100 mg by mouth at bedtime.      cloZAPine (CLOZARIL) 100 MG tablet Take 2 tablets (200 mg total) by mouth 2 (two) times daily. 120 tablet 0   GNP ASPIRIN 81 MG tablet TAKE ONE TABLET DAILY 90 tablet 3   GNP MELATONIN 10 MG SUBL Take 1 tablet by mouth at bedtime.     LINZESS 290 MCG CAPS capsule TAKE ONE CAPSULE EACH DAY BEFORE BREAKFAST 30 capsule 5   Melatonin 10 MG TABS Take 10 mg by mouth at bedtime.      metoCLOPramide (REGLAN) 5 MG tablet TAKE ONE TABLET TWICE DAILY 60 tablet 11   MYRBETRIQ 50 MG TB24 tablet Take 50 mg by mouth daily.     naproxen (NAPROSYN) 500 MG tablet Take 1 tablet (500 mg total) by mouth 2 (two) times daily with a meal. 28 tablet 0   omeprazole (PRILOSEC) 40 MG capsule TAKE ONE CAPSULE EVERY EVENING 90 capsule 3   PARoxetine (PAXIL) 40 MG tablet Take 40 mg by mouth every evening.      polyethylene glycol (MIRALAX / GLYCOLAX)  packet Take 17 g by mouth daily. 14 each 0   polyethylene glycol powder (GLYCOLAX/MIRALAX) 17 GM/SCOOP powder 17G IN 8OZ OF FLUID OF CHOICE MONDAY ANDTHURSDAY 510 g 1   SENNA PLUS 8.6-50 MG tablet TAKE ONE TABLET TWICE DAILY 180 tablet 1   tamsulosin (FLOMAX) 0.4 MG CAPS capsule Take 0.4 mg by mouth daily.     zolpidem (AMBIEN) 10 MG tablet Take 10 mg by mouth at bedtime.     clotrimazole (LOTRIMIN) 1 % cream APPLY TWICE DAILY 30 g 2   No facility-administered medications prior to visit.   No Known Allergies    Objective:   Today's Vitals   05/18/22 0934  BP: 118/70  Pulse: 96  Temp: 97.8 F (36.6 C)  TempSrc: Temporal  SpO2: 99%  Weight: 202 lb (91.6 kg)  Height: 6'  2" (1.88 m)   Body mass index is 25.94 kg/m.   General: Well developed, well nourished. No acute distress. Lungs: Clear to auscultation bilaterally. No wheezing, rales or rhonchi. CV: RRR without murmurs or rubs. Pulses 2+ bilaterally. Feet: Minor peeling between some toes without maceration. Psych: Alert and oriented. Normal mood and affect.  Health Maintenance Due  Topic Date Due   Zoster Vaccines- Shingrix (1 of 2) Never done   Imaging: CT of Abdomen and Pelvis w contrast (07/10/2018) IMPRESSION: 1. Findings consistent with partial small bowel obstruction with transition point possibly in the right to mid pelvis most likely due to adhesion. 2. Somewhat prominent prostate may cause a degree of bladder outlet obstruction. 3. Bibasilar linear atelectasis with small right pleural effusion.     Assessment & Plan:   1. Intellectual disability 2. Schizophrenia, unspecified type ([place]) Stable. Managed by psychiatry.  3. Constipation due to slow transit Well managed with linaclotide (Linzess) 290 mcg, Miralax, and senna-docusate.  4. Lower urinary tract symptoms (LUTS) Continue Flomax 0.4 mg daily.  5. Tinea pedis of both feet I will renew his clotrimazole for use as needed for recurrent tinea pedis. I emphasized the importance of him changing his socks daily and more often if needed when they get wet from sweating.  - clotrimazole (LOTRIMIN) 1 % cream; Apply topically 2 (two) times daily.  Dispense: 30 g; Refill: 2   Return in about 4 months (around 09/18/2022) for Reassessment.   Haydee Salter, MD

## 2022-07-20 ENCOUNTER — Other Ambulatory Visit: Payer: Self-pay | Admitting: Family Medicine

## 2022-07-20 DIAGNOSIS — K5901 Slow transit constipation: Secondary | ICD-10-CM

## 2022-08-01 ENCOUNTER — Other Ambulatory Visit: Payer: Self-pay | Admitting: Family Medicine

## 2022-08-01 DIAGNOSIS — Z86010 Personal history of colonic polyps: Secondary | ICD-10-CM

## 2022-08-02 ENCOUNTER — Telehealth: Payer: Self-pay | Admitting: Family Medicine

## 2022-08-02 NOTE — Telephone Encounter (Signed)
Type of forms received: centered profile  Routed [id number]  Paperwork received by : terrill   Individual made aware of 3-5 business day turn around (Y/N):yes  Form completed and patient made aware of charges(Y/N):yes   Faxed to :   Form location:  dr Gena Fray box

## 2022-08-02 NOTE — Telephone Encounter (Signed)
Form place on providers desk. Dm/cma

## 2022-08-03 NOTE — Telephone Encounter (Signed)
Form filled out and Alyson Locket, notified VIA phone that form is up front for pick up.  Dm/cma

## 2022-08-07 ENCOUNTER — Other Ambulatory Visit: Payer: Self-pay | Admitting: Family Medicine

## 2022-08-07 DIAGNOSIS — Z86010 Personal history of colonic polyps: Secondary | ICD-10-CM

## 2022-08-13 ENCOUNTER — Other Ambulatory Visit: Payer: Self-pay | Admitting: Family Medicine

## 2022-08-13 DIAGNOSIS — Z86010 Personal history of colonic polyps: Secondary | ICD-10-CM

## 2022-08-28 ENCOUNTER — Other Ambulatory Visit: Payer: Self-pay | Admitting: Family Medicine

## 2022-09-18 ENCOUNTER — Ambulatory Visit (INDEPENDENT_AMBULATORY_CARE_PROVIDER_SITE_OTHER): Payer: Medicare Other | Admitting: Family Medicine

## 2022-09-18 ENCOUNTER — Encounter: Payer: Self-pay | Admitting: Family Medicine

## 2022-09-18 VITALS — BP 116/70 | HR 100 | Temp 98.2°F | Ht 74.0 in | Wt 196.6 lb

## 2022-09-18 DIAGNOSIS — F79 Unspecified intellectual disabilities: Secondary | ICD-10-CM

## 2022-09-18 DIAGNOSIS — Z23 Encounter for immunization: Secondary | ICD-10-CM

## 2022-09-18 DIAGNOSIS — F419 Anxiety disorder, unspecified: Secondary | ICD-10-CM

## 2022-09-18 DIAGNOSIS — F32A Depression, unspecified: Secondary | ICD-10-CM

## 2022-09-18 DIAGNOSIS — F209 Schizophrenia, unspecified: Secondary | ICD-10-CM | POA: Diagnosis not present

## 2022-09-18 DIAGNOSIS — Z86010 Personal history of colonic polyps: Secondary | ICD-10-CM | POA: Diagnosis not present

## 2022-09-18 DIAGNOSIS — M2550 Pain in unspecified joint: Secondary | ICD-10-CM | POA: Diagnosis not present

## 2022-09-18 DIAGNOSIS — K5901 Slow transit constipation: Secondary | ICD-10-CM

## 2022-09-18 MED ORDER — NAPROXEN 500 MG PO TABS: 500.0000 mg | ORAL_TABLET | Freq: Two times a day (BID) | ORAL | 5 refills | Status: DC

## 2022-09-18 MED ORDER — ASPIRIN 81 MG PO TBEC: 81.0000 mg | DELAYED_RELEASE_TABLET | Freq: Every day | ORAL | 3 refills | Status: DC

## 2022-09-18 NOTE — Progress Notes (Signed)
[facility] [address] [place] [address] Dept: [phone] Dept Fax: [phone]  Chronic Care Office Visit  Subjective:    Patient ID: Jordan Kennedy, male    DOB: 12-06-1955, 66 y.o..   MRN: [id number]  No chief complaint on file.   History of Present Illness:  Patient is in today for reassessment of chronic medical issues. Mr. Frith presents with his case worker, Lenell Antu. He currently lives in a group home.    Mr. Ricketson has a history of schizophrenia, anxiety with depression, and intellectual disability. He is currently followed by Dr. Darleene Cleaver (psychiatry). He is managed on clozapine, paroxetine, and amantadine.    Mr. Fehnel has insomnia issues and is provide Ambien 10 mg and melatonin.   Mr. Teagle has a history of constipation issues related to slow transit and prior history of bowel obstruction. He does have linaclotide (Linzess) 290 mcg, Miralax, and senna-docusate available for management of this. He notes his bowels are moving regularly at this point without complaints.   Mr. Jachim has a history of bladder outlet issues. He is currently managed on Flomax 0.4 mg daily and Mybetriq 50 mg daily. He is urinating well at this point.    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 No pertinent surgical history.  Family History  Problem Relation Age of Onset   Cancer Mother        unknown type   Outpatient Medications Prior to Visit  Medication Sig Dispense Refill    amantadine (SYMMETREL) 100 MG capsule Take 100 mg by mouth at bedtime.      clotrimazole (LOTRIMIN) 1 % cream Apply topically 2 (two) times daily. 30 g 2   cloZAPine (CLOZARIL) 100 MG tablet Take 2 tablets (200 mg total) by mouth 2 (two) times daily. 120 tablet 0   GNP MELATONIN 10 MG SUBL Take 1 tablet by mouth at bedtime.     LINZESS 290 MCG CAPS capsule TAKE ONE CAPSULE EACH DAY BEFORE BREAKFAST 30 capsule 5   Melatonin 10 MG TABS Take 10 mg by mouth at bedtime.      metoCLOPramide (REGLAN) 5 MG tablet TAKE ONE TABLET TWICE DAILY 60 tablet 11   MYRBETRIQ 50 MG TB24 tablet Take 50 mg by mouth daily.     omeprazole (PRILOSEC) 40 MG capsule TAKE ONE CAPSULE EVERY EVENING 90 capsule 3   PARoxetine (PAXIL) 40 MG tablet Take 40 mg by mouth every evening.      polyethylene glycol (MIRALAX / GLYCOLAX) packet Take 17 g by mouth daily. 14 each 0   polyethylene glycol powder (GLYCOLAX/MIRALAX) 17 GM/SCOOP powder 17G IN 8OZ OF FLUID OF CHOICE MONDAY ANDTHURSDAY 510 g 1   SENNA PLUS 8.6-50 MG tablet TAKE ONE TABLET TWICE DAILY 180 tablet 1   tamsulosin (FLOMAX) 0.4 MG CAPS capsule Take 0.4 mg by mouth daily.     zolpidem (AMBIEN) 10 MG tablet Take 10 mg by mouth at bedtime.     GNP ASPIRIN 81 MG tablet TAKE ONE TABLET DAILY 90 tablet 0  naproxen (NAPROSYN) 500 MG tablet Take 1 tablet (500 mg total) by mouth 2 (two) times daily with a meal. 28 tablet 0   No facility-administered medications prior to visit.   No Known Allergies    Objective:   Today's Vitals   09/18/22 0859  BP: 116/70  Pulse: 100  Temp: 98.2 F (36.8 C)  TempSrc: Temporal  SpO2: 95%  Weight: 196 lb 9.6 oz (89.2 kg)  Height: '6\' 2"'$  (1.88 m)   Body mass index is 25.24 kg/m.   General: Well developed, well nourished. No acute distress. Psych: Alert and oriented. Normal mood and affect.  Health Maintenance Due  Topic Date Due   Zoster Vaccines- Shingrix (1 of 2) Never done   INFLUENZA VACCINE  05/22/2022   COVID-19  Vaccine (4 - 2023-24 season) 06/22/2022   Medicare Annual Wellness (AWV)  10/11/2022     Assessment & Plan:   1. Schizophrenia, unspecified type ([place]) 2. Intellectual disability Stable. Managed by psychiatry.   3. History of colon polyps Continue daily aspirin related to history of colon polyps.  - aspirin EC (GNP ASPIRIN) 81 MG tablet; Take 1 tablet (81 mg total) by mouth daily.  Dispense: 90 tablet; Refill: 3  4. Arthralgia, unspecified joint Stable with PRN use of naproxen for joint pains.  - naproxen (NAPROSYN) 500 MG tablet; Take 1 tablet (500 mg total) by mouth 2 (two) times daily with a meal.  Dispense: 28 tablet; Refill: 5  5. Constipation due to slow transit Well managed with linaclotide (Linzess) 290 mcg PRN, Miralax, and senna-docusate.   6. Anxiety and depression Stable. Managed by psychiatry.   7. Need for influenza vaccination  - Flu Vaccine QUAD 6+ mos PF IM (Fluarix Quad PF)  Return in about 6 months (around 03/19/2023) for Reassessment.   Haydee Salter, MD

## 2022-10-03 ENCOUNTER — Encounter: Payer: Self-pay | Admitting: Family Medicine

## 2022-10-16 ENCOUNTER — Ambulatory Visit (INDEPENDENT_AMBULATORY_CARE_PROVIDER_SITE_OTHER): Payer: Medicare Other

## 2022-10-16 VITALS — Ht 74.0 in | Wt 198.0 lb

## 2022-10-16 DIAGNOSIS — Z0000 Encounter for general adult medical examination without abnormal findings: Secondary | ICD-10-CM | POA: Diagnosis not present

## 2022-10-16 NOTE — Progress Notes (Signed)
I connected with Jordan Kennedy today by telephone and verified that I am speaking with the correct person using two identifiers. Location patient: home Location provider: work Persons participating in the virtual visit: Jordan Kennedy, [name] (friend/ caregiver), Glenna Durand LPN.   I discussed the limitations, risks, security and privacy concerns of performing an evaluation and management service by telephone and the availability of in person appointments. I also discussed with the patient that there may be a patient responsible charge related to this service. The patient expressed understanding and verbally consented to this telephonic visit.    Interactive audio and video telecommunications were attempted between this provider and patient, however failed, due to patient having technical difficulties OR patient did not have access to video capability.  We continued and completed visit with audio only.     Vital signs may be patient reported or missing.  Subjective:   Jordan Kennedy is a 66 y.o. male who presents for Medicare Annual/Subsequent preventive examination.  Review of Systems     Cardiac Risk Factors include: advanced age (>41mn, >>38women);male gender;smoking/ tobacco exposure     Objective:    Today's Vitals   10/16/22 1611  Weight: 198 lb (89.8 kg)  Height: '6\' 2"'$  (1.88 m)   Body mass index is 25.42 kg/m.     10/16/2022    4:18 PM 07/10/2018    4:39 PM 07/10/2018    9:33 AM 07/07/2018    6:00 AM 07/06/2018    5:00 AM 07/05/2018    6:00 AM 06/29/2018    4:14 PM  Advanced Directives  Does Patient Have a Medical Advance Directive? Yes Yes Yes Yes Yes Yes No  [place]     Does patient want to make changes to medical advance directive?  No - Patient declined       Copy of [place] Chart? No - copy requested No - copy requested          Current Medications (verified) Outpatient Encounter Medications as of 10/16/2022  Medication Sig   amantadine (SYMMETREL) 100 MG capsule Take 100 mg by mouth at bedtime.    aspirin EC (GNP ASPIRIN) 81 MG tablet Take 1 tablet (81 mg total) by mouth daily.   clotrimazole (LOTRIMIN) 1 % cream Apply topically 2 (two) times daily.   cloZAPine (CLOZARIL) 100 MG tablet Take 2 tablets (200 mg total) by mouth 2 (two) times daily.   GNP MELATONIN 10 MG SUBL Take 1 tablet by mouth at bedtime.   LINZESS 290 MCG CAPS capsule TAKE ONE CAPSULE EACH DAY BEFORE BREAKFAST   Melatonin 10 MG TABS Take 10 mg by mouth at bedtime.    metoCLOPramide (REGLAN) 5 MG tablet TAKE ONE TABLET TWICE DAILY   MYRBETRIQ 50 MG TB24 tablet Take 50 mg by mouth daily.   omeprazole (PRILOSEC) 40 MG capsule TAKE ONE CAPSULE EVERY EVENING   PARoxetine (PAXIL) 40 MG tablet Take 40 mg by mouth every evening.    polyethylene glycol powder (GLYCOLAX/MIRALAX) 17 GM/SCOOP powder 17G IN 8OZ OF FLUID OF CHOICE MONDAY ANDTHURSDAY   SENNA PLUS 8.6-50 MG tablet TAKE ONE TABLET TWICE DAILY   tamsulosin (FLOMAX) 0.4 MG CAPS capsule Take 0.4 mg by mouth daily.   zolpidem (AMBIEN) 10 MG tablet Take 10 mg by mouth at bedtime.   naproxen (NAPROSYN) 500 MG tablet Take 1 tablet (500 mg total) by mouth 2 (two)  times daily with a meal. (Patient not taking: Reported on 10/16/2022)   polyethylene glycol (MIRALAX / GLYCOLAX) packet Take 17 g by mouth daily. (Patient not taking: Reported on 10/16/2022)   No facility-administered encounter medications on file as of 10/16/2022.    Allergies (verified) Patient has no known allergies.   History: Past Medical History:  Diagnosis Date   AKI (acute kidney injury) ([place]) 06/29/2018   Anemia    Anemia, iron deficiency 09/18/2015   Bowel obstruction ([facility])    BPH (benign prostatic hyperplasia)    Chronic constipation    History of ileus    Ileus ([place]) 03/07/2017   Mental retardation    Partial bowel  obstruction ([place])    Schizophrenia ([place])    Monarche every three months;    SIRS (systemic inflammatory response syndrome) ([place]) 06/29/2018   Small bowel obstruction ([place]) 06/29/2018   History reviewed. No pertinent surgical history. Family History  Problem Relation Age of Onset   Cancer Mother        unknown type   Social History   Socioeconomic History   Marital status: Single    Spouse name: Not on file   Number of children: Not on file   Years of education: Not on file   Highest education level: Not on file  Occupational History   Not on file  Tobacco Use   Smoking status: Every Day    Packs/day: 0.25    Years: 30.00    Total pack years: 7.50    Types: Cigarettes   Smokeless tobacco: Never  Vaping Use   Vaping Use: Never used  Substance and Sexual Activity   Alcohol use: No   Drug use: No   Sexual activity: Never  Other Topics Concern   Not on file  Social History Narrative   Marital status: single      Children: none       Employment: disability due to mental retardation and schizophrenia.      Lives: group home [facility]; [facility]; 3 men in group home.        Tobacco: six cigarettes per day; since age 74.      Alcohol:  None      Drugs: none      Exercise:  Walking some.      Seatbelt:  100%   Social Determinants of Health   Financial Resource Strain: Low Risk  (10/16/2022)   Overall Financial Resource Strain (CARDIA)    Difficulty of Paying Living Expenses: Not hard at all  Food Insecurity: No Food Insecurity (10/16/2022)   Hunger Vital Sign    Worried About Running Out of Food in the Last Year: Never true    Ran Out of Food in the Last Year: Never true  Transportation Needs: No Transportation Needs (10/16/2022)   PRAPARE - Hydrologist (Medical): No    Lack of Transportation (Non-Medical): No  Physical Activity: Inactive (10/16/2022)   Exercise Vital Sign    Days of Exercise per Week: 0 days     Minutes of Exercise per Session: 0 min  Stress: No Stress Concern Present (10/16/2022)   [place]    Feeling of Stress : Not at all  Social Connections: Socially Isolated (10/11/2021)   Social Connection and Isolation Panel [NHANES]    Frequency of Communication with Friends and Family: Twice a week    Frequency of Social Gatherings with Friends and  Family: Twice a week    Attends Religious Services: Never    Marine scientist or Organizations: No    Attends Music therapist: Never    Marital Status: Never married    Tobacco Counseling Ready to quit: No Counseling given: Not Answered   Clinical Intake:  Pre-visit preparation completed: Yes  Pain : No/denies pain     Nutritional Status: BMI 25 -29 Overweight Nutritional Risks: None Diabetes: No  How often do you need to have someone help you when you read instructions, pamphlets, or other written materials from your doctor or pharmacy?: 4 - Often  Diabetic? no  Interpreter Needed?: No  Information entered by :: NAllen LPN   Activities of Daily Living    10/16/2022    4:18 PM  In your present state of health, do you have any difficulty performing the following activities:  Hearing? 0  Vision? 0  Difficulty concentrating or making decisions? 0  Walking or climbing stairs? 0  Dressing or bathing? 0  Doing errands, shopping? 1  Preparing Food and eating ? Y  Using the Toilet? N  In the past six months, have you accidently leaked urine? Y  Do you have problems with loss of bowel control? N  Managing your Medications? Y  Managing your Finances? Y  Housekeeping or managing your Housekeeping? Y    Patient Care Team: Haydee Salter, MD as PCP - General (Family Medicine) Ladene Artist, MD as Consulting Physician (Gastroenterology) Corena Pilgrim, MD as Consulting Physician (Psychiatry) Ardis Hughs, MD as Attending  Physician (Urology)  Indicate any recent Medical Services you may have received from other than [facility] providers in the past year (date may be approximate).     Assessment:   This is a routine wellness examination for [place].  Hearing/Vision screen Vision Screening - Comments:: No regular eye exams,  Dietary issues and exercise activities discussed: Current Exercise Habits: The patient does not participate in regular exercise at present   Goals Addressed             This Visit's Progress    Patient Stated       10/16/2022, eat healthier       Depression Screen    10/16/2022    4:18 PM 10/11/2021    3:51 PM 10/11/2021    3:49 PM 01/31/2021    9:04 AM 08/29/2015    4:02 PM 05/25/2015   10:19 AM  PHQ 2/9 Scores  PHQ - 2 Score 0 0 0 0 0 0    Fall Risk    10/16/2022    4:18 PM 10/11/2021    3:51 PM 08/29/2015    4:02 PM 05/25/2015   10:19 AM  [place] in the past year? 0 0 No No  Number falls in past yr: 0 0    Injury with Fall? 0 0    Risk for fall due to : No Fall Risks;Medication side effect     Follow up Falls prevention discussed;Education provided;Falls evaluation completed Falls evaluation completed      FALL RISK PREVENTION PERTAINING TO THE HOME:  Any stairs in or around the home? Yes  If so, are there any without handrails? No  Home free of loose throw rugs in walkways, pet beds, electrical cords, etc? Yes  Adequate lighting in your home to reduce risk of falls? Yes   ASSISTIVE DEVICES UTILIZED TO PREVENT FALLS:  Life alert? No  Use of a cane,  walker or w/c? No  Grab bars in the bathroom? Yes  Shower chair or bench in shower? No  Elevated toilet seat or a handicapped toilet? No   TIMED UP AND GO:      Cognitive Function:    6 CIT not administered. Patient has a diagnosis of intellectual disability noted.    Immunizations Immunization History  Administered Date(s) Administered   Influenza,inj,Quad PF,6+ Mos 08/28/2014,  06/20/2015, 08/11/2021, 09/18/2022   PFIZER Comirnaty(Gray Top)Covid-19 Tri-Sucrose Vaccine 11/04/2019, 11/25/2019, 09/26/2020   Pneumococcal Polysaccharide-23 03/30/2009, 04/26/2012   Tdap 03/30/2009, 05/02/2021    TDAP status: Up to date  Flu Vaccine status: Up to date  Pneumococcal vaccine status: Up to date  Covid-19 vaccine status: Completed vaccines  Qualifies for Shingles Vaccine? Yes   Zostavax completed No   Shingrix Completed?: No.    Education has been provided regarding the importance of this vaccine. Patient has been advised to call insurance company to determine out of pocket expense if they have not yet received this vaccine. Advised may also receive vaccine at local pharmacy or Health Dept. Verbalized acceptance and understanding.  Screening Tests Health Maintenance  Topic Date Due   Zoster Vaccines- Shingrix (1 of 2) Never done   COVID-19 Vaccine (4 - 2023-24 season) 06/22/2022   Medicare Annual Wellness (AWV)  10/11/2022   COLONOSCOPY (Pts 45-33yr Insurance coverage will need to be confirmed)  12/01/2025   DTaP/Tdap/Td (3 - Td or Tdap) 05/03/2031   INFLUENZA VACCINE  Completed   Hepatitis C Screening  Completed   HIV Screening  Completed   HPV VACCINES  Aged Out    Health Maintenance  Health Maintenance Due  Topic Date Due   Zoster Vaccines- Shingrix (1 of 2) Never done   COVID-19 Vaccine (4 - 2023-24 season) 06/22/2022   Medicare Annual Wellness (AWV)  10/11/2022    Colorectal cancer screening: Type of screening: Colonoscopy. Completed 12/02/2015. Repeat every 10 years  Lung Cancer Screening: (Low Dose CT Chest recommended if Age 280-80years, 30 pack-year currently smoking OR have quit w/in 15years.) does not qualify.   Lung Cancer Screening Referral: no  Additional Screening:  Hepatitis C Screening: does qualify; Completed 01/31/2021  Vision Screening: Recommended annual ophthalmology exams for early detection of glaucoma and other disorders of  the eye. Is the patient up to date with their annual eye exam?  No  Who is the provider or what is the name of the office in which the patient attends annual eye exams? none If pt is not established with a provider, would they like to be referred to a provider to establish care? No .   Dental Screening: Recommended annual dental exams for proper oral hygiene  Community Resource Referral / Chronic Care Management: CRR required this visit?  No   CCM required this visit?  No      Plan:     I have personally reviewed and noted the following in the patient's chart:   Medical and social history Use of alcohol, tobacco or illicit drugs  Current medications and supplements including opioid prescriptions. Patient is not currently taking opioid prescriptions. Functional ability and status Nutritional status Physical activity Advanced directives List of other physicians Hospitalizations, surgeries, and ER visits in previous 12 months Vitals Screenings to include cognitive, depression, and falls Referrals and appointments  In addition, I have reviewed and discussed with patient certain preventive protocols, quality metrics, and best practice recommendations. A written personalized care plan for preventive services as well as general  preventive health recommendations were provided to patient.     Kellie Simmering, LPN   [id number]   Nurse Notes: none  Due to this being a virtual visit, the after visit summary with patients personalized plan was offered to patient via mail or my-chart.  to pick up at office at next visit

## 2022-10-16 NOTE — Patient Instructions (Signed)
Mr. Jordan Kennedy , Thank you for taking time to come for your Medicare Wellness Visit. I appreciate your ongoing commitment to your health goals. Please review the following plan we discussed and let me know if I can assist you in the future.   These are the goals we discussed:  Goals      Patient Stated     10/16/2022, eat healthier        This is a list of the screening recommended for you and due dates:  Health Maintenance  Topic Date Due   Zoster (Shingles) Vaccine (1 of 2) Never done   COVID-19 Vaccine (4 - 2023-24 season) 06/22/2022   Medicare Annual Wellness Visit  10/17/2023   Colon Cancer Screening  12/01/2025   DTaP/Tdap/Td vaccine (3 - Td or Tdap) 05/03/2031   Flu Shot  Completed   Hepatitis C Screening: USPSTF Recommendation to screen - Ages 18-79 yo.  Completed   HIV Screening  Completed   HPV Vaccine  Aged Out    Advanced directives: Please bring a copy of your POA (Power of [place]) and/or Living Will to your next appointment.   Conditions/risks identified: smoking  Next appointment: Follow up in one year for your annual wellness visit   Preventive Care 40-64 Years, Male Preventive care refers to lifestyle choices and visits with your health care provider that can promote health and wellness. What does preventive care include? A yearly physical exam. This is also called an annual well check. Dental exams once or twice a year. Routine eye exams. Ask your health care provider how often you should have your eyes checked. Personal lifestyle choices, including: Daily care of your teeth and gums. Regular physical activity. Eating a healthy diet. Avoiding tobacco and drug use. Limiting alcohol use. Practicing safe sex. Taking low-dose aspirin every day starting at age 57. What happens during an annual well check? The services and screenings done by your health care provider during your annual well check will depend on your age, overall health, lifestyle risk factors,  and family history of disease. Counseling  Your health care provider may ask you questions about your: Alcohol use. Tobacco use. Drug use. Emotional well-being. Home and relationship well-being. Sexual activity. Eating habits. Work and work Statistician. Screening  You may have the following tests or measurements: Height, weight, and BMI. Blood pressure. Lipid and cholesterol levels. These may be checked every 5 years, or more frequently if you are over 58 years old. Skin check. Lung cancer screening. You may have this screening every year starting at age 89 if you have a 30-pack-year history of smoking and currently smoke or have quit within the past 15 years. Fecal occult blood test (FOBT) of the stool. You may have this test every year starting at age 86. Flexible sigmoidoscopy or colonoscopy. You may have a sigmoidoscopy every 5 years or a colonoscopy every 10 years starting at age 56. Prostate cancer screening. Recommendations will vary depending on your family history and other risks. Hepatitis C blood test. Hepatitis B blood test. Sexually transmitted disease (STD) testing. Diabetes screening. This is done by checking your blood sugar (glucose) after you have not eaten for a while (fasting). You may have this done every 1-3 years. Discuss your test results, treatment options, and if necessary, the need for more tests with your health care provider. Vaccines  Your health care provider may recommend certain vaccines, such as: Influenza vaccine. This is recommended every year. Tetanus, diphtheria, and acellular pertussis (Tdap, Td) vaccine.  You may need a Td booster every 10 years. Zoster vaccine. You may need this after age 48. Pneumococcal 13-valent conjugate (PCV13) vaccine. You may need this if you have certain conditions and have not been vaccinated. Pneumococcal polysaccharide (PPSV23) vaccine. You may need one or two doses if you smoke cigarettes or if you have certain  conditions. Talk to your health care provider about which screenings and vaccines you need and how often you need them. This information is not intended to replace advice given to you by your health care provider. Make sure you discuss any questions you have with your health care provider. Document Released: 11/04/2015 Document Revised: 06/27/2016 Document Reviewed: 08/09/2015 Elsevier Interactive Patient Education  2017 [place] Prevention in the Home Falls can cause injuries. They can happen to people of all ages. There are many things you can do to make your home safe and to help prevent falls. What can I do on the outside of my home? Regularly fix the edges of walkways and driveways and fix any cracks. Remove anything that might make you trip as you walk through a door, such as a raised step or threshold. Trim any bushes or trees on the path to your home. Use bright outdoor lighting. Clear any walking paths of anything that might make someone trip, such as rocks or tools. Regularly check to see if handrails are loose or broken. Make sure that both sides of any steps have handrails. Any raised decks and porches should have guardrails on the edges. Have any leaves, snow, or ice cleared regularly. Use sand or salt on walking paths during winter. Clean up any spills in your garage right away. This includes oil or grease spills. What can I do in the bathroom? Use night lights. Install grab bars by the toilet and in the tub and shower. Do not use towel bars as grab bars. Use non-skid mats or decals in the tub or shower. If you need to sit down in the shower, use a plastic, non-slip stool. Keep the floor dry. Clean up any water that spills on the floor as soon as it happens. Remove soap buildup in the tub or shower regularly. Attach bath mats securely with double-sided non-slip rug tape. Do not have throw rugs and other things on the floor that can make you trip. What can I do in  the bedroom? Use night lights. Make sure that you have a light by your bed that is easy to reach. Do not use any sheets or blankets that are too big for your bed. They should not hang down onto the floor. Have a firm chair that has side arms. You can use this for support while you get dressed. Do not have throw rugs and other things on the floor that can make you trip. What can I do in the kitchen? Clean up any spills right away. Avoid walking on wet floors. Keep items that you use a lot in easy-to-reach places. If you need to reach something above you, use a strong step stool that has a grab bar. Keep electrical cords out of the way. Do not use floor polish or wax that makes floors slippery. If you must use wax, use non-skid floor wax. Do not have throw rugs and other things on the floor that can make you trip. What can I do with my stairs? Do not leave any items on the stairs. Make sure that there are handrails on both sides of the stairs and  use them. Fix handrails that are broken or loose. Make sure that handrails are as long as the stairways. Check any carpeting to make sure that it is firmly attached to the stairs. Fix any carpet that is loose or worn. Avoid having throw rugs at the top or bottom of the stairs. If you do have throw rugs, attach them to the floor with carpet tape. Make sure that you have a light switch at the top of the stairs and the bottom of the stairs. If you do not have them, ask someone to add them for you. What else can I do to help prevent falls? Wear shoes that: Do not have high heels. Have rubber bottoms. Are comfortable and fit you well. Are closed at the toe. Do not wear sandals. If you use a stepladder: Make sure that it is fully opened. Do not climb a closed stepladder. Make sure that both sides of the stepladder are locked into place. Ask someone to hold it for you, if possible. Clearly mark and make sure that you can see: Any grab bars or  handrails. First and last steps. Where the edge of each step is. Use tools that help you move around (mobility aids) if they are needed. These include: Canes. Walkers. Scooters. Crutches. Turn on the lights when you go into a dark area. Replace any light bulbs as soon as they burn out. Set up your furniture so you have a clear path. Avoid moving your furniture around. If any of your floors are uneven, fix them. If there are any pets around you, be aware of where they are. Review your medicines with your doctor. Some medicines can make you feel dizzy. This can increase your chance of falling. Ask your doctor what other things that you can do to help prevent falls. This information is not intended to replace advice given to you by your health care provider. Make sure you discuss any questions you have with your health care provider. Document Released: 08/04/2009 Document Revised: 03/15/2016 Document Reviewed: 11/12/2014 Elsevier Interactive Patient Education  2017 [employer].

## 2022-10-31 ENCOUNTER — Other Ambulatory Visit: Payer: Self-pay | Admitting: Family Medicine

## 2022-11-15 ENCOUNTER — Other Ambulatory Visit: Payer: Self-pay | Admitting: Family Medicine

## 2022-11-15 DIAGNOSIS — K219 Gastro-esophageal reflux disease without esophagitis: Secondary | ICD-10-CM

## 2022-12-25 ENCOUNTER — Encounter: Payer: Self-pay | Admitting: Family Medicine

## 2022-12-25 ENCOUNTER — Ambulatory Visit (INDEPENDENT_AMBULATORY_CARE_PROVIDER_SITE_OTHER): Payer: Medicare Other | Admitting: Family Medicine

## 2022-12-25 VITALS — BP 122/66 | HR 40 | Temp 98.3°F | Ht 74.0 in | Wt 204.0 lb

## 2022-12-25 DIAGNOSIS — J069 Acute upper respiratory infection, unspecified: Secondary | ICD-10-CM

## 2022-12-25 LAB — POC COVID19 BINAXNOW: SARS Coronavirus 2 Ag: NEGATIVE

## 2022-12-25 NOTE — Progress Notes (Signed)
[facility] [address] [place] [address] Dept: [phone] Dept Fax: [phone]  Office Visit  Subjective:    Patient ID: Jordan Kennedy, male    DOB: 05/31/1956, 67 y.o..   MRN: [id number]  Chief Complaint  Patient presents with   Cough    Started on Friday with cough, congestion, felt weak   History of Present Illness:  Patient is in today with a 4-day history of complaints of feeling mildly ill. He has had a minor cough over the weekend, primarily when he smokes. He has not run fever.He denies nay nasal congestion. He is eatign and drinkign wella nd has continued to go to work without troubles.   Past Medical History: Patient Active Problem List   Diagnosis Date Noted   Tardive dyskinesia 11/20/2021   Lower urinary tract symptoms (LUTS) 05/02/2021   History of small bowel obstruction 01/31/2021   History of colon polyps 01/31/2021   Onychomycosis 01/31/2021   Gastroesophageal reflux disease 01/31/2021   Constipation due to slow transit 04/25/2012   Anxiety and depression 04/25/2012   Edentulous 04/25/2012   Schizophrenia ([place])    Intellectual disability    No past surgical history on file. Family History  Problem Relation Age of Onset   Cancer Mother        unknown type   Outpatient Medications Prior to Visit  Medication Sig Dispense Refill   amantadine (SYMMETREL) 100 MG capsule Take 100 mg by mouth at bedtime.      aspirin EC (GNP ASPIRIN) 81 MG tablet Take 1 tablet (81 mg total) by mouth daily. 90 tablet 3   clotrimazole (LOTRIMIN) 1 % cream Apply topically 2 (two) times daily. 30 g 2   cloZAPine (CLOZARIL) 100 MG tablet Take 2 tablets (200 mg total) by mouth 2 (two) times daily. 120 tablet 0   LINZESS 290 MCG CAPS capsule TAKE ONE CAPSULE EACH DAY BEFORE BREAKFAST 30 capsule 5   Melatonin 10 MG TABS Take 10 mg by mouth at bedtime.      metoCLOPramide (REGLAN) 5 MG tablet TAKE ONE TABLET TWICE DAILY  60 tablet 11   MYRBETRIQ 50 MG TB24 tablet Take 50 mg by mouth daily.     omeprazole (PRILOSEC) 40 MG capsule TAKE ONE CAPSULE EVERY EVENING 90 capsule 1   PARoxetine (PAXIL) 40 MG tablet Take 40 mg by mouth every evening.      polyethylene glycol powder (GLYCOLAX/MIRALAX) 17 GM/SCOOP powder 17G IN 8OZ OF FLUID OF CHOICE MONDAY AND THURSDAY 510 g 1   SENNA PLUS 8.6-50 MG tablet TAKE ONE TABLET TWICE DAILY 180 tablet 1   tamsulosin (FLOMAX) 0.4 MG CAPS capsule Take 0.4 mg by mouth daily.     zolpidem (AMBIEN) 10 MG tablet Take 10 mg by mouth at bedtime.     GNP MELATONIN 10 MG SUBL Take 1 tablet by mouth at bedtime. (Patient not taking: Reported on 12/25/2022)     naproxen (NAPROSYN) 500 MG tablet Take 1 tablet (500 mg total) by mouth 2 (two) times daily with a meal. (Patient not taking: Reported on 10/16/2022) 28 tablet 5   polyethylene glycol (MIRALAX / GLYCOLAX) packet Take 17 g by mouth daily. (Patient not taking: Reported on 12/25/2022) 14 each 0   No facility-administered medications prior to visit.   No Known Allergies   Objective:   Today's Vitals   12/25/22 1554  BP: 122/66  Pulse: (!) 40  Temp: 98.3 F (36.8 C)  SpO2: 91%  Weight: 204 lb (92.5 kg)  Height: '6\' 2"'$  (1.88 m)   Body mass index is 26.19 kg/m.   General: Well developed, well nourished. No acute distress. HEENT: Normocephalic, non-traumatic. PERRL, EOMI. Conjunctiva clear. External ears normal. EAC and TMs normal   bilaterally. Nose clear without congestion or rhinorrhea. Mucous membranes moist. Oropharynx clear. Good dentition. Neck: Supple. No lymphadenopathy. No thyromegaly. Lungs: Clear to auscultation bilaterally. No wheezing, rales or rhonchi. Psych: Alert and oriented. Normal mood and affect.  Health Maintenance Due  Topic Date Due   Zoster Vaccines- Shingrix (1 of 2) Never done   COVID-19 Vaccine (4 - 2023-24 season) 06/22/2022   Lab Results POCT Covid: Neg.    Assessment & Plan:   Problem List  Items Addressed This Visit       Respiratory   RESOLVED: Viral URI with cough - Primary   Relevant Orders   POC COVID-19 (Completed)    Return for Follow-up as scheduled.   Haydee Salter, MD

## 2023-01-01 ENCOUNTER — Emergency Department (HOSPITAL_COMMUNITY): Payer: Medicare Other

## 2023-01-01 ENCOUNTER — Encounter (HOSPITAL_COMMUNITY): Payer: Self-pay

## 2023-01-01 ENCOUNTER — Encounter (HOSPITAL_COMMUNITY): Payer: Self-pay | Admitting: Internal Medicine

## 2023-01-01 ENCOUNTER — Ambulatory Visit (HOSPITAL_COMMUNITY)
Admission: EM | Admit: 2023-01-01 | Discharge: 2023-01-01 | Discharge status: Against Medical Advice | Payer: Medicare Other | Attending: Internal Medicine | Admitting: Internal Medicine

## 2023-01-01 ENCOUNTER — Inpatient Hospital Stay (HOSPITAL_COMMUNITY)
Admission: EM | Admit: 2023-01-01 | Discharge: 2023-01-07 | DRG: 871 | Disposition: A | Discharge status: Home Health | Payer: Medicare Other | Source: Ambulatory Visit | Attending: Family Medicine | Admitting: Family Medicine

## 2023-01-01 ENCOUNTER — Other Ambulatory Visit: Payer: Self-pay

## 2023-01-01 DIAGNOSIS — R509 Fever, unspecified: Secondary | ICD-10-CM | POA: Diagnosis not present

## 2023-01-01 DIAGNOSIS — Z720 Tobacco use: Secondary | ICD-10-CM | POA: Diagnosis present

## 2023-01-01 DIAGNOSIS — J189 Pneumonia, unspecified organism: Secondary | ICD-10-CM | POA: Diagnosis present

## 2023-01-01 DIAGNOSIS — A419 Sepsis, unspecified organism: Principal | ICD-10-CM | POA: Diagnosis present

## 2023-01-01 DIAGNOSIS — R0902 Hypoxemia: Secondary | ICD-10-CM

## 2023-01-01 DIAGNOSIS — Z1152 Encounter for screening for COVID-19: Secondary | ICD-10-CM | POA: Diagnosis not present

## 2023-01-01 DIAGNOSIS — F209 Schizophrenia, unspecified: Secondary | ICD-10-CM | POA: Diagnosis not present

## 2023-01-01 DIAGNOSIS — K5909 Other constipation: Secondary | ICD-10-CM | POA: Diagnosis present

## 2023-01-01 DIAGNOSIS — F203 Undifferentiated schizophrenia: Secondary | ICD-10-CM | POA: Diagnosis present

## 2023-01-01 DIAGNOSIS — R6520 Severe sepsis without septic shock: Secondary | ICD-10-CM | POA: Diagnosis present

## 2023-01-01 DIAGNOSIS — I340 Nonrheumatic mitral (valve) insufficiency: Secondary | ICD-10-CM | POA: Diagnosis not present

## 2023-01-01 DIAGNOSIS — N400 Enlarged prostate without lower urinary tract symptoms: Secondary | ICD-10-CM | POA: Diagnosis present

## 2023-01-01 DIAGNOSIS — F79 Unspecified intellectual disabilities: Secondary | ICD-10-CM

## 2023-01-01 DIAGNOSIS — J9601 Acute respiratory failure with hypoxia: Secondary | ICD-10-CM | POA: Diagnosis present

## 2023-01-01 DIAGNOSIS — R9431 Abnormal electrocardiogram [ECG] [EKG]: Secondary | ICD-10-CM | POA: Diagnosis not present

## 2023-01-01 DIAGNOSIS — F17210 Nicotine dependence, cigarettes, uncomplicated: Secondary | ICD-10-CM | POA: Diagnosis present

## 2023-01-01 DIAGNOSIS — Z79899 Other long term (current) drug therapy: Secondary | ICD-10-CM

## 2023-01-01 DIAGNOSIS — I5021 Acute systolic (congestive) heart failure: Secondary | ICD-10-CM | POA: Diagnosis present

## 2023-01-01 DIAGNOSIS — I509 Heart failure, unspecified: Secondary | ICD-10-CM

## 2023-01-01 DIAGNOSIS — G2401 Drug induced subacute dyskinesia: Secondary | ICD-10-CM | POA: Diagnosis present

## 2023-01-01 DIAGNOSIS — D75839 Thrombocytosis, unspecified: Secondary | ICD-10-CM | POA: Diagnosis present

## 2023-01-01 DIAGNOSIS — D509 Iron deficiency anemia, unspecified: Secondary | ICD-10-CM | POA: Diagnosis present

## 2023-01-01 DIAGNOSIS — I2489 Other forms of acute ischemic heart disease: Secondary | ICD-10-CM | POA: Diagnosis present

## 2023-01-01 DIAGNOSIS — I5023 Acute on chronic systolic (congestive) heart failure: Secondary | ICD-10-CM | POA: Diagnosis not present

## 2023-01-01 DIAGNOSIS — J40 Bronchitis, not specified as acute or chronic: Secondary | ICD-10-CM | POA: Diagnosis not present

## 2023-01-01 DIAGNOSIS — I5022 Chronic systolic (congestive) heart failure: Secondary | ICD-10-CM | POA: Insufficient documentation

## 2023-01-01 DIAGNOSIS — D649 Anemia, unspecified: Secondary | ICD-10-CM | POA: Diagnosis present

## 2023-01-01 DIAGNOSIS — R0602 Shortness of breath: Secondary | ICD-10-CM

## 2023-01-01 DIAGNOSIS — I459 Conduction disorder, unspecified: Secondary | ICD-10-CM | POA: Diagnosis present

## 2023-01-01 DIAGNOSIS — J410 Simple chronic bronchitis: Secondary | ICD-10-CM | POA: Diagnosis present

## 2023-01-01 DIAGNOSIS — Z7982 Long term (current) use of aspirin: Secondary | ICD-10-CM

## 2023-01-01 DIAGNOSIS — R7401 Elevation of levels of liver transaminase levels: Secondary | ICD-10-CM | POA: Diagnosis present

## 2023-01-01 LAB — CBG MONITORING, ED: Glucose-Capillary: 134 mg/dL — ABNORMAL HIGH (ref 70–99)

## 2023-01-01 LAB — I-STAT ARTERIAL BLOOD GAS, ED
Acid-base deficit: 1 mmol/L (ref 0.0–2.0)
Bicarbonate: 23.7 mmol/L (ref 20.0–28.0)
Calcium, Ion: 1.26 mmol/L (ref 1.15–1.40)
HCT: 33 % — ABNORMAL LOW (ref 39.0–52.0)
Hemoglobin: 11.2 g/dL — ABNORMAL LOW (ref 13.0–17.0)
O2 Saturation: 90 %
Patient temperature: 98.6
Potassium: 3.8 mmol/L (ref 3.5–5.1)
Sodium: 143 mmol/L (ref 135–145)
TCO2: 25 mmol/L (ref 22–32)
pCO2 arterial: 36.9 mmHg (ref 32–48)
pH, Arterial: 7.414 (ref 7.35–7.45)
pO2, Arterial: 58 mmHg — ABNORMAL LOW (ref 83–108)

## 2023-01-01 LAB — COMPREHENSIVE METABOLIC PANEL
ALT: 137 U/L — ABNORMAL HIGH (ref 0–44)
AST: 59 U/L — ABNORMAL HIGH (ref 15–41)
Albumin: 2.7 g/dL — ABNORMAL LOW (ref 3.5–5.0)
Alkaline Phosphatase: 198 U/L — ABNORMAL HIGH (ref 38–126)
Anion gap: 10 (ref 5–15)
BUN: 13 mg/dL (ref 8–23)
CO2: 21 mmol/L — ABNORMAL LOW (ref 22–32)
Calcium: 8.3 mg/dL — ABNORMAL LOW (ref 8.9–10.3)
Chloride: 110 mmol/L (ref 98–111)
Creatinine, Ser: 1.02 mg/dL (ref 0.61–1.24)
GFR, Estimated: >60 mL/min (ref >60)
Glucose, Bld: 156 mg/dL — ABNORMAL HIGH (ref 70–99)
Potassium: 3.7 mmol/L (ref 3.5–5.1)
Sodium: 141 mmol/L (ref 135–145)
Total Bilirubin: 0.5 mg/dL (ref 0.3–1.2)
Total Protein: 5.7 g/dL — ABNORMAL LOW (ref 6.5–8.1)

## 2023-01-01 LAB — CBC WITH DIFFERENTIAL/PLATELET
Abs Immature Granulocytes: 0.11 10*3/uL — ABNORMAL HIGH (ref 0.00–0.07)
Basophils Absolute: 0.1 10*3/uL (ref 0.0–0.1)
Basophils Relative: 0 %
Eosinophils Absolute: 0 10*3/uL (ref 0.0–0.5)
Eosinophils Relative: 0 %
HCT: 36 % — ABNORMAL LOW (ref 39.0–52.0)
Hemoglobin: 11.7 g/dL — ABNORMAL LOW (ref 13.0–17.0)
Immature Granulocytes: 1 %
Lymphocytes Relative: 5 %
Lymphs Abs: 0.9 10*3/uL (ref 0.7–4.0)
MCH: 31.7 pg (ref 26.0–34.0)
MCHC: 32.5 g/dL (ref 30.0–36.0)
MCV: 97.6 fL (ref 80.0–100.0)
Monocytes Absolute: 1.8 10*3/uL — ABNORMAL HIGH (ref 0.1–1.0)
Monocytes Relative: 9 %
Neutro Abs: 17.5 10*3/uL — ABNORMAL HIGH (ref 1.7–7.7)
Neutrophils Relative %: 85 %
Platelets: 490 10*3/uL — ABNORMAL HIGH (ref 150–400)
RBC: 3.69 MIL/uL — ABNORMAL LOW (ref 4.22–5.81)
RDW: 16.6 % — ABNORMAL HIGH (ref 11.5–15.5)
WBC: 20.5 10*3/uL — ABNORMAL HIGH (ref 4.0–10.5)
nRBC: 0 % (ref 0.0–0.2)

## 2023-01-01 LAB — RESP PANEL BY RT-PCR (RSV, FLU A&B, COVID)  RVPGX2
Influenza A by PCR: NEGATIVE
Influenza B by PCR: NEGATIVE
Resp Syncytial Virus by PCR: NEGATIVE
SARS Coronavirus 2 by RT PCR: NEGATIVE

## 2023-01-01 LAB — LACTIC ACID, PLASMA: Lactic Acid, Venous: 1.4 mmol/L (ref 0.5–1.9)

## 2023-01-01 LAB — HEPATITIS PANEL, ACUTE
HCV Ab: NONREACTIVE
Hep A IgM: NONREACTIVE
Hep B C IgM: NONREACTIVE
Hepatitis B Surface Ag: NONREACTIVE

## 2023-01-01 LAB — HIV ANTIBODY (ROUTINE TESTING W REFLEX): HIV Screen 4th Generation wRfx: NONREACTIVE

## 2023-01-01 LAB — BRAIN NATRIURETIC PEPTIDE: B Natriuretic Peptide: 985 pg/mL — ABNORMAL HIGH (ref 0.0–100.0)

## 2023-01-01 LAB — PROCALCITONIN: Procalcitonin: 0.37 ng/mL

## 2023-01-01 LAB — STREP PNEUMONIAE URINARY ANTIGEN: Strep Pneumo Urinary Antigen: NEGATIVE

## 2023-01-01 MED ORDER — LORAZEPAM 1 MG PO TABS
1.0000 mg | ORAL_TABLET | Freq: Once | ORAL | Status: AC
  Administered 2023-01-01: 1 mg via ORAL
  Filled 2023-01-01: qty 1

## 2023-01-01 MED ORDER — LEVALBUTEROL HCL 0.63 MG/3ML IN NEBU: 0.6300 mg | INHALATION_SOLUTION | Freq: Four times a day (QID) | RESPIRATORY_TRACT | Status: DC

## 2023-01-01 MED ORDER — CLOZAPINE 100 MG PO TABS
200.0000 mg | ORAL_TABLET | Freq: Two times a day (BID) | ORAL | Status: DC
  Administered 2023-01-02 – 2023-01-07 (×10): 200 mg via ORAL
  Filled 2023-01-01 (×12): qty 2

## 2023-01-01 MED ORDER — SODIUM CHLORIDE 0.9 % IV SOLN
2.0000 g | Freq: Once | INTRAVENOUS | Status: AC
  Administered 2023-01-01: 2 g via INTRAVENOUS
  Filled 2023-01-01: qty 20

## 2023-01-01 MED ORDER — MIRABEGRON ER 50 MG PO TB24
50.0000 mg | ORAL_TABLET | Freq: Every day | ORAL | Status: DC
  Administered 2023-01-02 – 2023-01-07 (×6): 50 mg via ORAL
  Filled 2023-01-01 (×6): qty 1

## 2023-01-01 MED ORDER — MELATONIN 10 MG PO TABS: 10.0000 mg | ORAL_TABLET | Freq: Every day | ORAL | Status: DC

## 2023-01-01 MED ORDER — CLOZAPINE 100 MG PO TABS: 200.0000 mg | ORAL_TABLET | Freq: Two times a day (BID) | ORAL | Status: DC

## 2023-01-01 MED ORDER — TRIMETHOBENZAMIDE HCL 100 MG/ML IM SOLN: 200.0000 mg | Freq: Four times a day (QID) | INTRAMUSCULAR | Status: DC | PRN

## 2023-01-01 MED ORDER — NICOTINE 14 MG/24HR TD PT24
14.0000 mg | MEDICATED_PATCH | Freq: Every day | TRANSDERMAL | Status: DC
  Administered 2023-01-02 – 2023-01-07 (×6): 14 mg via TRANSDERMAL
  Filled 2023-01-01 (×6): qty 1

## 2023-01-01 MED ORDER — LEVALBUTEROL HCL 0.63 MG/3ML IN NEBU
0.6300 mg | INHALATION_SOLUTION | Freq: Four times a day (QID) | RESPIRATORY_TRACT | Status: DC
  Administered 2023-01-01 – 2023-01-04 (×11): 0.63 mg via RESPIRATORY_TRACT
  Filled 2023-01-01 (×11): qty 3

## 2023-01-01 MED ORDER — ENOXAPARIN SODIUM 40 MG/0.4ML IJ SOSY
40.0000 mg | PREFILLED_SYRINGE | INTRAMUSCULAR | Status: DC
  Administered 2023-01-01 – 2023-01-06 (×6): 40 mg via SUBCUTANEOUS
  Filled 2023-01-01 (×7): qty 0.4

## 2023-01-01 MED ORDER — SODIUM CHLORIDE 0.9 % IV SOLN
500.0000 mg | Freq: Once | INTRAVENOUS | Status: AC
  Administered 2023-01-01: 500 mg via INTRAVENOUS
  Filled 2023-01-01: qty 5

## 2023-01-01 MED ORDER — METOCLOPRAMIDE HCL 10 MG PO TABS
5.0000 mg | ORAL_TABLET | Freq: Two times a day (BID) | ORAL | Status: DC
  Administered 2023-01-01 – 2023-01-07 (×12): 5 mg via ORAL
  Filled 2023-01-01 (×12): qty 1

## 2023-01-01 MED ORDER — GUAIFENESIN ER 600 MG PO TB12
600.0000 mg | ORAL_TABLET | Freq: Two times a day (BID) | ORAL | Status: DC
  Administered 2023-01-01 – 2023-01-07 (×12): 600 mg via ORAL
  Filled 2023-01-01 (×12): qty 1

## 2023-01-01 MED ORDER — FUROSEMIDE 10 MG/ML IJ SOLN
20.0000 mg | Freq: Two times a day (BID) | INTRAMUSCULAR | Status: DC
  Administered 2023-01-02: 20 mg via INTRAVENOUS
  Filled 2023-01-01: qty 2

## 2023-01-01 MED ORDER — ZOLPIDEM TARTRATE 5 MG PO TABS
10.0000 mg | ORAL_TABLET | Freq: Every day | ORAL | Status: DC
  Administered 2023-01-01 – 2023-01-06 (×6): 10 mg via ORAL
  Filled 2023-01-01 (×6): qty 2

## 2023-01-01 MED ORDER — LINACLOTIDE 145 MCG PO CAPS
290.0000 ug | ORAL_CAPSULE | Freq: Every day | ORAL | Status: DC
  Administered 2023-01-02 – 2023-01-07 (×6): 290 ug via ORAL
  Filled 2023-01-01 (×6): qty 2

## 2023-01-01 MED ORDER — ACETAMINOPHEN 650 MG RE SUPP: 650.0000 mg | Freq: Four times a day (QID) | RECTAL | Status: DC | PRN

## 2023-01-01 MED ORDER — PREDNISONE 20 MG PO TABS
40.0000 mg | ORAL_TABLET | Freq: Every day | ORAL | Status: AC
  Administered 2023-01-02 – 2023-01-05 (×4): 40 mg via ORAL
  Filled 2023-01-01 (×4): qty 2

## 2023-01-01 MED ORDER — IPRATROPIUM-ALBUTEROL 0.5-2.5 (3) MG/3ML IN SOLN
RESPIRATORY_TRACT | Status: AC
  Filled 2023-01-01: qty 3

## 2023-01-01 MED ORDER — AMANTADINE HCL 100 MG PO CAPS
100.0000 mg | ORAL_CAPSULE | Freq: Every day | ORAL | Status: DC
  Administered 2023-01-02 – 2023-01-06 (×5): 100 mg via ORAL
  Filled 2023-01-01 (×7): qty 1

## 2023-01-01 MED ORDER — FUROSEMIDE 10 MG/ML IJ SOLN
20.0000 mg | Freq: Once | INTRAMUSCULAR | Status: AC
  Administered 2023-01-01: 20 mg via INTRAVENOUS
  Filled 2023-01-01: qty 2

## 2023-01-01 MED ORDER — IPRATROPIUM-ALBUTEROL 0.5-2.5 (3) MG/3ML IN SOLN
3.0000 mL | Freq: Once | RESPIRATORY_TRACT | Status: AC
  Administered 2023-01-01: 3 mL via RESPIRATORY_TRACT

## 2023-01-01 MED ORDER — SODIUM CHLORIDE 0.9 % IV SOLN
2.0000 g | INTRAVENOUS | Status: AC
  Administered 2023-01-02 – 2023-01-05 (×4): 2 g via INTRAVENOUS
  Filled 2023-01-01 (×4): qty 20

## 2023-01-01 MED ORDER — SODIUM CHLORIDE 0.9 % IV SOLN
100.0000 mg | Freq: Two times a day (BID) | INTRAVENOUS | Status: AC
  Administered 2023-01-02 – 2023-01-05 (×8): 100 mg via INTRAVENOUS
  Filled 2023-01-01 (×8): qty 100

## 2023-01-01 MED ORDER — METHYLPREDNISOLONE SODIUM SUCC 125 MG IJ SOLR
125.0000 mg | Freq: Once | INTRAMUSCULAR | Status: AC
  Administered 2023-01-01: 125 mg via INTRAVENOUS
  Filled 2023-01-01: qty 2

## 2023-01-01 MED ORDER — ALBUTEROL SULFATE (2.5 MG/3ML) 0.083% IN NEBU
2.5000 mg | INHALATION_SOLUTION | RESPIRATORY_TRACT | Status: DC | PRN
  Administered 2023-01-01: 2.5 mg via RESPIRATORY_TRACT
  Filled 2023-01-01: qty 3

## 2023-01-01 MED ORDER — PANTOPRAZOLE SODIUM 40 MG PO TBEC
40.0000 mg | DELAYED_RELEASE_TABLET | Freq: Every evening | ORAL | Status: DC
  Administered 2023-01-02 – 2023-01-06 (×5): 40 mg via ORAL
  Filled 2023-01-01 (×6): qty 1

## 2023-01-01 MED ORDER — CALCIUM GLUCONATE-NACL 2-0.675 GM/100ML-% IV SOLN
2.0000 g | Freq: Once | INTRAVENOUS | Status: AC
  Administered 2023-01-01: 2000 mg via INTRAVENOUS
  Filled 2023-01-01: qty 100

## 2023-01-01 MED ORDER — METHYLPREDNISOLONE SODIUM SUCC 40 MG IJ SOLR: 40.0000 mg | Freq: Once | INTRAMUSCULAR | Status: DC

## 2023-01-01 MED ORDER — ACETAMINOPHEN 325 MG PO TABS
650.0000 mg | ORAL_TABLET | Freq: Four times a day (QID) | ORAL | Status: DC | PRN
  Administered 2023-01-01: 650 mg via ORAL
  Filled 2023-01-01: qty 2

## 2023-01-01 MED ORDER — CLOTRIMAZOLE 1 % EX CREA
TOPICAL_CREAM | Freq: Two times a day (BID) | CUTANEOUS | Status: DC
  Filled 2023-01-01: qty 15

## 2023-01-01 MED ORDER — PAROXETINE HCL 20 MG PO TABS
40.0000 mg | ORAL_TABLET | Freq: Every evening | ORAL | Status: DC
  Administered 2023-01-02 – 2023-01-06 (×5): 40 mg via ORAL
  Filled 2023-01-01 (×7): qty 2

## 2023-01-01 MED ORDER — SODIUM CHLORIDE 0.9% FLUSH
3.0000 mL | Freq: Two times a day (BID) | INTRAVENOUS | Status: DC
  Administered 2023-01-01 – 2023-01-07 (×12): 3 mL via INTRAVENOUS

## 2023-01-01 MED ORDER — MELATONIN 5 MG PO TABS
10.0000 mg | ORAL_TABLET | Freq: Every day | ORAL | Status: DC
  Administered 2023-01-02 – 2023-01-06 (×5): 10 mg via ORAL
  Filled 2023-01-01 (×5): qty 2

## 2023-01-01 MED ORDER — ALBUTEROL SULFATE HFA 108 (90 BASE) MCG/ACT IN AERS
2.0000 | INHALATION_SPRAY | RESPIRATORY_TRACT | Status: DC | PRN
  Administered 2023-01-01: 2 via RESPIRATORY_TRACT
  Filled 2023-01-01: qty 6.7

## 2023-01-01 MED ORDER — MAGNESIUM SULFATE 2 GM/50ML IV SOLN
2.0000 g | Freq: Once | INTRAVENOUS | Status: AC
  Administered 2023-01-01: 2 g via INTRAVENOUS
  Filled 2023-01-01: qty 50

## 2023-01-01 MED ORDER — TAMSULOSIN HCL 0.4 MG PO CAPS
0.4000 mg | ORAL_CAPSULE | Freq: Every day | ORAL | Status: DC
  Administered 2023-01-02 – 2023-01-07 (×6): 0.4 mg via ORAL
  Filled 2023-01-01 (×6): qty 1

## 2023-01-01 MED ORDER — SENNOSIDES-DOCUSATE SODIUM 8.6-50 MG PO TABS
1.0000 | ORAL_TABLET | Freq: Two times a day (BID) | ORAL | Status: DC
  Administered 2023-01-01 – 2023-01-04 (×4): 1 via ORAL
  Filled 2023-01-01 (×6): qty 1

## 2023-01-01 MED ORDER — ASPIRIN 81 MG PO TBEC
81.0000 mg | DELAYED_RELEASE_TABLET | Freq: Every day | ORAL | Status: DC
  Administered 2023-01-02 – 2023-01-07 (×6): 81 mg via ORAL
  Filled 2023-01-01 (×6): qty 1

## 2023-01-01 NOTE — Progress Notes (Signed)
Patient was noted to have worsening crackles on physical exam and with decreasing O2 saturations questioned if patient was acutely fluid overloaded.  He was ordered 20 mg of Lasix IV.  BNP had been pending at that time, but has since resulted and noted to be 985.  Orders placed to check echocardiogram and additional Lasix ordered IV.  Reassess in a.m. and adjust diuresis as needed

## 2023-01-01 NOTE — H&P (Addendum)
History and Physical    Patient: Jordan Kennedy [id number] DOB: 1956-10-10 DOA: 01/01/2023 DOS: the patient was seen and examined on 01/01/2023 PCP: Haydee Salter, MD  Patient coming from: Group home  Chief Complaint:  Chief Complaint  Patient presents with   Shortness of Breath   Cough   HPI: Jordan Kennedy is a 67 y.o. male with medical history significant of cognitive impairment, schizophrenia, tardive dyskinesia, chronic constipation, with prior history of SBO who presents with complaints of cough and shortness of breath.  History is obtained from the patient with assistance of his caregiver from the group home.  Symptoms initially started a week and a half ago with a mild nonproductive cough.  Reported associated symptoms of nasal congestion and weakness, but had not been having any fevers at that time.  He had been initially seen by his primary care provider on 3/5 for symptoms.  He was checked for COVID-19 which was negative at that time and was thought to have a viral upper respiratory infection which symptomatic treatment was recommended.  Over the last 2 days patient had been admitted to seem more short of breath walking up and down the steps.  His cough that seemed to become more productive.  Patient does have a history of smoking cigarettes.  At baseline he is normally ambulatory without need of assistance and is not on oxygen at baseline.  In the emergency department patient was noted to be febrile up to 101.4 F with tachycardia, tachypnea, and O2 saturation is as low as 84% on room air with improvement on nonrebreather.  Labs significant for WBC 20.5, hemoglobin 11.7, platelet 490, calcium 8.3, alkaline phosphatase 198, AST 59, and ALT 137.  Chest x-ray concerning for bilateral perihilar airspace disease right greater than left concerning for asymmetric pulmonary edema or diffuse infection.  Patient had initially been given given Rocephin, azithromycin, and Ativan 1 mg  p.o.  Review of Systems: As mentioned in the history of present illness. All other systems reviewed and are negative. Past Medical History:  Diagnosis Date   AKI (acute kidney injury) ([place]) 06/29/2018   Anemia    Anemia, iron deficiency 09/18/2015   Bowel obstruction ([facility])    BPH (benign prostatic hyperplasia)    Chronic constipation    History of ileus    Ileus ([place]) 03/07/2017   Mental retardation    Partial bowel obstruction ([place])    Schizophrenia ([place])    Monarche every three months;    SIRS (systemic inflammatory response syndrome) ([place]) 06/29/2018   Small bowel obstruction ([place]) 06/29/2018   History reviewed. No pertinent surgical history. Social History:  reports that he has been smoking cigarettes. He has a 7.50 pack-year smoking history. He has never used smokeless tobacco. He reports that he does not drink alcohol and does not use drugs.  No Known Allergies  Family History  Problem Relation Age of Onset   Cancer Mother        unknown type    Prior to Admission medications   Medication Sig Start Date End Date Taking? Authorizing Provider  amantadine (SYMMETREL) 100 MG capsule Take 100 mg by mouth at bedtime.     [provider]  aspirin EC (GNP ASPIRIN) 81 MG tablet Take 1 tablet (81 mg total) by mouth daily. 09/18/22   Haydee Salter, MD  clotrimazole (LOTRIMIN) 1 % cream Apply topically 2 (two) times daily. 05/18/22   Haydee Salter, MD  cloZAPine (CLOZARIL) 100 MG tablet Take 2 tablets (  200 mg total) by mouth 2 (two) times daily. 03/12/17   Eugenie Filler, MD  GNP MELATONIN 10 MG SUBL Take 1 tablet by mouth at bedtime. Patient not taking: Reported on 12/25/2022 02/17/22   [provider]  Rolan Lipa 290 MCG CAPS capsule TAKE ONE CAPSULE EACH DAY BEFORE BREAKFAST 07/20/22   Haydee Salter, MD  Melatonin 10 MG TABS Take 10 mg by mouth at bedtime.     [provider]  metoCLOPramide (REGLAN) 5 MG tablet TAKE ONE TABLET TWICE DAILY 03/09/22   Haydee Salter, MD  MYRBETRIQ 50 MG TB24 tablet Take 50 mg by mouth daily. 01/06/21   [provider]  naproxen (NAPROSYN) 500 MG tablet Take 1 tablet (500 mg total) by mouth 2 (two) times daily with a meal. Patient not taking: Reported on 10/16/2022 09/18/22   Haydee Salter, MD  omeprazole (PRILOSEC) 40 MG capsule TAKE ONE CAPSULE EVERY EVENING 11/15/22   Haydee Salter, MD  PARoxetine (PAXIL) 40 MG tablet Take 40 mg by mouth every evening.     [provider]  polyethylene glycol (MIRALAX / GLYCOLAX) packet Take 17 g by mouth daily. Patient not taking: Reported on 12/25/2022 07/10/18   Kayleen Memos, DO  polyethylene glycol powder (GLYCOLAX/MIRALAX) 17 GM/SCOOP powder 17G IN 8OZ OF FLUID OF CHOICE MONDAY AND THURSDAY 10/31/22   Haydee Salter, MD  SENNA PLUS 8.6-50 MG tablet TAKE ONE TABLET TWICE DAILY 08/28/22   Haydee Salter, MD  tamsulosin (FLOMAX) 0.4 MG CAPS capsule Take 0.4 mg by mouth daily. 01/10/21   [provider]  zolpidem (AMBIEN) 10 MG tablet Take 10 mg by mouth at bedtime.    [provider]    Physical Exam: Vitals:   01/01/23 1048 01/01/23 1048 01/01/23 1247  BP:  (!) 163/116 (!) 131/102  Pulse:  (!) 54 90  Resp:  (!) 32 (!) 35  Temp:  98.8 F (37.1 C) 99.8 F (37.7 C)  TempSrc:   Oral  SpO2:  97% 93%  Weight: 92.5 kg    Height: '6\' 2"'$  (1.88 m)     Exam  Constitutional: Older adult male who appears to be acutely ill Eyes: PERRL, lids and conjunctivae normal ENMT: Mucous membranes are moist.  Dentulous. Neck: normal, supple,   Respiratory: Tachypneic with decreased overall aeration with expiratory wheezes and intermittent crackles appreciated.  Patient currently on 6 L of nasal cannula oxygen with O2 saturations hovering in upper 80s. Cardiovascular: Tachycardic.  Trace lower extremity edema noted of the left lower extremity. Abdomen: no tenderness, no masses palpated. No hepatosplenomegaly. Bowel sounds positive.  Musculoskeletal:  no clubbing / cyanosis. No joint deformity upper and lower extremities. Good ROM, no contractures. Normal muscle tone.  Skin: no rashes, lesions, ulcers. No induration Neurologic: CN 2-12 grossly intact. Sensation intact, DTR normal. Strength 5/5 in all 4.  Psychiatric: Normal judgment and insight. Alert and oriented x 3. Normal mood.   Data Reviewed:  EKG reveals sinus tachycardia with remittent PVCs 112 bpm with QTc of 532.  Reviewed labs, imaging, and pertinent records as noted above in HPI  Assessment and Plan:  Acute respiratory failure with hypoxia  Sepsis secondary to pneumonia Patient presented with complaints of cough, shortness of breath, and weakness.  Noted to be febrile up to 100.4 F with tachycardia, tachypnea, and white blood cell count elevated 20.5 meeting SIRS criteria.  Saturations noted to be as low as 84% on room air and currently requiring  nonrebreather to maintain O2 saturations chest x-ray noted bilateral perihilar airspace disease right greater than left with new interval opacities concerning for pulmonary edema or diffuse infection. -Admit to a progressive bed -Continuous pulse oximetry with nasal cannula oxygen maintain O2 saturation greater than 92% -Incentive spirometry and flutter valve -BiPAP as needed -Check blood cultures and MRSA screen -Check procalcitonin, lactic acid, and BNP -Check ABG -Continue empiric antibiotics Rocephin  -Discontinue azithromycin due to prolonged QT interval.  Substitute doxycycline IV -Mucinex -Acetaminophen as needed for fever  Possible bronchitis due to tobacco use Patient was noted to be wheezing on physical exam.  Patient does have a history of smoking tobacco.  No formal pulmonary function test on file.  He had been given Decadron IV in the emergency department. -Schedule levalbuterol breathing treatments and albuterol nebs as needed -Solu-Medrol 125 mg IV x 1 dose , then prednisone daily  Prolonged QT interval Acute.   QTc was noted to be prolonged at 532, but patient was noted to be tachycardic at that time. -QT prolonging medications. -Recheck QTc in a.m.  Normocytic anemia Hemoglobin 11.7 g/dL.  Last available hemoglobin was 13.9 back in 01/2021.  No reports of bleeding. -Recheck CBC tomorrow morning.  Transaminitis Acute.  On admission alkaline phosphatase 198, AST 59, and ALT 137. -Check hepatitis panel -Check LFTs in a.m.  Intellectual disability Patient currently lives at a group home due to cognitive impairment. -Tourist information centre manager  Schizophrenia -Clozapine deferred to tomorrow due to prolonged QT interval  Thrombocytosis Acute.  Platelet count elevated at 490.  Thought reactive secondary to above. -Recheck CBC tomorrow morning  Chronic constipation -Continue senna-docusate, Linzess  Tobacco abuse Patient still reports smoking cigarettes. -Nicotine patch offered   DVT prophylaxis: Lovenox Advance Care Planning:   Code Status: Full Code  Consults: None  Family Communication: Caregiver updated at bedside  Severity of Illness: The appropriate patient status for this patient is INPATIENT. Inpatient status is judged to be reasonable and necessary in order to provide the required intensity of service to ensure the patient's safety. The patient's presenting symptoms, physical exam findings, and initial radiographic and laboratory data in the context of their chronic comorbidities is felt to place them at high risk for further clinical deterioration. Furthermore, it is not anticipated that the patient will be medically stable for discharge from the hospital within 2 midnights of admission.   * I certify that at the point of admission it is my clinical judgment that the patient will require inpatient hospital care spanning beyond 2 midnights from the point of admission due to high intensity of service, high risk for further deterioration and high frequency of surveillance  required.*  Author: Norval Morton, MD 01/01/2023 2:47 PM  For on call review [email or web address].

## 2023-01-01 NOTE — ED Provider Notes (Signed)
[facility]   Note:  This document was prepared using Dragon voice recognition software and may include unintentional dictation errors.  MRN: [id number] DOB: 1956/07/25 DATE: 01/01/23   Subjective:  Chief Complaint:  Chief Complaint  Patient presents with   Cough   Shortness of Breath     HPI: Jordan Kennedy is a 67 y.o. male presenting for shortness of breath and cough for the past 1.5 weeks.  Patient was seen by his PCP last week and diagnosed with a viral URI.  COVID was negative at that time.  He had continued to get worse since then.  Reports increased shortness of breath, fatigue, and weakness.  He is an active smoker smoking only 3 cigarettes a day.  Presented in acute distress with guardian.   Prior to Admission medications   Medication Sig Start Date End Date Taking? Authorizing Provider  amantadine (SYMMETREL) 100 MG capsule Take 100 mg by mouth at bedtime.     [provider]  aspirin EC (GNP ASPIRIN) 81 MG tablet Take 1 tablet (81 mg total) by mouth daily. 09/18/22   Haydee Salter, MD  clotrimazole (LOTRIMIN) 1 % cream Apply topically 2 (two) times daily. 05/18/22   Haydee Salter, MD  cloZAPine (CLOZARIL) 100 MG tablet Take 2 tablets (200 mg total) by mouth 2 (two) times daily. 03/12/17   Eugenie Filler, MD  GNP MELATONIN 10 MG SUBL Take 1 tablet by mouth at bedtime. Patient not taking: Reported on 12/25/2022 02/17/22   [provider]  Rolan Lipa 290 MCG CAPS capsule TAKE ONE CAPSULE EACH DAY BEFORE BREAKFAST 07/20/22   Haydee Salter, MD  Melatonin 10 MG TABS Take 10 mg by mouth at bedtime.     [provider]  metoCLOPramide (REGLAN) 5 MG tablet TAKE ONE TABLET TWICE DAILY 03/09/22   Haydee Salter, MD  MYRBETRIQ 50 MG TB24 tablet Take 50 mg by mouth daily. 01/06/21   [provider]  naproxen (NAPROSYN) 500 MG tablet Take 1 tablet (500 mg total) by mouth 2 (two) times daily with a meal. Patient not taking: Reported on  10/16/2022 09/18/22   Haydee Salter, MD  omeprazole (PRILOSEC) 40 MG capsule TAKE ONE CAPSULE EVERY EVENING 11/15/22   Haydee Salter, MD  PARoxetine (PAXIL) 40 MG tablet Take 40 mg by mouth every evening.     [provider]  polyethylene glycol (MIRALAX / GLYCOLAX) packet Take 17 g by mouth daily. Patient not taking: Reported on 12/25/2022 07/10/18   Kayleen Memos, DO  polyethylene glycol powder (GLYCOLAX/MIRALAX) 17 GM/SCOOP powder 17G IN 8OZ OF FLUID OF CHOICE MONDAY AND THURSDAY 10/31/22   Haydee Salter, MD  SENNA PLUS 8.6-50 MG tablet TAKE ONE TABLET TWICE DAILY 08/28/22   Haydee Salter, MD  tamsulosin (FLOMAX) 0.4 MG CAPS capsule Take 0.4 mg by mouth daily. 01/10/21   [provider]  zolpidem (AMBIEN) 10 MG tablet Take 10 mg by mouth at bedtime.    [provider]     No Known Allergies  History:   Past Medical History:  Diagnosis Date   AKI (acute kidney injury) ([place]) 06/29/2018   Anemia    Anemia, iron deficiency 09/18/2015   Bowel obstruction ([facility])    BPH (benign prostatic hyperplasia)    Chronic constipation    History of ileus    Ileus ([place]) 03/07/2017   Mental retardation    Partial bowel obstruction ([place])    Schizophrenia ([place])  Monarche every three months;    SIRS (systemic inflammatory response syndrome) ([place]) 06/29/2018   Small bowel obstruction ([place]) 06/29/2018     History reviewed. No pertinent surgical history.  Family History  Problem Relation Age of Onset   Cancer Mother        unknown type    Social History   Tobacco Use   Smoking status: Every Day    Packs/day: 0.25    Years: 30.00    Total pack years: 7.50    Types: Cigarettes   Smokeless tobacco: Never  Vaping Use   Vaping Use: Never used  Substance Use Topics   Alcohol use: No   Drug use: No    Review of Systems  Constitutional:  Positive for fatigue and fever.  Respiratory:  Positive for cough and shortness of breath.   Neurological:  Positive for  weakness.     Objective:   Vitals: BP (!) 138/93   Pulse (!) 114   Temp (!) 100.4 F (38 C)   Resp (!) 40   SpO2 (!) 84%   Physical Exam Constitutional:      General: He is in acute distress.     Appearance: He is well-developed and normal weight. He is ill-appearing.  HENT:     Head: Normocephalic and atraumatic.  Cardiovascular:     Rate and Rhythm: Regular rhythm. Tachycardia present.     Heart sounds: Normal heart sounds.  Pulmonary:     Effort: Tachypnea present.     Breath sounds: Decreased air movement present. Examination of the left-lower field reveals rhonchi. Decreased breath sounds and rhonchi present.     Comments: Decreased breath sounds heard throughout all lung fields.  Rhonchi/crackles heard on the left lower lobe. Skin:    General: Skin is warm and dry.  Neurological:     Mental Status: He is alert.     Results:  Labs: No results found for this or any previous visit (from the past 24 hour(s)).  Radiology: No results found.   UC Course/Treatments:  Procedures: Procedures   Medications Ordered in UC: Medications  ipratropium-albuterol (DUONEB) 0.5-2.5 (3) MG/3ML nebulizer solution 3 mL (has no administration in time range)     Assessment and Plan :     ICD-10-CM   1. Acute respiratory failure with hypoxia (HCC)  J96.01     2. Shortness of breath  R06.02     3. Fever, unspecified fever cause  R50.9       Acute Respiratory Failure with Hypoxia: Febrile, Acute Distress, Patient was placed on oxygen and transferred to ED via [facility]. Patient stable for transfer via EMS. DDX includes but not limited to: Sepsis, Pneumonia, PE Patient presented to urgent care complaining of shortness of breath and cough.   During triage, patient was 84% on room air and tachycardic with HR of 114.  He was placed on 4 L of oxygen and pulse ox increased to 93% and HR of 101.  He was febrile with a MAX temp of 100.4 on presentation.  DuoNeb was administered and  peripheral IV started prior to discharge.  On exam, patient was noted to be tachycardic and tachypneic with decreased breath sounds heard throughout.  Crackles/rhonchi noted on left lower lobe.  Given hypoxia and acute respiratory failure, it is recommend that patient go directly to the ER for further evaluation/treatment/observation. Concern for possible sepsis secondary to pneumonia.   Patient is stable for discharge to the emergency department via [place]. he will be  going to [facility] ER.  EMS arrived at 10:35 AM and patient was transferred to there care. Report was given to EMTs.    ED Discharge Orders     None        PDMP not reviewed this encounter.     Darsi Tien P, PA-C 01/01/23 1038

## 2023-01-01 NOTE — ED Notes (Signed)
Pt's O2 noted to not recover when placed back on 3L O2.  O2 increased to 6L and Pt is at 89%.  Also, Pt's heart rate has increased to 120s.  Hospitalist made aware.

## 2023-01-01 NOTE — ED Triage Notes (Signed)
Pt arrives via EMS after being seen at [facility]. O2 was in the mid 80s. They gave one duoneb and placed him on 3Lnc. Pt reports sob and trouble breathing today, reports cough since last week. Pt is AxO, he does have his caregiver with him.

## 2023-01-01 NOTE — ED Notes (Signed)
RN paged admitting MD. RN requested bedside reevaluation due to change in patient condition. Pt moved from hallway to room 16. Pt is now requiring 6L [place] to maintain oxygen saturation of 90% from 3L [place]. Pt is tachycardic 115-130 and tachypneic 30-40.

## 2023-01-01 NOTE — ED Provider Notes (Signed)
[facility] Provider Note   CSN: [id number] Arrival date & time: 01/01/23  1044     History  Chief Complaint  Patient presents with   Shortness of Breath   Cough    Jordan Kennedy is a 67 y.o. male.   Shortness of Breath Associated symptoms: cough   Cough Associated symptoms: shortness of breath   Patient presents with shortness of breath.  Went to urgent care.  Has had URI symptoms over the last week.  Found to have sats in the mid 80s for them.  Not on oxygen at baseline.  Cough since last week.  Negative viral testing then.  Now came from urgent care with worsening hypoxia.  Comes with patient's caregiver.  Has had some cough.  States he does not feel good.    Past Medical History:  Diagnosis Date   AKI (acute kidney injury) ([place]) 06/29/2018   Anemia    Anemia, iron deficiency 09/18/2015   Bowel obstruction (HCC)    BPH (benign prostatic hyperplasia)    Chronic constipation    History of ileus    Ileus ([name]) 03/07/2017   Mental retardation    Partial bowel obstruction ([place])    Schizophrenia ([place])    Monarche every three months;    SIRS (systemic inflammatory response syndrome) ([place]) 06/29/2018   Small bowel obstruction ([place]) 06/29/2018    Home Medications Prior to Admission medications   Medication Sig Start Date End Date Taking? Authorizing Provider  amantadine (SYMMETREL) 100 MG capsule Take 100 mg by mouth at bedtime.     [provider]  aspirin EC (GNP ASPIRIN) 81 MG tablet Take 1 tablet (81 mg total) by mouth daily. 09/18/22   Haydee Salter, MD  clotrimazole (LOTRIMIN) 1 % cream Apply topically 2 (two) times daily. 05/18/22   Haydee Salter, MD  cloZAPine (CLOZARIL) 100 MG tablet Take 2 tablets (200 mg total) by mouth 2 (two) times daily. 03/12/17   Eugenie Filler, MD  GNP MELATONIN 10 MG SUBL Take 1 tablet by mouth at bedtime. Patient not taking: Reported on 12/25/2022 02/17/22   [provider]   Rolan Lipa 290 MCG CAPS capsule TAKE ONE CAPSULE EACH DAY BEFORE BREAKFAST 07/20/22   Haydee Salter, MD  Melatonin 10 MG TABS Take 10 mg by mouth at bedtime.     [provider]  metoCLOPramide (REGLAN) 5 MG tablet TAKE ONE TABLET TWICE DAILY 03/09/22   Haydee Salter, MD  MYRBETRIQ 50 MG TB24 tablet Take 50 mg by mouth daily. 01/06/21   [provider]  naproxen (NAPROSYN) 500 MG tablet Take 1 tablet (500 mg total) by mouth 2 (two) times daily with a meal. Patient not taking: Reported on 10/16/2022 09/18/22   Haydee Salter, MD  omeprazole (PRILOSEC) 40 MG capsule TAKE ONE CAPSULE EVERY EVENING 11/15/22   Haydee Salter, MD  PARoxetine (PAXIL) 40 MG tablet Take 40 mg by mouth every evening.     [provider]  polyethylene glycol (MIRALAX / GLYCOLAX) packet Take 17 g by mouth daily. Patient not taking: Reported on 12/25/2022 07/10/18   Kayleen Memos, DO  polyethylene glycol powder (GLYCOLAX/MIRALAX) 17 GM/SCOOP powder 17G IN 8OZ OF FLUID OF CHOICE MONDAY AND THURSDAY 10/31/22   Haydee Salter, MD  SENNA PLUS 8.6-50 MG tablet TAKE ONE TABLET TWICE DAILY 08/28/22   Haydee Salter, MD  tamsulosin (FLOMAX) 0.4 MG CAPS capsule Take 0.4 mg by mouth  daily. 01/10/21   [provider]  zolpidem (AMBIEN) 10 MG tablet Take 10 mg by mouth at bedtime.    [provider]      Allergies    Patient has no known allergies.    Review of Systems   Review of Systems  Respiratory:  Positive for cough and shortness of breath.     Physical Exam Updated Vital Signs BP (!) 131/102   Pulse 90   Temp 99.8 F (37.7 C) (Oral)   Resp (!) 35   Ht '6\' 2"'$  (1.88 m)   Wt 92.5 kg   SpO2 93%   BMI 26.19 kg/m  Physical Exam Vitals and nursing note reviewed.  Cardiovascular:     Rate and Rhythm: Regular rhythm.  Pulmonary:     Comments: Harsh breath sounds with some tachypnea. Chest:     Chest wall: No tenderness.  Abdominal:     Tenderness: There is no abdominal  tenderness.  Musculoskeletal:     Right lower leg: No edema.     Left lower leg: No edema.  Skin:    General: Skin is warm.  Neurological:     Mental Status: He is alert.     ED Results / Procedures / Treatments   Labs (all labs ordered are listed, but only abnormal results are displayed) Labs Reviewed  CBC WITH DIFFERENTIAL/PLATELET - Abnormal; Notable for the following components:      Result Value   WBC 20.5 (*)    RBC 3.69 (*)    Hemoglobin 11.7 (*)    HCT 36.0 (*)    RDW 16.6 (*)    Platelets 490 (*)    Neutro Abs 17.5 (*)    Monocytes Absolute 1.8 (*)    Abs Immature Granulocytes 0.11 (*)    All other components within normal limits  COMPREHENSIVE METABOLIC PANEL - Abnormal; Notable for the following components:   CO2 21 (*)    Glucose, Bld 156 (*)    Calcium 8.3 (*)    Total Protein 5.7 (*)    Albumin 2.7 (*)    AST 59 (*)    ALT 137 (*)    Alkaline Phosphatase 198 (*)    All other components within normal limits  RESP PANEL BY RT-PCR (RSV, FLU A&B, COVID)  [id number]    EKG None  Radiology DG Chest 1 View  Result Date: 01/01/2023 CLINICAL DATA:  Shortness of breath and trouble breathing. EXAM: CHEST  1 VIEW COMPARISON:  06/29/2018 FINDINGS: The cardio pericardial silhouette is enlarged. Bilateral parahilar airspace disease, right greater than left, is new in the interval. Possible trace bilateral pleural effusions. The visualized bony structures of the thorax are unremarkable. IMPRESSION: Bilateral parahilar airspace disease, right greater than left, is new in the interval. Imaging features compatible with asymmetric pulmonary edema or diffuse infection. Electronically Signed   By: Misty Stanley M.D.   On: 01/01/2023 11:32    Procedures Procedures    Medications Ordered in ED Medications  albuterol (VENTOLIN HFA) 108 (90 Base) MCG/ACT inhaler 2 puff (has no administration in time range)  azithromycin (ZITHROMAX) 500 mg in sodium chloride 0.9 % 250 mL IVPB  (has no administration in time range)  cefTRIAXone (ROCEPHIN) 2 g in sodium chloride 0.9 % 100 mL IVPB (0 g Intravenous Stopped 01/01/23 1350)    ED Course/ Medical Decision Making/ A&P  Medical Decision Making Amount and/or Complexity of Data Reviewed Labs: ordered. Radiology: ordered.  Risk Prescription drug management.   Patient is shortness of breath cough and hypoxia.  Recently seen at urgent care and diagnosed with viral syndrome.  However I think this is more likely pneumonia.  Differential diagnosis includes pulmonary infections and CHF.  However chest x-ray independently interpreted and shows infiltrate on right side.  I think likely pneumonia.  White count is elevated.  Antibiotics will be given.  With oxygen requirement will likely acquire admission to the hospital.  CMP overall reassuring although LFTs mildly elevated.  Will treat with antibiotics and admit to hospital. Does not appear septic at this time.         Final Clinical Impression(s) / ED Diagnoses Final diagnoses:  Community acquired pneumonia, unspecified laterality  Hypoxia    Rx / DC Orders ED Discharge Orders     None         Davonna Belling, MD 01/01/23 1350

## 2023-01-01 NOTE — ED Notes (Signed)
Pt repeatedly stating that he feels like he has to pee, but can't. Pt requesting to be straight cathed. MD paged.

## 2023-01-01 NOTE — Discharge Instructions (Signed)
Patient stable for transfer to ED for hypoxia and acute respiratory failure.

## 2023-01-01 NOTE — ED Notes (Addendum)
Patient is being discharged from the Urgent Care and sent to the Emergency Department via EMS . Per Ashlee PA, patient is in need of higher level of care due to respiratory distress. Patient is aware and verbalizes understanding of plan of care. Report given to [facility] in ED. Vitals:   01/01/23 1006 01/01/23 1031  BP: (!) 138/93   Pulse: (!) 114 (!) 115  Resp: (!) 40   Temp: (!) 100.4 F (38 C)   SpO2: (!) 84% 91%

## 2023-01-01 NOTE — ED Notes (Signed)
Pt's caregiver left department long enough to get phone charger.  While gone, Pt got out of bed, ripped out IV, and ripped of oxygen.  Pt difficult to redirect back into bed.  Pt very anxious and difficult to redirect.  EDP made aware.

## 2023-01-01 NOTE — ED Notes (Signed)
Attempted to administer puffs of inhaler.  Pt unable to following instructions well.

## 2023-01-01 NOTE — ED Notes (Signed)
Pt on 6L [place], 10L neb treatment, oxygen saturations maintain 89-91%. Oral temp 101.4. Admitting MD aware.

## 2023-01-01 NOTE — ED Triage Notes (Signed)
Pt presents with complaints of shortness of breath and cough x 3 days. Reports he was seen last week and checked out but symptoms are worse.

## 2023-01-01 NOTE — ED Notes (Signed)
Pt tolerating being off bipap well. Oxygen saturations remain in >92% on 5L [place]. Pt was able to eat, drink, and take ordered PO medications with out incident.

## 2023-01-01 NOTE — ED Notes (Signed)
RN obtained order for in and out cath. 600cc drained from bladder. With MD permission and pt request, trailing off bipap at this time. Pt is much more awake at this time than earlier, stating he is able to breath much better. Pt is satting 94% on 5L [place], no distress.

## 2023-01-02 ENCOUNTER — Inpatient Hospital Stay (HOSPITAL_COMMUNITY): Payer: Medicare Other

## 2023-01-02 DIAGNOSIS — I5021 Acute systolic (congestive) heart failure: Secondary | ICD-10-CM | POA: Diagnosis not present

## 2023-01-02 DIAGNOSIS — J189 Pneumonia, unspecified organism: Secondary | ICD-10-CM | POA: Insufficient documentation

## 2023-01-02 DIAGNOSIS — A419 Sepsis, unspecified organism: Secondary | ICD-10-CM | POA: Diagnosis not present

## 2023-01-02 LAB — ECHOCARDIOGRAM COMPLETE
AR max vel: 2.1 cm2
AV Area VTI: 2.22 cm2
AV Area mean vel: 1.95 cm2
AV Mean grad: 4 mmHg
AV Peak grad: 7.7 mmHg
Ao pk vel: 1.39 m/s
Area-P 1/2: 3.79 cm2
Height: 74 in
MV M vel: 4.36 m/s
MV Peak grad: 76 mmHg
MV VTI: 2.88 cm2
S' Lateral: 5.4 cm
Weight: 3264 oz

## 2023-01-02 LAB — RESPIRATORY PANEL BY PCR

## 2023-01-02 LAB — COMPREHENSIVE METABOLIC PANEL
ALT: 131 U/L — ABNORMAL HIGH (ref 0–44)
AST: 49 U/L — ABNORMAL HIGH (ref 15–41)
Albumin: 2.4 g/dL — ABNORMAL LOW (ref 3.5–5.0)
Alkaline Phosphatase: 189 U/L — ABNORMAL HIGH (ref 38–126)
Anion gap: 12 (ref 5–15)
BUN: 13 mg/dL (ref 8–23)
CO2: 23 mmol/L (ref 22–32)
Calcium: 8.6 mg/dL — ABNORMAL LOW (ref 8.9–10.3)
Chloride: 104 mmol/L (ref 98–111)
Creatinine, Ser: 1.01 mg/dL (ref 0.61–1.24)
GFR, Estimated: >60 mL/min (ref >60)
Glucose, Bld: 165 mg/dL — ABNORMAL HIGH (ref 70–99)
Potassium: 4.2 mmol/L (ref 3.5–5.1)
Sodium: 139 mmol/L (ref 135–145)
Total Bilirubin: 0.5 mg/dL (ref 0.3–1.2)
Total Protein: 5.9 g/dL — ABNORMAL LOW (ref 6.5–8.1)

## 2023-01-02 LAB — CBC
HCT: 35.9 % — ABNORMAL LOW (ref 39.0–52.0)
Hemoglobin: 11.7 g/dL — ABNORMAL LOW (ref 13.0–17.0)
MCH: 31 pg (ref 26.0–34.0)
MCHC: 32.6 g/dL (ref 30.0–36.0)
MCV: 95.2 fL (ref 80.0–100.0)
Platelets: 472 10*3/uL — ABNORMAL HIGH (ref 150–400)
RBC: 3.77 MIL/uL — ABNORMAL LOW (ref 4.22–5.81)
RDW: 16.5 % — ABNORMAL HIGH (ref 11.5–15.5)
WBC: 19.3 10*3/uL — ABNORMAL HIGH (ref 4.0–10.5)
nRBC: 0 % (ref 0.0–0.2)

## 2023-01-02 LAB — MAGNESIUM: Magnesium: 2.3 mg/dL (ref 1.7–2.4)

## 2023-01-02 MED ORDER — PERFLUTREN LIPID MICROSPHERE
1.0000 mL | INTRAVENOUS | Status: AC | PRN
  Administered 2023-01-02: 5 mL via INTRAVENOUS

## 2023-01-02 MED ORDER — FUROSEMIDE 10 MG/ML IJ SOLN
40.0000 mg | Freq: Two times a day (BID) | INTRAMUSCULAR | Status: DC
  Administered 2023-01-02 – 2023-01-07 (×10): 40 mg via INTRAVENOUS
  Filled 2023-01-02 (×10): qty 4

## 2023-01-02 NOTE — Progress Notes (Signed)
PROGRESS NOTE    Jordan Kennedy  [id number] DOB: [birth year] DOA: 01/01/2023 PCP: Haydee Salter, MD   Brief Narrative:  HPI: Jordan Kennedy is a 67 y.o. male with medical history significant of cognitive impairment, schizophrenia, tardive dyskinesia, chronic constipation, with prior history of SBO who presents with complaints of cough and shortness of breath.  History is obtained from the patient with assistance of his caregiver from the group home.  Symptoms initially started a week and a half ago with a mild nonproductive cough.  Reported associated symptoms of nasal congestion and weakness, but had not been having any fevers at that time.  He had been initially seen by his primary care provider on 3/5 for symptoms.  He was checked for COVID-19 which was negative at that time and was thought to have a viral upper respiratory infection which symptomatic treatment was recommended.  Over the last 2 days patient had been admitted to seem more short of breath walking up and down the steps.  His cough that seemed to become more productive.  Patient does have a history of smoking cigarettes.  At baseline he is normally ambulatory without need of assistance and is not on oxygen at baseline.   In the emergency department patient was noted to be febrile up to 101.4 F with tachycardia, tachypnea, and O2 saturation is as low as 84% on room air with improvement on nonrebreather.  Labs significant for WBC 20.5, hemoglobin 11.7, platelet 490, calcium 8.3, alkaline phosphatase 198, AST 59, and ALT 137.  Chest x-ray concerning for bilateral perihilar airspace disease right greater than left concerning for asymmetric pulmonary edema or diffuse infection.  Patient had initially been given given Rocephin, azithromycin, and Ativan 1 mg p.o.  Assessment & Plan:   Principal Problem:   Sepsis due to pneumonia [facility]) Active Problems:   Acute respiratory failure with hypoxia (HCC)   Bronchitis due to tobacco use    Prolonged QT interval   Normocytic anemia   Transaminitis   Intellectual disability   Schizophrenia (HCC)   Thrombocytosis   Chronic constipation   Acute exacerbation of congestive heart failure (HCC)  Acute respiratory failure with hypoxia and severe sepsis secondary to community-acquired pneumonia: Noted to be febrile up to 101.4 F with tachycardia, tachypnea, and white blood cell count elevated 20.5 and acute hypoxic respiratory failure.  Saturations noted to be as low as 84% on room air and required nonrebreather but only requiring 4 L of oxygen and maintaining saturation around 92%. chest x-ray noted bilateral perihilar airspace disease right greater than left with new interval opacities concerning for pulmonary edema or diffuse infection.  Procalcitonin 0.37 but BNP significantly elevated at 985 indicates that he has both acute on chronic congestive heart failure and possibly bacterial pneumonia but more so CHF.  Echo is pending.  Patient is still feels short of breath.  On exam he has crackles but no rhonchi.  He received a dose of Lasix yesterday.  I will put him on Lasix IV 40 mg twice daily.  Strict I's and O's and daily weight along with low-sodium diet.  Continue empiric antibiotics with Rocephin and doxycycline.  Follow cultures and tailor antibiotics.   Possible bronchitis due to tobacco use: Patient was noted to be wheezing on physical exam at the time of admission.  Patient does have a history of smoking tobacco.  No formal pulmonary function test on file.  He had been given Decadron IV in the emergency department.  He was started  on prednisone.  I will continue that.   Prolonged QT interval Acute.  QTc was noted to be prolonged at 532, but patient was noted to be tachycardic at that time. -Avoid QT prolonging medications.  Will repeat EKG in the morning.   Normocytic anemia Hemoglobin stable.   Transaminitis/elevated LFTs Acute.  On admission alkaline phosphatase 198, AST 59,  and ALT 137.  Viral hepatitis panel negative.  LFTs improving.  Intellectual disability Patient currently lives at a group home due to cognitive impairment. Continue sitter   Schizophrenia -Clozapine deferred  due to prolonged QT interval   Thrombocytosis Acute.  Platelet count elevated at 490.  Thought reactive secondary to above.  Improving.   Chronic constipation -Continue senna-docusate, Linzess   Tobacco abuse Patient still reports smoking cigarettes. -Nicotine patch offered  DVT prophylaxis: enoxaparin (LOVENOX) injection 40 mg Start: 01/01/23 1600   Code Status: Full Code  Family Communication:  None present at bedside.   Status is: Inpatient Remains inpatient appropriate because: Patient still hypoxic and requires hospitalization.   Estimated body mass index is 26.19 kg/m as calculated from the following:   Height as of this encounter: '6\' 2"'$  (1.88 m).   Weight as of this encounter: 92.5 kg.    Nutritional Assessment: Body mass index is 26.19 kg/m.Marland Kitchen Seen by dietician.  I agree with the assessment and plan as outlined below: Nutrition Status:        . Skin Assessment: I have examined the patient's skin and I agree with the wound assessment as performed by the wound care RN as outlined below:    Consultants:  None  Procedures:  None  Antimicrobials:  Anti-infectives (From admission, onward)    Start     Dose/Rate Route Frequency Ordered Stop   01/02/23 1000  cefTRIAXone (ROCEPHIN) 2 g in sodium chloride 0.9 % 100 mL IVPB        2 g 200 mL/hr over 30 Minutes Intravenous Every 24 hours 01/01/23 1458 01/06/23 0959   01/02/23 1000  doxycycline (VIBRAMYCIN) 100 mg in sodium chloride 0.9 % 250 mL IVPB        100 mg 125 mL/hr over 120 Minutes Intravenous Every 12 hours 01/01/23 1458     01/01/23 1200  cefTRIAXone (ROCEPHIN) 2 g in sodium chloride 0.9 % 100 mL IVPB        2 g 200 mL/hr over 30 Minutes Intravenous  Once 01/01/23 1159 01/01/23 1350    01/01/23 1200  azithromycin (ZITHROMAX) 500 mg in sodium chloride 0.9 % 250 mL IVPB        500 mg 250 mL/hr over 60 Minutes Intravenous  Once 01/01/23 1159 01/01/23 1523         Subjective: Patient seen and examined in the ED.  Still complains of shortness of breath.  Despite of having cognitive deficit, he appears to be very alert and very much oriented.  Objective: Vitals:   01/02/23 0737 01/02/23 0800 01/02/23 0812 01/02/23 0850  BP:  120/89    Pulse:  98    Resp:  (!) 21    Temp:   98.8 F (37.1 C)   TempSrc:   Oral   SpO2: 95% 93%  92%  Weight:      Height:        Intake/Output Summary (Last 24 hours) at 01/02/2023 1018 Last data filed at 01/02/2023 0910 Gross per 24 hour  Intake 600 ml  Output 1100 ml  Net -500 ml   [employer]  01/01/23 1048  Weight: 92.5 kg    Examination:  General exam: Appears calm and comfortable  Respiratory system: Bibasilar crackles.  Respiratory effort normal. Cardiovascular system: S1 & S2 heard, RRR. No JVD, murmurs, rubs, gallops or clicks. No pedal edema. Gastrointestinal system: Abdomen is nondistended, soft and nontender. No organomegaly or masses felt. Normal bowel sounds heard. Central nervous system: Alert and oriented. No focal neurological deficits. Extremities: Symmetric 5 x 5 power. Skin: No rashes, lesions or ulcers  Data Reviewed: I have personally reviewed following labs and imaging studies  CBC: Recent Labs  Lab 01/01/23 1055 01/01/23 1716 01/02/23 0434  WBC 20.5*  --  19.3*  NEUTROABS 17.5*  --   --   HGB 11.7* 11.2* 11.7*  HCT 36.0* 33.0* 35.9*  MCV 97.6  --  95.2  PLT 490*  --  99991111*   Basic Metabolic Panel: Recent Labs  Lab 01/01/23 1055 01/01/23 1716 01/02/23 0434  NA 141 143 139  K 3.7 3.8 4.2  CL 110  --  104  CO2 21*  --  23  GLUCOSE 156*  --  165*  BUN 13  --  13  CREATININE 1.02  --  1.01  CALCIUM 8.3*  --  8.6*  MG  --   --  2.3   GFR: Estimated Creatinine Clearance: 85.9 mL/min  (by C-G formula based on SCr of 1.01 mg/dL). Liver Function Tests: Recent Labs  Lab 01/01/23 1055 01/02/23 0434  AST 59* 49*  ALT 137* 131*  ALKPHOS 198* 189*  BILITOT 0.5 0.5  PROT 5.7* 5.9*  ALBUMIN 2.7* 2.4*   No results for input(s): "LIPASE", "AMYLASE" in the last 168 hours. No results for input(s): "AMMONIA" in the last 168 hours. Coagulation Profile: No results for input(s): "INR", "PROTIME" in the last 168 hours. Cardiac Enzymes: No results for input(s): "CKTOTAL", "CKMB", "CKMBINDEX", "TROPONINI" in the last 168 hours. BNP (last 3 results) No results for input(s): "PROBNP" in the last 8760 hours. HbA1C: No results for input(s): "HGBA1C" in the last 72 hours. CBG: Recent Labs  Lab 01/01/23 1622  GLUCAP 134*   Lipid Profile: No results for input(s): "CHOL", "HDL", "LDLCALC", "TRIG", "CHOLHDL", "LDLDIRECT" in the last 72 hours. Thyroid Function Tests: No results for input(s): "TSH", "T4TOTAL", "FREET4", "T3FREE", "THYROIDAB" in the last 72 hours. Anemia Panel: No results for input(s): "VITAMINB12", "FOLATE", "FERRITIN", "TIBC", "IRON", "RETICCTPCT" in the last 72 hours. Sepsis Labs: Recent Labs  Lab 01/01/23 1620 01/01/23 1630  PROCALCITON 0.37  --   LATICACIDVEN  --  1.4    Recent Results (from the past 240 hour(s))  Resp panel by RT-PCR (RSV, Flu A&B, Covid) Anterior Nasal Swab     Status: None   Collection Time: 01/01/23 10:54 AM   Specimen: Anterior Nasal Swab  Result Value Ref Range Status   SARS Coronavirus 2 by RT PCR NEGATIVE NEGATIVE Final   Influenza A by PCR NEGATIVE NEGATIVE Final   Influenza B by PCR NEGATIVE NEGATIVE Final    Comment: (NOTE) The Xpert Xpress SARS-CoV-2/FLU/RSV plus assay is intended as an aid in the diagnosis of influenza from Nasopharyngeal swab specimens and should not be used as a sole basis for treatment. Nasal washings and aspirates are unacceptable for Xpert Xpress SARS-CoV-2/FLU/RSV testing.  Fact Sheet for  Patients: EntrepreneurPulse.com.au  Fact Sheet for Healthcare Providers: IncredibleEmployment.be  This test is not yet approved or cleared by the [place] FDA and has been authorized for detection and/or diagnosis of SARS-CoV-2 by FDA under  an Emergency Use Authorization (EUA). This EUA will remain in effect (meaning this test can be used) for the duration of the COVID-19 declaration under Section 564(b)(1) of the Act, 21 U.S.C. section 360bbb-3(b)(1), unless the authorization is terminated or revoked.     Resp Syncytial Virus by PCR NEGATIVE NEGATIVE Final    Comment: (NOTE) Fact Sheet for Patients: EntrepreneurPulse.com.au  Fact Sheet for Healthcare Providers: IncredibleEmployment.be  This test is not yet approved or cleared by the [place] FDA and has been authorized for detection and/or diagnosis of SARS-CoV-2 by FDA under an Emergency Use Authorization (EUA). This EUA will remain in effect (meaning this test can be used) for the duration of the COVID-19 declaration under Section 564(b)(1) of the Act, 21 U.S.C. section 360bbb-3(b)(1), unless the authorization is terminated or revoked.  Performed at [facility], [place] [address], [place], [place] [address]   Culture, blood (Routine X 2) w Reflex to ID Panel     Status: None (Preliminary result)   Collection Time: 01/01/23  4:43 PM   Specimen: BLOOD  Result Value Ref Range Status   Specimen Description BLOOD RIGHT ANTECUBITAL  Final   Special Requests   Final    BOTTLES DRAWN AEROBIC AND ANAEROBIC Blood Culture adequate volume   Culture   Final    NO GROWTH < 12 HOURS Performed at [facility], [place] [address], [place], [place] [address]    Report Status PENDING  Incomplete  Culture, blood (Routine X 2) w Reflex to ID Panel     Status: None (Preliminary result)   Collection Time: 01/01/23  8:55 PM   Specimen: BLOOD  Result  Value Ref Range Status   Specimen Description BLOOD LEFT ANTECUBITAL  Final   Special Requests Blood Culture adequate volume  Final   Culture   Final    NO GROWTH < 12 HOURS Performed at [facility], [place] [address], [place], [place] [address]    Report Status PENDING  Incomplete     Radiology Studies: DG Chest 1 View  Result Date: 01/01/2023 CLINICAL DATA:  Shortness of breath and trouble breathing. EXAM: CHEST  1 VIEW COMPARISON:  06/29/2018 FINDINGS: The cardio pericardial silhouette is enlarged. Bilateral parahilar airspace disease, right greater than left, is new in the interval. Possible trace bilateral pleural effusions. The visualized bony structures of the thorax are unremarkable. IMPRESSION: Bilateral parahilar airspace disease, right greater than left, is new in the interval. Imaging features compatible with asymmetric pulmonary edema or diffuse infection. Electronically Signed   By: Misty Stanley M.D.   On: 01/01/2023 11:32    Scheduled Meds:  amantadine  100 mg Oral QHS   aspirin EC  81 mg Oral Daily   clotrimazole   Topical BID   cloZAPine  200 mg Oral BID   enoxaparin (LOVENOX) injection  40 mg Subcutaneous Q24H   furosemide  20 mg Intravenous BID   guaiFENesin  600 mg Oral BID   levalbuterol  0.63 mg Nebulization QID   linaclotide  290 mcg Oral QAC breakfast   melatonin  10 mg Oral QHS   metoCLOPramide  5 mg Oral BID   mirabegron ER  50 mg Oral Daily   nicotine  14 mg Transdermal Daily   pantoprazole  40 mg Oral QPM   PARoxetine  40 mg Oral QPM   predniSONE  40 mg Oral Q breakfast   senna-docusate  1 tablet Oral BID   sodium chloride flush  3  mL Intravenous Q12H   tamsulosin  0.4 mg Oral Daily   zolpidem  10 mg Oral QHS   Continuous Infusions:  cefTRIAXone (ROCEPHIN)  IV     doxycycline (VIBRAMYCIN) IV       LOS: 1 day   Darliss Cheney, MD [facility]  01/02/2023, 10:18 AM   *Please note that this is a verbal dictation therefore any spelling  or grammatical errors are due to the "[employer] [employer]" system interpretation.  Please page via [place] and do not message via secure chat for urgent patient care matters. Secure chat can be used for non urgent patient care matters.  How to contact the [facility] Attending or Consulting provider [place] or covering provider during after hours [place], for this patient?  Check the care team in [facility] and look for a) attending/consulting [facility] provider listed and b) the [facility] team listed. Page or secure chat [phone]. Log into [email or web address] and use [place]'s universal password to access. If you do not have the password, please contact the hospital operator. Locate the [facility] provider you are looking for under [facility] and page to a number that you can be directly reached. If you still have difficulty reaching the provider, please page the [facility] (Director on Call) for the Hospitalists listed on amion for assistance.

## 2023-01-02 NOTE — Progress Notes (Signed)
Pt found on at this time for break from NIV.  RT will continue to monitor for need of NIV.

## 2023-01-02 NOTE — ED Notes (Signed)
Sitter at bedside with pt. Pt is currently resting in bed

## 2023-01-02 NOTE — Progress Notes (Signed)
*  PRELIMINARY RESULTS* Echocardiogram 2D Echocardiogram has been performed.  Jordan Kennedy 01/02/2023, 2:19 PM

## 2023-01-02 NOTE — ED Notes (Signed)
ED TO INPATIENT HANDOFF REPORT  ED Nurse Name and Phone #: Sheryle Spray, RN [phone]  S Name/Age/Gender Jordan Kennedy 67 y.o. male Room/Bed: 016C/016C  Code Status   Code Status: Full Code  Home/SNF/Other Home Patient oriented to: self, place, time, and situation Is this baseline? Yes   Triage Complete: Triage complete  Chief Complaint CAP (community acquired pneumonia) due to Mycoplasma pneumoniae [J15.7] Acute respiratory failure with hypoxia ([place]) [J96.01]  Triage Note Pt arrives via EMS after being seen at [facility]. O2 was in the mid 80s. They gave one duoneb and placed him on 3Lnc. Pt reports sob and trouble breathing today, reports cough since last week. Pt is AxO, he does have his caregiver with him.    Allergies No Known Allergies  Level of Care/Admitting Diagnosis ED Disposition     ED Disposition  Admit   Condition  --   [place]: [place] [[id number]]  Level of Care: Progressive [102]  Admit to Progressive based on following criteria: RESPIRATORY PROBLEMS hypoxemic/hypercapnic respiratory failure that is responsive to NIPPV (BiPAP) or High Flow Nasal Cannula (6-80 lpm). Frequent assessment/intervention, no > Q2 hrs < Q4 hrs, to maintain oxygenation and pulmonary hygiene.  May admit patient to [facility] or [facility] if equivalent level of care is available:: No  Covid Evaluation: Asymptomatic - no recent exposure (last 10 days) testing not required  Diagnosis: Acute respiratory failure with hypoxia [facility]) [id number]  Admitting Physician: Norval Morton [id number]  Attending Physician: Norval Morton [id number]  Certification:: I certify this patient will need inpatient services for at least 2 midnights          B Medical/Surgery History Past Medical History:  Diagnosis Date   AKI (acute kidney injury) ([place]) 06/29/2018   Anemia    Anemia, iron deficiency 09/18/2015   Bowel obstruction (HCC)    BPH (benign prostatic hyperplasia)     Chronic constipation    History of ileus    Ileus ([place]) 03/07/2017   Mental retardation    Partial bowel obstruction ([place])    Schizophrenia ([place])    Monarche every three months;    SIRS (systemic inflammatory response syndrome) ([place]) 06/29/2018   Small bowel obstruction ([place]) 06/29/2018   History reviewed. No pertinent surgical history.   A IV Location/Drains/Wounds Patient Lines/Drains/Airways Status     Active Line/Drains/Airways     Name Placement date Placement time Site Days   Peripheral IV 01/01/23 20 G Left;Posterior Hand 01/01/23  1447  Hand  1   Peripheral IV 01/01/23 18 G Right Antecubital 01/01/23  1643  Antecubital  1   External Urinary Catheter 01/01/23  2001  --  1            Intake/Output Last 24 hours  Intake/Output Summary (Last 24 hours) at 01/02/2023 1437 Last data filed at 01/02/2023 1237 Gross per 24 hour  Intake 600 ml  Output 2000 ml  Net -1400 ml    Labs/Imaging Results for orders placed or performed during the hospital encounter of 01/01/23 (from the past 48 hour(s))  Resp panel by RT-PCR (RSV, Flu A&B, Covid) Anterior Nasal Swab     Status: None   Collection Time: 01/01/23 10:54 AM   Specimen: Anterior Nasal Swab  Result Value Ref Range   SARS Coronavirus 2 by RT PCR NEGATIVE NEGATIVE   Influenza A by PCR NEGATIVE NEGATIVE   Influenza B by PCR NEGATIVE NEGATIVE    Comment: (NOTE) The Xpert Xpress SARS-CoV-2/FLU/RSV plus  assay is intended as an aid in the diagnosis of influenza from Nasopharyngeal swab specimens and should not be used as a sole basis for treatment. Nasal washings and aspirates are unacceptable for Xpert Xpress SARS-CoV-2/FLU/RSV testing.  Fact Sheet for Patients: EntrepreneurPulse.com.au  Fact Sheet for Healthcare Providers: IncredibleEmployment.be  This test is not yet approved or cleared by the [place] FDA and has been authorized for detection and/or diagnosis of SARS-CoV-2  by FDA under an Emergency Use Authorization (EUA). This EUA will remain in effect (meaning this test can be used) for the duration of the COVID-19 declaration under Section 564(b)(1) of the Act, 21 U.S.C. section 360bbb-3(b)(1), unless the authorization is terminated or revoked.     Resp Syncytial Virus by PCR NEGATIVE NEGATIVE    Comment: (NOTE) Fact Sheet for Patients: EntrepreneurPulse.com.au  Fact Sheet for Healthcare Providers: IncredibleEmployment.be  This test is not yet approved or cleared by the [place] FDA and has been authorized for detection and/or diagnosis of SARS-CoV-2 by FDA under an Emergency Use Authorization (EUA). This EUA will remain in effect (meaning this test can be used) for the duration of the COVID-19 declaration under Section 564(b)(1) of the Act, 21 U.S.C. section 360bbb-3(b)(1), unless the authorization is terminated or revoked.  Performed at [facility], [place] [address] [place], [place] [address]   CBC with Differential     Status: Abnormal   Collection Time: 01/01/23 10:55 AM  Result Value Ref Range   WBC 20.5 (H) 4.0 - 10.5 K/uL   RBC 3.69 (L) 4.22 - 5.81 MIL/uL   Hemoglobin 11.7 (L) 13.0 - 17.0 g/dL   HCT 36.0 (L) 39.0 - 52.0 %   MCV 97.6 80.0 - 100.0 fL   MCH 31.7 26.0 - 34.0 pg   MCHC 32.5 30.0 - 36.0 g/dL   RDW 16.6 (H) 11.5 - 15.5 %   Platelets 490 (H) 150 - 400 K/uL   nRBC 0.0 0.0 - 0.2 %   Neutrophils Relative % 85 %   Neutro Abs 17.5 (H) 1.7 - 7.7 K/uL   Lymphocytes Relative 5 %   Lymphs Abs 0.9 0.7 - 4.0 K/uL   Monocytes Relative 9 %   Monocytes Absolute 1.8 (H) 0.1 - 1.0 K/uL   Eosinophils Relative 0 %   Eosinophils Absolute 0.0 0.0 - 0.5 K/uL   Basophils Relative 0 %   Basophils Absolute 0.1 0.0 - 0.1 K/uL   Immature Granulocytes 1 %   Abs Immature Granulocytes 0.11 (H) 0.00 - 0.07 K/uL    Comment: Performed at [facility] [address], [place], [place] [address]   Comprehensive metabolic panel     Status: Abnormal   Collection Time: 01/01/23 10:55 AM  Result Value Ref Range   Sodium 141 135 - 145 mmol/L   Potassium 3.7 3.5 - 5.1 mmol/L   Chloride 110 98 - 111 mmol/L   CO2 21 (L) 22 - 32 mmol/L   Glucose, Bld 156 (H) 70 - 99 mg/dL    Comment: Glucose reference range applies only to samples taken after fasting for at least 8 hours.   BUN 13 8 - 23 mg/dL   Creatinine, Ser 1.02 0.61 - 1.24 mg/dL   Calcium 8.3 (L) 8.9 - 10.3 mg/dL   Total Protein 5.7 (L) 6.5 - 8.1 g/dL   Albumin 2.7 (L) 3.5 - 5.0 g/dL   AST 59 (H) 15 - 41 U/L   ALT 137 (H) 0 - 44 U/L  Alkaline Phosphatase 198 (H) 38 - 126 U/L   Total Bilirubin 0.5 0.3 - 1.2 mg/dL   GFR, Estimated >60 >60 mL/min    Comment: (NOTE) Calculated using the CKD-EPI Creatinine Equation (2021)    Anion gap 10 5 - 15    Comment: Performed at [facility] [address] [place], [place] [address]  Respiratory (~20 pathogens) panel by PCR     Status: None   Collection Time: 01/01/23 11:21 AM   Specimen: Nasopharyngeal Swab; Respiratory  Result Value Ref Range   Adenovirus NOT DETECTED NOT DETECTED   Coronavirus 229E NOT DETECTED NOT DETECTED    Comment: (NOTE) The Coronavirus on the Respiratory Panel, DOES NOT test for the novel  Coronavirus (2019 nCoV)    Coronavirus HKU1 NOT DETECTED NOT DETECTED   Coronavirus NL63 NOT DETECTED NOT DETECTED   Coronavirus OC43 NOT DETECTED NOT DETECTED   Metapneumovirus NOT DETECTED NOT DETECTED   Rhinovirus / Enterovirus NOT DETECTED NOT DETECTED   Influenza A NOT DETECTED NOT DETECTED   Influenza B NOT DETECTED NOT DETECTED   Parainfluenza Virus 1 NOT DETECTED NOT DETECTED   Parainfluenza Virus 2 NOT DETECTED NOT DETECTED   Parainfluenza Virus 3 NOT DETECTED NOT DETECTED   Parainfluenza Virus 4 NOT DETECTED NOT DETECTED   Respiratory Syncytial Virus NOT DETECTED NOT DETECTED   Bordetella pertussis NOT DETECTED NOT DETECTED   Bordetella Parapertussis  NOT DETECTED NOT DETECTED   Chlamydophila pneumoniae NOT DETECTED NOT DETECTED   Mycoplasma pneumoniae NOT DETECTED NOT DETECTED    Comment: Performed at [facility], [place] [address] [place], [place] [address]  Strep pneumoniae urinary antigen     Status: None   Collection Time: 01/01/23  2:53 PM  Result Value Ref Range   Strep Pneumo Urinary Antigen NEGATIVE NEGATIVE    Comment:        Infection due to S. pneumoniae cannot be absolutely ruled out since the antigen present may be below the detection limit of the test. Performed at [facility], [place] [address] [place], [place] [address]   HIV Antibody (routine testing w rflx)     Status: None   Collection Time: 01/01/23  4:20 PM  Result Value Ref Range   HIV Screen 4th Generation wRfx Non Reactive Non Reactive    Comment: Performed at [facility], [place] [address] [place], [place] [address]  Procalcitonin     Status: None   Collection Time: 01/01/23  4:20 PM  Result Value Ref Range   Procalcitonin 0.37 ng/mL    Comment:        Interpretation: PCT (Procalcitonin) <= 0.5 ng/mL: Systemic infection (sepsis) is not likely. Local bacterial infection is possible. (NOTE)       Sepsis PCT Algorithm           Lower Respiratory Tract                                      Infection PCT Algorithm    ----------------------------     ----------------------------         PCT < 0.25 ng/mL                PCT < 0.10 ng/mL          Strongly encourage             Strongly discourage   discontinuation  of antibiotics    initiation of antibiotics    ----------------------------     -----------------------------       PCT 0.25 - 0.50 ng/mL            PCT 0.10 - 0.25 ng/mL               OR       >80% decrease in PCT            Discourage initiation of                                            antibiotics      Encourage discontinuation           of antibiotics    ----------------------------     -----------------------------          PCT >= 0.50 ng/mL              PCT 0.26 - 0.50 ng/mL               AND        <80% decrease in PCT             Encourage initiation of                                             antibiotics       Encourage continuation           of antibiotics    ----------------------------     -----------------------------        PCT >= 0.50 ng/mL                  PCT > 0.50 ng/mL               AND         increase in PCT                  Strongly encourage                                      initiation of antibiotics    Strongly encourage escalation           of antibiotics                                     -----------------------------                                           PCT <= 0.25 ng/mL                                                 OR                                        >  80% decrease in PCT                                      Discontinue / Do not initiate                                             antibiotics  Performed at [facility], [place] [address] [place], [place] [address]   Hepatitis panel, acute     Status: None   Collection Time: 01/01/23  4:20 PM  Result Value Ref Range   Hepatitis B Surface Ag NON REACTIVE NON REACTIVE   HCV Ab NON REACTIVE NON REACTIVE    Comment: (NOTE) Nonreactive HCV antibody screen is consistent with no HCV infections,  unless recent infection is suspected or other evidence exists to indicate HCV infection.     Hep A IgM NON REACTIVE NON REACTIVE   Hep B C IgM NON REACTIVE NON REACTIVE    Comment: Performed at [facility], [place] [address] [place], [place] [address]  Brain natriuretic peptide     Status: Abnormal   Collection Time: 01/01/23  4:20 PM  Result Value Ref Range   B Natriuretic Peptide 985.0 (H) 0.0 - 100.0 pg/mL    Comment: Performed at [facility] [address] [place], [place] [address]  CBG monitoring, ED     Status: Abnormal   Collection Time: 01/01/23  4:22 PM  Result Value Ref Range    Glucose-Capillary 134 (H) 70 - 99 mg/dL    Comment: Glucose reference range applies only to samples taken after fasting for at least 8 hours.  Lactic acid, plasma     Status: None   Collection Time: 01/01/23  4:30 PM  Result Value Ref Range   Lactic Acid, Venous 1.4 0.5 - 1.9 mmol/L    Comment: Performed at [facility] [address], [place], [place] [address]  Culture, blood (Routine X 2) w Reflex to ID Panel     Status: None (Preliminary result)   Collection Time: 01/01/23  4:43 PM   Specimen: BLOOD  Result Value Ref Range   Specimen Description BLOOD RIGHT ANTECUBITAL    Special Requests      BOTTLES DRAWN AEROBIC AND ANAEROBIC Blood Culture adequate volume   Culture      NO GROWTH < 12 HOURS Performed at [facility], [place] [address] [place],  [address]    Report Status PENDING   I-Stat arterial blood gas, ED     Status: Abnormal   Collection Time: 01/01/23  5:16 PM  Result Value Ref Range   pH, Arterial 7.414 7.35 - 7.45   pCO2 arterial 36.9 32 - 48 mmHg   pO2, Arterial 58 (L) 83 - 108 mmHg   Bicarbonate 23.7 20.0 - 28.0 mmol/L   TCO2 25 22 - 32 mmol/L   O2 Saturation 90 %   Acid-base deficit 1.0 0.0 - 2.0 mmol/L   Sodium 143 135 - 145 mmol/L   Potassium 3.8 3.5 - 5.1 mmol/L   Calcium, Ion 1.26 1.15 - 1.40 mmol/L   HCT 33.0 (L) 39.0 - 52.0 %   Hemoglobin 11.2 (L) 13.0 - 17.0 g/dL   Patient temperature 98.6 F    Collection site RADIAL,  ALLEN'S TEST ACCEPTABLE    Drawn by RT    Sample type ARTERIAL   Culture, blood (Routine X 2) w Reflex to ID Panel     Status: None (Preliminary result)   Collection Time: 01/01/23  8:55 PM   Specimen: BLOOD  Result Value Ref Range   Specimen Description BLOOD LEFT ANTECUBITAL    Special Requests Blood Culture adequate volume    Culture      NO GROWTH < 12 HOURS Performed at [facility] [address] [place], [place] [address]    Report Status PENDING   CBC     Status: Abnormal   Collection Time:  01/02/23  4:34 AM  Result Value Ref Range   WBC 19.3 (H) 4.0 - 10.5 K/uL   RBC 3.77 (L) 4.22 - 5.81 MIL/uL   Hemoglobin 11.7 (L) 13.0 - 17.0 g/dL   HCT 35.9 (L) 39.0 - 52.0 %   MCV 95.2 80.0 - 100.0 fL   MCH 31.0 26.0 - 34.0 pg   MCHC 32.6 30.0 - 36.0 g/dL   RDW 16.5 (H) 11.5 - 15.5 %   Platelets 472 (H) 150 - 400 K/uL   nRBC 0.0 0.0 - 0.2 %    Comment: Performed at [facility] [address], [place], [place] [address]  Comprehensive metabolic panel     Status: Abnormal   Collection Time: 01/02/23  4:34 AM  Result Value Ref Range   Sodium 139 135 - 145 mmol/L   Potassium 4.2 3.5 - 5.1 mmol/L   Chloride 104 98 - 111 mmol/L   CO2 23 22 - 32 mmol/L   Glucose, Bld 165 (H) 70 - 99 mg/dL    Comment: Glucose reference range applies only to samples taken after fasting for at least 8 hours.   BUN 13 8 - 23 mg/dL   Creatinine, Ser 1.01 0.61 - 1.24 mg/dL   Calcium 8.6 (L) 8.9 - 10.3 mg/dL   Total Protein 5.9 (L) 6.5 - 8.1 g/dL   Albumin 2.4 (L) 3.5 - 5.0 g/dL   AST 49 (H) 15 - 41 U/L   ALT 131 (H) 0 - 44 U/L   Alkaline Phosphatase 189 (H) 38 - 126 U/L   Total Bilirubin 0.5 0.3 - 1.2 mg/dL   GFR, Estimated >60 >60 mL/min    Comment: (NOTE) Calculated using the CKD-EPI Creatinine Equation (2021)    Anion gap 12 5 - 15    Comment: Performed at [facility], [place] [address] [place], [place] [address]  Magnesium     Status: None   Collection Time: 01/02/23  4:34 AM  Result Value Ref Range   Magnesium 2.3 1.7 - 2.4 mg/dL    Comment: Performed at [facility] [address] [place], [place] [address]   DG Chest 1 View  Result Date: 01/01/2023 CLINICAL DATA:  Shortness of breath and trouble breathing. EXAM: CHEST  1 VIEW COMPARISON:  06/29/2018 FINDINGS: The cardio pericardial silhouette is enlarged. Bilateral parahilar airspace disease, right greater than left, is new in the interval. Possible trace bilateral pleural effusions. The visualized bony structures of the  thorax are unremarkable. IMPRESSION: Bilateral parahilar airspace disease, right greater than left, is new in the interval. Imaging features compatible with asymmetric pulmonary edema or diffuse infection. Electronically Signed   By: Misty Stanley M.D.   On: 01/01/2023 11:32    Pending Labs [employer] (From admission, onward)     Start  Ordered   01/04/23 0500  Procalcitonin  Once,   R       References:    Procalcitonin Lower Respiratory Tract Infection AND Sepsis Procalcitonin Algorithm   01/02/23 1026   01/03/23 0500  CBC with Differential/Platelet  Daily,   R      01/02/23 1026   01/03/23 0500  Comprehensive metabolic panel  Daily,   R      01/02/23 1026   01/03/23 0500  Magnesium  Tomorrow morning,   R        01/02/23 1026   01/01/23 1709  MRSA Next Gen by PCR, Nasal  Once,   R        01/01/23 1708   01/01/23 1702  Blood gas, arterial  ONCE - STAT,   STAT        01/01/23 1701   01/01/23 1453  Legionella Pneumophila Serogp 1 Ur Ag  (COPD / Pneumonia / Cellulitis / Lower Extremity Wound)  Once,   R        01/01/23 1458            Vitals/Pain Today's Vitals   01/02/23 1133 01/02/23 1235 01/02/23 1300 01/02/23 1400  BP:    (!) 113/96  Pulse:    100  Resp:    (!) 27  Temp:  98.8 F (37.1 C)    TempSrc:  Axillary    SpO2: 94%  94% 94%  Weight:      Height:      PainSc:        Isolation Precautions Droplet precaution  Medications Medications  enoxaparin (LOVENOX) injection 40 mg (40 mg Subcutaneous Given 01/01/23 1754)  sodium chloride flush (NS) 0.9 % injection 3 mL (3 mLs Intravenous Given 01/02/23 1137)  cefTRIAXone (ROCEPHIN) 2 g in sodium chloride 0.9 % 100 mL IVPB (0 g Intravenous Stopped 01/02/23 1206)  acetaminophen (TYLENOL) tablet 650 mg (650 mg Oral Given 01/01/23 2146)    Or  acetaminophen (TYLENOL) suppository 650 mg ( Rectal See Alternative 01/01/23 2146)  albuterol (PROVENTIL) (2.5 MG/3ML) 0.083% nebulizer solution 2.5 mg (2.5 mg Nebulization Given  01/01/23 1546)  guaiFENesin (MUCINEX) 12 hr tablet 600 mg (600 mg Oral Given 01/02/23 1247)  trimethobenzamide (TIGAN) injection 200 mg (has no administration in time range)  doxycycline (VIBRAMYCIN) 100 mg in sodium chloride 0.9 % 250 mL IVPB (has no administration in time range)  nicotine (NICODERM CQ - dosed in mg/24 hours) patch 14 mg (14 mg Transdermal Patch Applied 01/02/23 1252)  senna-docusate (Senokot-S) tablet 1 tablet (1 tablet Oral Given 01/02/23 1247)  amantadine (SYMMETREL) capsule 100 mg (100 mg Oral Not Given 01/01/23 2347)  mirabegron ER (MYRBETRIQ) tablet 50 mg (50 mg Oral Given 01/02/23 1247)  zolpidem (AMBIEN) tablet 10 mg (10 mg Oral Given 01/01/23 2146)  aspirin EC tablet 81 mg (81 mg Oral Given 01/02/23 1247)  tamsulosin (FLOMAX) capsule 0.4 mg (0.4 mg Oral Given 01/02/23 1247)  clotrimazole (LOTRIMIN) 1 % cream (0 Applications Topical Hold 01/01/23 2347)  metoCLOPramide (REGLAN) tablet 5 mg (5 mg Oral Given 01/02/23 1247)  pantoprazole (PROTONIX) EC tablet 40 mg (40 mg Oral Not Given 01/01/23 1821)  PARoxetine (PAXIL) tablet 40 mg (40 mg Oral Not Given 01/01/23 1822)  linaclotide (LINZESS) capsule 290 mcg (290 mcg Oral Given 01/02/23 0813)  melatonin tablet 10 mg (10 mg Oral Not Given 01/01/23 2347)  cloZAPine (CLOZARIL) tablet 200 mg (200 mg Oral Given 01/02/23 1248)  levalbuterol (XOPENEX) nebulizer solution 0.63 mg (0.63 mg Nebulization  Given 01/02/23 1133)  predniSONE (DELTASONE) tablet 40 mg (40 mg Oral Given 01/02/23 0813)  furosemide (LASIX) injection 40 mg (has no administration in time range)  perflutren lipid microspheres (DEFINITY) IV suspension (5 mLs Intravenous Given 01/02/23 1400)  cefTRIAXone (ROCEPHIN) 2 g in sodium chloride 0.9 % 100 mL IVPB (0 g Intravenous Stopped 01/01/23 1350)  azithromycin (ZITHROMAX) 500 mg in sodium chloride 0.9 % 250 mL IVPB (0 mg Intravenous Stopped 01/01/23 1523)  LORazepam (ATIVAN) tablet 1 mg (1 mg Oral Given 01/01/23 1500)  calcium gluconate  2 g/ 100 mL sodium chloride IVPB (0 mg Intravenous Stopped 01/01/23 1757)  furosemide (LASIX) injection 20 mg (20 mg Intravenous Given 01/01/23 1716)  magnesium sulfate IVPB 2 g 50 mL (0 g Intravenous Stopped 01/01/23 1919)  methylPREDNISolone sodium succinate (SOLU-MEDROL) 125 mg/2 mL injection 125 mg (125 mg Intravenous Given 01/01/23 1754)  furosemide (LASIX) injection 20 mg (20 mg Intravenous Given 01/01/23 2347)    Mobility walks     Focused Assessments    R Recommendations: See Admitting Provider Note  Report given to:   Additional Notes: Patient comes from group home, friend in chart, [name], is his caregiver. Patient no longer requires bipap but remains on  at 4L. Slightly tachypnic but no distress, swallows pills whole but one at a time, does need some help feeding.

## 2023-01-02 NOTE — ED Notes (Signed)
RN setup pt breakfast tray. Pt eating food at this time.

## 2023-01-03 ENCOUNTER — Inpatient Hospital Stay (HOSPITAL_COMMUNITY): Payer: Medicare Other

## 2023-01-03 DIAGNOSIS — A419 Sepsis, unspecified organism: Secondary | ICD-10-CM | POA: Diagnosis not present

## 2023-01-03 DIAGNOSIS — R6520 Severe sepsis without septic shock: Secondary | ICD-10-CM

## 2023-01-03 DIAGNOSIS — I5021 Acute systolic (congestive) heart failure: Secondary | ICD-10-CM | POA: Insufficient documentation

## 2023-01-03 DIAGNOSIS — I5022 Chronic systolic (congestive) heart failure: Secondary | ICD-10-CM | POA: Insufficient documentation

## 2023-01-03 LAB — COMPREHENSIVE METABOLIC PANEL
ALT: 143 U/L — ABNORMAL HIGH (ref 0–44)
AST: 91 U/L — ABNORMAL HIGH (ref 15–41)
Albumin: 2.2 g/dL — ABNORMAL LOW (ref 3.5–5.0)
Alkaline Phosphatase: 172 U/L — ABNORMAL HIGH (ref 38–126)
Anion gap: 10 (ref 5–15)
BUN: 23 mg/dL (ref 8–23)
CO2: 23 mmol/L (ref 22–32)
Calcium: 8.5 mg/dL — ABNORMAL LOW (ref 8.9–10.3)
Chloride: 104 mmol/L (ref 98–111)
Creatinine, Ser: 1.1 mg/dL (ref 0.61–1.24)
GFR, Estimated: >60 mL/min (ref >60)
Glucose, Bld: 169 mg/dL — ABNORMAL HIGH (ref 70–99)
Potassium: 4.1 mmol/L (ref 3.5–5.1)
Sodium: 137 mmol/L (ref 135–145)
Total Bilirubin: 0.7 mg/dL (ref 0.3–1.2)
Total Protein: 5.6 g/dL — ABNORMAL LOW (ref 6.5–8.1)

## 2023-01-03 LAB — CBC WITH DIFFERENTIAL/PLATELET
Abs Immature Granulocytes: 0.24 10*3/uL — ABNORMAL HIGH (ref 0.00–0.07)
Basophils Absolute: 0 10*3/uL (ref 0.0–0.1)
Basophils Relative: 0 %
Eosinophils Absolute: 0 10*3/uL (ref 0.0–0.5)
Eosinophils Relative: 0 %
HCT: 38 % — ABNORMAL LOW (ref 39.0–52.0)
Hemoglobin: 12.5 g/dL — ABNORMAL LOW (ref 13.0–17.0)
Immature Granulocytes: 1 %
Lymphocytes Relative: 7 %
Lymphs Abs: 1.6 10*3/uL (ref 0.7–4.0)
MCH: 30.9 pg (ref 26.0–34.0)
MCHC: 32.9 g/dL (ref 30.0–36.0)
MCV: 94.1 fL (ref 80.0–100.0)
Monocytes Absolute: 2.1 10*3/uL — ABNORMAL HIGH (ref 0.1–1.0)
Monocytes Relative: 10 %
Neutro Abs: 17.2 10*3/uL — ABNORMAL HIGH (ref 1.7–7.7)
Neutrophils Relative %: 82 %
Platelets: 409 10*3/uL — ABNORMAL HIGH (ref 150–400)
RBC: 4.04 MIL/uL — ABNORMAL LOW (ref 4.22–5.81)
RDW: 16 % — ABNORMAL HIGH (ref 11.5–15.5)
WBC: 21.2 10*3/uL — ABNORMAL HIGH (ref 4.0–10.5)
nRBC: 0 % (ref 0.0–0.2)

## 2023-01-03 LAB — LEGIONELLA PNEUMOPHILA SEROGP 1 UR AG: L. pneumophila Serogp 1 Ur Ag: NEGATIVE

## 2023-01-03 LAB — TROPONIN I (HIGH SENSITIVITY)
Troponin I (High Sensitivity): 30 ng/L — ABNORMAL HIGH (ref <18)
Troponin I (High Sensitivity): 28 ng/L — ABNORMAL HIGH (ref <18)
Troponin I (High Sensitivity): 26 ng/L — ABNORMAL HIGH (ref <18)

## 2023-01-03 LAB — MAGNESIUM: Magnesium: 2.1 mg/dL (ref 1.7–2.4)

## 2023-01-03 NOTE — Progress Notes (Signed)
RT attempted to place pt on BIPAP for the night. Pt started coughing and then stated he wanted to vomit. RT removed mask. No respiratory distress noted. No BIPAP at this time. RT will monitor.

## 2023-01-03 NOTE — Consult Note (Addendum)
Cardiology Consultation   Patient ID: Adaryll Banta MRN: [id number]; DOB: [birth year]  Admit date: 01/01/2023 Date of Consult: 01/03/2023  PCP:  Haydee Salter, MD   [place] Providers Cardiologist:  New to Dr. Harriet Masson  Patient Profile:   Jordan Kennedy is a 67 y.o. male with a hx of cognitive impairment, schizophrenia residing in group home, tardive dyskinesia, constipation, SBO, anemia, tobacco abuse who is being seen 01/03/2023 for the evaluation of LV dysfunction at the request of Dr. Doristine Bosworth.  History of Present Illness:   Jordan Kennedy carries the above hx but no past cardiac hx. Per chart review, he is a ward of the state of [place] as his legal guardian.  Per chart review, he was admitted 01/01/23 with 1.5 weeks of nonproductive cough, nasal congestion, and weakness. He'd tested negative for Covid on 3/5. However, he developed worsening SOB 2 days prior to admission so presented to ER where he was found to be febrile at 101.4% with tachycardia (sinus), tachypnea, and hypoxia down to 84% requiring NRB. Chest x-ray noted bilateral perihilar airspace disease right greater than left with new interval opacities concerning for pulmonary edema or diffuse infection. He was admitted for subsequent management of CAP as well as suspected CHF with BNP 985. 2D echo yesterday showed EF 25-30%, +WMA (nterior septum, mid inferoseptal segment, and basal inferoseptal segment are akinetic, apical  septal segment hypokinetic), mild asymmetric LVH of the inferolateral segment, mildly reduced RVSF, moderate LAE, moderate-severe MR with splay artifact suggesting this may be underestimated. Hospitalization also notable for wheezing requiring steroids, prolonged QT interval while tachycardic, elevated LFTs with negative viral hep panel. EKG shows sinus tachycardia, NSIVCD with LBBB appearance (new from 2019), QTc 566m but difficult to discern with tachycardia, baseline  wander, occasional PVCs. Labs otherwise notable for hsTroponin 30->28, leukocytosis, elevated platelets in 400's, procalcitonin 0.37, Covid/RVP negative.  HR remains 90s-low 100s, SBP 1teens primarily. From HF standpoint, was started on IV Lasix '40mg'$  BID, -3L thus far, weights not available.  The patient is overall a very poor historian in the context of his intellectual disability. States he's been smoking "for 1 year." Doesn't really recall why he came to the hospital, perseverates on relaying his dinner order, every time we try to discuss any medical issues, cuts the conversation off, "thanks for helping me! Let me know when you have my dinner order." He says he's at "emergency care" and only knows the date after looking at the calendar, unable to tell the season. Denies any chest pain or swelling.   Past Medical History:  Diagnosis Date   AKI (acute kidney injury) ([facility] 06/29/2018   Anemia    Anemia, iron deficiency 09/18/2015   Bowel obstruction (HCC)    BPH (benign prostatic hyperplasia)    Chronic constipation    History of ileus    [place]leus [place] 03/07/2017   Mental retardation    [place]    Schizophrenia ([place]    Monarche every three months;    SIRS (systemic inflammatory response syndrome) ([facility] 06/29/2018   Small bowel obstruction ([place] 06/29/2018    History reviewed. No pertinent surgical history.   Home Medications:  Prior to Admission medications   Medication Sig Start Date End Date Taking? Authorizing Provider  amantadine (SYMMETREL) 100 MG capsule Take 100 mg by mouth at bedtime.    Yes [provider]  aspirin EC (GNP ASPIRIN) 81 MG tablet Take 1 tablet (81 mg total) by  mouth daily. 09/18/22  Yes Haydee Salter, MD  clotrimazole (LOTRIMIN) 1 % cream Apply topically 2 (two) times daily. 05/18/22  Yes Haydee Salter, MD  cloZAPine (CLOZARIL) 100 MG tablet Take 2 tablets (200 mg total) by mouth 2 (two) times daily. 03/12/17  Yes Eugenie Filler,  MD  LINZESS 290 MCG CAPS capsule TAKE ONE [place] Patient taking differently: Take 290 mcg by mouth daily. 07/20/22  Yes Haydee Salter, MD  Melatonin 10 MG SUBL Place 10 mg under the tongue at bedtime.   Yes [provider]  metoCLOPramide (REGLAN) 5 MG tablet TAKE ONE TABLET TWICE DAILY Patient taking differently: Take 5 mg by mouth in the morning and at bedtime. 03/09/22  Yes Haydee Salter, MD  MYRBETRIQ 50 MG TB24 tablet Take 50 mg by mouth at bedtime. 01/06/21  Yes [provider]  omeprazole (PRILOSEC) 40 MG capsule TAKE ONE CAPSULE EVERY EVENING Patient taking differently: Take 40 mg by mouth at bedtime. 11/15/22  Yes Haydee Salter, MD  PARoxetine (PAXIL) 40 MG tablet Take 40 mg by mouth every evening.    Yes [provider]  polyethylene glycol powder (GLYCOLAX/MIRALAX) 17 GM/SCOOP powder 17G IN 8OZ OF FLUID OF CHOICE MONDAY AND THURSDAY Patient taking differently: Take 17 g by mouth See admin instructions. 17 g twice weekly on Monday and Thursday. 10/31/22  Yes Haydee Salter, MD  SENNA PLUS 8.6-50 MG tablet TAKE ONE TABLET TWICE DAILY Patient taking differently: Take 1 tablet by mouth 2 (two) times daily. 08/28/22  Yes Haydee Salter, MD  tamsulosin (FLOMAX) 0.4 MG CAPS capsule Take 0.4 mg by mouth at bedtime. 01/10/21  Yes [provider]  zolpidem (AMBIEN) 10 MG tablet Take 10 mg by mouth at bedtime.   Yes [provider]    Inpatient Medications: Scheduled Meds:  amantadine  100 mg Oral QHS   aspirin EC  81 mg Oral Daily   clotrimazole   Topical BID   cloZAPine  200 mg Oral BID   enoxaparin (LOVENOX) injection  40 mg Subcutaneous Q24H   furosemide  40 mg Intravenous BID   guaiFENesin  600 mg Oral BID   levalbuterol  0.63 mg Nebulization QID   linaclotide  290 mcg Oral QAC breakfast   melatonin  10 mg Oral QHS   metoCLOPramide  5 mg Oral BID   mirabegron ER  50 mg Oral Daily   nicotine  14 mg  Transdermal Daily   pantoprazole  40 mg Oral QPM   PARoxetine  40 mg Oral QPM   predniSONE  40 mg Oral Q breakfast   senna-docusate  1 tablet Oral BID   sodium chloride flush  3 mL Intravenous Q12H   tamsulosin  0.4 mg Oral Daily   zolpidem  10 mg Oral QHS   Continuous Infusions:  cefTRIAXone (ROCEPHIN)  IV 2 g (01/03/23 0924)   doxycycline (VIBRAMYCIN) IV 100 mg (01/03/23 1035)   PRN Meds: acetaminophen **OR** acetaminophen, albuterol, trimethobenzamide  Allergies:   No Known Allergies  Social History:   Social History   Socioeconomic History   Marital status: Single    Spouse name: Not on file   Number of children: Not on file   Years of education: Not on file   Highest education level: Not on file  Occupational History   Not on file  Tobacco Use   Smoking status: Every Day    Packs/day: 0.25    Years:  30.00    Additional pack years: 0.00    Total pack years: 7.50    Types: Cigarettes   Smokeless tobacco: Never  Vaping Use   Vaping Use: Never used  Substance and Sexual Activity   Alcohol use: No   Drug use: No   Sexual activity: Never  Other Topics Concern   Not on file  Social History Narrative   Marital status: single      Children: none       Employment: disability due to mental retardation and schizophrenia.      Lives: group home [place]; [facility]; 3 men in group home.        Tobacco: six cigarettes per day; since age 77.      Alcohol:  None      Drugs: none      Exercise:  Walking some.      Seatbelt:  100%   Social Determinants of Health   Financial Resource Strain: Low Risk  (10/16/2022)   Overall Financial Resource Strain (CARDIA)    Difficulty of Paying Living Expenses: Not hard at all  Food Insecurity: No Food Insecurity (10/16/2022)   Hunger Vital Sign    Worried About Running Out of Food in the Last Year: Never true    Ran Out of Food in the Last Year: Never true  Transportation Needs: No Transportation  Needs (10/16/2022)   PRAPARE - Hydrologist (Medical): No    Lack of Transportation (Non-Medical): No  Physical Activity: Inactive (10/16/2022)   Exercise Vital Sign    Days of Exercise per Week: 0 days    Minutes of Exercise per Session: 0 min  Stress: No Stress Concern Present (10/16/2022)   [place]    Feeling of Stress : Not at all  Social Connections: Socially Isolated (10/11/2021)   Social Connection and Isolation Panel [NHANES]    Frequency of Communication with Friends and Family: Twice a week    Frequency of Social Gatherings with Friends and Family: Twice a week    Attends Religious Services: Never    Marine scientist or Organizations: No    Attends Archivist Meetings: Never    Marital Status: Never married  Intimate Partner Violence: Not At Risk (10/11/2021)   Humiliation, Afraid, Rape, and Kick questionnaire    Fear of Current or Ex-Partner: No    Emotionally Abused: No    Physically Abused: No    Sexually Abused: No    Family History:   Family History  Problem Relation Age of Onset   Cancer Mother        unknown type     ROS:  Please see the history of present illness.  All other ROS reviewed and negative.     Physical Exam/Data:   Vitals:   01/03/23 1155 01/03/23 1209 01/03/23 1551 01/03/23 1559  BP: (!) 147/67  116/67   Pulse: (!) 107  (!) 104   Resp: (!) 25  20   Temp:   99.2 F (37.3 C)   TempSrc:   Oral   SpO2: 93% 95% 95% 94%  Weight:      Height:        Intake/Output Summary (Last 24 hours) at 01/03/2023 1621 Last data filed at 01/03/2023 1100 Gross per 24 hour  Intake 831.52 ml  Output 3100 ml  Net -2268.48 ml      01/01/2023  10:48 AM 12/25/2022    3:54 PM 10/16/2022    4:11 PM  Last 3 Weights  Weight (lbs) 204 lb 204 lb 198 lb  Weight (kg) 92.534 kg 92.534 kg 89.812 kg     Body mass index is 26.19 kg/m.  General: Well  developed, well nourished, in no acute distress. Head: Normocephalic, atraumatic, exopthalmos noted, no scleral icterus Neck: Negative for carotid bruits. JVP not elevated. Lungs: Rales at bases, coarse otherwise throughout without wheezing or rhonchi. Breathing is unlabored. Heart: RRR S1 S2 without murmurs, rubs, or gallops.  Abdomen: Soft, non-tender, non-distended with normoactive bowel sounds. No rebound/guarding. Extremities: No clubbing or cyanosis. No edema. Distal pedal pulses are 2+ and equal bilaterally. Neuro: Alert and oriented X 3. Moves all extremities spontaneously. Psych:  Responds to questions appropriately with a normal affect.   EKG:  The EKG was personally reviewed and demonstrates:  sinus tachycardia, NSIVCD with LBBB appearance, QTc 555m but difficult to discern with tachycardia, baseline wander, occasional PVCs Telemetry:  Telemetry was personally reviewed and demonstrates:  NSR/sinus tach low 100s, occ PVCs, few PVC triplets  Relevant CV Studies: Echo 01/02/23 1. Left ventricular ejection fraction, by estimation, is 25 to 30%. The  left ventricle has severely decreased function. The left ventricle  demonstrates regional wall motion abnormalities (see scoring  diagram/findings for description). There is mild  asymmetric left ventricular hypertrophy of the infero-lateral segment.  Left ventricular diastolic parameters are indeterminate.   2. Right ventricular systolic function is mildly reduced. The right  ventricular size is normal. There is normal pulmonary artery systolic  pressure. The estimated right ventricular systolic pressure is 2Q000111QmmHg.   3. Left atrial size was moderately dilated.   4. The mitral valve is grossly normal. Moderate to severe mitral valve  regurgitation with splay artifact, suggesting this may be underestimated.  No evidence of mitral stenosis.   5. The aortic valve is tricuspid. Aortic valve regurgitation is not  visualized. No aortic  stenosis is present.   6. The inferior vena cava is normal in size with greater than 50%  respiratory variability, suggesting right atrial pressure of 3 mmHg.    Laboratory Data:  High Sensitivity Troponin:   Recent Labs  Lab 01/03/23 0838 01/03/23 1212  TROPONINIHS 30* 28*     Chemistry Recent Labs  Lab 01/01/23 1055 01/01/23 1716 01/02/23 0434 01/03/23 0221  NA 141 143 139 137  K 3.7 3.8 4.2 4.1  CL 110  --  104 104  CO2 21*  --  23 23  GLUCOSE 156*  --  165* 169*  BUN 13  --  13 23  CREATININE 1.02  --  1.01 1.10  CALCIUM 8.3*  --  8.6* 8.5*  MG  --   --  2.3 2.1  GFRNONAA >60  --  >60 >60  ANIONGAP 10  --  12 10    Recent Labs  Lab 01/01/23 1055 01/02/23 0434 01/03/23 0221  PROT 5.7* 5.9* 5.6*  ALBUMIN 2.7* 2.4* 2.2*  AST 59* 49* 91*  ALT 137* 131* 143*  ALKPHOS 198* 189* 172*  BILITOT 0.5 0.5 0.7   Lipids No results for input(s): "CHOL", "TRIG", "HDL", "LABVLDL", "LDLCALC", "CHOLHDL" in the last 168 hours.  Hematology Recent Labs  Lab 01/01/23 1055 01/01/23 1716 01/02/23 0434 01/03/23 0221  WBC 20.5*  --  19.3* 21.2*  RBC 3.69*  --  3.77* 4.04*  HGB 11.7* 11.2* 11.7* 12.5*  HCT 36.0* 33.0* 35.9* 38.0*  MCV 97.6  --  95.2 94.1  MCH 31.7  --  31.0 30.9  MCHC 32.5  --  32.6 32.9  RDW 16.6*  --  16.5* 16.0*  PLT 490*  --  472* 409*   Thyroid No results for input(s): "TSH", "FREET4" in the last 168 hours.  BNP Recent Labs  Lab 01/01/23 1620  BNP 985.0*    DDimer No results for input(s): "DDIMER" in the last 168 hours.   Radiology/Studies:  DG CHEST PORT 1 VIEW  Result Date: 01/03/2023 CLINICAL DATA:  CHF EXAM: PORTABLE CHEST 1 VIEW COMPARISON:  Radiograph 01/01/2023 FINDINGS: Unchanged cardiomediastinal silhouette. There is persistent bilateral airspace disease, decreased in the right lung in comparison to prior. No large effusion or evidence of pneumothorax. Bones are unchanged. IMPRESSION: Persistent bilateral airspace disease, decreased  on the right in comparison prior exam, compatible with edema or infection. Electronically Signed   By: Maurine Simmering M.D.   On: 01/03/2023 11:54   ECHOCARDIOGRAM COMPLETE  Result Date: 01/02/2023    ECHOCARDIOGRAM REPORT   Patient Name:   Jordan Kennedy Date of Exam: 01/02/2023 Medical Rec #:  [id number]      Height:       74.0 in Accession #:    [id number]     Weight:       204.0 lb Date of Birth:  [birth year]      BSA:          2.192 m Patient Age:    27 years       BP:           118/83 mmHg Patient Gender: M              HR:           101 bpm. Exam Location:  Inpatient Procedure: 2D Echo, Cardiac Doppler, Color Doppler and Intracardiac            Opacification Agent Indications:    CHF  History:        Patient has no prior history of Echocardiogram examinations.                 CHF; Risk Factors:Current Smoker.  Sonographer:    Wenda Low Referring Phys: [id number] RONDELL A SMITH IMPRESSIONS  1. Left ventricular ejection fraction, by estimation, is 25 to 30%. The left ventricle has severely decreased function. The left ventricle demonstrates regional wall motion abnormalities (see scoring diagram/findings for description). There is mild asymmetric left ventricular hypertrophy of the infero-lateral segment. Left ventricular diastolic parameters are indeterminate.  2. Right ventricular systolic function is mildly reduced. The right ventricular size is normal. There is normal pulmonary artery systolic pressure. The estimated right ventricular systolic pressure is Q000111Q mmHg.  3. Left atrial size was moderately dilated.  4. The mitral valve is grossly normal. Moderate to severe mitral valve regurgitation with splay artifact, suggesting this may be underestimated. No evidence of mitral stenosis.  5. The aortic valve is tricuspid. Aortic valve regurgitation is not visualized. No aortic stenosis is present.  6. The inferior vena cava is normal in size with greater than 50% respiratory variability, suggesting right  atrial pressure of 3 mmHg. FINDINGS  Left Ventricle: Left ventricular ejection fraction, by estimation, is 25 to 30%. The left ventricle has severely decreased function. The left ventricle demonstrates regional wall motion abnormalities. Definity contrast agent was given IV to delineate the left ventricular endocardial borders. The left ventricular internal cavity size was normal in size. There is mild asymmetric left ventricular hypertrophy of  the infero-lateral segment. Left ventricular diastolic parameters are indeterminate.  LV Wall Scoring: The anterior septum, mid inferoseptal segment, and basal inferoseptal segment are akinetic. The apical septal segment is hypokinetic. Right Ventricle: The right ventricular size is normal. No increase in right ventricular wall thickness. Right ventricular systolic function is mildly reduced. There is normal pulmonary artery systolic pressure. The tricuspid regurgitant velocity is 2.12 m/s, and with an assumed right atrial pressure of 3 mmHg, the estimated right ventricular systolic pressure is Q000111Q mmHg. Left Atrium: Left atrial size was moderately dilated. Right Atrium: Right atrial size was normal in size. Pericardium: There is no evidence of pericardial effusion. Mitral Valve: The mitral valve is grossly normal. Moderate to severe mitral valve regurgitation. No evidence of mitral valve stenosis. MV peak gradient, 8.2 mmHg. The mean mitral valve gradient is 2.0 mmHg. Tricuspid Valve: The tricuspid valve is normal in structure. Tricuspid valve regurgitation is trivial. No evidence of tricuspid stenosis. Aortic Valve: The aortic valve is tricuspid. Aortic valve regurgitation is not visualized. No aortic stenosis is present. Aortic valve mean gradient measures 4.0 mmHg. Aortic valve peak gradient measures 7.7 mmHg. Aortic valve area, by VTI measures 2.22 cm. Pulmonic Valve: The pulmonic valve was normal in structure. Pulmonic valve regurgitation is trivial. No evidence of  pulmonic stenosis. Aorta: The aortic root is normal in size and structure. Venous: The inferior vena cava is normal in size with greater than 50% respiratory variability, suggesting right atrial pressure of 3 mmHg. IAS/Shunts: No atrial level shunt detected by color flow Doppler.  LEFT VENTRICLE PLAX 2D LVIDd:         6.00 cm LVIDs:         5.40 cm LV PW:         1.20 cm LV IVS:        0.90 cm LVOT diam:     2.00 cm LV SV:         59 LV SV Index:   27 LVOT Area:     3.14 cm  RIGHT VENTRICLE RV Basal diam:  4.90 cm TAPSE (M-mode): 2.2 cm LEFT ATRIUM              Index        RIGHT ATRIUM           Index LA diam:        4.70 cm  2.14 cm/m   RA Area:     18.80 cm LA Vol (A2C):   85.5 ml  39.01 ml/m  RA Volume:   58.50 ml  26.69 ml/m LA Vol (A4C):   99.5 ml  45.40 ml/m LA Biplane Vol: 102.0 ml 46.54 ml/m  AORTIC VALVE                    PULMONIC VALVE AV Area (Vmax):    2.10 cm     PV Vmax:       0.93 m/s AV Area (Vmean):   1.95 cm     PV Peak grad:  3.4 mmHg AV Area (VTI):     2.22 cm AV Vmax:           139.00 cm/s AV Vmean:          95.200 cm/s AV VTI:            0.267 m AV Peak Grad:      7.7 mmHg AV Mean Grad:      4.0 mmHg LVOT Vmax:  93.10 cm/s LVOT Vmean:        59.200 cm/s LVOT VTI:          0.189 m LVOT/AV VTI ratio: 0.71  AORTA Ao Root diam: 3.40 cm MITRAL VALVE               TRICUSPID VALVE MV Area (PHT): 3.79 cm    TR Peak grad:   18.0 mmHg MV Area VTI:   2.88 cm    TR Vmax:        212.00 cm/s MV Peak grad:  8.2 mmHg MV Mean grad:  2.0 mmHg    SHUNTS MV Vmax:       1.43 m/s    Systemic VTI:  0.19 m MV Vmean:      66.8 cm/s   Systemic Diam: 2.00 cm MV Decel Time: 200 msec MR Peak grad: 76.0 mmHg MR Mean grad: 44.0 mmHg MR Vmax:      436.00 cm/s MR Vmean:     308.0 cm/s MV E velocity: 94.30 cm/s Cherlynn Kaiser MD Electronically signed by Cherlynn Kaiser MD Signature Date/Time: 01/02/2023/2:57:54 PM    Final    DG Chest 1 View  Result Date: 01/01/2023 CLINICAL DATA:  Shortness of breath  and trouble breathing. EXAM: CHEST  1 VIEW COMPARISON:  06/29/2018 FINDINGS: The cardio pericardial silhouette is enlarged. Bilateral parahilar airspace disease, right greater than left, is new in the interval. Possible trace bilateral pleural effusions. The visualized bony structures of the thorax are unremarkable. IMPRESSION: Bilateral parahilar airspace disease, right greater than left, is new in the interval. Imaging features compatible with asymmetric pulmonary edema or diffuse infection. Electronically Signed   By: Misty Stanley M.D.   On: 01/01/2023 11:32     Assessment and Plan:   1. Acute hypoxic respiratory failure suspected multifactorial - febrile with CAP and new CHF - IM managing possible infective issue  2. Acute HFrEF of unknown chronicity, also with moderate-severe mitral regurgitation - with symptom presentation suspect component of volume overload - patient is poor historian, difficult to redirect, therefore suspect procedural interventions would present challenges where risk may outweigh benefit - agree with continuation of IV Lasix - will discuss advancement of GDMT with MD - SBP 1teens, remains mildly tachycardic - add daily weights - regarding MR, may be useful to requantify when more euvolemic - will also check TSH given exopthalmos  3. Occasional PVCs - lytes OK - will review GDMT plan with MD  4. Prolonged QT interval - K, Mg OK - in setting of NSIVCD - repeat EKG in AM  5. Transaminitis - per medicine team, albumin also very low as well - consider liver imaging to exclude cirrhosis as contributing factor to volume retention  6. Mild troponin elevation - low/flat, not felt c/w ACS, suspect demand ischemia  Risk Assessment/Risk Scores:     New York Heart Association (NYHA) Functional Class NYHA Class III-IV on arrival    For questions or updates, please contact [facility] [place] Please consult [email or web address] for contact info under  Signed, Charlie Pitter, PA-C  01/03/2023 4:21 PM  Patient seen and examined, note reviewed with the signed Advanced Practice Provider. I personally reviewed laboratory data, imaging studies and relevant notes. I independently examined the patient and formulated the important aspects of the plan. I have personally discussed the plan with the patient and/or family. Comments or changes to the note/plan are indicated below.  Patient seen examined his bedside.  His underlying mental disability does not  really allow the patient to give good history.  He is very distracted by the surroundings and has a very short attention span.  However he is oriented to self.  Acute on chronic hypoxic respiratory failure likely due to his pneumonia and new onset heart failure Acute heart failure with reduced ejection fraction EF 25 to 30% Moderate to severe mitral regurgitation Occasional PVCs Prolonged QT interval Transaminitis  Clinically he still appears to have some fluid volume which I do agree that he will benefit from continuing his IV Lasix at this time.  Please continue to monitor his daily weights as well as input and output.  Troponin is elevated but flat likely due to demand ischemia in the setting of his heart failure and infection.  In terms of his newly found depressed ejection fraction with wall motion abnormality for now I think is the best care is to pursue guideline medical therapy and follow the patient clinically.  He may not be able to tolerate following directions for any of our ischemic evaluations giving its it is very hard to redirect the patient.  In terms of coronary CT scan he may not be able to hold his breath which is an absolute hold still will not make him a good candidate for left heart catheterization for same reasons I do not believe the patient will be able to tolerate laying for a long period in an unclear study.  We can repeat his echocardiogram after being on guideline medical therapy and make  further clinical assessments at that time as well.  I agree with the TSH. Agree with continue to monitor his electrolytes.   Berniece Salines DO, MS [facility] Attending Cardiologist [place]  [address] [place], [place] [address] [phone] Website: [email or web address]

## 2023-01-03 NOTE — Progress Notes (Signed)
PROGRESS NOTE    Jordan Kennedy  [id number] DOB: Jan 25, 1956 DOA: 01/01/2023 PCP: Haydee Salter, MD   Brief Narrative:  HPI: Jordan Kennedy is a 67 y.o. male with medical history significant of cognitive impairment, schizophrenia, tardive dyskinesia, chronic constipation, with prior history of SBO who presents with complaints of cough and shortness of breath.  History is obtained from the patient with assistance of his caregiver from the group home.  Symptoms initially started a week and a half ago with a mild nonproductive cough.  Reported associated symptoms of nasal congestion and weakness, but had not been having any fevers at that time.  He had been initially seen by his primary care provider on 3/5 for symptoms.  He was checked for COVID-19 which was negative at that time and was thought to have a viral upper respiratory infection which symptomatic treatment was recommended.  Over the last 2 days patient had been admitted to seem more short of breath walking up and down the steps.  His cough that seemed to become more productive.  Patient does have a history of smoking cigarettes.  At baseline he is normally ambulatory without need of assistance and is not on oxygen at baseline.   In the emergency department patient was noted to be febrile up to 101.4 F with tachycardia, tachypnea, and O2 saturation is as low as 84% on room air with improvement on nonrebreather.  Labs significant for WBC 20.5, hemoglobin 11.7, platelet 490, calcium 8.3, alkaline phosphatase 198, AST 59, and ALT 137.  Chest x-ray concerning for bilateral perihilar airspace disease right greater than left concerning for asymmetric pulmonary edema or diffuse infection.  Patient had initially been given given Rocephin, azithromycin, and Ativan 1 mg p.o.  Assessment & Plan:   Principal Problem:   Severe sepsis ([place]) Active Problems:   Acute respiratory failure with hypoxia (HCC)   Bronchitis due to tobacco use   Prolonged  QT interval   Normocytic anemia   Transaminitis   Intellectual disability   Schizophrenia (HCC)   Thrombocytosis   Chronic constipation   Acute exacerbation of congestive heart failure (HCC)   CAP (community acquired pneumonia)   Acute systolic (congestive) heart failure (HCC)  Acute respiratory failure with hypoxia and severe sepsis secondary to community-acquired pneumonia as well as acute systolic congestive heart failure: Noted to be febrile up to 101.4 F with tachycardia, tachypnea, and white blood cell count elevated 20.5 and acute hypoxic respiratory failure.  Saturations noted to be as low as 84% on room air and required nonrebreather but only requiring 4 L of oxygen and maintaining saturation around 92%. chest x-ray noted bilateral perihilar airspace disease right greater than left with new interval opacities concerning for pulmonary edema or diffuse infection.  Procalcitonin 0.37 but BNP significantly elevated at 985 indicates that he has both acute on chronic congestive heart failure and possibly bacterial pneumonia but more so CHF.  Echo shows 25 to 30% ejection fraction which appears to be new.  Patient still complains of shortness of breath but clinically appears better than yesterday and still requiring 4 L of oxygen.  Still has crackles in the lungs.  I will obtain repeat chest x-ray.  I will continue Lasix 40 mg IV twice daily.  I have consulted cardiology for ischemic workup.  Continue current antibiotics of Rocephin and Zithromax.  All cultures are negative so far.    Possible bronchitis due to tobacco use: Patient was noted to be wheezing on physical exam at the  time of admission.  Patient does have a history of smoking tobacco.  No formal pulmonary function test on file.  He had been given Decadron IV in the emergency department.  He was started on prednisone.  I will continue that.   Prolonged QT interval Acute.  QTc was noted to be prolonged at 532, but patient was noted to  be tachycardic at that time. -Avoid QT prolonging medications.  Will repeat EKG in the morning.   Normocytic anemia Hemoglobin stable.   Transaminitis/elevated LFTs Acute.  On admission alkaline phosphatase 198, AST 59, and ALT 137.  Viral hepatitis panel negative.  LFTs improving.  Intellectual disability Patient currently lives at a group home due to cognitive impairment. Continue sitter   Schizophrenia -Clozapine deferred  due to prolonged QT interval   Thrombocytosis Acute.  Platelet count elevated at 490.  Thought reactive secondary to above.  Improving.   Chronic constipation -Continue senna-docusate, Linzess   Tobacco abuse Patient still reports smoking cigarettes. -Nicotine patch offered  DVT prophylaxis: enoxaparin (LOVENOX) injection 40 mg Start: 01/01/23 1600   Code Status: Full Code  Family Communication:  None present at bedside.   Status is: Inpatient Remains inpatient appropriate because: Patient still hypoxic and requires continued hospitalization.  Needs evaluation by cardiology.   Estimated body mass index is 26.19 kg/m as calculated from the following:   Height as of this encounter: '6\' 2"'$  (1.88 m).   Weight as of this encounter: 92.5 kg.    Nutritional Assessment: Body mass index is 26.19 kg/m.Marland Kitchen Seen by dietician.  I agree with the assessment and plan as outlined below: Nutrition Status:        . Skin Assessment: I have examined the patient's skin and I agree with the wound assessment as performed by the wound care RN as outlined below:    Consultants:  None  Procedures:  None  Antimicrobials:  Anti-infectives (From admission, onward)    Start     Dose/Rate Route Frequency Ordered Stop   01/02/23 1000  cefTRIAXone (ROCEPHIN) 2 g in sodium chloride 0.9 % 100 mL IVPB        2 g 200 mL/hr over 30 Minutes Intravenous Every 24 hours 01/01/23 1458 01/06/23 0959   01/02/23 1000  doxycycline (VIBRAMYCIN) 100 mg in sodium chloride 0.9 % 250  mL IVPB        100 mg 125 mL/hr over 120 Minutes Intravenous Every 12 hours 01/01/23 1458     01/01/23 1200  cefTRIAXone (ROCEPHIN) 2 g in sodium chloride 0.9 % 100 mL IVPB        2 g 200 mL/hr over 30 Minutes Intravenous  Once 01/01/23 1159 01/01/23 1350   01/01/23 1200  azithromycin (ZITHROMAX) 500 mg in sodium chloride 0.9 % 250 mL IVPB        500 mg 250 mL/hr over 60 Minutes Intravenous  Once 01/01/23 1159 01/01/23 1523         Subjective:  Seen and examined.  He says that he still has shortness of breath and cough.  He is fully alert and oriented today.  Objective: Vitals:   01/02/23 2309 01/03/23 0300 01/03/23 0800 01/03/23 0851  BP: 120/79 111/81 113/83   Pulse: (!) 102 95 (!) 101   Resp: 20 20 (!) 30   Temp: 98.9 F (37.2 C) 98.6 F (37 C) 99.7 F (37.6 C)   TempSrc: Oral Oral Oral   SpO2: 91% 92% 91% 93%  Weight:      Height:  Intake/Output Summary (Last 24 hours) at 01/03/2023 1010 Last data filed at 01/03/2023 0928 Gross per 24 hour  Intake 1431.52 ml  Output 2650 ml  Net -1218.48 ml    Filed Weights   01/01/23 1048  Weight: 92.5 kg    Examination:  General exam: Appears calm and comfortable  Respiratory system: Bibasilar crackles. Respiratory effort normal. Cardiovascular system: S1 & S2 heard, RRR. No JVD, murmurs, rubs, gallops or clicks. No pedal edema. Gastrointestinal system: Abdomen is nondistended, soft and nontender. No organomegaly or masses felt. Normal bowel sounds heard. Central nervous system: Alert and oriented. No focal neurological deficits. Extremities: Symmetric 5 x 5 power. Skin: No rashes, lesions or ulcers.  Psychiatry: Judgement and insight appear poor.  Data Reviewed: I have personally reviewed following labs and imaging studies  CBC: Recent Labs  Lab 01/01/23 1055 01/01/23 1716 01/02/23 0434 01/03/23 0221  WBC 20.5*  --  19.3* 21.2*  NEUTROABS 17.5*  --   --  17.2*  HGB 11.7* 11.2* 11.7* 12.5*  HCT 36.0*  33.0* 35.9* 38.0*  MCV 97.6  --  95.2 94.1  PLT 490*  --  472* 409*    Basic Metabolic Panel: Recent Labs  Lab 01/01/23 1055 01/01/23 1716 01/02/23 0434 01/03/23 0221  NA 141 143 139 137  K 3.7 3.8 4.2 4.1  CL 110  --  104 104  CO2 21*  --  23 23  GLUCOSE 156*  --  165* 169*  BUN 13  --  13 23  CREATININE 1.02  --  1.01 1.10  CALCIUM 8.3*  --  8.6* 8.5*  MG  --   --  2.3 2.1    GFR: Estimated Creatinine Clearance: 78.9 mL/min (by C-G formula based on SCr of 1.1 mg/dL). Liver Function Tests: Recent Labs  Lab 01/01/23 1055 01/02/23 0434 01/03/23 0221  AST 59* 49* 91*  ALT 137* 131* 143*  ALKPHOS 198* 189* 172*  BILITOT 0.5 0.5 0.7  PROT 5.7* 5.9* 5.6*  ALBUMIN 2.7* 2.4* 2.2*    No results for input(s): "LIPASE", "AMYLASE" in the last 168 hours. No results for input(s): "AMMONIA" in the last 168 hours. Coagulation Profile: No results for input(s): "INR", "PROTIME" in the last 168 hours. Cardiac Enzymes: No results for input(s): "CKTOTAL", "CKMB", "CKMBINDEX", "TROPONINI" in the last 168 hours. BNP (last 3 results) No results for input(s): "PROBNP" in the last 8760 hours. HbA1C: No results for input(s): "HGBA1C" in the last 72 hours. CBG: Recent Labs  Lab 01/01/23 1622  GLUCAP 134*    Lipid Profile: No results for input(s): "CHOL", "HDL", "LDLCALC", "TRIG", "CHOLHDL", "LDLDIRECT" in the last 72 hours. Thyroid Function Tests: No results for input(s): "TSH", "T4TOTAL", "FREET4", "T3FREE", "THYROIDAB" in the last 72 hours. Anemia Panel: No results for input(s): "VITAMINB12", "FOLATE", "FERRITIN", "TIBC", "IRON", "RETICCTPCT" in the last 72 hours. Sepsis Labs: Recent Labs  Lab 01/01/23 1620 01/01/23 1630  PROCALCITON 0.37  --   LATICACIDVEN  --  1.4     Recent Results (from the past 240 hour(s))  Resp panel by RT-PCR (RSV, Flu A&B, Covid) Anterior Nasal Swab     Status: None   Collection Time: 01/01/23 10:54 AM   Specimen: Anterior Nasal Swab   Result Value Ref Range Status   SARS Coronavirus 2 by RT PCR NEGATIVE NEGATIVE Final   Influenza A by PCR NEGATIVE NEGATIVE Final   Influenza B by PCR NEGATIVE NEGATIVE Final    Comment: (NOTE) The Xpert Xpress SARS-CoV-2/FLU/RSV plus assay is  intended as an aid in the diagnosis of influenza from Nasopharyngeal swab specimens and should not be used as a sole basis for treatment. Nasal washings and aspirates are unacceptable for Xpert Xpress SARS-CoV-2/FLU/RSV testing.  Fact Sheet for Patients: EntrepreneurPulse.com.au  Fact Sheet for Healthcare Providers: IncredibleEmployment.be  This test is not yet approved or cleared by the [place] FDA and has been authorized for detection and/or diagnosis of SARS-CoV-2 by FDA under an Emergency Use Authorization (EUA). This EUA will remain in effect (meaning this test can be used) for the duration of the COVID-19 declaration under Section 564(b)(1) of the Act, 21 U.S.C. section 360bbb-3(b)(1), unless the authorization is terminated or revoked.     Resp Syncytial Virus by PCR NEGATIVE NEGATIVE Final    Comment: (NOTE) Fact Sheet for Patients: EntrepreneurPulse.com.au  Fact Sheet for Healthcare Providers: IncredibleEmployment.be  This test is not yet approved or cleared by the [place] FDA and has been authorized for detection and/or diagnosis of SARS-CoV-2 by FDA under an Emergency Use Authorization (EUA). This EUA will remain in effect (meaning this test can be used) for the duration of the COVID-19 declaration under Section 564(b)(1) of the Act, 21 U.S.C. section 360bbb-3(b)(1), unless the authorization is terminated or revoked.  Performed at [facility], [place] [address] [place], [place] [address]   Respiratory (~20 pathogens) panel by PCR     Status: None   Collection Time: 01/01/23 11:21 AM   Specimen: Nasopharyngeal Swab; Respiratory   Result Value Ref Range Status   Adenovirus NOT DETECTED NOT DETECTED Final   Coronavirus 229E NOT DETECTED NOT DETECTED Final    Comment: (NOTE) The Coronavirus on the Respiratory Panel, DOES NOT test for the novel  Coronavirus (2019 nCoV)    Coronavirus HKU1 NOT DETECTED NOT DETECTED Final   Coronavirus NL63 NOT DETECTED NOT DETECTED Final   Coronavirus OC43 NOT DETECTED NOT DETECTED Final   Metapneumovirus NOT DETECTED NOT DETECTED Final   Rhinovirus / Enterovirus NOT DETECTED NOT DETECTED Final   Influenza A NOT DETECTED NOT DETECTED Final   Influenza B NOT DETECTED NOT DETECTED Final   Parainfluenza Virus 1 NOT DETECTED NOT DETECTED Final   Parainfluenza Virus 2 NOT DETECTED NOT DETECTED Final   Parainfluenza Virus 3 NOT DETECTED NOT DETECTED Final   Parainfluenza Virus 4 NOT DETECTED NOT DETECTED Final   Respiratory Syncytial Virus NOT DETECTED NOT DETECTED Final   Bordetella pertussis NOT DETECTED NOT DETECTED Final   Bordetella Parapertussis NOT DETECTED NOT DETECTED Final   Chlamydophila pneumoniae NOT DETECTED NOT DETECTED Final   Mycoplasma pneumoniae NOT DETECTED NOT DETECTED Final    Comment: Performed at [facility], [place]. [address], [place], [place] [address]  Culture, blood (Routine X 2) w Reflex to ID Panel     Status: None (Preliminary result)   Collection Time: 01/01/23  4:43 PM   Specimen: BLOOD  Result Value Ref Range Status   Specimen Description BLOOD RIGHT ANTECUBITAL  Final   Special Requests   Final    BOTTLES DRAWN AEROBIC AND ANAEROBIC Blood Culture adequate volume   Culture   Final    NO GROWTH < 12 HOURS Performed at [facility], [place] [address] [place], [place] [address]    Report Status PENDING  Incomplete  Culture, blood (Routine X 2) w Reflex to ID Panel     Status: None (Preliminary result)   Collection Time: 01/01/23  8:55 PM   Specimen: BLOOD  Result Value  Ref Range Status   Specimen Description BLOOD LEFT ANTECUBITAL  Final    Special Requests Blood Culture adequate volume  Final   Culture   Final    NO GROWTH < 12 HOURS Performed at [facility], [place] [address] [place], [place] [address]    Report Status PENDING  Incomplete     Radiology Studies: ECHOCARDIOGRAM COMPLETE  Result Date: 01/02/2023    ECHOCARDIOGRAM REPORT   Patient Name:   SAMRUDH BORGO Date of Exam: 01/02/2023 Medical Rec #:  [id number]      Height:       74.0 in Accession #:    [id number]     Weight:       204.0 lb Date of Birth:  1955-12-12      BSA:          2.192 m Patient Age:    12 years       BP:           118/83 mmHg Patient Gender: M              HR:           101 bpm. Exam Location:  Inpatient Procedure: 2D Echo, Cardiac Doppler, Color Doppler and Intracardiac            Opacification Agent Indications:    CHF  History:        Patient has no prior history of Echocardiogram examinations.                 CHF; Risk Factors:Current Smoker.  Sonographer:    Wenda Low Referring Phys: [id number] RONDELL A SMITH IMPRESSIONS  1. Left ventricular ejection fraction, by estimation, is 25 to 30%. The left ventricle has severely decreased function. The left ventricle demonstrates regional wall motion abnormalities (see scoring diagram/findings for description). There is mild asymmetric left ventricular hypertrophy of the infero-lateral segment. Left ventricular diastolic parameters are indeterminate.  2. Right ventricular systolic function is mildly reduced. The right ventricular size is normal. There is normal pulmonary artery systolic pressure. The estimated right ventricular systolic pressure is Q000111Q mmHg.  3. Left atrial size was moderately dilated.  4. The mitral valve is grossly normal. Moderate to severe mitral valve regurgitation with splay artifact, suggesting this may be underestimated. No evidence of mitral stenosis.  5. The aortic valve is tricuspid. Aortic valve regurgitation is not visualized. No aortic stenosis is present.  6. The  inferior vena cava is normal in size with greater than 50% respiratory variability, suggesting right atrial pressure of 3 mmHg. FINDINGS  Left Ventricle: Left ventricular ejection fraction, by estimation, is 25 to 30%. The left ventricle has severely decreased function. The left ventricle demonstrates regional wall motion abnormalities. Definity contrast agent was given IV to delineate the left ventricular endocardial borders. The left ventricular internal cavity size was normal in size. There is mild asymmetric left ventricular hypertrophy of the infero-lateral segment. Left ventricular diastolic parameters are indeterminate.  LV Wall Scoring: The anterior septum, mid inferoseptal segment, and basal inferoseptal segment are akinetic. The apical septal segment is hypokinetic. Right Ventricle: The right ventricular size is normal. No increase in right ventricular wall thickness. Right ventricular systolic function is mildly reduced. There is normal pulmonary artery systolic pressure. The tricuspid regurgitant velocity is 2.12 m/s, and with an assumed right atrial pressure of 3 mmHg, the estimated right ventricular systolic pressure is Q000111Q mmHg. Left Atrium: Left atrial size was moderately dilated. Right Atrium: Right atrial size  was normal in size. Pericardium: There is no evidence of pericardial effusion. Mitral Valve: The mitral valve is grossly normal. Moderate to severe mitral valve regurgitation. No evidence of mitral valve stenosis. MV peak gradient, 8.2 mmHg. The mean mitral valve gradient is 2.0 mmHg. Tricuspid Valve: The tricuspid valve is normal in structure. Tricuspid valve regurgitation is trivial. No evidence of tricuspid stenosis. Aortic Valve: The aortic valve is tricuspid. Aortic valve regurgitation is not visualized. No aortic stenosis is present. Aortic valve mean gradient measures 4.0 mmHg. Aortic valve peak gradient measures 7.7 mmHg. Aortic valve area, by VTI measures 2.22 cm. Pulmonic Valve:  The pulmonic valve was normal in structure. Pulmonic valve regurgitation is trivial. No evidence of pulmonic stenosis. Aorta: The aortic root is normal in size and structure. Venous: The inferior vena cava is normal in size with greater than 50% respiratory variability, suggesting right atrial pressure of 3 mmHg. IAS/Shunts: No atrial level shunt detected by color flow Doppler.  LEFT VENTRICLE PLAX 2D LVIDd:         6.00 cm LVIDs:         5.40 cm LV PW:         1.20 cm LV IVS:        0.90 cm LVOT diam:     2.00 cm LV SV:         59 LV SV Index:   27 LVOT Area:     3.14 cm  RIGHT VENTRICLE RV Basal diam:  4.90 cm TAPSE (M-mode): 2.2 cm LEFT ATRIUM              Index        RIGHT ATRIUM           Index LA diam:        4.70 cm  2.14 cm/m   RA Area:     18.80 cm LA Vol (A2C):   85.5 ml  39.01 ml/m  RA Volume:   58.50 ml  26.69 ml/m LA Vol (A4C):   99.5 ml  45.40 ml/m LA Biplane Vol: 102.0 ml 46.54 ml/m  AORTIC VALVE                    PULMONIC VALVE AV Area (Vmax):    2.10 cm     PV Vmax:       0.93 m/s AV Area (Vmean):   1.95 cm     PV Peak grad:  3.4 mmHg AV Area (VTI):     2.22 cm AV Vmax:           139.00 cm/s AV Vmean:          95.200 cm/s AV VTI:            0.267 m AV Peak Grad:      7.7 mmHg AV Mean Grad:      4.0 mmHg LVOT Vmax:         93.10 cm/s LVOT Vmean:        59.200 cm/s LVOT VTI:          0.189 m LVOT/AV VTI ratio: 0.71  AORTA Ao Root diam: 3.40 cm MITRAL VALVE               TRICUSPID VALVE MV Area (PHT): 3.79 cm    TR Peak grad:   18.0 mmHg MV Area VTI:   2.88 cm    TR Vmax:        212.00 cm/s MV Peak grad:  8.2 mmHg  MV Mean grad:  2.0 mmHg    SHUNTS MV Vmax:       1.43 m/s    Systemic VTI:  0.19 m MV Vmean:      66.8 cm/s   Systemic Diam: 2.00 cm MV Decel Time: 200 msec MR Peak grad: 76.0 mmHg MR Mean grad: 44.0 mmHg MR Vmax:      436.00 cm/s MR Vmean:     308.0 cm/s MV E velocity: 94.30 cm/s Cherlynn Kaiser MD Electronically signed by Cherlynn Kaiser MD Signature Date/Time:  01/02/2023/2:57:54 PM    Final    DG Chest 1 View  Result Date: 01/01/2023 CLINICAL DATA:  Shortness of breath and trouble breathing. EXAM: CHEST  1 VIEW COMPARISON:  06/29/2018 FINDINGS: The cardio pericardial silhouette is enlarged. Bilateral parahilar airspace disease, right greater than left, is new in the interval. Possible trace bilateral pleural effusions. The visualized bony structures of the thorax are unremarkable. IMPRESSION: Bilateral parahilar airspace disease, right greater than left, is new in the interval. Imaging features compatible with asymmetric pulmonary edema or diffuse infection. Electronically Signed   By: Misty Stanley M.D.   On: 01/01/2023 11:32    Scheduled Meds:  amantadine  100 mg Oral QHS   aspirin EC  81 mg Oral Daily   clotrimazole   Topical BID   cloZAPine  200 mg Oral BID   enoxaparin (LOVENOX) injection  40 mg Subcutaneous Q24H   furosemide  40 mg Intravenous BID   guaiFENesin  600 mg Oral BID   levalbuterol  0.63 mg Nebulization QID   linaclotide  290 mcg Oral QAC breakfast   melatonin  10 mg Oral QHS   metoCLOPramide  5 mg Oral BID   mirabegron ER  50 mg Oral Daily   nicotine  14 mg Transdermal Daily   pantoprazole  40 mg Oral QPM   PARoxetine  40 mg Oral QPM   predniSONE  40 mg Oral Q breakfast   senna-docusate  1 tablet Oral BID   sodium chloride flush  3 mL Intravenous Q12H   tamsulosin  0.4 mg Oral Daily   zolpidem  10 mg Oral QHS   Continuous Infusions:  cefTRIAXone (ROCEPHIN)  IV 2 g (01/03/23 0924)   doxycycline (VIBRAMYCIN) IV 100 mg (01/02/23 2259)     LOS: 2 days   Darliss Cheney, MD [facility]  01/03/2023, 10:10 AM   *Please note that this is a verbal dictation therefore any spelling or grammatical errors are due to the "[place] [employer]" system interpretation.  Please page via [place] and do not message via secure chat for urgent patient care matters. Secure chat can be used for non urgent patient care matters.  How to  contact the [facility] Attending or Consulting provider [place] or covering provider during after hours [place], for this patient?  Check the care team in [facility] and look for a) attending/consulting [facility] provider listed and b) the [facility] team listed. Page or secure chat [phone]. Log into [email or web address] and use [place]'s universal password to access. If you do not have the password, please contact the hospital operator. Locate the [facility] provider you are looking for under [facility] and page to a number that you can be directly reached. If you still have difficulty reaching the provider, please page the [facility] (Director on Call) for the Hospitalists listed on amion for assistance.

## 2023-01-03 NOTE — Progress Notes (Signed)
Pt noted to be in no respiratory distress at this time. Pt also stated feeling some what nauseous this morning. Bipap is at bedside if needed but is not needed at this time.

## 2023-01-04 ENCOUNTER — Encounter: Payer: Medicare Other | Admitting: Family Medicine

## 2023-01-04 DIAGNOSIS — R6520 Severe sepsis without septic shock: Secondary | ICD-10-CM | POA: Diagnosis not present

## 2023-01-04 DIAGNOSIS — I5023 Acute on chronic systolic (congestive) heart failure: Secondary | ICD-10-CM

## 2023-01-04 DIAGNOSIS — A419 Sepsis, unspecified organism: Secondary | ICD-10-CM | POA: Diagnosis not present

## 2023-01-04 LAB — CBC WITH DIFFERENTIAL/PLATELET
Abs Immature Granulocytes: 0.13 10*3/uL — ABNORMAL HIGH (ref 0.00–0.07)
Basophils Absolute: 0 10*3/uL (ref 0.0–0.1)
Basophils Relative: 0 %
Eosinophils Absolute: 0.2 10*3/uL (ref 0.0–0.5)
Eosinophils Relative: 1 %
HCT: 41.7 % (ref 39.0–52.0)
Hemoglobin: 13.2 g/dL (ref 13.0–17.0)
Immature Granulocytes: 1 %
Lymphocytes Relative: 13 %
Lymphs Abs: 2.4 10*3/uL (ref 0.7–4.0)
MCH: 30.1 pg (ref 26.0–34.0)
MCHC: 31.7 g/dL (ref 30.0–36.0)
MCV: 95.2 fL (ref 80.0–100.0)
Monocytes Absolute: 1.8 10*3/uL — ABNORMAL HIGH (ref 0.1–1.0)
Monocytes Relative: 10 %
Neutro Abs: 14.6 10*3/uL — ABNORMAL HIGH (ref 1.7–7.7)
Neutrophils Relative %: 75 %
Platelets: 563 10*3/uL — ABNORMAL HIGH (ref 150–400)
RBC: 4.38 MIL/uL (ref 4.22–5.81)
RDW: 16 % — ABNORMAL HIGH (ref 11.5–15.5)
WBC: 19.2 10*3/uL — ABNORMAL HIGH (ref 4.0–10.5)
nRBC: 0 % (ref 0.0–0.2)

## 2023-01-04 LAB — PROCALCITONIN: Procalcitonin: 0.7 ng/mL

## 2023-01-04 LAB — COMPREHENSIVE METABOLIC PANEL
ALT: 240 U/L — ABNORMAL HIGH (ref 0–44)
AST: 69 U/L — ABNORMAL HIGH (ref 15–41)
Albumin: 2.5 g/dL — ABNORMAL LOW (ref 3.5–5.0)
Alkaline Phosphatase: 187 U/L — ABNORMAL HIGH (ref 38–126)
Anion gap: 11 (ref 5–15)
BUN: 15 mg/dL (ref 8–23)
CO2: 25 mmol/L (ref 22–32)
Calcium: 8.9 mg/dL (ref 8.9–10.3)
Chloride: 104 mmol/L (ref 98–111)
Creatinine, Ser: 0.83 mg/dL (ref 0.61–1.24)
GFR, Estimated: >60 mL/min (ref >60)
Glucose, Bld: 116 mg/dL — ABNORMAL HIGH (ref 70–99)
Potassium: 3.9 mmol/L (ref 3.5–5.1)
Sodium: 140 mmol/L (ref 135–145)
Total Bilirubin: 0.5 mg/dL (ref 0.3–1.2)
Total Protein: 6.3 g/dL — ABNORMAL LOW (ref 6.5–8.1)

## 2023-01-04 LAB — TSH: TSH: 0.85 u[IU]/mL (ref 0.350–4.500)

## 2023-01-04 MED ORDER — METOPROLOL TARTRATE 12.5 MG HALF TABLET
12.5000 mg | ORAL_TABLET | Freq: Two times a day (BID) | ORAL | Status: DC
  Administered 2023-01-04 – 2023-01-07 (×6): 12.5 mg via ORAL
  Filled 2023-01-04 (×6): qty 1

## 2023-01-04 MED ORDER — LEVALBUTEROL HCL 0.63 MG/3ML IN NEBU
0.6300 mg | INHALATION_SOLUTION | Freq: Three times a day (TID) | RESPIRATORY_TRACT | Status: DC
  Administered 2023-01-04 – 2023-01-05 (×3): 0.63 mg via RESPIRATORY_TRACT
  Filled 2023-01-04 (×3): qty 3

## 2023-01-04 NOTE — Progress Notes (Addendum)
Rounding Note    Patient Name: Jordan Kennedy Date of Encounter: 01/04/2023  [place] Cardiologist: Berniece Salines, DO   Subjective   Patient continues to struggle as a historian.  Denies active chest pain, shortness of breath, palpitations.  Inpatient Medications    Scheduled Meds:  amantadine  100 mg Oral QHS   aspirin EC  81 mg Oral Daily   clotrimazole   Topical BID   cloZAPine  200 mg Oral BID   enoxaparin (LOVENOX) injection  40 mg Subcutaneous Q24H   furosemide  40 mg Intravenous BID   guaiFENesin  600 mg Oral BID   levalbuterol  0.63 mg Nebulization TID   linaclotide  290 mcg Oral QAC breakfast   melatonin  10 mg Oral QHS   metoCLOPramide  5 mg Oral BID   mirabegron ER  50 mg Oral Daily   nicotine  14 mg Transdermal Daily   pantoprazole  40 mg Oral QPM   PARoxetine  40 mg Oral QPM   predniSONE  40 mg Oral Q breakfast   senna-docusate  1 tablet Oral BID   sodium chloride flush  3 mL Intravenous Q12H   tamsulosin  0.4 mg Oral Daily   zolpidem  10 mg Oral QHS   Continuous Infusions:  cefTRIAXone (ROCEPHIN)  IV 2 g (01/04/23 0949)   doxycycline (VIBRAMYCIN) IV 100 mg (01/04/23 1144)   PRN Meds: acetaminophen **OR** acetaminophen, albuterol, trimethobenzamide   Vital Signs    Vitals:   01/04/23 0400 01/04/23 0808 01/04/23 0931 01/04/23 1217  BP: (!) 118/91 (!) 132/104  (!) 122/92  Pulse: 98 (!) 103    Resp: (!) 25 (!) 25 20 20   Temp: 97.8 F (36.6 C) 98.1 F (36.7 C)  98.4 F (36.9 C)  TempSrc: Oral Axillary  Oral  SpO2: 96% 99% 100%   Weight:      Height:        Intake/Output Summary (Last 24 hours) at 01/04/2023 1403 Last data filed at 01/04/2023 0510 Gross per 24 hour  Intake --  Output 2850 ml  Net -2850 ml      01/01/2023   10:48 AM 12/25/2022    3:54 PM 10/16/2022    4:11 PM  Last 3 Weights  Weight (lbs) 204 lb 204 lb 198 lb  Weight (kg) 92.534 kg 92.534 kg 89.812 kg      Telemetry    Sinus rhythm with LVH pattern-  Personally Reviewed  ECG    Sinus rhythm with significant LVH - Personally Reviewed  Physical Exam   GEN: No acute distress.   Neck: No JVD Cardiac: RRR, no murmurs, rubs, or gallops.  Respiratory: Clear to auscultation bilaterally. GI: Soft, nontender, non-distended  MS: No edema; No deformity. Neuro:  Nonfocal  Psych: Normal affect   Labs    High Sensitivity Troponin:   Recent Labs  Lab 01/03/23 0838 01/03/23 1212 01/03/23 1604  TROPONINIHS 30* 28* 26*     Chemistry Recent Labs  Lab 01/02/23 0434 01/03/23 0221 01/04/23 1113  NA 139 137 140  K 4.2 4.1 3.9  CL 104 104 104  CO2 23 23 25   GLUCOSE 165* 169* 116*  BUN 13 23 15   CREATININE 1.01 1.10 0.83  CALCIUM 8.6* 8.5* 8.9  MG 2.3 2.1  --   PROT 5.9* 5.6* 6.3*  ALBUMIN 2.4* 2.2* 2.5*  AST 49* 91* 69*  ALT 131* 143* 240*  ALKPHOS 189* 172* 187*  BILITOT 0.5 0.7 0.5  GFRNONAA >60 >  60 >60  ANIONGAP 12 10 11     Lipids No results for input(s): "CHOL", "TRIG", "HDL", "LABVLDL", "LDLCALC", "CHOLHDL" in the last 168 hours.  Hematology Recent Labs  Lab 01/02/23 0434 01/03/23 0221 01/04/23 1113  WBC 19.3* 21.2* 19.2*  RBC 3.77* 4.04* 4.38  HGB 11.7* 12.5* 13.2  HCT 35.9* 38.0* 41.7  MCV 95.2 94.1 95.2  MCH 31.0 30.9 30.1  MCHC 32.6 32.9 31.7  RDW 16.5* 16.0* 16.0*  PLT 472* 409* 563*   Thyroid  Recent Labs  Lab 01/04/23 1113  TSH 0.850    BNP Recent Labs  Lab 01/01/23 1620  BNP 985.0*    DDimer No results for input(s): "DDIMER" in the last 168 hours.   Radiology    DG CHEST PORT 1 VIEW  Result Date: 01/03/2023 CLINICAL DATA:  CHF EXAM: PORTABLE CHEST 1 VIEW COMPARISON:  Radiograph 01/01/2023 FINDINGS: Unchanged cardiomediastinal silhouette. There is persistent bilateral airspace disease, decreased in the right lung in comparison to prior. No large effusion or evidence of pneumothorax. Bones are unchanged. IMPRESSION: Persistent bilateral airspace disease, decreased on the right in comparison  prior exam, compatible with edema or infection. Electronically Signed   By: Maurine Simmering M.D.   On: 01/03/2023 11:54   ECHOCARDIOGRAM COMPLETE  Result Date: 01/02/2023    ECHOCARDIOGRAM REPORT   Patient Name:   ZAIR CASSERLY Date of Exam: 01/02/2023 Medical Rec #:  [id number]      Height:       74.0 in Accession #:    [id number]     Weight:       204.0 lb Date of Birth:  03/01/1956      BSA:          2.192 m Patient Age:    62 years       BP:           118/83 mmHg Patient Gender: M              HR:           101 bpm. Exam Location:  Inpatient Procedure: 2D Echo, Cardiac Doppler, Color Doppler and Intracardiac            Opacification Agent Indications:    CHF  History:        Patient has no prior history of Echocardiogram examinations.                 CHF; Risk Factors:Current Smoker.  Sonographer:    Wenda Low Referring Phys: [id number] RONDELL A SMITH IMPRESSIONS  1. Left ventricular ejection fraction, by estimation, is 25 to 30%. The left ventricle has severely decreased function. The left ventricle demonstrates regional wall motion abnormalities (see scoring diagram/findings for description). There is mild asymmetric left ventricular hypertrophy of the infero-lateral segment. Left ventricular diastolic parameters are indeterminate.  2. Right ventricular systolic function is mildly reduced. The right ventricular size is normal. There is normal pulmonary artery systolic pressure. The estimated right ventricular systolic pressure is Q000111Q mmHg.  3. Left atrial size was moderately dilated.  4. The mitral valve is grossly normal. Moderate to severe mitral valve regurgitation with splay artifact, suggesting this may be underestimated. No evidence of mitral stenosis.  5. The aortic valve is tricuspid. Aortic valve regurgitation is not visualized. No aortic stenosis is present.  6. The inferior vena cava is normal in size with greater than 50% respiratory variability, suggesting right atrial pressure of 3 mmHg.  FINDINGS  Left Ventricle: Left ventricular ejection fraction,  by estimation, is 25 to 30%. The left ventricle has severely decreased function. The left ventricle demonstrates regional wall motion abnormalities. Definity contrast agent was given IV to delineate the left ventricular endocardial borders. The left ventricular internal cavity size was normal in size. There is mild asymmetric left ventricular hypertrophy of the infero-lateral segment. Left ventricular diastolic parameters are indeterminate.  LV Wall Scoring: The anterior septum, mid inferoseptal segment, and basal inferoseptal segment are akinetic. The apical septal segment is hypokinetic. Right Ventricle: The right ventricular size is normal. No increase in right ventricular wall thickness. Right ventricular systolic function is mildly reduced. There is normal pulmonary artery systolic pressure. The tricuspid regurgitant velocity is 2.12 m/s, and with an assumed right atrial pressure of 3 mmHg, the estimated right ventricular systolic pressure is Q000111Q mmHg. Left Atrium: Left atrial size was moderately dilated. Right Atrium: Right atrial size was normal in size. Pericardium: There is no evidence of pericardial effusion. Mitral Valve: The mitral valve is grossly normal. Moderate to severe mitral valve regurgitation. No evidence of mitral valve stenosis. MV peak gradient, 8.2 mmHg. The mean mitral valve gradient is 2.0 mmHg. Tricuspid Valve: The tricuspid valve is normal in structure. Tricuspid valve regurgitation is trivial. No evidence of tricuspid stenosis. Aortic Valve: The aortic valve is tricuspid. Aortic valve regurgitation is not visualized. No aortic stenosis is present. Aortic valve mean gradient measures 4.0 mmHg. Aortic valve peak gradient measures 7.7 mmHg. Aortic valve area, by VTI measures 2.22 cm. Pulmonic Valve: The pulmonic valve was normal in structure. Pulmonic valve regurgitation is trivial. No evidence of pulmonic stenosis. Aorta: The  aortic root is normal in size and structure. Venous: The inferior vena cava is normal in size with greater than 50% respiratory variability, suggesting right atrial pressure of 3 mmHg. IAS/Shunts: No atrial level shunt detected by color flow Doppler.  LEFT VENTRICLE PLAX 2D LVIDd:         6.00 cm LVIDs:         5.40 cm LV PW:         1.20 cm LV IVS:        0.90 cm LVOT diam:     2.00 cm LV SV:         59 LV SV Index:   27 LVOT Area:     3.14 cm  RIGHT VENTRICLE RV Basal diam:  4.90 cm TAPSE (M-mode): 2.2 cm LEFT ATRIUM              Index        RIGHT ATRIUM           Index LA diam:        4.70 cm  2.14 cm/m   RA Area:     18.80 cm LA Vol (A2C):   85.5 ml  39.01 ml/m  RA Volume:   58.50 ml  26.69 ml/m LA Vol (A4C):   99.5 ml  45.40 ml/m LA Biplane Vol: 102.0 ml 46.54 ml/m  AORTIC VALVE                    PULMONIC VALVE AV Area (Vmax):    2.10 cm     PV Vmax:       0.93 m/s AV Area (Vmean):   1.95 cm     PV Peak grad:  3.4 mmHg AV Area (VTI):     2.22 cm AV Vmax:           139.00 cm/s AV Vmean:  95.200 cm/s AV VTI:            0.267 m AV Peak Grad:      7.7 mmHg AV Mean Grad:      4.0 mmHg LVOT Vmax:         93.10 cm/s LVOT Vmean:        59.200 cm/s LVOT VTI:          0.189 m LVOT/AV VTI ratio: 0.71  AORTA Ao Root diam: 3.40 cm MITRAL VALVE               TRICUSPID VALVE MV Area (PHT): 3.79 cm    TR Peak grad:   18.0 mmHg MV Area VTI:   2.88 cm    TR Vmax:        212.00 cm/s MV Peak grad:  8.2 mmHg MV Mean grad:  2.0 mmHg    SHUNTS MV Vmax:       1.43 m/s    Systemic VTI:  0.19 m MV Vmean:      66.8 cm/s   Systemic Diam: 2.00 cm MV Decel Time: 200 msec MR Peak grad: 76.0 mmHg MR Mean grad: 44.0 mmHg MR Vmax:      436.00 cm/s MR Vmean:     308.0 cm/s MV E velocity: 94.30 cm/s Cherlynn Kaiser MD Electronically signed by Cherlynn Kaiser MD Signature Date/Time: 01/02/2023/2:57:54 PM    Final     Cardiac Studies   Echo 01/02/23 1. Left ventricular ejection fraction, by estimation, is 25 to 30%. The   left ventricle has severely decreased function. The left ventricle  demonstrates regional wall motion abnormalities (see scoring  diagram/findings for description). There is mild  asymmetric left ventricular hypertrophy of the infero-lateral segment.  Left ventricular diastolic parameters are indeterminate.   2. Right ventricular systolic function is mildly reduced. The right  ventricular size is normal. There is normal pulmonary artery systolic  pressure. The estimated right ventricular systolic pressure is Q000111Q mmHg.   3. Left atrial size was moderately dilated.   4. The mitral valve is grossly normal. Moderate to severe mitral valve  regurgitation with splay artifact, suggesting this may be underestimated.  No evidence of mitral stenosis.   5. The aortic valve is tricuspid. Aortic valve regurgitation is not  visualized. No aortic stenosis is present.   6. The inferior vena cava is normal in size with greater than 50%  respiratory variability, suggesting right atrial pressure of 3 mmHg.   Patient Profile     Nakia Golliher is a 67 y.o. male with a hx of cognitive impairment, schizophrenia residing in group home, tardive dyskinesia, constipation, SBO, anemia, tobacco abuse who is being seen 01/03/2023 for the evaluation of LV dysfunction at the request of Dr. Doristine Bosworth.   Assessment & Plan    Acute HFrEF Acute hypoxic respiratory failure  Respiratory failure multifactorial with CAP and new CHF. BNP elevated to 985.0. TTE with LVEF 25-30%, mild asymmetric left ventricular hypertrophy of the infero-lateral segment. The anterior septum, mid inferoseptal segment, and basal inferoseptal segment are akinetic. The apical septal segment is hypokinetic.   With IV diuresis, patient is now net neg 5.9. Appears to still have volume to lose, would continue IV diuresis today. Will add low dose Metoprolol 12.5mg  BID with elevated HR. Could consider adding MRA and ARB if BP continues improving.   Infection management per primary team.   Elevated troponin  Patient with elevated but flat troponin, 26->28->30. Most likely demand mediated. Though patient has  some WMA as noted above, continue to favor medical management at this time.  Moderate to severe MR  Could consider repeating TTE in limited fashion to assess once euvolemic.  PVCs  No significant PVC burden noted overnight/today on telemetry.   Transaminitis  Patient continues with progressing elevation of transaminases. No improvement yet with diuresis, but could be a component of hepatic congestion. Will defer additional workup to primary team.    Lab Results  Component Value Date   ALT 240 (H) 01/04/2023   AST 69 (H) 01/04/2023   ALKPHOS 187 (H) 01/04/2023   BILITOT 0.5 01/04/2023         For questions or updates, please contact [place] Please consult [email or web address] for contact info under        Signed, Lily Kocher, PA-C  01/04/2023, 2:03 PM    Personally seen and examined. Agree with above.  67 year old with schizophrenic disorder cognitive impairment tardive dyskinesia with LV dysfunction EF 25 to 30% on recent echocardiogram.  Patient is -6 L.  Continue with IV diuresis.  Tomorrow consider adding spironolactone/ARB.  Troponin mild flat elevation consistent with myocardial injury in the setting of heart failure.  Not ACS.  Moderate to severe mitral regurgitation is likely functional from his dilated left ventricle.  Hopefully this will improve as decompression improves and EF improves with goal-directed medical therapy.  Candee Furbish, MD

## 2023-01-04 NOTE — Plan of Care (Signed)
  Problem: Activity: Goal: Ability to tolerate increased activity will improve Outcome: Progressing   Problem: Clinical Measurements: Goal: Ability to maintain a body temperature in the normal range will improve Outcome: Progressing   Problem: Respiratory: Goal: Ability to maintain adequate ventilation will improve Outcome: Progressing Goal: Ability to maintain a clear airway will improve Outcome: Progressing   Problem: Education: Goal: Knowledge of General Education information will improve Description: Including pain rating scale, medication(s)/side effects and non-pharmacologic comfort measures Outcome: Progressing   Problem: Health Behavior/Discharge Planning: Goal: Ability to manage health-related needs will improve Outcome: Progressing   Problem: Clinical Measurements: Goal: Ability to maintain clinical measurements within normal limits will improve Outcome: Progressing Goal: Will remain free from infection Outcome: Progressing Goal: Diagnostic test results will improve Outcome: Progressing Goal: Respiratory complications will improve Outcome: Progressing Goal: Cardiovascular complication will be avoided Outcome: Progressing   Problem: Activity: Goal: Risk for activity intolerance will decrease Outcome: Progressing   Problem: Nutrition: Goal: Adequate nutrition will be maintained Outcome: Progressing   Problem: Coping: Goal: Level of anxiety will decrease Outcome: Progressing   Problem: Elimination: Goal: Will not experience complications related to bowel motility Outcome: Progressing Goal: Will not experience complications related to urinary retention Outcome: Progressing   Problem: Pain Managment: Goal: General experience of comfort will improve Outcome: Progressing   Problem: Safety: Goal: Ability to remain free from injury will improve Outcome: Progressing   Problem: Skin Integrity: Goal: Risk for impaired skin integrity will decrease Outcome:  Progressing   Problem: Safety: Goal: Non-violent Restraint(s) Outcome: Progressing   

## 2023-01-04 NOTE — Plan of Care (Signed)
?  Problem: Activity: ?Goal: Ability to tolerate increased activity will improve ?Outcome: Progressing ?  ?Problem: Clinical Measurements: ?Goal: Ability to maintain a body temperature in the normal range will improve ?Outcome: Progressing ?  ?Problem: Respiratory: ?Goal: Ability to maintain adequate ventilation will improve ?Outcome: Progressing ?Goal: Ability to maintain a clear airway will improve ?Outcome: Progressing ?  ?Problem: Education: ?Goal: Knowledge of General Education information will improve ?Description: Including pain rating scale, medication(s)/side effects and non-pharmacologic comfort measures ?Outcome: Progressing ?  ?Problem: Health Behavior/Discharge Planning: ?Goal: Ability to manage health-related needs will improve ?Outcome: Progressing ?  ?Problem: Clinical Measurements: ?Goal: Ability to maintain clinical measurements within normal limits will improve ?Outcome: Progressing ?  ?

## 2023-01-04 NOTE — Progress Notes (Signed)
PROGRESS NOTE    Jordan Kennedy  [id number] DOB: 1956/09/19 DOA: 01/01/2023 PCP: Haydee Salter, MD   Brief Narrative:  HPI: Jordan Kennedy is a 67 y.o. male with medical history significant of cognitive impairment, schizophrenia, tardive dyskinesia, chronic constipation, with prior history of SBO who presents with complaints of cough and shortness of breath.  History is obtained from the patient with assistance of his caregiver from the group home.  Symptoms initially started a week and a half ago with a mild nonproductive cough.  Reported associated symptoms of nasal congestion and weakness, but had not been having any fevers at that time.  He had been initially seen by his primary care provider on 3/5 for symptoms.  He was checked for COVID-19 which was negative at that time and was thought to have a viral upper respiratory infection which symptomatic treatment was recommended.  Over the last 2 days patient had been admitted to seem more short of breath walking up and down the steps.  His cough that seemed to become more productive.  Patient does have a history of smoking cigarettes.  At baseline he is normally ambulatory without need of assistance and is not on oxygen at baseline.   In the emergency department patient was noted to be febrile up to 101.4 F with tachycardia, tachypnea, and O2 saturation is as low as 84% on room air with improvement on nonrebreather.  Labs significant for WBC 20.5, hemoglobin 11.7, platelet 490, calcium 8.3, alkaline phosphatase 198, AST 59, and ALT 137.  Chest x-ray concerning for bilateral perihilar airspace disease right greater than left concerning for asymmetric pulmonary edema or diffuse infection.  Patient had initially been given given Rocephin, azithromycin, and Ativan 1 mg p.o.  Assessment & Plan:   Principal Problem:   Severe sepsis ([place]) Active Problems:   Acute respiratory failure with hypoxia (HCC)   Bronchitis due to tobacco use   Prolonged  QT interval   Normocytic anemia   Transaminitis   Intellectual disability   Schizophrenia (HCC)   Thrombocytosis   Chronic constipation   Acute exacerbation of congestive heart failure (HCC)   CAP (community acquired pneumonia)   Acute systolic (congestive) heart failure (HCC)  Acute respiratory failure with hypoxia and severe sepsis secondary to community-acquired pneumonia as well as acute systolic congestive heart failure: Noted to be febrile up to 101.4 F with tachycardia, tachypnea, and white blood cell count elevated 20.5 and acute hypoxic respiratory failure.  Saturations noted to be as low as 84% on room air and required nonrebreather but only requiring 4 L of oxygen and maintaining saturation around 92%. chest x-ray noted bilateral perihilar airspace disease right greater than left with new interval opacities concerning for pulmonary edema or diffuse infection.  Procalcitonin 0.37 but BNP significantly elevated at 985 indicates that he has both acute on chronic congestive heart failure and possibly bacterial pneumonia but more so CHF.  Echo shows 25 to 30% ejection fraction which appears to be new.  Troponin slightly elevated but flat indicating demand ischemia.  Seen by cardiology, given his inability to follow directions, they decided to pursue guideline medical therapy and follow-up patient clinically with no intervention planned and to repeat echo as outpatient down the road in couple of weeks or months.  Patient is improving, for the first time today, he denied having any shortness of breath.  He appears clinically comfortable as well.  Lungs are improving.  He is now requiring only 2 L of oxygen saturating 97%.  Continue to wean oxygen.  Will continue Rocephin, Zithromax and prednisone until tomorrow.  Repeat procalcitonin is pending.   Possible bronchitis due to tobacco use: Patient was noted to be wheezing on physical exam at the time of admission.  Patient does have a history of  smoking tobacco.  No formal pulmonary function test on file.  He had been given Decadron IV in the emergency department.  He was started on prednisone.  I will continue that until tomorrow.   Prolonged QT interval Acute.  QTc was noted to be prolonged at 532, but patient was noted to be tachycardic at that time. -Avoid QT prolonging medications.  Will repeat EKG in the morning.   Normocytic anemia Hemoglobin stable.   Transaminitis/elevated LFTs Acute.  On admission alkaline phosphatase 198, AST 59, and ALT 137.  Viral hepatitis panel negative.  LFTs improving.  Intellectual disability Patient currently lives at a group home due to cognitive impairment. Continue sitter   Schizophrenia -Clozapine deferred  due to prolonged QT interval   Thrombocytosis Acute.  Platelet count elevated at 490.  Thought reactive secondary to above.  Improving.   Chronic constipation -Continue senna-docusate, Linzess   Tobacco abuse Patient still reports smoking cigarettes. -Nicotine patch offered  DVT prophylaxis: enoxaparin (LOVENOX) injection 40 mg Start: 01/01/23 1600   Code Status: Full Code  Family Communication:  None present at bedside.   Status is: Inpatient Remains inpatient appropriate because: Patient still hypoxic and requires continued hospitalization.  Patient will likely be ready for discharge in 1 to 2 days.   Estimated body mass index is 26.19 kg/m as calculated from the following:   Height as of this encounter: 6\' 2"  (1.88 m).   Weight as of this encounter: 92.5 kg.    Nutritional Assessment: Body mass index is 26.19 kg/m.Marland Kitchen Seen by dietician.  I agree with the assessment and plan as outlined below: Nutrition Status:        . Skin Assessment: I have examined the patient's skin and I agree with the wound assessment as performed by the wound care RN as outlined below:    Consultants:  None  Procedures:  None  Antimicrobials:  Anti-infectives (From admission,  onward)    Start     Dose/Rate Route Frequency Ordered Stop   01/02/23 1000  cefTRIAXone (ROCEPHIN) 2 g in sodium chloride 0.9 % 100 mL IVPB        2 g 200 mL/hr over 30 Minutes Intravenous Every 24 hours 01/01/23 1458 01/06/23 0959   01/02/23 1000  doxycycline (VIBRAMYCIN) 100 mg in sodium chloride 0.9 % 250 mL IVPB        100 mg 125 mL/hr over 120 Minutes Intravenous Every 12 hours 01/01/23 1458     01/01/23 1200  cefTRIAXone (ROCEPHIN) 2 g in sodium chloride 0.9 % 100 mL IVPB        2 g 200 mL/hr over 30 Minutes Intravenous  Once 01/01/23 1159 01/01/23 1350   01/01/23 1200  azithromycin (ZITHROMAX) 500 mg in sodium chloride 0.9 % 250 mL IVPB        500 mg 250 mL/hr over 60 Minutes Intravenous  Once 01/01/23 1159 01/01/23 1523         Subjective:  Patient seen and examined.  He is alert and oriented to person and place.  He denies any complaint or any shortness of breath.  Objective: Vitals:   01/03/23 2111 01/04/23 0056 01/04/23 0400 01/04/23 0808  BP: 115/83 (!) 142/105 (!) 118/91 Marland Kitchen)  132/104  Pulse:  96 98 (!) 103  Resp: 18 18 (!) 25 (!) 25  Temp: 98.4 F (36.9 C) 97.6 F (36.4 C) 97.8 F (36.6 C) 98.1 F (36.7 C)  TempSrc: Oral Axillary Oral Axillary  SpO2: 91%  96% 99%  Weight:      Height:        Intake/Output Summary (Last 24 hours) at 01/04/2023 1005 Last data filed at 01/04/2023 0510 Gross per 24 hour  Intake --  Output 4200 ml  Net -4200 ml    Filed Weights   01/01/23 1048  Weight: 92.5 kg    Examination:  General exam: Appears calm and comfortable  Respiratory system: Basilar rhonchi but improved. Respiratory effort normal. Cardiovascular system: S1 & S2 heard, RRR. No JVD, murmurs, rubs, gallops or clicks. No pedal edema. Gastrointestinal system: Abdomen is nondistended, soft and nontender. No organomegaly or masses felt. Normal bowel sounds heard. Central nervous system: Alert and oriented. No focal neurological deficits. Extremities:  Symmetric 5 x 5 power. Skin: No rashes, lesions or ulcers.   Data Reviewed: I have personally reviewed following labs and imaging studies  CBC: Recent Labs  Lab 01/01/23 1055 01/01/23 1716 01/02/23 0434 01/03/23 0221  WBC 20.5*  --  19.3* 21.2*  NEUTROABS 17.5*  --   --  17.2*  HGB 11.7* 11.2* 11.7* 12.5*  HCT 36.0* 33.0* 35.9* 38.0*  MCV 97.6  --  95.2 94.1  PLT 490*  --  472* 409*    Basic Metabolic Panel: Recent Labs  Lab 01/01/23 1055 01/01/23 1716 01/02/23 0434 01/03/23 0221  NA 141 143 139 137  K 3.7 3.8 4.2 4.1  CL 110  --  104 104  CO2 21*  --  23 23  GLUCOSE 156*  --  165* 169*  BUN 13  --  13 23  CREATININE 1.02  --  1.01 1.10  CALCIUM 8.3*  --  8.6* 8.5*  MG  --   --  2.3 2.1    GFR: Estimated Creatinine Clearance: 78.9 mL/min (by C-G formula based on SCr of 1.1 mg/dL). Liver Function Tests: Recent Labs  Lab 01/01/23 1055 01/02/23 0434 01/03/23 0221  AST 59* 49* 91*  ALT 137* 131* 143*  ALKPHOS 198* 189* 172*  BILITOT 0.5 0.5 0.7  PROT 5.7* 5.9* 5.6*  ALBUMIN 2.7* 2.4* 2.2*    No results for input(s): "LIPASE", "AMYLASE" in the last 168 hours. No results for input(s): "AMMONIA" in the last 168 hours. Coagulation Profile: No results for input(s): "INR", "PROTIME" in the last 168 hours. Cardiac Enzymes: No results for input(s): "CKTOTAL", "CKMB", "CKMBINDEX", "TROPONINI" in the last 168 hours. BNP (last 3 results) No results for input(s): "PROBNP" in the last 8760 hours. HbA1C: No results for input(s): "HGBA1C" in the last 72 hours. CBG: Recent Labs  Lab 01/01/23 1622  GLUCAP 134*    Lipid Profile: No results for input(s): "CHOL", "HDL", "LDLCALC", "TRIG", "CHOLHDL", "LDLDIRECT" in the last 72 hours. Thyroid Function Tests: No results for input(s): "TSH", "T4TOTAL", "FREET4", "T3FREE", "THYROIDAB" in the last 72 hours. Anemia Panel: No results for input(s): "VITAMINB12", "FOLATE", "FERRITIN", "TIBC", "IRON", "RETICCTPCT" in the last  72 hours. Sepsis Labs: Recent Labs  Lab 01/01/23 1620 01/01/23 1630  PROCALCITON 0.37  --   LATICACIDVEN  --  1.4     Recent Results (from the past 240 hour(s))  Resp panel by RT-PCR (RSV, Flu A&B, Covid) Anterior Nasal Swab     Status: None   Collection Time:  01/01/23 10:54 AM   Specimen: Anterior Nasal Swab  Result Value Ref Range Status   SARS Coronavirus 2 by RT PCR NEGATIVE NEGATIVE Final   Influenza A by PCR NEGATIVE NEGATIVE Final   Influenza B by PCR NEGATIVE NEGATIVE Final    Comment: (NOTE) The Xpert Xpress SARS-CoV-2/FLU/RSV plus assay is intended as an aid in the diagnosis of influenza from Nasopharyngeal swab specimens and should not be used as a sole basis for treatment. Nasal washings and aspirates are unacceptable for Xpert Xpress SARS-CoV-2/FLU/RSV testing.  Fact Sheet for Patients: EntrepreneurPulse.com.au  Fact Sheet for Healthcare Providers: IncredibleEmployment.be  This test is not yet approved or cleared by the [place] FDA and has been authorized for detection and/or diagnosis of SARS-CoV-2 by FDA under an Emergency Use Authorization (EUA). This EUA will remain in effect (meaning this test can be used) for the duration of the COVID-19 declaration under Section 564(b)(1) of the Act, 21 U.S.C. section 360bbb-3(b)(1), unless the authorization is terminated or revoked.     Resp Syncytial Virus by PCR NEGATIVE NEGATIVE Final    Comment: (NOTE) Fact Sheet for Patients: EntrepreneurPulse.com.au  Fact Sheet for Healthcare Providers: IncredibleEmployment.be  This test is not yet approved or cleared by the [place] FDA and has been authorized for detection and/or diagnosis of SARS-CoV-2 by FDA under an Emergency Use Authorization (EUA). This EUA will remain in effect (meaning this test can be used) for the duration of the COVID-19 declaration under Section 564(b)(1) of the  Act, 21 U.S.C. section 360bbb-3(b)(1), unless the authorization is terminated or revoked.  Performed at [facility], [place] [address] [place], [place] [address]   Respiratory (~20 pathogens) panel by PCR     Status: None   Collection Time: 01/01/23 11:21 AM   Specimen: Nasopharyngeal Swab; Respiratory  Result Value Ref Range Status   Adenovirus NOT DETECTED NOT DETECTED Final   Coronavirus 229E NOT DETECTED NOT DETECTED Final    Comment: (NOTE) The Coronavirus on the Respiratory Panel, DOES NOT test for the novel  Coronavirus (2019 nCoV)    Coronavirus HKU1 NOT DETECTED NOT DETECTED Final   Coronavirus NL63 NOT DETECTED NOT DETECTED Final   Coronavirus OC43 NOT DETECTED NOT DETECTED Final   Metapneumovirus NOT DETECTED NOT DETECTED Final   Rhinovirus / Enterovirus NOT DETECTED NOT DETECTED Final   Influenza A NOT DETECTED NOT DETECTED Final   Influenza B NOT DETECTED NOT DETECTED Final   Parainfluenza Virus 1 NOT DETECTED NOT DETECTED Final   Parainfluenza Virus 2 NOT DETECTED NOT DETECTED Final   Parainfluenza Virus 3 NOT DETECTED NOT DETECTED Final   Parainfluenza Virus 4 NOT DETECTED NOT DETECTED Final   Respiratory Syncytial Virus NOT DETECTED NOT DETECTED Final   Bordetella pertussis NOT DETECTED NOT DETECTED Final   Bordetella Parapertussis NOT DETECTED NOT DETECTED Final   Chlamydophila pneumoniae NOT DETECTED NOT DETECTED Final   Mycoplasma pneumoniae NOT DETECTED NOT DETECTED Final    Comment: Performed at [facility], [place]. [address], [place], [place] [address]  Culture, blood (Routine X 2) w Reflex to ID Panel     Status: None (Preliminary result)   Collection Time: 01/01/23  4:43 PM   Specimen: BLOOD  Result Value Ref Range Status   Specimen Description BLOOD RIGHT ANTECUBITAL  Final   Special Requests   Final    BOTTLES DRAWN AEROBIC AND ANAEROBIC Blood Culture adequate volume   Culture   Final    NO GROWTH 2 DAYS  Performed at [place], [place] [address] [place], [place] [address]    Report Status PENDING  Incomplete  Culture, blood (Routine X 2) w Reflex to ID Panel     Status: None (Preliminary result)   Collection Time: 01/01/23  8:55 PM   Specimen: BLOOD  Result Value Ref Range Status   Specimen Description BLOOD LEFT ANTECUBITAL  Final   Special Requests Blood Culture adequate volume  Final   Culture   Final    NO GROWTH 2 DAYS Performed at [facility], [place] [address], [place], [place] [address]    Report Status PENDING  Incomplete     Radiology Studies: DG CHEST PORT 1 VIEW  Result Date: 01/03/2023 CLINICAL DATA:  CHF EXAM: PORTABLE CHEST 1 VIEW COMPARISON:  Radiograph 01/01/2023 FINDINGS: Unchanged cardiomediastinal silhouette. There is persistent bilateral airspace disease, decreased in the right lung in comparison to prior. No large effusion or evidence of pneumothorax. Bones are unchanged. IMPRESSION: Persistent bilateral airspace disease, decreased on the right in comparison prior exam, compatible with edema or infection. Electronically Signed   By: Maurine Simmering M.D.   On: 01/03/2023 11:54   ECHOCARDIOGRAM COMPLETE  Result Date: 01/02/2023    ECHOCARDIOGRAM REPORT   Patient Name:   JAMUAL BASS Date of Exam: 01/02/2023 Medical Rec #:  [id number]      Height:       74.0 in Accession #:    [id number]     Weight:       204.0 lb Date of Birth:  [birth year]      BSA:          2.192 m Patient Age:    89 years       BP:           118/83 mmHg Patient Gender: M              HR:           101 bpm. Exam Location:  Inpatient Procedure: 2D Echo, Cardiac Doppler, Color Doppler and Intracardiac            Opacification Agent Indications:    CHF  History:        Patient has no prior history of Echocardiogram examinations.                 CHF; Risk Factors:Current Smoker.  Sonographer:    Wenda Low Referring Phys: [id number] RONDELL A SMITH IMPRESSIONS  1. Left ventricular ejection fraction, by estimation, is 25 to 30%. The  left ventricle has severely decreased function. The left ventricle demonstrates regional wall motion abnormalities (see scoring diagram/findings for description). There is mild asymmetric left ventricular hypertrophy of the infero-lateral segment. Left ventricular diastolic parameters are indeterminate.  2. Right ventricular systolic function is mildly reduced. The right ventricular size is normal. There is normal pulmonary artery systolic pressure. The estimated right ventricular systolic pressure is Q000111Q mmHg.  3. Left atrial size was moderately dilated.  4. The mitral valve is grossly normal. Moderate to severe mitral valve regurgitation with splay artifact, suggesting this may be underestimated. No evidence of mitral stenosis.  5. The aortic valve is tricuspid. Aortic valve regurgitation is not visualized. No aortic stenosis is present.  6. The inferior vena cava is normal in size with greater than 50% respiratory variability, suggesting right atrial pressure of 3 mmHg. FINDINGS  Left Ventricle: Left ventricular ejection fraction, by estimation, is 25 to 30%. The left ventricle has severely decreased  function. The left ventricle demonstrates regional wall motion abnormalities. Definity contrast agent was given IV to delineate the left ventricular endocardial borders. The left ventricular internal cavity size was normal in size. There is mild asymmetric left ventricular hypertrophy of the infero-lateral segment. Left ventricular diastolic parameters are indeterminate.  LV Wall Scoring: The anterior septum, mid inferoseptal segment, and basal inferoseptal segment are akinetic. The apical septal segment is hypokinetic. Right Ventricle: The right ventricular size is normal. No increase in right ventricular wall thickness. Right ventricular systolic function is mildly reduced. There is normal pulmonary artery systolic pressure. The tricuspid regurgitant velocity is 2.12 m/s, and with an assumed right atrial pressure  of 3 mmHg, the estimated right ventricular systolic pressure is Q000111Q mmHg. Left Atrium: Left atrial size was moderately dilated. Right Atrium: Right atrial size was normal in size. Pericardium: There is no evidence of pericardial effusion. Mitral Valve: The mitral valve is grossly normal. Moderate to severe mitral valve regurgitation. No evidence of mitral valve stenosis. MV peak gradient, 8.2 mmHg. The mean mitral valve gradient is 2.0 mmHg. Tricuspid Valve: The tricuspid valve is normal in structure. Tricuspid valve regurgitation is trivial. No evidence of tricuspid stenosis. Aortic Valve: The aortic valve is tricuspid. Aortic valve regurgitation is not visualized. No aortic stenosis is present. Aortic valve mean gradient measures 4.0 mmHg. Aortic valve peak gradient measures 7.7 mmHg. Aortic valve area, by VTI measures 2.22 cm. Pulmonic Valve: The pulmonic valve was normal in structure. Pulmonic valve regurgitation is trivial. No evidence of pulmonic stenosis. Aorta: The aortic root is normal in size and structure. Venous: The inferior vena cava is normal in size with greater than 50% respiratory variability, suggesting right atrial pressure of 3 mmHg. IAS/Shunts: No atrial level shunt detected by color flow Doppler.  LEFT VENTRICLE PLAX 2D LVIDd:         6.00 cm LVIDs:         5.40 cm LV PW:         1.20 cm LV IVS:        0.90 cm LVOT diam:     2.00 cm LV SV:         59 LV SV Index:   27 LVOT Area:     3.14 cm  RIGHT VENTRICLE RV Basal diam:  4.90 cm TAPSE (M-mode): 2.2 cm LEFT ATRIUM              Index        RIGHT ATRIUM           Index LA diam:        4.70 cm  2.14 cm/m   RA Area:     18.80 cm LA Vol (A2C):   85.5 ml  39.01 ml/m  RA Volume:   58.50 ml  26.69 ml/m LA Vol (A4C):   99.5 ml  45.40 ml/m LA Biplane Vol: 102.0 ml 46.54 ml/m  AORTIC VALVE                    PULMONIC VALVE AV Area (Vmax):    2.10 cm     PV Vmax:       0.93 m/s AV Area (Vmean):   1.95 cm     PV Peak grad:  3.4 mmHg AV Area  (VTI):     2.22 cm AV Vmax:           139.00 cm/s AV Vmean:          95.200 cm/s AV VTI:  0.267 m AV Peak Grad:      7.7 mmHg AV Mean Grad:      4.0 mmHg LVOT Vmax:         93.10 cm/s LVOT Vmean:        59.200 cm/s LVOT VTI:          0.189 m LVOT/AV VTI ratio: 0.71  AORTA Ao Root diam: 3.40 cm MITRAL VALVE               TRICUSPID VALVE MV Area (PHT): 3.79 cm    TR Peak grad:   18.0 mmHg MV Area VTI:   2.88 cm    TR Vmax:        212.00 cm/s MV Peak grad:  8.2 mmHg MV Mean grad:  2.0 mmHg    SHUNTS MV Vmax:       1.43 m/s    Systemic VTI:  0.19 m MV Vmean:      66.8 cm/s   Systemic Diam: 2.00 cm MV Decel Time: 200 msec MR Peak grad: 76.0 mmHg MR Mean grad: 44.0 mmHg MR Vmax:      436.00 cm/s MR Vmean:     308.0 cm/s MV E velocity: 94.30 cm/s Cherlynn Kaiser MD Electronically signed by Cherlynn Kaiser MD Signature Date/Time: 01/02/2023/2:57:54 PM    Final     Scheduled Meds:  amantadine  100 mg Oral QHS   aspirin EC  81 mg Oral Daily   clotrimazole   Topical BID   cloZAPine  200 mg Oral BID   enoxaparin (LOVENOX) injection  40 mg Subcutaneous Q24H   furosemide  40 mg Intravenous BID   guaiFENesin  600 mg Oral BID   levalbuterol  0.63 mg Nebulization TID   linaclotide  290 mcg Oral QAC breakfast   melatonin  10 mg Oral QHS   metoCLOPramide  5 mg Oral BID   mirabegron ER  50 mg Oral Daily   nicotine  14 mg Transdermal Daily   pantoprazole  40 mg Oral QPM   PARoxetine  40 mg Oral QPM   predniSONE  40 mg Oral Q breakfast   senna-docusate  1 tablet Oral BID   sodium chloride flush  3 mL Intravenous Q12H   tamsulosin  0.4 mg Oral Daily   zolpidem  10 mg Oral QHS   Continuous Infusions:  cefTRIAXone (ROCEPHIN)  IV 2 g (01/04/23 0949)   doxycycline (VIBRAMYCIN) IV 100 mg (01/03/23 2148)     LOS: 3 days   Darliss Cheney, MD [facility]  01/04/2023, 10:05 AM   *Please note that this is a verbal dictation therefore any spelling or grammatical errors are due to the "[employer]" system interpretation.  Please page via [place] and do not message via secure chat for urgent patient care matters. Secure chat can be used for non urgent patient care matters.  How to contact the [facility] [place] Attending or Consulting provider [place] or covering provider during after hours [place], for this patient?  Check the care team in [facility] and look for a) attending/consulting [facility] provider listed and b) the [facility] team listed. Page or secure chat [phone]. Log into [email or web address] and use [place]'s universal password to access. If you do not have the password, please contact the hospital operator. Locate the [facility] provider you are looking for under [facility] and page to a number that you can be directly reached. If you still have difficulty reaching the provider, please page the [facility] (Director  on Call) for the Hospitalists listed on amion for assistance.

## 2023-01-04 NOTE — Progress Notes (Signed)
Pt on 2L [place] and stable. Pt refusing BIPAP QHS will continue to monitor

## 2023-01-04 NOTE — TOC Initial Note (Signed)
Transition of Care [facility]) - Initial/Assessment Note    Patient Details  Name: Jordan Kennedy MRN: [id number] Date of Birth: 22-Jan-1956  Transition of Care [facility]) CM/SW Contact:    Jordan Kennedy, [place] Phone Number: 01/04/2023, 3:28 PM  Clinical Narrative:                 CSW spoke with patient's [place], [place] [phone]. She confirmed patient resides at a group home with Jordan Kennedy [phone]).  CSW spoke with Jordan Kennedy. He reported patient is his most independent resident. He stated at discharge he will just need DC Summary printed (no Fl2 needed) and to call him for transportation home. CSW inquired if they can accommodate oxygen if patient requires it and he stated he would have to check into it if that is the case. Hopeful for patient to wean off of it. Address is correct on facesheet. Will continue to follow.    Expected Discharge Plan: Group Home Barriers to Discharge: Continued Medical Work up   Patient Goals and CMS Choice Patient states their goals for this hospitalization and ongoing recovery are:: Recovery CMS Medicare.gov Compare Post Acute Care list provided to:: Patient Represenative (must comment)        Expected Discharge Plan and Services In-house Referral: Clinical Social Work     Living arrangements for the past 2 months: Group Home                                      Prior Living Arrangements/Services Living arrangements for the past 2 months: [place] Lives with:: Facility Resident Patient language and need for interpreter reviewed:: Yes Do you feel safe going back to the place where you live?: Yes      Need for Family Participation in Patient Care: Yes (Comment) Care giver support system in place?: Yes (comment)   Criminal Activity/Legal Involvement Pertinent to Current Situation/Hospitalization: No - Comment as needed  Activities of Daily Living Home Assistive Devices/Equipment: None ADL Screening (condition at time of  admission) Patient's cognitive ability adequate to safely complete daily activities?: No Is the patient deaf or have difficulty hearing?: No Does the patient have difficulty seeing, even when wearing glasses/contacts?: No Does the patient have difficulty concentrating, remembering, or making decisions?: Yes Patient able to express need for assistance with ADLs?: Yes Does the patient have difficulty dressing or bathing?: No Independently performs ADLs?: Yes (appropriate for developmental age) Does the patient have difficulty walking or climbing stairs?: No Weakness of Legs: Both Weakness of Arms/Hands: None  Permission Sought/Granted Permission sought to share information with : Facility Sport and exercise psychologist, Family Supports Permission granted to share information with : No  Share Information with NAME: Jordan Kennedy  Permission granted to share info w AGENCY: Group Home/DSS  Permission granted to share info w Relationship: DSS Guardian  Permission granted to share info w Contact Information: [phone]  Emotional Assessment Appearance:: Appears stated age Attitude/Demeanor/Rapport: Unable to Assess Affect (typically observed): Unable to Assess Orientation: : Oriented to Self, Oriented to Place Alcohol / Substance Use: Not Applicable Psych Involvement: No (comment)  Admission diagnosis:  Hypoxia [R09.02] Acute respiratory failure with hypoxia ([place]) [J96.01] CAP (community acquired pneumonia) due to Mycoplasma pneumoniae [J15.7] Community acquired pneumonia, unspecified laterality [J18.9] Patient Active Problem List   Diagnosis Date Noted   Acute systolic (congestive) heart failure ([place]) 01/03/2023   CAP (community acquired pneumonia) 01/02/2023   Severe sepsis ([place])  01/01/2023   Acute respiratory failure with hypoxia ([facility]) 01/01/2023   Prolonged QT interval 01/01/2023   Normocytic anemia 01/01/2023   Bronchitis due to tobacco use 01/01/2023   Transaminitis 01/01/2023   Chronic  constipation 01/01/2023   Thrombocytosis 01/01/2023   Acute exacerbation of congestive heart failure ([place]) 01/01/2023   Tardive dyskinesia 11/20/2021   Lower urinary tract symptoms (LUTS) 05/02/2021   History of small bowel obstruction 01/31/2021   History of colon polyps 01/31/2021   Onychomycosis 01/31/2021   Gastroesophageal reflux disease 01/31/2021   Constipation due to slow transit 04/25/2012   Anxiety and depression 04/25/2012   Edentulous 04/25/2012   Schizophrenia ([place])    Intellectual disability    PCP:  Jordan Salter, MD Pharmacy:   [place], [place] - [address] [place] [address] Phone: [phone] Fax: [phone]  [facility] [address] [place] [address] Phone: [phone] Fax: [phone]     Social Determinants of Health (SDOH) Social History: SDOH Screenings   Food Insecurity: No Food Insecurity (10/16/2022)  Housing: Low Risk  (10/11/2021)  Transportation Needs: No Transportation Needs (10/16/2022)  Alcohol Screen: Low Risk  (10/11/2021)  Depression (PHQ2-9): Low Risk  (10/16/2022)  Financial Resource Strain: Low Risk  (10/16/2022)  Physical Activity: Inactive (10/16/2022)  Social Connections: Socially Isolated (10/11/2021)  Stress: No Stress Concern Present (10/16/2022)  Tobacco Use: High Risk (01/01/2023)   SDOH Interventions:     Readmission Risk Interventions     No data to display

## 2023-01-05 DIAGNOSIS — I5021 Acute systolic (congestive) heart failure: Secondary | ICD-10-CM

## 2023-01-05 DIAGNOSIS — R6520 Severe sepsis without septic shock: Secondary | ICD-10-CM | POA: Diagnosis not present

## 2023-01-05 DIAGNOSIS — A419 Sepsis, unspecified organism: Secondary | ICD-10-CM | POA: Diagnosis not present

## 2023-01-05 LAB — COMPREHENSIVE METABOLIC PANEL
ALT: 200 U/L — ABNORMAL HIGH (ref 0–44)
AST: 52 U/L — ABNORMAL HIGH (ref 15–41)
Albumin: 2.2 g/dL — ABNORMAL LOW (ref 3.5–5.0)
Alkaline Phosphatase: 177 U/L — ABNORMAL HIGH (ref 38–126)
Anion gap: 9 (ref 5–15)
BUN: 15 mg/dL (ref 8–23)
CO2: 27 mmol/L (ref 22–32)
Calcium: 8.7 mg/dL — ABNORMAL LOW (ref 8.9–10.3)
Chloride: 103 mmol/L (ref 98–111)
Creatinine, Ser: 0.83 mg/dL (ref 0.61–1.24)
GFR, Estimated: >60 mL/min (ref >60)
Glucose, Bld: 128 mg/dL — ABNORMAL HIGH (ref 70–99)
Potassium: 4.2 mmol/L (ref 3.5–5.1)
Sodium: 139 mmol/L (ref 135–145)
Total Bilirubin: 0.5 mg/dL (ref 0.3–1.2)
Total Protein: 5.7 g/dL — ABNORMAL LOW (ref 6.5–8.1)

## 2023-01-05 LAB — CBC WITH DIFFERENTIAL/PLATELET
Abs Immature Granulocytes: 0.21 10*3/uL — ABNORMAL HIGH (ref 0.00–0.07)
Basophils Absolute: 0 10*3/uL (ref 0.0–0.1)
Basophils Relative: 0 %
Eosinophils Absolute: 0.1 10*3/uL (ref 0.0–0.5)
Eosinophils Relative: 1 %
HCT: 38 % — ABNORMAL LOW (ref 39.0–52.0)
Hemoglobin: 12.6 g/dL — ABNORMAL LOW (ref 13.0–17.0)
Immature Granulocytes: 1 %
Lymphocytes Relative: 12 %
Lymphs Abs: 1.9 10*3/uL (ref 0.7–4.0)
MCH: 31.3 pg (ref 26.0–34.0)
MCHC: 33.2 g/dL (ref 30.0–36.0)
MCV: 94.5 fL (ref 80.0–100.0)
Monocytes Absolute: 1.5 10*3/uL — ABNORMAL HIGH (ref 0.1–1.0)
Monocytes Relative: 9 %
Neutro Abs: 12.6 10*3/uL — ABNORMAL HIGH (ref 1.7–7.7)
Neutrophils Relative %: 77 %
Platelets: 544 10*3/uL — ABNORMAL HIGH (ref 150–400)
RBC: 4.02 MIL/uL — ABNORMAL LOW (ref 4.22–5.81)
RDW: 15.9 % — ABNORMAL HIGH (ref 11.5–15.5)
WBC: 16.3 10*3/uL — ABNORMAL HIGH (ref 4.0–10.5)
nRBC: 0 % (ref 0.0–0.2)

## 2023-01-05 MED ORDER — DAPAGLIFLOZIN PROPANEDIOL 10 MG PO TABS
10.0000 mg | ORAL_TABLET | Freq: Every day | ORAL | Status: DC
  Administered 2023-01-05 – 2023-01-07 (×3): 10 mg via ORAL
  Filled 2023-01-05 (×3): qty 1

## 2023-01-05 MED ORDER — SACUBITRIL-VALSARTAN 24-26 MG PO TABS
1.0000 | ORAL_TABLET | Freq: Two times a day (BID) | ORAL | Status: DC
  Administered 2023-01-05 – 2023-01-07 (×5): 1 via ORAL
  Filled 2023-01-05 (×5): qty 1

## 2023-01-05 MED ORDER — ORAL CARE MOUTH RINSE: 15.0000 mL | OROMUCOSAL | Status: DC | PRN

## 2023-01-05 MED ORDER — LEVALBUTEROL HCL 0.63 MG/3ML IN NEBU
0.6300 mg | INHALATION_SOLUTION | Freq: Two times a day (BID) | RESPIRATORY_TRACT | Status: DC
  Administered 2023-01-05 – 2023-01-07 (×4): 0.63 mg via RESPIRATORY_TRACT
  Filled 2023-01-05 (×4): qty 3

## 2023-01-05 MED ORDER — SPIRONOLACTONE 12.5 MG HALF TABLET
12.5000 mg | ORAL_TABLET | Freq: Every day | ORAL | Status: DC
  Administered 2023-01-05 – 2023-01-07 (×3): 12.5 mg via ORAL
  Filled 2023-01-05 (×3): qty 1

## 2023-01-05 NOTE — Progress Notes (Signed)
Pt on room air and stable. Pt refusing BIPAP QHS. Will continue to monitor

## 2023-01-05 NOTE — Progress Notes (Signed)
Rounding Note    Patient Name: Jordan Kennedy Date of Encounter: 01/05/2023  [place] Cardiologist: Berniece Salines, DO   Subjective  Patient states not feeling well, not elaborating  Inpatient Medications    Scheduled Meds:  amantadine  100 mg Oral QHS   aspirin EC  81 mg Oral Daily   clotrimazole   Topical BID   cloZAPine  200 mg Oral BID   enoxaparin (LOVENOX) injection  40 mg Subcutaneous Q24H   furosemide  40 mg Intravenous BID   guaiFENesin  600 mg Oral BID   levalbuterol  0.63 mg Nebulization TID   linaclotide  290 mcg Oral QAC breakfast   melatonin  10 mg Oral QHS   metoCLOPramide  5 mg Oral BID   metoprolol tartrate  12.5 mg Oral BID   mirabegron ER  50 mg Oral Daily   nicotine  14 mg Transdermal Daily   pantoprazole  40 mg Oral QPM   PARoxetine  40 mg Oral QPM   senna-docusate  1 tablet Oral BID   sodium chloride flush  3 mL Intravenous Q12H   tamsulosin  0.4 mg Oral Daily   zolpidem  10 mg Oral QHS   Continuous Infusions:  doxycycline (VIBRAMYCIN) IV 100 mg (01/05/23 0948)   PRN Meds: acetaminophen **OR** acetaminophen, albuterol, trimethobenzamide   Vital Signs    Vitals:   01/05/23 0330 01/05/23 0750 01/05/23 0816 01/05/23 0820  BP: 112/89  122/82   Pulse: 91  (!) 105 (!) 104  Resp: (!) 23  (!) 21 18  Temp: 98.4 F (36.9 C)  98.8 F (37.1 C)   TempSrc: Oral  Oral   SpO2: 90% 97% 93% 94%  Weight:      Height:        Intake/Output Summary (Last 24 hours) at 01/05/2023 1027 Last data filed at 01/05/2023 0944 Gross per 24 hour  Intake --  Output 2850 ml  Net -2850 ml      01/01/2023   10:48 AM 12/25/2022    3:54 PM 10/16/2022    4:11 PM  Last 3 Weights  Weight (lbs) 204 lb 204 lb 198 lb  Weight (kg) 92.534 kg 92.534 kg 89.812 kg      Telemetry    Sinus rhythm - Personally Reviewed  ECG    No new - Personally Reviewed  Physical Exam   GEN: No acute distress.   Neck: No JVD Cardiac: RRR, no murmurs, rubs, or gallops.   Respiratory: Clear to auscultation bilaterally. GI: Soft, nontender, non-distended  MS: No edema; No deformity. Neuro:  Nonfocal  Psych: Normal affect   Labs    High Sensitivity Troponin:   Recent Labs  Lab 01/03/23 0838 01/03/23 1212 01/03/23 1604  TROPONINIHS 30* 28* 26*     Chemistry Recent Labs  Lab 01/02/23 0434 01/03/23 0221 01/04/23 1113 01/05/23 0321  NA 139 137 140 139  K 4.2 4.1 3.9 4.2  CL 104 104 104 103  CO2 23 23 25 27   GLUCOSE 165* 169* 116* 128*  BUN 13 23 15 15   CREATININE 1.01 1.10 0.83 0.83  CALCIUM 8.6* 8.5* 8.9 8.7*  MG 2.3 2.1  --   --   PROT 5.9* 5.6* 6.3* 5.7*  ALBUMIN 2.4* 2.2* 2.5* 2.2*  AST 49* 91* 69* 52*  ALT 131* 143* 240* 200*  ALKPHOS 189* 172* 187* 177*  BILITOT 0.5 0.7 0.5 0.5  GFRNONAA >60 >60 >60 >60  ANIONGAP 12 10 11  9  Lipids No results for input(s): "CHOL", "TRIG", "HDL", "LABVLDL", "LDLCALC", "CHOLHDL" in the last 168 hours.  Hematology Recent Labs  Lab 01/03/23 0221 01/04/23 1113 01/05/23 0321  WBC 21.2* 19.2* 16.3*  RBC 4.04* 4.38 4.02*  HGB 12.5* 13.2 12.6*  HCT 38.0* 41.7 38.0*  MCV 94.1 95.2 94.5  MCH 30.9 30.1 31.3  MCHC 32.9 31.7 33.2  RDW 16.0* 16.0* 15.9*  PLT 409* 563* 544*   Thyroid  Recent Labs  Lab 01/04/23 1113  TSH 0.850    BNP Recent Labs  Lab 01/01/23 1620  BNP 985.0*    DDimer No results for input(s): "DDIMER" in the last 168 hours.   Radiology    DG CHEST PORT 1 VIEW  Result Date: 01/03/2023 CLINICAL DATA:  CHF EXAM: PORTABLE CHEST 1 VIEW COMPARISON:  Radiograph 01/01/2023 FINDINGS: Unchanged cardiomediastinal silhouette. There is persistent bilateral airspace disease, decreased in the right lung in comparison to prior. No large effusion or evidence of pneumothorax. Bones are unchanged. IMPRESSION: Persistent bilateral airspace disease, decreased on the right in comparison prior exam, compatible with edema or infection. Electronically Signed   By: Maurine Simmering M.D.   On:  01/03/2023 11:54    Cardiac Studies   Echo 01/02/23 1. Left ventricular ejection fraction, by estimation, is 25 to 30%. The  left ventricle has severely decreased function. The left ventricle  demonstrates regional wall motion abnormalities (see scoring  diagram/findings for description). There is mild  asymmetric left ventricular hypertrophy of the infero-lateral segment.  Left ventricular diastolic parameters are indeterminate.   2. Right ventricular systolic function is mildly reduced. The right  ventricular size is normal. There is normal pulmonary artery systolic  pressure. The estimated right ventricular systolic pressure is [id number] mmHg.   3. Left atrial size was moderately dilated.   4. The mitral valve is grossly normal. Moderate to severe mitral valve  regurgitation with splay artifact, suggesting this may be underestimated.  No evidence of mitral stenosis.   5. The aortic valve is tricuspid. Aortic valve regurgitation is not  visualized. No aortic stenosis is present.   6. The inferior vena cava is normal in size with greater than 50%  respiratory variability, suggesting right atrial pressure of 3 mmHg.   Patient Profile     Jordan Kennedy is a 67 y.o. male with a hx of cognitive impairment, schizophrenia residing in group home, tardive dyskinesia, constipation, SBO, anemia, tobacco abuse who is being seen 01/03/2023 for the evaluation of LV dysfunction at the request of Dr. Doristine Bosworth.   Assessment & Plan    Acute HFrEF Acute hypoxic respiratory failure  Respiratory failure multifactorial with CAP and new CHF. BNP elevated to 985.0. TTE with LVEF 25-30%, mild asymmetric left ventricular hypertrophy of the infero-lateral segment. The anterior septum, mid inferoseptal segment, and basal inferoseptal segment are akinetic. The apical septal segment is hypokinetic.   -->Continue IV diuresis; lying flat not on O2, perhaps one day or so of IV than can transition to orals. Not grossly  overloaded Continue  Metoprolol 12.5mg  BID with elevated HR.  -->start low dose spironolactone 3/16 ->start farxiga 10 mg daily ->start entresto  PNA/bronchitis Infection management per primary team.   Elevated troponin  Patient with elevated but flat troponin, 26->28->30. Most likely demand mediated. Though patient has some WMA as noted above, continue to favor medical management at this time.  Moderate to severe MR  Could consider repeating TTE in limited fashion to assess once euvolemic; appears functional MR.  PVCs  No significant PVC burden noted overnight/today on telemetry.  QTC prolongation: has interventricular conduction delay; Jtc < 500 ms   Transaminitis  Patient continues with progressing elevation of transaminases. No improvement yet with diuresis, but could be a component of hepatic congestion. Will defer additional workup to primary team.    Lab Results  Component Value Date   ALT 200 (H) 01/05/2023   AST 52 (H) 01/05/2023   ALKPHOS 177 (H) 01/05/2023   BILITOT 0.5 01/05/2023     Time Spent Directly with Patient:  I have spent a total of 35 minutes with the patient reviewing hospital notes, telemetry, EKGs, labs and examining the patient as well as establishing an assessment and plan that was discussed personally with the patient.  > 50% of time was spent in direct patient care.      For questions or updates, please contact [place] Please consult [email or web address] for contact info under        Signed, Janina Mayo, MD  01/05/2023, 10:27 AM

## 2023-01-05 NOTE — Progress Notes (Signed)
Frequently plays in genitals and stool.  Removes pads, external urinary devices and is later unable to recall having removed them.  Will continue to monitor.

## 2023-01-05 NOTE — Progress Notes (Signed)
PROGRESS NOTE    Jordan Kennedy  [id number] DOB: [birth year] DOA: 01/01/2023 PCP: Haydee Salter, MD   Brief Narrative:  HPI: Jordan Kennedy is a 67 y.o. male with medical history significant of cognitive impairment, schizophrenia, tardive dyskinesia, chronic constipation, with prior history of SBO who presents with complaints of cough and shortness of breath.  History is obtained from the patient with assistance of his caregiver from the group home.  Symptoms initially started a week and a half ago with a mild nonproductive cough.  Reported associated symptoms of nasal congestion and weakness, but had not been having any fevers at that time.  He had been initially seen by his primary care provider on 3/5 for symptoms.  He was checked for COVID-19 which was negative at that time and was thought to have a viral upper respiratory infection which symptomatic treatment was recommended.  Over the last 2 days patient had been admitted to seem more short of breath walking up and down the steps.  His cough that seemed to become more productive.  Patient does have a history of smoking cigarettes.  At baseline he is normally ambulatory without need of assistance and is not on oxygen at baseline.   In the emergency department patient was noted to be febrile up to 101.4 F with tachycardia, tachypnea, and O2 saturation is as low as 84% on room air with improvement on nonrebreather.  Labs significant for WBC 20.5, hemoglobin 11.7, platelet 490, calcium 8.3, alkaline phosphatase 198, AST 59, and ALT 137.  Chest x-ray concerning for bilateral perihilar airspace disease right greater than left concerning for asymmetric pulmonary edema or diffuse infection.  Patient had initially been given given Rocephin, azithromycin, and Ativan 1 mg p.o.  Assessment & Plan:   Principal Problem:   Severe sepsis ([place]) Active Problems:   Acute respiratory failure with hypoxia (HCC)   Bronchitis due to tobacco use   Prolonged  QT interval   Normocytic anemia   Transaminitis   Intellectual disability   Schizophrenia (HCC)   Thrombocytosis   Chronic constipation   Acute exacerbation of congestive heart failure (HCC)   CAP (community acquired pneumonia)   Acute systolic (congestive) heart failure (HCC)  Acute respiratory failure with hypoxia and severe sepsis secondary to community-acquired pneumonia as well as acute systolic congestive heart failure: Noted to be febrile up to 101.4 F with tachycardia, tachypnea, and white blood cell count elevated 20.5 and acute hypoxic respiratory failure.  Saturations noted to be as low as 84% on room air and required nonrebreather but only requiring 4 L of oxygen and maintaining saturation around 92%. chest x-ray noted bilateral perihilar airspace disease right greater than left with new interval opacities concerning for pulmonary edema or diffuse infection.  Procalcitonin 0.37 but BNP significantly elevated at 985 indicates that he has both acute on chronic congestive heart failure and possibly bacterial pneumonia but more so CHF.  Echo shows 25 to 30% ejection fraction which appears to be new.  Troponin slightly elevated but flat indicating demand ischemia.  Seen by cardiology, given his inability to follow directions, they decided to pursue guideline medical therapy and follow-up patient clinically with no intervention planned and to repeat echo as outpatient down the road in couple of weeks or months.  Completed 5 days of Rocephin and doxycycline.  Cardiology following.  He remains on IV Lasix and has also been started on Aldactone, Entresto and [place].  Repeat procalcitonin was slightly elevated but patient continues to improve clinically  and currently requiring 1 L of oxygen.  Lungs clear to auscultation as well.   Possible bronchitis due to tobacco use: Patient was noted to be wheezing on physical exam at the time of admission.  Patient does have a history of smoking tobacco.  No  formal pulmonary function test on file.  He had been given Decadron IV in the emergency department.  He was started on prednisone.  Completed 5 days of prednisone as well.   Prolonged QT interval Acute.  QTc was noted to be prolonged at 532, but patient was noted to be tachycardic at that time. -Avoid QT prolonging medications.  Will repeat EKG in the morning.   Normocytic anemia Hemoglobin stable.   Transaminitis/elevated LFTs Acute.  On admission alkaline phosphatase 198, AST 59, and ALT 137.  Viral hepatitis panel negative.  LFTs improving.  Intellectual disability Patient currently lives at a group home due to cognitive impairment. Continue sitter   Schizophrenia -Clozapine deferred  due to prolonged QT interval   Thrombocytosis Acute.  Platelet count elevated at 490.  Thought reactive secondary to above.  Improving.   Chronic constipation -Continue senna-docusate, Linzess   Tobacco abuse Patient still reports smoking cigarettes. -Nicotine patch offered  DVT prophylaxis: enoxaparin (LOVENOX) injection 40 mg Start: 01/01/23 1600   Code Status: Full Code  Family Communication:  None present at bedside.   Status is: Inpatient Remains inpatient appropriate because: Patient still hypoxic and requires continued hospitalization.  Patient will likely be ready for discharge hopefully tomorrow if cleared by cardiology.   Estimated body mass index is 26.19 kg/m as calculated from the following:   Height as of this encounter: 6\' 2"  (1.88 m).   Weight as of this encounter: 92.5 kg.    Nutritional Assessment: Body mass index is 26.19 kg/m.Marland Kitchen Seen by dietician.  I agree with the assessment and plan as outlined below: Nutrition Status:        . Skin Assessment: I have examined the patient's skin and I agree with the wound assessment as performed by the wound care RN as outlined below:    Consultants:  None  Procedures:  None  Antimicrobials:  Anti-infectives (From  admission, onward)    Start     Dose/Rate Route Frequency Ordered Stop   01/02/23 1000  cefTRIAXone (ROCEPHIN) 2 g in sodium chloride 0.9 % 100 mL IVPB        2 g 200 mL/hr over 30 Minutes Intravenous Every 24 hours 01/01/23 1458 01/05/23 0930   01/02/23 1000  doxycycline (VIBRAMYCIN) 100 mg in sodium chloride 0.9 % 250 mL IVPB        100 mg 125 mL/hr over 120 Minutes Intravenous Every 12 hours 01/01/23 1458     01/01/23 1200  cefTRIAXone (ROCEPHIN) 2 g in sodium chloride 0.9 % 100 mL IVPB        2 g 200 mL/hr over 30 Minutes Intravenous  Once 01/01/23 1159 01/01/23 1350   01/01/23 1200  azithromycin (ZITHROMAX) 500 mg in sodium chloride 0.9 % 250 mL IVPB        500 mg 250 mL/hr over 60 Minutes Intravenous  Once 01/01/23 1159 01/01/23 1523         Subjective:  Seen and examined.  Denies any shortness of breath.  No other complaint.  Objective: Vitals:   01/05/23 0816 01/05/23 0820 01/05/23 0900 01/05/23 1042  BP: 122/82     Pulse: (!) 105 (!) 104    Resp: (!) 21 18  Temp: 98.8 F (37.1 C)     TempSrc: Oral     SpO2: 93% 94% 92% 92%  Weight:      Height:        Intake/Output Summary (Last 24 hours) at 01/05/2023 1132 Last data filed at 01/05/2023 0944 Gross per 24 hour  Intake 340 ml  Output 2850 ml  Net -2510 ml    Filed Weights   01/01/23 1048  Weight: 92.5 kg    Examination:  General exam: Appears calm and comfortable  Respiratory system: Clear to auscultation. Respiratory effort normal. Cardiovascular system: S1 & S2 heard, RRR. No JVD, murmurs, rubs, gallops or clicks. No pedal edema. Gastrointestinal system: Abdomen is nondistended, soft and nontender. No organomegaly or masses felt. Normal bowel sounds heard. Central nervous system: Alert and oriented X 2. no focal neurological deficits. Extremities: Symmetric 5 x 5 power. Skin: No rashes, lesions or ulcers.  Psychiatry: Judgement and insight appear poor  Data Reviewed: I have personally reviewed  following labs and imaging studies  CBC: Recent Labs  Lab 01/01/23 1055 01/01/23 1716 01/02/23 0434 01/03/23 0221 01/04/23 1113 01/05/23 0321  WBC 20.5*  --  19.3* 21.2* 19.2* 16.3*  NEUTROABS 17.5*  --   --  17.2* 14.6* 12.6*  HGB 11.7* 11.2* 11.7* 12.5* 13.2 12.6*  HCT 36.0* 33.0* 35.9* 38.0* 41.7 38.0*  MCV 97.6  --  95.2 94.1 95.2 94.5  PLT 490*  --  472* 409* 563* 544*    Basic Metabolic Panel: Recent Labs  Lab 01/01/23 1055 01/01/23 1716 01/02/23 0434 01/03/23 0221 01/04/23 1113 01/05/23 0321  NA 141 143 139 137 140 139  K 3.7 3.8 4.2 4.1 3.9 4.2  CL 110  --  104 104 104 103  CO2 21*  --  23 23 25 27   GLUCOSE 156*  --  165* 169* 116* 128*  BUN 13  --  13 23 15 15   CREATININE 1.02  --  1.01 1.10 0.83 0.83  CALCIUM 8.3*  --  8.6* 8.5* 8.9 8.7*  MG  --   --  2.3 2.1  --   --     GFR: Estimated Creatinine Clearance: 104.5 mL/min (by C-G formula based on SCr of 0.83 mg/dL). Liver Function Tests: Recent Labs  Lab 01/01/23 1055 01/02/23 0434 01/03/23 0221 01/04/23 1113 01/05/23 0321  AST 59* 49* 91* 69* 52*  ALT 137* 131* 143* 240* 200*  ALKPHOS 198* 189* 172* 187* 177*  BILITOT 0.5 0.5 0.7 0.5 0.5  PROT 5.7* 5.9* 5.6* 6.3* 5.7*  ALBUMIN 2.7* 2.4* 2.2* 2.5* 2.2*    No results for input(s): "LIPASE", "AMYLASE" in the last 168 hours. No results for input(s): "AMMONIA" in the last 168 hours. Coagulation Profile: No results for input(s): "INR", "PROTIME" in the last 168 hours. Cardiac Enzymes: No results for input(s): "CKTOTAL", "CKMB", "CKMBINDEX", "TROPONINI" in the last 168 hours. BNP (last 3 results) No results for input(s): "PROBNP" in the last 8760 hours. HbA1C: No results for input(s): "HGBA1C" in the last 72 hours. CBG: Recent Labs  Lab 01/01/23 1622  GLUCAP 134*    Lipid Profile: No results for input(s): "CHOL", "HDL", "LDLCALC", "TRIG", "CHOLHDL", "LDLDIRECT" in the last 72 hours. Thyroid Function Tests: Recent Labs    01/04/23 1113   TSH 0.850   Anemia Panel: No results for input(s): "VITAMINB12", "FOLATE", "FERRITIN", "TIBC", "IRON", "RETICCTPCT" in the last 72 hours. Sepsis Labs: Recent Labs  Lab 01/01/23 1620 01/01/23 1630 01/04/23 1113  PROCALCITON 0.37  --  0.70  LATICACIDVEN  --  1.4  --      Recent Results (from the past 240 hour(s))  Resp panel by RT-PCR (RSV, Flu A&B, Covid) Anterior Nasal Swab     Status: None   Collection Time: 01/01/23 10:54 AM   Specimen: Anterior Nasal Swab  Result Value Ref Range Status   SARS Coronavirus 2 by RT PCR NEGATIVE NEGATIVE Final   Influenza A by PCR NEGATIVE NEGATIVE Final   Influenza B by PCR NEGATIVE NEGATIVE Final    Comment: (NOTE) The Xpert Xpress SARS-CoV-2/FLU/RSV plus assay is intended as an aid in the diagnosis of influenza from Nasopharyngeal swab specimens and should not be used as a sole basis for treatment. Nasal washings and aspirates are unacceptable for Xpert Xpress SARS-CoV-2/FLU/RSV testing.  Fact Sheet for Patients: EntrepreneurPulse.com.au  Fact Sheet for Healthcare Providers: IncredibleEmployment.be  This test is not yet approved or cleared by the [place] FDA and has been authorized for detection and/or diagnosis of SARS-CoV-2 by FDA under an Emergency Use Authorization (EUA). This EUA will remain in effect (meaning this test can be used) for the duration of the COVID-19 declaration under Section 564(b)(1) of the Act, 21 U.S.C. section 360bbb-3(b)(1), unless the authorization is terminated or revoked.     Resp Syncytial Virus by PCR NEGATIVE NEGATIVE Final    Comment: (NOTE) Fact Sheet for Patients: EntrepreneurPulse.com.au  Fact Sheet for Healthcare Providers: IncredibleEmployment.be  This test is not yet approved or cleared by the [place] FDA and has been authorized for detection and/or diagnosis of SARS-CoV-2 by FDA under an Emergency Use  Authorization (EUA). This EUA will remain in effect (meaning this test can be used) for the duration of the COVID-19 declaration under Section 564(b)(1) of the Act, 21 U.S.C. section 360bbb-3(b)(1), unless the authorization is terminated or revoked.  Performed at [facility], [place] [address] [place], [place] [address]   Respiratory (~20 pathogens) panel by PCR     Status: None   Collection Time: 01/01/23 11:21 AM   Specimen: Nasopharyngeal Swab; Respiratory  Result Value Ref Range Status   Adenovirus NOT DETECTED NOT DETECTED Final   Coronavirus 229E NOT DETECTED NOT DETECTED Final    Comment: (NOTE) The Coronavirus on the Respiratory Panel, DOES NOT test for the novel  Coronavirus (2019 nCoV)    Coronavirus HKU1 NOT DETECTED NOT DETECTED Final   Coronavirus NL63 NOT DETECTED NOT DETECTED Final   Coronavirus OC43 NOT DETECTED NOT DETECTED Final   Metapneumovirus NOT DETECTED NOT DETECTED Final   Rhinovirus / Enterovirus NOT DETECTED NOT DETECTED Final   Influenza A NOT DETECTED NOT DETECTED Final   Influenza B NOT DETECTED NOT DETECTED Final   Parainfluenza Virus 1 NOT DETECTED NOT DETECTED Final   Parainfluenza Virus 2 NOT DETECTED NOT DETECTED Final   Parainfluenza Virus 3 NOT DETECTED NOT DETECTED Final   Parainfluenza Virus 4 NOT DETECTED NOT DETECTED Final   Respiratory Syncytial Virus NOT DETECTED NOT DETECTED Final   Bordetella pertussis NOT DETECTED NOT DETECTED Final   Bordetella Parapertussis NOT DETECTED NOT DETECTED Final   Chlamydophila pneumoniae NOT DETECTED NOT DETECTED Final   Mycoplasma pneumoniae NOT DETECTED NOT DETECTED Final    Comment: Performed at [facility], [place]. [address] [place], [place] [address]  Culture, blood (Routine X 2) w Reflex to ID Panel     Status: None (Preliminary result)   Collection Time: 01/01/23  4:43 PM   Specimen: BLOOD  Result Value Ref  Range Status   Specimen Description BLOOD RIGHT ANTECUBITAL  Final   Special  Requests   Final    BOTTLES DRAWN AEROBIC AND ANAEROBIC Blood Culture adequate volume   Culture   Final    NO GROWTH 4 DAYS Performed at [facility], [address] [place], [place] [address]    Report Status PENDING  Incomplete  Culture, blood (Routine X 2) w Reflex to ID Panel     Status: None (Preliminary result)   Collection Time: 01/01/23  8:55 PM   Specimen: BLOOD  Result Value Ref Range Status   Specimen Description BLOOD LEFT ANTECUBITAL  Final   Special Requests Blood Culture adequate volume  Final   Culture   Final    NO GROWTH 4 DAYS Performed at [facility], [place] [address], [place], [place] [address]    Report Status PENDING  Incomplete     Radiology Studies: No results found.  Scheduled Meds:  amantadine  100 mg Oral QHS   aspirin EC  81 mg Oral Daily   clotrimazole   Topical BID   cloZAPine  200 mg Oral BID   dapagliflozin propanediol  10 mg Oral Daily   enoxaparin (LOVENOX) injection  40 mg Subcutaneous Q24H   furosemide  40 mg Intravenous BID   guaiFENesin  600 mg Oral BID   levalbuterol  0.63 mg Nebulization TID   linaclotide  290 mcg Oral QAC breakfast   melatonin  10 mg Oral QHS   metoCLOPramide  5 mg Oral BID   metoprolol tartrate  12.5 mg Oral BID   mirabegron ER  50 mg Oral Daily   nicotine  14 mg Transdermal Daily   pantoprazole  40 mg Oral QPM   PARoxetine  40 mg Oral QPM   sacubitril-valsartan  1 tablet Oral BID   senna-docusate  1 tablet Oral BID   sodium chloride flush  3 mL Intravenous Q12H   spironolactone  12.5 mg Oral Daily   tamsulosin  0.4 mg Oral Daily   zolpidem  10 mg Oral QHS   Continuous Infusions:  doxycycline (VIBRAMYCIN) IV 100 mg (01/05/23 0948)     LOS: 4 days   Darliss Cheney, MD [facility]  01/05/2023, 11:32 AM   *Please note that this is a verbal dictation therefore any spelling or grammatical errors are due to the "[place] [employer]" system interpretation.  Please page via [place] and do not  message via secure chat for urgent patient care matters. Secure chat can be used for non urgent patient care matters.  How to contact the [facility] Attending or Consulting provider [place] or covering provider during after hours [place], for this patient?  Check the care team in [facility] and look for a) attending/consulting [facility] provider listed and b) the [facility] team listed. Page or secure chat [phone]. Log into [email or web address] and use [place]'s universal password to access. If you do not have the password, please contact the hospital operator. Locate the [facility] provider you are looking for under [facility] and page to a number that you can be directly reached. If you still have difficulty reaching the provider, please page the [facility] (Director on Call) for the Hospitalists listed on amion for assistance.

## 2023-01-06 DIAGNOSIS — I5023 Acute on chronic systolic (congestive) heart failure: Secondary | ICD-10-CM | POA: Diagnosis not present

## 2023-01-06 DIAGNOSIS — A419 Sepsis, unspecified organism: Secondary | ICD-10-CM | POA: Diagnosis not present

## 2023-01-06 DIAGNOSIS — R6520 Severe sepsis without septic shock: Secondary | ICD-10-CM | POA: Diagnosis not present

## 2023-01-06 LAB — CBC WITH DIFFERENTIAL/PLATELET
Abs Immature Granulocytes: 0.35 10*3/uL — ABNORMAL HIGH (ref 0.00–0.07)
Basophils Absolute: 0.1 10*3/uL (ref 0.0–0.1)
Basophils Relative: 0 %
Eosinophils Absolute: 0.2 10*3/uL (ref 0.0–0.5)
Eosinophils Relative: 1 %
HCT: 39.4 % (ref 39.0–52.0)
Hemoglobin: 12.8 g/dL — ABNORMAL LOW (ref 13.0–17.0)
Immature Granulocytes: 2 %
Lymphocytes Relative: 16 %
Lymphs Abs: 3 10*3/uL (ref 0.7–4.0)
MCH: 30.7 pg (ref 26.0–34.0)
MCHC: 32.5 g/dL (ref 30.0–36.0)
MCV: 94.5 fL (ref 80.0–100.0)
Monocytes Absolute: 1.8 10*3/uL — ABNORMAL HIGH (ref 0.1–1.0)
Monocytes Relative: 9 %
Neutro Abs: 13.6 10*3/uL — ABNORMAL HIGH (ref 1.7–7.7)
Neutrophils Relative %: 72 %
Platelets: 604 10*3/uL — ABNORMAL HIGH (ref 150–400)
RBC: 4.17 MIL/uL — ABNORMAL LOW (ref 4.22–5.81)
RDW: 15.9 % — ABNORMAL HIGH (ref 11.5–15.5)
WBC: 19 10*3/uL — ABNORMAL HIGH (ref 4.0–10.5)
nRBC: 0 % (ref 0.0–0.2)

## 2023-01-06 LAB — CULTURE, BLOOD (ROUTINE X 2)
Culture: NO GROWTH
Culture: NO GROWTH
Special Requests: ADEQUATE
Special Requests: ADEQUATE

## 2023-01-06 LAB — PROCALCITONIN: Procalcitonin: 0.29 ng/mL

## 2023-01-06 LAB — BASIC METABOLIC PANEL
Anion gap: 9 (ref 5–15)
BUN: 20 mg/dL (ref 8–23)
CO2: 27 mmol/L (ref 22–32)
Calcium: 8.7 mg/dL — ABNORMAL LOW (ref 8.9–10.3)
Chloride: 103 mmol/L (ref 98–111)
Creatinine, Ser: 0.96 mg/dL (ref 0.61–1.24)
GFR, Estimated: >60 mL/min (ref >60)
Glucose, Bld: 131 mg/dL — ABNORMAL HIGH (ref 70–99)
Potassium: 3.9 mmol/L (ref 3.5–5.1)
Sodium: 139 mmol/L (ref 135–145)

## 2023-01-06 NOTE — Progress Notes (Signed)
Pt on room air and stable. Pt refusing BIPAP QHS with no active distress noted. Will continue to monitor

## 2023-01-06 NOTE — Progress Notes (Signed)
SATURATION QUALIFICATIONS: (This note is used to comply with regulatory documentation for home oxygen)  Patient Saturations on Room Air at Rest = 93%  Patient Saturations on Room Air while Ambulating = 76%  Patient Saturations on 3 Liters of oxygen while Ambulating = 92%  Please briefly explain why patient needs home oxygen: Pt is requiring supplemental O2 to maintain sats >/= 92%   Moishe Spice, PT, DPT Acute Rehabilitation Services  Office: [phone]

## 2023-01-06 NOTE — Progress Notes (Signed)
PROGRESS NOTE    Jordan Kennedy  [id number] DOB: 1956/10/22 DOA: 01/01/2023 PCP: Haydee Salter, MD   Brief Narrative:  HPI: Jordan Kennedy is a 67 y.o. male with medical history significant of cognitive impairment, schizophrenia, tardive dyskinesia, chronic constipation, with prior history of SBO who presents with complaints of cough and shortness of breath.  History is obtained from the patient with assistance of his caregiver from the group home.  Symptoms initially started a week and a half ago with a mild nonproductive cough.  Reported associated symptoms of nasal congestion and weakness, but had not been having any fevers at that time.  He had been initially seen by his primary care provider on 3/5 for symptoms.  He was checked for COVID-19 which was negative at that time and was thought to have a viral upper respiratory infection which symptomatic treatment was recommended.  Over the last 2 days patient had been admitted to seem more short of breath walking up and down the steps.  His cough that seemed to become more productive.  Patient does have a history of smoking cigarettes.  At baseline he is normally ambulatory without need of assistance and is not on oxygen at baseline.   In the emergency department patient was noted to be febrile up to 101.4 F with tachycardia, tachypnea, and O2 saturation is as low as 84% on room air with improvement on nonrebreather.  Labs significant for WBC 20.5, hemoglobin 11.7, platelet 490, calcium 8.3, alkaline phosphatase 198, AST 59, and ALT 137.  Chest x-ray concerning for bilateral perihilar airspace disease right greater than left concerning for asymmetric pulmonary edema or diffuse infection.  Patient had initially been given given Rocephin, azithromycin, and Ativan 1 mg p.o.  Assessment & Plan:   Principal Problem:   Severe sepsis ([place]) Active Problems:   Acute respiratory failure with hypoxia (HCC)   Bronchitis due to tobacco use   Prolonged  QT interval   Normocytic anemia   Transaminitis   Intellectual disability   Schizophrenia (HCC)   Thrombocytosis   Chronic constipation   Acute exacerbation of congestive heart failure (HCC)   CAP (community acquired pneumonia)   Acute systolic (congestive) heart failure (HCC)  Acute respiratory failure with hypoxia and severe sepsis secondary to community-acquired pneumonia as well as acute systolic congestive heart failure: Noted to be febrile up to 101.4 F with tachycardia, tachypnea, and white blood cell count elevated 20.5 and acute hypoxic respiratory failure.  Saturations noted to be as low as 84% on room air and required nonrebreather but only requiring 4 L of oxygen and maintaining saturation around 92%. chest x-ray noted bilateral perihilar airspace disease right greater than left with new interval opacities concerning for pulmonary edema or diffuse infection.  Procalcitonin 0.37 but BNP significantly elevated at 985 indicates that he has both acute on chronic congestive heart failure and possibly bacterial pneumonia but more so CHF.  Echo shows 25 to 30% ejection fraction which appears to be new.  Troponin slightly elevated but flat indicating demand ischemia.  Seen by cardiology, given his inability to follow directions, they decided to pursue guideline medical therapy and follow-up patient clinically with no intervention planned and to repeat echo as outpatient down the road in couple of weeks or months.  Completed 5 days of Rocephin and doxycycline.  Cardiology following.  He remains on IV Lasix and has also been started on Aldactone, Entresto and [place].  Repeat procalcitonin was slightly elevated but patient continues to improve clinically  and currently requiring 1 L of oxygen.  Lungs clear to auscultation as well.  He is net negative almost 10 L since admission.  Ordered ambulatory oximetry but per nurse, patient was unable to stand for more than a brief period of time so unable to do  ambulatory oximetry but he was still requiring 2 L of oxygen.  I will consult PT OT to see if he qualifies for SNF.  His procalcitonin improved as well.   Possible bronchitis due to tobacco use: Patient was noted to be wheezing on physical exam at the time of admission.  Patient does have a history of smoking tobacco.  No formal pulmonary function test on file.  He had been given Decadron IV in the emergency department.  He was started on prednisone.  Completed 5 days of prednisone as well.   Prolonged QT interval Acute.  QTc was noted to be prolonged at 532, but patient was noted to be tachycardic at that time. -Avoid QT prolonging medications.  Repeat EKG today.   Normocytic anemia Hemoglobin stable.   Transaminitis/elevated LFTs Acute.  On admission alkaline phosphatase 198, AST 59, and ALT 137.  Viral hepatitis panel negative.  LFTs improving.  Intellectual disability Patient currently lives at a group home due to cognitive impairment. Continue sitter   Schizophrenia -Clozapine deferred  due to prolonged QT interval   Thrombocytosis Acute.  Platelet count elevated at 490.  Thought reactive secondary to above.  Improving.   Chronic constipation -Continue senna-docusate, Linzess   Tobacco abuse Patient still reports smoking cigarettes. -Nicotine patch offered  DVT prophylaxis: enoxaparin (LOVENOX) injection 40 mg Start: 01/01/23 1600   Code Status: Full Code  Family Communication:  None present at bedside.   Status is: Inpatient Remains inpatient appropriate because: Patient still hypoxic and requires continued hospitalization.  He is near ready.   Estimated body mass index is 25.05 kg/m as calculated from the following:   Height as of this encounter: 6\' 2"  (1.88 m).   Weight as of this encounter: 88.5 kg.    Nutritional Assessment: Body mass index is 25.05 kg/m.Marland Kitchen Seen by dietician.  I agree with the assessment and plan as outlined below: Nutrition Status:         . Skin Assessment: I have examined the patient's skin and I agree with the wound assessment as performed by the wound care RN as outlined below:    Consultants:  None  Procedures:  None  Antimicrobials:  Anti-infectives (From admission, onward)    Start     Dose/Rate Route Frequency Ordered Stop   01/02/23 1000  cefTRIAXone (ROCEPHIN) 2 g in sodium chloride 0.9 % 100 mL IVPB        2 g 200 mL/hr over 30 Minutes Intravenous Every 24 hours 01/01/23 1458 01/05/23 0930   01/02/23 1000  doxycycline (VIBRAMYCIN) 100 mg in sodium chloride 0.9 % 250 mL IVPB        100 mg 125 mL/hr over 120 Minutes Intravenous Every 12 hours 01/01/23 1458 01/05/23 2300   01/01/23 1200  cefTRIAXone (ROCEPHIN) 2 g in sodium chloride 0.9 % 100 mL IVPB        2 g 200 mL/hr over 30 Minutes Intravenous  Once 01/01/23 1159 01/01/23 1350   01/01/23 1200  azithromycin (ZITHROMAX) 500 mg in sodium chloride 0.9 % 250 mL IVPB        500 mg 250 mL/hr over 60 Minutes Intravenous  Once 01/01/23 1159 01/01/23 1523  Subjective:  Patient seen and examined.  Denies any shortness of breath or any other complaint.  Objective: Vitals:   01/06/23 0510 01/06/23 0512 01/06/23 0800 01/06/23 0806  BP:      Pulse: 73 73    Resp: 16 18    Temp:   98.1 F (36.7 C)   TempSrc:   Oral   SpO2: (!) 89% 91%  100%  Weight:      Height:        Intake/Output Summary (Last 24 hours) at 01/06/2023 1215 Last data filed at 01/06/2023 [id number] Gross per 24 hour  Intake 720 ml  Output 2300 ml  Net -1580 ml    Filed Weights   01/01/23 1048 01/05/23 0735 01/05/23 1615  Weight: 92.5 kg 88.5 kg 88.5 kg    Examination:  General exam: Appears calm and comfortable  Respiratory system: Clear to auscultation. Respiratory effort normal. Cardiovascular system: S1 & S2 heard, RRR. No JVD, murmurs, rubs, gallops or clicks. No pedal edema. Gastrointestinal system: Abdomen is nondistended, soft and nontender. No organomegaly or  masses felt. Normal bowel sounds heard. Central nervous system: Alert and oriented x 2. No focal neurological deficits. Extremities: Symmetric 5 x 5 power. Skin: No rashes, lesions or ulcers.   Data Reviewed: I have personally reviewed following labs and imaging studies  CBC: Recent Labs  Lab 01/01/23 1055 01/01/23 1716 01/02/23 0434 01/03/23 0221 01/04/23 1113 01/05/23 0321 01/06/23 0353  WBC 20.5*  --  19.3* 21.2* 19.2* 16.3* 19.0*  NEUTROABS 17.5*  --   --  17.2* 14.6* 12.6* 13.6*  HGB 11.7*   < > 11.7* 12.5* 13.2 12.6* 12.8*  HCT 36.0*   < > 35.9* 38.0* 41.7 38.0* 39.4  MCV 97.6  --  95.2 94.1 95.2 94.5 94.5  PLT 490*  --  472* 409* 563* 544* 604*   < > = values in this interval not displayed.    Basic Metabolic Panel: Recent Labs  Lab 01/02/23 0434 01/03/23 0221 01/04/23 1113 01/05/23 0321 01/06/23 0353  NA 139 137 140 139 139  K 4.2 4.1 3.9 4.2 3.9  CL 104 104 104 103 103  CO2 23 23 25 27 27   GLUCOSE 165* 169* 116* 128* 131*  BUN 13 23 15 15 20   CREATININE 1.01 1.10 0.83 0.83 0.96  CALCIUM 8.6* 8.5* 8.9 8.7* 8.7*  MG 2.3 2.1  --   --   --     GFR: Estimated Creatinine Clearance: 90.4 mL/min (by C-G formula based on SCr of 0.96 mg/dL). Liver Function Tests: Recent Labs  Lab 01/01/23 1055 01/02/23 0434 01/03/23 0221 01/04/23 1113 01/05/23 0321  AST 59* 49* 91* 69* 52*  ALT 137* 131* 143* 240* 200*  ALKPHOS 198* 189* 172* 187* 177*  BILITOT 0.5 0.5 0.7 0.5 0.5  PROT 5.7* 5.9* 5.6* 6.3* 5.7*  ALBUMIN 2.7* 2.4* 2.2* 2.5* 2.2*    No results for input(s): "LIPASE", "AMYLASE" in the last 168 hours. No results for input(s): "AMMONIA" in the last 168 hours. Coagulation Profile: No results for input(s): "INR", "PROTIME" in the last 168 hours. Cardiac Enzymes: No results for input(s): "CKTOTAL", "CKMB", "CKMBINDEX", "TROPONINI" in the last 168 hours. BNP (last 3 results) No results for input(s): "PROBNP" in the last 8760 hours. HbA1C: No results for  input(s): "HGBA1C" in the last 72 hours. CBG: Recent Labs  Lab 01/01/23 1622  GLUCAP 134*    Lipid Profile: No results for input(s): "CHOL", "HDL", "LDLCALC", "TRIG", "CHOLHDL", "LDLDIRECT" in the last  72 hours. Thyroid Function Tests: Recent Labs    01/04/23 1113  TSH 0.850    Anemia Panel: No results for input(s): "VITAMINB12", "FOLATE", "FERRITIN", "TIBC", "IRON", "RETICCTPCT" in the last 72 hours. Sepsis Labs: Recent Labs  Lab 01/01/23 1620 01/01/23 1630 01/04/23 1113 01/06/23 0353  PROCALCITON 0.37  --  0.70 0.29  LATICACIDVEN  --  1.4  --   --      Recent Results (from the past 240 hour(s))  Resp panel by RT-PCR (RSV, Flu A&B, Covid) Anterior Nasal Swab     Status: None   Collection Time: 01/01/23 10:54 AM   Specimen: Anterior Nasal Swab  Result Value Ref Range Status   SARS Coronavirus 2 by RT PCR NEGATIVE NEGATIVE Final   Influenza A by PCR NEGATIVE NEGATIVE Final   Influenza B by PCR NEGATIVE NEGATIVE Final    Comment: (NOTE) The Xpert Xpress SARS-CoV-2/FLU/RSV plus assay is intended as an aid in the diagnosis of influenza from Nasopharyngeal swab specimens and should not be used as a sole basis for treatment. Nasal washings and aspirates are unacceptable for Xpert Xpress SARS-CoV-2/FLU/RSV testing.  Fact Sheet for Patients: EntrepreneurPulse.com.au  Fact Sheet for Healthcare Providers: IncredibleEmployment.be  This test is not yet approved or cleared by the [place] FDA and has been authorized for detection and/or diagnosis of SARS-CoV-2 by FDA under an Emergency Use Authorization (EUA). This EUA will remain in effect (meaning this test can be used) for the duration of the COVID-19 declaration under Section 564(b)(1) of the Act, 21 U.S.C. section 360bbb-3(b)(1), unless the authorization is terminated or revoked.     Resp Syncytial Virus by PCR NEGATIVE NEGATIVE Final    Comment: (NOTE) Fact Sheet for  Patients: EntrepreneurPulse.com.au  Fact Sheet for Healthcare Providers: IncredibleEmployment.be  This test is not yet approved or cleared by the [place] FDA and has been authorized for detection and/or diagnosis of SARS-CoV-2 by FDA under an Emergency Use Authorization (EUA). This EUA will remain in effect (meaning this test can be used) for the duration of the COVID-19 declaration under Section 564(b)(1) of the Act, 21 U.S.C. section 360bbb-3(b)(1), unless the authorization is terminated or revoked.  Performed at [facility], [place] [address] [place], [place] [address]   Respiratory (~20 pathogens) panel by PCR     Status: None   Collection Time: 01/01/23 11:21 AM   Specimen: Nasopharyngeal Swab; Respiratory  Result Value Ref Range Status   Adenovirus NOT DETECTED NOT DETECTED Final   Coronavirus 229E NOT DETECTED NOT DETECTED Final    Comment: (NOTE) The Coronavirus on the Respiratory Panel, DOES NOT test for the novel  Coronavirus (2019 nCoV)    Coronavirus HKU1 NOT DETECTED NOT DETECTED Final   Coronavirus NL63 NOT DETECTED NOT DETECTED Final   Coronavirus OC43 NOT DETECTED NOT DETECTED Final   Metapneumovirus NOT DETECTED NOT DETECTED Final   Rhinovirus / Enterovirus NOT DETECTED NOT DETECTED Final   Influenza A NOT DETECTED NOT DETECTED Final   Influenza B NOT DETECTED NOT DETECTED Final   Parainfluenza Virus 1 NOT DETECTED NOT DETECTED Final   Parainfluenza Virus 2 NOT DETECTED NOT DETECTED Final   Parainfluenza Virus 3 NOT DETECTED NOT DETECTED Final   Parainfluenza Virus 4 NOT DETECTED NOT DETECTED Final   Respiratory Syncytial Virus NOT DETECTED NOT DETECTED Final   Bordetella pertussis NOT DETECTED NOT DETECTED Final   Bordetella Parapertussis NOT DETECTED NOT DETECTED Final   Chlamydophila pneumoniae NOT DETECTED NOT DETECTED Final  Mycoplasma pneumoniae NOT DETECTED NOT DETECTED Final    Comment: Performed at  [facility], [place] [address], [place], [place] [address]  Culture, blood (Routine X 2) w Reflex to ID Panel     Status: None   Collection Time: 01/01/23  4:43 PM   Specimen: BLOOD  Result Value Ref Range Status   Specimen Description BLOOD RIGHT ANTECUBITAL  Final   Special Requests   Final    BOTTLES DRAWN AEROBIC AND ANAEROBIC Blood Culture adequate volume   Culture   Final    NO GROWTH 5 DAYS Performed at [facility], [place] [address] [place], [place] [address]    Report Status 01/06/2023 FINAL  Final  Culture, blood (Routine X 2) w Reflex to ID Panel     Status: None   Collection Time: 01/01/23  8:55 PM   Specimen: BLOOD  Result Value Ref Range Status   Specimen Description BLOOD LEFT ANTECUBITAL  Final   Special Requests Blood Culture adequate volume  Final   Culture   Final    NO GROWTH 5 DAYS Performed at [facility], [place] [address], [place], [place] [address]    Report Status 01/06/2023 FINAL  Final     Radiology Studies: No results found.  Scheduled Meds:  amantadine  100 mg Oral QHS   aspirin EC  81 mg Oral Daily   clotrimazole   Topical BID   cloZAPine  200 mg Oral BID   dapagliflozin propanediol  10 mg Oral Daily   enoxaparin (LOVENOX) injection  40 mg Subcutaneous Q24H   furosemide  40 mg Intravenous BID   guaiFENesin  600 mg Oral BID   levalbuterol  0.63 mg Nebulization BID   linaclotide  290 mcg Oral QAC breakfast   melatonin  10 mg Oral QHS   metoCLOPramide  5 mg Oral BID   metoprolol tartrate  12.5 mg Oral BID   mirabegron ER  50 mg Oral Daily   nicotine  14 mg Transdermal Daily   pantoprazole  40 mg Oral QPM   PARoxetine  40 mg Oral QPM   sacubitril-valsartan  1 tablet Oral BID   senna-docusate  1 tablet Oral BID   sodium chloride flush  3 mL Intravenous Q12H   spironolactone  12.5 mg Oral Daily   tamsulosin  0.4 mg Oral Daily   zolpidem  10 mg Oral QHS   Continuous Infusions:     LOS: 5 days   Darliss Cheney, MD [facility]  01/06/2023, 12:15 PM   *Please note that this is a verbal dictation therefore any spelling or grammatical errors are due to the "[place] [employer]" system interpretation.  Please page via [place] and do not message via secure chat for urgent patient care matters. Secure chat can be used for non urgent patient care matters.  How to contact the [facility] Attending or Consulting provider [place] or covering provider during after hours [place], for this patient?  Check the care team in [facility] and look for a) attending/consulting [facility] provider listed and b) the [facility] team listed. Page or secure chat [phone]. Log into [email or web address] and use [place]'s universal password to access. If you do not have the password, please contact the hospital operator. Locate the [facility] provider you are looking for under [facility] and page to a number that you can be directly reached. If you still have difficulty reaching the provider, please page the [facility] (Director on  Call) for the Hospitalists listed on amion for assistance.

## 2023-01-06 NOTE — Progress Notes (Signed)
1115: attempted to ambulate, patient unable to stand without assistance, was unsteady on feet and had to lowered back to the bed where he stated he felt to weak and requested to lie back down. for the brief moments he was standing, his O2 sat dropped to as low as 80%, recovered to mid/high 80's after lying down on RA and raised to 92% on 2L at patients request.

## 2023-01-06 NOTE — Progress Notes (Signed)
Rounding Note    Patient Name: Jordan Kennedy Date of Encounter: 01/06/2023  [place] Cardiologist: Berniece Salines, DO   Subjective   No new complaints. Getting cleaned.   Inpatient Medications    Scheduled Meds:  amantadine  100 mg Oral QHS   aspirin EC  81 mg Oral Daily   clotrimazole   Topical BID   cloZAPine  200 mg Oral BID   dapagliflozin propanediol  10 mg Oral Daily   enoxaparin (LOVENOX) injection  40 mg Subcutaneous Q24H   furosemide  40 mg Intravenous BID   guaiFENesin  600 mg Oral BID   levalbuterol  0.63 mg Nebulization BID   linaclotide  290 mcg Oral QAC breakfast   melatonin  10 mg Oral QHS   metoCLOPramide  5 mg Oral BID   metoprolol tartrate  12.5 mg Oral BID   mirabegron ER  50 mg Oral Daily   nicotine  14 mg Transdermal Daily   pantoprazole  40 mg Oral QPM   PARoxetine  40 mg Oral QPM   sacubitril-valsartan  1 tablet Oral BID   senna-docusate  1 tablet Oral BID   sodium chloride flush  3 mL Intravenous Q12H   spironolactone  12.5 mg Oral Daily   tamsulosin  0.4 mg Oral Daily   zolpidem  10 mg Oral QHS   Continuous Infusions:  PRN Meds: acetaminophen **OR** acetaminophen, albuterol, mouth rinse, trimethobenzamide   Vital Signs    Vitals:   01/06/23 0411 01/06/23 0510 01/06/23 0512 01/06/23 0806  BP: 94/68     Pulse:  73 73   Resp: 18 16 18    Temp:      TempSrc:      SpO2: 95% (!) 89% 91% 100%  Weight:      Height:        Intake/Output Summary (Last 24 hours) at 01/06/2023 1020 Last data filed at 01/06/2023 [id number] Gross per 24 hour  Intake 970 ml  Output 2300 ml  Net -1330 ml      01/05/2023    4:15 PM 01/05/2023    7:35 AM 01/01/2023   10:48 AM  Last 3 Weights  Weight (lbs) 195 lb 1.7 oz 195 lb 1.7 oz 204 lb  Weight (kg) 88.5 kg 88.5 kg 92.534 kg      Physical Exam   Non focal, in no distress  Labs    High Sensitivity Troponin:   Recent Labs  Lab 01/03/23 0838 01/03/23 1212 01/03/23 1604  TROPONINIHS 30*  28* 26*     Chemistry Recent Labs  Lab 01/02/23 0434 01/03/23 0221 01/04/23 1113 01/05/23 0321 01/06/23 0353  NA 139 137 140 139 139  K 4.2 4.1 3.9 4.2 3.9  CL 104 104 104 103 103  CO2 23 23 25 27 27   GLUCOSE 165* 169* 116* 128* 131*  BUN 13 23 15 15 20   CREATININE 1.01 1.10 0.83 0.83 0.96  CALCIUM 8.6* 8.5* 8.9 8.7* 8.7*  MG 2.3 2.1  --   --   --   PROT 5.9* 5.6* 6.3* 5.7*  --   ALBUMIN 2.4* 2.2* 2.5* 2.2*  --   AST 49* 91* 69* 52*  --   ALT 131* 143* 240* 200*  --   ALKPHOS 189* 172* 187* 177*  --   BILITOT 0.5 0.7 0.5 0.5  --   GFRNONAA >60 >60 >60 >60 >60  ANIONGAP 12 10 11 9 9     Lipids No results for input(s): "CHOL", "TRIG", "  HDL", "LABVLDL", "[place]", "CHOLHDL" in the last 168 hours.  Hematology Recent Labs  Lab 01/04/23 1113 01/05/23 0321 01/06/23 0353  WBC 19.2* 16.3* 19.0*  RBC 4.38 4.02* 4.17*  HGB 13.2 12.6* 12.8*  HCT 41.7 38.0* 39.4  MCV 95.2 94.5 94.5  MCH 30.1 31.3 30.7  MCHC 31.7 33.2 32.5  RDW 16.0* 15.9* 15.9*  PLT 563* 544* 604*   Thyroid  Recent Labs  Lab 01/04/23 1113  TSH 0.850    BNP Recent Labs  Lab 01/01/23 1620  BNP 985.0*    DDimer No results for input(s): "DDIMER" in the last 168 hours.   Radiology    No results found.  Cardiac Studies   Echocardiogram this admit EF 25 to 30%  Patient Profile     67 y.o. male with schizophrenia cognitive impairment tardive dyskinesia with LV dysfunction  Assessment & Plan    Acute systolic heart failure - Respiratory failure as well with pneumonia.  EF 25 to 30% - On IV diuresis, continue today - On low-dose metoprolol 12.5 twice daily, low-dose spironolactone started on 12/1610.5 mg once a day, Farxiga 10 mg a day, Entresto started. Watch BP.  Moderate to severe mitral regurgitation - Continue to monitor echocardiogram.  Hopefully will improve if EF improved.      For questions or updates, please contact [place] Please consult [email or web address] for contact  info under        Signed, Candee Furbish, MD  01/06/2023, 10:20 AM

## 2023-01-06 NOTE — Evaluation (Signed)
Occupational Therapy Evaluation Patient Details Name: Jordan Kennedy MRN: [id number] DOB: Aug 29, 1956 Today's Date: 01/06/2023   History of Present Illness Pt is a 67 y.o. male who presented 01/01/23 with cough and SOB. Pt admitted with acute respiratory failure with hypoxia and severe sepsis secondary to community-acquired pneumonia as well as acute systolic congestive heart failure. PMH: cognitive impairment, schizophrenia, tardive dyskinesia, chronic constipation, anemia, hx of SBO   Clinical Impression   PTA, pt living at group home and per chart, he performed functional mobility and ADL at [place] I level. Upon eval, pt performing seated ADL with supervision. Pt with BLE buckling on initial stand and shakiness during all OOB functional mobility. Pt currently requiring mod A +2 with functional mobility with RW. Recommending SNF for continued rehabilitation to optimize safety during OOB activity. However, if group home employees can assist with all OOB activity, may go home with [facility].      Recommendations for follow up therapy are one component of a multi-disciplinary discharge planning process, led by the attending physician.  Recommendations may be updated based on patient status, additional functional criteria and insurance authorization.   Follow Up Recommendations  Skilled nursing-short term rehab (<3 hours/day)     Assistance Recommended at Discharge Frequent or constant Supervision/Assistance  Patient can return home with the following A lot of help with walking and/or transfers;A lot of help with bathing/dressing/bathroom;Two people to help with walking and/or transfers;Assistance with cooking/housework;Assist for transportation;Help with stairs or ramp for entrance;Direct supervision/assist for financial management;Direct supervision/assist for medications management    Functional Status Assessment  Patient has had a recent decline in their functional status and demonstrates  the ability to make significant improvements in function in a reasonable and predictable amount of time.  Equipment Recommendations  Other (comment) (defer)    Recommendations for Other Services       Precautions / Restrictions Precautions Precautions: Fall;Other (comment) Precaution Comments: watch SpO2 Restrictions Weight Bearing Restrictions: No      Mobility Bed Mobility Overal bed mobility: Needs Assistance Bed Mobility: Supine to Sit, Sit to Supine     Supine to sit: HOB elevated, Min guard Sit to supine: Min guard, HOB elevated   General bed mobility comments: Min guard assist for safety as pt can be quick with bed mobility with poor safety awareness.    Transfers Overall transfer level: Needs assistance Equipment used: Rolling walker (2 wheels), 2 person hand held assist Transfers: Sit to/from Stand Sit to Stand: Min assist, Mod assist, +2 safety/equipment           General transfer comment: Pt with quick transfer to stand but noted trunk sway and knees partially buckling, needing modA to prevent LOB the first rep with bil HHA. MinA the second rep for balance when coming to stand at a RW. +2 for safety      Balance Overall balance assessment: Needs assistance Sitting-balance support: No upper extremity supported, Feet supported Sitting balance-Leahy Scale: Good Sitting balance - Comments: Able to reach off BOS with foot propped up on bed to donn sock without LOB   Standing balance support: Bilateral upper extremity supported, Single extremity supported, During functional activity, Reliant on assistive device for balance Standing balance-Leahy Scale: Poor Standing balance comment: Reliant on AD and external physical assistance.                           ADL either performed or assessed with clinical judgement   ADL Overall  ADL's : Needs assistance/impaired Eating/Feeding: Modified independent;Bed level   Grooming: Set up;Sitting   Upper Body  Bathing: Set up;Sitting   Lower Body Bathing: Moderate assistance;Sit to/from stand   Upper Body Dressing : Set up;Sitting   Lower Body Dressing: Moderate assistance;Sit to/from stand Lower Body Dressing Details (indicate cue type and reason): Able to don socks sitting EOB with supervision, but would not be able to don pants;underpants without A due to poor balance in stahnidng Toilet Transfer: Moderate assistance;+2 for safety/equipment;Rolling walker (2 wheels);Ambulation;Minimal assistance Toilet Transfer Details (indicate cue type and reason): Pt with BLE tremor in standing with nearly myoclonic type jerking resulting in mod A for initial STS transfer. Min-mod A for ambulation with RW as well Toileting- Clothing Manipulation and Hygiene: Min guard;Sitting/lateral lean       Functional mobility during ADLs: Minimal assistance;Moderate assistance;+2 for safety/equipment;Rolling walker (2 wheels)       Vision   Additional Comments: WFL for tasks assessed     Perception     Praxis      Pertinent Vitals/Pain Pain Assessment Pain Assessment: Faces Faces Pain Scale: No hurt Pain Intervention(s): Limited activity within patient's tolerance, Monitored during session     Hand Dominance     Extremity/Trunk Assessment Upper Extremity Assessment Upper Extremity Assessment: Generalized weakness   Lower Extremity Assessment Lower Extremity Assessment: Defer to PT evaluation   Cervical / Trunk Assessment Cervical / Trunk Assessment: Normal   Communication Communication Communication: No difficulties   Cognition Arousal/Alertness: Awake/alert Behavior During Therapy: Impulsive (mildly) Overall Cognitive Status: History of cognitive impairments - at baseline                                 General Comments: Hx of cognitive deficits at baseline and no family present to confirm his baseline. Pt parroting therapist and often stating "yes" or repeating the last option  of a list to therapist when asked questions about home set-up and PLOF. Poor awareness of his deficits and safety, mildly impulsive with transfers.     General Comments  SpO2 >/= 93% on RA at rest initially, but dropped <90% on RA at rest end of session thus returned pt to 2L end of session; SpO2 dropped to 76% on RA when ambulating, >/=    Exercises     Shoulder Instructions      Home Living Family/patient expects to be discharged to:: Group home                                 Additional Comments: Pt parroting therapist and often stating "yes" or repeating the last option of a list to therapist when asked questions about home set-up and PLOF. Pt reports he has a tub/shower combo and his bedroom is upstairs at the group home      Prior Functioning/Environment Prior Level of Function : Needs assist;Patient poor historian/Family not available             Mobility Comments: Per chart, pt was ambulatory without assistance PTA. Per pt, he uses a RW ADLs Comments: Needed assist for cognitive tasks at least.        OT Problem List: Decreased strength;Decreased activity tolerance;Impaired balance (sitting and/or standing);Decreased safety awareness;Decreased cognition;Decreased knowledge of use of DME or AE      OT Treatment/Interventions: Self-care/ADL training;Therapeutic exercise;DME and/or AE instruction;Patient/family education;Balance training;Therapeutic activities;Cognitive remediation/compensation  OT Goals(Current goals can be found in the care plan section) Acute Rehab OT Goals Patient Stated Goal: go home OT Goal Formulation: With patient Time For Goal Achievement: 01/20/23 Potential to Achieve Goals: Good  OT Frequency: Min 2X/week    Co-evaluation PT/OT/SLP Co-Evaluation/Treatment: Yes Reason for Co-Treatment: Necessary to address cognition/behavior during functional activity;For patient/therapist safety;To address functional/ADL transfers PT goals  addressed during session: Mobility/safety with mobility;Balance;Proper use of DME OT goals addressed during session: ADL's and self-care      AM-PAC OT "6 Clicks" Daily Activity     Outcome Measure Help from another person eating meals?: None Help from another person taking care of personal grooming?: A Little Help from another person toileting, which includes using toliet, bedpan, or urinal?: A Lot Help from another person bathing (including washing, rinsing, drying)?: A Little Help from another person to put on and taking off regular upper body clothing?: A Little Help from another person to put on and taking off regular lower body clothing?: A Lot 6 Click Score: 17   End of Session Equipment Utilized During Treatment: Gait belt;Rolling walker (2 wheels) Nurse Communication: Mobility status  Activity Tolerance: Patient tolerated treatment well Patient left: in bed;with call bell/phone within reach;with bed alarm set  OT Visit Diagnosis: Unsteadiness on feet (R26.81);Muscle weakness (generalized) (M62.81);Other abnormalities of gait and mobility (R26.89);Other symptoms and signs involving cognitive function                Time: 1343-1410 OT Time Calculation (min): 27 min Charges:  OT General Charges $OT Visit: 1 Visit OT Evaluation $OT Eval Moderate Complexity: 1 Mod  Elder Cyphers, OTR/L [facility] Acute Rehabilitation Office: [phone]   Magnus Ivan 01/06/2023, 3:34 PM

## 2023-01-06 NOTE — Evaluation (Signed)
Physical Therapy Evaluation Patient Details Name: Jordan Kennedy MRN: [id number] DOB: [birth year] Today's Date: 01/06/2023  History of Present Illness  Pt is a 67 y.o. male who presented 01/01/23 with cough and SOB. Pt admitted with acute respiratory failure with hypoxia and severe sepsis secondary to community-acquired pneumonia as well as acute systolic congestive heart failure. PMH: cognitive impairment, schizophrenia, tardive dyskinesia, chronic constipation, anemia, hx of SBO   Clinical Impression  Pt presents with condition above and deficits mentioned below, see PT Problem List. Pt has a hx of cognitive deficits and often agrees with each statement or picks the last option of a list, parroting the therapist, when asked about PLOF and his group home set-up. No family present to confirm info either. Thus, info in regards to home set-up and PLOF may not be correct, but he did report his bedroom is upstairs and he uses a RW. Chart review reports pt was independent with functional mobility. Currently, pt is requiring up to [facility] for balance with transfers and gait bouts when using a RW, +2 for safety. He is at risk for falls, displaying deficits in gross strength, coordination, balance, cognition, and activity tolerance. Recommending rehab at a SNF prior to return to his group home to address the deficits mentioned above. However, if the group home employees can provide the level of care he currently needs, which includes assistance with all standing mobility, then he could return to the group home with HHPT follow-up. Also, recommend pt stay in a room on the ground floor to avoid stairs. Will continue to follow acutely.     Recommendations for follow up therapy are one component of a multi-disciplinary discharge planning process, led by the attending physician.  Recommendations may be updated based on patient status, additional functional criteria and insurance authorization.  Follow Up Recommendations  Skilled nursing-short term rehab (<3 hours/day) Can patient physically be transported by private vehicle: Yes    Assistance Recommended at Discharge Frequent or constant Supervision/Assistance  Patient can return home with the following  A lot of help with walking and/or transfers;A little help with bathing/dressing/bathroom;Assistance with cooking/housework;Direct supervision/assist for financial management;Direct supervision/assist for medications management;Assist for transportation;Help with stairs or ramp for entrance    Equipment Recommendations Rolling walker (2 wheels);BSC/3in1  Recommendations for Other Services       Functional Status Assessment Patient has had a recent decline in their functional status and demonstrates the ability to make significant improvements in function in a reasonable and predictable amount of time.     Precautions / Restrictions Precautions Precautions: Fall;Other (comment) Precaution Comments: watch SpO2 Restrictions Weight Bearing Restrictions: No      Mobility  Bed Mobility Overal bed mobility: Needs Assistance Bed Mobility: Supine to Sit, Sit to Supine     Supine to sit: HOB elevated, Min guard Sit to supine: Min guard, HOB elevated   General bed mobility comments: Min guard assist for safety as pt can be quick with bed mobility with poor safety awareness.    Transfers Overall transfer level: Needs assistance Equipment used: Rolling walker (2 wheels), 2 person hand held assist Transfers: Sit to/from Stand Sit to Stand: Min assist, Mod assist, +2 safety/equipment           General transfer comment: Pt with quick transfer to stand but noted trunk sway and knees partially buckling, needing modA to prevent LOB the first rep with bil HHA. MinA the second rep for balance when coming to stand at a RW. +2 for safety  Ambulation/Gait Ambulation/Gait assistance: Min assist, Mod assist, +2 safety/equipment Gait Distance (Feet): 60  Feet Assistive device: Rolling walker (2 wheels) Gait Pattern/deviations: Decreased step length - right, Step-through pattern, Decreased step length - left, Decreased stride length, Shuffle, Knee flexed in stance - right, Knee flexed in stance - left, Trunk flexed Gait velocity: reduced Gait velocity interpretation: <1.31 ft/sec, indicative of household ambulator   General Gait Details: Pt takes small, shuffling, unsteady steps with knees flexing spontaneously, but not fully buckling. Min-modA for balance, +2 for safety  Stairs            Wheelchair Mobility    Modified Rankin (Stroke Patients Only)       Balance Overall balance assessment: Needs assistance Sitting-balance support: No upper extremity supported, Feet supported Sitting balance-Leahy Scale: Good Sitting balance - Comments: Able to reach off BOS with foot propped up on bed to donn sock without LOB   Standing balance support: Bilateral upper extremity supported, Single extremity supported, During functional activity, Reliant on assistive device for balance Standing balance-Leahy Scale: Poor Standing balance comment: Reliant on AD and external physical assistance.                             Pertinent Vitals/Pain Pain Assessment Pain Assessment: Faces Faces Pain Scale: No hurt Pain Intervention(s): Monitored during session    Home Living Family/patient expects to be discharged to:: Group home                   Additional Comments: Pt parroting therapist and often stating "yes" or repeating the last option of a list to therapist when asked questions about home set-up and PLOF. Pt reports he has a tub/shower combo and his bedroom is upstairs at the group home    Prior Function Prior Level of Function : Needs assist;Patient poor historian/Family not available             Mobility Comments: Per chart, pt was ambulatory without assistance PTA. Per pt, he uses a RW ADLs Comments: Needed  assist for cognitive tasks at least.     Hand Dominance        Extremity/Trunk Assessment   Upper Extremity Assessment Upper Extremity Assessment: Defer to OT evaluation    Lower Extremity Assessment Lower Extremity Assessment: Generalized weakness    Cervical / Trunk Assessment Cervical / Trunk Assessment: Normal  Communication   Communication: No difficulties  Cognition Arousal/Alertness: Awake/alert Behavior During Therapy: Impulsive (mildly) Overall Cognitive Status: History of cognitive impairments - at baseline                                 General Comments: Hx of cognitive deficits at baseline and no family present to confirm his baseline. Pt parroting therapist and often stating "yes" or repeating the last option of a list to therapist when asked questions about home set-up and PLOF. Poor awareness of his deficits and safety, mildly impulsive with transfers.        General Comments General comments (skin integrity, edema, etc.): SpO2 >/= 93% on RA at rest initially, but dropped <90% on RA at rest end of session thus returned pt to 2L end of session; SpO2 dropped to 76% on RA when ambulating, >/= 92% on 3L when ambulating    Exercises     Assessment/Plan    PT Assessment Patient needs continued PT services  PT Problem List Decreased strength;Decreased activity tolerance;Decreased balance;Decreased mobility;Decreased coordination;Decreased cognition;Decreased safety awareness;Cardiopulmonary status limiting activity       PT Treatment Interventions DME instruction;Gait training;Stair training;Functional mobility training;Therapeutic activities;Therapeutic exercise;Balance training;Neuromuscular re-education;Patient/family education;Cognitive remediation    PT Goals (Current goals can be found in the Care Plan section)  Acute Rehab PT Goals Patient Stated Goal: to go home PT Goal Formulation: With patient Time For Goal Achievement:  01/20/23 Potential to Achieve Goals: Good    Frequency Min 3X/week     Co-evaluation PT/OT/SLP Co-Evaluation/Treatment: Yes Reason for Co-Treatment: Necessary to address cognition/behavior during functional activity;For patient/therapist safety;To address functional/ADL transfers PT goals addressed during session: Mobility/safety with mobility;Balance;Proper use of DME         AM-PAC PT "6 Clicks" Mobility  Outcome Measure Help needed turning from your back to your side while in a flat bed without using bedrails?: A Little Help needed moving from lying on your back to sitting on the side of a flat bed without using bedrails?: A Little Help needed moving to and from a bed to a chair (including a wheelchair)?: A Lot Help needed standing up from a chair using your arms (e.g., wheelchair or bedside chair)?: A Lot Help needed to walk in hospital room?: A Lot Help needed climbing 3-5 steps with a railing? : Total 6 Click Score: 13    End of Session Equipment Utilized During Treatment: Gait belt;Oxygen Activity Tolerance: Patient tolerated treatment well Patient left: in bed;with call bell/phone within reach;with bed alarm set;Other (comment) (on 2L O2) Nurse Communication: Mobility status PT Visit Diagnosis: Unsteadiness on feet (R26.81);Other abnormalities of gait and mobility (R26.89);Muscle weakness (generalized) (M62.81);Difficulty in walking, not elsewhere classified (R26.2)    Time: 1343-1410 PT Time Calculation (min) (ACUTE ONLY): 27 min   Charges:   PT Evaluation $PT Eval Moderate Complexity: 1 Mod          Moishe Spice, PT, DPT Acute Rehabilitation Services  Office: [phone]   Orvan Falconer 01/06/2023, 3:22 PM

## 2023-01-07 ENCOUNTER — Other Ambulatory Visit (HOSPITAL_COMMUNITY): Payer: Self-pay

## 2023-01-07 DIAGNOSIS — I5023 Acute on chronic systolic (congestive) heart failure: Secondary | ICD-10-CM | POA: Diagnosis not present

## 2023-01-07 DIAGNOSIS — R6520 Severe sepsis without septic shock: Secondary | ICD-10-CM | POA: Diagnosis not present

## 2023-01-07 DIAGNOSIS — A419 Sepsis, unspecified organism: Secondary | ICD-10-CM | POA: Diagnosis not present

## 2023-01-07 LAB — CBC WITH DIFFERENTIAL/PLATELET
Abs Immature Granulocytes: 0.69 10*3/uL — ABNORMAL HIGH (ref 0.00–0.07)
Basophils Absolute: 0.1 10*3/uL (ref 0.0–0.1)
Basophils Relative: 1 %
Eosinophils Absolute: 0.4 10*3/uL (ref 0.0–0.5)
Eosinophils Relative: 2 %
HCT: 40.9 % (ref 39.0–52.0)
Hemoglobin: 13.6 g/dL (ref 13.0–17.0)
Immature Granulocytes: 3 %
Lymphocytes Relative: 15 %
Lymphs Abs: 3.6 10*3/uL (ref 0.7–4.0)
MCH: 30.9 pg (ref 26.0–34.0)
MCHC: 33.3 g/dL (ref 30.0–36.0)
MCV: 93 fL (ref 80.0–100.0)
Monocytes Absolute: 2.1 10*3/uL — ABNORMAL HIGH (ref 0.1–1.0)
Monocytes Relative: 9 %
Neutro Abs: 16.6 10*3/uL — ABNORMAL HIGH (ref 1.7–7.7)
Neutrophils Relative %: 70 %
Platelets: 615 10*3/uL — ABNORMAL HIGH (ref 150–400)
RBC: 4.4 MIL/uL (ref 4.22–5.81)
RDW: 15.5 % (ref 11.5–15.5)
WBC: 23.5 10*3/uL — ABNORMAL HIGH (ref 4.0–10.5)
nRBC: 0 % (ref 0.0–0.2)

## 2023-01-07 LAB — BASIC METABOLIC PANEL
Anion gap: 7 (ref 5–15)
BUN: 22 mg/dL (ref 8–23)
CO2: 27 mmol/L (ref 22–32)
Calcium: 8.3 mg/dL — ABNORMAL LOW (ref 8.9–10.3)
Chloride: 102 mmol/L (ref 98–111)
Creatinine, Ser: 1.15 mg/dL (ref 0.61–1.24)
GFR, Estimated: >60 mL/min (ref >60)
Glucose, Bld: 120 mg/dL — ABNORMAL HIGH (ref 70–99)
Potassium: 4.1 mmol/L (ref 3.5–5.1)
Sodium: 136 mmol/L (ref 135–145)

## 2023-01-07 MED ORDER — FUROSEMIDE 40 MG PO TABS
40.0000 mg | ORAL_TABLET | Freq: Every day | ORAL | 0 refills | Status: DC
  Filled 2023-01-07: qty 30, 30d supply, fill #0

## 2023-01-07 MED ORDER — SPIRONOLACTONE 25 MG PO TABS
12.5000 mg | ORAL_TABLET | Freq: Every day | ORAL | 0 refills | Status: DC
  Filled 2023-01-07: qty 15, 30d supply, fill #0

## 2023-01-07 MED ORDER — SACUBITRIL-VALSARTAN 24-26 MG PO TABS
1.0000 | ORAL_TABLET | Freq: Two times a day (BID) | ORAL | 0 refills | Status: DC
  Filled 2023-01-07: qty 60, 30d supply, fill #0

## 2023-01-07 MED ORDER — METOPROLOL TARTRATE 25 MG PO TABS
12.5000 mg | ORAL_TABLET | Freq: Two times a day (BID) | ORAL | 0 refills | Status: DC
  Filled 2023-01-07: qty 30, 30d supply, fill #0

## 2023-01-07 MED ORDER — NICOTINE 14 MG/24HR TD PT24
14.0000 mg | MEDICATED_PATCH | Freq: Every day | TRANSDERMAL | 0 refills | Status: DC
  Filled 2023-01-07: qty 28, 28d supply, fill #0

## 2023-01-07 MED ORDER — DAPAGLIFLOZIN PROPANEDIOL 10 MG PO TABS
10.0000 mg | ORAL_TABLET | Freq: Every day | ORAL | 0 refills | Status: DC
  Filled 2023-01-07: qty 30, 30d supply, fill #0

## 2023-01-07 MED ORDER — FUROSEMIDE 40 MG PO TABS: 40.0000 mg | ORAL_TABLET | Freq: Every day | ORAL | Status: DC

## 2023-01-07 NOTE — Progress Notes (Signed)
Physical Therapy Treatment Patient Details Name: Jordan Kennedy MRN: [id number] DOB: [birth year] Today's Date: 01/07/2023   History of Present Illness Pt is a 67 y.o. male who presented 01/01/23 with cough and SOB. Pt admitted with acute respiratory failure with hypoxia and severe sepsis secondary to community-acquired pneumonia as well as acute systolic congestive heart failure. PMH: cognitive impairment, schizophrenia, tardive dyskinesia, chronic constipation, anemia, hx of SBO    PT Comments    Pt with less impulsivity this date however still remains inconsistent with report of PLOF. Pt with increased ambulation tolerance to 120' with RW. Pt remains unsteady requiring assist for walker management however suspect this is related to his h/o tardive dyskinesia. Per CSW who has spoken to the caregivers at his group home pt was indep and didn't use a RW. His room is on the second floor requiring him to negotiate a flight of stairs. Pt at this time requires increased assist and use of RW at this time and is unsafe to negotiate stairs. Pt to benefit from short term SNF stay to achieve safe mod I level of function to transition back to group home safely. Acute PT to cont to follow.   Recommendations for follow up therapy are one component of a multi-disciplinary discharge planning process, led by the attending physician.  Recommendations may be updated based on patient status, additional functional criteria and insurance authorization.  Follow Up Recommendations  Skilled nursing-short term rehab (<3 hours/day) Can patient physically be transported by private vehicle: Yes   Assistance Recommended at Discharge Frequent or constant Supervision/Assistance  Patient can return home with the following A little help with bathing/dressing/bathroom;Assistance with cooking/housework;Direct supervision/assist for financial management;Direct supervision/assist for medications management;Assist for transportation;Help  with stairs or ramp for entrance;A little help with walking and/or transfers   Equipment Recommendations  Rolling walker (2 wheels);BSC/3in1 (pt reports using RW at home)    Recommendations for Other Services       Precautions / Restrictions Precautions Precautions: Fall;Other (comment) Precaution Comments: watch SpO2 Restrictions Weight Bearing Restrictions: No     Mobility  Bed Mobility Overal bed mobility: Needs Assistance Bed Mobility: Supine to Sit, Sit to Supine     Supine to sit: HOB elevated, Min guard Sit to supine: Min guard, HOB elevated   General bed mobility comments: HOB elevated, no physical assist needed    Transfers Overall transfer level: Needs assistance Equipment used: Rolling walker (2 wheels), 2 person hand held assist Transfers: Sit to/from Stand Sit to Stand: Min assist, +2 safety/equipment           General transfer comment: verbal cues for safe hand placement, quick to move however pt 6'2" and rising from low surface height requiring rocking momentum, pt with wide base of support    Ambulation/Gait Ambulation/Gait assistance: Min assist, +2 safety/equipment Gait Distance (Feet): 120 Feet Assistive device: Rolling walker (2 wheels) Gait Pattern/deviations: Decreased step length - right, Step-through pattern, Decreased step length - left, Decreased stride length, Shuffle, Knee flexed in stance - right, Knee flexed in stance - left, Trunk flexed Gait velocity: reduced Gait velocity interpretation: <1.31 ft/sec, indicative of household ambulator   General Gait Details: Pt takes small, shuffling, unsteady steps with knees flexing spontaneously, but not fully buckling which correlates with his h/o of tardive dyskinesia. minA for balance, 2nd person helpful for line management. pt requiring occasional assist with walker management when turning or going around obstacles   Stairs  Wheelchair Mobility    Modified Rankin (Stroke  Patients Only)       Balance Overall balance assessment: Needs assistance Sitting-balance support: No upper extremity supported, Feet supported Sitting balance-Leahy Scale: Good     Standing balance support: Bilateral upper extremity supported, Single extremity supported, During functional activity, Reliant on assistive device for balance Standing balance-Leahy Scale: Poor Standing balance comment: Reliant on AD and external physical assistance.                            Cognition Arousal/Alertness: Awake/alert Behavior During Therapy: WFL for tasks assessed/performed Overall Cognitive Status: History of cognitive impairments - at baseline                                 General Comments: pt with hx of cognitive deficits. Pt was not impulsive this date compared to initial eval, pt asking when he can get up and able to voice his needs. Pt followed commands consistantly.        Exercises      General Comments General comments (skin integrity, edema, etc.): SpO2 >93% on RA, assisted pt to bathroom however unsuccesful      Pertinent Vitals/Pain Pain Assessment Pain Assessment: No/denies pain    Home Living                          Prior Function            PT Goals (current goals can now be found in the care plan section) Acute Rehab PT Goals Patient Stated Goal: to go home PT Goal Formulation: With patient Time For Goal Achievement: 01/20/23 Potential to Achieve Goals: Good Progress towards PT goals: Progressing toward goals    Frequency    Min 3X/week      PT Plan Discharge plan needs to be updated;Current plan remains appropriate    Co-evaluation              AM-PAC PT "6 Clicks" Mobility   Outcome Measure  Help needed turning from your back to your side while in a flat bed without using bedrails?: A Little Help needed moving from lying on your back to sitting on the side of a flat bed without using bedrails?:  A Little Help needed moving to and from a bed to a chair (including a wheelchair)?: A Little Help needed standing up from a chair using your arms (e.g., wheelchair or bedside chair)?: A Little Help needed to walk in hospital room?: A Lot Help needed climbing 3-5 steps with a railing? : Total 6 Click Score: 15    End of Session Equipment Utilized During Treatment: Gait belt Activity Tolerance: Patient tolerated treatment well Patient left: in bed;with call bell/phone within reach;with bed alarm set Nurse Communication: Mobility status PT Visit Diagnosis: Unsteadiness on feet (R26.81);Other abnormalities of gait and mobility (R26.89);Muscle weakness (generalized) (M62.81);Difficulty in walking, not elsewhere classified (R26.2)     Time: [id number] PT Time Calculation (min) (ACUTE ONLY): 22 min  Charges:  $Gait Training: 8-22 mins                     Kittie Plater, PT, DPT Acute Rehabilitation Services Secure chat preferred Office #: [phone]    Berline Lopes 01/07/2023, 2:13 PM

## 2023-01-07 NOTE — TOC Progression Note (Signed)
Transition of Care [facility]) - Progression Note    Patient Details  Name: Brittney Remund MRN: [id number] Date of Birth: 1956-07-02  Transition of Care [facility]) CM/SW [place], LCSW Phone Number: 01/07/2023, 9:31 AM  Clinical Narrative:    CSW spoke with patient's DSS Guardian, [name], and went over SNF recommendations. She reported agreement with a SNF workup and requests [facility] if possible, though she stated she hopes patient will be able to return to his group home as he loves the day program that he attends. CSW requested [facility] review referral. Nehem[facility] Massed is under review.   Expected Discharge Plan: Skilled Nursing Facility Barriers to Discharge: SNF Pending bed offer, Awaiting State Approval (PASRR), Continued Medical Work up  Expected Discharge Plan and Services In-house Referral: Clinical Social Work     Living arrangements for the past 2 months: Group Home                                       Social Determinants of Health (SDOH) Interventions SDOH Screenings   Food Insecurity: No Food Insecurity (10/16/2022)  Housing: Low Risk  (10/11/2021)  Transportation Needs: No Transportation Needs (10/16/2022)  Alcohol Screen: Low Risk  (10/11/2021)  Depression (PHQ2-9): Low Risk  (10/16/2022)  Financial Resource Strain: Low Risk  (10/16/2022)  Physical Activity: Inactive (10/16/2022)  Social Connections: Socially Isolated (10/11/2021)  Stress: No Stress Concern Present (10/16/2022)  Tobacco Use: High Risk (01/01/2023)    Readmission Risk Interventions     No data to display

## 2023-01-07 NOTE — TOC Transition Note (Signed)
Transition of Care [facility]) - CM/SW Discharge Note   Patient Details  Name: Jordan Kennedy MRN: [id number] Date of Birth: [birth year]  [facility] of [facility]) CM/SW Contact:  Benard Halsted, LCSW Phone Number: 01/07/2023, 4:30 PM   Clinical Narrative:    [name] ready to transport patient home pending delivery of a walker (rotech) and medications. Home Health arranged with [facility].    Final next level of care: Group Home Barriers to Discharge: Barriers Resolved   Patient Goals and CMS Choice CMS Medicare.gov Compare Post Acute Care list provided to:: Legal Guardian Choice offered to / list presented to : [facility] POA / Guardian  Discharge Placement                  Patient to be transferred to facility by: [name] Name of family member notified: [name] Patient and family notified of of transfer: 01/07/23  Discharge Plan and Services Additional resources added to the After Visit Summary for   In-house Referral: Clinical Social Work              DME Arranged: Gilford Rile rolling DME Agency: [facility] [employer] Date DME Agency Contacted: 01/07/23   Representative spoke with at DME Agency: Brenton Grills HH Arranged: PT, OT, Social Work [employer] Agency: [facility] Date [place] Agency Contacted: 01/07/23      Social Determinants of Health ([place]) Interventions SDOH Screenings   Food Insecurity: No Food Insecurity (10/16/2022)  Housing: Low Risk  (10/11/2021)  Transportation Needs: No Transportation Needs (10/16/2022)  Alcohol Screen: Low Risk  (10/11/2021)  Depression (PHQ2-9): Low Risk  (10/16/2022)  Financial Resource Strain: Low Risk  (10/16/2022)  Physical Activity: Inactive (10/16/2022)  Social Connections: Socially Isolated (10/11/2021)  Stress: No Stress Concern Present (10/16/2022)  Tobacco Use: High Risk (01/01/2023)     Readmission Risk Interventions     No data to display

## 2023-01-07 NOTE — TOC Progression Note (Addendum)
Transition of Care [facility]) - Progression Note    Patient Details  Name: Jordan Kennedy MRN: [id number] Date of Birth: 03-Jan-1956  Transition of Care [facility]) CM/SW [place], LCSW Phone Number: 01/07/2023, 2:51 PM  Clinical Narrative:    1:24pm-CSW spoke with patient's caregiver, [name], to let him know that patient may be at baseline per therapy if he normally uses a walker. [name] reported patient does not use one normally and lives on the second level. CSW explained that per therapy, patient will need supervision and we were not able to trial him on stairs and that patient has an accident in bed. CSW went over SNF with [name].   [name] came to see the patient and asked if someone could walk patient so that he could see how he does. RN assisted to walk patient and [name] stated that he believes patient is doing well enough to be at home and that he just needs familiar surroundings. He stated that he will make sure staff supervise patient during ambulation and toileting. CSW requested he check into what home health agency could be called for patient and patient will need a walker. He declined bedside commode. He will call CSW back after speaking with his supervisor.   CSW discussed plan with Guardian, [name], and she reported agreement with whatever [name] advises as he is patient's main caregiver and knows him best.   Update: [name] contacted CSW back and stated to use whichever home health agency has good reviews. He requested nicotine patch for patient.   RNCM to order walker and home health for patient. MD sending meds to [facility] pharmacy.    Expected Discharge Plan: Skilled Nursing Facility Barriers to Discharge: SNF Pending bed offer, Awaiting State Approval (PASRR), Continued Medical Work up  Expected Discharge Plan and Services In-house Referral: Clinical Social Work     Living arrangements for the past 2 months: Group Home                                       Social  Determinants of Health (SDOH) Interventions SDOH Screenings   Food Insecurity: No Food Insecurity (10/16/2022)  Housing: Low Risk  (10/11/2021)  Transportation Needs: No Transportation Needs (10/16/2022)  Alcohol Screen: Low Risk  (10/11/2021)  Depression (PHQ2-9): Low Risk  (10/16/2022)  Financial Resource Strain: Low Risk  (10/16/2022)  Physical Activity: Inactive (10/16/2022)  Social Connections: Socially Isolated (10/11/2021)  Stress: No Stress Concern Present (10/16/2022)  Tobacco Use: High Risk (01/01/2023)    Readmission Risk Interventions     No data to display

## 2023-01-07 NOTE — NC FL2 (Signed)
[place] LEVEL OF CARE FORM     IDENTIFICATION  Patient Name: Jordan Kennedy Birthdate: Apr 29, 1956 Sex: male Admission Date (Current Location): 01/01/2023  [facility] [place] Number:  Herbalist and Address:  [facility] [place] [address] [place], [place] [address]      Provider Number: [id number]  Attending Physician Name and Address:  Darliss Cheney, MD  Relative Name and Phone Number:       Current Level of Care: Hospital Recommended Level of Care: [place] Prior Approval Number:    Date Approved/Denied:   PASRR Number: Pending  Discharge Plan: SNF    Current Diagnoses: Patient Active Problem List   Diagnosis Date Noted   Acute systolic (congestive) heart failure ([place]) 01/03/2023   CAP (community acquired pneumonia) 01/02/2023   Severe sepsis ([place]) 01/01/2023   Acute respiratory failure with hypoxia ([place]) 01/01/2023   Prolonged QT interval 01/01/2023   Normocytic anemia 01/01/2023   Bronchitis due to tobacco use 01/01/2023   Transaminitis 01/01/2023   Chronic constipation 01/01/2023   Thrombocytosis 01/01/2023   Acute exacerbation of congestive heart failure ([place]) 01/01/2023   Tardive dyskinesia 11/20/2021   Lower urinary tract symptoms (LUTS) 05/02/2021   History of small bowel obstruction 01/31/2021   History of colon polyps 01/31/2021   Onychomycosis 01/31/2021   Gastroesophageal reflux disease 01/31/2021   Constipation due to slow transit 04/25/2012   Anxiety and depression 04/25/2012   Edentulous 04/25/2012   Schizophrenia ([place])    Intellectual disability     Orientation RESPIRATION BLADDER Height & Weight     Self, Place  Normal Incontinent, External catheter Weight: 197 lb 8.5 oz (89.6 kg) Height:  6\' 2"  (188 cm)  BEHAVIORAL SYMPTOMS/MOOD NEUROLOGICAL BOWEL NUTRITION STATUS      Incontinent Diet (See dc summary)  AMBULATORY STATUS COMMUNICATION OF NEEDS Skin   Limited Assist  Verbally Normal                       Personal Care Assistance Level of Assistance  Bathing, Feeding, Dressing Bathing Assistance: Limited assistance Feeding assistance: Limited assistance Dressing Assistance: Limited assistance     Functional Limitations Info             [place]  PT (By licensed PT), OT (By licensed OT)     PT Frequency: 5x/week OT Frequency: 5x/week            Contractures Contractures Info: Not present    Additional Factors Info  Code Status, Allergies, Psychotropic Code Status Info: Full Allergies Info: NKA Psychotropic Info: Ambien; Paxil         Current Medications (01/07/2023):  This is the current hospital active medication list Current Facility-Administered Medications  Medication Dose Route Frequency Provider Last Rate Last Admin   acetaminophen (TYLENOL) tablet 650 mg  650 mg Oral Q6H PRN Fuller Plan A, MD   650 mg at 01/01/23 2146   Or   acetaminophen (TYLENOL) suppository 650 mg  650 mg Rectal Q6H PRN Fuller Plan A, MD       albuterol (PROVENTIL) (2.5 MG/3ML) 0.083% nebulizer solution 2.5 mg  2.5 mg Nebulization Q4H PRN Fuller Plan A, MD   2.5 mg at 01/01/23 1546   amantadine (SYMMETREL) capsule 100 mg  100 mg Oral QHS Smith, Rondell A, MD   100 mg at 01/06/23 2044   aspirin EC tablet 81 mg  81 mg Oral Daily Smith, Rondell A,  MD   81 mg at 01/06/23 0949   clotrimazole (LOTRIMIN) 1 % cream   Topical BID Norval Morton, MD   Given at 01/06/23 2046   cloZAPine (CLOZARIL) tablet 200 mg  200 mg Oral BID Fuller Plan A, MD   200 mg at 01/06/23 2044   dapagliflozin propanediol (FARXIGA) tablet 10 mg  10 mg Oral Daily Janina Mayo, MD   10 mg at 01/06/23 0950   enoxaparin (LOVENOX) injection 40 mg  40 mg Subcutaneous Q24H Smith, Rondell A, MD   40 mg at 01/06/23 1623   furosemide (LASIX) injection 40 mg  40 mg Intravenous BID Darliss Cheney, MD   40 mg at 01/06/23 1623   guaiFENesin (MUCINEX) 12 hr  tablet 600 mg  600 mg Oral BID Fuller Plan A, MD   600 mg at 01/06/23 2044   levalbuterol (XOPENEX) nebulizer solution 0.63 mg  0.63 mg Nebulization BID Darliss Cheney, MD   0.63 mg at 01/07/23 0745   linaclotide (LINZESS) capsule 290 mcg  290 mcg Oral QAC breakfast Fuller Plan A, MD   290 mcg at 01/06/23 [id number]   melatonin tablet 10 mg  10 mg Oral QHS Ventura Sellers, RPH   10 mg at 01/06/23 2044   metoCLOPramide (REGLAN) tablet 5 mg  5 mg Oral BID Fuller Plan A, MD   5 mg at 01/06/23 2044   metoprolol tartrate (LOPRESSOR) tablet 12.5 mg  12.5 mg Oral BID Lily Kocher, PA-C   12.5 mg at 01/06/23 2044   mirabegron ER (MYRBETRIQ) tablet 50 mg  50 mg Oral Daily Smith, Rondell A, MD   50 mg at 01/06/23 0950   nicotine (NICODERM CQ - dosed in mg/24 hours) patch 14 mg  14 mg Transdermal Daily Fuller Plan A, MD   14 mg at 01/06/23 0949   Oral care mouth rinse  15 mL Mouth Rinse PRN Darliss Cheney, MD       pantoprazole (PROTONIX) EC tablet 40 mg  40 mg Oral QPM Smith, Rondell A, MD   40 mg at 01/06/23 1623   PARoxetine (PAXIL) tablet 40 mg  40 mg Oral QPM Smith, Rondell A, MD   40 mg at 01/06/23 1623   sacubitril-valsartan (ENTRESTO) 24-26 mg per tablet  1 tablet Oral BID Janina Mayo, MD   1 tablet at 01/06/23 2044   senna-docusate (Senokot-S) tablet 1 tablet  1 tablet Oral BID Fuller Plan A, MD   1 tablet at 01/04/23 0931   sodium chloride flush (NS) 0.9 % injection 3 mL  3 mL Intravenous Q12H Smith, Rondell A, MD   3 mL at 01/06/23 2045   spironolactone (ALDACTONE) tablet 12.5 mg  12.5 mg Oral Daily Janina Mayo, MD   12.5 mg at 01/06/23 0950   tamsulosin (FLOMAX) capsule 0.4 mg  0.4 mg Oral Daily Tamala Julian, Rondell A, MD   0.4 mg at 01/06/23 [id number]   trimethobenzamide (TIGAN) injection 200 mg  200 mg Intramuscular Q6H PRN Fuller Plan A, MD       zolpidem (AMBIEN) tablet 10 mg  10 mg Oral QHS Fuller Plan A, MD   10 mg at 01/06/23 2044     Discharge Medications: Please see  discharge summary for a list of discharge medications.  Relevant Imaging Results:  Relevant Lab Results:   Additional Information SSN#  [id number]  Benard Halsted, LCSW

## 2023-01-07 NOTE — Progress Notes (Signed)
RE: Jordan Kennedy Date of Birth: 05/12/1956 Date: 01/07/23  Please be advised that the above-named patient will require a short-term nursing home stay - anticipated 30 days or less for rehabilitation and strengthening.  The plan is for return home.

## 2023-01-07 NOTE — Care Management Important Message (Signed)
Important Message  Patient Details  Name: Jordan Kennedy MRN: [id number] Date of Birth: [birth year]   Medicare Important Message Given:  Yes     Klayten Jolliff Montine Circle 01/07/2023, 2:46 PM

## 2023-01-07 NOTE — TOC Transition Note (Signed)
Transition of Care [facility]) - CM/SW Discharge Note   Patient Details  Name: Jordan Kennedy MRN: [id number] Date of Birth: January 22, 1956  [facility]) CM/SW Contact:  Levonne Lapping, RN Phone Number: 01/07/2023, 3:50 PM   Clinical Narrative:   Patient now [place] to Home with [place].  CM was contacted by CSW with update.  Rolling walker has been ordered thru [employer] and will be delivered to room prior to DC. Home Health has been accepted by [facility] for HHPT and OT .       Barriers to Discharge: SNF Pending bed offer, Awaiting State Approval (PASRR), Continued Medical Work up   Patient Goals and CMS Choice CMS Medicare.gov Compare Post Acute Care list provided to:: Patient Represenative (must comment)    Discharge Placement                         Discharge Plan and Services Additional resources added to the After Visit Summary for   In-house Referral: Clinical Social Work                                   Social Determinants of Health (SDOH) Interventions SDOH Screenings   Food Insecurity: No Food Insecurity (10/16/2022)  Housing: Low Risk  (10/11/2021)  Transportation Needs: No Transportation Needs (10/16/2022)  Alcohol Screen: Low Risk  (10/11/2021)  Depression (PHQ2-9): Low Risk  (10/16/2022)  Financial Resource Strain: Low Risk  (10/16/2022)  Physical Activity: Inactive (10/16/2022)  Social Connections: Socially Isolated (10/11/2021)  Stress: No Stress Concern Present (10/16/2022)  Tobacco Use: High Risk (01/01/2023)     Readmission Risk Interventions     No data to display

## 2023-01-07 NOTE — Progress Notes (Addendum)
Rounding Note    Patient Name: Jordan Kennedy Date of Encounter: 01/07/2023  [place] Cardiologist: Berniece Salines, DO   Subjective   Pt did not answer any questions, he needs to be cleaned up after an accident in bed  Inpatient Medications    Scheduled Meds:  amantadine  100 mg Oral QHS   aspirin EC  81 mg Oral Daily   clotrimazole   Topical BID   cloZAPine  200 mg Oral BID   dapagliflozin propanediol  10 mg Oral Daily   enoxaparin (LOVENOX) injection  40 mg Subcutaneous Q24H   furosemide  40 mg Intravenous BID   guaiFENesin  600 mg Oral BID   levalbuterol  0.63 mg Nebulization BID   linaclotide  290 mcg Oral QAC breakfast   melatonin  10 mg Oral QHS   metoCLOPramide  5 mg Oral BID   metoprolol tartrate  12.5 mg Oral BID   mirabegron ER  50 mg Oral Daily   nicotine  14 mg Transdermal Daily   pantoprazole  40 mg Oral QPM   PARoxetine  40 mg Oral QPM   sacubitril-valsartan  1 tablet Oral BID   senna-docusate  1 tablet Oral BID   sodium chloride flush  3 mL Intravenous Q12H   spironolactone  12.5 mg Oral Daily   tamsulosin  0.4 mg Oral Daily   zolpidem  10 mg Oral QHS   Continuous Infusions:  PRN Meds: acetaminophen **OR** acetaminophen, albuterol, mouth rinse, trimethobenzamide   Vital Signs    Vitals:   01/07/23 0030 01/07/23 0300 01/07/23 0745 01/07/23 0800  BP: 96/69     Pulse: 80     Resp: 17  16   Temp:    98.9 F (37.2 C)  TempSrc:    Oral  SpO2: 98%     Weight:  89.6 kg    Height:        Intake/Output Summary (Last 24 hours) at 01/07/2023 1013 Last data filed at 01/06/2023 1700 Gross per 24 hour  Intake 960 ml  Output --  Net 960 ml      01/07/2023    3:00 AM 01/05/2023    4:15 PM 01/05/2023    7:35 AM  Last 3 Weights  Weight (lbs) 197 lb 8.5 oz 195 lb 1.7 oz 195 lb 1.7 oz  Weight (kg) 89.6 kg 88.5 kg 88.5 kg      Telemetry    Sinus rhythm HR 70-80s, PVC, short NSVT - Personally Reviewed  ECG    No new tracings -  Personally Reviewed  Physical Exam   GEN: No acute distress.   Neck: No JVD Cardiac: RRR, no murmurs, rubs, or gallops.  Respiratory: Clear to auscultation bilaterally. GI: Soft, nontender, non-distended  MS: No edema; No deformity. Neuro:  Nonfocal  Psych: Normal affect   Labs    High Sensitivity Troponin:   Recent Labs  Lab 01/03/23 0838 01/03/23 1212 01/03/23 1604  TROPONINIHS 30* 28* 26*     Chemistry Recent Labs  Lab 01/02/23 0434 01/03/23 0221 01/04/23 1113 01/05/23 0321 01/06/23 0353 01/07/23 0813  NA 139 137 140 139 139 136  K 4.2 4.1 3.9 4.2 3.9 4.1  CL 104 104 104 103 103 102  CO2 23 23 25 27 27 27   GLUCOSE 165* 169* 116* 128* 131* 120*  BUN 13 23 15 15 20 22   CREATININE 1.01 1.10 0.83 0.83 0.96 1.15  CALCIUM 8.6* 8.5* 8.9 8.7* 8.7* 8.3*  MG 2.3 2.1  --   --   --   --  PROT 5.9* 5.6* 6.3* 5.7*  --   --   ALBUMIN 2.4* 2.2* 2.5* 2.2*  --   --   AST 49* 91* 69* 52*  --   --   ALT 131* 143* 240* 200*  --   --   ALKPHOS 189* 172* 187* 177*  --   --   BILITOT 0.5 0.7 0.5 0.5  --   --   GFRNONAA >60 >60 >60 >60 >60 >60  ANIONGAP 12 10 11 9 9 7     Lipids No results for input(s): "CHOL", "TRIG", "HDL", "LABVLDL", "LDLCALC", "CHOLHDL" in the last 168 hours.  Hematology Recent Labs  Lab 01/05/23 0321 01/06/23 0353 01/07/23 0813  WBC 16.3* 19.0* 23.5*  RBC 4.02* 4.17* 4.40  HGB 12.6* 12.8* 13.6  HCT 38.0* 39.4 40.9  MCV 94.5 94.5 93.0  MCH 31.3 30.7 30.9  MCHC 33.2 32.5 33.3  RDW 15.9* 15.9* 15.5  PLT 544* 604* 615*   Thyroid  Recent Labs  Lab 01/04/23 1113  TSH 0.850    BNP Recent Labs  Lab 01/01/23 1620  BNP 985.0*    DDimer No results for input(s): "DDIMER" in the last 168 hours.   Radiology    No results found.  Cardiac Studies   Echo 01/02/23: 1. Left ventricular ejection fraction, by estimation, is 25 to 30%. The  left ventricle has severely decreased function. The left ventricle  demonstrates regional wall motion  abnormalities (see scoring  diagram/findings for description). There is mild  asymmetric left ventricular hypertrophy of the infero-lateral segment.  Left ventricular diastolic parameters are indeterminate.   2. Right ventricular systolic function is mildly reduced. The right  ventricular size is normal. There is normal pulmonary artery systolic  pressure. The estimated right ventricular systolic pressure is [id number] mmHg.   3. Left atrial size was moderately dilated.   4. The mitral valve is grossly normal. Moderate to severe mitral valve  regurgitation with splay artifact, suggesting this may be underestimated.  No evidence of mitral stenosis.   5. The aortic valve is tricuspid. Aortic valve regurgitation is not  visualized. No aortic stenosis is present.   6. The inferior vena cava is normal in size with greater than 50%  respiratory variability, suggesting right atrial pressure of 3 mmHg.    Patient Profile     67 y.o. male with a hx of cognitive impairment, schizophrenia residing in group home, tardive dyskinesia, constipation, SBO, anemia, tobacco abuse who is being seen for the evaluation of LV dysfunction    Assessment & Plan    Acute systolic heart failure Acute respiratory failure  Pneumonia  Echo with LVEF 25-30% + WMA anterior septum, mid inferoseptal segment and basal inferoseptal segment akinetic, apical septal segment hypokinetic - CE mild and flat - in the setting of PNA with acute respiratory failure - IV diuresis with 40 mg IV lasix BID - GDMT: 10 mg farxiga, 24-26 mg entresto, 12.5 mg spironolactone - volume status is difficult, did not fully participate in exam - consider reducing lasix to daily - will need BMP in 1 week - plan to repeat an echo in 3 months on GDMT   Moderate to severe MR May be under-estimated Will monitor with repeat echo    Baseline wandering pacemaker Prolong QTC No syncope Will avoid cardiac medications that prolong  QT   Disposition [place] department of social services is his legal guardian Resides in a group home Poor historian      For questions or  updates, please contact [place] Please consult [email or web address] for contact info under        Signed, Ledora Bottcher, PA  01/07/2023, 10:13 AM    Personally seen and examined. Agree with above.  EF 25 to 30% currently undergoing IV diuresis with 40 mg twice daily as well as goal-directed medical therapy Lisabeth Register spironolactone  Challenging to completely understand volume status however weight is approximately 195 pounds down from 204 pounds on admission.  -8 L out in total.  I think would be reasonable to transition him over to p.o. Lasix at this point.  We will switch him over to Lasix 40 mg daily.  Candee Furbish, MD

## 2023-01-07 NOTE — Discharge Summary (Signed)
Physician Discharge Summary  Jordan Kennedy [id number] DOB: 1955-12-09 DOA: 01/01/2023  PCP: Haydee Salter, MD  Admit date: 01/01/2023 Discharge date: 01/07/2023 30 Day Unplanned Readmission Risk Score    Flowsheet Row ED to Hosp-Admission (Current) from 01/01/2023 in [place] [facility]  30 Day Unplanned Readmission Risk Score (%) 20.15 Filed at 01/07/2023 1200       This score is the patient's risk of an unplanned readmission within 30 days of being discharged (0 -100%). The score is based on dignosis, age, lab data, medications, orders, and past utilization.   Low:  0-14.9   Medium: 15-21.9   High: 22-29.9   Extreme: 30 and above          Admitted From: Group home Disposition: Group home  Recommendations for Outpatient Follow-up:  Follow up with PCP in 1-2 weeks Please obtain BMP/CBC in one week Please follow up with your PCP on the following pending results: Unresulted Labs (From admission, onward)     Start     Ordered   01/01/23 1709  MRSA Next Gen by PCR, Nasal  Once,   R        01/01/23 1708              Home Health: Yes Equipment/Devices: None  Discharge Condition: Stable CODE STATUS: Full code Diet recommendation: Cardiac  Subjective: Seen and examined.  Feels well.  No shortness of breath.  Brief/Interim Summary: Jordan Kennedy is a 67 y.o. male with medical history significant of cognitive impairment, schizophrenia, tardive dyskinesia, chronic constipation, with prior history of SBO who presented with complaints of cough and shortness of breath.  At group home, reportedly, he was checked for COVID-19 which was negative at that time and was thought to have a viral upper respiratory infection which symptomatic treatment was recommended.  His shortness of breath got worse so he was brought into the ED.   In the emergency department patient was noted to be febrile up to 101.4 F with tachycardia, tachypnea, and O2 saturation is as low as  84% on room air with improvement on nonrebreather.  Labs significant for WBC 20.5, hemoglobin 11.7, platelet 490, calcium 8.3, alkaline phosphatase 198, AST 59, and ALT 137.  Chest x-ray concerning for bilateral perihilar airspace disease right greater than left concerning for asymmetric pulmonary edema or diffuse infection.  Patient had initially been given given Rocephin, azithromycin, and Ativan 1 mg p.o. admitted to hospital service.  Details below.   Acute respiratory failure with hypoxia and severe sepsis secondary to community-acquired pneumonia as well as acute systolic congestive heart failure: Noted to be febrile up to 101.4 F with tachycardia, tachypnea, and white blood cell count elevated 20.5 and acute hypoxic respiratory failure.  Saturations noted to be as low as 84% on room air and required nonrebreather but only requiring 4 L of oxygen and maintaining saturation around 92%. chest x-ray noted bilateral perihilar airspace disease right greater than left with new interval opacities concerning for pulmonary edema or diffuse infection.  Procalcitonin 0.37 but BNP significantly elevated at 985 indicates that he has both acute on chronic congestive heart failure and possibly bacterial pneumonia but more so CHF.  Echo shows 25 to 30% ejection fraction which appears to be new.  Troponin slightly elevated but flat indicating demand ischemia.  Seen by cardiology, given his inability to follow directions, they decided to pursue guideline medical therapy and follow-up patient clinically with no intervention planned and to repeat echo as outpatient  in 3 months.  He completed 5 days of Rocephin and doxycycline.  He received IV Lasix for the time that he was hospitalized.  Cardiology started him on Aldactone, Entresto and Farxiga.  He is net negative almost 8.3 L since admission.  Has been stable on room air since yesterday and passed ambulatory oximetry as well and did not require any oxygen.  Has been  transitioned to oral Lasix and has been cleared by cardiology today.  Seen by PT OT yesterday who recommended SNF however I have been informed by our TOC and patient's primary nurse that patient's group home representative came in today and basically said that they would like to take him back to the group home.  TOC also confirmed with the patient's state guardian who was in agreement with this plan for the patient to return to the group home instead of going to the SNF.  Based on their request, we are discharging him.    Possible bronchitis due to tobacco use: Patient was noted to be wheezing on physical exam at the time of admission.  Patient does have a history of smoking tobacco.  No formal pulmonary function test on file.  He had been given Decadron IV in the emergency department.  He was started on prednisone.  Completed 5 days of prednisone as well.   Prolonged QT interval Acute.  QTc was noted to be prolonged at 532, but patient was noted to be tachycardic at that time. -Avoid QT prolonging medications.     Normocytic anemia Hemoglobin stable.   Transaminitis/elevated LFTs Acute.  On admission alkaline phosphatase 198, AST 59, and ALT 137.  Viral hepatitis panel negative.  LFTs improving.   Intellectual disability Patient currently lives at a group home due to cognitive impairment. Continue sitter   Schizophrenia, undifferentiated type -Clozapine deferred  due to prolonged QT interval   Thrombocytosis Acute.  Platelet count elevated at 490.  Thought reactive secondary to above.  Continues to get worse.  Repeat CBC in a week.  Discharge plan was discussed with patient and/or family member and they verbalized understanding and agreed with it.  Discharge Diagnoses:  Principal Problem:   Severe sepsis ([place]) Active Problems:   Acute respiratory failure with hypoxia (HCC)   Bronchitis due to tobacco use   Prolonged QT interval   Normocytic anemia   Transaminitis   Intellectual  disability   Schizophrenia (HCC)   Thrombocytosis   Chronic constipation   Acute exacerbation of congestive heart failure (HCC)   CAP (community acquired pneumonia)   Acute systolic (congestive) heart failure ([place])    Discharge Instructions   Allergies as of 01/07/2023   No Known Allergies      Medication List     TAKE these medications    amantadine 100 MG capsule Commonly known as: SYMMETREL Take 100 mg by mouth at bedtime.   aspirin EC 81 MG tablet Commonly known as: GNP Aspirin Take 1 tablet (81 mg total) by mouth daily.   clotrimazole 1 % cream Commonly known as: LOTRIMIN Apply topically 2 (two) times daily.   cloZAPine 100 MG tablet Commonly known as: CLOZARIL Take 2 tablets (200 mg total) by mouth 2 (two) times daily.   dapagliflozin propanediol 10 MG Tabs tablet Commonly known as: FARXIGA Take 1 tablet (10 mg total) by mouth daily. Start taking on: January 08, 2023   furosemide 40 MG tablet Commonly known as: LASIX Take 1 tablet (40 mg total) by mouth daily. Start taking on: January 08, 2023   Linzess 290 MCG Caps capsule Generic drug: linaclotide TAKE ONE CAPSULE EACH DAY BEFORE BREAKFAST What changed: See the new instructions.   Melatonin 10 MG Subl Place 10 mg under the tongue at bedtime.   metoCLOPramide 5 MG tablet Commonly known as: REGLAN TAKE ONE TABLET TWICE DAILY What changed: when to take this   metoprolol tartrate 25 MG tablet Commonly known as: LOPRESSOR Take 0.5 tablets (12.5 mg total) by mouth 2 (two) times daily.   Myrbetriq 50 MG Tb24 tablet Generic drug: mirabegron ER Take 50 mg by mouth at bedtime.   nicotine 14 mg/24hr patch Commonly known as: NICODERM CQ - dosed in mg/24 hours Place 1 patch (14 mg total) onto the skin daily. Start taking on: January 08, 2023   omeprazole 40 MG capsule Commonly known as: PRILOSEC TAKE ONE CAPSULE EVERY EVENING What changed: See the new instructions.   PARoxetine 40 MG  tablet Commonly known as: PAXIL Take 40 mg by mouth every evening.   polyethylene glycol powder 17 GM/SCOOP powder Commonly known as: GLYCOLAX/MIRALAX 17G IN 8OZ OF FLUID OF CHOICE MONDAY AND THURSDAY What changed: See the new instructions.   sacubitril-valsartan 24-26 MG Commonly known as: ENTRESTO Take 1 tablet by mouth 2 (two) times daily.   Senna Plus 8.6-50 MG tablet Generic drug: senna-docusate TAKE ONE TABLET TWICE DAILY   spironolactone 25 MG tablet Commonly known as: ALDACTONE Take 0.5 tablets (12.5 mg total) by mouth daily. Start taking on: January 08, 2023   tamsulosin 0.4 MG Caps capsule Commonly known as: FLOMAX Take 0.4 mg by mouth at bedtime.   zolpidem 10 MG tablet Commonly known as: AMBIEN Take 10 mg by mouth at bedtime.               Durable Medical Equipment  (From admission, onward)           Start     Ordered   01/07/23 1542  For home use only DME Walker rolling  Once       Question Answer Comment  Walker: With 5 Inch Wheels   Patient needs a walker to treat with the following condition Septicemia [facility])   Patient needs a walker to treat with the following condition Unsteady gait      01/07/23 1543            Follow-up Information     Haydee Salter, MD Follow up in 1 week(s).   Specialty: Family Medicine Contact information: [place] [address] [phone]                No Known Allergies  Consultations: Cardiology   Procedures/Studies: DG CHEST PORT 1 VIEW  Result Date: 01/03/2023 CLINICAL DATA:  CHF EXAM: PORTABLE CHEST 1 VIEW COMPARISON:  Radiograph 01/01/2023 FINDINGS: Unchanged cardiomediastinal silhouette. There is persistent bilateral airspace disease, decreased in the right lung in comparison to prior. No large effusion or evidence of pneumothorax. Bones are unchanged. IMPRESSION: Persistent bilateral airspace disease, decreased on the right in comparison prior exam,  compatible with edema or infection. Electronically Signed   By: Maurine Simmering M.D.   On: 01/03/2023 11:54   ECHOCARDIOGRAM COMPLETE  Result Date: 01/02/2023    ECHOCARDIOGRAM REPORT   Patient Name:   KIMOTHY LEONHART Date of Exam: 01/02/2023 Medical Rec #:  [id number]      Height:       74.0 in Accession #:    [id number]     Weight:  204.0 lb Date of Birth:  21-Aug-1956      BSA:          2.192 m Patient Age:    67 years       BP:           118/83 mmHg Patient Gender: M              HR:           101 bpm. Exam Location:  Inpatient Procedure: 2D Echo, Cardiac Doppler, Color Doppler and Intracardiac            Opacification Agent Indications:    CHF  History:        Patient has no prior history of Echocardiogram examinations.                 CHF; Risk Factors:Current Smoker.  Sonographer:    Wenda Low Referring Phys: [id number] RONDELL A SMITH IMPRESSIONS  1. Left ventricular ejection fraction, by estimation, is 25 to 30%. The left ventricle has severely decreased function. The left ventricle demonstrates regional wall motion abnormalities (see scoring diagram/findings for description). There is mild asymmetric left ventricular hypertrophy of the infero-lateral segment. Left ventricular diastolic parameters are indeterminate.  2. Right ventricular systolic function is mildly reduced. The right ventricular size is normal. There is normal pulmonary artery systolic pressure. The estimated right ventricular systolic pressure is Q000111Q mmHg.  3. Left atrial size was moderately dilated.  4. The mitral valve is grossly normal. Moderate to severe mitral valve regurgitation with splay artifact, suggesting this may be underestimated. No evidence of mitral stenosis.  5. The aortic valve is tricuspid. Aortic valve regurgitation is not visualized. No aortic stenosis is present.  6. The inferior vena cava is normal in size with greater than 50% respiratory variability, suggesting right atrial pressure of 3 mmHg. FINDINGS  Left  Ventricle: Left ventricular ejection fraction, by estimation, is 25 to 30%. The left ventricle has severely decreased function. The left ventricle demonstrates regional wall motion abnormalities. Definity contrast agent was given IV to delineate the left ventricular endocardial borders. The left ventricular internal cavity size was normal in size. There is mild asymmetric left ventricular hypertrophy of the infero-lateral segment. Left ventricular diastolic parameters are indeterminate.  LV Wall Scoring: The anterior septum, mid inferoseptal segment, and basal inferoseptal segment are akinetic. The apical septal segment is hypokinetic. Right Ventricle: The right ventricular size is normal. No increase in right ventricular wall thickness. Right ventricular systolic function is mildly reduced. There is normal pulmonary artery systolic pressure. The tricuspid regurgitant velocity is 2.12 m/s, and with an assumed right atrial pressure of 3 mmHg, the estimated right ventricular systolic pressure is Q000111Q mmHg. Left Atrium: Left atrial size was moderately dilated. Right Atrium: Right atrial size was normal in size. Pericardium: There is no evidence of pericardial effusion. Mitral Valve: The mitral valve is grossly normal. Moderate to severe mitral valve regurgitation. No evidence of mitral valve stenosis. MV peak gradient, 8.2 mmHg. The mean mitral valve gradient is 2.0 mmHg. Tricuspid Valve: The tricuspid valve is normal in structure. Tricuspid valve regurgitation is trivial. No evidence of tricuspid stenosis. Aortic Valve: The aortic valve is tricuspid. Aortic valve regurgitation is not visualized. No aortic stenosis is present. Aortic valve mean gradient measures 4.0 mmHg. Aortic valve peak gradient measures 7.7 mmHg. Aortic valve area, by VTI measures 2.22 cm. Pulmonic Valve: The pulmonic valve was normal in structure. Pulmonic valve regurgitation is trivial. No evidence of pulmonic stenosis.  Aorta: The aortic root is  normal in size and structure. Venous: The inferior vena cava is normal in size with greater than 50% respiratory variability, suggesting right atrial pressure of 3 mmHg. IAS/Shunts: No atrial level shunt detected by color flow Doppler.  LEFT VENTRICLE PLAX 2D LVIDd:         6.00 cm LVIDs:         5.40 cm LV PW:         1.20 cm LV IVS:        0.90 cm LVOT diam:     2.00 cm LV SV:         59 LV SV Index:   27 LVOT Area:     3.14 cm  RIGHT VENTRICLE RV Basal diam:  4.90 cm TAPSE (M-mode): 2.2 cm LEFT ATRIUM              Index        RIGHT ATRIUM           Index LA diam:        4.70 cm  2.14 cm/m   RA Area:     18.80 cm LA Vol (A2C):   85.5 ml  39.01 ml/m  RA Volume:   58.50 ml  26.69 ml/m LA Vol (A4C):   99.5 ml  45.40 ml/m LA Biplane Vol: 102.0 ml 46.54 ml/m  AORTIC VALVE                    PULMONIC VALVE AV Area (Vmax):    2.10 cm     PV Vmax:       0.93 m/s AV Area (Vmean):   1.95 cm     PV Peak grad:  3.4 mmHg AV Area (VTI):     2.22 cm AV Vmax:           139.00 cm/s AV Vmean:          95.200 cm/s AV VTI:            0.267 m AV Peak Grad:      7.7 mmHg AV Mean Grad:      4.0 mmHg LVOT Vmax:         93.10 cm/s LVOT Vmean:        59.200 cm/s LVOT VTI:          0.189 m LVOT/AV VTI ratio: 0.71  AORTA Ao Root diam: 3.40 cm MITRAL VALVE               TRICUSPID VALVE MV Area (PHT): 3.79 cm    TR Peak grad:   18.0 mmHg MV Area VTI:   2.88 cm    TR Vmax:        212.00 cm/s MV Peak grad:  8.2 mmHg MV Mean grad:  2.0 mmHg    SHUNTS MV Vmax:       1.43 m/s    Systemic VTI:  0.19 m MV Vmean:      66.8 cm/s   Systemic Diam: 2.00 cm MV Decel Time: 200 msec MR Peak grad: 76.0 mmHg MR Mean grad: 44.0 mmHg MR Vmax:      436.00 cm/s MR Vmean:     308.0 cm/s MV E velocity: 94.30 cm/s Cherlynn Kaiser MD Electronically signed by Cherlynn Kaiser MD Signature Date/Time: 01/02/2023/2:57:54 PM    Final    DG Chest 1 View  Result Date: 01/01/2023 CLINICAL DATA:  Shortness of breath and trouble breathing. EXAM: CHEST  1 VIEW  COMPARISON:  06/29/2018 FINDINGS: The cardio pericardial silhouette is enlarged. Bilateral parahilar airspace disease, right greater than left, is new in the interval. Possible trace bilateral pleural effusions. The visualized bony structures of the thorax are unremarkable. IMPRESSION: Bilateral parahilar airspace disease, right greater than left, is new in the interval. Imaging features compatible with asymmetric pulmonary edema or diffuse infection. Electronically Signed   By: Misty Stanley M.D.   On: 01/01/2023 11:32     Discharge Exam: Vitals:   01/07/23 0745 01/07/23 0800  BP:    Pulse:    Resp: 16   Temp:  98.9 F (37.2 C)  SpO2:     Vitals:   01/07/23 0030 01/07/23 0300 01/07/23 0745 01/07/23 0800  BP: 96/69     Pulse: 80     Resp: 17  16   Temp:    98.9 F (37.2 C)  TempSrc:    Oral  SpO2: 98%     Weight:  89.6 kg    Height:        General: Pt is alert, awake, not in acute distress Cardiovascular: RRR, S1/S2 +, no rubs, no gallops Respiratory: CTA bilaterally, no wheezing, no rhonchi Abdominal: Soft, NT, ND, bowel sounds + Extremities: no edema, no cyanosis    The results of significant diagnostics from this hospitalization (including imaging, microbiology, ancillary and laboratory) are listed below for reference.     Microbiology: Recent Results (from the past 240 hour(s))  Resp panel by RT-PCR (RSV, Flu A&B, Covid) Anterior Nasal Swab     Status: None   Collection Time: 01/01/23 10:54 AM   Specimen: Anterior Nasal Swab  Result Value Ref Range Status   SARS Coronavirus 2 by RT PCR NEGATIVE NEGATIVE Final   Influenza A by PCR NEGATIVE NEGATIVE Final   Influenza B by PCR NEGATIVE NEGATIVE Final    Comment: (NOTE) The Xpert Xpress SARS-CoV-2/FLU/RSV plus assay is intended as an aid in the diagnosis of influenza from Nasopharyngeal swab specimens and should not be used as a sole basis for treatment. Nasal washings and aspirates are unacceptable for Xpert Xpress  SARS-CoV-2/FLU/RSV testing.  Fact Sheet for Patients: EntrepreneurPulse.com.au  Fact Sheet for Healthcare Providers: IncredibleEmployment.be  This test is not yet approved or cleared by the [place] FDA and has been authorized for detection and/or diagnosis of SARS-CoV-2 by FDA under an Emergency Use Authorization (EUA). This EUA will remain in effect (meaning this test can be used) for the duration of the COVID-19 declaration under Section 564(b)(1) of the Act, 21 U.S.C. section 360bbb-3(b)(1), unless the authorization is terminated or revoked.     Resp Syncytial Virus by PCR NEGATIVE NEGATIVE Final    Comment: (NOTE) Fact Sheet for Patients: EntrepreneurPulse.com.au  Fact Sheet for Healthcare Providers: IncredibleEmployment.be  This test is not yet approved or cleared by the [place] FDA and has been authorized for detection and/or diagnosis of SARS-CoV-2 by FDA under an Emergency Use Authorization (EUA). This EUA will remain in effect (meaning this test can be used) for the duration of the COVID-19 declaration under Section 564(b)(1) of the Act, 21 U.S.C. section 360bbb-3(b)(1), unless the authorization is terminated or revoked.  Performed at [facility], [place] [address], [place], [place] [address]   Respiratory (~20 pathogens) panel by PCR     Status: None   Collection Time: 01/01/23 11:21 AM   Specimen: Nasopharyngeal Swab; Respiratory  Result Value Ref Range Status   Adenovirus NOT DETECTED NOT DETECTED Final   Coronavirus 229E  NOT DETECTED NOT DETECTED Final    Comment: (NOTE) The Coronavirus on the Respiratory Panel, DOES NOT test for the novel  Coronavirus (2019 nCoV)    Coronavirus HKU1 NOT DETECTED NOT DETECTED Final   Coronavirus NL63 NOT DETECTED NOT DETECTED Final   Coronavirus OC43 NOT DETECTED NOT DETECTED Final   Metapneumovirus NOT DETECTED NOT DETECTED Final    Rhinovirus / Enterovirus NOT DETECTED NOT DETECTED Final   Influenza A NOT DETECTED NOT DETECTED Final   Influenza B NOT DETECTED NOT DETECTED Final   Parainfluenza Virus 1 NOT DETECTED NOT DETECTED Final   Parainfluenza Virus 2 NOT DETECTED NOT DETECTED Final   Parainfluenza Virus 3 NOT DETECTED NOT DETECTED Final   Parainfluenza Virus 4 NOT DETECTED NOT DETECTED Final   Respiratory Syncytial Virus NOT DETECTED NOT DETECTED Final   Bordetella pertussis NOT DETECTED NOT DETECTED Final   Bordetella Parapertussis NOT DETECTED NOT DETECTED Final   Chlamydophila pneumoniae NOT DETECTED NOT DETECTED Final   Mycoplasma pneumoniae NOT DETECTED NOT DETECTED Final    Comment: Performed at [facility], [place] [address], [place], [place] [address]  Culture, blood (Routine X 2) w Reflex to ID Panel     Status: None   Collection Time: 01/01/23  4:43 PM   Specimen: BLOOD  Result Value Ref Range Status   Specimen Description BLOOD RIGHT ANTECUBITAL  Final   Special Requests   Final    BOTTLES DRAWN AEROBIC AND ANAEROBIC Blood Culture adequate volume   Culture   Final    NO GROWTH 5 DAYS Performed at [facility], [place] [address], [place], [place] [address]    Report Status 01/06/2023 FINAL  Final  Culture, blood (Routine X 2) w Reflex to ID Panel     Status: None   Collection Time: 01/01/23  8:55 PM   Specimen: BLOOD  Result Value Ref Range Status   Specimen Description BLOOD LEFT ANTECUBITAL  Final   Special Requests Blood Culture adequate volume  Final   Culture   Final    NO GROWTH 5 DAYS Performed at [facility], [place] [address], [place], [place] [address]    Report Status 01/06/2023 FINAL  Final     Labs: BNP (last 3 results) Recent Labs    01/01/23 1620  BNP [id number]*   Basic Metabolic Panel: Recent Labs  Lab 01/02/23 0434 01/03/23 0221 01/04/23 1113 01/05/23 0321 01/06/23 0353 01/07/23 0813  NA 139 137 140 139 139 136  K 4.2 4.1 3.9 4.2 3.9 4.1  CL 104 104  104 103 103 102  CO2 23 23 25 27 27 27   GLUCOSE 165* 169* 116* 128* 131* 120*  BUN 13 23 15 15 20 22   CREATININE 1.01 1.10 0.83 0.83 0.96 1.15  CALCIUM 8.6* 8.5* 8.9 8.7* 8.7* 8.3*  MG 2.3 2.1  --   --   --   --    Liver Function Tests: Recent Labs  Lab 01/01/23 1055 01/02/23 0434 01/03/23 0221 01/04/23 1113 01/05/23 0321  AST 59* 49* 91* 69* 52*  ALT 137* 131* 143* 240* 200*  ALKPHOS 198* 189* 172* 187* 177*  BILITOT 0.5 0.5 0.7 0.5 0.5  PROT 5.7* 5.9* 5.6* 6.3* 5.7*  ALBUMIN 2.7* 2.4* 2.2* 2.5* 2.2*   No results for input(s): "LIPASE", "AMYLASE" in the last 168 hours. No results for input(s): "AMMONIA" in the last 168 hours. CBC: Recent Labs  Lab 01/03/23 0221 01/04/23 1113 01/05/23 0321 01/06/23 0353 01/07/23  0813  WBC 21.2* 19.2* 16.3* 19.0* 23.5*  NEUTROABS 17.2* 14.6* 12.6* 13.6* 16.6*  HGB 12.5* 13.2 12.6* 12.8* 13.6  HCT 38.0* 41.7 38.0* 39.4 40.9  MCV 94.1 95.2 94.5 94.5 93.0  PLT 409* 563* 544* 604* 615*   Cardiac Enzymes: No results for input(s): "CKTOTAL", "CKMB", "CKMBINDEX", "TROPONINI" in the last 168 hours. BNP: Invalid input(s): "POCBNP" CBG: Recent Labs  Lab 01/01/23 1622  GLUCAP 134*   D-Dimer No results for input(s): "DDIMER" in the last 72 hours. Hgb A1c No results for input(s): "HGBA1C" in the last 72 hours. Lipid Profile No results for input(s): "CHOL", "HDL", "LDLCALC", "TRIG", "CHOLHDL", "LDLDIRECT" in the last 72 hours. Thyroid function studies No results for input(s): "TSH", "T4TOTAL", "T3FREE", "THYROIDAB" in the last 72 hours.  Invalid input(s): "FREET3" Anemia work up No results for input(s): "VITAMINB12", "FOLATE", "FERRITIN", "TIBC", "IRON", "RETICCTPCT" in the last 72 hours. Urinalysis    Component Value Date/Time   COLORURINE YELLOW 07/10/2018 1022   APPEARANCEUR CLEAR 07/10/2018 1022   LABSPEC 1.010 07/10/2018 1022   PHURINE 7.0 07/10/2018 1022   GLUCOSEU NEGATIVE 07/10/2018 1022   HGBUR TRACE (A) 07/10/2018 1022    BILIRUBINUR NEGATIVE 07/10/2018 1022   BILIRUBINUR Negative 01/31/2015 1010   KETONESUR NEGATIVE 07/10/2018 1022   PROTEINUR NEGATIVE 07/10/2018 1022   UROBILINOGEN 0.2 01/31/2015 1010   UROBILINOGEN 1.0 06/12/2012 1315   NITRITE NEGATIVE 07/10/2018 1022   LEUKOCYTESUR TRACE (A) 07/10/2018 1022   Sepsis Labs Recent Labs  Lab 01/04/23 1113 01/05/23 0321 01/06/23 0353 01/07/23 0813  WBC 19.2* 16.3* 19.0* 23.5*   Microbiology Recent Results (from the past 240 hour(s))  Resp panel by RT-PCR (RSV, Flu A&B, Covid) Anterior Nasal Swab     Status: None   Collection Time: 01/01/23 10:54 AM   Specimen: Anterior Nasal Swab  Result Value Ref Range Status   SARS Coronavirus 2 by RT PCR NEGATIVE NEGATIVE Final   Influenza A by PCR NEGATIVE NEGATIVE Final   Influenza B by PCR NEGATIVE NEGATIVE Final    Comment: (NOTE) The Xpert Xpress SARS-CoV-2/FLU/RSV plus assay is intended as an aid in the diagnosis of influenza from Nasopharyngeal swab specimens and should not be used as a sole basis for treatment. Nasal washings and aspirates are unacceptable for Xpert Xpress SARS-CoV-2/FLU/RSV testing.  Fact Sheet for Patients: EntrepreneurPulse.com.au  Fact Sheet for Healthcare Providers: IncredibleEmployment.be  This test is not yet approved or cleared by the [place] FDA and has been authorized for detection and/or diagnosis of SARS-CoV-2 by FDA under an Emergency Use Authorization (EUA). This EUA will remain in effect (meaning this test can be used) for the duration of the COVID-19 declaration under Section 564(b)(1) of the Act, 21 U.S.C. section 360bbb-3(b)(1), unless the authorization is terminated or revoked.     Resp Syncytial Virus by PCR NEGATIVE NEGATIVE Final    Comment: (NOTE) Fact Sheet for Patients: EntrepreneurPulse.com.au  Fact Sheet for Healthcare Providers: IncredibleEmployment.be  This  test is not yet approved or cleared by the [place] FDA and has been authorized for detection and/or diagnosis of SARS-CoV-2 by FDA under an Emergency Use Authorization (EUA). This EUA will remain in effect (meaning this test can be used) for the duration of the COVID-19 declaration under Section 564(b)(1) of the Act, 21 U.S.C. section 360bbb-3(b)(1), unless the authorization is terminated or revoked.  Performed at [facility], [place] [address] [place], [place] [address]   Respiratory (~20 pathogens) panel by PCR     Status: None  Collection Time: 01/01/23 11:21 AM   Specimen: Nasopharyngeal Swab; Respiratory  Result Value Ref Range Status   Adenovirus NOT DETECTED NOT DETECTED Final   Coronavirus 229E NOT DETECTED NOT DETECTED Final    Comment: (NOTE) The Coronavirus on the Respiratory Panel, DOES NOT test for the novel  Coronavirus (2019 nCoV)    Coronavirus HKU1 NOT DETECTED NOT DETECTED Final   Coronavirus NL63 NOT DETECTED NOT DETECTED Final   Coronavirus OC43 NOT DETECTED NOT DETECTED Final   Metapneumovirus NOT DETECTED NOT DETECTED Final   Rhinovirus / Enterovirus NOT DETECTED NOT DETECTED Final   Influenza A NOT DETECTED NOT DETECTED Final   Influenza B NOT DETECTED NOT DETECTED Final   Parainfluenza Virus 1 NOT DETECTED NOT DETECTED Final   Parainfluenza Virus 2 NOT DETECTED NOT DETECTED Final   Parainfluenza Virus 3 NOT DETECTED NOT DETECTED Final   Parainfluenza Virus 4 NOT DETECTED NOT DETECTED Final   Respiratory Syncytial Virus NOT DETECTED NOT DETECTED Final   Bordetella pertussis NOT DETECTED NOT DETECTED Final   Bordetella Parapertussis NOT DETECTED NOT DETECTED Final   Chlamydophila pneumoniae NOT DETECTED NOT DETECTED Final   Mycoplasma pneumoniae NOT DETECTED NOT DETECTED Final    Comment: Performed at [facility], [place]. [address], [place], [place] [address]  Culture, blood (Routine X 2) w Reflex to ID Panel     Status: None   Collection  Time: 01/01/23  4:43 PM   Specimen: BLOOD  Result Value Ref Range Status   Specimen Description BLOOD RIGHT ANTECUBITAL  Final   Special Requests   Final    BOTTLES DRAWN AEROBIC AND ANAEROBIC Blood Culture adequate volume   Culture   Final    NO GROWTH 5 DAYS Performed at [facility], [place] [address] [place], [place] [address]    Report Status 01/06/2023 FINAL  Final  Culture, blood (Routine X 2) w Reflex to ID Panel     Status: None   Collection Time: 01/01/23  8:55 PM   Specimen: BLOOD  Result Value Ref Range Status   Specimen Description BLOOD LEFT ANTECUBITAL  Final   Special Requests Blood Culture adequate volume  Final   Culture   Final    NO GROWTH 5 DAYS Performed at [facility], [place] [address] [place],  [address]    Report Status 01/06/2023 FINAL  Final     Time coordinating discharge: Over 30 minutes  SIGNED:   Darliss Cheney, MD  [facility] 01/07/2023, 3:54 PM *Please note that this is a verbal dictation therefore any spelling or grammatical errors are due to the "[facility]" system interpretation. If 7PM-7AM, please contact night-coverage [email or web address]

## 2023-01-08 ENCOUNTER — Telehealth: Payer: Self-pay

## 2023-01-08 NOTE — Transitions of Care (Post Inpatient/ED Visit) (Signed)
   01/08/2023  Name: Jordan Kennedy MRN: [id number] DOB: 1956/07/17  Today's TOC FU Call Status: Today's TOC FU Call Status:: Successful TOC FU Call Competed TOC FU Call Complete Date: 01/08/23  Transition Care Management Follow-up Telephone Call Date of Discharge: 01/07/23 Discharge Facility: [facility]) Type of Discharge: Inpatient Admission Primary Inpatient Discharge Diagnosis:: Sepsis How have you been since you were released from the hospital?: Better  Items Reviewed: Did you receive and understand the discharge instructions provided?: Yes (Patient's caregiver wondered if Mr. Bellar would be on the new medications for a long time.  Advised to speak to PCP at [facility]) Medications obtained and verified?: Yes (Medications Reviewed) Any new allergies since your discharge?: No Dietary orders reviewed?: No Do you have support at home?: Yes People in Home: facility resident Name of Support/Comfort Primary Source: Group home- caregiver [facility] and Equipment/Supplies: [place] Ordered?: Yes Name of [place]:: [facility] Has Agency set up a time to come to your home?: No EMR reviewed for [place] Orders: Orders present/patient has not received call (refer to CM for follow-up) Any new equipment or medical supplies ordered?: No  Functional Questionnaire: Do you need assistance with bathing/showering or dressing?: Yes Do you need assistance with meal preparation?: Yes Do you need assistance with eating?: No Do you have difficulty maintaining continence: No Do you need assistance with getting out of bed/getting out of a chair/moving?: Yes Do you have difficulty managing or taking your medications?: Yes  Follow up appointments reviewed: PCP Follow-up appointment confirmed?: Yes Date of PCP follow-up appointment?: 01/16/23 Follow-up Provider: Dr. Gena Fray Specialist [facility] Follow-up appointment confirmed?: NA Do you need transportation to your  follow-up appointment?: No Do you understand care options if your condition(s) worsen?: Yes-patient verbalized understanding  Johnney Killian, RN, BSN, CCM Care Management Coordinator [facility] Phone: [phone]: [phone]

## 2023-01-09 ENCOUNTER — Telehealth: Payer: Self-pay | Admitting: Family Medicine

## 2023-01-09 NOTE — Telephone Encounter (Signed)
Caller Name: pts caretaker Call back phone #: [phone]  Reason for Call: pt was recently in [place]. He was prescribed 5 different meds. One or more of these are causing him to sleep so soundly he is not waking to use the bathroom, both urine and bm. His caretaker would like to know if they can be lowered so he is more alert at night.

## 2023-01-09 NOTE — Telephone Encounter (Signed)
Spoke to Reese, patient's caretaker, he will keep on what they are doing and follow up with [place] on Friday.  Dm/cma

## 2023-01-11 ENCOUNTER — Encounter: Payer: Self-pay | Admitting: Family Medicine

## 2023-01-11 ENCOUNTER — Ambulatory Visit (INDEPENDENT_AMBULATORY_CARE_PROVIDER_SITE_OTHER): Payer: Medicare Other | Admitting: Family Medicine

## 2023-01-11 ENCOUNTER — Telehealth: Payer: Self-pay | Admitting: Family Medicine

## 2023-01-11 VITALS — BP 130/74 | HR 87 | Temp 98.4°F | Ht 74.0 in | Wt 190.8 lb

## 2023-01-11 DIAGNOSIS — F99 Mental disorder, not otherwise specified: Secondary | ICD-10-CM

## 2023-01-11 DIAGNOSIS — F209 Schizophrenia, unspecified: Secondary | ICD-10-CM

## 2023-01-11 DIAGNOSIS — B353 Tinea pedis: Secondary | ICD-10-CM | POA: Insufficient documentation

## 2023-01-11 DIAGNOSIS — I5021 Acute systolic (congestive) heart failure: Secondary | ICD-10-CM | POA: Diagnosis not present

## 2023-01-11 DIAGNOSIS — R6520 Severe sepsis without septic shock: Secondary | ICD-10-CM

## 2023-01-11 DIAGNOSIS — J189 Pneumonia, unspecified organism: Secondary | ICD-10-CM | POA: Diagnosis not present

## 2023-01-11 DIAGNOSIS — F79 Unspecified intellectual disabilities: Secondary | ICD-10-CM

## 2023-01-11 DIAGNOSIS — F17201 Nicotine dependence, unspecified, in remission: Secondary | ICD-10-CM

## 2023-01-11 DIAGNOSIS — K5901 Slow transit constipation: Secondary | ICD-10-CM

## 2023-01-11 DIAGNOSIS — A419 Sepsis, unspecified organism: Secondary | ICD-10-CM

## 2023-01-11 DIAGNOSIS — K219 Gastro-esophageal reflux disease without esophagitis: Secondary | ICD-10-CM

## 2023-01-11 DIAGNOSIS — F5105 Insomnia due to other mental disorder: Secondary | ICD-10-CM | POA: Insufficient documentation

## 2023-01-11 LAB — CBC
HCT: 40.9 % (ref 39.0–52.0)
Hemoglobin: 13.4 g/dL (ref 13.0–17.0)
MCHC: 32.7 g/dL (ref 30.0–36.0)
MCV: 94.3 fl (ref 78.0–100.0)
Platelets: 585 10*3/uL — ABNORMAL HIGH (ref 150.0–400.0)
RBC: 4.33 Mil/uL (ref 4.22–5.81)
RDW: 15.6 % — ABNORMAL HIGH (ref 11.5–15.5)
WBC: 12.9 10*3/uL — ABNORMAL HIGH (ref 4.0–10.5)

## 2023-01-11 LAB — BASIC METABOLIC PANEL
BUN: 17 mg/dL (ref 6–23)
CO2: 28 mEq/L (ref 19–32)
Calcium: 9.2 mg/dL (ref 8.4–10.5)
Chloride: 105 mEq/L (ref 96–112)
Creatinine, Ser: 1.27 mg/dL (ref 0.40–1.50)
GFR: 59.73 mL/min — ABNORMAL LOW (ref >60.00)
Glucose, Bld: 121 mg/dL — ABNORMAL HIGH (ref 70–99)
Potassium: 4.7 mEq/L (ref 3.5–5.1)
Sodium: 142 mEq/L (ref 135–145)

## 2023-01-11 MED ORDER — METOPROLOL TARTRATE 25 MG PO TABS: 12.5000 mg | ORAL_TABLET | Freq: Two times a day (BID) | ORAL | 11 refills | Status: DC

## 2023-01-11 MED ORDER — SPIRONOLACTONE 25 MG PO TABS: 12.5000 mg | ORAL_TABLET | Freq: Every day | ORAL | 11 refills | Status: DC

## 2023-01-11 MED ORDER — OMEPRAZOLE 40 MG PO CPDR: 40.0000 mg | DELAYED_RELEASE_CAPSULE | Freq: Every day | ORAL | 11 refills | Status: DC

## 2023-01-11 MED ORDER — SENNOSIDES-DOCUSATE SODIUM 8.6-50 MG PO TABS: 1.0000 | ORAL_TABLET | Freq: Two times a day (BID) | ORAL | 1 refills | Status: DC

## 2023-01-11 MED ORDER — POLYETHYLENE GLYCOL 3350 17 GM/SCOOP PO POWD: 17.0000 g | ORAL | 11 refills | Status: DC

## 2023-01-11 MED ORDER — DAPAGLIFLOZIN PROPANEDIOL 10 MG PO TABS: 10.0000 mg | ORAL_TABLET | Freq: Every day | ORAL | 11 refills | Status: DC

## 2023-01-11 MED ORDER — SACUBITRIL-VALSARTAN 24-26 MG PO TABS: 1.0000 | ORAL_TABLET | Freq: Two times a day (BID) | ORAL | 11 refills | Status: DC

## 2023-01-11 MED ORDER — CLOTRIMAZOLE 1 % EX CREA: TOPICAL_CREAM | Freq: Two times a day (BID) | CUTANEOUS | 2 refills | Status: DC

## 2023-01-11 MED ORDER — LINACLOTIDE 290 MCG PO CAPS: 290.0000 ug | ORAL_CAPSULE | Freq: Every day | ORAL | 5 refills | Status: DC

## 2023-01-11 MED ORDER — NICOTINE 7 MG/24HR TD PT24: 7.0000 mg | MEDICATED_PATCH | Freq: Every day | TRANSDERMAL | 0 refills | Status: DC

## 2023-01-11 MED ORDER — METOCLOPRAMIDE HCL 5 MG PO TABS: 5.0000 mg | ORAL_TABLET | Freq: Two times a day (BID) | ORAL | 11 refills | Status: DC

## 2023-01-11 MED ORDER — FUROSEMIDE 40 MG PO TABS: 40.0000 mg | ORAL_TABLET | Freq: Every day | ORAL | 11 refills | Status: DC

## 2023-01-11 MED ORDER — ZOLPIDEM TARTRATE 5 MG PO TABS: 5.0000 mg | ORAL_TABLET | Freq: Every day | ORAL | 5 refills | Status: DC

## 2023-01-11 NOTE — Assessment & Plan Note (Signed)
Appears to be resolved. I will repeat a CBC and CMP to monitor his recovery.

## 2023-01-11 NOTE — Assessment & Plan Note (Signed)
I recommend we reduce the Ambien dose to 5 mg nightly to try and reduce episodes of nocturesis or stooling.

## 2023-01-11 NOTE — Assessment & Plan Note (Signed)
Creates some challenges regarding his adherence to medical advice, but overall Jordan Kennedy is doing well.

## 2023-01-11 NOTE — Assessment & Plan Note (Signed)
Stable ?Continue Protonix ?

## 2023-01-11 NOTE — Telephone Encounter (Signed)
Dr Gena Fray suggested adult depends for this pt his caregiver is requesting that Dr Gena Fray write a script for this so it can come from his medicaid. Please call when sent.

## 2023-01-11 NOTE — Assessment & Plan Note (Signed)
Continue clotrimazole as needed for athlete's foot.

## 2023-01-11 NOTE — Assessment & Plan Note (Signed)
Currently has stopped smoking. I re-emphasized the importance of this with Mr. Jordan Kennedy. He is currently using a 14 mg/day patch. He should reduce this to a 7 mg/day patch for 1 month after her completes 28 days on his current patch.

## 2023-01-11 NOTE — Progress Notes (Signed)
[facility] [address] [place] [address] Dept: [phone] Dept Fax: [phone]  Transitional Care Management Visit  Subjective:    Patient ID: Jordan Kennedy, male    DOB: 1956-04-05, 67 y.o..   MRN: [id number]  Chief Complaint  Patient presents with   Annual Exam    CP/labs. Not fasting.  C/o sleeping too soundly that having bathroom accidents in the night.    History of Present Illness:  Patient is in today for hospital follow-up. Jordan Kennedy was admitted to [facility] from 3/12-3/18/2024 with acute respiratory failure with hypoxia and severe sepsis secondary to community-acquired pneumonia, as well as, acute systolic congestive heart failure. He is now back at his group home. He states he is feeling some improvement but is not fully well yet. Jordan Kennedy has quit smoking and is now on a nicotine patch. He is not showing any signs of shortness of breath. He does repeately ask about whether he has to continue to not smoke. He was able to return to work this week. He does have home health coming by to do physical therapy, as he did have some unsteadiness with walking after his hospitalization. Jordan Kennedy notes there has been issues with Jordan Kennedy not following instructions as well as in the past or seeming to be a small bit more confused. He also seems to be sleep much more deeply, tot he point of having an episode of urinating and defecating at night while asleep.  Past Medical History: Patient Active Problem List   Diagnosis Date Noted   Acute systolic (congestive) heart failure ([place]) 01/03/2023   CAP (community acquired pneumonia) 01/02/2023   Severe sepsis ([place]) 01/01/2023   Acute respiratory failure with hypoxia ([place]) 01/01/2023   Prolonged QT interval 01/01/2023   Normocytic anemia 01/01/2023   Bronchitis due to tobacco use 01/01/2023   Transaminitis 01/01/2023   Chronic constipation 01/01/2023   Thrombocytosis 01/01/2023    Acute exacerbation of congestive heart failure ([place]) 01/01/2023   Tardive dyskinesia 11/20/2021   Lower urinary tract symptoms (LUTS) 05/02/2021   History of small bowel obstruction 01/31/2021   History of colon polyps 01/31/2021   Onychomycosis 01/31/2021   Gastroesophageal reflux disease 01/31/2021   Constipation due to slow transit 04/25/2012   Anxiety and depression 04/25/2012   Edentulous 04/25/2012   Schizophrenia ([place])    Intellectual disability    History reviewed. No pertinent surgical history. Family History  Problem Relation Age of Onset   Cancer Mother        unknown type   Outpatient Medications Prior to Visit  Medication Sig Dispense Refill   amantadine (SYMMETREL) 100 MG capsule Take 100 mg by mouth at bedtime.      aspirin EC (GNP ASPIRIN) 81 MG tablet Take 1 tablet (81 mg total) by mouth daily. 90 tablet 3   clotrimazole (LOTRIMIN) 1 % cream Apply topically 2 (two) times daily. 30 g 2   cloZAPine (CLOZARIL) 100 MG tablet Take 2 tablets (200 mg total) by mouth 2 (two) times daily. 120 tablet 0   dapagliflozin propanediol (FARXIGA) 10 MG TABS tablet Take 1 tablet (10 mg total) by mouth daily. 30 tablet 0   furosemide (LASIX) 40 MG tablet Take 1 tablet (40 mg total) by mouth daily. 30 tablet 0   LINZESS 290 MCG CAPS capsule TAKE ONE CAPSULE EACH DAY BEFORE BREAKFAST (Patient taking differently: Take 290 mcg by mouth daily.) 30 capsule 5   Melatonin 10 MG SUBL Place  10 mg under the tongue at bedtime.     metoCLOPramide (REGLAN) 5 MG tablet TAKE ONE TABLET TWICE DAILY (Patient taking differently: Take 5 mg by mouth in the morning and at bedtime.) 60 tablet 11   metoprolol tartrate (LOPRESSOR) 25 MG tablet Take 0.5 tablets (12.5 mg total) by mouth 2 (two) times daily. 30 tablet 0   MYRBETRIQ 50 MG TB24 tablet Take 50 mg by mouth at bedtime.     nicotine (NICODERM CQ - DOSED IN MG/24 HOURS) 14 mg/24hr patch Place 1 patch (14 mg total) onto the skin daily. 28 patch 0    omeprazole (PRILOSEC) 40 MG capsule TAKE ONE CAPSULE EVERY EVENING (Patient taking differently: Take 40 mg by mouth at bedtime.) 90 capsule 1   PARoxetine (PAXIL) 40 MG tablet Take 40 mg by mouth every evening.      polyethylene glycol powder (GLYCOLAX/MIRALAX) 17 GM/SCOOP powder 17G IN 8OZ OF FLUID OF CHOICE MONDAY AND THURSDAY (Patient taking differently: Take 17 g by mouth See admin instructions. 17 g twice weekly on Monday and Thursday.) 510 g 1   sacubitril-valsartan (ENTRESTO) 24-26 MG Take 1 tablet by mouth 2 (two) times daily. 60 tablet 0   SENNA PLUS 8.6-50 MG tablet TAKE ONE TABLET TWICE DAILY (Patient taking differently: Take 1 tablet by mouth 2 (two) times daily.) 180 tablet 1   spironolactone (ALDACTONE) 25 MG tablet Take 0.5 tablets (12.5 mg total) by mouth daily. 15 tablet 0   tamsulosin (FLOMAX) 0.4 MG CAPS capsule Take 0.4 mg by mouth at bedtime.     zolpidem (AMBIEN) 10 MG tablet Take 10 mg by mouth at bedtime.     No facility-administered medications prior to visit.   No Known Allergies   Objective:   Today's Vitals   01/11/23 0911  BP: 130/74  Pulse: 87  Temp: 98.4 F (36.9 C)  TempSrc: Temporal  SpO2: 98%  Weight: 190 lb 12.8 oz (86.5 kg)  Height: 6\' 2"  (1.88 m)   Body mass index is 24.5 kg/m.   General: Well developed, well nourished. No acute distress. HEENT: Normocephalic, non-traumatic. PERRL, EOMI. The pupils are constricted. Conjunctiva   clear. External ears normal. EAC and TMs normal bilaterally. Nose clear without congestion or   rhinorrhea. Mucous membranes moist. Oropharynx clear. Edentulous. Neck: Supple. No lymphadenopathy. No thyromegaly. Lungs: Clear to auscultation bilaterally. No wheezing, rales or rhonchi. CV: RRR without murmurs or rubs. Pulses 2+ bilaterally. Abdomen: Soft, non-tender. Bowel sounds positive, normal pitch and frequency. No   hepatosplenomegaly. No rebound or guarding. Extremities: Full ROM. No joint swelling or tenderness.  No edema noted. Skin: Warm and dry. No rashes. Psych: Alert and oriented. Normal mood and affect.  Health Maintenance Due  Topic Date Due   Zoster Vaccines- Shingrix (1 of 2) Never done     Imaging: Echocardiogram (01/02/2023) IMPRESSIONS   1. Left ventricular ejection fraction, by estimation, is 25 to 30%. The left ventricle has severely decreased function. The left ventricle demonstrates regional wall motion abnormalities (see scoring diagram/findings for description). There is mild asymmetric left ventricular hypertrophy of the infero-lateral segment. Left ventricular diastolic parameters are indeterminate.   2. Right ventricular systolic function is mildly reduced. The right ventricular size is normal. There is normal pulmonary artery systolic pressure. The estimated right ventricular systolic pressure is [id number] mmHg.   3. Left atrial size was moderately dilated.   4. The mitral valve is grossly normal. Moderate to severe mitral valve regurgitation with splay artifact, suggesting this may  be underestimated. No evidence of mitral stenosis.   5. The aortic valve is tricuspid. Aortic valve regurgitation is not visualized. No aortic stenosis is present.   6. The inferior vena cava is normal in size with greater than 50% respiratory variability, suggesting right atrial pressure of 3 mmHg.   Assessment & Plan:   Problem List Items Addressed This Visit       Cardiovascular and Mediastinum   Acute systolic (congestive) heart failure (HCC) - Primary    Appears to be compensated at present. I will continue Mr. Paeth on dapagliflozin 10 mg daily, furosemide 40 mg daily, Entresto 24-26 mg bid, metoprolol tartrate 12.5 mg bid, and spironolactone 12.5 mg daily. The dapagliflozin and diuretics likely are contributing to some increased urination. Due to Mr. Hunger's intellectual challenges, he may have difficulty with getting to the bathroom in time. He may need to consider adult diapers if this persists.  We will continue home health to assist with strengthening. I advised [name] that Mr. Berardino not take NSAIDs. I also recommended he start to be weighed daily. The staff should reach out to cardiology anytime his weight increases more than 3-4 lbs in a week. Mr. Kopplin will follow-up with cardiology on 01/16/2023.      Relevant Medications   dapagliflozin propanediol (FARXIGA) 10 MG TABS tablet   furosemide (LASIX) 40 MG tablet   metoprolol tartrate (LOPRESSOR) 25 MG tablet   sacubitril-valsartan (ENTRESTO) 24-26 MG   spironolactone (ALDACTONE) 25 MG tablet   Other Relevant Orders   Basic metabolic panel (Completed)     Respiratory   CAP (community acquired pneumonia)    Improved. I recommend we plan on a follow-up chest x-ray at his next visit.      Relevant Orders   CBC (Completed)     Digestive   Constipation due to slow transit    Stable. Continue current regimen for maintaining regular bowel movements.      Relevant Medications   linaclotide (LINZESS) 290 MCG CAPS capsule   metoCLOPramide (REGLAN) 5 MG tablet   nicotine (NICODERM CQ - DOSED IN MG/24 HR) 7 mg/24hr patch   senna-docusate (SENNA PLUS) 8.6-50 MG tablet   polyethylene glycol powder (GLYCOLAX/MIRALAX) 17 GM/SCOOP powder   Gastroesophageal reflux disease    Stable. Continue Protonix.      Relevant Medications   linaclotide (LINZESS) 290 MCG CAPS capsule   metoCLOPramide (REGLAN) 5 MG tablet   omeprazole (PRILOSEC) 40 MG capsule   senna-docusate (SENNA PLUS) 8.6-50 MG tablet   polyethylene glycol powder (GLYCOLAX/MIRALAX) 17 GM/SCOOP powder     Musculoskeletal and Integument   Tinea pedis of both feet    Continue clotrimazole as needed for athlete's foot.      Relevant Medications   clotrimazole (LOTRIMIN) 1 % cream     Other   Schizophrenia (HCC) (Chronic)    Stable. It is unclear as to the etiology of current confusion issues. He has constricted pupils as well. I recommend the group home have him  seen by Dr. Darleene Cleaver (psychiatry) soon to reassess his current psych meds.      Intellectual disability (Chronic)    Creates some challenges regarding his adherence to medical advice, but overall Mr. Dorson is doing well.      Tobacco use disorder, mild, in early remission    Currently has stopped smoking. I re-emphasized the importance of this with Mr. Stickle. He is currently using a 14 mg/day patch. He should reduce this to a 7 mg/day patch  for 1 month after her completes 28 days on his current patch.      Severe sepsis ([place])    Appears to be resolved. I will repeat a CBC and CMP to monitor his recovery.      Insomnia due to other mental disorder    I recommend we reduce the Ambien dose to 5 mg nightly to try and reduce episodes of nocturesis or stooling.      Relevant Medications   zolpidem (AMBIEN) 5 MG tablet    Return in about 4 weeks (around 02/08/2023) for Reassessment.   Haydee Salter, MD

## 2023-01-11 NOTE — Assessment & Plan Note (Signed)
Stable. Continue current regimen for maintaining regular bowel movements.

## 2023-01-11 NOTE — Assessment & Plan Note (Signed)
Appears to be compensated at present. I will continue Jordan Kennedy on dapagliflozin 10 mg daily, furosemide 40 mg daily, Entresto 24-26 mg bid, metoprolol tartrate 12.5 mg bid, and spironolactone 12.5 mg daily. The dapagliflozin and diuretics likely are contributing to some increased urination. Due to Jordan Kennedy's intellectual challenges, he may have difficulty with getting to the bathroom in time. He may need to consider adult diapers if this persists. We will continue home health to assist with strengthening. I advised Jordan Kennedy that Jordan Kennedy not take NSAIDs. I also recommended he start to be weighed daily. The staff should reach out to cardiology anytime his weight increases more than 3-4 lbs in a week. Jordan Kennedy will follow-up with cardiology on 01/16/2023.

## 2023-01-11 NOTE — Assessment & Plan Note (Signed)
Improved. I recommend we plan on a follow-up chest x-ray at his next visit.

## 2023-01-11 NOTE — Assessment & Plan Note (Signed)
Stable. It is unclear as to the etiology of current confusion issues. He has constricted pupils as well. I recommend the group home have him seen by Dr. Darleene Cleaver (psychiatry) soon to reassess his current psych meds.

## 2023-01-14 NOTE — Telephone Encounter (Signed)
Patient notified [place] phone. Forms were faxed to [phone].   No further questions. Dm/cma

## 2023-01-15 DIAGNOSIS — A419 Sepsis, unspecified organism: Secondary | ICD-10-CM | POA: Diagnosis not present

## 2023-01-15 DIAGNOSIS — D649 Anemia, unspecified: Secondary | ICD-10-CM

## 2023-01-15 DIAGNOSIS — J9601 Acute respiratory failure with hypoxia: Secondary | ICD-10-CM | POA: Diagnosis not present

## 2023-01-15 DIAGNOSIS — D75839 Thrombocytosis, unspecified: Secondary | ICD-10-CM

## 2023-01-15 DIAGNOSIS — J189 Pneumonia, unspecified organism: Secondary | ICD-10-CM | POA: Diagnosis not present

## 2023-01-15 DIAGNOSIS — Z87891 Personal history of nicotine dependence: Secondary | ICD-10-CM

## 2023-01-15 DIAGNOSIS — F209 Schizophrenia, unspecified: Secondary | ICD-10-CM

## 2023-01-15 DIAGNOSIS — I5021 Acute systolic (congestive) heart failure: Secondary | ICD-10-CM

## 2023-01-15 DIAGNOSIS — F79 Unspecified intellectual disabilities: Secondary | ICD-10-CM

## 2023-01-15 DIAGNOSIS — G2401 Drug induced subacute dyskinesia: Secondary | ICD-10-CM

## 2023-01-15 DIAGNOSIS — K5909 Other constipation: Secondary | ICD-10-CM

## 2023-01-15 DIAGNOSIS — Z7982 Long term (current) use of aspirin: Secondary | ICD-10-CM

## 2023-01-15 DIAGNOSIS — J40 Bronchitis, not specified as acute or chronic: Secondary | ICD-10-CM | POA: Diagnosis not present

## 2023-01-16 ENCOUNTER — Ambulatory Visit: Payer: Medicare Other | Admitting: Family Medicine

## 2023-01-17 ENCOUNTER — Encounter: Payer: Self-pay | Admitting: Cardiology

## 2023-01-17 ENCOUNTER — Ambulatory Visit: Payer: Medicare Other | Attending: Cardiology | Admitting: Cardiology

## 2023-01-17 VITALS — BP 118/52 | HR 86 | Ht 75.0 in | Wt 186.2 lb

## 2023-01-17 DIAGNOSIS — Z79899 Other long term (current) drug therapy: Secondary | ICD-10-CM

## 2023-01-17 DIAGNOSIS — I5022 Chronic systolic (congestive) heart failure: Secondary | ICD-10-CM | POA: Diagnosis present

## 2023-01-17 DIAGNOSIS — R0989 Other specified symptoms and signs involving the circulatory and respiratory systems: Secondary | ICD-10-CM | POA: Diagnosis present

## 2023-01-17 NOTE — Patient Instructions (Addendum)
Medication Instructions:  Your physician recommends that you continue on your current medications as directed. Please refer to the Current Medication list given to you today.   *If you need a refill on your cardiac medications before your next appointment, please call your pharmacy*   Lab Work: Please complete Blood Work today.   BMET Mg2+   Testing/Procedures: Your physician has requested that you have an echocardiogram. Echocardiography is a painless test that uses sound waves to create images of your heart. It provides your doctor with information about the size and shape of your heart and how well your heart's chambers and valves are working. This procedure takes approximately one hour. There are no restrictions for this procedure.  Please do NOT wear cologne, perfume, aftershave, or lotions (deodorant is allowed). Please arrive 15 minutes prior to your appointment time.    Follow-Up: At [facility], you and your health needs are our priority.  As part of our continuing mission to provide you with exceptional heart care, we have created designated Provider Care Teams.  These Care Teams include your primary Cardiologist (physician) and Advanced Practice Providers (APPs -  Physician Assistants and Nurse Practitioners) who all work together to provide you with the care you need, when you need it.  We recommend signing up for the patient portal called "MyChart".  Sign up information is provided on this After Visit Summary.  MyChart is used to connect with patients for Virtual Visits (Telemedicine).  Patients are able to view lab/test results, encounter notes, upcoming appointments, etc.  Non-urgent messages can be sent to your provider as well.   To learn more about what you can do with MyChart, go to [email or web address].    Your next appointment:   4 month(s)  Provider:   Berniece Salines, DO

## 2023-01-17 NOTE — Progress Notes (Signed)
Cardiology Office Note:    Date:  01/22/2023   ID:  Jordan Kennedy, DOB 07-Nov-1955, MRN [id number]  PCP:  Haydee Salter, MD  Cardiologist:  Berniece Salines, DO  Electrophysiologist:  None   Referring MD: Haydee Salter, MD   "I am okay"   History of Present Illness:    Jordan Kennedy is a 67 y.o. male with a hx of cognitive impairment was retrofitted, tardive dyskinesia, recently diagnosed depressed ejection fraction EF 25 to 30% with the plan for medical management due to his inability to be able to follow direction for any ischemic evaluation. he is here today for follow-up. He does not have any complaints.  He is with his caregiver.   Past Medical History:  Diagnosis Date   AKI (acute kidney injury) ([place]) 06/29/2018   Anemia    Anemia, iron deficiency 09/18/2015   Bowel obstruction (HCC)    BPH (benign prostatic hyperplasia)    Chronic constipation    History of ileus    Ileus ([place]) 03/07/2017   Mental retardation    Partial bowel obstruction ([place])    Schizophrenia ([place])    Monarche every three months;    SIRS (systemic inflammatory response syndrome) ([place]) 06/29/2018   Small bowel obstruction ([place]) 06/29/2018    No past surgical history on file.  Current Medications: Current Meds  Medication Sig   amantadine (SYMMETREL) 100 MG capsule Take 100 mg by mouth at bedtime.    aspirin EC (GNP ASPIRIN) 81 MG tablet Take 1 tablet (81 mg total) by mouth daily.   clotrimazole (LOTRIMIN) 1 % cream Apply topically 2 (two) times daily.   cloZAPine (CLOZARIL) 100 MG tablet Take 2 tablets (200 mg total) by mouth 2 (two) times daily.   dapagliflozin propanediol (FARXIGA) 10 MG TABS tablet Take 1 tablet (10 mg total) by mouth daily.   linaclotide (LINZESS) 290 MCG CAPS capsule Take 1 capsule (290 mcg total) by mouth daily before breakfast.   Melatonin 10 MG SUBL Place 10 mg under the tongue at bedtime.   metoCLOPramide (REGLAN) 5 MG tablet Take 1 tablet (5 mg total) by mouth 2 (two) times  daily.   metoprolol tartrate (LOPRESSOR) 25 MG tablet Take 0.5 tablets (12.5 mg total) by mouth 2 (two) times daily.   MYRBETRIQ 50 MG TB24 tablet Take 50 mg by mouth at bedtime.   nicotine (NICODERM CQ - DOSED IN MG/24 HR) 7 mg/24hr patch Place 1 patch (7 mg total) onto the skin daily. Start after completing 4 weeks on the 14 mg patch   omeprazole (PRILOSEC) 40 MG capsule Take 1 capsule (40 mg total) by mouth at bedtime.   PARoxetine (PAXIL) 40 MG tablet Take 40 mg by mouth every evening.    polyethylene glycol powder (GLYCOLAX/MIRALAX) 17 GM/SCOOP powder Take 17 g by mouth See admin instructions. 17 g twice weekly on Monday and Thursday.   sacubitril-valsartan (ENTRESTO) 24-26 MG Take 1 tablet by mouth 2 (two) times daily.   senna-docusate (SENNA PLUS) 8.6-50 MG tablet Take 1 tablet by mouth 2 (two) times daily.   spironolactone (ALDACTONE) 25 MG tablet Take 0.5 tablets (12.5 mg total) by mouth daily.   tamsulosin (FLOMAX) 0.4 MG CAPS capsule Take 0.4 mg by mouth at bedtime.   zolpidem (AMBIEN) 5 MG tablet Take 1 tablet (5 mg total) by mouth at bedtime.   [DISCONTINUED] furosemide (LASIX) 40 MG tablet Take 1 tablet (40 mg total) by mouth daily.     Allergies:   Patient has  no known allergies.   Social History   Socioeconomic History   Marital status: Single    Spouse name: Not on file   Number of children: Not on file   Years of education: Not on file   Highest education level: Not on file  Occupational History   Not on file  Tobacco Use   Smoking status: Every Day    Packs/day: 0.25    Years: 30.00    Additional pack years: 0.00    Total pack years: 7.50    Types: Cigarettes   Smokeless tobacco: Never  Vaping Use   Vaping Use: Never used  Substance and Sexual Activity   Alcohol use: No   Drug use: No   Sexual activity: Never  Other Topics Concern   Not on file  Social History Narrative   Marital status: single      Children: none       Employment: disability due to  mental retardation and schizophrenia.      Lives: group home [place]; [facility]; 3 men in group home.        Tobacco: six cigarettes per day; since age 52.      Alcohol:  None      Drugs: none      Exercise:  Walking some.      Seatbelt:  100%   Social Determinants of Health   Financial Resource Strain: Low Risk  (10/16/2022)   Overall Financial Resource Strain (CARDIA)    Difficulty of Paying Living Expenses: Not hard at all  Food Insecurity: No Food Insecurity (10/16/2022)   Hunger Vital Sign    Worried About Running Out of Food in the Last Year: Never true    Ran Out of Food in the Last Year: Never true  Transportation Needs: No Transportation Needs (10/16/2022)   PRAPARE - Hydrologist (Medical): No    Lack of Transportation (Non-Medical): No  Physical Activity: Inactive (10/16/2022)   Exercise Vital Sign    Days of Exercise per Week: 0 days    Minutes of Exercise per Session: 0 min  Stress: No Stress Concern Present (10/16/2022)   [place]    Feeling of Stress : Not at all  Social Connections: Socially Isolated (10/11/2021)   Social Connection and Isolation Panel [NHANES]    Frequency of Communication with Friends and Family: Twice a week    Frequency of Social Gatherings with Friends and Family: Twice a week    Attends Religious Services: Never    Marine scientist or Organizations: No    Attends Music therapist: Never    Marital Status: Never married     Family History: The patient's family history includes Cancer in his mother.  ROS:   Review of Systems  Constitution: Negative for decreased appetite, fever and weight gain.  HENT: Negative for congestion, ear discharge, hoarse voice and sore throat.   Eyes: Negative for discharge, redness, vision loss in right eye and visual halos.  Cardiovascular: Negative for chest pain,  dyspnea on exertion, leg swelling, orthopnea and palpitations.  Respiratory: Negative for cough, hemoptysis, shortness of breath and snoring.   Endocrine: Negative for heat intolerance and polyphagia.  Hematologic/Lymphatic: Negative for bleeding problem. Does not bruise/bleed easily.  Skin: Negative for flushing, nail changes, rash and suspicious lesions.  Musculoskeletal: Negative for arthritis, joint pain, muscle cramps, myalgias, neck pain and stiffness.  Gastrointestinal:  Negative for abdominal pain, bowel incontinence, diarrhea and excessive appetite.  Genitourinary: Negative for decreased libido, genital sores and incomplete emptying.  Neurological: Negative for brief paralysis, focal weakness, headaches and loss of balance.  Psychiatric/Behavioral: Negative for altered mental status, depression and suicidal ideas.  Allergic/Immunologic: Negative for HIV exposure and persistent infections.    EKGs/Labs/Other Studies Reviewed:    The following studies were reviewed today:   EKG:  None today   TTE 01/02/2023 IMPRESSIONS     1. Left ventricular ejection fraction, by estimation, is 25 to 30%. The  left ventricle has severely decreased function. The left ventricle  demonstrates regional wall motion abnormalities (see scoring  diagram/findings for description). There is mild  asymmetric left ventricular hypertrophy of the infero-lateral segment.  Left ventricular diastolic parameters are indeterminate.   2. Right ventricular systolic function is mildly reduced. The right  ventricular size is normal. There is normal pulmonary artery systolic  pressure. The estimated right ventricular systolic pressure is Q000111Q mmHg.   3. Left atrial size was moderately dilated.   4. The mitral valve is grossly normal. Moderate to severe mitral valve  regurgitation with splay artifact, suggesting this may be underestimated.  No evidence of mitral stenosis.   5. The aortic valve is tricuspid. Aortic  valve regurgitation is not  visualized. No aortic stenosis is present.   6. The inferior vena cava is normal in size with greater than 50%  respiratory variability, suggesting right atrial pressure of 3 mmHg.   FINDINGS   Left Ventricle: Left ventricular ejection fraction, by estimation, is 25  to 30%. The left ventricle has severely decreased function. The left  ventricle demonstrates regional wall motion abnormalities. Definity  contrast agent was given IV to delineate  the left ventricular endocardial borders. The left ventricular internal  cavity size was normal in size. There is mild asymmetric left ventricular  hypertrophy of the infero-lateral segment. Left ventricular diastolic  parameters are indeterminate.     LV Wall Scoring:  The anterior septum, mid inferoseptal segment, and basal inferoseptal  segment  are akinetic. The apical septal segment is hypokinetic.   Right Ventricle: The right ventricular size is normal. No increase in  right ventricular wall thickness. Right ventricular systolic function is  mildly reduced. There is normal pulmonary artery systolic pressure. The  tricuspid regurgitant velocity is 2.12  m/s, and with an assumed right atrial pressure of 3 mmHg, the estimated  right ventricular systolic pressure is Q000111Q mmHg.   Left Atrium: Left atrial size was moderately dilated.   Right Atrium: Right atrial size was normal in size.   Pericardium: There is no evidence of pericardial effusion.   Mitral Valve: The mitral valve is grossly normal. Moderate to severe  mitral valve regurgitation. No evidence of mitral valve stenosis. MV peak  gradient, 8.2 mmHg. The mean mitral valve gradient is 2.0 mmHg.   Tricuspid Valve: The tricuspid valve is normal in structure. Tricuspid  valve regurgitation is trivial. No evidence of tricuspid stenosis.   Aortic Valve: The aortic valve is tricuspid. Aortic valve regurgitation is  not visualized. No aortic stenosis is  present. Aortic valve mean gradient  measures 4.0 mmHg. Aortic valve peak gradient measures 7.7 mmHg. Aortic  valve area, by VTI measures 2.22  cm.   Pulmonic Valve: The pulmonic valve was normal in structure. Pulmonic valve  regurgitation is trivial. No evidence of pulmonic stenosis.   Aorta: The aortic root is normal in size and structure.   Venous: The  inferior vena cava is normal in size with greater than 50%  respiratory variability, suggesting right atrial pressure of 3 mmHg.   IAS/Shunts: No atrial level shunt detected by color flow Doppler.   Recent Labs: 01/01/2023: B Natriuretic Peptide 985.0 01/04/2023: TSH 0.850 01/05/2023: ALT 200 01/11/2023: Hemoglobin 13.4; Platelets 585.0 01/17/2023: BUN 15; Creatinine, Ser 1.40; Magnesium 2.6; Potassium 4.4; Sodium 142  Recent Lipid Panel    Component Value Date/Time   CHOL 144 01/31/2021 1000   TRIG 143.0 01/31/2021 1000   HDL 41.40 01/31/2021 1000   CHOLHDL 3 01/31/2021 1000   VLDL 28.6 01/31/2021 1000   LDLCALC 74 01/31/2021 1000    Physical Exam:    VS:  BP (!) 118/52 (BP Location: Left Arm, Patient Position: Sitting, Cuff Size: Normal)   Pulse 86   Ht 6\' 3"  (1.905 m)   Wt 186 lb 3.2 oz (84.5 kg)   SpO2 96%   BMI 23.27 kg/m     Wt Readings from Last 3 Encounters:  01/17/23 186 lb 3.2 oz (84.5 kg)  01/11/23 190 lb 12.8 oz (86.5 kg)  01/07/23 197 lb 8.5 oz (89.6 kg)     GEN: Well nourished, well developed in no acute distress HEENT: Normal NECK: No JVD; No carotid bruits LYMPHATICS: No lymphadenopathy CARDIAC: S1S2 noted,RRR, no murmurs, rubs, gallops RESPIRATORY:  Clear to auscultation without rales, wheezing or rhonchi  ABDOMEN: Soft, non-tender, non-distended, +bowel sounds, no guarding. EXTREMITIES: No edema, No cyanosis, no clubbing MUSCULOSKELETAL:  No deformity  SKIN: Warm and dry NEUROLOGIC:  Alert and oriented x 3, non-focal PSYCHIATRIC:  Normal affect, good insight  ASSESSMENT:    1. Depressed  left ventricular ejection fraction   2. Medication management   3. Chronic systolic (congestive) heart failure (HCC)    PLAN:    He is clinically stable on the guideline directed medical therapy no changes will be made here today.  Will continue metoprolol 12.5 mg daily, Entresto 24-26 mg twice daily, Aldactone 12.5 mg daily and Farxiga.  Will plan to repeat echocardiogram be 3 months after he had his first echo .  Work will be done for [employer] and mag.  The patient is in agreement with the above plan. The patient left the office in stable condition.  The patient will follow up in 4 months or sooner if needed.   Medication Adjustments/Labs and Tests Ordered: Current medicines are reviewed at length with the patient today.  Concerns regarding medicines are outlined above.  Orders Placed This Encounter  Procedures   Basic Metabolic Panel (BMET)   Magnesium   ECHOCARDIOGRAM COMPLETE   No orders of the defined types were placed in this encounter.   Patient Instructions  Medication Instructions:  Your physician recommends that you continue on your current medications as directed. Please refer to the Current Medication list given to you today.   *If you need a refill on your cardiac medications before your next appointment, please call your pharmacy*   Lab Work: Please complete Blood Work today.   BMET Mg2+   Testing/Procedures: Your physician has requested that you have an echocardiogram. Echocardiography is a painless test that uses sound waves to create images of your heart. It provides your doctor with information about the size and shape of your heart and how well your heart's chambers and valves are working. This procedure takes approximately one hour. There are no restrictions for this procedure.  Please do NOT wear cologne, perfume, aftershave, or lotions (deodorant is allowed). Please arrive  15 minutes prior to your appointment time.    Follow-Up: At [facility], you and your health needs are our priority.  As part of our continuing mission to provide you with exceptional heart care, we have created designated Provider Care Teams.  These Care Teams include your primary Cardiologist (physician) and Advanced Practice Providers (APPs -  Physician Assistants and Nurse Practitioners) who all work together to provide you with the care you need, when you need it.  We recommend signing up for the patient portal called "MyChart".  Sign up information is provided on this After Visit Summary.  MyChart is used to connect with patients for Virtual Visits (Telemedicine).  Patients are able to view lab/test results, encounter notes, upcoming appointments, etc.  Non-urgent messages can be sent to your provider as well.   To learn more about what you can do with MyChart, go to [email or web address].    Your next appointment:   4 month(s)  Provider:   Berniece Salines, DO        Adopting a Healthy Lifestyle.  Know what a healthy weight is for you (roughly BMI <25) and aim to maintain this   Aim for 7+ servings of fruits and vegetables daily   65-80+ fluid ounces of water or unsweet tea for healthy kidneys   Limit to max 1 drink of alcohol per day; avoid smoking/tobacco   Limit animal fats in diet for cholesterol and heart health - choose grass fed whenever available   Avoid highly processed foods, and foods high in saturated/trans fats   Aim for low stress - take time to unwind and care for your mental health   Aim for 150 min of moderate intensity exercise weekly for heart health, and weights twice weekly for bone health   Aim for 7-9 hours of sleep daily   When it comes to diets, agreement about the perfect plan isnt easy to find, even among the experts. Experts at the [employer] developed an idea known as the Healthy Eating Plate. Just imagine a plate divided into logical, healthy portions.   The emphasis is on diet  quality:   Load up on vegetables and fruits - one-half of your plate: Aim for color and variety, and remember that potatoes dont count.   Go for whole grains - one-quarter of your plate: Whole wheat, barley, wheat berries, quinoa, oats, brown rice, and foods made with them. If you want pasta, go with whole wheat pasta.   Protein power - one-quarter of your plate: Fish, chicken, beans, and nuts are all healthy, versatile protein sources. Limit red meat.   The diet, however, does go beyond the plate, offering a few other suggestions.   Use healthy plant oils, such as olive, canola, soy, corn, sunflower and peanut. Check the labels, and avoid partially hydrogenated oil, which have unhealthy trans fats.   If youre thirsty, drink water. Coffee and tea are good in moderation, but skip sugary drinks and limit milk and dairy products to one or two daily servings.   The type of carbohydrate in the diet is more important than the amount. Some sources of carbohydrates, such as vegetables, fruits, whole grains, and beans-are healthier than others.   Finally, stay active  Signed, Berniece Salines, DO  01/22/2023 12:41 PM    [facility]

## 2023-01-18 LAB — BASIC METABOLIC PANEL WITH GFR
BUN/Creatinine Ratio: 11 (ref 10–24)
BUN: 15 mg/dL (ref 8–27)
CO2: 18 mmol/L — ABNORMAL LOW (ref 20–29)
Calcium: 9.2 mg/dL (ref 8.6–10.2)
Chloride: 108 mmol/L — ABNORMAL HIGH (ref 96–106)
Creatinine, Ser: 1.4 mg/dL — ABNORMAL HIGH (ref 0.76–1.27)
Glucose: 121 mg/dL — ABNORMAL HIGH (ref 70–99)
Potassium: 4.4 mmol/L (ref 3.5–5.2)
Sodium: 142 mmol/L (ref 134–144)
eGFR: 56 mL/min/1.73 — ABNORMAL LOW

## 2023-01-18 LAB — MAGNESIUM: Magnesium: 2.6 mg/dL — ABNORMAL HIGH (ref 1.6–2.3)

## 2023-01-21 ENCOUNTER — Other Ambulatory Visit: Payer: Self-pay

## 2023-01-21 DIAGNOSIS — I5021 Acute systolic (congestive) heart failure: Secondary | ICD-10-CM

## 2023-01-21 MED ORDER — FUROSEMIDE 40 MG PO TABS: 40.0000 mg | ORAL_TABLET | ORAL | 11 refills | Status: DC

## 2023-01-21 NOTE — Progress Notes (Signed)
Medication list updated.

## 2023-01-23 ENCOUNTER — Telehealth: Payer: Self-pay | Admitting: Family Medicine

## 2023-01-23 NOTE — Telephone Encounter (Signed)
Danae Chen the occupational therapist would like you to give her a call @ [phone]

## 2023-01-23 NOTE — Telephone Encounter (Signed)
Spoke to [place], she will call her office to have them fix the wording on the order and get it resent to [place].   Dm/cma

## 2023-02-11 ENCOUNTER — Ambulatory Visit: Payer: Medicare Other | Admitting: Family Medicine

## 2023-02-13 ENCOUNTER — Ambulatory Visit (INDEPENDENT_AMBULATORY_CARE_PROVIDER_SITE_OTHER): Payer: Medicare Other | Admitting: Family Medicine

## 2023-02-13 ENCOUNTER — Ambulatory Visit: Payer: Medicare Other | Attending: Internal Medicine | Admitting: Internal Medicine

## 2023-02-13 ENCOUNTER — Telehealth: Payer: Self-pay | Admitting: Family Medicine

## 2023-02-13 ENCOUNTER — Telehealth: Payer: Self-pay | Admitting: Cardiology

## 2023-02-13 ENCOUNTER — Encounter: Payer: Self-pay | Admitting: Family Medicine

## 2023-02-13 VITALS — BP 116/70 | HR 68 | Temp 97.7°F | Ht 75.0 in | Wt 191.2 lb

## 2023-02-13 VITALS — BP 90/66 | HR 46 | Ht 74.0 in | Wt 193.6 lb

## 2023-02-13 DIAGNOSIS — I447 Left bundle-branch block, unspecified: Secondary | ICD-10-CM | POA: Diagnosis present

## 2023-02-13 DIAGNOSIS — F17201 Nicotine dependence, unspecified, in remission: Secondary | ICD-10-CM

## 2023-02-13 DIAGNOSIS — I493 Ventricular premature depolarization: Secondary | ICD-10-CM

## 2023-02-13 DIAGNOSIS — I5022 Chronic systolic (congestive) heart failure: Secondary | ICD-10-CM | POA: Diagnosis not present

## 2023-02-13 LAB — BASIC METABOLIC PANEL
BUN: 13 mg/dL (ref 6–23)
CO2: 27 mEq/L (ref 19–32)
Calcium: 9.4 mg/dL (ref 8.4–10.5)
Chloride: 108 mEq/L (ref 96–112)
Creatinine, Ser: 1.36 mg/dL (ref 0.40–1.50)
GFR: 54.98 mL/min — ABNORMAL LOW (ref >60.00)
Glucose, Bld: 77 mg/dL (ref 70–99)
Potassium: 3.9 mEq/L (ref 3.5–5.1)
Sodium: 144 mEq/L (ref 135–145)

## 2023-02-13 LAB — BRAIN NATRIURETIC PEPTIDE: Pro B Natriuretic peptide (BNP): 933 pg/mL — ABNORMAL HIGH (ref 0.0–100.0)

## 2023-02-13 MED ORDER — FUROSEMIDE 40 MG PO TABS: 40.0000 mg | ORAL_TABLET | ORAL | 3 refills | Status: DC | PRN

## 2023-02-13 NOTE — Patient Instructions (Signed)
Medication Instructions:  Your physician has recommended you make the following change in your medication: DECREASE: Lasix 40 mg as needed *If you need a refill on your cardiac medications before your next appointment, please call your pharmacy*   Lab Work: None    Testing/Procedures: None   Follow-Up: At [facility], you and your health needs are our priority.  As part of our continuing mission to provide you with exceptional heart care, we have created designated Provider Care Teams.  These Care Teams include your primary Cardiologist (physician) and Advanced Practice Providers (APPs -  Physician Assistants and Nurse Practitioners) who all work together to provide you with the care you need, when you need it.  We recommend signing up for the patient portal called "MyChart".  Sign up information is provided on this After Visit Summary.  MyChart is used to connect with patients for Virtual Visits (Telemedicine).  Patients are able to view lab/test results, encounter notes, upcoming appointments, etc.  Non-urgent messages can be sent to your provider as well.   To learn more about what you can do with MyChart, go to [email or web address].    Your next appointment:   [id number]  Provider:   Thomasene Ripple, DO

## 2023-02-13 NOTE — Telephone Encounter (Signed)
Denise from Dr. Veto Kemps office called stating pt had an abnormal EKG. I originally scheduled with first available APP, however she called back stating PCP prefer he see Dr. Servando Salina because he may need heart cath. Dr. Servando Salina is not in office this week or next, pt may have to DOD. Please advise.

## 2023-02-13 NOTE — Assessment & Plan Note (Addendum)
Weight is stable compared to his last visit with me, but up 5 lbs form his last visit with Dr. Servando Salina. This may reflect reducing his Lasix dose to every other day. I do not see any lower leg edema, but he may be mildly tachypneic. I will check a BNP to assess for decompensation of his HFrEF. Continue dapagliflozin 10 mg daily, furosemide 40 mg every other day, Entresto 24-26 mg bid, metoprolol tartrate 12.5 mg bid, and spironolactone 12.5 mg daily

## 2023-02-13 NOTE — Assessment & Plan Note (Signed)
Prior EKGs had shown frequent PVCs when he was hospitalized, but this had not been present at discharge. This could be a concern for possible progressive cardiomyopathy. I will reassess his electrolytes.

## 2023-02-13 NOTE — Telephone Encounter (Signed)
Jordan Kennedy [phone] called and said that dr tobb will not be in office so do you want the pt to see dr Veto Kemps

## 2023-02-13 NOTE — Telephone Encounter (Signed)
Spoke to [employer].  Appointment will stay with PA for now due to Dr Servando Salina is working the hospital and won't be in office till May 6th.  They will consult with Dr Servando Salina. Dm/cma

## 2023-02-13 NOTE — Progress Notes (Signed)
[facility] [address] [place] [address] Dept: [phone] Dept Fax: [phone]  Chronic Care Office Visit  Subjective:    Patient ID: Jordan Kennedy, male    DOB: March 23, 1956, 67 y.o..   MRN: [id number]  Chief Complaint  Patient presents with   Medical Management of Chronic Issues    1 month f/u.  No concerns.      History of Present Illness:  Patient is in today for reassessment of chronic medical issues.  Jordan Kennedy has a history of acute HFrEF diagnosed during an admission for CAP and severe sepsis. He was discharged on dapagliflozin 10 mg daily, furosemide 40 mg daily, Entresto 24-26 mg bid, metoprolol tartrate 12.5 mg bid, and spironolactone 12.5 mg daily. Since his last visit, he was seen by Dr. Servando Salina. She had checked labs and found his renal function to be mildly declined. She had him reduce his Lasix to every other day. He notes that he is doing well, which Jordan Kennedy confirms. He is not having as much issue with bed wetting as he was before. He continues to not smoke though he asks me repeatedly today about whether he can go back to smoking.   Past Medical History: Patient Active Problem List   Diagnosis Date Noted   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   Tinea pedis of both feet 01/11/2023   Chronic systolic heart failure 01/03/2023   CAP (community acquired pneumonia) 01/02/2023   Severe sepsis 01/01/2023   Acute respiratory failure with hypoxia 01/01/2023   Prolonged QT interval 01/01/2023   Normocytic anemia 01/01/2023   Bronchitis due to tobacco use 01/01/2023   Transaminitis 01/01/2023   Chronic constipation 01/01/2023   Thrombocytosis 01/01/2023   Acute exacerbation of congestive heart failure 01/01/2023   Tardive dyskinesia 11/20/2021   Lower urinary tract symptoms (LUTS) 05/02/2021   History of small bowel obstruction 01/31/2021   History of colon polyps 01/31/2021   Onychomycosis  01/31/2021   Gastroesophageal reflux disease 01/31/2021   Tobacco use disorder, mild, in early remission 02/01/2015   Constipation due to slow transit 04/25/2012   Anxiety and depression 04/25/2012   Edentulous 04/25/2012   Schizophrenia    Intellectual disability    History reviewed. No pertinent surgical history. Family History  Problem Relation Age of Onset   Cancer Mother        unknown type   Outpatient Medications Prior to Visit  Medication Sig Dispense Refill   amantadine (SYMMETREL) 100 MG capsule Take 100 mg by mouth at bedtime.      aspirin EC (GNP ASPIRIN) 81 MG tablet Take 1 tablet (81 mg total) by mouth daily. 90 tablet 3   clotrimazole (LOTRIMIN) 1 % cream Apply topically 2 (two) times daily. 30 g 2   cloZAPine (CLOZARIL) 100 MG tablet Take 2 tablets (200 mg total) by mouth 2 (two) times daily. 120 tablet 0   dapagliflozin propanediol (FARXIGA) 10 MG TABS tablet Take 1 tablet (10 mg total) by mouth daily. 30 tablet 11   furosemide (LASIX) 40 MG tablet Take 1 tablet (40 mg total) by mouth every other day. 15 tablet 11   linaclotide (LINZESS) 290 MCG CAPS capsule Take 1 capsule (290 mcg total) by mouth daily before breakfast. 30 capsule 5   Melatonin 10 MG SUBL Place 10 mg under the tongue at bedtime.     metoCLOPramide (REGLAN) 5 MG tablet Take 1 tablet (5 mg total) by mouth 2 (two) times daily.  60 tablet 11   metoprolol tartrate (LOPRESSOR) 25 MG tablet Take 0.5 tablets (12.5 mg total) by mouth 2 (two) times daily. 30 tablet 11   MYRBETRIQ 50 MG TB24 tablet Take 50 mg by mouth at bedtime.     nicotine (NICODERM CQ - DOSED IN MG/24 HR) 7 mg/24hr patch Place 1 patch (7 mg total) onto the skin daily. Start after completing 4 weeks on the 14 mg patch 28 patch 0   omeprazole (PRILOSEC) 40 MG capsule Take 1 capsule (40 mg total) by mouth at bedtime. 30 capsule 11   PARoxetine (PAXIL) 40 MG tablet Take 40 mg by mouth every evening.      polyethylene glycol powder  (GLYCOLAX/MIRALAX) 17 GM/SCOOP powder Take 17 g by mouth See admin instructions. 17 g twice weekly on Monday and Thursday. 510 g 11   sacubitril-valsartan (ENTRESTO) 24-26 MG Take 1 tablet by mouth 2 (two) times daily. 60 tablet 11   senna-docusate (SENNA PLUS) 8.6-50 MG tablet Take 1 tablet by mouth 2 (two) times daily. 180 tablet 1   spironolactone (ALDACTONE) 25 MG tablet Take 0.5 tablets (12.5 mg total) by mouth daily. 15 tablet 11   tamsulosin (FLOMAX) 0.4 MG CAPS capsule Take 0.4 mg by mouth at bedtime.     zolpidem (AMBIEN) 5 MG tablet Take 1 tablet (5 mg total) by mouth at bedtime. 30 tablet 5   No facility-administered medications prior to visit.   No Known Allergies Objective:   Today's Vitals   02/13/23 0925  BP: 116/70  Pulse: 68  Temp: 97.7 F (36.5 C)  TempSrc: Temporal  SpO2: 100%  Weight: 191 lb 3.2 oz (86.7 kg)  Height: 6\' 3"  (1.905 m)   Body mass index is 23.9 kg/m.   General: Well developed, well nourished. Mild tachypnea. Lungs: Clear to auscultation bilaterally. No wheezing, rales or rhonchi. CV: Irregular rhythm with frequent skipped beats without murmurs or rubs. Pulses 2+ bilaterally. Extremities: No edema noted. Psych: Alert and oriented. Normal mood and affect.  There are no preventive care reminders to display for this patient.  Lab Results    Latest Ref Rng & Units 01/17/2023   10:04 AM 01/11/2023   10:05 AM 01/07/2023    8:13 AM  BMP  Glucose 70 - 99 mg/dL 130  865  784   BUN 8 - 27 mg/dL 15  17  22    Creatinine 0.76 - 1.27 mg/dL 6.96  2.95  2.84   BUN/Creat Ratio 10 - 24 11     Sodium 134 - 144 mmol/L 142  142  136   Potassium 3.5 - 5.2 mmol/L 4.4  4.7  4.1   Chloride 96 - 106 mmol/L 108  105  102   CO2 20 - 29 mmol/L 18  28  27    Calcium 8.6 - 10.2 mg/dL 9.2  9.2  8.3    EKG: Compared to EKGs from 3/12-3/17/2024. Sinus rhythm with frequent PVCs.  There is a new LBBB.  Assessment & Plan:   Problem List Items Addressed This Visit        Cardiovascular and Mediastinum   Chronic systolic heart failure    Weight is stable compared to his last visit with me, but up 5 lbs form his last visit with Dr. Servando Salina. This may reflect reducing his Lasix dose to every other day. I do not see any lower leg edema, but he may be mildly tachypneic. I will check a BNP to assess for decompensation of his HFrEF.  Continue dapagliflozin 10 mg daily, furosemide 40 mg every other day, Entresto 24-26 mg bid, metoprolol tartrate 12.5 mg bid, and spironolactone 12.5 mg daily      Relevant Orders   Brain natriuretic peptide   Troponin I   Basic metabolic panel   Left bundle branch block    Left BBB is new compared to EKGs form last month. I am concerned that this might represent sequelae of cardiac ischemia. I will check a troponin. I have asked that he be seen more urgently in follow-up with cardiology. He may need a cardiac cath to assess for CAD.      Relevant Orders   Brain natriuretic peptide   Troponin I   Frequent PVCs - Primary    Prior EKGs had shown frequent PVCs when he was hospitalized, but this had not been present at discharge. This could be a concern for possible progressive cardiomyopathy. I will reassess his electrolytes.      Relevant Orders   EKG 12-Lead (Completed)     Other   Tobacco use disorder, mild, in early remission    I continue to strongly recommend against Mr. Delatte returning to smoking.        Return in about 4 weeks (around 03/13/2023) for Reassessment.   Loyola Mast, MD

## 2023-02-13 NOTE — Assessment & Plan Note (Signed)
Left BBB is new compared to EKGs form last month. I am concerned that this might represent sequelae of cardiac ischemia. I will check a troponin. I have asked that he be seen more urgently in follow-up with cardiology. He may need a cardiac cath to assess for CAD.

## 2023-02-13 NOTE — Telephone Encounter (Signed)
Spoke with doctor office and the PCP is concerned for blockage due to EKG results.  Nurse states that he wants patient to see his Cardiologist. Explained that Dr Servando Salina is not I office until May 6. States EKG shows PVC's and changed from last EKG and the concern.  Advised I would look for appt. Soonest available.  Call to patient and spoke with caretaker, Burt Knack.  He will bring patient to office today for Acute appt.  He does not have copy of EKG.  Advised to come to office 4:10 to be checked in and possible repeat EKG and will see DOD.

## 2023-02-13 NOTE — Progress Notes (Signed)
Cardiology Office Note:    Date:  02/13/2023   ID:  Jordan Kennedy, DOB 1956-04-14, MRN [id number]  PCP:  Loyola Mast, MD   [facility] Providers Cardiologist:  Thomasene Ripple, DO     Referring MD: Loyola Mast, MD   No chief complaint on file. PVCs/LBBB  History of Present Illness:    Jordan Kennedy is a 67 y.o. male with a hx of  hx of cognitive impairment was retrofitted, tardive dyskinesia, recently diagnosed depressed ejection fraction EF 25 to 30% with the plan for medical management due to his inability to be able to follow direction for any ischemic evaluation. Developed this in the setting of PNA/sepsis in March. He is a patient of Dr. Servando Salina.   He is here today with his caregiver. He had an EKG done by Dr. Veto Kemps showed LBBB and PVCs and was sent here to assess if he needs an ischemic eval.  No chest pressure. No SOB. No syncope  He had a LBBB 01/02/2023 while tachycardic  He denies CP or SOB. He is euvolemic  Past Medical History:  Diagnosis Date   AKI (acute kidney injury) 06/29/2018   Anemia    Anemia, iron deficiency 09/18/2015   Bowel obstruction    BPH (benign prostatic hyperplasia)    Chronic constipation    History of ileus    Ileus 03/07/2017   Mental retardation    Partial bowel obstruction    Schizophrenia    Monarche every three months;    SIRS (systemic inflammatory response syndrome) 06/29/2018   Small bowel obstruction 06/29/2018    No past surgical history on file.  Current Medications: Current Meds  Medication Sig   amantadine (SYMMETREL) 100 MG capsule Take 100 mg by mouth at bedtime.    aspirin EC (GNP ASPIRIN) 81 MG tablet Take 1 tablet (81 mg total) by mouth daily.   clotrimazole (LOTRIMIN) 1 % cream Apply topically 2 (two) times daily.   cloZAPine (CLOZARIL) 100 MG tablet Take 2 tablets (200 mg total) by mouth 2 (two) times daily.   dapagliflozin propanediol (FARXIGA) 10 MG TABS tablet Take 1 tablet (10 mg total) by mouth daily.    furosemide (LASIX) 40 MG tablet Take 1 tablet (40 mg total) by mouth as needed.   linaclotide (LINZESS) 290 MCG CAPS capsule Take 1 capsule (290 mcg total) by mouth daily before breakfast.   Melatonin 10 MG SUBL Place 10 mg under the tongue at bedtime.   metoCLOPramide (REGLAN) 5 MG tablet Take 1 tablet (5 mg total) by mouth 2 (two) times daily.   metoprolol tartrate (LOPRESSOR) 25 MG tablet Take 0.5 tablets (12.5 mg total) by mouth 2 (two) times daily.   MYRBETRIQ 50 MG TB24 tablet Take 50 mg by mouth at bedtime.   omeprazole (PRILOSEC) 40 MG capsule Take 1 capsule (40 mg total) by mouth at bedtime.   PARoxetine (PAXIL) 40 MG tablet Take 40 mg by mouth every evening.    polyethylene glycol powder (GLYCOLAX/MIRALAX) 17 GM/SCOOP powder Take 17 g by mouth See admin instructions. 17 g twice weekly on Monday and Thursday.   sacubitril-valsartan (ENTRESTO) 24-26 MG Take 1 tablet by mouth 2 (two) times daily.   senna-docusate (SENNA PLUS) 8.6-50 MG tablet Take 1 tablet by mouth 2 (two) times daily.   spironolactone (ALDACTONE) 25 MG tablet Take 0.5 tablets (12.5 mg total) by mouth daily.   tamsulosin (FLOMAX) 0.4 MG CAPS capsule Take 0.4 mg by mouth at bedtime.   zolpidem (  AMBIEN) 5 MG tablet Take 1 tablet (5 mg total) by mouth at bedtime. (Patient taking differently: Take 10 mg by mouth at bedtime.)   [DISCONTINUED] furosemide (LASIX) 40 MG tablet Take 1 tablet (40 mg total) by mouth every other day.     Allergies:   Patient has no known allergies.   Social History   Socioeconomic History   Marital status: Single    Spouse name: Not on file   Number of children: Not on file   Years of education: Not on file   Highest education level: Not on file  Occupational History   Not on file  Tobacco Use   Smoking status: Every Day    Packs/day: 0.25    Years: 30.00    Additional pack years: 0.00    Total pack years: 7.50    Types: Cigarettes   Smokeless tobacco: Never  Vaping Use   Vaping  Use: Never used  Substance and Sexual Activity   Alcohol use: No   Drug use: No   Sexual activity: Never  Other Topics Concern   Not on file  Social History Narrative   Marital status: single      Children: none       Employment: disability due to mental retardation and schizophrenia.      Lives: group home [name]; [facility] [employer] [facility]; 3 men in group home.        Tobacco: six cigarettes per day; since age 38.      Alcohol:  None      Drugs: none      Exercise:  Walking some.      Seatbelt:  100%   Social Determinants of Health   Financial Resource Strain: Low Risk  (10/16/2022)   Overall Financial Resource Strain (CARDIA)    Difficulty of Paying Living Expenses: Not hard at all  Food Insecurity: No Food Insecurity (10/16/2022)   Hunger Vital Sign    Worried About Running Out of Food in the Last Year: Never true    Ran Out of Food in the Last Year: Never true  Transportation Needs: No Transportation Needs (10/16/2022)   PRAPARE - Administrator, Civil Service (Medical): No    Lack of Transportation (Non-Medical): No  Physical Activity: Inactive (10/16/2022)   Exercise Vital Sign    Days of Exercise per Week: 0 days    Minutes of Exercise per Session: 0 min  Stress: No Stress Concern Present (10/16/2022)   [employer] of Occupational Health - Occupational Stress Questionnaire    Feeling of Stress : Not at all  Social Connections: Socially Isolated (10/11/2021)   Social Connection and Isolation Panel [NHANES]    Frequency of Communication with Friends and Family: Twice a week    Frequency of Social Gatherings with Friends and Family: Twice a week    Attends Religious Services: Never    Database administrator or Organizations: No    Attends Engineer, structural: Never    Marital Status: Never married     Family History: The patient's family history includes Cancer in his mother.  ROS:   Please see the history of present  illness.     All other systems reviewed and are negative.  EKGs/Labs/Other Studies Reviewed:    The following studies were reviewed today:   EKG:    Recent Labs: 01/01/2023: B Natriuretic Peptide 985.0 01/04/2023: TSH 0.850 01/05/2023: ALT 200 01/11/2023: Hemoglobin 13.4; Platelets 585.0 01/17/2023: Magnesium 2.6 02/13/2023:  BUN 13; Creatinine, Ser 1.36; Potassium 3.9; Pro B Natriuretic peptide (BNP) 933.0; Sodium 144   Recent Lipid Panel    Component Value Date/Time   CHOL 144 01/31/2021 1000   TRIG 143.0 01/31/2021 1000   HDL 41.40 01/31/2021 1000   CHOLHDL 3 01/31/2021 1000   VLDL 28.6 01/31/2021 1000   LDLCALC 74 01/31/2021 1000     Risk Assessment/Calculations:     Physical Exam:    VS:  BP 90/66 (BP Location: Left Arm, Patient Position: Sitting, Cuff Size: Normal)   Pulse (!) 46   Ht 6\' 2"  (1.88 m)   Wt 193 lb 9.6 oz (87.8 kg)   SpO2 96%   BMI 24.86 kg/m     Wt Readings from Last 3 Encounters:  02/13/23 193 lb 9.6 oz (87.8 kg)  02/13/23 191 lb 3.2 oz (86.7 kg)  01/17/23 186 lb 3.2 oz (84.5 kg)     GEN:  Well nourished, well developed in no acute distress HEENT: Normal NECK: No JVD; No carotid bruits CARDIAC: RRR, no murmurs, rubs, gallops RESPIRATORY:  Clear to auscultation without rales, wheezing or rhonchi  ABDOMEN: Soft, non-tender, non-distended MUSCULOSKELETAL:  No edema; No deformity  SKIN: Warm and dry NEUROLOGIC:  Alert and oriented x 3 PSYCHIATRIC:  Normal affect   ASSESSMENT:    LBBB/PVCs: has hx of rate related LBBB. He has no active CP. No indication for ischemic work up at this time. Does have PVCs. BP too low to uptitrate his BB.   Systolic HF: no prior ischemic evaluation. He did have anterior/ anteroseptal WMA. Possible prior LAD infarct during last hospital admission. Continue GDMT  Hypotension: Bps low with GDMT. He feels LH at times. Will change lasix to PRN  PLAN:    In order of problems listed above:  No changes Continue to  follow with Dr. Servando Salina      Medication Adjustments/Labs and Tests Ordered: Current medicines are reviewed at length with the patient today.  Concerns regarding medicines are outlined above.  No orders of the defined types were placed in this encounter.  Meds ordered this encounter  Medications   furosemide (LASIX) 40 MG tablet    Sig: Take 1 tablet (40 mg total) by mouth as needed.    Dispense:  45 tablet    Refill:  3    Patient Instructions  Medication Instructions:  Your physician has recommended you make the following change in your medication: DECREASE: Lasix 40 mg as needed *If you need a refill on your cardiac medications before your next appointment, please call your pharmacy*   Lab Work: None    Testing/Procedures: None   Follow-Up: At [facility], you and your health needs are our priority.  As part of our continuing mission to provide you with exceptional heart care, we have created designated Provider Care Teams.  These Care Teams include your primary Cardiologist (physician) and Advanced Practice Providers (APPs -  Physician Assistants and Nurse Practitioners) who all work together to provide you with the care you need, when you need it.  We recommend signing up for the patient portal called "MyChart".  Sign up information is provided on this After Visit Summary.  MyChart is used to connect with patients for Virtual Visits (Telemedicine).  Patients are able to view lab/test results, encounter notes, upcoming appointments, etc.  Non-urgent messages can be sent to your provider as well.   To learn more about what you can do with MyChart, go to [email or web address].  Your next appointment:   [id number]  Provider:   Thomasene Ripple, DO        Signed, Maisie Fus, MD  02/13/2023 4:53 PM     [facility]

## 2023-02-13 NOTE — Assessment & Plan Note (Signed)
I continue to strongly recommend against Mr. Jordan Kennedy returning to smoking.

## 2023-02-14 LAB — TROPONIN I: Troponin I: 5 ng/L (ref <=47)

## 2023-02-19 ENCOUNTER — Ambulatory Visit: Payer: Medicare Other | Admitting: General Practice

## 2023-03-15 ENCOUNTER — Ambulatory Visit: Payer: Medicare Other | Admitting: Family Medicine

## 2023-03-19 ENCOUNTER — Encounter: Payer: Self-pay | Admitting: Family Medicine

## 2023-03-19 ENCOUNTER — Ambulatory Visit (INDEPENDENT_AMBULATORY_CARE_PROVIDER_SITE_OTHER): Payer: Medicare Other | Admitting: Family Medicine

## 2023-03-19 VITALS — BP 116/70 | HR 76 | Temp 98.8°F | Ht 74.0 in | Wt 187.8 lb

## 2023-03-19 DIAGNOSIS — K5901 Slow transit constipation: Secondary | ICD-10-CM

## 2023-03-19 DIAGNOSIS — B353 Tinea pedis: Secondary | ICD-10-CM | POA: Diagnosis not present

## 2023-03-19 DIAGNOSIS — F79 Unspecified intellectual disabilities: Secondary | ICD-10-CM | POA: Diagnosis not present

## 2023-03-19 DIAGNOSIS — I5022 Chronic systolic (congestive) heart failure: Secondary | ICD-10-CM | POA: Diagnosis not present

## 2023-03-19 MED ORDER — SENNOSIDES-DOCUSATE SODIUM 8.6-50 MG PO TABS: 1.0000 | ORAL_TABLET | Freq: Two times a day (BID) | ORAL | 3 refills | Status: DC

## 2023-03-19 MED ORDER — CLOTRIMAZOLE 1 % EX CREA: TOPICAL_CREAM | Freq: Two times a day (BID) | CUTANEOUS | 5 refills | Status: DC

## 2023-03-19 NOTE — Assessment & Plan Note (Signed)
Creates some challenges regarding his adherence to medical advice, but overall Jordan Kennedy is doing well. 

## 2023-03-19 NOTE — Assessment & Plan Note (Signed)
Compensated. Weight is down 4-6 lbs from last month. Continue dapagliflozin 10 mg daily, furosemide 40 mg daily as needed, Entresto 24-26 mg bid, metoprolol tartrate 12.5 mg bid, and spironolactone 12.5 mg daily. Strongly advise ongoing tobacco abstinence.

## 2023-03-19 NOTE — Progress Notes (Signed)
[facility] [address] [place] [address] Dept: [phone] Dept Fax: [phone]  Chronic Care Office Visit  Subjective:    Patient ID: Jordan Kennedy, male    DOB: 01-03-1956, 66 y.o..   MRN: [id number]  Chief Complaint  Patient presents with   Medical Management of Chronic Issues    6 month f/u.  No concerns.     History of Present Illness:  Patient is in today for reassessment of chronic medical issues.  [name] has a history of acute HFrEF diagnosed during an admission for CAP and severe sepsis in March. He is managed on dapagliflozin 10 mg daily, furosemide 40 mg daily as needed, Entresto 24-26 mg bid, metoprolol tartrate 12.5 mg bid, and spironolactone 12.5 mg daily. He continues to not smoke though he asks me repeatedly about whether he can go back to smoking. He has not been seeing any swelling to his lower legs. He only needed his lasix once in the past month.  Past Medical History: Patient Active Problem List   Diagnosis Date Noted   Left bundle branch block 02/13/2023   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   Tinea pedis of both feet 01/11/2023   Chronic systolic heart failure (HCC) 01/03/2023   CAP (community acquired pneumonia) 01/02/2023   Severe sepsis (HCC) 01/01/2023   Acute respiratory failure with hypoxia (HCC) 01/01/2023   Prolonged QT interval 01/01/2023   Normocytic anemia 01/01/2023   Bronchitis due to tobacco use 01/01/2023   Transaminitis 01/01/2023   Chronic constipation 01/01/2023   Thrombocytosis 01/01/2023   Acute exacerbation of congestive heart failure (HCC) 01/01/2023   Tardive dyskinesia 11/20/2021   Lower urinary tract symptoms (LUTS) 05/02/2021   History of small bowel obstruction 01/31/2021   History of colon polyps 01/31/2021   Onychomycosis 01/31/2021   Gastroesophageal reflux disease 01/31/2021   Tobacco use disorder, mild, in  early remission 02/01/2015   Constipation due to slow transit 04/25/2012   Anxiety and depression 04/25/2012   Edentulous 04/25/2012   Schizophrenia (HCC)    Intellectual disability    No past surgical history on file. Family History  Problem Relation Age of Onset   Cancer Mother        unknown type   Outpatient Medications Prior to Visit  Medication Sig Dispense Refill   amantadine (SYMMETREL) 100 MG capsule Take 100 mg by mouth at bedtime.      aspirin EC (GNP ASPIRIN) 81 MG tablet Take 1 tablet (81 mg total) by mouth daily. 90 tablet 3   cloZAPine (CLOZARIL) 100 MG tablet Take 2 tablets (200 mg total) by mouth 2 (two) times daily. 120 tablet 0   dapagliflozin propanediol (FARXIGA) 10 MG TABS tablet Take 1 tablet (10 mg total) by mouth daily. 30 tablet 11   furosemide (LASIX) 40 MG tablet Take 1 tablet (40 mg total) by mouth as needed. 45 tablet 3   linaclotide (LINZESS) 290 MCG CAPS capsule Take 1 capsule (290 mcg total) by mouth daily before breakfast. 30 capsule 5   Melatonin 10 MG SUBL Place 10 mg under the tongue at bedtime.     metoCLOPramide (REGLAN) 5 MG tablet Take 1 tablet (5 mg total) by mouth 2 (two) times daily. 60 tablet 11   metoprolol tartrate (LOPRESSOR) 25 MG tablet Take 0.5 tablets (12.5 mg total) by mouth 2 (two) times daily. 30 tablet 11   MYRBETRIQ 50 MG TB24 tablet Take 50 mg  by mouth at bedtime.     nicotine (NICODERM CQ - DOSED IN MG/24 HR) 7 mg/24hr patch Place 1 patch (7 mg total) onto the skin daily. Start after completing 4 weeks on the 14 mg patch 28 patch 0   omeprazole (PRILOSEC) 40 MG capsule Take 1 capsule (40 mg total) by mouth at bedtime. 30 capsule 11   PARoxetine (PAXIL) 40 MG tablet Take 40 mg by mouth every evening.      polyethylene glycol powder (GLYCOLAX/MIRALAX) 17 GM/SCOOP powder Take 17 g by mouth See admin instructions. 17 g twice weekly on Monday and Thursday. 510 g 11   sacubitril-valsartan (ENTRESTO) 24-26 MG Take 1 tablet by mouth 2  (two) times daily. 60 tablet 11   spironolactone (ALDACTONE) 25 MG tablet Take 0.5 tablets (12.5 mg total) by mouth daily. 15 tablet 11   tamsulosin (FLOMAX) 0.4 MG CAPS capsule Take 0.4 mg by mouth at bedtime.     zolpidem (AMBIEN) 5 MG tablet Take 1 tablet (5 mg total) by mouth at bedtime. (Patient taking differently: Take 10 mg by mouth at bedtime.) 30 tablet 5   clotrimazole (LOTRIMIN) 1 % cream Apply topically 2 (two) times daily. 30 g 2   senna-docusate (SENNA PLUS) 8.6-50 MG tablet Take 1 tablet by mouth 2 (two) times daily. 180 tablet 1   No facility-administered medications prior to visit.   No Known Allergies Objective:   Today's Vitals   03/19/23 1002  BP: 116/70  Pulse: 76  Temp: 98.8 F (37.1 C)  TempSrc: Temporal  SpO2: 97%  Weight: 187 lb 12.8 oz (85.2 kg)  Height: 6\' 2"  (1.88 m)   Body mass index is 24.11 kg/m.   General: Well developed, well nourished. No acute distress. Lungs: Clear to auscultation bilaterally. No wheezing, rales or rhonchi. CV: RRR without murmurs or rubs. Pulses 2+ bilaterally. Extremities: No edema noted. Skin: Warm and dry. No rashes. Neuro: CN II-XII intact. Normal sensation and DTR bilaterally. Psych: Alert and oriented. Normal mood and affect.  There are no preventive care reminders to display for this patient.    Assessment & Plan:   Problem List Items Addressed This Visit       Cardiovascular and Mediastinum   Chronic systolic heart failure (HCC) - Primary    Compensated. Weight is down 4-6 lbs from last month. Continue dapagliflozin 10 mg daily, furosemide 40 mg daily as needed, Entresto 24-26 mg bid, metoprolol tartrate 12.5 mg bid, and spironolactone 12.5 mg daily. Strongly advise ongoing tobacco abstinence.        Digestive   Constipation due to slow transit    Stable. Continue current regimen for maintaining regular bowel movements.      Relevant Medications   senna-docusate (SENNA PLUS) 8.6-50 MG tablet      Musculoskeletal and Integument   Tinea pedis of both feet    Continue clotrimazole as needed for athlete's foot.      Relevant Medications   clotrimazole (LOTRIMIN) 1 % cream     Other   Intellectual disability (Chronic)    Creates some challenges regarding his adherence to medical advice, but overall Mr. Mendicino is doing well.       Return in about 3 months (around 06/19/2023) for Reassessment.   Loyola Mast, MD

## 2023-03-19 NOTE — Assessment & Plan Note (Signed)
Stable. Continue current regimen for maintaining regular bowel movements. 

## 2023-03-19 NOTE — Assessment & Plan Note (Signed)
Continue clotrimazole as needed for athlete's foot. 

## 2023-04-18 ENCOUNTER — Telehealth: Payer: Self-pay | Admitting: Family Medicine

## 2023-04-18 NOTE — Telephone Encounter (Signed)
Caller Name: Jenel Lucks, caregiver Call back phone #: [phone]  Reason for Call: wanting Dr. Veto Kemps to discontinue nicotine (NICODERM CQ - DOSED IN MG/24 HR) 7 mg/24hr patch . Caregiver states pt completed course and needs to discontinue.

## 2023-04-18 NOTE — Telephone Encounter (Signed)
Spoke to [place], care taker, they need a letter stating to d/c the Nicoderm patch.  Spoke to provider and is okay to write a letter to d/c due to completion of course.   Will type letter/med list faxed to [phone] [facility].  Dm/cma

## 2023-05-02 ENCOUNTER — Ambulatory Visit (HOSPITAL_COMMUNITY): Payer: Medicare Other | Attending: Cardiology

## 2023-05-02 DIAGNOSIS — I5022 Chronic systolic (congestive) heart failure: Secondary | ICD-10-CM

## 2023-05-02 DIAGNOSIS — R931 Abnormal findings on diagnostic imaging of heart and coronary circulation: Secondary | ICD-10-CM

## 2023-05-02 DIAGNOSIS — R0989 Other specified symptoms and signs involving the circulatory and respiratory systems: Secondary | ICD-10-CM | POA: Diagnosis not present

## 2023-05-02 LAB — ECHOCARDIOGRAM COMPLETE
Area-P 1/2: 2.93 cm2
Est EF: <20
S' Lateral: 5.8 cm

## 2023-05-02 MED ORDER — PERFLUTREN LIPID MICROSPHERE
3.0000 mL | INTRAVENOUS | Status: AC | PRN
Start: 2023-05-02 — End: 2023-05-02
  Administered 2023-05-02: 3 mL via INTRAVENOUS

## 2023-05-09 ENCOUNTER — Ambulatory Visit: Payer: Medicare Other | Attending: Cardiology | Admitting: Cardiology

## 2023-05-09 VITALS — BP 124/62 | HR 68 | Ht 74.0 in | Wt 184.4 lb

## 2023-05-09 DIAGNOSIS — I509 Heart failure, unspecified: Secondary | ICD-10-CM

## 2023-05-09 DIAGNOSIS — R0989 Other specified symptoms and signs involving the circulatory and respiratory systems: Secondary | ICD-10-CM | POA: Insufficient documentation

## 2023-05-09 DIAGNOSIS — I447 Left bundle-branch block, unspecified: Secondary | ICD-10-CM

## 2023-05-09 DIAGNOSIS — I10 Essential (primary) hypertension: Secondary | ICD-10-CM

## 2023-05-09 DIAGNOSIS — R931 Abnormal findings on diagnostic imaging of heart and coronary circulation: Secondary | ICD-10-CM

## 2023-05-09 MED ORDER — METOPROLOL SUCCINATE ER 25 MG PO TB24: 25.0000 mg | ORAL_TABLET | Freq: Every day | ORAL | 3 refills | Status: DC

## 2023-05-09 NOTE — Progress Notes (Signed)
Cardiology Office Note:    Date:  05/09/2023   ID:  Jordan Kennedy, DOB [birth year], MRN [id number]  PCP:  Loyola Mast, MD  Cardiologist:  Thomasene Ripple, DO  Electrophysiologist:  None   Referring MD: Loyola Mast, MD   "I am okay"   History of Present Illness:    Jordan Kennedy is a 67 y.o. male with a hx of cognitive impairment , tardive dyskinesia, recently diagnosed depressed ejection fraction, with the plan for medical management due to his inability to be able to follow direction for any ischemic evaluation.   he is here today for follow-up.  He does not have any complaints.  He is with his caregiver.  He offers no complaints at this time.   Past Medical History:  Diagnosis Date   AKI (acute kidney injury) (HCC) 06/29/2018   Anemia    Anemia, iron deficiency 09/18/2015   Bowel obstruction (HCC)    BPH (benign prostatic hyperplasia)    Chronic constipation    History of ileus    Ileus (HCC) 03/07/2017   Mental retardation    Partial bowel obstruction (HCC)    Schizophrenia (HCC)    Monarche every three months;    SIRS (systemic inflammatory response syndrome) (HCC) 06/29/2018   Small bowel obstruction (HCC) 06/29/2018    No past surgical history on file.  Current Medications: Current Meds  Medication Sig   amantadine (SYMMETREL) 100 MG capsule Take 100 mg by mouth at bedtime.    aspirin EC (GNP ASPIRIN) 81 MG tablet Take 1 tablet (81 mg total) by mouth daily.   clotrimazole (LOTRIMIN) 1 % cream Apply topically 2 (two) times daily.   cloZAPine (CLOZARIL) 100 MG tablet Take 2 tablets (200 mg total) by mouth 2 (two) times daily.   dapagliflozin propanediol (FARXIGA) 10 MG TABS tablet Take 1 tablet (10 mg total) by mouth daily.   furosemide (LASIX) 40 MG tablet Take 1 tablet (40 mg total) by mouth as needed.   linaclotide (LINZESS) 290 MCG CAPS capsule Take 1 capsule (290 mcg total) by mouth daily before breakfast.   Melatonin 10 MG SUBL Place 10 mg under the tongue  at bedtime.   metoCLOPramide (REGLAN) 5 MG tablet Take 1 tablet (5 mg total) by mouth 2 (two) times daily.   metoprolol tartrate (LOPRESSOR) 25 MG tablet Take 0.5 tablets (12.5 mg total) by mouth 2 (two) times daily.   MYRBETRIQ 50 MG TB24 tablet Take 50 mg by mouth at bedtime.   omeprazole (PRILOSEC) 40 MG capsule Take 1 capsule (40 mg total) by mouth at bedtime.   PARoxetine (PAXIL) 40 MG tablet Take 40 mg by mouth every evening.    polyethylene glycol powder (GLYCOLAX/MIRALAX) 17 GM/SCOOP powder Take 17 g by mouth See admin instructions. 17 g twice weekly on Monday and Thursday.   sacubitril-valsartan (ENTRESTO) 24-26 MG Take 1 tablet by mouth 2 (two) times daily.   senna-docusate (SENNA PLUS) 8.6-50 MG tablet Take 1 tablet by mouth 2 (two) times daily.   spironolactone (ALDACTONE) 25 MG tablet Take 0.5 tablets (12.5 mg total) by mouth daily.   tamsulosin (FLOMAX) 0.4 MG CAPS capsule Take 0.4 mg by mouth at bedtime.   zolpidem (AMBIEN) 10 MG tablet Take 10 mg by mouth at bedtime.   zolpidem (AMBIEN) 5 MG tablet Take 1 tablet (5 mg total) by mouth at bedtime. (Patient taking differently: Take 10 mg by mouth at bedtime.)     Allergies:   Patient has no known allergies.  Social History   Socioeconomic History   Marital status: Single    Spouse name: Not on file   Number of children: Not on file   Years of education: Not on file   Highest education level: Not on file  Occupational History   Not on file  Tobacco Use   Smoking status: Every Day    Current packs/day: 0.25    Average packs/day: 0.3 packs/day for 30.0 years (7.5 ttl pk-yrs)    Types: Cigarettes   Smokeless tobacco: Never  Vaping Use   Vaping status: Never Used  Substance and Sexual Activity   Alcohol use: No   Drug use: No   Sexual activity: Never  Other Topics Concern   Not on file  Social History Narrative   Marital status: single      Children: none       Employment: disability due to mental retardation and  schizophrenia.      Lives: group home [name]; [facility]; 3 men in group home.        Tobacco: six cigarettes per day; since age 30.      Alcohol:  None      Drugs: none      Exercise:  Walking some.      Seatbelt:  100%   Social Determinants of Health   Financial Resource Strain: Low Risk  (10/16/2022)   Overall Financial Resource Strain (CARDIA)    Difficulty of Paying Living Expenses: Not hard at all  Food Insecurity: No Food Insecurity (10/16/2022)   Hunger Vital Sign    Worried About Running Out of Food in the Last Year: Never true    Ran Out of Food in the Last Year: Never true  Transportation Needs: No Transportation Needs (10/16/2022)   PRAPARE - Administrator, Civil Service (Medical): No    Lack of Transportation (Non-Medical): No  Physical Activity: Inactive (10/16/2022)   Exercise Vital Sign    Days of Exercise per Week: 0 days    Minutes of Exercise per Session: 0 min  Stress: No Stress Concern Present (10/16/2022)   [employer] of Occupational Health - Occupational Stress Questionnaire    Feeling of Stress : Not at all  Social Connections: Socially Isolated (10/11/2021)   Social Connection and Isolation Panel [NHANES]    Frequency of Communication with Friends and Family: Twice a week    Frequency of Social Gatherings with Friends and Family: Twice a week    Attends Religious Services: Never    Database administrator or Organizations: No    Attends Engineer, structural: Never    Marital Status: Never married     Family History: The patient's family history includes Cancer in his mother.  ROS:   Review of Systems  Constitution: Negative for decreased appetite, fever and weight gain.  HENT: Negative for congestion, ear discharge, hoarse voice and sore throat.   Eyes: Negative for discharge, redness, vision loss in right eye and visual halos.  Cardiovascular: Negative for chest pain, dyspnea on exertion,  leg swelling, orthopnea and palpitations.  Respiratory: Negative for cough, hemoptysis, shortness of breath and snoring.   Endocrine: Negative for heat intolerance and polyphagia.  Hematologic/Lymphatic: Negative for bleeding problem. Does not bruise/bleed easily.  Skin: Negative for flushing, nail changes, rash and suspicious lesions.  Musculoskeletal: Negative for arthritis, joint pain, muscle cramps, myalgias, neck pain and stiffness.  Gastrointestinal: Negative for abdominal pain, bowel incontinence, diarrhea and excessive appetite.  Genitourinary: Negative for decreased libido, genital sores and incomplete emptying.  Neurological: Negative for brief paralysis, focal weakness, headaches and loss of balance.  Psychiatric/Behavioral: Negative for altered mental status, depression and suicidal ideas.  Allergic/Immunologic: Negative for HIV exposure and persistent infections.    EKGs/Labs/Other Studies Reviewed:    The following studies were reviewed today:   EKG:  None today   Reviewed his echocardiogram done on May 02, 2023.  Recent Labs: 01/01/2023: B Natriuretic Peptide 985.0 01/04/2023: TSH 0.850 01/05/2023: ALT 200 01/11/2023: Hemoglobin 13.4; Platelets 585.0 01/17/2023: Magnesium 2.6 02/13/2023: BUN 13; Creatinine, Ser 1.36; Potassium 3.9; Pro B Natriuretic peptide (BNP) 933.0; Sodium 144  Recent Lipid Panel    Component Value Date/Time   CHOL 144 01/31/2021 1000   TRIG 143.0 01/31/2021 1000   HDL 41.40 01/31/2021 1000   CHOLHDL 3 01/31/2021 1000   VLDL 28.6 01/31/2021 1000   LDLCALC 74 01/31/2021 1000    Physical Exam:    VS:  BP 124/62   Pulse 68   Ht 6\' 2"  (1.88 m)   Wt 184 lb 6.4 oz (83.6 kg)   SpO2 100%   BMI 23.68 kg/m     Wt Readings from Last 3 Encounters:  05/09/23 184 lb 6.4 oz (83.6 kg)  03/19/23 187 lb 12.8 oz (85.2 kg)  02/13/23 193 lb 9.6 oz (87.8 kg)     GEN: Well nourished, well developed in no acute distress HEENT: Normal NECK: No JVD; No  carotid bruits LYMPHATICS: No lymphadenopathy CARDIAC: S1S2 noted,RRR, no murmurs, rubs, gallops RESPIRATORY:  Clear to auscultation without rales, wheezing or rhonchi  ABDOMEN: Soft, non-tender, non-distended, +bowel sounds, no guarding. EXTREMITIES: No edema, No cyanosis, no clubbing MUSCULOSKELETAL:  No deformity  SKIN: Warm and dry NEUROLOGIC:  Alert and oriented x 3, non-focal PSYCHIATRIC:  Normal affect, good insight  ASSESSMENT:    No diagnosis found.  PLAN:    He is clinically stable on the guideline directed medical therapy, and is euvolemic. His echocardiogram showed worsening EF.  Will transition the patient from Lopressor to Toprol-XL 25 mg daily.  Keep current dose of his Entresto, Aldactone and [place].   Will refer the patient to our advanced heart failure clinic to help in the management of this patient. Noted left bundle branch block with dyssynchrony on echocardiogram may benefit from cardiac resynchronization therapy-will also refer him to EP for evaluation.    Work will be done for [employer] and mag.  The patient is in agreement with the above plan. The patient left the office in stable condition.  The patient will follow up in 4 months or sooner if needed.   Medication Adjustments/Labs and Tests Ordered: Current medicines are reviewed at length with the patient today.  Concerns regarding medicines are outlined above.  No orders of the defined types were placed in this encounter.  No orders of the defined types were placed in this encounter.   There are no Patient Instructions on file for this visit.   Adopting a Healthy Lifestyle.  Know what a healthy weight is for you (roughly BMI <25) and aim to maintain this   Aim for 7+ servings of fruits and vegetables daily   65-80+ fluid ounces of water or unsweet tea for healthy kidneys   Limit to max 1 drink of alcohol per day; avoid smoking/tobacco   Limit animal fats in diet for cholesterol and heart health -  choose grass fed whenever available   Avoid highly processed foods, and foods high in  saturated/trans fats   Aim for low stress - take time to unwind and care for your mental health   Aim for 150 min of moderate intensity exercise weekly for heart health, and weights twice weekly for bone health   Aim for 7-9 hours of sleep daily   When it comes to diets, agreement about the perfect plan isnt easy to find, even among the experts. Experts at the [facility] of [employer] developed an idea known as the Healthy Eating Plate. Kennedy imagine a plate divided into logical, healthy portions.   The emphasis is on diet quality:   Load up on vegetables and fruits - one-half of your plate: Aim for color and variety, and remember that potatoes dont count.   Go for whole grains - one-quarter of your plate: Whole wheat, barley, wheat berries, quinoa, oats, brown rice, and foods made with them. If you want pasta, go with whole wheat pasta.   Protein power - one-quarter of your plate: Fish, chicken, beans, and nuts are all healthy, versatile protein sources. Limit red meat.   The diet, however, does go beyond the plate, offering a few other suggestions.   Use healthy plant oils, such as olive, canola, soy, corn, sunflower and peanut. Check the labels, and avoid partially hydrogenated oil, which have unhealthy trans fats.   If youre thirsty, drink water. Coffee and tea are good in moderation, but skip sugary drinks and limit milk and dairy products to one or two daily servings.   The type of carbohydrate in the diet is more important than the amount. Some sources of carbohydrates, such as vegetables, fruits, whole grains, and beans-are healthier than others.   Finally, stay active  Signed, Thomasene Ripple, DO  05/09/2023 10:16 AM    [facility]

## 2023-05-09 NOTE — Patient Instructions (Addendum)
Medication Instructions:  STOP LOPRESSOR  START TOPROL 25 MG DAILY  *If you need a refill on your cardiac medications before your next appointment, please call your pharmacy*   Lab Work: CMET MAG If you have labs (blood work) drawn today and your tests are completely normal, you will receive your results only by: MyChart Message (if you have MyChart) OR A paper copy in the mail If you have any lab test that is abnormal or we need to change your treatment, we will call you to review the results.   Testing/Procedures: None needed   Follow-Up: At [facility], you and your health needs are our priority.  As part of our continuing mission to provide you with exceptional heart care, we have created designated Provider Care Teams.  These Care Teams include your primary Cardiologist (physician) and Advanced Practice Providers (APPs -  Physician Assistants and Nurse Practitioners) who all work together to provide you with the care you need, when you need it.  We recommend signing up for the patient portal called "MyChart".  Sign up information is provided on this After Visit Summary.  MyChart is used to connect with patients for Virtual Visits (Telemedicine).  Patients are able to view lab/test results, encounter notes, upcoming appointments, etc.  Non-urgent messages can be sent to your provider as well.   To learn more about what you can do with MyChart, go to [email or web address].    Your next appointment:   6 month(s)  Provider:   Thomasene Ripple, DO     Other Instructions Referral to EP Dr. Girard Cooter

## 2023-05-10 LAB — COMPREHENSIVE METABOLIC PANEL
ALT: 21 IU/L (ref 0–44)
AST: 19 IU/L (ref 0–40)
Albumin: 4.5 g/dL (ref 3.9–4.9)
Alkaline Phosphatase: 124 IU/L — ABNORMAL HIGH (ref 44–121)
BUN/Creatinine Ratio: 13 (ref 10–24)
BUN: 17 mg/dL (ref 8–27)
Bilirubin Total: 0.3 mg/dL (ref 0.0–1.2)
CO2: 24 mmol/L (ref 20–29)
Calcium: 9.6 mg/dL (ref 8.6–10.2)
Chloride: 106 mmol/L (ref 96–106)
Creatinine, Ser: 1.28 mg/dL — ABNORMAL HIGH (ref 0.76–1.27)
Globulin, Total: 2.6 g/dL (ref 1.5–4.5)
Glucose: 84 mg/dL (ref 70–99)
Potassium: 4.4 mmol/L (ref 3.5–5.2)
Sodium: 143 mmol/L (ref 134–144)
Total Protein: 7.1 g/dL (ref 6.0–8.5)
eGFR: 62 mL/min/{1.73_m2} (ref >59)

## 2023-05-10 LAB — MAGNESIUM: Magnesium: 2.3 mg/dL (ref 1.6–2.3)

## 2023-05-15 ENCOUNTER — Telehealth: Payer: Self-pay | Admitting: Cardiology

## 2023-05-15 NOTE — Telephone Encounter (Signed)
Spoke with pt friend Fayrene Fearing, explained his name is not listed pt DPR. Explained if pt can call the office and give [place] verbal permission to speak with him. Fayrene Fearing voiced understanding.

## 2023-05-15 NOTE — Telephone Encounter (Signed)
Pt c/o medication issue:  1. Name of Medication: Metoprolol Tartrate 12.5 MG  2. How are you currently taking this medication (dosage and times per day)?   3. Are you having a reaction (difficulty breathing--STAT)? NO  4. What is your medication issue? Pt's care giver would like a D/C order of medication to be faxed for documentation purposes. He would like a call before being faxed if possible. Please advise

## 2023-05-27 ENCOUNTER — Ambulatory Visit: Payer: Medicare Other | Attending: Cardiology | Admitting: Cardiology

## 2023-05-27 ENCOUNTER — Encounter: Payer: Self-pay | Admitting: Cardiology

## 2023-05-27 VITALS — BP 124/80 | HR 79 | Ht 74.0 in | Wt 184.8 lb

## 2023-05-27 DIAGNOSIS — I5022 Chronic systolic (congestive) heart failure: Secondary | ICD-10-CM | POA: Diagnosis present

## 2023-05-27 NOTE — Patient Instructions (Signed)
Medication Instructions:  Your physician recommends that you continue on your current medications as directed. Please refer to the Current Medication list given to you today.  *If you need a refill on your cardiac medications before your next appointment, please call your pharmacy*   Lab Work: None ordered   Testing/Procedures: None ordered   Follow-Up: At [facility], you and your health needs are our priority.  As part of our continuing mission to provide you with exceptional heart care, we have created designated Provider Care Teams.  These Care Teams include your primary Cardiologist (physician) and Advanced Practice Providers (APPs -  Physician Assistants and Nurse Practitioners) who all work together to provide you with the care you need, when you need it.  Your next appointment:   as  needed  The format for your next appointment:   In Person  Provider:   Will Camnitz, MD    Thank you for choosing [facility]!!    , RN [phone]        

## 2023-05-27 NOTE — Progress Notes (Signed)
  Electrophysiology Office Note:   Date:  05/27/2023  ID:  Jordan Kennedy, DOB 27-Mar-1956, MRN [id number]  Primary Cardiologist: Thomasene Ripple, DO Electrophysiologist: None      History of Present Illness:   Jordan Kennedy is a 67 y.o. male with h/o chronic systolic heart failure seen today for  for Electrophysiology evaluation of CHF at the request of Kardit Tobb.     He has a history of cognitive impairment, schizophrenia, tar dive dyskinesia, tobacco abuse.  He presented to the hospital 01/01/2023 with a nonproductive cough.  He tested negative for COVID.  He developed worsening shortness of breath and fever associated with sinus tachycardia.  He had an echo that showed an ejection fraction of 25 to 30%.  He had significant wall motion abnormalities.    Review of systems complete and found to be negative unless listed in HPI.   EP Information / Studies Reviewed:    EKG is ordered today. Personal review as below.  EKG Interpretation Date/Time:  Monday May 27 2023 15:18:33 EDT Ventricular Rate:  79 PR Interval:  166 QRS Duration:  162 QT Interval:  458 QTC Calculation: 525 R Axis:   -48  Text Interpretation: Sinus rhythm with frequent Premature ventricular complexes Left axis deviation Left bundle branch block When compared with ECG of 06-Jan-2023 12:22, Premature ventricular complexes are now Present Left bundle branch block is now Present Confirmed by ,  ([id number]) on 05/27/2023 3:36:43 PM     Risk Assessment/Calculations:              Physical Exam:   VS:  BP 124/80   Pulse 79   Ht 6\' 2"  (1.88 m)   Wt 184 lb 12.8 oz (83.8 kg)   SpO2 99%   BMI 23.73 kg/m    Wt Readings from Last 3 Encounters:  05/27/23 184 lb 12.8 oz (83.8 kg)  05/09/23 184 lb 6.4 oz (83.6 kg)  03/19/23 187 lb 12.8 oz (85.2 kg)     GEN: Well nourished, well developed in no acute distress NECK: No JVD; No carotid bruits CARDIAC: Regular rate and rhythm, no murmurs, rubs, gallops RESPIRATORY:   Clear to auscultation without rales, wheezing or rhonchi  ABDOMEN: Soft, non-tender, non-distended EXTREMITIES:  No edema; No deformity   ASSESSMENT AND PLAN:    1.  Chronic systolic heart failure: Currently on guideline directed medical therapy per primary cardiology.  His repeat echo shows a persistently low ejection fraction.  He has a left bundle branch block.  He also is having ventricular ectopy with what appears to be high burden of PVCs.  He has not yet worn a cardiac monitor.  He is also not had an ischemic evaluation.  It was thought that he would not benefit from invasive procedures.  For now, due to his intellectual disabilities, we  hold off on invasive procedures.  Per his caregiver, he is the decision maker, and he states that he does not want pacemaker or defibrillator implantation.   discuss with his general cardiologist.  Unclear as to whether or not he would benefit from ischemic evaluation.  2.  Tobacco abuse: Complete cessation encouraged  Follow up with Dr. Elberta Fortis  PRN   Signed,  Jorja Loa, MD

## 2023-06-19 ENCOUNTER — Ambulatory Visit (INDEPENDENT_AMBULATORY_CARE_PROVIDER_SITE_OTHER): Payer: Medicare Other | Admitting: Family Medicine

## 2023-06-19 ENCOUNTER — Encounter: Payer: Self-pay | Admitting: Family Medicine

## 2023-06-19 VITALS — BP 116/68 | HR 96 | Temp 98.3°F | Ht 74.0 in | Wt 180.4 lb

## 2023-06-19 DIAGNOSIS — Z23 Encounter for immunization: Secondary | ICD-10-CM

## 2023-06-19 DIAGNOSIS — I5022 Chronic systolic (congestive) heart failure: Secondary | ICD-10-CM | POA: Diagnosis not present

## 2023-06-19 DIAGNOSIS — F5105 Insomnia due to other mental disorder: Secondary | ICD-10-CM

## 2023-06-19 DIAGNOSIS — F209 Schizophrenia, unspecified: Secondary | ICD-10-CM | POA: Diagnosis not present

## 2023-06-19 DIAGNOSIS — D72819 Decreased white blood cell count, unspecified: Secondary | ICD-10-CM | POA: Diagnosis not present

## 2023-06-19 DIAGNOSIS — K5901 Slow transit constipation: Secondary | ICD-10-CM

## 2023-06-19 DIAGNOSIS — R32 Unspecified urinary incontinence: Secondary | ICD-10-CM

## 2023-06-19 DIAGNOSIS — F99 Mental disorder, not otherwise specified: Secondary | ICD-10-CM

## 2023-06-19 LAB — CBC WITH DIFFERENTIAL/PLATELET
Basophils Absolute: 0 10*3/uL (ref 0.0–0.1)
Basophils Relative: 0.4 % (ref 0.0–3.0)
Eosinophils Absolute: 0.2 10*3/uL (ref 0.0–0.7)
Eosinophils Relative: 2 % (ref 0.0–5.0)
HCT: 43.3 % (ref 39.0–52.0)
Hemoglobin: 13.9 g/dL (ref 13.0–17.0)
Lymphocytes Relative: 24.6 % (ref 12.0–46.0)
Lymphs Abs: 2.4 10*3/uL (ref 0.7–4.0)
MCHC: 32.1 g/dL (ref 30.0–36.0)
MCV: 95.1 fl (ref 78.0–100.0)
Monocytes Absolute: 0.7 10*3/uL (ref 0.1–1.0)
Monocytes Relative: 7.2 % (ref 3.0–12.0)
Neutro Abs: 6.3 10*3/uL (ref 1.4–7.7)
Neutrophils Relative %: 65.8 % (ref 43.0–77.0)
Platelets: 279 10*3/uL (ref 150.0–400.0)
RBC: 4.56 Mil/uL (ref 4.22–5.81)
RDW: 15.7 % — ABNORMAL HIGH (ref 11.5–15.5)
WBC: 9.6 10*3/uL (ref 4.0–10.5)

## 2023-06-19 MED ORDER — ZOLPIDEM TARTRATE 10 MG PO TABS
10.0000 mg | ORAL_TABLET | Freq: Every day | ORAL | 5 refills | Status: DC
Start: 2023-06-19 — End: 2024-06-17

## 2023-06-19 MED ORDER — LINACLOTIDE 290 MCG PO CAPS
290.0000 ug | ORAL_CAPSULE | Freq: Every day | ORAL | 5 refills | Status: DC
Start: 2023-06-19 — End: 2024-01-20

## 2023-06-19 NOTE — Assessment & Plan Note (Signed)
Renew Ambien 10 mg nightly.

## 2023-06-19 NOTE — Assessment & Plan Note (Signed)
Stable. Continue to follow with by Dr. Jannifer Franklin (psychiatry) regarding current psych meds.

## 2023-06-19 NOTE — Progress Notes (Signed)
[facility] [address] [place] [address] Dept: [phone] Dept Fax: [phone]  Chronic Care Office Visit  Subjective:    Patient ID: Jordan Kennedy, male    DOB: 07-05-1956, 67 y.o..   MRN: [id number]  Chief Complaint  Patient presents with   Hypertension    3 month f/u.     History of Present Illness:  Patient is in today for reassessment of chronic medical issues. Jordan Kennedy presents with his case worker, Jordan Kennedy. He currently lives in a group home.    Jordan Kennedy has a history of schizophrenia, anxiety with depression, and intellectual disability. He is currently followed by Jordan Kennedy (psychiatry). He is managed on clozapine, paroxetine, and amantadine. Jordan Kennedy notes a recent CBC had shown a low white blood cell count.   Jordan Kennedy has insomnia issues and is managed on Ambien 10 mg and melatonin.   Jordan Kennedy has a history of constipation issues related to slow transit and prior history of bowel obstruction. He does have linaclotide (Linzess) 290 mcg, Miralax, and senna-docusate available for management of this. He notes his bowels are moving regularly at this point without complaints.   Jordan Kennedy has a history of bladder outlet issues. He is currently managed on Flomax 0.4 mg daily and Mybetriq 50 mg daily. He has had issues since his hospitalization earlier this year with occasional nocturnal enuresis.   Jordan Kennedy has a history of acute HFrEF diagnosed during an admission for CAP and severe sepsis in March 2024. He is managed on dapagliflozin 10 mg daily, furosemide 40 mg daily as needed, Entresto 24-26 mg bid, metoprolol tartrate 12.5 mg bid, and spironolactone 12.5 mg daily. He continues to abstain from smoking, though he asks me repeatedly about whether he can go back to smoking. He has not been seeing any swelling to his lower legs and has not complained of shortness of breath. The home is monitoring his  weight and his only infrequently needing furosemide.  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   Tinea pedis of both feet 01/11/2023   Chronic systolic heart failure (HCC) 01/03/2023   Prolonged QT interval 01/01/2023   Normocytic anemia 01/01/2023   Transaminitis 01/01/2023   Chronic constipation 01/01/2023   Thrombocytosis 01/01/2023   Tardive dyskinesia 11/20/2021   Lower urinary tract symptoms (LUTS) 05/02/2021   History of small bowel obstruction 01/31/2021   History of colon polyps 01/31/2021   Onychomycosis 01/31/2021   Gastroesophageal reflux disease 01/31/2021   Tobacco use disorder, mild, in sustained remission 02/01/2015   Constipation due to slow transit 04/25/2012   Anxiety and depression 04/25/2012   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 No pertinent surgical history. Family History  Problem Relation Age of Onset   Cancer Mother        unknown type   Outpatient Medications Prior to Visit  Medication Sig Dispense Refill   amantadine (SYMMETREL) 100 MG capsule Take 100 mg by mouth at bedtime.      aspirin EC (GNP ASPIRIN) 81 MG tablet Take 1 tablet (81 mg total) by mouth daily. 90 tablet 3   clotrimazole (LOTRIMIN) 1 % cream Apply topically 2 (two) times daily. 30 g 5   cloZAPine (CLOZARIL) 100 MG  tablet Take 2 tablets (200 mg total) by mouth 2 (two) times daily. 120 tablet 0   dapagliflozin propanediol (FARXIGA) 10 MG TABS tablet Take 1 tablet (10 mg total) by mouth daily. 30 tablet 11   furosemide (LASIX) 40 MG tablet Take 1 tablet (40 mg total) by mouth as needed. 45 tablet 3   Melatonin 10 MG SUBL Place 10 mg under the tongue at bedtime.     metoCLOPramide (REGLAN) 5 MG tablet Take 1 tablet (5 mg total) by mouth 2 (two) times daily. 60  tablet 11   metoprolol succinate (TOPROL XL) 25 MG 24 hr tablet Take 1 tablet (25 mg total) by mouth daily. 90 tablet 3   MYRBETRIQ 50 MG TB24 tablet Take 50 mg by mouth at bedtime.     omeprazole (PRILOSEC) 40 MG capsule Take 1 capsule (40 mg total) by mouth at bedtime. 30 capsule 11   PARoxetine (PAXIL) 40 MG tablet Take 40 mg by mouth every evening.      polyethylene glycol powder (GLYCOLAX/MIRALAX) 17 GM/SCOOP powder Take 17 g by mouth See admin instructions. 17 g twice weekly on Monday and Thursday. 510 g 11   sacubitril-valsartan (ENTRESTO) 24-26 MG Take 1 tablet by mouth 2 (two) times daily. 60 tablet 11   senna-docusate (SENNA PLUS) 8.6-50 MG tablet Take 1 tablet by mouth 2 (two) times daily. 180 tablet 3   spironolactone (ALDACTONE) 25 MG tablet Take 0.5 tablets (12.5 mg total) by mouth daily. 15 tablet 11   tamsulosin (FLOMAX) 0.4 MG CAPS capsule Take 0.4 mg by mouth at bedtime.     linaclotide (LINZESS) 290 MCG CAPS capsule Take 1 capsule (290 mcg total) by mouth daily before breakfast. 30 capsule 5   zolpidem (AMBIEN) 10 MG tablet Take 10 mg by mouth at bedtime.     zolpidem (AMBIEN) 5 MG tablet Take 1 tablet (5 mg total) by mouth at bedtime. (Patient taking differently: Take 10 mg by mouth at bedtime.) 30 tablet 5   No facility-administered medications prior to visit.   No Known Allergies Objective:   Today's Vitals   06/19/23 0852  BP: 116/68  Pulse: 96  Temp: 98.3 F (36.8 C)  TempSrc: Temporal  SpO2: 99%  Weight: 180 lb 6.4 oz (81.8 kg)  Height: 6\' 2"  (1.88 m)   Body mass index is 23.16 kg/m.   General: Well developed, well nourished. No acute distress. Lungs: Clear to auscultation bilaterally. No wheezing, rales or rhonchi. CV: RRR without murmurs or rubs. Pulses 2+ bilaterally. Extremities: No edema noted. Psych: Alert and oriented. Normal mood and affect.  Health Maintenance Due  Topic Date Due   Zoster Vaccines- Shingrix (1 of 2) Never done   INFLUENZA  VACCINE  05/23/2023     Assessment & Plan:   Problem List Items Addressed This Visit       Cardiovascular and Mediastinum   Chronic systolic heart failure (HCC) - Primary    Compensated. Weight is down 7 lbs since May. Continue dapagliflozin 10 mg daily, furosemide 40 mg daily as needed, Entresto 24-26 mg bid, metoprolol tartrate 12.5 mg bid, and spironolactone 12.5 mg daily. Strongly advise ongoing tobacco abstinence.        Digestive   Constipation due to slow transit    Stable. Continue current regimen for maintaining regular bowel movements. and use of linactolide (Linzess)      Relevant Medications   linaclotide (LINZESS) 290 MCG CAPS capsule     Other   Schizophrenia (HCC) (  Chronic)    Stable. Continue to follow with by Jordan Kennedy (psychiatry) regarding current psych meds.      Enuresis    I will prescribe some adult diapers for nighttime use.      Relevant Orders   For home use only DME Other see comment   Insomnia due to other mental disorder    Renew Ambien 10 mg nightly.      Relevant Medications   zolpidem (AMBIEN) 10 MG tablet   Other Visit Diagnoses     Leukopenia, unspecified type       I will check a CBC to assess the issue of low WBC count.   Relevant Orders   CBC with Differential/Platelet   Need for immunization against influenza       Relevant Orders   Flu Vaccine QUAD 6+ mos PF IM (Fluarix Quad PF) (Completed)       Return in about 3 months (around 09/19/2023) for Reassessment.   Loyola Mast, MD

## 2023-06-19 NOTE — Assessment & Plan Note (Signed)
I will prescribe some adult diapers for nighttime use.

## 2023-06-19 NOTE — Assessment & Plan Note (Signed)
Compensated. Weight is down 7 lbs since May. Continue dapagliflozin 10 mg daily, furosemide 40 mg daily as needed, Entresto 24-26 mg bid, metoprolol tartrate 12.5 mg bid, and spironolactone 12.5 mg daily. Strongly advise ongoing tobacco abstinence.

## 2023-06-19 NOTE — Assessment & Plan Note (Signed)
Stable. Continue current regimen for maintaining regular bowel movements. and use of linactolide (Linzess)

## 2023-06-25 ENCOUNTER — Encounter (HOSPITAL_COMMUNITY): Payer: Self-pay | Admitting: *Deleted

## 2023-06-25 ENCOUNTER — Ambulatory Visit (HOSPITAL_COMMUNITY)
Admission: EM | Admit: 2023-06-25 | Discharge: 2023-06-25 | Disposition: A | Discharge status: Home / Self Care | Payer: Medicare Other | Attending: Family Medicine | Admitting: Family Medicine

## 2023-06-25 DIAGNOSIS — I951 Orthostatic hypotension: Secondary | ICD-10-CM | POA: Diagnosis not present

## 2023-06-25 DIAGNOSIS — R81 Glycosuria: Secondary | ICD-10-CM | POA: Diagnosis present

## 2023-06-25 DIAGNOSIS — R42 Dizziness and giddiness: Secondary | ICD-10-CM | POA: Insufficient documentation

## 2023-06-25 HISTORY — DX: Chronic systolic (congestive) heart failure: I50.22

## 2023-06-25 LAB — POCT URINALYSIS DIP (MANUAL ENTRY)
Glucose, UA: >=1000 mg/dL — AB
Leukocytes, UA: NEGATIVE
Nitrite, UA: NEGATIVE
Protein Ur, POC: 30 mg/dL — AB
Spec Grav, UA: 1.02 (ref 1.010–1.025)
Urobilinogen, UA: 1 U/dL
pH, UA: 5 (ref 5.0–8.0)

## 2023-06-25 LAB — COMPREHENSIVE METABOLIC PANEL
ALT: 23 U/L (ref 0–44)
AST: 25 U/L (ref 15–41)
Albumin: 3.8 g/dL (ref 3.5–5.0)
Alkaline Phosphatase: 84 U/L (ref 38–126)
Anion gap: 13 (ref 5–15)
BUN: 18 mg/dL (ref 8–23)
CO2: 23 mmol/L (ref 22–32)
Calcium: 9.2 mg/dL (ref 8.9–10.3)
Chloride: 101 mmol/L (ref 98–111)
Creatinine, Ser: 1.63 mg/dL — ABNORMAL HIGH (ref 0.61–1.24)
GFR, Estimated: 47 mL/min — ABNORMAL LOW (ref >60)
Glucose, Bld: 97 mg/dL (ref 70–99)
Potassium: 3.5 mmol/L (ref 3.5–5.1)
Sodium: 137 mmol/L (ref 135–145)
Total Bilirubin: 0.7 mg/dL (ref 0.3–1.2)
Total Protein: 7.1 g/dL (ref 6.5–8.1)

## 2023-06-25 LAB — CBC WITH DIFFERENTIAL/PLATELET
Abs Immature Granulocytes: 0.08 10*3/uL — ABNORMAL HIGH (ref 0.00–0.07)
Basophils Absolute: 0.1 10*3/uL (ref 0.0–0.1)
Basophils Relative: 0 %
Eosinophils Absolute: 0.1 10*3/uL (ref 0.0–0.5)
Eosinophils Relative: 1 %
HCT: 41.8 % (ref 39.0–52.0)
Hemoglobin: 13.3 g/dL (ref 13.0–17.0)
Immature Granulocytes: 1 %
Lymphocytes Relative: 10 %
Lymphs Abs: 1.4 10*3/uL (ref 0.7–4.0)
MCH: 30 pg (ref 26.0–34.0)
MCHC: 31.8 g/dL (ref 30.0–36.0)
MCV: 94.1 fL (ref 80.0–100.0)
Monocytes Absolute: 1.3 10*3/uL — ABNORMAL HIGH (ref 0.1–1.0)
Monocytes Relative: 9 %
Neutro Abs: 11.2 10*3/uL — ABNORMAL HIGH (ref 1.7–7.7)
Neutrophils Relative %: 79 %
Platelets: 251 10*3/uL (ref 150–400)
RBC: 4.44 MIL/uL (ref 4.22–5.81)
RDW: 16.7 % — ABNORMAL HIGH (ref 11.5–15.5)
WBC: 14.1 10*3/uL — ABNORMAL HIGH (ref 4.0–10.5)
nRBC: 0 % (ref 0.0–0.2)

## 2023-06-25 LAB — POCT FASTING CBG KUC MANUAL ENTRY: POCT Glucose (KUC): 118 mg/dL — AB (ref 70–99)

## 2023-06-25 LAB — MAGNESIUM: Magnesium: 2.1 mg/dL (ref 1.7–2.4)

## 2023-06-25 NOTE — ED Provider Notes (Signed)
[facility]    CSN: [id number] Arrival date & time: 06/25/23  1012      History   Chief Complaint Chief Complaint  Patient presents with   Fatigue   Dizziness    HPI Jordan Kennedy is a 67 y.o. male.    Dizziness  Patient is here with a guardian.  He lives in a group home.   Today at the podiatrist he stated that he felt weak, dizzy while walking in and out of the office.  This morning upon awaking he felt fine.  He ate breakfast, took medications as usual (he does not self administer these) and didn't feel poorly until he was at the drs office.  He feels a little weak, but also a little better.  No runny nose, congestion, or drainage.  No cough, no fevers/chills.   He had belly pain the other day, but none today.  No diarrhea or constipation.  No painful urination or problems with urination.  No chest pain or palpitations.  He did not fall recently, no injuries.        Past Medical History:  Diagnosis Date   AKI (acute kidney injury) (HCC) 06/29/2018   Anemia    Anemia, iron deficiency 09/18/2015   Bowel obstruction (HCC)    BPH (benign prostatic hyperplasia)    Chronic constipation    Chronic systolic heart failure (HCC)    History of ileus    Ileus (HCC) 03/07/2017   Mental retardation    Partial bowel obstruction (HCC)    Schizophrenia (HCC)    Monarche every three months;    SIRS (systemic inflammatory response syndrome) (HCC) 06/29/2018   Small bowel obstruction (HCC) 06/29/2018    Patient Active Problem List   Diagnosis Date Noted   Enuresis 06/19/2023   Left bundle branch block 02/13/2023   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   Tinea pedis of both feet 01/11/2023   Chronic systolic heart failure (HCC) 01/03/2023   Prolonged QT interval 01/01/2023   Normocytic anemia 01/01/2023   Transaminitis 01/01/2023   Chronic constipation 01/01/2023   Thrombocytosis 01/01/2023   Tardive dyskinesia  11/20/2021   Lower urinary tract symptoms (LUTS) 05/02/2021   History of small bowel obstruction 01/31/2021   History of colon polyps 01/31/2021   Onychomycosis 01/31/2021   Gastroesophageal reflux disease 01/31/2021   Tobacco use disorder, mild, 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 No pertinent surgical history.     Home Medications    Prior to Admission medications   Medication Sig Start Date End Date Taking? Authorizing Provider  amantadine (SYMMETREL) 100 MG capsule Take 100 mg by mouth at bedtime.    Yes [provider]  aspirin EC (GNP ASPIRIN) 81 MG tablet Take 1 tablet (81 mg total) by mouth daily. 09/18/22  Yes Loyola Mast, MD  clotrimazole (LOTRIMIN) 1 % cream Apply topically 2 (two) times daily. 03/19/23  Yes Loyola Mast, MD  cloZAPine (CLOZARIL) 100 MG tablet Take 2 tablets (200 mg total) by mouth 2 (two) times daily. 03/12/17  Yes Rodolph Bong, MD  dapagliflozin propanediol (FARXIGA) 10 MG TABS tablet Take 1 tablet (10 mg total) by mouth daily. 01/11/23  Yes Loyola Mast, MD  furosemide (LASIX) 40 MG tablet Take 1 tablet (40 mg  total) by mouth as needed. 02/13/23  Yes Maisie Fus, MD  linaclotide [facility]) 290 MCG CAPS capsule Take 1 capsule (290 mcg total) by mouth daily before breakfast. 06/19/23  Yes Loyola Mast, MD  Melatonin 10 MG SUBL Place 10 mg under the tongue at bedtime.   Yes [provider]  metoCLOPramide (REGLAN) 5 MG tablet Take 1 tablet (5 mg total) by mouth 2 (two) times daily. 01/11/23  Yes Loyola Mast, MD  metoprolol succinate (TOPROL XL) 25 MG 24 hr tablet Take 1 tablet (25 mg total) by mouth daily. 05/09/23  Yes Tobb, Kardie, DO  MYRBETRIQ 50 MG TB24 tablet Take 50 mg by mouth at bedtime. 01/06/21  Yes [provider]  omeprazole  (PRILOSEC) 40 MG capsule Take 1 capsule (40 mg total) by mouth at bedtime. 01/11/23  Yes Loyola Mast, MD  PARoxetine (PAXIL) 40 MG tablet Take 40 mg by mouth every evening.    Yes [provider]  polyethylene glycol powder (GLYCOLAX/MIRALAX) 17 GM/SCOOP powder Take 17 g by mouth See admin instructions. 17 g twice weekly on Monday and Thursday. 01/11/23  Yes Loyola Mast, MD  sacubitril-valsartan (ENTRESTO) 24-26 MG Take 1 tablet by mouth 2 (two) times daily. 01/11/23  Yes Loyola Mast, MD  senna-docusate (SENNA PLUS) 8.6-50 MG tablet Take 1 tablet by mouth 2 (two) times daily. 03/19/23  Yes Loyola Mast, MD  spironolactone (ALDACTONE) 25 MG tablet Take 0.5 tablets (12.5 mg total) by mouth daily. 01/11/23  Yes Loyola Mast, MD  tamsulosin (FLOMAX) 0.4 MG CAPS capsule Take 0.4 mg by mouth at bedtime. 01/10/21  Yes [provider]  zolpidem (AMBIEN) 10 MG tablet Take 1 tablet (10 mg total) by mouth at bedtime. 06/19/23  Yes RuddBertram Millard, MD    Family History Family History  Problem Relation Age of Onset   Cancer Mother        unknown type    Social History Social History   Tobacco Use   Smoking status: Every Day    Current packs/day: 0.25    Average packs/day: 0.3 packs/day for 30.0 years (7.5 ttl pk-yrs)    Types: Cigarettes   Smokeless tobacco: Never  Vaping Use   Vaping status: Never Used  Substance Use Topics   Alcohol use: No   Drug use: No     Allergies   Patient has no known allergies.   Review of Systems Review of Systems  Constitutional:  Positive for fatigue.  HENT: Negative.    Respiratory: Negative.    Cardiovascular: Negative.   Gastrointestinal: Negative.   Genitourinary: Negative.   Musculoskeletal: Negative.   Neurological:  Positive for dizziness.  Psychiatric/Behavioral: Negative.       Physical Exam Triage Vital Signs ED Triage Vitals  Encounter Vitals Group     BP 06/25/23 1111 92/66     Systolic BP Percentile --       Diastolic BP Percentile --      Pulse Rate 06/25/23 1111 99     Resp 06/25/23 1111 18     Temp 06/25/23 1111 98.6 F (37 C)     Temp Source 06/25/23 1111 Oral     SpO2 06/25/23 1111 97 %     Weight --      Height --      Head Circumference --      Peak Flow --      Pain Score 06/25/23 1109 0     Pain  Loc --      Pain Education --      Exclude from Growth Chart --    No data found.  Updated Vital Signs BP 92/66 (BP Location: Right Arm)   Pulse 99   Temp 98.6 F (37 C) (Oral)   Resp 18   SpO2 97%   Visual Acuity Right Eye Distance:   Left Eye Distance:   Bilateral Distance:    Right Eye Near:   Left Eye Near:    Bilateral Near:     Physical Exam Constitutional:      Appearance: Normal appearance. He is toxic-appearing. He is not ill-appearing.  HENT:     Head: Normocephalic and atraumatic.     Nose: Nose normal.     Mouth/Throat:     Mouth: Mucous membranes are moist.  Cardiovascular:     Rate and Rhythm: Normal rate and regular rhythm.  Pulmonary:     Effort: Pulmonary effort is normal.     Breath sounds: Normal breath sounds.  Abdominal:     Palpations: Abdomen is soft.     Tenderness: There is no abdominal tenderness. There is no guarding or rebound.  Musculoskeletal:        General: Normal range of motion.     Cervical back: Normal range of motion and neck supple.  Skin:    General: Skin is warm and dry.  Neurological:     General: No focal deficit present.     Mental Status: He is alert and oriented to person, place, and time.  Psychiatric:        Mood and Affect: Mood normal.        Behavior: Behavior normal.      UC Treatments / Results  Labs (all labs ordered are listed, but only abnormal results are displayed) Labs Reviewed  POCT URINALYSIS DIP (MANUAL ENTRY) - Abnormal; Notable for the following components:      Result Value   Color, UA brown (*)    Clarity, UA cloudy (*)    Glucose, UA >=1,000 (*)    Bilirubin, UA small (*)     Ketones, POC UA trace (5) (*)    Blood, UA moderate (*)    Protein Ur, POC =30 (*)    All other components within normal limits  POCT FASTING CBG [facility] MANUAL ENTRY - Abnormal; Notable for the following components:   POCT Glucose ([facility]) 118 (*)    All other components within normal limits  URINE CULTURE  COMPREHENSIVE METABOLIC PANEL  CBC WITH DIFFERENTIAL/PLATELET  MAGNESIUM    EKG Sinus; LBBB; unchanged from previous  Radiology No results found.  Procedures Procedures (including critical care time)  Medications Ordered in UC Medications - No data to display  Initial Impression / Assessment and Plan / UC Course  I have reviewed the triage vital signs and the nursing notes.  Pertinent labs & imaging results that were available during my care of the patient were reviewed by me and considered in my medical decision making (see chart for details).    Final Clinical Impressions(s) / UC Diagnoses   Final diagnoses:  Orthostatic hypotension  Dizziness  Glucosuria     Discharge Instructions      He was seen today for dizziness.  This is likely due to a low blood pressure, although I am not sure what is causing that.  I have ordered blood work, and will be in touch later today with with results.  If there is anything very  abnormal or concerning he may have to go to the ER.  Please go to the ER if he has worsening symptoms or new/changing symptoms.  He may eat/drink normally.  Please hold his lasix for a bp <110/70.  Please follow up with his primary provider or cardiologist as well.     ED Prescriptions   None    PDMP not reviewed this encounter.   Jannifer Franklin, MD 06/25/23 1218

## 2023-06-25 NOTE — ED Triage Notes (Signed)
Pt states when he woke up this morning he was ok . He went to the foot doctor and he felt fatigued and dizzy all of a sudden. He is here today with his caretaker. They dont have med list today and are unsure of meds.

## 2023-06-25 NOTE — Discharge Instructions (Signed)
He was seen today for dizziness.  This is likely due to a low blood pressure, although I am not sure what is causing that.  I have ordered blood work, and will be in touch later today with with results.  If there is anything very abnormal or concerning he may have to go to the ER.  Please go to the ER if he has worsening symptoms or new/changing symptoms.  He may eat/drink normally.  Please hold his lasix for a bp <110/70.  Please follow up with his primary provider or cardiologist as well.

## 2023-06-26 ENCOUNTER — Emergency Department (HOSPITAL_COMMUNITY): Payer: Medicare Other

## 2023-06-26 ENCOUNTER — Emergency Department (HOSPITAL_COMMUNITY)
Admission: EM | Admit: 2023-06-26 | Discharge: 2023-06-26 | Disposition: A | Discharge status: Home / Self Care | Payer: Medicare Other | Attending: Emergency Medicine | Admitting: Emergency Medicine

## 2023-06-26 ENCOUNTER — Telehealth: Payer: Self-pay | Admitting: Cardiology

## 2023-06-26 ENCOUNTER — Other Ambulatory Visit: Payer: Self-pay

## 2023-06-26 DIAGNOSIS — Z7982 Long term (current) use of aspirin: Secondary | ICD-10-CM | POA: Insufficient documentation

## 2023-06-26 DIAGNOSIS — R42 Dizziness and giddiness: Secondary | ICD-10-CM | POA: Insufficient documentation

## 2023-06-26 DIAGNOSIS — D72829 Elevated white blood cell count, unspecified: Secondary | ICD-10-CM | POA: Insufficient documentation

## 2023-06-26 DIAGNOSIS — R7989 Other specified abnormal findings of blood chemistry: Secondary | ICD-10-CM | POA: Insufficient documentation

## 2023-06-26 DIAGNOSIS — I959 Hypotension, unspecified: Secondary | ICD-10-CM

## 2023-06-26 DIAGNOSIS — R531 Weakness: Secondary | ICD-10-CM | POA: Diagnosis not present

## 2023-06-26 DIAGNOSIS — I951 Orthostatic hypotension: Secondary | ICD-10-CM | POA: Diagnosis not present

## 2023-06-26 DIAGNOSIS — R5383 Other fatigue: Secondary | ICD-10-CM | POA: Insufficient documentation

## 2023-06-26 DIAGNOSIS — Z1152 Encounter for screening for COVID-19: Secondary | ICD-10-CM | POA: Insufficient documentation

## 2023-06-26 DIAGNOSIS — I509 Heart failure, unspecified: Secondary | ICD-10-CM | POA: Diagnosis not present

## 2023-06-26 LAB — CBC
HCT: 39.8 % (ref 39.0–52.0)
Hemoglobin: 13 g/dL (ref 13.0–17.0)
MCH: 30.7 pg (ref 26.0–34.0)
MCHC: 32.7 g/dL (ref 30.0–36.0)
MCV: 94.1 fL (ref 80.0–100.0)
Platelets: 231 10*3/uL (ref 150–400)
RBC: 4.23 MIL/uL (ref 4.22–5.81)
RDW: 16.4 % — ABNORMAL HIGH (ref 11.5–15.5)
WBC: 13.3 10*3/uL — ABNORMAL HIGH (ref 4.0–10.5)
nRBC: 0 % (ref 0.0–0.2)

## 2023-06-26 LAB — URINE CULTURE: Culture: <10000 — AB

## 2023-06-26 LAB — URINALYSIS, ROUTINE W REFLEX MICROSCOPIC
Bacteria, UA: NONE SEEN
Bilirubin Urine: NEGATIVE
Glucose, UA: >=500 mg/dL — AB
Ketones, ur: 5 mg/dL — AB
Leukocytes,Ua: NEGATIVE
Nitrite: NEGATIVE
Protein, ur: 30 mg/dL — AB
Specific Gravity, Urine: 1.027 (ref 1.005–1.030)
pH: 5 (ref 5.0–8.0)

## 2023-06-26 LAB — BASIC METABOLIC PANEL
Anion gap: 13 (ref 5–15)
BUN: 18 mg/dL (ref 8–23)
CO2: 22 mmol/L (ref 22–32)
Calcium: 9.2 mg/dL (ref 8.9–10.3)
Chloride: 103 mmol/L (ref 98–111)
Creatinine, Ser: 1.47 mg/dL — ABNORMAL HIGH (ref 0.61–1.24)
GFR, Estimated: 53 mL/min — ABNORMAL LOW (ref >60)
Glucose, Bld: 112 mg/dL — ABNORMAL HIGH (ref 70–99)
Potassium: 3.8 mmol/L (ref 3.5–5.1)
Sodium: 138 mmol/L (ref 135–145)

## 2023-06-26 LAB — RESP PANEL BY RT-PCR (RSV, FLU A&B, COVID)  RVPGX2
Influenza A by PCR: NEGATIVE
Influenza B by PCR: NEGATIVE
Resp Syncytial Virus by PCR: NEGATIVE
SARS Coronavirus 2 by RT PCR: NEGATIVE

## 2023-06-26 LAB — D-DIMER, QUANTITATIVE: D-Dimer, Quant: 0.75 ug{FEU}/mL — ABNORMAL HIGH (ref 0.00–0.50)

## 2023-06-26 LAB — CBG MONITORING, ED: Glucose-Capillary: 106 mg/dL — ABNORMAL HIGH (ref 70–99)

## 2023-06-26 LAB — BRAIN NATRIURETIC PEPTIDE: B Natriuretic Peptide: 290 pg/mL — ABNORMAL HIGH (ref 0.0–100.0)

## 2023-06-26 LAB — TROPONIN I (HIGH SENSITIVITY): Troponin I (High Sensitivity): 14 ng/L (ref <18)

## 2023-06-26 MED ORDER — IOHEXOL 350 MG/ML SOLN
75.0000 mL | Freq: Once | INTRAVENOUS | Status: AC | PRN
  Administered 2023-06-26: 75 mL via INTRAVENOUS

## 2023-06-26 MED ORDER — LACTATED RINGERS IV BOLUS
1000.0000 mL | Freq: Once | INTRAVENOUS | Status: AC
  Administered 2023-06-26: 1000 mL via INTRAVENOUS

## 2023-06-26 NOTE — Telephone Encounter (Signed)
Called to [facility]. Patient went to ED for low BP yesterday. States this morning BP was 84/54 this am after medication. He states unsure of hydration. He has asked for more water the last day or two.  No HR taken. Took meds this morning and BP was after meds.  States it was 75/65 before medication.   Currently feeling weak at the moment. Slightly dizzy. No SOB. Lasix not taken as it is PRN.  All other meds taken as directed  Advised slow position changes, increase hydration. Careful with walking if dizzy.  If dizziness increases to point he feels he will pass out, or SOB, then he needs [place] to ED.   He is to call back at noon to see if BP is still low or dropping due to having taken his morning meds with an already low BP.  He does have ER visit and Labs have resulted for review.

## 2023-06-26 NOTE — Telephone Encounter (Signed)
Returned call to pt with Dr. Mallory Shirk recommendation below. Advised pt caregiver of pts appt on 07/05/23 at 8:25 am. Verbalized understanding.    Per Dr. Servando Salina- Stop the aldactone, have them see one the APPs soon please

## 2023-06-26 NOTE — ED Provider Notes (Signed)
  Physical Exam  BP 107/75 (BP Location: Right Arm)   Pulse 85   Temp 98.2 F (36.8 C) (Oral)   Resp 19   SpO2 99%   Physical Exam  Procedures  Procedures  ED Course / MDM    Medical Decision Making Care assumed at 3 pm.  Patient is here with hypotension and dizziness and has elevated D-dimer.  Signout pending CTA chest.  6:30 PM CTA chest showed no PE.  Patient has some esophagitis.  Patient is already on Nexium.  Patient blood pressure came up to 107/75.  Stable for discharge.  I reviewed his medicines and patient is not on any BP meds.  However he is on spironolactone and encouraged him to stop taking it for now.  Problems Addressed: Fatigue, unspecified type: acute illness or injury Hypotension, unspecified hypotension type: acute illness or injury Weakness: acute illness or injury  Amount and/or Complexity of Data Reviewed Labs: ordered. Radiology: ordered.  Risk Prescription drug management.          Charlynne Pander, MD 06/26/23 [phone]

## 2023-06-26 NOTE — Discharge Instructions (Signed)
As we discussed, your blood pressure was low.  I recommend that you stopped taking spironolactone  Please follow-up with your cardiologist  Return to ER if you have worse dizziness or passing out or chest pain or shortness of breath

## 2023-06-26 NOTE — Telephone Encounter (Signed)
Patient is already on the 12.5 mg dose of the aldactone.  However his friend went ahead and took him to the ED  he states "as a precautions".  Advised to go by their recommendations but also if BP is less than 90/60 to hold BP medication and call us.  Please advise any further recommendations

## 2023-06-26 NOTE — ED Provider Notes (Signed)
[place] EMERGENCY DEPARTMENT AT [facility] Provider Note   CSN: [id number] Arrival date & time: 06/26/23  4098     History  Chief Complaint  Patient presents with   Hypotension   Weakness   Dizziness    Jordan Kennedy is a 67 y.o. male.   Weakness Dizziness Associated symptoms: weakness      67 year old male with medical history significant for intellectual disability, schizophrenia, bowel obstruction, CHF last EF less than 20% presenting to the emergency department with a chief complaint of low blood pressure and generalized weakness and fatigue.  The patient was seen at urgent care yesterday had low blood pressure there and was told to present to the emergency department if you continue to have low blood pressure and ongoing symptoms.  He denies any chest pain.  Endorses lightheadedness and fatigue.  No syncopal episodes.  No shortness of breath.  No lower extremity swelling, no history of DVT or PE.  Home Medications Prior to Admission medications   Medication Sig Start Date End Date Taking? Authorizing Provider  amantadine (SYMMETREL) 100 MG capsule Take 100 mg by mouth at bedtime.     [provider]  aspirin EC (GNP ASPIRIN) 81 MG tablet Take 1 tablet (81 mg total) by mouth daily. 09/18/22   Loyola Mast, MD  clotrimazole (LOTRIMIN) 1 % cream Apply topically 2 (two) times daily. 03/19/23   Loyola Mast, MD  cloZAPine (CLOZARIL) 100 MG tablet Take 2 tablets (200 mg total) by mouth 2 (two) times daily. 03/12/17   Rodolph Bong, MD  dapagliflozin propanediol (FARXIGA) 10 MG TABS tablet Take 1 tablet (10 mg total) by mouth daily. 01/11/23   Loyola Mast, MD  furosemide (LASIX) 40 MG tablet Take 1 tablet (40 mg total) by mouth as needed. 02/13/23   Maisie Fus, MD  linaclotide Karlene Einstein) 290 MCG CAPS capsule Take 1 capsule (290 mcg total) by mouth daily before breakfast. 06/19/23   Loyola Mast, MD  Melatonin 10 MG SUBL Place 10 mg under the  tongue at bedtime.    [provider]  metoCLOPramide (REGLAN) 5 MG tablet Take 1 tablet (5 mg total) by mouth 2 (two) times daily. 01/11/23   Loyola Mast, MD  metoprolol succinate (TOPROL XL) 25 MG 24 hr tablet Take 1 tablet (25 mg total) by mouth daily. 05/09/23   Tobb, Kardie, DO  MYRBETRIQ 50 MG TB24 tablet Take 50 mg by mouth at bedtime. 01/06/21   [provider]  omeprazole (PRILOSEC) 40 MG capsule Take 1 capsule (40 mg total) by mouth at bedtime. 01/11/23   Loyola Mast, MD  PARoxetine (PAXIL) 40 MG tablet Take 40 mg by mouth every evening.     [provider]  polyethylene glycol powder (GLYCOLAX/MIRALAX) 17 GM/SCOOP powder Take 17 g by mouth See admin instructions. 17 g twice weekly on Monday and Thursday. 01/11/23   Loyola Mast, MD  sacubitril-valsartan (ENTRESTO) 24-26 MG Take 1 tablet by mouth 2 (two) times daily. 01/11/23   Loyola Mast, MD  senna-docusate (SENNA PLUS) 8.6-50 MG tablet Take 1 tablet by mouth 2 (two) times daily. 03/19/23   Loyola Mast, MD  tamsulosin (FLOMAX) 0.4 MG CAPS capsule Take 0.4 mg by mouth at bedtime. 01/10/21   [provider]  zolpidem (AMBIEN) 10 MG tablet Take 1 tablet (10 mg total) by mouth at bedtime. 06/19/23   Loyola Mast, MD      Allergies  Patient has no known allergies.    Review of Systems   Review of Systems  Constitutional:  Positive for fatigue.  Neurological:  Positive for weakness and light-headedness.    Physical Exam Updated Vital Signs BP 103/79 (BP Location: Right Arm)   Pulse 88   Temp 98.2 F (36.8 C) (Oral)   Resp (!) 25   SpO2 97%  Physical Exam Vitals and nursing note reviewed.  Constitutional:      General: He is not in acute distress.    Appearance: He is well-developed.  HENT:     Head: Normocephalic and atraumatic.  Eyes:     Conjunctiva/sclera: Conjunctivae normal.     Pupils: Pupils are equal, round, and reactive to light.  Cardiovascular:     Rate and  Rhythm: Normal rate and regular rhythm.  Pulmonary:     Effort: Pulmonary effort is normal. No respiratory distress.     Breath sounds: Normal breath sounds.  Abdominal:     General: There is no distension.     Palpations: Abdomen is soft.     Tenderness: There is no abdominal tenderness. There is no guarding.  Musculoskeletal:        General: No swelling, deformity or signs of injury.     Cervical back: Neck supple.  Skin:    General: Skin is warm and dry.     Capillary Refill: Capillary refill takes less than 2 seconds.     Findings: No lesion or rash.  Neurological:     General: No focal deficit present.     Mental Status: He is alert. Mental status is at baseline.     Cranial Nerves: No cranial nerve deficit.     Sensory: No sensory deficit.     Motor: No weakness.  Psychiatric:        Mood and Affect: Mood normal.     ED Results / Procedures / Treatments   Labs (all labs ordered are listed, but only abnormal results are displayed) Labs Reviewed  BASIC METABOLIC PANEL - Abnormal; Notable for the following components:      Result Value   Glucose, Bld 112 (*)    Creatinine, Ser 1.47 (*)    GFR, Estimated 53 (*)    All other components within normal limits  CBC - Abnormal; Notable for the following components:   WBC 13.3 (*)    RDW 16.4 (*)    All other components within normal limits  URINALYSIS, ROUTINE W REFLEX MICROSCOPIC - Abnormal; Notable for the following components:   APPearance HAZY (*)    Glucose, UA >=500 (*)    Hgb urine dipstick MODERATE (*)    Ketones, ur 5 (*)    Protein, ur 30 (*)    All other components within normal limits  D-DIMER, QUANTITATIVE - Abnormal; Notable for the following components:   D-Dimer, Quant 0.75 (*)    All other components within normal limits  CBG MONITORING, ED - Abnormal; Notable for the following components:   Glucose-Capillary 106 (*)    All other components within normal limits  RESP PANEL BY RT-PCR (RSV, FLU A&B,  COVID)  RVPGX2  BRAIN NATRIURETIC PEPTIDE  TROPONIN I (HIGH SENSITIVITY)    EKG EKG Interpretation Date/Time:  Wednesday June 26 2023 11:00:13 EDT Ventricular Rate:  93 PR Interval:  168 QRS Duration:  167 QT Interval:  433 QTC Calculation: 539 R Axis:   -20  Text Interpretation: Sinus rhythm Multiple ventricular premature complexes Left bundle branch block Confirmed  by Ernie Avena [phone]) on 06/26/2023 3:09:27 PM  Radiology CT Angio Chest PE W and/or Wo Contrast  Result Date: 06/26/2023 CLINICAL DATA:  Pulmonary embolism (PE) suspected, low to intermediate prob, positive D-dimer Weakness and lightheaded. EXAM: CT ANGIOGRAPHY CHEST WITH CONTRAST TECHNIQUE: Multidetector CT imaging of the chest was performed using the standard protocol during bolus administration of intravenous contrast. Multiplanar CT image reconstructions and MIPs were obtained to evaluate the vascular anatomy. RADIATION DOSE REDUCTION: This exam was performed according to the departmental dose-optimization program which includes automated exposure control, adjustment of the mA and/or kV according to patient size and/or use of iterative reconstruction technique. CONTRAST:  75mL OMNIPAQUE IOHEXOL 350 MG/ML SOLN COMPARISON:  Radiograph earlier today FINDINGS: Cardiovascular: There are no filling defects within the pulmonary arteries to suggest pulmonary embolus. Mild to moderate cardiomegaly with primarily left heart dilatation. No pericardial effusion. The thoracic aorta is normal in caliber. No contrast in the aorta to assess for dissection. Mediastinum/Nodes: 14 mm right hilar node. No mediastinal adenopathy. There is moderate wall thickening of the mid and distal esophagus, for example series 5, image 72. The upper esophagus is patulous. Lungs/Pleura: Mild hypoventilatory changes in the dependent lungs. Trace bilateral pleural effusions. Minimal bronchial thickening. No pulmonary mass or suspicious nodule. Upper Abdomen: No  acute findings. Musculoskeletal: Flowing anterior osteophytes in the mid lower thoracic spine. There are no acute or suspicious osseous abnormalities. Review of the MIP images confirms the above findings. IMPRESSION: 1. No pulmonary embolus. 2. Mild to moderate cardiomegaly with primarily left heart dilatation. 3. Trace bilateral pleural effusions. 4. Moderate wall thickening of the mid and distal esophagus, can be seen with reflux or esophagitis. 5. Prominent 14 mm right hilar node, likely reactive. Electronically Signed   By: Narda Rutherford M.D.   On: 06/26/2023 15:29   DG Chest Portable 1 View  Result Date: 06/26/2023 CLINICAL DATA:  Weakness.  Lightheaded EXAM: PORTABLE CHEST 1 VIEW COMPARISON:  X-ray 01/03/2023 FINDINGS: No consolidation, pneumothorax or effusion. No edema. Normal cardiopericardial silhouette. Overlapping cardiac leads. IMPRESSION: No acute cardiopulmonary disease. Electronically Signed   By: Karen Kays M.D.   On: 06/26/2023 11:57    Procedures Procedures    Medications Ordered in ED Medications  lactated ringers bolus 1,000 mL (0 mLs Intravenous Stopped 06/26/23 1304)  iohexol (OMNIPAQUE) 350 MG/ML injection 75 mL (75 mLs Intravenous Contrast Given 06/26/23 1429)    ED Course/ Medical Decision Making/ A&P                                 Medical Decision Making Amount and/or Complexity of Data Reviewed Labs: ordered. Radiology: ordered.  Risk Prescription drug management.    67 year old male with medical history significant for intellectual disability, schizophrenia, bowel obstruction, CHF last EF less than 20% presenting to the emergency department with a chief complaint of low blood pressure and generalized weakness and fatigue.  The patient was seen at urgent care yesterday had low blood pressure there and was told to present to the emergency department if you continue to have low blood pressure and ongoing symptoms.  He denies any chest pain.  Endorses  lightheadedness and fatigue.  No syncopal episodes.  No shortness of breath.  No lower extremity swelling, no history of DVT or PE.  On arrival, the patient was afebrile, not tachycardic on my evaluation, initial blood pressure hypertensive BP 83/61, improved with a single liter IV fluid bolus,  saturating 100% on room air.  No evidence for hypervolemia with lungs clear to auscultation and no lower extremity edema on exam.  Low concern for CHF exacerbation.  Considered ACS, PE, anemia, hypovolemia from dehydration, electrolyte abnormality.  Initial EKG revealed sinus rhythm, PVCs present, left bundle branch block, no acute ischemic changes.  Laboratory evaluation revealed CBG 106, UA without evidence of UTI, moderate hemoglobin present, COVID-19 influenza PCR testing negative, CBC with a nonspecific leukocytosis of 13.3, hemoglobin 13, BMP without significant electrolyte abnormality, creatinine 1.47, initial troponin 14 and elevated D-dimer of 0.75, BMP collected and pending.  Chest x-ray is performed which revealed no acute cardiac or pulmonary abnormality with no evidence of pulmonary edema  CTA PE: Pending  Plan at time of signout to reassess the patient following CT imaging and fluid resuscitation.  Patient remained hemodynamically stable following initial fluid resuscitation.  Signout given to Dr. Silverio Lay at 1500.   Final Clinical Impression(s) / ED Diagnoses Final diagnoses:  Weakness  Fatigue, unspecified type  Hypotension, unspecified hypotension type    Rx / DC Orders ED Discharge Orders     None         Ernie Avena, MD 06/26/23 [phone]

## 2023-06-26 NOTE — Telephone Encounter (Signed)
Pt c/o BP issue: STAT if pt c/o blurred vision, one-sided weakness or slurred speech  1. What are your last 5 BP readings? 84/64  2. Are you having any other symptoms (ex. Dizziness, headache, blurred vision, passed out)? Dizziness  3. What is your BP issue? Pt was complaining about dizziness and went to urgent care yesterday to be checked. His BP was low then and after checking it again this morning it's still low.

## 2023-06-26 NOTE — ED Triage Notes (Signed)
Caregiver stated, he had low BP as of yesterday, he had an appt with foot Dr. And then he was getting out of car and felt weak and had to sit him down. His BP was low yesterday and they told me some things to do and it was still low so I decided to bring him in for a check up.

## 2023-07-04 NOTE — Progress Notes (Signed)
Cardiology Clinic Note   Date: 07/05/2023 ID: Arbaz Rumore, DOB 1956/10/08, MRN [id number]  Primary Cardiologist:  Thomasene Ripple, DO  Patient Profile    Jordan Kennedy is a 67 y.o. male who presents to the clinic today for hospital follow up.     Past medical history significant for: Chronic systolic heart failure. Echo 05/02/2023: EF <20%.  Global hypokinesis.  Septal-lateral dyssynchrony consistent with LBBB.  Grade I DD.  Moderately reduced RV function.  Mild LAE.  Mild MR. LBBB. Frequent PVCs. Schizophrenia. Tardive dyskinesia. Intellectual disability. Anemia. Tobacco abuse. Quit March 2024.  Ward of the state. [facility] [employer] Social Services legal guardian. Resides in a group home.     History of Present Illness    Jordan Kennedy was first evaluated by Dr. Servando Salina on 01/03/2023 for LV dysfunction during hospital admission.  Patient was admitted on 01/01/2023 for 1.5-week history of nonproductive cough, nasal congestion, and weakness.  He tested negative for COVID prior to presenting to the ED.  He developed worsening shortness of breath for 2 days and presented to the ED where he was febrile at 101.4 with tachycardia, tachypnea, and hypoxia down to 84% requiring NRB.  Chest x-ray was concerning for pulmonary edema or diffuse infection.  He was admitted for management of community-acquired pneumonia as well as suspected congestive heart failure with BNP 985.  Echo showed EF 25 to 30%, WMA (anterior septum, mid inferior septal segment, and basal inferior septal segment akinesis and apical septal segment hypokinesis), mild asymmetric LVH of the inferior lateral segment, mildly reduced RV SF, moderate LAE, moderate to severe MR with splay artifact suggesting this may be underestimated.  A long QT interval while tachycardic.  EKG showed sinus tachycardia, NSIVCD with LBBB new from 2019, occasional PVCs.  Troponin was elevated but flat most likely from demand ischemia.  He was  diuresed with IV Lasix.  History was difficult to obtain secondary to patient's intellectual disability.  Decision made to treat patient medically secondary to concerns for his ability to follow directions for ischemic evaluation.  Patient was last seen in the office by Dr. Servando Salina on 05/09/2023 to review repeat echo.  He was clinically stable on GDMT and euvolemic at the time of his visit.  His echo showed worsening LV dysfunction and LBBB with dyssynchrony (details above).  He was referred to EP and advanced heart failure clinic for further evaluation.  Lopressor changed to Toprol.  He was evaluated by Dr. Elberta Fortis on 05/27/2023.  It was decided he would not benefit from invasive procedures.  Patient stated he does not want pacemaker or defibrillator implantation.  At urgent care on 06/25/2023 with complaints of dizziness and found to have low BP.  Presented to the ED on 06/26/2023 with continued hypotension, weakness, dizziness.  EKG showed sinus rhythm with LBBB and frequent PVCs.  D-dimer was elevated.  CTA negative for PE.  Patient contacted the office for recommendations prior to presenting to the ED.  Dr. Servando Salina recommended stopping spironolactone.  Today, patient is accompanied by his caregiver, Fayrene Fearing. Patient reports he still feels a little weak but he is no longer dizzy. BP at home has been in the 110-115/80 range. Daily weight has been stable. Patient denies shortness of breath or dyspnea on exertion. No chest pain, pressure, or tightness. Denies lower extremity edema, orthopnea, or PND. No palpitations.     ROS: All other systems reviewed and are otherwise negative except as noted in History of Present Illness.  Studies Reviewed  EKG is not ordered today.          Physical Exam    VS:  BP (!) 90/56   Pulse (!) 52   Ht 6\' 2"  (1.88 m)   Wt 181 lb (82.1 kg)   SpO2 97%   BMI 23.24 kg/m  , BMI Body mass index is 23.24 kg/m.  GEN: Well nourished, well developed, in no acute distress. Neck:  No JVD or carotid bruits. Cardiac:  RRR. No murmurs. No rubs or gallops.   Respiratory:  Respirations regular and unlabored. Clear to auscultation without rales, wheezing or rhonchi. GI: Soft, nontender, nondistended. Extremities: Radials/DP/PT 2+ and equal bilaterally. No clubbing or cyanosis. No edema.  Skin: Warm and dry, no rash. Neuro: Strength intact.  Assessment & Plan    Chronic systolic heart failure.  Echo July 2024 showed EF <20%, global hypokinesis, septal/lateral dyssynchrony consistent with LBBB, Grade I DD, moderately reduced RV function, mild LAE, mild MR.  Patient evaluated by Dr. Elberta Fortis on 05/27/2023 with decision to defer invasive procedures and patient verbalizing that he did not want pacemaker or defibrillator implantation.  Patient denies shortness of breath, DOE, lower extremity edema, orthopnea or PND.  Euvolemic and well compensated on exam. Continue Entresto, Farxiga, Lasix as needed. Decrease Toprol to 12.5 mg (see #2). Spironolactone was stopped secondary to hypotension.  Hypotension. BP today 90/56 on intake and 94/58 on my recheck. Patient reports mild weakness but no longer dizzy. He reports good hydration. BP at home 110-115/80s. Continue Entresto. Remain off spironolactone. Decrease Toprol to 12.5 mg daily.   Tobacco abuse. Patient reports complete cessation March 2024. He is congratulated and encouraged to continue cessation.   Disposition: Decrease Toprol to 12.5 mg daily. Return in 2 months or sooner as needed.          Signed, Etta Grandchild. Kasey Ewings, DNP, NP-C

## 2023-07-05 ENCOUNTER — Ambulatory Visit: Payer: Medicare Other | Attending: Student | Admitting: Student

## 2023-07-05 ENCOUNTER — Encounter: Payer: Self-pay | Admitting: Student

## 2023-07-05 VITALS — BP 90/56 | HR 52 | Ht 74.0 in | Wt 181.0 lb

## 2023-07-05 DIAGNOSIS — F17201 Nicotine dependence, unspecified, in remission: Secondary | ICD-10-CM

## 2023-07-05 DIAGNOSIS — I959 Hypotension, unspecified: Secondary | ICD-10-CM | POA: Diagnosis present

## 2023-07-05 DIAGNOSIS — I5022 Chronic systolic (congestive) heart failure: Secondary | ICD-10-CM

## 2023-07-05 MED ORDER — METOPROLOL SUCCINATE ER 25 MG PO TB24: 12.5000 mg | ORAL_TABLET | Freq: Every day | ORAL | 1 refills | Status: DC

## 2023-07-05 NOTE — Patient Instructions (Addendum)
Medication Instructions:  DECREASE THE TOPROL TO 12.5MG  DAILY. TAKE ONLY A HALF TABLET *If you need a refill on your cardiac medications before your next appointment, please call your pharmacy*   Lab Work: NONE ORDERED  If you have labs (blood work) drawn today and your tests are completely normal, you will receive your results only by: MyChart Message (if you have MyChart) OR A paper copy in the mail If you have any lab test that is abnormal or we need to change your treatment, we will call you to review the results.   Testing/Procedures: NONE    Follow-Up: At [facility], you and your health needs are our priority.  As part of our continuing mission to provide you with exceptional heart care, we have created designated Provider Care Teams.  These Care Teams include your primary Cardiologist (physician) and Advanced Practice Providers (APPs -  Physician Assistants and Nurse Practitioners) who all work together to provide you with the care you need, when you need it.  We recommend signing up for the patient portal called "MyChart".  Sign up information is provided on this After Visit Summary.  MyChart is used to connect with patients for Virtual Visits (Telemedicine).  Patients are able to view lab/test results, encounter notes, upcoming appointments, etc.  Non-urgent messages can be sent to your provider as well.   To learn more about what you can do with MyChart, go to [email or web address].    Your next appointment:   2 month(s)  Provider:   Thomasene Ripple, DO

## 2023-07-05 NOTE — Addendum Note (Signed)
Addended by: Waymond Cera on: 07/05/2023 01:01 PM   Modules accepted: Orders

## 2023-07-18 ENCOUNTER — Telehealth: Payer: Self-pay

## 2023-07-18 NOTE — Telephone Encounter (Signed)
Transition Care Management Follow-up Telephone Call Date of discharge and from where: 06/26/2023 [facility] How have you been since you were released from the hospital? Spoke with patient's caregiver Jenel Lucks patient is feeling much better and his BP is back to normal. Any questions or concerns? No  Items Reviewed: Did the pt receive and understand the discharge instructions provided? Yes  Medications obtained and verified?  No medication prescribed. Other? No  Any new allergies since your discharge? No  Dietary orders reviewed? Yes Do you have support at home? Yes   Follow up appointments reviewed:  PCP Hospital f/u appt confirmed? No  Scheduled to see  on  @ . Specialist Hospital f/u appt confirmed? Yes  Scheduled to see Carlos Levering, NP on 07/05/2023 @ [facility]. Are transportation arrangements needed? No  If their condition worsens, is the pt aware to call PCP or go to the Emergency Dept.? Yes Was the patient provided with contact information for the PCP's office or ED? Yes Was to pt encouraged to call back with questions or concerns? Yes  Mohsin Crum [facility] Guide Direct Dial: [phone]  Website: [email or web address]

## 2023-09-11 ENCOUNTER — Encounter: Payer: Self-pay | Admitting: Cardiology

## 2023-09-11 ENCOUNTER — Telehealth: Payer: Self-pay | Admitting: Cardiology

## 2023-09-11 ENCOUNTER — Ambulatory Visit: Payer: Medicare Other | Attending: Cardiology | Admitting: Cardiology

## 2023-09-11 VITALS — BP 100/64 | HR 97 | Ht 74.0 in | Wt 181.6 lb

## 2023-09-11 DIAGNOSIS — I447 Left bundle-branch block, unspecified: Secondary | ICD-10-CM | POA: Insufficient documentation

## 2023-09-11 DIAGNOSIS — I959 Hypotension, unspecified: Secondary | ICD-10-CM | POA: Diagnosis present

## 2023-09-11 DIAGNOSIS — I493 Ventricular premature depolarization: Secondary | ICD-10-CM | POA: Diagnosis present

## 2023-09-11 DIAGNOSIS — I5022 Chronic systolic (congestive) heart failure: Secondary | ICD-10-CM | POA: Insufficient documentation

## 2023-09-11 NOTE — Patient Instructions (Signed)
Medication Instructions:  Your physician has recommended you make the following change in your medication:  START: Midodrine 5 mg twice daily *If you need a refill on your cardiac medications before your next appointment, please call your pharmacy*   Follow-Up: At [facility], you and your health needs are our priority.  As part of our continuing mission to provide you with exceptional heart care, we have created designated Provider Care Teams.  These Care Teams include your primary Cardiologist (physician) and Advanced Practice Providers (APPs -  Physician Assistants and Nurse Practitioners) who all work together to provide you with the care you need, when you need it.  We recommend signing up for the patient portal called "MyChart".  Sign up information is provided on this After Visit Summary.  MyChart is used to connect with patients for Virtual Visits (Telemedicine).  Patients are able to view lab/test results, encounter notes, upcoming appointments, etc.  Non-urgent messages can be sent to your provider as well.   To learn more about what you can do with MyChart, go to [email or web address].    Your next appointment:   12 week(s)  Provider:   Thomasene Ripple, DO

## 2023-09-11 NOTE — Telephone Encounter (Signed)
Patient identification verified by 2 forms. Marilynn Rail, RN    Called and spoke to patients care Taker Chalmers Guest states:   -Pharmacy did not receive prescription for Midodrine   -Patient take Myrbetriq 50mg  daily and clozapine 200mg  BID   -would like rx sent to [facility]  Informed Fayrene Fearing message sent to Dr. Servando Salina for assistance

## 2023-09-11 NOTE — Telephone Encounter (Signed)
Pt c/o medication issue:  1. Name of Medication:   Midodrine, 5 mg  2. How are you currently taking this medication (dosage and times per day)?   Not started yet  3. Are you having a reaction (difficulty breathing--STAT)?   4. What is your medication issue?   Caller Jordan Kennedy) stated he went to the pharmacy and the prescription was not yet available for pick up at [facility] - [place], [place] - [address] and want prescript sent.

## 2023-09-12 MED ORDER — MIDODRINE HCL 5 MG PO TABS: 5.0000 mg | ORAL_TABLET | Freq: Two times a day (BID) | ORAL | 2 refills | Status: DC

## 2023-09-12 NOTE — Telephone Encounter (Signed)
Pt following up on phone note from yesterday 11/20. The [facility] on [address]. Please advise

## 2023-09-12 NOTE — Telephone Encounter (Signed)
Medication Management (Newest Message First) View All Conversations on this Encounter Tobb, Kardie, DO  You; Newby, Jasmine M, RN; [facility]23 minutes ago (11:07 AM)    Please send the patient medication midodrine 5 mg BID   Patient identification verified by 2 forms. Marilynn Rail, RN    Called and spoke to patients care taker Fayrene Fearing  Informed Fayrene Fearing Rx sent to preferred pharmacy  Fayrene Fearing has no further questions at this time

## 2023-09-13 NOTE — Progress Notes (Unsigned)
Cardiology Office Note:    Date:  09/14/2023   ID:  Jordan Kennedy, DOB October 17, 1956, MRN [id number]  PCP:  Loyola Mast, MD  Cardiologist:  Thomasene Ripple, DO  Electrophysiologist:  None   Referring MD: Loyola Mast, MD   " I am ok"  History of Present Illness:    Jordan Kennedy is a 67 y.o. male with a hx of cognitive Paramed, tardive dyskinesia and depressed ejection fraction.  His last visit with me which was May 09, 2023 as started to optimize his guideline directed medical therapy, unfortunately in the meantime he reported hypotension.  He was seen in the office and at that time taken off blood pressure lowering medications.  Since that visit he has seen EP for evaluation for device and discussions led to waiting at this time instead of proceeding with the device due to same reasons why the patient had not underwent cardiac catheterization due to inability to be able to follow directions during her procedure.  The patient's caregiver reports that the patient attends a day program, which may affect the timing of medication administration. The patient also has a history of enjoying ice cream, with the most recent consumption being a week ago on his birthday  Past Medical History:  Diagnosis Date   AKI (acute kidney injury) (HCC) 06/29/2018   Anemia    Anemia, iron deficiency 09/18/2015   Bowel obstruction (HCC)    BPH (benign prostatic hyperplasia)    Chronic constipation    Chronic systolic heart failure (HCC)    History of ileus    Ileus (HCC) 03/07/2017   Mental retardation    Partial bowel obstruction (HCC)    Schizophrenia (HCC)    Monarche every three months;    SIRS (systemic inflammatory response syndrome) (HCC) 06/29/2018   Small bowel obstruction (HCC) 06/29/2018    No past surgical history on file.  Current Medications: Current Meds  Medication Sig   amantadine (SYMMETREL) 100 MG capsule Take 100 mg by mouth at bedtime.    aspirin EC (GNP ASPIRIN) 81  MG tablet Take 1 tablet (81 mg total) by mouth daily.   clotrimazole (LOTRIMIN) 1 % cream Apply topically 2 (two) times daily.   cloZAPine (CLOZARIL) 100 MG tablet Take 2 tablets (200 mg total) by mouth 2 (two) times daily.   dapagliflozin propanediol (FARXIGA) 10 MG TABS tablet Take 1 tablet (10 mg total) by mouth daily.   furosemide (LASIX) 40 MG tablet Take 1 tablet (40 mg total) by mouth as needed.   linaclotide (LINZESS) 290 MCG CAPS capsule Take 1 capsule (290 mcg total) by mouth daily before breakfast.   Melatonin 10 MG SUBL Place 10 mg under the tongue at bedtime.   metoCLOPramide (REGLAN) 5 MG tablet Take 1 tablet (5 mg total) by mouth 2 (two) times daily.   metoprolol succinate (TOPROL XL) 25 MG 24 hr tablet Take 0.5 tablets (12.5 mg total) by mouth daily.   MYRBETRIQ 50 MG TB24 tablet Take 50 mg by mouth daily.   omeprazole (PRILOSEC) 40 MG capsule Take 1 capsule (40 mg total) by mouth at bedtime.   PARoxetine (PAXIL) 40 MG tablet Take 40 mg by mouth every evening.    polyethylene glycol powder (GLYCOLAX/MIRALAX) 17 GM/SCOOP powder Take 17 g by mouth See admin instructions. 17 g twice weekly on Monday and Thursday.   sacubitril-valsartan (ENTRESTO) 24-26 MG Take 1 tablet by mouth 2 (two) times daily.   senna-docusate (SENNA PLUS) 8.6-50 MG tablet Take 1  tablet by mouth 2 (two) times daily.   tamsulosin (FLOMAX) 0.4 MG CAPS capsule Take 0.4 mg by mouth daily.   zolpidem (AMBIEN) 10 MG tablet Take 1 tablet (10 mg total) by mouth at bedtime.     Allergies:   Patient has no known allergies.   Social History   Socioeconomic History   Marital status: Single    Spouse name: Not on file   Number of children: Not on file   Years of education: Not on file   Highest education level: Not on file  Occupational History   Not on file  Tobacco Use   Smoking status: Every Day    Current packs/day: 0.25    Average packs/day: 0.3 packs/day for 30.0 years (7.5 ttl pk-yrs)    Types:  Cigarettes   Smokeless tobacco: Never  Vaping Use   Vaping status: Never Used  Substance and Sexual Activity   Alcohol use: No   Drug use: No   Sexual activity: Never  Other Topics Concern   Not on file  Social History Narrative   Marital status: single      Children: none       Employment: disability due to mental retardation and schizophrenia.      Lives: group home [name]; 3 men in group home.        Tobacco: six cigarettes per day; since age 47.      Alcohol:  None      Drugs: none      Exercise:  Walking some.      Seatbelt:  100%   Social Determinants of Health   Financial Resource Strain: Low Risk  (10/16/2022)   Overall Financial Resource Strain (CARDIA)    Difficulty of Paying Living Expenses: Not hard at all  Food Insecurity: No Food Insecurity (10/16/2022)   Hunger Vital Sign    Worried About Running Out of Food in the Last Year: Never true    Ran Out of Food in the Last Year: Never true  Transportation Needs: No Transportation Needs (10/16/2022)   PRAPARE - Administrator, Civil Service (Medical): No    Lack of Transportation (Non-Medical): No  Physical Activity: Inactive (10/16/2022)   Exercise Vital Sign    Days of Exercise per Week: 0 days    Minutes of Exercise per Session: 0 min  Stress: No Stress Concern Present (10/16/2022)   [employer] of Occupational Health - Occupational Stress Questionnaire    Feeling of Stress : Not at all  Social Connections: Socially Isolated (10/11/2021)   Social Connection and Isolation Panel [NHANES]    Frequency of Communication with Friends and Family: Twice a week    Frequency of Social Gatherings with Friends and Family: Twice a week    Attends Religious Services: Never    Database administrator or Organizations: No    Attends Engineer, structural: Never    Marital Status: Never married     Family History: The patient's family history includes  Cancer in his mother.  ROS:   Review of Systems  Constitution: Negative for decreased appetite, fever and weight gain.  HENT: Negative for congestion, ear discharge, hoarse voice and sore throat.   Eyes: Negative for discharge, redness, vision loss in right eye and visual halos.  Cardiovascular: Negative for chest pain, dyspnea on exertion, leg swelling, orthopnea and palpitations.  Respiratory: Negative for cough, hemoptysis, shortness of breath and snoring.   Endocrine: Negative  for heat intolerance and polyphagia.  Hematologic/Lymphatic: Negative for bleeding problem. Does not bruise/bleed easily.  Skin: Negative for flushing, nail changes, rash and suspicious lesions.  Musculoskeletal: Negative for arthritis, joint pain, muscle cramps, myalgias, neck pain and stiffness.  Gastrointestinal: Negative for abdominal pain, bowel incontinence, diarrhea and excessive appetite.  Genitourinary: Negative for decreased libido, genital sores and incomplete emptying.  Neurological: Negative for brief paralysis, focal weakness, headaches and loss of balance.  Psychiatric/Behavioral: Negative for altered mental status, depression and suicidal ideas.  Allergic/Immunologic: Negative for HIV exposure and persistent infections.    EKGs/Labs/Other Studies Reviewed:    The following studies were reviewed today:   EKG:  The ekg ordered today demonstrates   Recent Labs: 01/04/2023: TSH 0.850 02/13/2023: Pro B Natriuretic peptide (BNP) 933.0 06/25/2023: ALT 23; Magnesium 2.1 06/26/2023: B Natriuretic Peptide 290.0; BUN 18; Creatinine, Ser 1.47; Hemoglobin 13.0; Platelets 231; Potassium 3.8; Sodium 138  Recent Lipid Panel    Component Value Date/Time   CHOL 144 01/31/2021 1000   TRIG 143.0 01/31/2021 1000   HDL 41.40 01/31/2021 1000   CHOLHDL 3 01/31/2021 1000   VLDL 28.6 01/31/2021 1000   LDLCALC 74 01/31/2021 1000    Physical Exam:    VS:  BP 100/64 (BP Location: Left Arm, Patient Position:  Sitting, Cuff Size: Normal)   Pulse 97   Ht 6\' 2"  (1.88 m)   Wt 181 lb 9.6 oz (82.4 kg)   SpO2 99%   BMI 23.32 kg/m     Wt Readings from Last 3 Encounters:  09/11/23 181 lb 9.6 oz (82.4 kg)  07/05/23 181 lb (82.1 kg)  06/19/23 180 lb 6.4 oz (81.8 kg)     GEN: Well nourished, well developed in no acute distress HEENT: Normal NECK: No JVD; No carotid bruits LYMPHATICS: No lymphadenopathy CARDIAC: S1S2 noted,RRR, no murmurs, rubs, gallops RESPIRATORY:  Clear to auscultation without rales, wheezing or rhonchi  ABDOMEN: Soft, non-tender, non-distended, +bowel sounds, no guarding. EXTREMITIES: No edema, No cyanosis, no clubbing MUSCULOSKELETAL:  No deformity  SKIN: Warm and dry NEUROLOGIC:  Alert and oriented x 3, non-focal PSYCHIATRIC:  Normal affect, good insight  ASSESSMENT:    1. Chronic systolic heart failure (HCC)   2. Left bundle branch block   3. Frequent PVCs   4. Hypotension, unspecified hypotension type    PLAN:     Hypotension Blood pressure on the lower side, possibly due to discontinuation of spironolactone and reduction of Lopressor. Discussed the use of Midodrine to support blood pressure. -Start Midodrine twice daily. -Check blood pressure in 12 weeks.  Heart Function Concerns about heart function and potential use of a pacemaker. Plan to reevaluate once blood pressure is stabilized. -Once blood pressure is stabilized with Midodrine, consider reintroducing spironolactone and Lopressor to support heart function. -Continue monitoring heart function and reevaluate potential need for pacemaker.  Follow-up in 12 weeks to assess blood pressure and heart function.  The patient is in agreement with the above plan. The patient left the office in stable condition.  The patient will follow up in   Medication Adjustments/Labs and Tests Ordered: Current medicines are reviewed at length with the patient today.  Concerns regarding medicines are outlined above.  No  orders of the defined types were placed in this encounter.  No orders of the defined types were placed in this encounter.   Patient Instructions  Medication Instructions:  Your physician has recommended you make the following change in your medication:  START: Midodrine 5 mg twice  daily *If you need a refill on your cardiac medications before your next appointment, please call your pharmacy*   Follow-Up: At [facility], you and your health needs are our priority.  As part of our continuing mission to provide you with exceptional heart care, we have created designated Provider Care Teams.  These Care Teams include your primary Cardiologist (physician) and Advanced Practice Providers (APPs -  Physician Assistants and Nurse Practitioners) who all work together to provide you with the care you need, when you need it.  We recommend signing up for the patient portal called "MyChart".  Sign up information is provided on this After Visit Summary.  MyChart is used to connect with patients for Virtual Visits (Telemedicine).  Patients are able to view lab/test results, encounter notes, upcoming appointments, etc.  Non-urgent messages can be sent to your provider as well.   To learn more about what you can do with MyChart, go to [email or web address].    Your next appointment:   12 week(s)  Provider:   Thomasene Ripple, DO        Adopting a Healthy Lifestyle.  Know what a healthy weight is for you (roughly BMI <25) and aim to maintain this   Aim for 7+ servings of fruits and vegetables daily   65-80+ fluid ounces of water or unsweet tea for healthy kidneys   Limit to max 1 drink of alcohol per day; avoid smoking/tobacco   Limit animal fats in diet for cholesterol and heart health - choose grass fed whenever available   Avoid highly processed foods, and foods high in saturated/trans fats   Aim for low stress - take time to unwind and care for your mental health   Aim for 150 min of  moderate intensity exercise weekly for heart health, and weights twice weekly for bone health   Aim for 7-9 hours of sleep daily   When it comes to diets, agreement about the perfect plan isnt easy to find, even among the experts. Experts at the [facility] of [employer] developed an idea known as the Healthy Eating Plate. Just imagine a plate divided into logical, healthy portions.   The emphasis is on diet quality:   Load up on vegetables and fruits - one-half of your plate: Aim for color and variety, and remember that potatoes dont count.   Go for whole grains - one-quarter of your plate: Whole wheat, barley, wheat berries, quinoa, oats, brown rice, and foods made with them. If you want pasta, go with whole wheat pasta.   Protein power - one-quarter of your plate: Fish, chicken, beans, and nuts are all healthy, versatile protein sources. Limit red meat.   The diet, however, does go beyond the plate, offering a few other suggestions.   Use healthy plant oils, such as olive, canola, soy, corn, sunflower and peanut. Check the labels, and avoid partially hydrogenated oil, which have unhealthy trans fats.   If youre thirsty, drink water. Coffee and tea are good in moderation, but skip sugary drinks and limit milk and dairy products to one or two daily servings.   The type of carbohydrate in the diet is more important than the amount. Some sources of carbohydrates, such as vegetables, fruits, whole grains, and beans-are healthier than others.   Finally, stay active  Signed, Thomasene Ripple, DO  09/14/2023 1:27 PM    [facility]

## 2023-09-23 ENCOUNTER — Encounter: Payer: Self-pay | Admitting: Family Medicine

## 2023-09-23 ENCOUNTER — Ambulatory Visit: Payer: Medicare Other | Admitting: Family Medicine

## 2023-09-23 VITALS — BP 114/68 | HR 96 | Temp 98.8°F | Ht 74.0 in | Wt 181.6 lb

## 2023-09-23 DIAGNOSIS — Z23 Encounter for immunization: Secondary | ICD-10-CM | POA: Diagnosis not present

## 2023-09-23 DIAGNOSIS — F79 Unspecified intellectual disabilities: Secondary | ICD-10-CM | POA: Diagnosis not present

## 2023-09-23 DIAGNOSIS — I5022 Chronic systolic (congestive) heart failure: Secondary | ICD-10-CM

## 2023-09-23 DIAGNOSIS — F209 Schizophrenia, unspecified: Secondary | ICD-10-CM | POA: Diagnosis not present

## 2023-09-23 DIAGNOSIS — B353 Tinea pedis: Secondary | ICD-10-CM

## 2023-09-23 MED ORDER — CLOTRIMAZOLE 1 % EX CREA
TOPICAL_CREAM | Freq: Two times a day (BID) | CUTANEOUS | 5 refills | Status: DC
Start: 2023-09-23 — End: 2024-05-15

## 2023-09-23 NOTE — Assessment & Plan Note (Signed)
Creates some challenges regarding his adherence to medical advice, but overall Jordan Kennedy is doing well. 

## 2023-09-23 NOTE — Assessment & Plan Note (Signed)
I will renew antifungal for as needed use.

## 2023-09-23 NOTE — Progress Notes (Signed)
[facility] [address] [place] [address] Dept: [phone] Dept Fax: [phone]  Chronic Care Office Visit  Subjective:    Patient ID: Jordan Kennedy, male    DOB: 11/28/1955, 67 y.o..   MRN: [id number]  Chief Complaint  Patient presents with   Hypertension    3 month f/u.     History of Present Illness:  Patient is in today for reassessment of chronic medical issues. Mr. Cirrito presents with his case worker, Sarajane Marek. He currently lives in a group home.    Mr. Starcher has a history of schizophrenia, anxiety with depression, and intellectual disability. He is currently followed by Dr. Jannifer Franklin (psychiatry). He is managed on clozapine, paroxetine, and amantadine.     Mr. Brodzik has a history of chronic HFrEF. He is managed on dapagliflozin 10 mg daily, furosemide 40 mg daily as needed, Entresto 24-26 mg bid, metoprolol tartrate 12.5 mg bid. He has not been seeing any swelling to his lower legs and has not complained of shortness of breath. He was having an issue with hypotension. Dr. Edmonia James stopped his spironolactone and has started him on midodrine 5 mg bid.  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   Tinea pedis of both feet 01/11/2023   Chronic systolic heart failure (HCC) 01/03/2023   Prolonged QT interval 01/01/2023   Transaminitis 01/01/2023   Thrombocytosis 01/01/2023   Tardive dyskinesia 11/20/2021   Lower urinary tract symptoms (LUTS) 05/02/2021   History of small bowel obstruction 01/31/2021   History of colon polyps 01/31/2021   Onychomycosis 01/31/2021   Gastroesophageal reflux disease 01/31/2021   Tobacco use disorder, mild, in sustained remission 02/01/2015   Constipation due to slow transit 04/25/2012   Anxiety and depression  04/25/2012   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 No pertinent surgical history. Family History  Problem Relation Age of Onset   Cancer Mother        unknown type   Outpatient Medications Prior to Visit  Medication Sig Dispense Refill   amantadine (SYMMETREL) 100 MG capsule Take 100 mg by mouth at bedtime.      aspirin EC (GNP ASPIRIN) 81 MG tablet Take 1 tablet (81 mg total) by mouth daily. 90 tablet 3   cloZAPine (CLOZARIL) 100 MG tablet Take 2 tablets (200 mg total) by mouth 2 (two) times daily. 120 tablet 0   dapagliflozin propanediol (FARXIGA) 10 MG TABS tablet Take 1 tablet (10 mg total) by mouth daily. 30 tablet 11   furosemide (LASIX) 40 MG tablet Take 1 tablet (40 mg total) by mouth as needed. 45 tablet 3   linaclotide (LINZESS) 290 MCG CAPS capsule Take 1 capsule (290 mcg total) by mouth daily before breakfast. 30 capsule 5   Melatonin 10 MG SUBL Place 10 mg under the tongue at bedtime.     metoCLOPramide (REGLAN) 5 MG tablet Take 1 tablet (5 mg total) by mouth 2 (two) times daily. 60 tablet 11   metoprolol succinate (TOPROL XL) 25 MG 24 hr tablet Take 0.5 tablets (12.5 mg total) by mouth daily. 90 tablet 1   midodrine (PROAMATINE) 5 MG tablet Take 1 tablet (5 mg total) by mouth 2 (two) times daily with a meal. 60 tablet 2  MYRBETRIQ 50 MG TB24 tablet Take 50 mg by mouth daily.     omeprazole (PRILOSEC) 40 MG capsule Take 1 capsule (40 mg total) by mouth at bedtime. 30 capsule 11   PARoxetine (PAXIL) 40 MG tablet Take 40 mg by mouth every evening.      polyethylene glycol powder (GLYCOLAX/MIRALAX) 17 GM/SCOOP powder Take 17 g by mouth See admin instructions. 17 g twice weekly on Monday and Thursday. 510 g 11   sacubitril-valsartan (ENTRESTO) 24-26 MG Take 1 tablet by mouth 2 (two) times daily. 60 tablet 11   senna-docusate (SENNA PLUS) 8.6-50 MG tablet Take 1 tablet by mouth 2 (two) times daily. 180 tablet 3   tamsulosin  (FLOMAX) 0.4 MG CAPS capsule Take 0.4 mg by mouth daily.     zolpidem (AMBIEN) 10 MG tablet Take 1 tablet (10 mg total) by mouth at bedtime. 30 tablet 5   clotrimazole (LOTRIMIN) 1 % cream Apply topically 2 (two) times daily. 30 g 5   spironolactone (ALDACTONE) 25 MG tablet Take 12.5 mg by mouth daily. (Patient not taking: Reported on 09/11/2023)     No facility-administered medications prior to visit.   No Known Allergies Objective:   Today's Vitals   09/23/23 0905  BP: 114/68  Pulse: 96  Temp: 98.8 F (37.1 C)  TempSrc: Temporal  SpO2: 98%  Weight: 181 lb 9.6 oz (82.4 kg)  Height: 6\' 2"  (1.88 m)   Body mass index is 23.32 kg/m.   General: Well developed, well nourished. No acute distress. CV: RRR without murmurs or rubs, but with an S3 gallop. Pulses 2+ bilaterally. Extremities:  No edema noted. Psych: Alert and oriented. Normal mood and affect.  Health Maintenance Due  Topic Date Due   Zoster Vaccines- Shingrix (1 of 2) Never done   Pneumonia Vaccine 10+ Years old (2 of 2 - PCV) 04/26/2013   Medicare Annual Wellness (AWV)  10/17/2023     Assessment & Plan:   Problem List Items Addressed This Visit       Cardiovascular and Mediastinum   Chronic systolic heart failure (HCC) - Primary    Compensated. Weight is stable. Continue dapagliflozin 10 mg daily, furosemide 40 mg daily as needed, Entresto 24-26 mg bid, and metoprolol tartrate 12.5 mg bid. Continue midodrine 5 mg bid for hypotension.        Musculoskeletal and Integument   Tinea pedis of both feet    I will renew antifungal for as needed use.      Relevant Medications   clotrimazole (LOTRIMIN) 1 % cream     Other   Intellectual disability (Chronic)    Creates some challenges regarding his adherence to medical advice, but overall Mr. Ell is doing well.      Schizophrenia (HCC) (Chronic)    Stable. Continue to follow with by Dr. Jannifer Franklin (psychiatry) regarding current psych meds.      Other Visit  Diagnoses     Need for pneumococcal 20-valent conjugate vaccination       Relevant Orders   Pneumococcal conjugate vaccine 20-valent (Completed)       Return in about 3 months (around 12/22/2023) for Reassessment.   Loyola Mast, MD

## 2023-09-23 NOTE — Assessment & Plan Note (Signed)
Stable. Continue to follow with by Dr. Jannifer Franklin (psychiatry) regarding current psych meds.

## 2023-09-23 NOTE — Assessment & Plan Note (Signed)
Compensated. Weight is stable. Continue dapagliflozin 10 mg daily, furosemide 40 mg daily as needed, Entresto 24-26 mg bid, and metoprolol tartrate 12.5 mg bid. Continue midodrine 5 mg bid for hypotension.

## 2023-09-27 ENCOUNTER — Telehealth: Payer: Self-pay | Admitting: Family Medicine

## 2023-09-27 DIAGNOSIS — Z0279 Encounter for issue of other medical certificate: Secondary | ICD-10-CM

## 2023-09-27 NOTE — Telephone Encounter (Signed)
Patient dropped off document FL2, to be filled out by provider. Patient requested to send it back via Call Patient to pick up within 7-days. Document is located in providers tray at front office.Please advise at Mobile [phone] (mobile) Pt worker dropped off paperwork to be filled out.pt in dr box

## 2023-09-27 NOTE — Telephone Encounter (Signed)
Unable to leave VM to advise that form is ready due to mailbox is full. Will try later. Dm/cma

## 2023-09-27 NOTE — Telephone Encounter (Signed)
Form placed on providers desk.  Will call patients care giver once done. Dm/cma

## 2023-09-30 NOTE — Telephone Encounter (Signed)
Spoke to patients caretaker, Fayrene Fearing ad advised VIA phone.  Form placed up front for pickup. Dm/cma

## 2023-11-05 ENCOUNTER — Other Ambulatory Visit: Payer: Self-pay | Admitting: Family Medicine

## 2023-11-05 ENCOUNTER — Ambulatory Visit (INDEPENDENT_AMBULATORY_CARE_PROVIDER_SITE_OTHER): Payer: Medicare Other

## 2023-11-05 ENCOUNTER — Encounter: Payer: Self-pay | Admitting: Cardiology

## 2023-11-05 DIAGNOSIS — Z860100 Personal history of colon polyps, unspecified: Secondary | ICD-10-CM

## 2023-11-05 DIAGNOSIS — Z0000 Encounter for general adult medical examination without abnormal findings: Secondary | ICD-10-CM | POA: Diagnosis not present

## 2023-11-05 NOTE — Patient Instructions (Signed)
 Mr. Lortie , Thank you for taking time to come for your Medicare Wellness Visit. I appreciate your ongoing commitment to your health goals. Please review the following plan we discussed and let me know if I can assist you in the future.   Referrals/Orders/Follow-Ups/Clinician Recommendations: none  This is a list of the screening recommended for you and due dates:  Health Maintenance  Topic Date Due   COVID-19 Vaccine (4 - 2024-25 season) 11/21/2023*   Zoster (Shingles) Vaccine (1 of 2) 02/03/2024*   Medicare Annual Wellness Visit  11/04/2024   Colon Cancer Screening  12/01/2025   DTaP/Tdap/Td vaccine (3 - Td or Tdap) 05/03/2031   Pneumonia Vaccine  Completed   Flu Shot  Completed   Hepatitis C Screening  Completed   HIV Screening  Completed   HPV Vaccine  Aged Out  *Topic was postponed. The date shown is not the original due date.    Advanced directives: (Copy Requested) Please bring a copy of your health care power of attorney and living will to the office to be added to your chart at your convenience.  Next Medicare Annual Wellness Visit scheduled for next year: Yes  insert Preventive Care attachment Insert FALL PREVENTION attachment if needed

## 2023-11-05 NOTE — Progress Notes (Signed)
 Subjective:   Jordan Kennedy is a 68 y.o. male who presents for Medicare Annual/Subsequent preventive examination.  Visit Complete: Virtual I connected with  Jordan Kennedy on 11/05/23 by a audio enabled telemedicine application and verified that I am speaking with the correct person using two identifiers. Caregiver [name] also on call.  Interactive audio and video telecommunications were attempted between this provider and patient, however failed, due to patient having technical difficulties OR patient did not have access to video capability.  We continued and completed visit with audio only.  Patient Location: Home  Provider Location: Office/Clinic  I discussed the limitations of evaluation and management by telemedicine. The patient expressed understanding and agreed to proceed.  Vital Signs: Because this visit was a virtual/telehealth visit, some criteria may be missing or patient reported. Any vitals not documented were not able to be obtained and vitals that have been documented are patient reported.    Cardiac Risk Factors include: advanced age (>84men, >36 women)     Objective:    Today's Vitals   There is no height or weight on file to calculate BMI.     11/05/2023    2:56 PM 06/26/2023    1:54 PM 06/26/2023   10:21 AM 06/26/2023   10:19 AM 01/02/2023    6:34 PM 10/16/2022    4:18 PM 07/10/2018    4:39 PM  Advanced Directives  Does Patient Have a Medical Advance Directive? Yes No No No Unable to assess, patient is non-responsive or altered mental status Yes Yes  Type of Public Librarian Power of [place];Living will     Healthcare Power of [employer] Power of Attorney  Does patient want to make changes to medical advance directive?       No - Patient declined  Copy of Healthcare Power of Attorney in Chart? No - copy requested     No - copy requested No - copy requested    Current Medications (verified) Outpatient Encounter Medications as of  11/05/2023  Medication Sig   amantadine  (SYMMETREL ) 100 MG capsule Take 100 mg by mouth at bedtime.    clotrimazole  (LOTRIMIN ) 1 % cream Apply topically 2 (two) times daily.   cloZAPine  (CLOZARIL ) 100 MG tablet Take 2 tablets (200 mg total) by mouth 2 (two) times daily.   dapagliflozin  propanediol (FARXIGA ) 10 MG TABS tablet Take 1 tablet (10 mg total) by mouth daily.   furosemide  (LASIX ) 40 MG tablet Take 1 tablet (40 mg total) by mouth as needed.   GNP ASPIRIN  81 MG tablet TAKE ONE TABLET BY MOUTH DAILY   linaclotide  (LINZESS ) 290 MCG CAPS capsule Take 1 capsule (290 mcg total) by mouth daily before breakfast.   Melatonin 10 MG SUBL Place 10 mg under the tongue at bedtime.   metoCLOPramide  (REGLAN ) 5 MG tablet Take 1 tablet (5 mg total) by mouth 2 (two) times daily.   metoprolol  succinate (TOPROL  XL) 25 MG 24 hr tablet Take 0.5 tablets (12.5 mg total) by mouth daily.   midodrine  (PROAMATINE ) 5 MG tablet Take 1 tablet (5 mg total) by mouth 2 (two) times daily with a meal.   MYRBETRIQ  50 MG TB24 tablet Take 50 mg by mouth daily.   omeprazole  (PRILOSEC) 40 MG capsule Take 1 capsule (40 mg total) by mouth at bedtime.   PARoxetine  (PAXIL ) 40 MG tablet Take 40 mg by mouth every evening.    polyethylene glycol powder (GLYCOLAX /MIRALAX ) 17 GM/SCOOP powder Take 17 g by mouth See admin instructions. 17 g  twice weekly on Monday and Thursday.   sacubitril -valsartan  (ENTRESTO ) 24-26 MG Take 1 tablet by mouth 2 (two) times daily.   senna-docusate (SENNA PLUS) 8.6-50 MG tablet Take 1 tablet by mouth 2 (two) times daily.   tamsulosin  (FLOMAX ) 0.4 MG CAPS capsule Take 0.4 mg by mouth daily.   zolpidem  (AMBIEN ) 10 MG tablet Take 1 tablet (10 mg total) by mouth at bedtime.   No facility-administered encounter medications on file as of 11/05/2023.    Allergies (verified) Patient has no known allergies.   History: Past Medical History:  Diagnosis Date   AKI (acute kidney injury) (HCC) 06/29/2018   Anemia     Anemia, iron deficiency 09/18/2015   Bowel obstruction (HCC)    BPH (benign prostatic hyperplasia)    Chronic constipation    Chronic systolic heart failure (HCC)    History of ileus    Ileus (HCC) 03/07/2017   Mental retardation    Partial bowel obstruction (HCC)    Schizophrenia (HCC)    Monarche every three months;    SIRS (systemic inflammatory response syndrome) (HCC) 06/29/2018   Small bowel obstruction (HCC) 06/29/2018   History reviewed. No pertinent surgical history. Family History  Problem Relation Age of Onset   Cancer Mother        unknown type   Social History   Socioeconomic History   Marital status: Single    Spouse name: Not on file   Number of children: Not on file   Years of education: Not on file   Highest education level: Not on file  Occupational History   Not on file  Tobacco Use   Smoking status: Every Day    Current packs/day: 0.25    Average packs/day: 0.3 packs/day for 30.0 years (7.5 ttl pk-yrs)    Types: Cigarettes   Smokeless tobacco: Never  Vaping Use   Vaping status: Never Used  Substance and Sexual Activity   Alcohol use: No   Drug use: No   Sexual activity: Never  Other Topics Concern   Not on file  Social History Narrative   Marital status: single      Children: none       Employment: disability due to mental retardation and schizophrenia.      Lives: group home [name]; [facility]; 3 men in group home.        Tobacco: six cigarettes per day; since age 61.      Alcohol:  None      Drugs: none      Exercise:  Walking some.      Seatbelt:  100%   Social Drivers of Corporate Investment Banker Strain: Low Risk  (11/05/2023)   Overall Financial Resource Strain (CARDIA)    Difficulty of Paying Living Expenses: Not hard at all  Food Insecurity: No Food Insecurity (11/05/2023)   Hunger Vital Sign    Worried About Running Out of Food in the Last Year: Never true    Ran Out of Food in the Last Year:  Never true  Transportation Needs: No Transportation Needs (11/05/2023)   PRAPARE - Administrator, Civil Service (Medical): No    Lack of Transportation (Non-Medical): No  Physical Activity: Inactive (11/05/2023)   Exercise Vital Sign    Days of Exercise per Week: 0 days    Minutes of Exercise per Session: 0 min  Stress: No Stress Concern Present (11/05/2023)   [employer] of Occupational Health - Occupational Stress  Questionnaire    Feeling of Stress : Not at all  Social Connections: Moderately Isolated (11/05/2023)   Social Connection and Isolation Panel [NHANES]    Frequency of Communication with Friends and Family: Once a week    Frequency of Social Gatherings with Friends and Family: More than three times a week    Attends Religious Services: 1 to 4 times per year    Active Member of [employer] or Organizations: No    Attends Engineer, Structural: Never    Marital Status: Never married    Tobacco Counseling Ready to quit: Not Answered Counseling given: Not Answered   Clinical Intake:  Pre-visit preparation completed: Yes  Pain : No/denies pain     Nutritional Risks: None Diabetes: No  How often do you need to have someone help you when you read instructions, pamphlets, or other written materials from your doctor or pharmacy?: 3 - Sometimes  Interpreter Needed?: No  Information entered by :: NAllen LPN   Activities of Daily Living    11/05/2023    2:51 PM 01/02/2023    6:37 PM  In your present state of health, do you have any difficulty performing the following activities:  Hearing? 0 0  Vision? 0 0  Difficulty concentrating or making decisions? 0 1  Walking or climbing stairs? 0 0  Dressing or bathing? 0 0  Doing errands, shopping? 0 0  Preparing Food and eating ? N   Using the Toilet? N   In the past six months, have you accidently leaked urine? N   Do you have problems with loss of bowel control? N   Managing your Medications? Y    Managing your Finances? Y   Housekeeping or managing your Housekeeping? N     Patient Care Team: [name] HERO, MD as PCP - General (Family Medicine) Tobb, Kardie, DO as PCP - Cardiology (Cardiology) Aneita Gwendlyn DASEN, MD (Inactive) as Consulting Physician (Gastroenterology) Sable Bran, MD as Consulting Physician (Psychiatry) Cam Morene ORN, MD as Attending Physician (Urology)  Indicate any recent Medical Services you may have received from other than [facility] providers in the past year (date may be approximate).     Assessment:   This is a routine wellness examination for [place].  Hearing/Vision screen Hearing Screening - Comments:: Denies hearing issues Vision Screening - Comments:: No regular eye exams   Goals Addressed             This Visit's Progress    Patient Stated       11/05/2023, eat healthier and drink more water       Depression Screen    11/05/2023    2:57 PM 10/16/2022    4:18 PM 10/11/2021    3:51 PM 10/11/2021    3:49 PM 01/31/2021    9:04 AM 08/29/2015    4:02 PM 05/25/2015   10:19 AM  PHQ 2/9 Scores  PHQ - 2 Score 0 0 0 0 0 0 0    Fall Risk    11/05/2023    2:57 PM 12/25/2022    3:51 PM 10/16/2022    4:18 PM 10/11/2021    3:51 PM 08/29/2015    4:02 PM  Fall Risk   Falls in the past year? 0 0 0 0 No  Number falls in past yr: 0 0 0 0   Injury with Fall? 0 0 0 0   Risk for fall due to : Medication side effect No Fall Risks No Fall Risks;Medication side  effect    Follow up Falls prevention discussed;Falls evaluation completed Falls evaluation completed Falls prevention discussed;Education provided;Falls evaluation completed Falls evaluation completed     MEDICARE RISK AT HOME: Medicare Risk at Home Any stairs in or around the home?: Yes If so, are there any without handrails?: No Home free of loose throw rugs in walkways, pet beds, electrical cords, etc?: Yes Adequate lighting in your home to reduce risk of falls?: Yes Life  alert?: No Use of a cane, walker or w/c?: No Grab bars in the bathroom?: Yes Shower chair or bench in shower?: No Elevated toilet seat or a handicapped toilet?: No  TIMED UP AND GO:  Was the test performed?  No    Cognitive Function:  6 CIT not administered. Patient has diagnosis of an intellectual disability.        Immunizations Immunization History  Administered Date(s) Administered   Influenza, Seasonal, Injecte, Preservative Fre 06/19/2023   Influenza,inj,Quad PF,6+ Mos 08/28/2014, 06/20/2015, 08/11/2021, 09/18/2022   PFIZER Comirnaty(Gray Top)Covid-19 Tri-Sucrose Vaccine 11/04/2019, 11/25/2019, 09/26/2020   PNEUMOCOCCAL CONJUGATE-20 09/23/2023   Pneumococcal Polysaccharide-23 03/30/2009, 04/26/2012   Tdap 03/30/2009, 05/02/2021    TDAP status: Up to date  Flu Vaccine status: Up to date  Pneumococcal vaccine status: Up to date  Covid-19 vaccine status: Information provided on how to obtain vaccines.   Qualifies for Shingles Vaccine? Yes   Zostavax completed No   Shingrix Completed?: No.    Education has been provided regarding the importance of this vaccine. Patient has been advised to call insurance company to determine out of pocket expense if they have not yet received this vaccine. Advised may also receive vaccine at local pharmacy or Health Dept. Verbalized acceptance and understanding.  Screening Tests Health Maintenance  Topic Date Due   COVID-19 Vaccine (4 - 2024-25 season) 11/21/2023 (Originally 06/23/2023)   Zoster Vaccines- Shingrix (1 of 2) 02/03/2024 (Originally 07/29/1975)   Medicare Annual Wellness (AWV)  11/04/2024   Colonoscopy  12/01/2025   DTaP/Tdap/Td (3 - Td or Tdap) 05/03/2031   Pneumonia Vaccine 49+ Years old  Completed   INFLUENZA VACCINE  Completed   Hepatitis C Screening  Completed   HIV Screening  Completed   HPV VACCINES  Aged Out    Health Maintenance  There are no preventive care reminders to display for this  patient.   Colorectal cancer screening: Type of screening: Colonoscopy. Completed 12/02/2015. Repeat every 10 years  Lung Cancer Screening: (Low Dose CT Chest recommended if Age 70-80 years, 20 pack-year currently smoking OR have quit w/in 15years.) does not qualify.   Lung Cancer Screening Referral: no  Additional Screening:  Hepatitis C Screening: does qualify; Completed 01/01/2023  Vision Screening: Recommended annual ophthalmology exams for early detection of glaucoma and other disorders of the eye. Is the patient up to date with their annual eye exam?  No  Who is the provider or what is the name of the office in which the patient attends annual eye exams? none If pt is not established with a provider, would they like to be referred to a provider to establish care? No .   Dental Screening: Recommended annual dental exams for proper oral hygiene  Diabetic Foot Exam: n/a  Community Resource Referral / Chronic Care Management: CRR required this visit?  No   CCM required this visit?  No     Plan:     I have personally reviewed and noted the following in the patient's chart:   Medical and social history  Use of alcohol, tobacco or illicit drugs  Current medications and supplements including opioid prescriptions. Patient is not currently taking opioid prescriptions. Functional ability and status Nutritional status Physical activity Advanced directives List of other physicians Hospitalizations, surgeries, and ER visits in previous 12 months Vitals Screenings to include cognitive, depression, and falls Referrals and appointments  In addition, I have reviewed and discussed with patient certain preventive protocols, quality metrics, and best practice recommendations. A written personalized care plan for preventive services as well as general preventive health recommendations were provided to patient.     Ardella FORBES Dawn, LPN   [id number]   After Visit Summary: (Pick Up) Due  to this being a telephonic visit, with patients personalized plan was offered to patient and patient has requested to Pick up at office.  Nurse Notes: none

## 2023-11-07 ENCOUNTER — Other Ambulatory Visit: Payer: Self-pay | Admitting: Family Medicine

## 2023-11-07 DIAGNOSIS — Z860100 Personal history of colon polyps, unspecified: Secondary | ICD-10-CM

## 2023-11-11 ENCOUNTER — Other Ambulatory Visit: Payer: Self-pay

## 2023-11-11 DIAGNOSIS — Z860100 Personal history of colon polyps, unspecified: Secondary | ICD-10-CM

## 2023-11-11 MED ORDER — ASPIRIN 81 MG PO TBEC
81.0000 mg | DELAYED_RELEASE_TABLET | Freq: Every day | ORAL | 3 refills | Status: DC
Start: 2023-11-11 — End: 2024-02-07

## 2023-11-25 ENCOUNTER — Encounter (HOSPITAL_COMMUNITY): Payer: Self-pay

## 2023-11-25 ENCOUNTER — Ambulatory Visit (INDEPENDENT_AMBULATORY_CARE_PROVIDER_SITE_OTHER): Payer: Medicare Other

## 2023-11-25 ENCOUNTER — Ambulatory Visit (HOSPITAL_COMMUNITY)
Admission: EM | Admit: 2023-11-25 | Discharge: 2023-11-25 | Disposition: A | Discharge status: Home / Self Care | Payer: Medicare Other | Attending: Family Medicine | Admitting: Family Medicine

## 2023-11-25 DIAGNOSIS — R531 Weakness: Secondary | ICD-10-CM | POA: Insufficient documentation

## 2023-11-25 DIAGNOSIS — R0602 Shortness of breath: Secondary | ICD-10-CM

## 2023-11-25 LAB — POCT URINALYSIS DIP (MANUAL ENTRY)
Bilirubin, UA: NEGATIVE
Glucose, UA: >=1000 mg/dL — AB
Leukocytes, UA: NEGATIVE
Nitrite, UA: NEGATIVE
Protein Ur, POC: 100 mg/dL — AB
Spec Grav, UA: 1.02 (ref 1.010–1.025)
Urobilinogen, UA: 1 U/dL
pH, UA: 6 (ref 5.0–8.0)

## 2023-11-25 LAB — CBC
HCT: 40.1 % (ref 39.0–52.0)
Hemoglobin: 13.4 g/dL (ref 13.0–17.0)
MCH: 31.2 pg (ref 26.0–34.0)
MCHC: 33.4 g/dL (ref 30.0–36.0)
MCV: 93.3 fL (ref 80.0–100.0)
Platelets: 281 10*3/uL (ref 150–400)
RBC: 4.3 MIL/uL (ref 4.22–5.81)
RDW: 16.4 % — ABNORMAL HIGH (ref 11.5–15.5)
WBC: 13.7 10*3/uL — ABNORMAL HIGH (ref 4.0–10.5)
nRBC: 0 % (ref 0.0–0.2)

## 2023-11-25 LAB — BASIC METABOLIC PANEL
Anion gap: 11 (ref 5–15)
BUN: 15 mg/dL (ref 8–23)
CO2: 22 mmol/L (ref 22–32)
Calcium: 9.2 mg/dL (ref 8.9–10.3)
Chloride: 109 mmol/L (ref 98–111)
Creatinine, Ser: 1.23 mg/dL (ref 0.61–1.24)
GFR, Estimated: >60 mL/min (ref >60)
Glucose, Bld: 107 mg/dL — ABNORMAL HIGH (ref 70–99)
Potassium: 4.1 mmol/L (ref 3.5–5.1)
Sodium: 142 mmol/L (ref 135–145)

## 2023-11-25 MED ORDER — AZITHROMYCIN 250 MG PO TABS: 250.0000 mg | ORAL_TABLET | Freq: Every day | ORAL | 0 refills | Status: DC

## 2023-11-25 MED ORDER — AMOXICILLIN-POT CLAVULANATE 875-125 MG PO TABS: 1.0000 | ORAL_TABLET | Freq: Two times a day (BID) | ORAL | 0 refills | Status: DC

## 2023-11-25 NOTE — ED Provider Notes (Addendum)
[facility]    CSN: [id number] Arrival date & time: 11/25/23  [id number]      History   Chief Complaint Chief Complaint  Patient presents with   Weakness    HPI Jordan Kennedy is a 68 y.o. male.   Patient presenting alongside his caretaker.  Patient states that on Saturday night he went to bed felt weak as well as dizzy as well as some shortness of breath.  Patient states that this is mainly occurring when he is trying to sleep.  Patient states that he felt fine on Sunday but then Sunday night he had something similar.  Patient states that he does not have a cough or any other systemic symptoms such as fevers, chills or nausea vomiting.  Patient notes some mild abdominal pain but nothing significant.  Patient states he feels fine now and that his symptoms have mostly resolved.  Patient mainly states that he feels dizzy and weak and that is why he has trouble going to sleep at night.  Patient does have an appoint with his cardiologist tomorrow.   Weakness   Past Medical History:  Diagnosis Date   AKI (acute kidney injury) (HCC) 06/29/2018   Anemia    Anemia, iron deficiency 09/18/2015   Bowel obstruction (HCC)    BPH (benign prostatic hyperplasia)    Chronic constipation    Chronic systolic heart failure (HCC)    History of ileus    Ileus (HCC) 03/07/2017   Mental retardation    Partial bowel obstruction (HCC)    Schizophrenia (HCC)    Monarche every three months;    SIRS (systemic inflammatory response syndrome) (HCC) 06/29/2018   Small bowel obstruction (HCC) 06/29/2018    Patient Active Problem List   Diagnosis Date Noted   Enuresis 06/19/2023   Left bundle branch block 02/13/2023   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   Tinea pedis of both feet 01/11/2023   Chronic systolic heart failure (HCC) 01/03/2023   Prolonged QT interval 01/01/2023   Transaminitis 01/01/2023   Thrombocytosis 01/01/2023   Tardive dyskinesia  11/20/2021   Lower urinary tract symptoms (LUTS) 05/02/2021   History of small bowel obstruction 01/31/2021   History of colon polyps 01/31/2021   Onychomycosis 01/31/2021   Gastroesophageal reflux disease 01/31/2021   Tobacco use disorder, mild, 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 No pertinent surgical history.     Home Medications    Prior to Admission medications   Medication Sig Start Date End Date Taking? Authorizing Provider  amoxicillin-clavulanate (AUGMENTIN) 875-125 MG tablet Take 1 tablet by mouth every 12 (twelve) hours. 11/25/23  Yes Brenton Grills, MD  azithromycin (ZITHROMAX Z-PAK) 250 MG tablet Take 1 tablet (250 mg total) by mouth daily. Take 2 pills on first day followed by 1 pill daily after that 11/25/23  Yes Brenton Grills, MD  amantadine (SYMMETREL) 100 MG capsule Take 100 mg by mouth at bedtime.     [provider]  aspirin EC (GNP ASPIRIN) 81 MG tablet Take 1 tablet (81 mg total) by mouth daily. 11/11/23   Loyola Mast, MD  clotrimazole (LOTRIMIN) 1 % cream Apply topically 2 (two) times daily. 09/23/23   Loyola Mast, MD  cloZAPine (CLOZARIL) 100 MG tablet Take 2 tablets (200 mg total) by  mouth 2 (two) times daily. 03/12/17   Rodolph Bong, MD  dapagliflozin propanediol (FARXIGA) 10 MG TABS tablet Take 1 tablet (10 mg total) by mouth daily. 01/11/23   Loyola Mast, MD  furosemide (LASIX) 40 MG tablet Take 1 tablet (40 mg total) by mouth as needed. 02/13/23   Maisie Fus, MD  linaclotide Karlene Einstein) 290 MCG CAPS capsule Take 1 capsule (290 mcg total) by mouth daily before breakfast. 06/19/23   Loyola Mast, MD  Melatonin 10 MG SUBL Place 10 mg under the tongue at bedtime.    [provider]  metoCLOPramide (REGLAN) 5 MG tablet Take 1 tablet (5 mg total) by mouth 2 (two)  times daily. 01/11/23   Loyola Mast, MD  metoprolol succinate (TOPROL XL) 25 MG 24 hr tablet Take 0.5 tablets (12.5 mg total) by mouth daily. 07/05/23   Carlos Levering, NP  midodrine (PROAMATINE) 5 MG tablet Take 1 tablet (5 mg total) by mouth 2 (two) times daily with a meal. 09/12/23   Tobb, Kardie, DO  MYRBETRIQ 50 MG TB24 tablet Take 50 mg by mouth daily. 01/06/21   [provider]  omeprazole (PRILOSEC) 40 MG capsule Take 1 capsule (40 mg total) by mouth at bedtime. 01/11/23   Loyola Mast, MD  PARoxetine (PAXIL) 40 MG tablet Take 40 mg by mouth every evening.     [provider]  polyethylene glycol powder (GLYCOLAX/MIRALAX) 17 GM/SCOOP powder Take 17 g by mouth See admin instructions. 17 g twice weekly on Monday and Thursday. 01/11/23   Loyola Mast, MD  sacubitril-valsartan (ENTRESTO) 24-26 MG Take 1 tablet by mouth 2 (two) times daily. 01/11/23   Loyola Mast, MD  senna-docusate (SENNA PLUS) 8.6-50 MG tablet Take 1 tablet by mouth 2 (two) times daily. 03/19/23   Loyola Mast, MD  tamsulosin (FLOMAX) 0.4 MG CAPS capsule Take 0.4 mg by mouth daily. 01/10/21   [provider]  zolpidem (AMBIEN) 10 MG tablet Take 1 tablet (10 mg total) by mouth at bedtime. 06/19/23   Loyola Mast, MD    Family History Family History  Problem Relation Age of Onset   Cancer Mother        unknown type    Social History Social History   Tobacco Use   Smoking status: Former    Current packs/day: 0.25    Average packs/day: 0.3 packs/day for 30.0 years (7.5 ttl pk-yrs)    Types: Cigarettes   Smokeless tobacco: Never  Vaping Use   Vaping status: Never Used  Substance Use Topics   Alcohol use: No   Drug use: No     Allergies   Patient has no known allergies.   Review of Systems Review of Systems  Neurological:  Positive for weakness.     Physical Exam Triage Vital Signs ED Triage Vitals  Encounter Vitals Group     BP 11/25/23 0943 123/88      Systolic BP Percentile --      Diastolic BP Percentile --      Pulse Rate 11/25/23 0943 (!) 107     Resp 11/25/23 0943 16     Temp 11/25/23 0943 98.8 F (37.1 C)     Temp Source 11/25/23 0943 Oral     SpO2 11/25/23 0943 96 %     Weight --      Height --      Head Circumference --      Peak Flow --  Pain Score 11/25/23 0944 0     Pain Loc --      Pain Education --      Exclude from Growth Chart --    No data found.  Updated Vital Signs BP 123/88 (BP Location: Right Arm)   Pulse (!) 107   Temp 98.8 F (37.1 C) (Oral)   Resp 16   SpO2 96%   Visual Acuity Right Eye Distance:   Left Eye Distance:   Bilateral Distance:    Right Eye Near:   Left Eye Near:    Bilateral Near:     Physical Exam Constitutional:      Appearance: Normal appearance.  HENT:     Head: Normocephalic and atraumatic.  Cardiovascular:     Rate and Rhythm: Regular rhythm. Tachycardia present.     Pulses: Normal pulses.     Heart sounds: Normal heart sounds.  Pulmonary:     Effort: Pulmonary effort is normal.     Breath sounds: Normal breath sounds. No wheezing.  Neurological:     General: No focal deficit present.     Mental Status: He is alert and oriented to person, place, and time. Mental status is at baseline.  Psychiatric:        Mood and Affect: Mood normal.      UC Treatments / Results  Labs (all labs ordered are listed, but only abnormal results are displayed) Labs Reviewed  BASIC METABOLIC PANEL - Abnormal; Notable for the following components:      Result Value   Glucose, Bld 107 (*)    All other components within normal limits  CBC - Abnormal; Notable for the following components:   WBC 13.7 (*)    RDW 16.4 (*)    All other components within normal limits  POCT URINALYSIS DIP (MANUAL ENTRY) - Abnormal; Notable for the following components:   Color, UA orange (*)    Glucose, UA >=1,000 (*)    Ketones, POC UA trace (5) (*)    Blood, UA moderate (*)    Protein Ur, POC  =100 (*)    All other components within normal limits    EKG   Radiology DG Chest 2 View Result Date: 11/25/2023 CLINICAL DATA:  Shortness of breath EXAM: CHEST - 2 VIEW COMPARISON:  06/26/2023 FINDINGS: Heart and mediastinal contours within normal limits. Diffuse interstitial prominence without confluent opacity or effusions. No acute bony abnormality. IMPRESSION: Diffuse interstitial prominence could reflect edema, atypical infection or chronic lung disease. Electronically Signed   By: Charlett Nose M.D.   On: 11/25/2023 10:57    Procedures Procedures (including critical care time)  Medications Ordered in UC Medications - No data to display  Initial Impression / Assessment and Plan / UC Course  I have reviewed the triage vital signs and the nursing notes.  Pertinent labs & imaging results that were available during my care of the patient were reviewed by me and considered in my medical decision making (see chart for details).     Patient presenting with random dizziness and weakness mainly at night.  Patient also noted to be tachycardic in clinic today, respiratory is at approximately 96% and rest of vitals are within normal limits.  BMP, CMP as well as UA were done.  CBC shows a mildly elevated white count however white count has been elevated at all previous labs.  UA is unremarkable for infection.  BMP is also unremarkable.  Chest ray was also done which shows some interstitial prominence  which could be consistent with an infection.  Will go ahead and treat patient for possible CAP with Augmentin and azithromycin.  Patient was advised to continue his appointment with his cardiologist tomorrow and see if he would likely need a Holter monitor to determine the cause of the dizziness and weakness. Final Clinical Impressions(s) / UC Diagnoses   Final diagnoses:  Weakness     Discharge Instructions      2 different antibiotics, please take them over the next 5 days.  Please  follow-up with your cardiologist tomorrow regarding the weakness you have been feeling.  You may need a Holter monitor to determine if this is cardiac issue that has been going on     ED Prescriptions     Medication Sig Dispense Auth. Provider   amoxicillin-clavulanate (AUGMENTIN) 875-125 MG tablet Take 1 tablet by mouth every 12 (twelve) hours. 10 tablet Brenton Grills, MD   azithromycin (ZITHROMAX Z-PAK) 250 MG tablet Take 1 tablet (250 mg total) by mouth daily. Take 2 pills on first day followed by 1 pill daily after that 6 tablet Brenton Grills, MD      PDMP not reviewed this encounter.   Brenton Grills, MD 11/25/23 1114    Brenton Grills, MD 11/25/23 [phone]

## 2023-11-25 NOTE — ED Triage Notes (Addendum)
Patient is from a group home.  Caretaker reports that the patient had weakness and SOB 2 days ago and yesterday repeated the same, but has been paing through out the night and not sleeping.  Caretaker reports that th epaatient had a stomach virus with diarrhea 2 weeks ago.

## 2023-11-25 NOTE — Discharge Instructions (Addendum)
2 different antibiotics, please take them over the next 5 days.  Please follow-up with your cardiologist tomorrow regarding the weakness you have been feeling.  You may need a Holter monitor to determine if this is cardiac issue that has been going on

## 2023-11-26 ENCOUNTER — Ambulatory Visit: Payer: Medicare Other | Attending: Cardiology | Admitting: Cardiology

## 2023-11-26 ENCOUNTER — Encounter: Payer: Self-pay | Admitting: Cardiology

## 2023-11-26 ENCOUNTER — Ambulatory Visit: Payer: Medicare Other | Admitting: Cardiology

## 2023-11-26 VITALS — BP 114/68 | HR 103 | Ht 74.0 in | Wt 181.0 lb

## 2023-11-26 DIAGNOSIS — I959 Hypotension, unspecified: Secondary | ICD-10-CM

## 2023-11-26 DIAGNOSIS — R0989 Other specified symptoms and signs involving the circulatory and respiratory systems: Secondary | ICD-10-CM | POA: Diagnosis present

## 2023-11-26 NOTE — Patient Instructions (Signed)
 Medication Instructions:  Your physician recommends that you continue on your current medications as directed. Please refer to the Current Medication list given to you today.  *If you need a refill on your cardiac medications before your next appointment, please call your pharmacy*    Follow-Up: At [facility], you and your health needs are our priority.  As part of our continuing mission to provide you with exceptional heart care, we have created designated Provider Care Teams.  These Care Teams include your primary Cardiologist (physician) and Advanced Practice Providers (APPs -  Physician Assistants and Nurse Practitioners) who all work together to provide you with the care you need, when you need it.   Your next appointment:   6 month(s)  Provider:   Thomasene Ripple, DO

## 2023-11-30 NOTE — Progress Notes (Signed)
 Cardiology Office Note:    Date:  11/30/2023   ID:  Jordan Kennedy, DOB Mar 30, 1956, MRN [id number]  PCP:  [name] HERO, MD  Cardiologist:  Dub Huntsman, DO  Electrophysiologist:  None   Referring MD: [name] HERO, MD    I am ok  History of Present Illness:    Jordan Kennedy is a 68 y.o. male with a hx of cognitive Paramed, tardive dyskinesia and depressed ejection fraction.  Recently experienced an illness that required a visit to urgent care and a course of antibiotics. He reports feeling better since starting the antibiotics. The caregiver notes that the patient's blood pressure has been fluctuating, but generally staying in the 100s. The patient also had blood work done recently.  Past Medical History:  Diagnosis Date   AKI (acute kidney injury) (HCC) 06/29/2018   Anemia    Anemia, iron deficiency 09/18/2015   Bowel obstruction (HCC)    BPH (benign prostatic hyperplasia)    Chronic constipation    Chronic systolic heart failure (HCC)    History of ileus    Ileus (HCC) 03/07/2017   Mental retardation    Partial bowel obstruction (HCC)    Schizophrenia (HCC)    Monarche every three months;    SIRS (systemic inflammatory response syndrome) (HCC) 06/29/2018   Small bowel obstruction (HCC) 06/29/2018    No past surgical history on file.  Current Medications: Current Meds  Medication Sig   amantadine  (SYMMETREL ) 100 MG capsule Take 100 mg by mouth at bedtime.    amoxicillin -clavulanate (AUGMENTIN ) 875-125 MG tablet Take 1 tablet by mouth every 12 (twelve) hours.   aspirin  EC (GNP ASPIRIN ) 81 MG tablet Take 1 tablet (81 mg total) by mouth daily.   azithromycin  (ZITHROMAX  Z-PAK) 250 MG tablet Take 1 tablet (250 mg total) by mouth daily. Take 2 pills on first day followed by 1 pill daily after that   clotrimazole  (LOTRIMIN ) 1 % cream Apply topically 2 (two) times daily.   cloZAPine  (CLOZARIL ) 100 MG tablet Take 2 tablets (200 mg total) by mouth 2 (two) times daily.    dapagliflozin  propanediol (FARXIGA ) 10 MG TABS tablet Take 1 tablet (10 mg total) by mouth daily.   furosemide  (LASIX ) 40 MG tablet Take 1 tablet (40 mg total) by mouth as needed.   linaclotide  (LINZESS ) 290 MCG CAPS capsule Take 1 capsule (290 mcg total) by mouth daily before breakfast.   Melatonin 10 MG SUBL Place 10 mg under the tongue at bedtime.   metoCLOPramide  (REGLAN ) 5 MG tablet Take 1 tablet (5 mg total) by mouth 2 (two) times daily.   metoprolol  succinate (TOPROL  XL) 25 MG 24 hr tablet Take 0.5 tablets (12.5 mg total) by mouth daily.   midodrine  (PROAMATINE ) 5 MG tablet Take 1 tablet (5 mg total) by mouth 2 (two) times daily with a meal.   MYRBETRIQ  50 MG TB24 tablet Take 50 mg by mouth daily.   omeprazole  (PRILOSEC) 40 MG capsule Take 1 capsule (40 mg total) by mouth at bedtime.   PARoxetine  (PAXIL ) 40 MG tablet Take 40 mg by mouth every evening.    polyethylene glycol powder (GLYCOLAX /MIRALAX ) 17 GM/SCOOP powder Take 17 g by mouth See admin instructions. 17 g twice weekly on Monday and Thursday.   sacubitril -valsartan  (ENTRESTO ) 24-26 MG Take 1 tablet by mouth 2 (two) times daily.   senna-docusate (SENNA PLUS) 8.6-50 MG tablet Take 1 tablet by mouth 2 (two) times daily.   tamsulosin  (FLOMAX ) 0.4 MG CAPS capsule Take 0.4 mg  by mouth daily.   zolpidem  (AMBIEN ) 10 MG tablet Take 1 tablet (10 mg total) by mouth at bedtime.     Allergies:   Patient has no known allergies.   Social History   Socioeconomic History   Marital status: Single    Spouse name: Not on file   Number of children: Not on file   Years of education: Not on file   Highest education level: Not on file  Occupational History   Not on file  Tobacco Use   Smoking status: Former    Current packs/day: 0.25    Average packs/day: 0.3 packs/day for 30.0 years (7.5 ttl pk-yrs)    Types: Cigarettes   Smokeless tobacco: Never  Vaping Use   Vaping status: Never Used  Substance and Sexual Activity   Alcohol use: No    Drug use: No   Sexual activity: Never  Other Topics Concern   Not on file  Social History Narrative   Marital status: single      Children: none       Employment: disability due to mental retardation and schizophrenia.      Lives: group home [name]; 3 men in group home.        Tobacco: six cigarettes per day; since age 28.      Alcohol:  None      Drugs: none      Exercise:  Walking some.      Seatbelt:  100%   Social Drivers of Corporate Investment Banker Strain: Low Risk  (11/05/2023)   Overall Financial Resource Strain (CARDIA)    Difficulty of Paying Living Expenses: Not hard at all  Food Insecurity: No Food Insecurity (11/05/2023)   Hunger Vital Sign    Worried About Running Out of Food in the Last Year: Never true    Ran Out of Food in the Last Year: Never true  Transportation Needs: No Transportation Needs (11/05/2023)   PRAPARE - Administrator, Civil Service (Medical): No    Lack of Transportation (Non-Medical): No  Physical Activity: Inactive (11/05/2023)   Exercise Vital Sign    Days of Exercise per Week: 0 days    Minutes of Exercise per Session: 0 min  Stress: No Stress Concern Present (11/05/2023)   [employer] of Occupational Health - Occupational Stress Questionnaire    Feeling of Stress : Not at all  Social Connections: Moderately Isolated (11/05/2023)   Social Connection and Isolation Panel [NHANES]    Frequency of Communication with Friends and Family: Once a week    Frequency of Social Gatherings with Friends and Family: More than three times a week    Attends Religious Services: 1 to 4 times per year    Active Member of [employer] or Organizations: No    Attends Banker Meetings: Never    Marital Status: Never married     Family History: The patient's family history includes Cancer in his mother.  ROS:   Review of Systems  Constitution: Negative for decreased appetite, fever and  weight gain.  HENT: Negative for congestion, ear discharge, hoarse voice and sore throat.   Eyes: Negative for discharge, redness, vision loss in right eye and visual halos.  Cardiovascular: Negative for chest pain, dyspnea on exertion, leg swelling, orthopnea and palpitations.  Respiratory: Negative for cough, hemoptysis, shortness of breath and snoring.   Endocrine: Negative for heat intolerance and polyphagia.  Hematologic/Lymphatic: Negative for bleeding problem.  Does not bruise/bleed easily.  Skin: Negative for flushing, nail changes, rash and suspicious lesions.  Musculoskeletal: Negative for arthritis, joint pain, muscle cramps, myalgias, neck pain and stiffness.  Gastrointestinal: Negative for abdominal pain, bowel incontinence, diarrhea and excessive appetite.  Genitourinary: Negative for decreased libido, genital sores and incomplete emptying.  Neurological: Negative for brief paralysis, focal weakness, headaches and loss of balance.  Psychiatric/Behavioral: Negative for altered mental status, depression and suicidal ideas.  Allergic/Immunologic: Negative for HIV exposure and persistent infections.    EKGs/Labs/Other Studies Reviewed:    The following studies were reviewed today:   EKG:  The ekg ordered today demonstrates   Recent Labs: 01/04/2023: TSH 0.850 02/13/2023: Pro B Natriuretic peptide (BNP) 933.0 06/25/2023: ALT 23; Magnesium  2.1 06/26/2023: B Natriuretic Peptide 290.0 11/25/2023: BUN 15; Creatinine, Ser 1.23; Hemoglobin 13.4; Platelets 281; Potassium 4.1; Sodium 142  Recent Lipid Panel    Component Value Date/Time   CHOL 144 01/31/2021 1000   TRIG 143.0 01/31/2021 1000   HDL 41.40 01/31/2021 1000   CHOLHDL 3 01/31/2021 1000   VLDL 28.6 01/31/2021 1000   LDLCALC 74 01/31/2021 1000    Physical Exam:    VS:  BP 114/68 (BP Location: Right Arm, Patient Position: Sitting, Cuff Size: Normal)   Pulse (!) 103   Ht 6' 2 (1.88 m)   Wt 181 lb (82.1 kg)   SpO2 93%    BMI 23.24 kg/m     Wt Readings from Last 3 Encounters:  11/26/23 181 lb (82.1 kg)  09/23/23 181 lb 9.6 oz (82.4 kg)  09/11/23 181 lb 9.6 oz (82.4 kg)     GEN: Well nourished, well developed in no acute distress HEENT: Normal NECK: No JVD; No carotid bruits LYMPHATICS: No lymphadenopathy CARDIAC: S1S2 noted,RRR, no murmurs, rubs, gallops RESPIRATORY:  Clear to auscultation without rales, wheezing or rhonchi  ABDOMEN: Soft, non-tender, non-distended, +bowel sounds, no guarding. EXTREMITIES: No edema, No cyanosis, no clubbing MUSCULOSKELETAL:  No deformity  SKIN: Warm and dry NEUROLOGIC:  Alert and oriented x 3, non-focal PSYCHIATRIC:  Normal affect, good insight  ASSESSMENT:    1. Depressed left ventricular ejection fraction   2. Hypotension, unspecified hypotension type     PLAN:     Hypotension - blood pressure improved on midodrine  but still on the lower side.  Continue entresto , farxiga  and toprol  xl. Keep off aldactone  for now   Depressed ejection fraction - will plan follow up echo at next visit.   Follow-up in 12 weeks to assess blood pressure and heart function.  The patient is in agreement with the above plan. The patient left the office in stable condition.  The patient will follow up in 6 months.   Medication Adjustments/Labs and Tests Ordered: Current medicines are reviewed at length with the patient today.  Concerns regarding medicines are outlined above.  No orders of the defined types were placed in this encounter.  No orders of the defined types were placed in this encounter.   Patient Instructions  Medication Instructions:  Your physician recommends that you continue on your current medications as directed. Please refer to the Current Medication list given to you today.  *If you need a refill on your cardiac medications before your next appointment, please call your pharmacy*   Follow-Up: At [facility], you and your health needs are our  priority.  As part of our continuing mission to provide you with exceptional heart care, we have created designated Provider Care Teams.  These Care Teams  include your primary Cardiologist (physician) and Advanced Practice Providers (APPs -  Physician Assistants and Nurse Practitioners) who all work together to provide you with the care you need, when you need it.   Your next appointment:   6 month(s)  Provider:   Tay Whitwell, DO        Adopting a Healthy Lifestyle.  Know what a healthy weight is for you (roughly BMI <25) and aim to maintain this   Aim for 7+ servings of fruits and vegetables daily   65-80+ fluid ounces of water or unsweet tea for healthy kidneys   Limit to max 1 drink of alcohol per day; avoid smoking/tobacco   Limit animal fats in diet for cholesterol and heart health - choose grass fed whenever available   Avoid highly processed foods, and foods high in saturated/trans fats   Aim for low stress - take time to unwind and care for your mental health   Aim for 150 min of moderate intensity exercise weekly for heart health, and weights twice weekly for bone health   Aim for 7-9 hours of sleep daily   When it comes to diets, agreement about the perfect plan isnt easy to find, even among the experts. Experts at the [facility] [employer] developed an idea known as the Healthy Eating Plate. Just imagine a plate divided into logical, healthy portions.   The emphasis is on diet quality:   Load up on vegetables and fruits - one-half of your plate: Aim for color and variety, and remember that potatoes dont count.   Go for whole grains - one-quarter of your plate: Whole wheat, barley, wheat berries, quinoa, oats, brown rice, and foods made with them. If you want pasta, go with whole wheat pasta.   Protein power - one-quarter of your plate: Fish, chicken, beans, and nuts are all healthy, versatile protein sources. Limit red meat.   The diet, however, does go  beyond the plate, offering a few other suggestions.   Use healthy plant oils, such as olive, canola, soy, corn, sunflower and peanut. Check the labels, and avoid partially hydrogenated oil, which have unhealthy trans fats.   If youre thirsty, drink water. Coffee and tea are good in moderation, but skip sugary drinks and limit milk and dairy products to one or two daily servings.   The type of carbohydrate in the diet is more important than the amount. Some sources of carbohydrates, such as vegetables, fruits, whole grains, and beans-are healthier than others.   Finally, stay active  Signed, Dub Huntsman, DO  11/30/2023 9:29 PM    [facility]

## 2023-12-04 ENCOUNTER — Other Ambulatory Visit: Payer: Self-pay

## 2023-12-04 MED ORDER — MIDODRINE HCL 5 MG PO TABS: 5.0000 mg | ORAL_TABLET | Freq: Two times a day (BID) | ORAL | 3 refills | Status: DC

## 2023-12-23 ENCOUNTER — Ambulatory Visit (INDEPENDENT_AMBULATORY_CARE_PROVIDER_SITE_OTHER): Payer: Medicare Other | Admitting: Family Medicine

## 2023-12-23 ENCOUNTER — Encounter: Payer: Self-pay | Admitting: Family Medicine

## 2023-12-23 VITALS — BP 120/74 | HR 113 | Temp 98.7°F | Ht 74.0 in | Wt 185.4 lb

## 2023-12-23 DIAGNOSIS — I5022 Chronic systolic (congestive) heart failure: Secondary | ICD-10-CM | POA: Diagnosis not present

## 2023-12-23 DIAGNOSIS — F79 Unspecified intellectual disabilities: Secondary | ICD-10-CM | POA: Diagnosis not present

## 2023-12-23 DIAGNOSIS — F209 Schizophrenia, unspecified: Secondary | ICD-10-CM

## 2023-12-23 DIAGNOSIS — K5901 Slow transit constipation: Secondary | ICD-10-CM

## 2023-12-23 NOTE — Assessment & Plan Note (Signed)
Stable. Continue current regimen for maintaining regular bowel movements. and use of linactolide (Linzess)

## 2023-12-23 NOTE — Assessment & Plan Note (Addendum)
 Compensated. Weight is stable. Continue dapagliflozin 10 mg daily, furosemide 40 mg daily as needed, Entresto 24-26 mg bid, and metoprolol succinate 25 mg 1/2 tab daily. Continue midodrine 5 mg bid for hypotension. Recommend Jordan Kennedy increase physical activity during the day on Sat and Sun to see if this will reduce pacing at night.

## 2023-12-23 NOTE — Progress Notes (Signed)
 [facility] [address] [place] [address] Dept: [phone] Dept Fax: [phone]  Chronic Care Office Visit  Subjective:    Patient ID: Jordan Kennedy, male    DOB: November 13, 1955, 68 y.o..   MRN: [id number]  Chief Complaint  Patient presents with   Follow-up    3 Month f/u.  Non concerns.     History of Present Illness:  Patient is in today for reassessment of chronic medical issues. Mr. House presents with his case worker, Sarajane Marek. He currently lives in a group home.    Mr. Kimberley has a history of schizophrenia, anxiety with depression, and intellectual disability. He is currently followed by Dr. Jannifer Franklin (psychiatry). He is managed on clozapine, paroxetine, and amantadine.     Mr. Moyano has a history of chronic HFrEF. He is managed on dapagliflozin 10 mg daily, furosemide 40 mg daily as needed, Entresto 24-26 mg bid, metoprolol succinate 12.5 mg daily. He has not been seeing any swelling to his lower legs and has not complained of shortness of breath. He has had some hypotension, but is managing well off of spironolactone and taking midodrine 5 mg bid. Mr. Beach has complained of "weakness". This apparently happens at night and on only on the weekends. He notes this when he is up walking/pacing.  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   Tinea pedis of both feet 01/11/2023   Chronic systolic heart failure (HCC) 01/03/2023   Prolonged QT interval 01/01/2023   Transaminitis 01/01/2023   Thrombocytosis 01/01/2023   Tardive dyskinesia 11/20/2021   Lower urinary tract symptoms (LUTS) 05/02/2021   History of small bowel obstruction 01/31/2021   History of colon polyps 01/31/2021   Onychomycosis 01/31/2021   Gastroesophageal reflux disease  01/31/2021   Tobacco use disorder, mild, in sustained remission 02/01/2015   Constipation due to slow transit 04/25/2012   Anxiety and depression 04/25/2012   Edentulous 04/25/2012   Schizophrenia (HCC)    Intellectual disability    No past surgical history on file. Family History  Problem Relation Age of Onset   Cancer Mother        unknown type   Outpatient Medications Prior to Visit  Medication Sig Dispense Refill   amantadine (SYMMETREL) 100 MG capsule Take 100 mg by mouth at bedtime.      aspirin EC (GNP ASPIRIN) 81 MG tablet Take 1 tablet (81 mg total) by mouth daily. 90 tablet 3   clotrimazole (LOTRIMIN) 1 % cream Apply topically 2 (two) times daily. 30 g 5   cloZAPine (CLOZARIL) 100 MG tablet Take 2 tablets (200 mg total) by mouth 2 (two) times daily. 120 tablet 0   dapagliflozin propanediol (FARXIGA) 10 MG TABS tablet Take 1 tablet (10 mg total) by mouth daily. 30 tablet 11   furosemide (LASIX) 40 MG tablet Take 1 tablet (40 mg total) by mouth as needed. 45 tablet 3   linaclotide (LINZESS) 290 MCG CAPS capsule Take 1 capsule (290 mcg total) by mouth daily before breakfast. 30 capsule 5   Melatonin 10 MG SUBL Place 10 mg under the tongue at bedtime.     metoCLOPramide (REGLAN) 5 MG tablet Take 1 tablet (5 mg total) by mouth 2 (two) times daily. 60 tablet 11   metoprolol succinate (TOPROL XL) 25 MG 24  hr tablet Take 0.5 tablets (12.5 mg total) by mouth daily. 90 tablet 1   midodrine (PROAMATINE) 5 MG tablet Take 1 tablet (5 mg total) by mouth 2 (two) times daily with a meal. 180 tablet 3   MYRBETRIQ 50 MG TB24 tablet Take 50 mg by mouth daily.     omeprazole (PRILOSEC) 40 MG capsule Take 1 capsule (40 mg total) by mouth at bedtime. 30 capsule 11   PARoxetine (PAXIL) 40 MG tablet Take 40 mg by mouth every evening.      polyethylene glycol powder (GLYCOLAX/MIRALAX) 17 GM/SCOOP powder Take 17 g by mouth See admin instructions. 17 g twice weekly on Monday and Thursday. 510 g 11    sacubitril-valsartan (ENTRESTO) 24-26 MG Take 1 tablet by mouth 2 (two) times daily. 60 tablet 11   senna-docusate (SENNA PLUS) 8.6-50 MG tablet Take 1 tablet by mouth 2 (two) times daily. 180 tablet 3   tamsulosin (FLOMAX) 0.4 MG CAPS capsule Take 0.4 mg by mouth daily.     zolpidem (AMBIEN) 10 MG tablet Take 1 tablet (10 mg total) by mouth at bedtime. 30 tablet 5   amoxicillin-clavulanate (AUGMENTIN) 875-125 MG tablet Take 1 tablet by mouth every 12 (twelve) hours. 10 tablet 0   azithromycin (ZITHROMAX Z-PAK) 250 MG tablet Take 1 tablet (250 mg total) by mouth daily. Take 2 pills on first day followed by 1 pill daily after that 6 tablet 0   No facility-administered medications prior to visit.   No Known Allergies Objective:   Today's Vitals   12/23/23 0923  BP: 120/74  Pulse: (!) 113  Temp: 98.7 F (37.1 C)  TempSrc: Temporal  SpO2: 99%  Weight: 185 lb 6.4 oz (84.1 kg)  Height: 6\' 2"  (1.88 m)   Body mass index is 23.8 kg/m.   General: Well developed, well nourished. No acute distress. HEENT: Normocephalic, non-traumatic. External ears normal. EAC and TMs normal bilaterally. PERRL, EOMI.   Conjunctiva clear. Nose clear without congestion or rhinorrhea. Lungs: Clear to auscultation bilaterally. No wheezing, rales or rhonchi. CV: RRR without murmurs or rubs. Pulses 2+ bilaterally. Extremities: No edema noted. Psych: Alert and oriented. Normal mood and affect.  There are no preventive care reminders to display for this patient.  Lab Results    Latest Ref Rng & Units 11/25/2023    9:49 AM 06/26/2023   10:23 AM 06/25/2023   12:08 PM  CBC  WBC 4.0 - 10.5 K/uL 13.7  13.3  14.1   Hemoglobin 13.0 - 17.0 g/dL 09.8  11.9  14.7   Hematocrit 39.0 - 52.0 % 40.1  39.8  41.8   Platelets 150 - 400 K/uL 281  231  251        Latest Ref Rng & Units 11/25/2023    9:49 AM 06/26/2023   10:23 AM 06/25/2023   12:08 PM  CMP  Glucose 70 - 99 mg/dL 829  562  97   BUN 8 - 23 mg/dL 15  18  18     Creatinine 0.61 - 1.24 mg/dL 1.30  8.65  7.84   Sodium 135 - 145 mmol/L 142  138  137   Potassium 3.5 - 5.1 mmol/L 4.1  3.8  3.5   Chloride 98 - 111 mmol/L 109  103  101   CO2 22 - 32 mmol/L 22  22  23    Calcium 8.9 - 10.3 mg/dL 9.2  9.2  9.2   Total Protein 6.5 - 8.1 g/dL   7.1  Total Bilirubin 0.3 - 1.2 mg/dL   0.7   Alkaline Phos 38 - 126 U/L   84   AST 15 - 41 U/L   25   ALT 0 - 44 U/L   23       Assessment & Plan:   Problem List Items Addressed This Visit       Cardiovascular and Mediastinum   Chronic systolic heart failure (HCC) - Primary   Compensated. Weight is stable. Continue dapagliflozin 10 mg daily, furosemide 40 mg daily as needed, Entresto 24-26 mg bid, and metoprolol succinate 25 mg 1/2 tab daily. Continue midodrine 5 mg bid for hypotension. Recommend Mr. Brager increase physical activity during the day on Sat and Sun to see if this will reduce pacing at night.        Digestive   Constipation due to slow transit   Stable. Continue current regimen for maintaining regular bowel movements. and use of linactolide (Linzess)        Other   Intellectual disability (Chronic)   Creates some challenges regarding his adherence to medical advice, but overall Mr. Stockley is doing well.      Schizophrenia (HCC) (Chronic)   Stable. Continue to follow with by Dr. Jannifer Franklin (psychiatry) regarding current psych meds.       Return in about 3 months (around 03/24/2024) for Reassessment.   Loyola Mast, MD

## 2023-12-23 NOTE — Assessment & Plan Note (Signed)
Creates some challenges regarding his adherence to medical advice, but overall Jordan Kennedy is doing well. 

## 2023-12-23 NOTE — Assessment & Plan Note (Signed)
Stable. Continue to follow with by Dr. Jannifer Franklin (psychiatry) regarding current psych meds.

## 2023-12-31 ENCOUNTER — Other Ambulatory Visit (HOSPITAL_COMMUNITY): Payer: Self-pay

## 2024-01-18 ENCOUNTER — Other Ambulatory Visit: Payer: Self-pay | Admitting: Family Medicine

## 2024-01-18 DIAGNOSIS — K5901 Slow transit constipation: Secondary | ICD-10-CM

## 2024-01-23 ENCOUNTER — Other Ambulatory Visit: Payer: Self-pay | Admitting: Family Medicine

## 2024-01-23 DIAGNOSIS — K5901 Slow transit constipation: Secondary | ICD-10-CM

## 2024-02-04 ENCOUNTER — Other Ambulatory Visit: Payer: Self-pay | Admitting: Family Medicine

## 2024-02-04 DIAGNOSIS — I5021 Acute systolic (congestive) heart failure: Secondary | ICD-10-CM

## 2024-02-04 DIAGNOSIS — Z860100 Personal history of colon polyps, unspecified: Secondary | ICD-10-CM

## 2024-02-04 DIAGNOSIS — K5901 Slow transit constipation: Secondary | ICD-10-CM

## 2024-02-04 DIAGNOSIS — K219 Gastro-esophageal reflux disease without esophagitis: Secondary | ICD-10-CM

## 2024-02-04 DIAGNOSIS — B353 Tinea pedis: Secondary | ICD-10-CM

## 2024-02-04 NOTE — Telephone Encounter (Signed)
 Copied from CRM [id number]. Topic: Clinical - Medication Refill >> Feb 04, 2024  2:05 PM Martinique E wrote: Most Recent Primary Care Visit:  Provider: Graig Lawyer  Department: [facility]  Visit Type: OFFICE VISIT  Date: 12/23/2023  Medication: aspirin EC (GNP ASPIRIN) 81 MG tablet omeprazole (PRILOSEC) 40 MG capsule polyethylene glycol powder (GLYCOLAX/MIRALAX) 17 GM/SCOOP powder clotrimazole (LOTRIMIN) 1 % cream metoprolol succinate (TOPROL XL) 25 MG 24 hr tablet furosemide (LASIX) 40 MG tablet dapagliflozin propanediol (FARXIGA) 10 MG TABS tablet  Has the patient contacted their pharmacy? Yes, pharm technician Diego Foy) called in to get these medications refilled for the patient as he switched pharmacies. Will provide address for new pharmacy below. (Agent: If no, request that the patient contact the pharmacy for the refill. If patient does not wish to contact the pharmacy document the reason why and proceed with request.) (Agent: If yes, when and what did the pharmacy advise?)  Is this the correct pharmacy for this prescription? Yes If no, delete pharmacy and type the correct one.  This is the patient's preferred pharmacy:   [facility] [address] [place], [place] [address] Phone: [phone]  Has the prescription been filled recently? No  Is the patient out of the medication? Yes, Diego Foy stated that he is out of these medications.  Has the patient been seen for an appointment in the last year OR does the patient have an upcoming appointment? Yes  Can we respond through MyChart? Yes  Agent: Please be advised that Rx refills may take up to 3 business days. We ask that you follow-up with your pharmacy.

## 2024-02-05 NOTE — Telephone Encounter (Signed)
 This is not a patient  at [facility]. Sent to the wrong office.

## 2024-02-06 ENCOUNTER — Other Ambulatory Visit: Payer: Self-pay | Admitting: Family Medicine

## 2024-02-06 DIAGNOSIS — I5021 Acute systolic (congestive) heart failure: Secondary | ICD-10-CM

## 2024-02-06 DIAGNOSIS — Z860100 Personal history of colon polyps, unspecified: Secondary | ICD-10-CM

## 2024-02-06 DIAGNOSIS — K219 Gastro-esophageal reflux disease without esophagitis: Secondary | ICD-10-CM

## 2024-02-09 ENCOUNTER — Emergency Department (HOSPITAL_COMMUNITY)
Admission: EM | Admit: 2024-02-09 | Discharge: 2024-02-09 | Disposition: A | Discharge status: Home / Self Care | Attending: Emergency Medicine | Admitting: Emergency Medicine

## 2024-02-09 DIAGNOSIS — R4189 Other symptoms and signs involving cognitive functions and awareness: Secondary | ICD-10-CM | POA: Insufficient documentation

## 2024-02-09 DIAGNOSIS — Z7982 Long term (current) use of aspirin: Secondary | ICD-10-CM | POA: Diagnosis not present

## 2024-02-09 DIAGNOSIS — F79 Unspecified intellectual disabilities: Secondary | ICD-10-CM | POA: Diagnosis not present

## 2024-02-09 DIAGNOSIS — R4182 Altered mental status, unspecified: Secondary | ICD-10-CM | POA: Diagnosis present

## 2024-02-09 DIAGNOSIS — F819 Developmental disorder of scholastic skills, unspecified: Secondary | ICD-10-CM

## 2024-02-09 NOTE — ED Triage Notes (Signed)
 Per EMS. Pt found wondering and says he is from a group home but does not know which one.

## 2024-02-09 NOTE — ED Triage Notes (Signed)
 Pt BIBA from [facility]. Found wandering. Pt states they went for a walk from their group home and could not find their way back. Pt does not know address. Alert to self and place- hx of ID.  BP: 94/50 CBG- 177 SPO2: 100 RA

## 2024-02-09 NOTE — ED Provider Notes (Signed)
 [place] EMERGENCY DEPARTMENT AT [facility] Provider Note   CSN: [id number] Arrival date & time: 02/09/24  1303     History  Chief Complaint  Patient presents with   Altered Mental Status    Jordan Kennedy is a 68 y.o. male.  HPI Patient lives in a group home situation.  Patient's caregiver reports that they have gotten up in the morning and has some breakfast.  Thing was going normally.  They had talked about going out to the park and taking a walk later in the evening when it cooled down a bit.  He reports he was going out to pick up some lunch and the patient left the house.  Patient reports that he got lost and had to be picked up and was brought to the emergency department.  Patient reports that he just was out on a walk and lost his way.  He denies any other symptoms.  Caregiver reports that the patient has been well at baseline without other concerns for acute medical problems.    Home Medications Prior to Admission medications   Medication Sig Start Date End Date Taking? Authorizing Provider  amantadine  (SYMMETREL ) 100 MG capsule Take 100 mg by mouth at bedtime.     [provider]  aspirin  EC (ASPIRIN  EC ADULT LOW DOSE) 81 MG tablet TAKE 1 TABLET BY MOUTH EVERY DAY 02/07/24   Graig Lawyer, MD  clotrimazole  (LOTRIMIN ) 1 % cream Apply topically 2 (two) times daily. 09/23/23   Graig Lawyer, MD  cloZAPine  (CLOZARIL ) 100 MG tablet Take 2 tablets (200 mg total) by mouth 2 (two) times daily. 03/12/17   Armenta Landau, MD  dapagliflozin  propanediol (FARXIGA ) 10 MG TABS tablet TAKE 1 TABLET BY MOUTH EVERY DAY 02/07/24   Graig Lawyer, MD  furosemide  (LASIX ) 40 MG tablet Take 1 tablet (40 mg total) by mouth as needed. 02/13/23   Bridgette Campus, MD  LINZESS  290 MCG CAPS capsule TAKE ONE CAPSULE DAILY BEFORE BREAKFAST 01/20/24   Graig Lawyer, MD  Melatonin 10 MG SUBL Place 10 mg under the tongue at bedtime.    [provider]  metoCLOPramide   (REGLAN ) 5 MG tablet TAKE ONE TABLET BY MOUTH TWICE DAILY 01/23/24   Graig Lawyer, MD  metoprolol  succinate (TOPROL -XL) 25 MG 24 hr tablet TAKE 1/2 TABLET BY MOUTH EVERY DAY 02/07/24   Graig Lawyer, MD  midodrine  (PROAMATINE ) 5 MG tablet Take 1 tablet (5 mg total) by mouth 2 (two) times daily with a meal. 12/04/23   Tobb, Kardie, DO  MYRBETRIQ  50 MG TB24 tablet Take 50 mg by mouth daily. 01/06/21   [provider]  omeprazole  (PRILOSEC) 40 MG capsule TAKE 1 CAPSULE BY MOUTH EVERY EVENING 02/07/24   Graig Lawyer, MD  PARoxetine  (PAXIL ) 40 MG tablet Take 40 mg by mouth every evening.     [provider]  polyethylene glycol powder (GLYCOLAX /MIRALAX ) 17 GM/SCOOP powder Take 17 g by mouth See admin instructions. 17 g twice weekly on Monday and Thursday. 01/11/23   Graig Lawyer, MD  sacubitril -valsartan  (ENTRESTO ) 24-26 MG TAKE 1 TABLET BY MOUTH 2 TIMES DAILY 02/07/24   Graig Lawyer, MD  senna-docusate (SENNA PLUS) 8.6-50 MG tablet Take 1 tablet by mouth 2 (two) times daily. 03/19/23   Graig Lawyer, MD  tamsulosin  (FLOMAX ) 0.4 MG CAPS capsule Take 0.4 mg by mouth daily. 01/10/21   [provider]  zolpidem  (AMBIEN ) 10 MG tablet Take  1 tablet (10 mg total) by mouth at bedtime. 06/19/23   Graig Lawyer, MD      Allergies    Patient has no known allergies.    Review of Systems   Review of Systems  Physical Exam Updated Vital Signs BP (!) 115/91   Pulse 94   Temp 99 F (37.2 C)   Resp 17   SpO2 100%  Physical Exam Constitutional:      Comments: Alert nontoxic cooperative  HENT:     Head: Normocephalic and atraumatic.     Mouth/Throat:     Pharynx: Oropharynx is clear.  Eyes:     Extraocular Movements: Extraocular movements intact.  Cardiovascular:     Rate and Rhythm: Normal rate and regular rhythm.  Pulmonary:     Effort: Pulmonary effort is normal.     Breath sounds: Normal breath sounds.  Abdominal:     General: There is no distension.      Palpations: Abdomen is soft.     Tenderness: There is no abdominal tenderness. There is no guarding.  Musculoskeletal:        General: No swelling, deformity or signs of injury. Normal range of motion.     Right lower leg: No edema.     Left lower leg: No edema.  Skin:    General: Skin is warm and dry.  Neurological:     General: No focal deficit present.     Comments: Patient is alert and interactive.  Speech reflects some developmental delay.  However conversation is situationally appropriate.  No focal neurologic deficits.  Psychiatric:        Mood and Affect: Mood normal.     ED Results / Procedures / Treatments   Labs (all labs ordered are listed, but only abnormal results are displayed) Labs Reviewed - No data to display  EKG EKG Interpretation Date/Time:  Sunday February 09 2024 13:31:35 EDT Ventricular Rate:  93 PR Interval:  149 QRS Duration:  168 QT Interval:  438 QTC Calculation: 545 R Axis:   15  Text Interpretation: Sinus rhythm Ventricular premature complex Probable left atrial enlargement Left bundle branch block no sig change from previous Confirmed by Wynetta Heckle [phone]) on 02/09/2024 1:35:47 PM  Radiology No results found.  Procedures Procedures    Medications Ordered in ED Medications - No data to display  ED Course/ Medical Decision Making/ A&P                                 Medical Decision Making  Presents as outlined.  There are no immediate concerns for medical illness or injury.  Denies any pain.  He reports he was walking and got lost.  Caregiver confirms this.  Patient is discharged in good condition.  He has been drinking and eating snacks without difficulty remained cheerful and directable.        Final Clinical Impression(s) / ED Diagnoses Final diagnoses:  Gets lost in familiar location  Intellectual delay    Rx / DC Orders ED Discharge Orders     None         Wynetta Heckle, MD 02/09/24 1413

## 2024-02-09 NOTE — Discharge Instructions (Signed)
 1.  Follow-up with your doctor as planned. 2.  Return to the emergency department if there are any concerning or changing, unexpected symptoms.

## 2024-02-10 ENCOUNTER — Telehealth: Payer: Self-pay

## 2024-02-10 NOTE — Telephone Encounter (Signed)
 Called and spoke to [employer].  She states that this has been resolved and they Myrbetric and Tamsulosin  come from his urologist.  Dm/cma

## 2024-02-10 NOTE — Telephone Encounter (Signed)
 Copied from CRM [id number]. Topic: Clinical - Prescription Issue >> Feb 07, 2024 11:50 AM Earnestine Goes B wrote: Reason for CRM: [facility] to [facility] prescription for all pt medication to be sent over. omeprazole  (PRILOSEC) 40 MG capsule, clotrimazole  (LOTRIMIN ) 1 % cream , furosemide  (LASIX ) 40 MG tablet , sacubitril -valsartan  (ENTRESTO ) 24-26 MG, dapagliflozin  propanediol (FARXIGA ) 10 MG TABS tablet. New prescription for all medication. Also needs clarification on medication MYRBETRIQ  50 MG TB24 tablet,  tamsulosin  (FLOMAX ) 0.4 MG CAPS capsule needs to know what time should the pt be taking the medication. Please is in longterm care and needs clarity.please call [phone], [facility]

## 2024-02-10 NOTE — Transitions of Care (Post Inpatient/ED Visit) (Unsigned)
   02/10/2024  Name: Jerret Mcbane MRN: [id number] DOB: 05-11-1956  Today's TOC FU Call Status: Today's TOC FU Call Status:: Unsuccessful Call (1st Attempt) Unsuccessful Call (1st Attempt) Date: 02/10/24  Attempted to reach the patient regarding the most recent Inpatient/ED visit.  Follow Up Plan: Additional outreach attempts will be made to reach the patient to complete the Transitions of Care (Post Inpatient/ED visit) call.   Signature   Germain Kohler, CMA (AAMA)  [facility] [phone]

## 2024-02-25 ENCOUNTER — Ambulatory Visit (INDEPENDENT_AMBULATORY_CARE_PROVIDER_SITE_OTHER): Admitting: Podiatry

## 2024-02-25 DIAGNOSIS — L603 Nail dystrophy: Secondary | ICD-10-CM | POA: Diagnosis not present

## 2024-02-25 DIAGNOSIS — I739 Peripheral vascular disease, unspecified: Secondary | ICD-10-CM

## 2024-02-25 NOTE — Progress Notes (Signed)
 Patient presents for evaluation and treatment of tenderness around nails feet.   Physical exam:  General appearance: Alert, pleasant, and in no acute distress.  Vascular: Pedal pulses: DP diminished bilaterally, PT nonpalpable bilaterally.  Moderate edema lower legs bilaterally  Neurological:  No paresthesias or burning noted.  Dermatologic:  Nails thickened and dystrophic 1-5 BL. Skin thinned.  Decreased hair lower limbs BL.  Skin hyperpigmentation lower legs BL.  Musculoskeletal: Hammertoes 2 through 5 bilaterally   Diagnosis: Dystrophic nails 1-5 BL. PAD/PVD  Q 8  Plan: Debrided dystrophic thickened nails 1 through 5 bilaterally  Return 3 months

## 2024-03-04 ENCOUNTER — Other Ambulatory Visit: Payer: Self-pay | Admitting: Family Medicine

## 2024-03-04 DIAGNOSIS — K5901 Slow transit constipation: Secondary | ICD-10-CM

## 2024-03-19 ENCOUNTER — Other Ambulatory Visit: Payer: Self-pay | Admitting: Family Medicine

## 2024-03-19 DIAGNOSIS — K5901 Slow transit constipation: Secondary | ICD-10-CM

## 2024-03-24 ENCOUNTER — Ambulatory Visit (INDEPENDENT_AMBULATORY_CARE_PROVIDER_SITE_OTHER): Admitting: Family Medicine

## 2024-03-24 ENCOUNTER — Encounter: Payer: Self-pay | Admitting: Family Medicine

## 2024-03-24 VITALS — BP 104/66 | HR 96 | Temp 97.6°F | Ht 74.0 in | Wt 191.4 lb

## 2024-03-24 DIAGNOSIS — F79 Unspecified intellectual disabilities: Secondary | ICD-10-CM | POA: Diagnosis not present

## 2024-03-24 DIAGNOSIS — R32 Unspecified urinary incontinence: Secondary | ICD-10-CM

## 2024-03-24 DIAGNOSIS — F209 Schizophrenia, unspecified: Secondary | ICD-10-CM

## 2024-03-24 DIAGNOSIS — I5022 Chronic systolic (congestive) heart failure: Secondary | ICD-10-CM

## 2024-03-24 MED ORDER — FUROSEMIDE 40 MG PO TABS
40.0000 mg | ORAL_TABLET | ORAL | 3 refills | Status: DC | PRN
Start: 2024-03-24 — End: 2024-06-17

## 2024-03-24 NOTE — Assessment & Plan Note (Signed)
Creates some challenges regarding his adherence to medical advice, but overall Jordan Kennedy is doing well. 

## 2024-03-24 NOTE — Assessment & Plan Note (Signed)
 I will prescribe adult diapers. Continue mirabegron  50 mg daily.

## 2024-03-24 NOTE — Assessment & Plan Note (Signed)
 Compensated. Weight is increased about 5 lbs, but there is no sign of fluid retention. Continue dapagliflozin  10 mg daily, furosemide  40 mg daily as needed, Entresto  24-26 mg bid, and metoprolol  succinate 25 mg 1/2 tab daily. Continue midodrine  5 mg bid for hypotension. Next cardiology appointment is in August.

## 2024-03-24 NOTE — Progress Notes (Signed)
 [facility] [address] [place] [address] Dept: [phone] Dept Fax: [phone]  Chronic Care Office Visit  Subjective:    Patient ID: Jordan Kennedy, male    DOB: Jan 14, 1956, 68 y.o..   MRN: [id number]  Chief Complaint  Patient presents with   Hypertension    3 month f/u HTN.  Would like to get an order for pull-ups at night.    History of Present Illness:  Patient is in today for reassessment of chronic medical issues. Jordan Kennedy has moved since his last visit from the group home. He is now living with Jordan Kennedy and his wife. He continues to attend an adult day program.    Jordan Kennedy has a history of schizophrenia, anxiety with depression, and intellectual disability. He is currently followed by Jordan Kennedy (psychiatry). He is managed on clozapine , paroxetine , and amantadine .     Jordan Kennedy has a history of chronic HFrEF. He is managed on dapagliflozin  10 mg daily, furosemide  40 mg daily as needed, Entresto  24-26 mg bid, metoprolol  succinate 12.5 mg daily. He has not been seeing any swelling to his lower legs. Jordan Kennedy does note that Jordan Kennedy has some dyspnea on exertion at times. He has had some hypotension, but is managing well off of spironolactone  and taking midodrine  5 mg bid. Jordan Kennedy cautions Jordan Kennedy about getting up form bed too abruptly and this seems to help.   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   Tinea pedis of both feet 01/11/2023   Chronic systolic heart failure (HCC) 01/03/2023   Prolonged QT interval 01/01/2023   Transaminitis 01/01/2023   Thrombocytosis 01/01/2023   Tardive dyskinesia 11/20/2021   Lower urinary tract symptoms (LUTS) 05/02/2021   History of small bowel obstruction 01/31/2021   History of colon  polyps 01/31/2021   Onychomycosis 01/31/2021   Gastroesophageal reflux disease 01/31/2021   Tobacco use disorder, mild, in sustained remission 02/01/2015   Constipation due to slow transit 04/25/2012   Anxiety and depression 04/25/2012   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 No pertinent surgical history. Family History  Problem Relation Age of Onset   Cancer Mother        unknown type   Outpatient Medications Prior to Visit  Medication Sig Dispense Refill   amantadine  (SYMMETREL ) 100 MG capsule Take 100 mg by mouth at bedtime.      aspirin  EC (ASPIRIN  EC ADULT LOW DOSE) 81 MG tablet TAKE 1 TABLET BY MOUTH EVERY DAY 90 tablet 3   clotrimazole  (LOTRIMIN ) 1 % cream Apply topically 2 (two) times daily. 30 g 5   cloZAPine  (CLOZARIL ) 100 MG tablet Take 2 tablets (200 mg total) by mouth 2 (two) times daily. 120 tablet 0   dapagliflozin  propanediol (FARXIGA ) 10 MG TABS tablet TAKE 1 TABLET BY MOUTH EVERY DAY 90 tablet 3   LINZESS  290 MCG CAPS capsule TAKE ONE CAPSULE DAILY BEFORE BREAKFAST 30 capsule 5   Melatonin 10 MG SUBL Place 10 mg under the tongue at bedtime.     metoCLOPramide  (REGLAN ) 5 MG tablet TAKE 1 TABLET BY MOUTH 2 TIMES DAILY 60 tablet 2   metoprolol  succinate (TOPROL -XL) 25 MG 24 hr tablet TAKE 1/2 TABLET BY MOUTH EVERY DAY 90 tablet  3   midodrine  (PROAMATINE ) 5 MG tablet Take 1 tablet (5 mg total) by mouth 2 (two) times daily with a meal. 180 tablet 3   MYRBETRIQ  50 MG TB24 tablet Take 50 mg by mouth daily.     omeprazole  (PRILOSEC) 40 MG capsule TAKE 1 CAPSULE BY MOUTH EVERY EVENING 90 capsule 3   PARoxetine  (PAXIL ) 40 MG tablet Take 40 mg by mouth every evening.      polyethylene glycol powder (GLYCOLAX /MIRALAX ) 17 GM/SCOOP powder Take 17 g by mouth See admin instructions. 17 g twice weekly on Monday and Thursday. 510 g 11   sacubitril -valsartan  (ENTRESTO ) 24-26 MG TAKE 1 TABLET BY MOUTH 2 TIMES DAILY 180 tablet 3    SENNA PLUS 8.6-50 MG tablet TAKE 1 TABLET BY MOUTH 2 TIMES DAILY 180 tablet 1   tamsulosin  (FLOMAX ) 0.4 MG CAPS capsule Take 0.4 mg by mouth daily.     zolpidem  (AMBIEN ) 10 MG tablet Take 1 tablet (10 mg total) by mouth at bedtime. 30 tablet 5   furosemide  (LASIX ) 40 MG tablet Take 1 tablet (40 mg total) by mouth as needed. 45 tablet 3   No facility-administered medications prior to visit.   No Known Allergies Objective:   Today's Vitals   03/24/24 0906  BP: 104/66  Pulse: 96  Temp: 97.6 F (36.4 C)  TempSrc: Temporal  SpO2: 98%  Weight: 191 lb 6.4 oz (86.8 kg)  Height: 6\' 2"  (1.88 m)   Body mass index is 24.57 kg/m.   General: Well developed, well nourished. No acute distress. CV: RRR without murmurs or rubs. Pulses 2+ bilaterally. Extremities: No edema noted. Psych: Alert and oriented. Normal mood and affect.  Health Maintenance Due  Topic Date Due   Zoster Vaccines- Shingrix (1 of 2) Never done   Assessment & Plan:   Problem List Items Addressed This Visit       Cardiovascular and Mediastinum   Chronic systolic heart failure (HCC) - Primary   Compensated. Weight is increased about 5 lbs, but there is no sign of fluid retention. Continue dapagliflozin  10 mg daily, furosemide  40 mg daily as needed, Entresto  24-26 mg bid, and metoprolol  succinate 25 mg 1/2 tab daily. Continue midodrine  5 mg bid for hypotension. Next cardiology appointment is in August.      Relevant Medications   furosemide  (LASIX ) 40 MG tablet     Other   Intellectual disability (Chronic)   Creates some challenges regarding his adherence to medical advice, but overall Jordan Kennedy is doing well.      Schizophrenia (HCC) (Chronic)   Stable. Continue to follow with by Jordan Kennedy (psychiatry) regarding current psych meds.      Enuresis   I will prescribe adult diapers. Continue mirabegron  50 mg daily.      Relevant Orders   For home use only DME Other see comment    Return in about 3 months  (around 06/24/2024) for Reassessment.   Jordan Lawyer, MD

## 2024-03-24 NOTE — Assessment & Plan Note (Signed)
Stable. Continue to follow with by Dr. Jannifer Franklin (psychiatry) regarding current psych meds.

## 2024-05-14 ENCOUNTER — Other Ambulatory Visit: Payer: Self-pay | Admitting: Family Medicine

## 2024-05-14 ENCOUNTER — Ambulatory Visit: Payer: Self-pay

## 2024-05-14 DIAGNOSIS — K5901 Slow transit constipation: Secondary | ICD-10-CM

## 2024-05-14 MED ORDER — POLYETHYLENE GLYCOL 3350 17 GM/SCOOP PO POWD: 17.0000 g | ORAL | 11 refills | Status: DC

## 2024-05-14 NOTE — Telephone Encounter (Signed)
 Requesting: polyethylene glycol powder (GLYCOLAX /MIRALAX ) 17 GM/SCOOP powder  Last Visit: 03/24/2024 Next Visit: 06/25/2024 Last Refill: 01/11/2023  Please Advise

## 2024-05-14 NOTE — Telephone Encounter (Signed)
 Copied from CRM [id number]. Topic: Clinical - Medication Refill >> May 14, 2024  8:15 AM Deaijah H wrote: Medication: polyethylene glycol powder (GLYCOLAX /MIRALAX ) 17 GM/SCOOP powder  Has the patient contacted their pharmacy? No (Agent: If no, request that the patient contact the pharmacy for the refill. If patient does not wish to contact the pharmacy document the reason why and proceed with request.) (Agent: If yes, when and what did the pharmacy advise?)  This is the patient's preferred pharmacy:   [facility] [place], [place] - [address] [place] [address] Phone: [phone] Fax: [phone]  Is this the correct pharmacy for this prescription? Yes If no, delete pharmacy and type the correct one.   Has the prescription been filled recently? No  Is the patient out of the medication? Yes  Has the patient been seen for an appointment in the last year OR does the patient have an upcoming appointment? Yes  Can we respond through MyChart? No  Agent: Please be advised that Rx refills may take up to 3 business days. We ask that you follow-up with your pharmacy.

## 2024-05-14 NOTE — Telephone Encounter (Signed)
 FYI Only or Action Required?: FYI only for provider.  Patient was last seen in primary care on 03/24/2024 by [name] HERO, MD.  Called Nurse Triage reporting Fatigue.  Symptoms began several days ago.  Interventions attempted: Rest, hydration, or home remedies.  Symptoms are: gradually worsening. No BM in 48 hours and no appetite  Triage Disposition: See Physician Within 24 Hours  Patient/caregiver understands and will follow disposition?: Yes Copied from CRM #[id number]. Topic: Clinical - Red Word Triage >> May 14, 2024 11:02 AM Roxie KIDD wrote: Reason for CRM: pt was scheduled without triage for Feeling weak (stumbling when walk) / loss of appetite Reason for Disposition  [1] MODERATE weakness (e.g., interferes with work, school, normal activities) AND [2] persists > 3 days  Answer Assessment - Initial Assessment Questions 1. DESCRIPTION: Describe how you are feeling.     Feeling weak  2. SEVERITY: How bad is it?  Can you stand and walk?     Standing and walking, no assists   3. ONSET: When did these symptoms begin? (e.g., hours, days, weeks, months)     Couple of days  4. CAUSE: What do you think is causing the weakness or fatigue? (e.g., not drinking enough fluids, medical problem, trouble sleeping)     Unsure of cause, per caregiver patient  5. NEW MEDICINES:  Have you started on any new medicines recently? (e.g., opioid pain medicines, benzodiazepines, muscle relaxants, antidepressants, antihistamines, neuroleptics, beta blockers)     No new medication  6. OTHER SYMPTOMS: Do you have any other symptoms? (e.g., chest pain, fever, cough, SOB, vomiting, diarrhea, bleeding, other areas of pain)     Loss of appetite, no BM for last 2 days  Protocols used: Weakness (Generalized) and Fatigue-A-AH

## 2024-05-15 ENCOUNTER — Encounter: Payer: Self-pay | Admitting: Family Medicine

## 2024-05-15 ENCOUNTER — Inpatient Hospital Stay (HOSPITAL_COMMUNITY)

## 2024-05-15 ENCOUNTER — Encounter (HOSPITAL_COMMUNITY): Payer: Self-pay

## 2024-05-15 ENCOUNTER — Emergency Department (HOSPITAL_COMMUNITY)

## 2024-05-15 ENCOUNTER — Inpatient Hospital Stay (HOSPITAL_COMMUNITY)
Admission: EM | Admit: 2024-05-15 | Discharge: 2024-05-19 | DRG: 699 | Disposition: A | Discharge status: Home Health | Attending: Internal Medicine | Admitting: Internal Medicine

## 2024-05-15 ENCOUNTER — Ambulatory Visit (INDEPENDENT_AMBULATORY_CARE_PROVIDER_SITE_OTHER): Admitting: Family Medicine

## 2024-05-15 ENCOUNTER — Other Ambulatory Visit: Payer: Self-pay

## 2024-05-15 VITALS — BP 86/60 | HR 91 | Temp 97.1°F | Ht 74.0 in | Wt 219.6 lb

## 2024-05-15 DIAGNOSIS — Z7982 Long term (current) use of aspirin: Secondary | ICD-10-CM | POA: Diagnosis not present

## 2024-05-15 DIAGNOSIS — N320 Bladder-neck obstruction: Secondary | ICD-10-CM | POA: Diagnosis present

## 2024-05-15 DIAGNOSIS — R079 Chest pain, unspecified: Secondary | ICD-10-CM

## 2024-05-15 DIAGNOSIS — N138 Other obstructive and reflux uropathy: Secondary | ICD-10-CM | POA: Diagnosis present

## 2024-05-15 DIAGNOSIS — N401 Enlarged prostate with lower urinary tract symptoms: Secondary | ICD-10-CM | POA: Diagnosis present

## 2024-05-15 DIAGNOSIS — F79 Unspecified intellectual disabilities: Secondary | ICD-10-CM | POA: Diagnosis present

## 2024-05-15 DIAGNOSIS — N179 Acute kidney failure, unspecified: Secondary | ICD-10-CM | POA: Diagnosis present

## 2024-05-15 DIAGNOSIS — K5901 Slow transit constipation: Secondary | ICD-10-CM | POA: Diagnosis not present

## 2024-05-15 DIAGNOSIS — N1330 Unspecified hydronephrosis: Secondary | ICD-10-CM | POA: Diagnosis present

## 2024-05-15 DIAGNOSIS — I5022 Chronic systolic (congestive) heart failure: Secondary | ICD-10-CM | POA: Diagnosis present

## 2024-05-15 DIAGNOSIS — F209 Schizophrenia, unspecified: Secondary | ICD-10-CM | POA: Diagnosis present

## 2024-05-15 DIAGNOSIS — F17210 Nicotine dependence, cigarettes, uncomplicated: Secondary | ICD-10-CM | POA: Diagnosis present

## 2024-05-15 DIAGNOSIS — N3281 Overactive bladder: Secondary | ICD-10-CM | POA: Diagnosis present

## 2024-05-15 DIAGNOSIS — Z1152 Encounter for screening for COVID-19: Secondary | ICD-10-CM

## 2024-05-15 DIAGNOSIS — I959 Hypotension, unspecified: Secondary | ICD-10-CM

## 2024-05-15 DIAGNOSIS — R0902 Hypoxemia: Secondary | ICD-10-CM

## 2024-05-15 DIAGNOSIS — K56609 Unspecified intestinal obstruction, unspecified as to partial versus complete obstruction: Secondary | ICD-10-CM

## 2024-05-15 DIAGNOSIS — Z79899 Other long term (current) drug therapy: Secondary | ICD-10-CM | POA: Diagnosis not present

## 2024-05-15 DIAGNOSIS — R109 Unspecified abdominal pain: Principal | ICD-10-CM

## 2024-05-15 DIAGNOSIS — Z9079 Acquired absence of other genital organ(s): Secondary | ICD-10-CM

## 2024-05-15 DIAGNOSIS — K56600 Partial intestinal obstruction, unspecified as to cause: Secondary | ICD-10-CM | POA: Diagnosis present

## 2024-05-15 LAB — URINALYSIS, W/ REFLEX TO CULTURE (INFECTION SUSPECTED)
Bilirubin Urine: NEGATIVE
Glucose, UA: >=500 mg/dL — AB
Ketones, ur: NEGATIVE mg/dL
Nitrite: NEGATIVE
Protein, ur: NEGATIVE mg/dL
Specific Gravity, Urine: 1.012 (ref 1.005–1.030)
pH: 5 (ref 5.0–8.0)

## 2024-05-15 LAB — I-STAT CG4 LACTIC ACID, ED
Lactic Acid, Venous: 1.1 mmol/L (ref 0.5–1.9)
Lactic Acid, Venous: 1.2 mmol/L (ref 0.5–1.9)

## 2024-05-15 LAB — I-STAT CHEM 8, ED
BUN: 81 mg/dL — ABNORMAL HIGH (ref 8–23)
Calcium, Ion: 1.08 mmol/L — ABNORMAL LOW (ref 1.15–1.40)
Chloride: 108 mmol/L (ref 98–111)
Creatinine, Ser: 6.7 mg/dL — ABNORMAL HIGH (ref 0.61–1.24)
Glucose, Bld: 110 mg/dL — ABNORMAL HIGH (ref 70–99)
HCT: 34 % — ABNORMAL LOW (ref 39.0–52.0)
Hemoglobin: 11.6 g/dL — ABNORMAL LOW (ref 13.0–17.0)
Potassium: 4.7 mmol/L (ref 3.5–5.1)
Sodium: 140 mmol/L (ref 135–145)
TCO2: 20 mmol/L — ABNORMAL LOW (ref 22–32)

## 2024-05-15 LAB — COMPREHENSIVE METABOLIC PANEL WITH GFR
ALT: 56 U/L — ABNORMAL HIGH (ref 0–44)
AST: 29 U/L (ref 15–41)
Albumin: 2 g/dL — ABNORMAL LOW (ref 3.5–5.0)
Alkaline Phosphatase: 101 U/L (ref 38–126)
Anion gap: 11 (ref 5–15)
BUN: 93 mg/dL — ABNORMAL HIGH (ref 8–23)
CO2: 21 mmol/L — ABNORMAL LOW (ref 22–32)
Calcium: 8.1 mg/dL — ABNORMAL LOW (ref 8.9–10.3)
Chloride: 105 mmol/L (ref 98–111)
Creatinine, Ser: 6.63 mg/dL — ABNORMAL HIGH (ref 0.61–1.24)
GFR, Estimated: 9 mL/min — ABNORMAL LOW (ref >60)
Glucose, Bld: 116 mg/dL — ABNORMAL HIGH (ref 70–99)
Potassium: 4.6 mmol/L (ref 3.5–5.1)
Sodium: 137 mmol/L (ref 135–145)
Total Bilirubin: 0.9 mg/dL (ref 0.0–1.2)
Total Protein: 5.2 g/dL — ABNORMAL LOW (ref 6.5–8.1)

## 2024-05-15 LAB — LIPASE, BLOOD: Lipase: 34 U/L (ref 11–51)

## 2024-05-15 LAB — RESP PANEL BY RT-PCR (RSV, FLU A&B, COVID)  RVPGX2
Influenza A by PCR: NEGATIVE
Influenza B by PCR: NEGATIVE
Resp Syncytial Virus by PCR: NEGATIVE
SARS Coronavirus 2 by RT PCR: NEGATIVE

## 2024-05-15 LAB — CBC
HCT: 34 % — ABNORMAL LOW (ref 39.0–52.0)
Hemoglobin: 11.3 g/dL — ABNORMAL LOW (ref 13.0–17.0)
MCH: 31.4 pg (ref 26.0–34.0)
MCHC: 33.2 g/dL (ref 30.0–36.0)
MCV: 94.4 fL (ref 80.0–100.0)
Platelets: 400 K/uL (ref 150–400)
RBC: 3.6 MIL/uL — ABNORMAL LOW (ref 4.22–5.81)
RDW: 17.1 % — ABNORMAL HIGH (ref 11.5–15.5)
WBC: 27.7 K/uL — ABNORMAL HIGH (ref 4.0–10.5)
nRBC: 0 % (ref 0.0–0.2)

## 2024-05-15 LAB — CBG MONITORING, ED: Glucose-Capillary: 122 mg/dL — ABNORMAL HIGH (ref 70–99)

## 2024-05-15 LAB — PROTIME-INR
INR: 1.3 — ABNORMAL HIGH (ref 0.8–1.2)
Prothrombin Time: 16.6 s — ABNORMAL HIGH (ref 11.4–15.2)

## 2024-05-15 LAB — PSA: Prostatic Specific Antigen: 11.15 ng/mL — ABNORMAL HIGH (ref 0.00–4.00)

## 2024-05-15 LAB — TROPONIN I (HIGH SENSITIVITY)
Troponin I (High Sensitivity): 45 ng/L — ABNORMAL HIGH (ref <18)
Troponin I (High Sensitivity): 40 ng/L — ABNORMAL HIGH (ref <18)

## 2024-05-15 LAB — HIV ANTIBODY (ROUTINE TESTING W REFLEX): HIV Screen 4th Generation wRfx: NONREACTIVE

## 2024-05-15 MED ORDER — LACTATED RINGERS IV BOLUS (SEPSIS): 1000.0000 mL | Freq: Once | INTRAVENOUS | Status: DC

## 2024-05-15 MED ORDER — DIATRIZOATE MEGLUMINE & SODIUM 66-10 % PO SOLN: 90.0000 mL | Freq: Once | ORAL | Status: DC

## 2024-05-15 MED ORDER — VANCOMYCIN HCL IN DEXTROSE 1-5 GM/200ML-% IV SOLN: 1000.0000 mg | Freq: Once | INTRAVENOUS | Status: DC

## 2024-05-15 MED ORDER — AMANTADINE HCL 100 MG PO CAPS
100.0000 mg | ORAL_CAPSULE | Freq: Every day | ORAL | Status: DC
  Administered 2024-05-16 – 2024-05-18 (×3): 100 mg via ORAL
  Filled 2024-05-15 (×5): qty 1

## 2024-05-15 MED ORDER — METOPROLOL SUCCINATE ER 25 MG PO TB24
12.5000 mg | ORAL_TABLET | Freq: Every day | ORAL | Status: DC
  Administered 2024-05-16 – 2024-05-19 (×4): 12.5 mg via ORAL
  Filled 2024-05-15 (×4): qty 1

## 2024-05-15 MED ORDER — SODIUM CHLORIDE 0.9 % IV SOLN
2.0000 g | Freq: Once | INTRAVENOUS | Status: AC
  Administered 2024-05-15: 2 g via INTRAVENOUS
  Filled 2024-05-15: qty 12.5

## 2024-05-15 MED ORDER — DIATRIZOATE MEGLUMINE & SODIUM 66-10 % PO SOLN
90.0000 mL | Freq: Once | ORAL | Status: AC
  Administered 2024-05-16: 90 mL via NASOGASTRIC
  Filled 2024-05-15 (×2): qty 90

## 2024-05-15 MED ORDER — LINACLOTIDE 145 MCG PO CAPS
290.0000 ug | ORAL_CAPSULE | Freq: Every day | ORAL | Status: DC
  Administered 2024-05-16 – 2024-05-19 (×4): 290 ug via ORAL
  Filled 2024-05-15 (×5): qty 2

## 2024-05-15 MED ORDER — MELATONIN 5 MG PO TABS
10.0000 mg | ORAL_TABLET | Freq: Every day | ORAL | Status: DC
  Administered 2024-05-16 – 2024-05-18 (×3): 10 mg via ORAL
  Filled 2024-05-15 (×3): qty 2

## 2024-05-15 MED ORDER — LACTATED RINGERS IV SOLN: INTRAVENOUS | Status: AC

## 2024-05-15 MED ORDER — METOCLOPRAMIDE HCL 5 MG/ML IJ SOLN
10.0000 mg | Freq: Once | INTRAMUSCULAR | Status: AC
  Administered 2024-05-15: 10 mg via INTRAVENOUS
  Filled 2024-05-15: qty 2

## 2024-05-15 MED ORDER — VANCOMYCIN HCL 2000 MG/400ML IV SOLN
2000.0000 mg | Freq: Once | INTRAVENOUS | Status: AC
  Administered 2024-05-15: 2000 mg via INTRAVENOUS
  Filled 2024-05-15: qty 400

## 2024-05-15 MED ORDER — TAMSULOSIN HCL 0.4 MG PO CAPS
0.4000 mg | ORAL_CAPSULE | Freq: Every evening | ORAL | Status: DC
  Administered 2024-05-16 – 2024-05-18 (×3): 0.4 mg via ORAL
  Filled 2024-05-15 (×3): qty 1

## 2024-05-15 MED ORDER — CLOZAPINE 100 MG PO TABS
200.0000 mg | ORAL_TABLET | Freq: Every day | ORAL | Status: DC
  Administered 2024-05-16 – 2024-05-18 (×3): 200 mg via ORAL
  Filled 2024-05-15 (×5): qty 2

## 2024-05-15 MED ORDER — SODIUM CHLORIDE 0.9 % IV BOLUS
1000.0000 mL | Freq: Once | INTRAVENOUS | Status: AC
  Administered 2024-05-15: 1000 mL via INTRAVENOUS

## 2024-05-15 MED ORDER — POLYETHYLENE GLYCOL 3350 17 G PO PACK
17.0000 g | PACK | Freq: Two times a day (BID) | ORAL | Status: DC
  Administered 2024-05-16 – 2024-05-18 (×4): 17 g via ORAL
  Filled 2024-05-15 (×4): qty 1

## 2024-05-15 MED ORDER — METRONIDAZOLE 500 MG/100ML IV SOLN
500.0000 mg | Freq: Once | INTRAVENOUS | Status: AC
  Administered 2024-05-15: 500 mg via INTRAVENOUS
  Filled 2024-05-15: qty 100

## 2024-05-15 MED ORDER — SENNOSIDES-DOCUSATE SODIUM 8.6-50 MG PO TABS
2.0000 | ORAL_TABLET | Freq: Two times a day (BID) | ORAL | Status: DC
  Administered 2024-05-16 – 2024-05-18 (×4): 2 via ORAL
  Filled 2024-05-15 (×5): qty 2

## 2024-05-15 MED ORDER — ONDANSETRON HCL 4 MG/2ML IJ SOLN
4.0000 mg | Freq: Once | INTRAMUSCULAR | Status: AC
  Administered 2024-05-15: 4 mg via INTRAVENOUS
  Filled 2024-05-15: qty 2

## 2024-05-15 MED ORDER — MELATONIN 10 MG SL SUBL: 10.0000 mg | SUBLINGUAL_TABLET | Freq: Every day | SUBLINGUAL | Status: DC

## 2024-05-15 NOTE — H&P (Addendum)
 History and Physical    Patient: Jordan Kennedy FMW:[id number] DOB: 04/20/1956 DOA: 05/15/2024 DOS: the patient was seen and examined on 05/15/2024 PCP: [name] HERO, MD  Patient coming from: Home  Chief Complaint:  Chief Complaint  Patient presents with   Abdominal Pain    Pt complaining of generalized weakness and chest pain   HPI: Jordan Kennedy is a 68 y.o. male with medical history significant of schizophrenia (Monarche q53m), SBO, BPH, AKI p/w AKI 2/2 BOO c/b L hydroureteronephrosis.  Pt is a poor historian. From what I can gather from limited interview and Epic chart review, pt was in [facility] until having abd discomfort his morning when he woke up and activated EMS. Denies n/v.   In the ED, pt hypotensive and tachypneic on RA. Labs notable for Cr 6.7, WBC 27.2, and lactate 1.1. CT abd/pelvis showed  left hydroureteronephrosis with fairly extensive left perinephric  edema/inflammation/hemorrhage w/ L hydroureteronephrosis and prostatomegaly. EDP placed foley, started broad spectrum abx (IV cefepime /vanc), consulted urology, and requested medicine admission.  Review of Systems: As mentioned in the history of present illness. All other systems reviewed and are negative. Past Medical History:  Diagnosis Date   AKI (acute kidney injury) (HCC) 06/29/2018   Anemia    Anemia, iron deficiency 09/18/2015   Bowel obstruction (HCC)    BPH (benign prostatic hyperplasia)    Chronic constipation    Chronic systolic heart failure (HCC)    History of ileus    Ileus (HCC) 03/07/2017   Mental retardation    Partial bowel obstruction (HCC)    Schizophrenia (HCC)    Monarche every three months;    SIRS (systemic inflammatory response syndrome) (HCC) 06/29/2018   Small bowel obstruction (HCC) 06/29/2018   History reviewed. No pertinent surgical history. Social History:  reports that he has quit smoking. His smoking use included cigarettes. He has a 7.5 pack-year smoking history. He has never  used smokeless tobacco. He reports that he does not drink alcohol and does not use drugs.  No Known Allergies  Family History  Problem Relation Age of Onset   Cancer Mother        unknown type    Prior to Admission medications   Medication Sig Start Date End Date Taking? Authorizing Provider  amantadine  (SYMMETREL ) 100 MG capsule Take 100 mg by mouth at bedtime.     [provider]  aspirin  EC (ASPIRIN  EC ADULT LOW DOSE) 81 MG tablet TAKE 1 TABLET BY MOUTH EVERY DAY 02/07/24   [name] HERO, MD  clotrimazole  (LOTRIMIN ) 1 % cream Apply topically 2 (two) times daily. 09/23/23   [name] HERO, MD  cloZAPine  (CLOZARIL ) 100 MG tablet Take 2 tablets (200 mg total) by mouth 2 (two) times daily. 03/12/17   Sebastian Toribio GAILS, MD  dapagliflozin  propanediol (FARXIGA ) 10 MG TABS tablet TAKE 1 TABLET BY MOUTH EVERY DAY 02/07/24   [name] HERO, MD  furosemide  (LASIX ) 40 MG tablet Take 1 tablet (40 mg total) by mouth as needed. 03/24/24   [name] HERO, MD  LINZESS  290 MCG CAPS capsule TAKE ONE CAPSULE DAILY BEFORE BREAKFAST 01/20/24   [name] HERO, MD  Melatonin 10 MG SUBL Place 10 mg under the tongue at bedtime.    [provider]  metoCLOPramide  (REGLAN ) 5 MG tablet TAKE 1 TABLET BY MOUTH 2 TIMES DAILY 03/04/24   [name] HERO, MD  metoprolol  succinate (TOPROL -XL) 25 MG 24 hr tablet TAKE 1/2 TABLET BY MOUTH EVERY DAY 02/07/24  [name] HERO, MD  midodrine  (PROAMATINE ) 5 MG tablet Take 1 tablet (5 mg total) by mouth 2 (two) times daily with a meal. 12/04/23   Tobb, Kardie, DO  MYRBETRIQ  50 MG TB24 tablet Take 50 mg by mouth daily. 01/06/21   [provider]  omeprazole  (PRILOSEC) 40 MG capsule TAKE 1 CAPSULE BY MOUTH EVERY EVENING 02/07/24   [name] HERO, MD  PARoxetine  (PAXIL ) 40 MG tablet Take 40 mg by mouth every evening.     [provider]  polyethylene glycol powder (GLYCOLAX /MIRALAX ) 17 GM/SCOOP powder Take 17 g by mouth See admin instructions. 17 g  twice weekly on Monday and Thursday. 05/14/24   [name] HERO, MD  sacubitril -valsartan  (ENTRESTO ) 24-26 MG TAKE 1 TABLET BY MOUTH 2 TIMES DAILY 02/07/24   [name] HERO, MD  SENNA PLUS 8.6-50 MG tablet TAKE 1 TABLET BY MOUTH 2 TIMES DAILY 03/19/24   [name] HERO, MD  tamsulosin  (FLOMAX ) 0.4 MG CAPS capsule Take 0.4 mg by mouth daily. 01/10/21   [provider]  zolpidem  (AMBIEN ) 10 MG tablet Take 1 tablet (10 mg total) by mouth at bedtime. 06/19/23   [name] HERO, MD    Physical Exam: Vitals:   05/15/24 1245 05/15/24 1300 05/15/24 1415 05/15/24 1445  BP: 101/69 94/67 95/68  97/66  Pulse: 91 86 87 86  Resp: (!) 29 18 (!) 22 (!) 24  Temp:      TempSrc:      SpO2: 100% 100% 100% 100%  Weight:      Height:       General: Alert, oriented x3, resting comfortably in no acute distress Respiratory: Lungs clear to auscultation bilaterally with normal respiratory effort; no w/r/r Cardiovascular: Regular rate and rhythm w/o m/r/g Abdomen: Soft, nontender, nondistended. Positive bowel sounds   Data Reviewed:  Lab Results  Component Value Date   WBC 27.7 (H) 05/15/2024   HGB 11.6 (L) 05/15/2024   HCT 34.0 (L) 05/15/2024   MCV 94.4 05/15/2024   PLT 400 05/15/2024   Lab Results  Component Value Date   GLUCOSE 110 (H) 05/15/2024   CALCIUM  8.1 (L) 05/15/2024   NA 140 05/15/2024   K 4.7 05/15/2024   CO2 21 (L) 05/15/2024   CL 108 05/15/2024   BUN 81 (H) 05/15/2024   CREATININE 6.70 (H) 05/15/2024   Lab Results  Component Value Date   ALT 56 (H) 05/15/2024   AST 29 05/15/2024   ALKPHOS 101 05/15/2024   BILITOT 0.9 05/15/2024   Lab Results  Component Value Date   INR 1.3 (H) 05/15/2024    Radiology: DG Abdomen 1 View Result Date: 05/15/2024 CLINICAL DATA:  Abdominal pain. EXAM: ABDOMEN - 1 VIEW COMPARISON:  07/11/2018. Abdomen and pelvis CT dated 05/15/2024. Portable chest obtained earlier today. FINDINGS: Stable dilated, gas and stool-filled transverse colon.  There was some mesenteric swirling in the left mid abdomen on the recent CT. There was no bowel wall thickening or pneumatosis on the recent CT. Small amount of gas and stool in the rectum and sigmoid colon. Mild-to-moderate lumbar spine degenerative changes. Recently demonstrated enlarged heart and pulmonary edema. IMPRESSION: 1. Stable dilated, gas and stool-filled transverse colon. This could be due to a colonic ileus or a partial colonic obstruction due to an internal hernia. 2. No bowel wall thickening or pneumatosis on the recent CT. Electronically Signed   By: Elspeth Bathe M.D.   On: 05/15/2024 14:32   CT ABDOMEN PELVIS WO CONTRAST Result Date: 05/15/2024  CLINICAL DATA:  Generalized weakness and abdominal pain. EXAM: CT ABDOMEN AND PELVIS WITHOUT CONTRAST TECHNIQUE: Multidetector CT imaging of the abdomen and pelvis was performed following the standard protocol without IV contrast. RADIATION DOSE REDUCTION: This exam was performed according to the departmental dose-optimization program which includes automated exposure control, adjustment of the mA and/or kV according to patient size and/or use of iterative reconstruction technique. COMPARISON:  07/10/2018 FINDINGS: Lower chest: Patchy airspace disease is seen in both lower lobes with small bilateral pleural effusions. Hepatobiliary: No suspicious focal abnormality in the liver on this study without intravenous contrast. There is no evidence for gallstones, gallbladder wall thickening, or pericholecystic fluid. Common bile duct measures 7 mm diameter, upper normal to mildly enlarged for patient age. Pancreas: No focal mass lesion. No dilatation of the main duct. No intraparenchymal cyst. No peripancreatic edema. Spleen: No splenomegaly. No suspicious focal mass lesion. Adrenals/Urinary Tract: No adrenal nodule or mass. Mild right hydroureteronephrosis with mild right perinephric edema. There is moderate left hydroureteronephrosis with fairly extensive left  perinephric edema/inflammation/hemorrhage. Left ureteral fullness extends into the pelvis. Bladder is markedly distended. Stomach/Bowel: Stomach is decompressed. Duodenum is normally positioned as is the ligament of Treitz. No small bowel wall thickening. No small bowel dilatation. Non dependent transverse colon is dilated with gas and stool measuring up to about 7.5 cm maximum diameter. No colonic wall thickening. Vascular/Lymphatic: No abdominal aortic aneurysm. There is no gastrohepatic or hepatoduodenal ligament lymphadenopathy. No retroperitoneal or mesenteric lymphadenopathy. No pelvic sidewall lymphadenopathy. Reproductive: Prostate gland is enlarged. Other: Small volume free fluid is seen around the liver and spleen. Small volume fluid layers in the para colic gutters and anterior pelvis. Diffuse body wall edema noted in the abdomen and pelvis. Musculoskeletal: No worrisome lytic or sclerotic osseous abnormality. IMPRESSION: 1. Moderate left hydroureteronephrosis with fairly extensive left perinephric edema/inflammation/hemorrhage. Assessment is limited by lack of intravenous contrast material. Left ureteral fullness extends into the pelvis. No obstructing calculus identified. Findings in the left retroperitoneal space may reflect urine leak/caliceal rupture, pyelonephritis/urinary tract infection, sequelae of neoplasm such as urothelial etiology. 2. Markedly distended urinary bladder likely contributes to appearance of the left hydroureteronephrosis. Associated prostatomegaly raises the question of bladder outlet obstruction. 3. Mild right hydroureteronephrosis with mild right perinephric edema. 4. Small volume free fluid in the abdomen and pelvis. 5. Diffuse body wall edema in the abdomen and pelvis. 6. Patchy airspace disease in both lower lobes with small bilateral pleural effusions. Imaging features suggest multifocal pneumonia. 7. Common bile duct measures 7 mm diameter, upper normal to mildly enlarged  for patient age. Correlation with liver function tests recommended. Electronically Signed   By: Camellia Candle M.D.   On: 05/15/2024 13:56   DG Chest Port 1 View Result Date: 05/15/2024 CLINICAL DATA:  Shortness of breath. EXAM: PORTABLE CHEST 1 VIEW COMPARISON:  Chest radiograph dated 11/25/2023. FINDINGS: There is cardiomegaly with vascular congestion and edema. No focal consolidation, pleural effusion or pneumothorax. No acute osseous pathology. IMPRESSION: Cardiomegaly with vascular congestion and edema. Electronically Signed   By: Vanetta Chou M.D.   On: 05/15/2024 13:16    Assessment and Plan: 82M h/o schizophrenia (Monarche q30m), SBO, BPH, AKI p/w AKI 2/2 BOO c/b L hydroureteronephrosis.  AKI 2/2 BOO c/b L hydroureteronephrosis -Urology consulted; apprec eval/recs -MIVF: LR at 150cc/h for now -NPO  Intractable N/V Possible SBO Imaging c/f BOO vs SBO -EGS consulted; apprec eval/recs -MIVF per above -Gastrograffin protocol -NPO  H/o schizophrenia -PTA clozapine  once tol PO  H/o  constipation -PTA senna/docusate 2 tabs BID and miralax  17g BID once tol PO  H/o BPH -PTA flomax  0.4mg  daily once tol PO   Advance Care Planning:   Code Status: Full Code   Consults: Urology  Family Communication: N/A  Severity of Illness: The appropriate patient status for this patient is INPATIENT. Inpatient status is judged to be reasonable and necessary in order to provide the required intensity of service to ensure the patient's safety. The patient's presenting symptoms, physical exam findings, and initial radiographic and laboratory data in the context of their chronic comorbidities is felt to place them at high risk for further clinical deterioration. Furthermore, it is not anticipated that the patient will be medically stable for discharge from the hospital within 2 midnights of admission.   * I certify that at the point of admission it is my clinical judgment that the patient will  require inpatient hospital care spanning beyond 2 midnights from the point of admission due to high intensity of service, high risk for further deterioration and high frequency of surveillance required.*   ------- I spent 55 minutes reviewing previous labs/notes, obtaining separate history at the bedside, counseling/discussing the treatment plan outlined above, ordering medications/tests, and performing clinical documentation.  Author: Marsha Ada, MD 05/15/2024 2:53 PM  For on call review [email or web address].

## 2024-05-15 NOTE — Sepsis Progress Note (Signed)
 eLink is following this Code Sepsis.

## 2024-05-15 NOTE — ED Provider Notes (Signed)
 I provided a substantive portion of the care of this patient.  I personally made/approved the management plan for this patient and take responsibility for the patient management.  EKG Interpretation Date/Time:  Friday May 15 2024 12:20:29 EDT Ventricular Rate:  89 PR Interval:  152 QRS Duration:  157 QT Interval:  430 QTC Calculation: 524 R Axis:   66  Text Interpretation: Sinus rhythm IVCD, consider atypical LBBB No significant change since last tracing Confirmed by Dasie Faden ([id number]) on 05/15/2024 12:52:40 PM   Patient's EKG shows sinus rhythm.  Unchanged from prior studies.  Patient here from doctors office due to weakness abdominal pain.  On physical exam, patient has abdominal distention.  Concern for possible obstruction.  He also has a severe acute kidney injury on his electrolytes.  Significant leukocytosis noted.  Patient be started on broad-spectrum antibiotics.  Patient possibly has perforation.  Will obtain abdominal CT and patient will require admission   Dasie Faden, MD 05/15/24 1329

## 2024-05-15 NOTE — ED Triage Notes (Signed)
 Pt BIB [facility]. EMS reports patient is coming from his PCP after being brought in for generalized weakness, abd pain, and chest pain. He lives in a group home per EMS undetermined which one. Complains of constipation for past 3 days. Denies n/v.   80 HR  119/59 BP sPo2 90-97 2L [place]  120 CBG 20ga LAC

## 2024-05-15 NOTE — Progress Notes (Signed)
 Established Patient Office Visit   Subjective:  Patient ID: Jordan Kennedy, male    DOB: 1956/08/22  Age: 68 y.o. MRN: [id number]  Chief Complaint  Patient presents with   Fatigue    Fatigue and weakness x 3 days.    Constipation    Constipation x 3 days.     Constipation Associated symptoms include abdominal pain. Pertinent negatives include no fever.   Encounter Diagnoses  Name Primary?   Hypotension, unspecified hypotension type Yes   Hypoxia    Constipation due to slow transit    Presents with a 3-day history of fatigue and weakness.  He is accompanied by his caregiver.  He actually lives with the caregiver.  Significant past medical history of severe systolic CHF with an ejection fraction of 20%.  Past medical history of tobacco use.  Significant history of schizophrenia and mentally challenged.  He is experiencing intermittent chest pain with shortness of breath.  Ongoing history of chronic constipation with a history of intestinal obstruction.  He has been taking MiraLAX  twice weekly.   Review of Systems  Constitutional:  Positive for malaise/fatigue. Negative for chills and fever.  HENT: Negative.    Eyes:  Negative for blurred vision, discharge and redness.  Respiratory:  Positive for shortness of breath. Negative for cough, hemoptysis, sputum production and wheezing.   Gastrointestinal:  Positive for abdominal pain and constipation.  Genitourinary: Negative.   Musculoskeletal: Negative.  Negative for myalgias.  Skin:  Negative for rash.  Neurological:  Negative for tingling, loss of consciousness and weakness.  Endo/Heme/Allergies:  Negative for polydipsia.     Current Outpatient Medications:    amantadine  (SYMMETREL ) 100 MG capsule, Take 100 mg by mouth at bedtime. , Disp: , Rfl:    aspirin  EC (ASPIRIN  EC ADULT LOW DOSE) 81 MG tablet, TAKE 1 TABLET BY MOUTH EVERY DAY, Disp: 90 tablet, Rfl: 3   clotrimazole  (LOTRIMIN ) 1 % cream, Apply topically 2 (two) times  daily., Disp: 30 g, Rfl: 5   cloZAPine  (CLOZARIL ) 100 MG tablet, Take 2 tablets (200 mg total) by mouth 2 (two) times daily., Disp: 120 tablet, Rfl: 0   dapagliflozin  propanediol (FARXIGA ) 10 MG TABS tablet, TAKE 1 TABLET BY MOUTH EVERY DAY, Disp: 90 tablet, Rfl: 3   furosemide  (LASIX ) 40 MG tablet, Take 1 tablet (40 mg total) by mouth as needed., Disp: 45 tablet, Rfl: 3   LINZESS  290 MCG CAPS capsule, TAKE ONE CAPSULE DAILY BEFORE BREAKFAST, Disp: 30 capsule, Rfl: 5   Melatonin 10 MG SUBL, Place 10 mg under the tongue at bedtime., Disp: , Rfl:    metoCLOPramide  (REGLAN ) 5 MG tablet, TAKE 1 TABLET BY MOUTH 2 TIMES DAILY, Disp: 60 tablet, Rfl: 2   metoprolol  succinate (TOPROL -XL) 25 MG 24 hr tablet, TAKE 1/2 TABLET BY MOUTH EVERY DAY, Disp: 90 tablet, Rfl: 3   midodrine  (PROAMATINE ) 5 MG tablet, Take 1 tablet (5 mg total) by mouth 2 (two) times daily with a meal., Disp: 180 tablet, Rfl: 3   MYRBETRIQ  50 MG TB24 tablet, Take 50 mg by mouth daily., Disp: , Rfl:    omeprazole  (PRILOSEC) 40 MG capsule, TAKE 1 CAPSULE BY MOUTH EVERY EVENING, Disp: 90 capsule, Rfl: 3   PARoxetine  (PAXIL ) 40 MG tablet, Take 40 mg by mouth every evening. , Disp: , Rfl:    polyethylene glycol powder (GLYCOLAX /MIRALAX ) 17 GM/SCOOP powder, Take 17 g by mouth See admin instructions. 17 g twice weekly on Monday and Thursday., Disp: 510 g, Rfl: 11  sacubitril -valsartan  (ENTRESTO ) 24-26 MG, TAKE 1 TABLET BY MOUTH 2 TIMES DAILY, Disp: 180 tablet, Rfl: 3   SENNA PLUS 8.6-50 MG tablet, TAKE 1 TABLET BY MOUTH 2 TIMES DAILY, Disp: 180 tablet, Rfl: 1   tamsulosin  (FLOMAX ) 0.4 MG CAPS capsule, Take 0.4 mg by mouth daily., Disp: , Rfl:    zolpidem  (AMBIEN ) 10 MG tablet, Take 1 tablet (10 mg total) by mouth at bedtime., Disp: 30 tablet, Rfl: 5   Objective:     BP (!) 86/60 (Patient Position: Sitting, Cuff Size: Large)   Pulse 91   Temp (!) 97.1 F (36.2 C) (Temporal)   Ht 6' 2 (1.88 m)   Wt 219 lb 9.6 oz (99.6 kg)   SpO2 (!) 85%    BMI 28.19 kg/m    Physical Exam Constitutional:      General: He is not in acute distress.    Appearance: Normal appearance. He is not ill-appearing, toxic-appearing or diaphoretic.  HENT:     Head: Normocephalic and atraumatic.     Right Ear: External ear normal.     Left Ear: External ear normal.  Eyes:     General: No scleral icterus.       Right eye: No discharge.        Left eye: No discharge.     Extraocular Movements: Extraocular movements intact.     Conjunctiva/sclera: Conjunctivae normal.  Cardiovascular:     Rate and Rhythm: Normal rate and regular rhythm.  Pulmonary:     Effort: Pulmonary effort is normal. No respiratory distress.     Breath sounds: Examination of the right-lower field reveals rhonchi. Examination of the left-lower field reveals rhonchi. Rhonchi present.  Musculoskeletal:     Right lower leg: No edema.     Left lower leg: No edema.  Skin:    General: Skin is warm and dry.  Neurological:     Mental Status: He is alert and oriented to person, place, and time.  Psychiatric:        Mood and Affect: Mood normal.        Behavior: Behavior normal.      No results found for any visits on 05/15/24.    The ASCVD Risk score (Arnett DK, et al., 2019) failed to calculate for the following reasons:   The valid systolic blood pressure range is 90 to 200 mmHg   Cannot find a previous HDL lab   Cannot find a previous total cholesterol lab    Assessment & Plan:   Hypotension, unspecified hypotension type  Hypoxia  Constipation due to slow transit    Return Schedule follow up with Dr. Thedora.[email or web address]   EMS was summoned.  May use the MiraLAX  daily as needed. Elsie Sim Lent, MD

## 2024-05-15 NOTE — ED Provider Notes (Signed)
 [place] [facility] AT [facility] Provider Note   CSN: [id number] Arrival date & time: 05/15/24  1211     Patient presents with: Abdominal Pain (Pt complaining of generalized weakness and chest pain)  HPI Jordan Kennedy is a 68 y.o. male with history of CHF, small bowel obstruction, schizophrenia presenting for abdominal pain and generalized weakness.  He states he has been having abdominal pain for about 3 days.  The pain is all over the abdomen he also endorses abdominal distention which is new for him.  Reports generalized weakness.  Denies nausea and vomiting.  Also reports that he has been constipated for 3 days.  Was seen for same at primary care office earlier today.  Was found to be hypotensive and hypoxic.  Was placed on 2 L and his hypoxia did improve.    Abdominal Pain      Prior to Admission medications   Medication Sig Start Date End Date Taking? Authorizing Provider  amantadine  (SYMMETREL ) 100 MG capsule Take 100 mg by mouth at bedtime.    Yes [provider]  aspirin  EC (ASPIRIN  EC ADULT LOW DOSE) 81 MG tablet TAKE 1 TABLET BY MOUTH EVERY DAY 02/07/24  Yes [name] HERO, MD  cloZAPine  (CLOZARIL ) 100 MG tablet Take 2 tablets (200 mg total) by mouth 2 (two) times daily. Patient taking differently: Take 200 mg by mouth at bedtime. 03/12/17  Yes Sebastian Toribio GAILS, MD  dapagliflozin  propanediol (FARXIGA ) 10 MG TABS tablet TAKE 1 TABLET BY MOUTH EVERY DAY 02/07/24  Yes [name] HERO, MD  furosemide  (LASIX ) 40 MG tablet Take 1 tablet (40 mg total) by mouth as needed. Patient taking differently: Take 40 mg by mouth as needed for edema or fluid. 03/24/24  Yes [name] HERO, MD  LINZESS  290 MCG CAPS capsule TAKE ONE CAPSULE DAILY BEFORE BREAKFAST 01/20/24  Yes [name] HERO, MD  Melatonin 10 MG SUBL Place 10 mg under the tongue at bedtime.   Yes [provider]  metoCLOPramide  (REGLAN ) 5 MG tablet TAKE 1 TABLET BY MOUTH 2 TIMES DAILY  03/04/24  Yes [name] HERO, MD  metoprolol  succinate (TOPROL -XL) 25 MG 24 hr tablet TAKE 1/2 TABLET BY MOUTH EVERY DAY 02/07/24  Yes [name] HERO, MD  midodrine  (PROAMATINE ) 5 MG tablet Take 1 tablet (5 mg total) by mouth 2 (two) times daily with a meal. 12/04/23  Yes Tobb, Kardie, DO  MYRBETRIQ  50 MG TB24 tablet Take 50 mg by mouth daily. 01/06/21  Yes [provider]  omeprazole  (PRILOSEC) 40 MG capsule TAKE 1 CAPSULE BY MOUTH EVERY EVENING 02/07/24  Yes [name] HERO, MD  PARoxetine  (PAXIL ) 40 MG tablet Take 40 mg by mouth every evening.    Yes [provider]  polyethylene glycol powder (GLYCOLAX /MIRALAX ) 17 GM/SCOOP powder Take 17 g by mouth See admin instructions. 17 g twice weekly on Monday and Thursday. 05/14/24  Yes [name] HERO, MD  sacubitril -valsartan  (ENTRESTO ) 24-26 MG TAKE 1 TABLET BY MOUTH 2 TIMES DAILY 02/07/24  Yes [name] HERO, MD  SENNA PLUS 8.6-50 MG tablet TAKE 1 TABLET BY MOUTH 2 TIMES DAILY 03/19/24  Yes [name] HERO, MD  tamsulosin  (FLOMAX ) 0.4 MG CAPS capsule Take 0.4 mg by mouth daily after supper. 01/10/21  Yes [provider]  zolpidem  (AMBIEN ) 10 MG tablet Take 1 tablet (10 mg total) by mouth at bedtime. 06/19/23  Yes [name] HERO, MD    Allergies: Patient has no known allergies.  Review of Systems  Gastrointestinal:  Positive for abdominal pain.    Updated Vital Signs BP 111/75 (BP Location: Left Arm)   Pulse 89   Temp 98.1 F (36.7 C) (Oral)   Resp 16   Ht 6' 3 (1.905 m)   Wt 99.6 kg   SpO2 95%   BMI 27.45 kg/m   Physical Exam Vitals and nursing note reviewed.  HENT:     Head: Normocephalic and atraumatic.     Mouth/Throat:     Mouth: Mucous membranes are moist.  Eyes:     General:        Right eye: No discharge.        Left eye: No discharge.     Conjunctiva/sclera: Conjunctivae normal.  Cardiovascular:     Rate and Rhythm: Normal rate and regular rhythm.     Pulses: Normal pulses.     Heart sounds:  Normal heart sounds.  Pulmonary:     Effort: Pulmonary effort is normal. Tachypnea present.     Breath sounds: Examination of the right-lower field reveals decreased breath sounds. Examination of the left-lower field reveals decreased breath sounds. Decreased breath sounds present. No wheezing, rhonchi or rales.     Comments: 100% on 2L Abdominal:     General: Abdomen is flat. There is distension.     Palpations: Abdomen is soft.     Tenderness: There is generalized abdominal tenderness.  Skin:    General: Skin is warm and dry.  Neurological:     General: No focal deficit present.  Psychiatric:        Mood and Affect: Mood normal.     (all labs ordered are listed, but only abnormal results are displayed) Labs Reviewed  COMPREHENSIVE METABOLIC PANEL WITH GFR - Abnormal; Notable for the following components:      Result Value   CO2 21 (*)    Glucose, Bld 116 (*)    BUN 93 (*)    Creatinine, Ser 6.63 (*)    Calcium  8.1 (*)    Total Protein 5.2 (*)    Albumin 2.0 (*)    ALT 56 (*)    GFR, Estimated 9 (*)    All other components within normal limits  CBC - Abnormal; Notable for the following components:   WBC 27.7 (*)    RBC 3.60 (*)    Hemoglobin 11.3 (*)    HCT 34.0 (*)    RDW 17.1 (*)    All other components within normal limits  PROTIME-INR - Abnormal; Notable for the following components:   Prothrombin Time 16.6 (*)    INR 1.3 (*)    All other components within normal limits  URINALYSIS, W/ REFLEX TO CULTURE (INFECTION SUSPECTED) - Abnormal; Notable for the following components:   APPearance HAZY (*)    Glucose, UA >=500 (*)    Hgb urine dipstick SMALL (*)    Leukocytes,Ua SMALL (*)    Bacteria, UA RARE (*)    All other components within normal limits  PSA - Abnormal; Notable for the following components:   Prostatic Specific Antigen 11.15 (*)    All other components within normal limits  BASIC METABOLIC PANEL WITH GFR - Abnormal; Notable for the following  components:   Chloride 112 (*)    CO2 20 (*)    BUN 66 (*)    Creatinine, Ser 3.89 (*)    Calcium  8.4 (*)    GFR, Estimated 16 (*)    All other components within normal  limits  CBC WITH DIFFERENTIAL/PLATELET - Abnormal; Notable for the following components:   WBC 23.9 (*)    RBC 3.74 (*)    Hemoglobin 11.6 (*)    HCT 34.9 (*)    RDW 16.9 (*)    Neutro Abs 20.4 (*)    Monocytes Absolute 2.3 (*)    Abs Immature Granulocytes 0.18 (*)    All other components within normal limits  CBG MONITORING, ED - Abnormal; Notable for the following components:   Glucose-Capillary 122 (*)    All other components within normal limits  I-STAT CHEM 8, ED - Abnormal; Notable for the following components:   BUN 81 (*)    Creatinine, Ser 6.70 (*)    Glucose, Bld 110 (*)    Calcium , Ion 1.08 (*)    TCO2 20 (*)    Hemoglobin 11.6 (*)    HCT 34.0 (*)    All other components within normal limits  TROPONIN I (HIGH SENSITIVITY) - Abnormal; Notable for the following components:   Troponin I (High Sensitivity) 45 (*)    All other components within normal limits  TROPONIN I (HIGH SENSITIVITY) - Abnormal; Notable for the following components:   Troponin I (High Sensitivity) 40 (*)    All other components within normal limits  RESP PANEL BY RT-PCR (RSV, FLU A&B, COVID)  RVPGX2  CULTURE, BLOOD (ROUTINE X 2)  CULTURE, BLOOD (ROUTINE X 2)  URINE CULTURE  URINE CULTURE  LIPASE, BLOOD  HIV ANTIBODY (ROUTINE TESTING W REFLEX)  CLOZAPINE  (CLOZARIL )  I-STAT CG4 LACTIC ACID, ED  I-STAT CG4 LACTIC ACID, ED    EKG: EKG Interpretation Date/Time:  Friday May 15 2024 12:20:29 EDT Ventricular Rate:  89 PR Interval:  152 QRS Duration:  157 QT Interval:  430 QTC Calculation: 524 R Axis:   66  Text Interpretation: Sinus rhythm IVCD, consider atypical LBBB No significant change since last tracing Confirmed by Dasie Faden ([id number]) on 05/15/2024 12:52:40 PM  Radiology: [facility] Abdomen 1 View Result Date:  05/15/2024 CLINICAL DATA:  Abdominal pain. EXAM: ABDOMEN - 1 VIEW COMPARISON:  07/11/2018. Abdomen and pelvis CT dated 05/15/2024. Portable chest obtained earlier today. FINDINGS: Stable dilated, gas and stool-filled transverse colon. There was some mesenteric swirling in the left mid abdomen on the recent CT. There was no bowel wall thickening or pneumatosis on the recent CT. Small amount of gas and stool in the rectum and sigmoid colon. Mild-to-moderate lumbar spine degenerative changes. Recently demonstrated enlarged heart and pulmonary edema. IMPRESSION: 1. Stable dilated, gas and stool-filled transverse colon. This could be due to a colonic ileus or a partial colonic obstruction due to an internal hernia. 2. No bowel wall thickening or pneumatosis on the recent CT. Electronically Signed   By: Elspeth Bathe M.D.   On: 05/15/2024 14:32   CT ABDOMEN PELVIS WO CONTRAST Result Date: 05/15/2024 CLINICAL DATA:  Generalized weakness and abdominal pain. EXAM: CT ABDOMEN AND PELVIS WITHOUT CONTRAST TECHNIQUE: Multidetector CT imaging of the abdomen and pelvis was performed following the standard protocol without IV contrast. RADIATION DOSE REDUCTION: This exam was performed according to the departmental dose-optimization program which includes automated exposure control, adjustment of the mA and/or kV according to patient size and/or use of iterative reconstruction technique. COMPARISON:  07/10/2018 FINDINGS: Lower chest: Patchy airspace disease is seen in both lower lobes with small bilateral pleural effusions. Hepatobiliary: No suspicious focal abnormality in the liver on this study without intravenous contrast. There is no evidence for gallstones, gallbladder wall thickening,  or pericholecystic fluid. Common bile duct measures 7 mm diameter, upper normal to mildly enlarged for patient age. Pancreas: No focal mass lesion. No dilatation of the main duct. No intraparenchymal cyst. No peripancreatic edema. Spleen: No  splenomegaly. No suspicious focal mass lesion. Adrenals/Urinary Tract: No adrenal nodule or mass. Mild right hydroureteronephrosis with mild right perinephric edema. There is moderate left hydroureteronephrosis with fairly extensive left perinephric edema/inflammation/hemorrhage. Left ureteral fullness extends into the pelvis. Bladder is markedly distended. Stomach/Bowel: Stomach is decompressed. Duodenum is normally positioned as is the ligament of Treitz. No small bowel wall thickening. No small bowel dilatation. Non dependent transverse colon is dilated with gas and stool measuring up to about 7.5 cm maximum diameter. No colonic wall thickening. Vascular/Lymphatic: No abdominal aortic aneurysm. There is no gastrohepatic or hepatoduodenal ligament lymphadenopathy. No retroperitoneal or mesenteric lymphadenopathy. No pelvic sidewall lymphadenopathy. Reproductive: Prostate gland is enlarged. Other: Small volume free fluid is seen around the liver and spleen. Small volume fluid layers in the para colic gutters and anterior pelvis. Diffuse body wall edema noted in the abdomen and pelvis. Musculoskeletal: No worrisome lytic or sclerotic osseous abnormality. IMPRESSION: 1. Moderate left hydroureteronephrosis with fairly extensive left perinephric edema/inflammation/hemorrhage. Assessment is limited by lack of intravenous contrast material. Left ureteral fullness extends into the pelvis. No obstructing calculus identified. Findings in the left retroperitoneal space may reflect urine leak/caliceal rupture, pyelonephritis/urinary tract infection, sequelae of neoplasm such as urothelial etiology. 2. Markedly distended urinary bladder likely contributes to appearance of the left hydroureteronephrosis. Associated prostatomegaly raises the question of bladder outlet obstruction. 3. Mild right hydroureteronephrosis with mild right perinephric edema. 4. Small volume free fluid in the abdomen and pelvis. 5. Diffuse body wall  edema in the abdomen and pelvis. 6. Patchy airspace disease in both lower lobes with small bilateral pleural effusions. Imaging features suggest multifocal pneumonia. 7. Common bile duct measures 7 mm diameter, upper normal to mildly enlarged for patient age. Correlation with liver function tests recommended. Electronically Signed   By: Camellia Candle M.D.   On: 05/15/2024 13:56   DG Chest Port 1 View Result Date: 05/15/2024 CLINICAL DATA:  Shortness of breath. EXAM: PORTABLE CHEST 1 VIEW COMPARISON:  Chest radiograph dated 11/25/2023. FINDINGS: There is cardiomegaly with vascular congestion and edema. No focal consolidation, pleural effusion or pneumothorax. No acute osseous pathology. IMPRESSION: Cardiomegaly with vascular congestion and edema. Electronically Signed   By: Vanetta Chou M.D.   On: 05/15/2024 13:16     .Critical Care  Performed by: Lang Norleen POUR, PA-C Authorized by: Lang Norleen POUR, PA-C   Critical care provider statement:    Critical care time (minutes):  30   Critical care was necessary to treat or prevent imminent or life-threatening deterioration of the following conditions:  Sepsis and renal failure   Critical care was time spent personally by me on the following activities:  Development of treatment plan with patient or surrogate, discussions with consultants, evaluation of patient's response to treatment, examination of patient, ordering and review of laboratory studies, ordering and review of radiographic studies, ordering and performing treatments and interventions, pulse oximetry, re-evaluation of patient's condition and review of old charts    Medications Ordered in the ED  lactated ringers  infusion ( Intravenous New Bag/Given 05/15/24 2239)  amantadine  (SYMMETREL ) capsule 100 mg (100 mg Oral Not Given 05/15/24 2200)  cloZAPine  (CLOZARIL ) tablet 200 mg (200 mg Oral Not Given 05/15/24 2200)  linaclotide  (LINZESS ) capsule 290 mcg (290 mcg Oral Not Given 05/16/24 0804)  metoprolol  succinate (TOPROL -XL) 24 hr tablet 12.5 mg (has no administration in time range)  tamsulosin  (FLOMAX ) capsule 0.4 mg (0.4 mg Oral Not Given 05/15/24 1703)  senna-docusate (Senokot-S) tablet 2 tablet (2 tablets Oral Not Given 05/15/24 2200)  polyethylene glycol (MIRALAX  / GLYCOLAX ) packet 17 g (17 g Oral Not Given 05/15/24 2200)  melatonin tablet 10 mg (10 mg Oral Not Given 05/15/24 2200)  diatrizoate  meglumine -sodium (GASTROGRAFIN ) 66-10 % solution 90 mL (has no administration in time range)  sodium chloride  0.9 % bolus 1,000 mL (0 mLs Intravenous Stopped 05/15/24 1427)  ondansetron  (ZOFRAN ) injection 4 mg (4 mg Intravenous Given 05/15/24 1248)  metoCLOPramide  (REGLAN ) injection 10 mg (10 mg Intravenous Given 05/15/24 1248)  ceFEPIme  (MAXIPIME ) 2 g in sodium chloride  0.9 % 100 mL IVPB (0 g Intravenous Stopped 05/15/24 1427)  metroNIDAZOLE  (FLAGYL ) IVPB 500 mg (0 mg Intravenous Stopped 05/15/24 1504)  vancomycin  (VANCOREADY) IVPB 2000 mg/400 mL (0 mg Intravenous Stopped 05/15/24 1606)                                    Medical Decision Making Amount and/or Complexity of Data Reviewed Labs: ordered. Radiology: ordered.  Risk Prescription drug management. Decision regarding hospitalization.   Initial Impression and Ddx 68 year old well-appearing male presenting for abdominal pain and shortness of breath.  Exam notable for distended abdomen with generalized abdominal tenderness, tachypnea on 2 L.  Also initial white blood cell count 27,000.  Activated code sepsis and placed on broad-spectrum antibiotics.  DDx includes pneumonia, ACS, PE, bowel obstruction, intra-abdominal infection, kidney stone, CHF exacerbation, other. Patient PMH that increases complexity of ED encounter:  history of CHF, small bowel obstruction, schizophrenia   Interpretation of Diagnostics - I independent reviewed and interpreted the labs as followed: Markedly elevated BUN at 93 and creatinine is 6.63 (prior  Bun/Cr WNL), leukocytosis  - I independently visualized the following imaging with scope of interpretation limited to determining acute life threatening conditions related to emergency care: CT, which revealed concerns for bladder outlet obstruction, extensive bowel gas, and possible pneumonia.  See details above.  Chest x-ray showed vascular congestion and pulmonary edema.  -I personally reviewed and interpreted EKG which revealed sinus rhythm Patient Reassessment and Ultimate Disposition/Management Workup revealing concern for bladder outlet obstruction and concern for sepsis as well.  Discussed with Gwenlyn Bras NP of urology, he recommended Foley placement and admission to medicine and urology would be following him.  Agreed with IV antibiotics.  We placed a Foley and he immediately voided approximately 2 L of urine.  Admitted to hospital service with Dr. Marsha Ada.  Patient management required discussion with the following services or consulting groups:  Hospitalist Service and Urology  Complexity of Problems Addressed Acute complicated illness or Injury  Additional Data Reviewed and Analyzed Further history obtained from: Past medical history and medications listed in the EMR and Prior ED visit notes  Patient Encounter Risk Assessment Consideration of hospitalization      Final diagnoses:  Abdominal pain, unspecified abdominal location  Chest pain, unspecified type    ED Discharge Orders     None          Lang Norleen POUR, PA-C 05/16/24 [id number]    Dasie Faden, MD 05/17/24 1258

## 2024-05-15 NOTE — Consult Note (Signed)
 Urology Consult Note   Requesting Attending Physician:  Georgina Basket, MD Service Providing Consult: Urology  Consulting Attending: Dr. Nieves    Reason for Consult:  bladder outlet obstruction, BPH  HPI: Jordan Kennedy is seen in consultation for reasons noted above at the request of Georgina Basket, MD. Patient is a 68 year old male known to our practice.  He resides with his caregiver and presents to [facility] emergency department with complaints of fatigue and weakness x 3 days.  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  While in the emergency department bladder scan > .  3F Foley catheter was placed and around 2L of urine was returned.  CT A/P notes markedly enlarged prostate with evidence of previous TURP, median lobe with bladder invasion.  On my arrival patient was alert but had clear cognitive deficiencies.  He was primarily concerned with getting something to eat or drink and the answers to most of his questions did not align with his medical record.  ------------------  Assessment:   68 y.o. male with median lobe and bladder invasion, BOO, and AKI   Recommendations: # Bladder outlet obstruction # BPH with median lobe, bladder invasion # Mild hematuria # AKI # Septic picture  Hold Myrbetriq  and start Flomax .  I imagine his urinary incontinence of late has more to do with overflow than overactive bladder.  He may benefit from a repeat TURP and can discuss in clinic with his regular surgeon, Dr. Cam.  Foley catheter to stay in place through discharge.  Trend labs.  Monitor for postobstructive diuresis and supplement electrolytes as necessary.  Significant AKI of 6.63  CT A/P slows significant perinephric stranding d/t his urinary obstruction.  On independent review I did not appreciate blood products, nor to Dr. Nieves.  This could happen in the short-term  from stretch injury.  Regardless, if rupture was present, the treatment would be bladder decompression with the catheter he already has.  No acute surgical indication.  Urology will follow peripherally  Case and plan discussed with Dr. Nieves  Past Medical History: Past Medical History:  Diagnosis Date   AKI (acute kidney injury) (HCC) 06/29/2018   Anemia    Anemia, iron deficiency 09/18/2015   Bowel obstruction (HCC)    BPH (benign prostatic hyperplasia)    Chronic constipation    Chronic systolic heart failure (HCC)    History of ileus    Ileus (HCC) 03/07/2017   Mental retardation    Partial bowel obstruction (HCC)    Schizophrenia (HCC)    Monarche every three months;    SIRS (systemic inflammatory response syndrome) (HCC) 06/29/2018   Small bowel obstruction (HCC) 06/29/2018    Past Surgical History:  History reviewed. No pertinent surgical history.  Medication: Current Facility-Administered Medications  Medication Dose Route Frequency Provider Last Rate Last Admin   amantadine  (SYMMETREL ) capsule 100 mg  100 mg Oral QHS Georgina Basket, MD       cloZAPine  (CLOZARIL ) tablet 200 mg  200 mg Oral BID Georgina Basket, MD       lactated ringers  infusion   Intravenous Continuous Robinson, John K, PA-C 150 mL/hr at 05/15/24 1357 New Bag at 05/15/24 1357   [START ON 05/16/2024] linaclotide  (LINZESS ) capsule 290 mcg  290 mcg Oral QAC breakfast Georgina Basket, MD       Melatonin SUBL 10 mg  10 mg Sublingual QHS Georgina Basket, MD       metoprolol  succinate (  TOPROL -XL) 24 hr tablet 12.5 mg  12.5 mg Oral Daily Georgina Basket, MD       tamsulosin  (FLOMAX ) capsule 0.4 mg  0.4 mg Oral Daily Georgina Basket, MD       vancomycin  LUCRECIA) IVPB 2000 mg/400 mL  2,000 mg Intravenous Once Dasie Faden, MD 200 mL/hr at 05/15/24 1358 2,000 mg at 05/15/24 1358   Current Outpatient Medications  Medication Sig Dispense Refill   amantadine  (SYMMETREL ) 100 MG capsule Take 100 mg by mouth at bedtime.       aspirin  EC (ASPIRIN  EC ADULT LOW DOSE) 81 MG tablet TAKE 1 TABLET BY MOUTH EVERY DAY 90 tablet 3   clotrimazole  (LOTRIMIN ) 1 % cream Apply topically 2 (two) times daily. 30 g 5   cloZAPine  (CLOZARIL ) 100 MG tablet Take 2 tablets (200 mg total) by mouth 2 (two) times daily. 120 tablet 0   dapagliflozin  propanediol (FARXIGA ) 10 MG TABS tablet TAKE 1 TABLET BY MOUTH EVERY DAY 90 tablet 3   furosemide  (LASIX ) 40 MG tablet Take 1 tablet (40 mg total) by mouth as needed. 45 tablet 3   LINZESS  290 MCG CAPS capsule TAKE ONE CAPSULE DAILY BEFORE BREAKFAST 30 capsule 5   Melatonin 10 MG SUBL Place 10 mg under the tongue at bedtime.     metoCLOPramide  (REGLAN ) 5 MG tablet TAKE 1 TABLET BY MOUTH 2 TIMES DAILY 60 tablet 2   metoprolol  succinate (TOPROL -XL) 25 MG 24 hr tablet TAKE 1/2 TABLET BY MOUTH EVERY DAY 90 tablet 3   midodrine  (PROAMATINE ) 5 MG tablet Take 1 tablet (5 mg total) by mouth 2 (two) times daily with a meal. 180 tablet 3   MYRBETRIQ  50 MG TB24 tablet Take 50 mg by mouth daily.     omeprazole  (PRILOSEC) 40 MG capsule TAKE 1 CAPSULE BY MOUTH EVERY EVENING 90 capsule 3   PARoxetine  (PAXIL ) 40 MG tablet Take 40 mg by mouth every evening.      polyethylene glycol powder (GLYCOLAX /MIRALAX ) 17 GM/SCOOP powder Take 17 g by mouth See admin instructions. 17 g twice weekly on Monday and Thursday. 510 g 11   sacubitril -valsartan  (ENTRESTO ) 24-26 MG TAKE 1 TABLET BY MOUTH 2 TIMES DAILY 180 tablet 3   SENNA PLUS 8.6-50 MG tablet TAKE 1 TABLET BY MOUTH 2 TIMES DAILY 180 tablet 1   tamsulosin  (FLOMAX ) 0.4 MG CAPS capsule Take 0.4 mg by mouth daily.     zolpidem  (AMBIEN ) 10 MG tablet Take 1 tablet (10 mg total) by mouth at bedtime. 30 tablet 5    Allergies: No Known Allergies  Social History: Social History   Tobacco Use   Smoking status: Former    Current packs/day: 0.25    Average packs/day: 0.3 packs/day for 30.0 years (7.5 ttl pk-yrs)    Types: Cigarettes   Smokeless tobacco: Never   Vaping Use   Vaping status: Never Used  Substance Use Topics   Alcohol use: No   Drug use: No    Family History Family History  Problem Relation Age of Onset   Cancer Mother        unknown type    Review of Systems  Genitourinary:  Positive for flank pain, frequency, hematuria and urgency.     Objective   Vital signs in last 24 hours: BP 97/66   Pulse 86   Temp 98.2 F (36.8 C) (Oral)   Resp (!) 24   Ht 6' 3 (1.905 m)   Wt 99.6 kg   SpO2 100%  BMI 27.45 kg/m   Physical Exam General: Alert, resting HEENT: [place]/AT Pulmonary: Normal work of breathing Cardiovascular: no cyanosis Abdomen: Soft, NTTP, nondistended GU: 54f foley in place draining clear blood tinged urine Neuro: cognitive deficit  Most Recent Labs: Lab Results  Component Value Date   WBC 27.7 (H) 05/15/2024   HGB 11.6 (L) 05/15/2024   HCT 34.0 (L) 05/15/2024   PLT 400 05/15/2024    Lab Results  Component Value Date   NA 140 05/15/2024   K 4.7 05/15/2024   CL 108 05/15/2024   CO2 21 (L) 05/15/2024   BUN 81 (H) 05/15/2024   CREATININE 6.70 (H) 05/15/2024   CALCIUM  8.1 (L) 05/15/2024   MG 2.1 06/25/2023    Lab Results  Component Value Date   INR 1.3 (H) 05/15/2024     Urine Culture: @[id number] (laburin,org,[id number])@   IMAGING: DG Abdomen 1 View Result Date: 05/15/2024 CLINICAL DATA:  Abdominal pain. EXAM: ABDOMEN - 1 VIEW COMPARISON:  07/11/2018. Abdomen and pelvis CT dated 05/15/2024. Portable chest obtained earlier today. FINDINGS: Stable dilated, gas and stool-filled transverse colon. There was some mesenteric swirling in the left mid abdomen on the recent CT. There was no bowel wall thickening or pneumatosis on the recent CT. Small amount of gas and stool in the rectum and sigmoid colon. Mild-to-moderate lumbar spine degenerative changes. Recently demonstrated enlarged heart and pulmonary edema. IMPRESSION: 1. Stable dilated, gas and stool-filled transverse colon. This could be  due to a colonic ileus or a partial colonic obstruction due to an internal hernia. 2. No bowel wall thickening or pneumatosis on the recent CT. Electronically Signed   By: Elspeth Bathe M.D.   On: 05/15/2024 14:32   CT ABDOMEN PELVIS WO CONTRAST Result Date: 05/15/2024 CLINICAL DATA:  Generalized weakness and abdominal pain. EXAM: CT ABDOMEN AND PELVIS WITHOUT CONTRAST TECHNIQUE: Multidetector CT imaging of the abdomen and pelvis was performed following the standard protocol without IV contrast. RADIATION DOSE REDUCTION: This exam was performed according to the departmental dose-optimization program which includes automated exposure control, adjustment of the mA and/or kV according to patient size and/or use of iterative reconstruction technique. COMPARISON:  07/10/2018 FINDINGS: Lower chest: Patchy airspace disease is seen in both lower lobes with small bilateral pleural effusions. Hepatobiliary: No suspicious focal abnormality in the liver on this study without intravenous contrast. There is no evidence for gallstones, gallbladder wall thickening, or pericholecystic fluid. Common bile duct measures 7 mm diameter, upper normal to mildly enlarged for patient age. Pancreas: No focal mass lesion. No dilatation of the main duct. No intraparenchymal cyst. No peripancreatic edema. Spleen: No splenomegaly. No suspicious focal mass lesion. Adrenals/Urinary Tract: No adrenal nodule or mass. Mild right hydroureteronephrosis with mild right perinephric edema. There is moderate left hydroureteronephrosis with fairly extensive left perinephric edema/inflammation/hemorrhage. Left ureteral fullness extends into the pelvis. Bladder is markedly distended. Stomach/Bowel: Stomach is decompressed. Duodenum is normally positioned as is the ligament of Treitz. No small bowel wall thickening. No small bowel dilatation. Non dependent transverse colon is dilated with gas and stool measuring up to about 7.5 cm maximum diameter. No colonic  wall thickening. Vascular/Lymphatic: No abdominal aortic aneurysm. There is no gastrohepatic or hepatoduodenal ligament lymphadenopathy. No retroperitoneal or mesenteric lymphadenopathy. No pelvic sidewall lymphadenopathy. Reproductive: Prostate gland is enlarged. Other: Small volume free fluid is seen around the liver and spleen. Small volume fluid layers in the para colic gutters and anterior pelvis. Diffuse body wall edema noted in the abdomen and pelvis. Musculoskeletal: No worrisome  lytic or sclerotic osseous abnormality. IMPRESSION: 1. Moderate left hydroureteronephrosis with fairly extensive left perinephric edema/inflammation/hemorrhage. Assessment is limited by lack of intravenous contrast material. Left ureteral fullness extends into the pelvis. No obstructing calculus identified. Findings in the left retroperitoneal space may reflect urine leak/caliceal rupture, pyelonephritis/urinary tract infection, sequelae of neoplasm such as urothelial etiology. 2. Markedly distended urinary bladder likely contributes to appearance of the left hydroureteronephrosis. Associated prostatomegaly raises the question of bladder outlet obstruction. 3. Mild right hydroureteronephrosis with mild right perinephric edema. 4. Small volume free fluid in the abdomen and pelvis. 5. Diffuse body wall edema in the abdomen and pelvis. 6. Patchy airspace disease in both lower lobes with small bilateral pleural effusions. Imaging features suggest multifocal pneumonia. 7. Common bile duct measures 7 mm diameter, upper normal to mildly enlarged for patient age. Correlation with liver function tests recommended. Electronically Signed   By: Camellia Candle M.D.   On: 05/15/2024 13:56   DG Chest Port 1 View Result Date: 05/15/2024 CLINICAL DATA:  Shortness of breath. EXAM: PORTABLE CHEST 1 VIEW COMPARISON:  Chest radiograph dated 11/25/2023. FINDINGS: There is cardiomegaly with vascular congestion and edema. No focal consolidation, pleural  effusion or pneumothorax. No acute osseous pathology. IMPRESSION: Cardiomegaly with vascular congestion and edema. Electronically Signed   By: Vanetta Chou M.D.   On: 05/15/2024 13:16    ------  Ole Bourdon, NP Pager: [phone]   Please contact the urology consult pager with any further questions/concerns.

## 2024-05-15 NOTE — Consult Note (Signed)
 Reason for Consult: Abdominal pain Referring Physician: Dr. Georgina [name] is an 68 y.o. male.  HPI: Patient is a 68 year old male who comes in today secondary to fatigue and weakness x 3 days.  Per the note patient was having no nausea or vomiting.  Patient with some abdominal distention.  He underwent CT scan.  CT scan was significant for moderate left hydroureteronephrosis, distended bladder, distended loops of small bowel.  There was concern for possible bowel obstruction.  Patient states he is currently hungry.  He denies any nausea or vomiting.  He is unsure of his last bowel movement.  History obtained via chart as patient appears to be mentally disabled.  Past Medical History:  Diagnosis Date   AKI (acute kidney injury) (HCC) 06/29/2018   Anemia    Anemia, iron deficiency 09/18/2015   Bowel obstruction (HCC)    BPH (benign prostatic hyperplasia)    Chronic constipation    Chronic systolic heart failure (HCC)    History of ileus    Ileus (HCC) 03/07/2017   Mental retardation    Partial bowel obstruction (HCC)    Schizophrenia (HCC)    Monarche every three months;    SIRS (systemic inflammatory response syndrome) (HCC) 06/29/2018   Small bowel obstruction (HCC) 06/29/2018    History reviewed. No pertinent surgical history.  Family History  Problem Relation Age of Onset   Cancer Mother        unknown type    Social History:  reports that he has quit smoking. His smoking use included cigarettes. He has a 7.5 pack-year smoking history. He has never used smokeless tobacco. He reports that he does not drink alcohol and does not use drugs.  Allergies: No Known Allergies  Medications: I have reviewed the patient's current medications.  Results for orders placed or performed during the hospital encounter of 05/15/24 (from the past 48 hours)  Urinalysis, w/ Reflex to Culture (Infection Suspected) -Urine, Clean Catch     Status: Abnormal   Collection Time: 05/15/24 12:26  PM  Result Value Ref Range   Specimen Source URINE, CLEAN CATCH    Color, Urine YELLOW YELLOW   APPearance HAZY (A) CLEAR   Specific Gravity, Urine 1.012 1.005 - 1.030   pH 5.0 5.0 - 8.0   Glucose, UA >=500 (A) NEGATIVE mg/dL   Hgb urine dipstick SMALL (A) NEGATIVE   Bilirubin Urine NEGATIVE NEGATIVE   Ketones, ur NEGATIVE NEGATIVE mg/dL   Protein, ur NEGATIVE NEGATIVE mg/dL   Nitrite NEGATIVE NEGATIVE   Leukocytes,Ua SMALL (A) NEGATIVE   RBC / HPF 21-50 0 - 5 RBC/hpf   WBC, UA 21-50 0 - 5 WBC/hpf    Comment:        Reflex urine culture not performed if WBC <=10, OR if Squamous epithelial cells >5. If Squamous epithelial cells >5 suggest recollection.    Bacteria, UA RARE (A) NONE SEEN   Squamous Epithelial / HPF 0-5 0 - 5 /HPF   Mucus PRESENT    Hyaline Casts, UA PRESENT    Amorphous Crystal PRESENT    Sperm, UA PRESENT     Comment: Performed at [facility], [address], [place], [place] [address]  Comprehensive metabolic panel     Status: Abnormal   Collection Time: 05/15/24 12:27 PM  Result Value Ref Range   Sodium 137 135 - 145 mmol/L   Potassium 4.6 3.5 - 5.1 mmol/L   Chloride 105 98 - 111 mmol/L   CO2 21 (  L) 22 - 32 mmol/L   Glucose, Bld 116 (H) 70 - 99 mg/dL    Comment: Glucose reference range applies only to samples taken after fasting for at least 8 hours.   BUN 93 (H) 8 - 23 mg/dL   Creatinine, Ser 3.36 (H) 0.61 - 1.24 mg/dL   Calcium  8.1 (L) 8.9 - 10.3 mg/dL   Total Protein 5.2 (L) 6.5 - 8.1 g/dL   Albumin 2.0 (L) 3.5 - 5.0 g/dL   AST 29 15 - 41 U/L   ALT 56 (H) 0 - 44 U/L   Alkaline Phosphatase 101 38 - 126 U/L   Total Bilirubin 0.9 0.0 - 1.2 mg/dL   GFR, Estimated 9 (L) >60 mL/min    Comment: (NOTE) Calculated using the CKD-EPI Creatinine Equation (2021)    Anion gap 11 5 - 15    Comment: Performed at [facility], [address] [place], [place] [address]  CBC     Status: Abnormal   Collection Time: 05/15/24 12:27 PM  Result Value  Ref Range   WBC 27.7 (H) 4.0 - 10.5 K/uL   RBC 3.60 (L) 4.22 - 5.81 MIL/uL   Hemoglobin 11.3 (L) 13.0 - 17.0 g/dL   HCT 65.9 (L) 60.9 - 47.9 %   MCV 94.4 80.0 - 100.0 fL   MCH 31.4 26.0 - 34.0 pg   MCHC 33.2 30.0 - 36.0 g/dL   RDW 82.8 (H) 88.4 - 84.4 %   Platelets 400 150 - 400 K/uL   nRBC 0.0 0.0 - 0.2 %    Comment: Performed at [facility], [address], [place], [place] [address]  Lipase, blood     Status: None   Collection Time: 05/15/24 12:27 PM  Result Value Ref Range   Lipase 34 11 - 51 U/L    Comment: Performed at [facility], [address], [place], [place] [address]  CBG monitoring, ED     Status: Abnormal   Collection Time: 05/15/24 12:32 PM  Result Value Ref Range   Glucose-Capillary 122 (H) 70 - 99 mg/dL    Comment: Glucose reference range applies only to samples taken after fasting for at least 8 hours.  I-Stat CG4 Lactic Acid     Status: None   Collection Time: 05/15/24 12:33 PM  Result Value Ref Range   Lactic Acid, Venous 1.1 0.5 - 1.9 mmol/L  I-stat chem 8, ED (not at [facility], [facility] or [facility])     Status: Abnormal   Collection Time: 05/15/24  1:02 PM  Result Value Ref Range   Sodium 140 135 - 145 mmol/L   Potassium 4.7 3.5 - 5.1 mmol/L   Chloride 108 98 - 111 mmol/L   BUN 81 (H) 8 - 23 mg/dL   Creatinine, Ser 3.29 (H) 0.61 - 1.24 mg/dL   Glucose, Bld 889 (H) 70 - 99 mg/dL    Comment: Glucose reference range applies only to samples taken after fasting for at least 8 hours.   Calcium , Ion 1.08 (L) 1.15 - 1.40 mmol/L   TCO2 20 (L) 22 - 32 mmol/L   Hemoglobin 11.6 (L) 13.0 - 17.0 g/dL   HCT 65.9 (L) 60.9 - 47.9 %  Protime-INR     Status: Abnormal   Collection Time: 05/15/24  1:27 PM  Result Value Ref Range   Prothrombin Time 16.6 (H) 11.4 - 15.2 seconds   INR 1.3 (H) 0.8 - 1.2    Comment: (NOTE) INR goal varies  based on device and disease states. Performed at [facility], [address] [place], [place] [address]   Troponin I (High  Sensitivity)     Status: Abnormal   Collection Time: 05/15/24  2:04 PM  Result Value Ref Range   Troponin I (High Sensitivity) 45 (H) <18 ng/L    Comment: (NOTE) Elevated high sensitivity troponin I (hsTnI) values and significant  changes across serial measurements may suggest ACS but many other  chronic and acute conditions are known to elevate hsTnI results.  Refer to the Links section for chest pain algorithms and additional  guidance. Performed at [facility], [address], [place], [place] [address]   Resp panel by RT-PCR (RSV, Flu A&B, Covid) Anterior Nasal Swab     Status: None   Collection Time: 05/15/24  2:30 PM   Specimen: Anterior Nasal Swab  Result Value Ref Range   SARS Coronavirus 2 by RT PCR NEGATIVE NEGATIVE   Influenza A by PCR NEGATIVE NEGATIVE   Influenza B by PCR NEGATIVE NEGATIVE    Comment: (NOTE) The Xpert Xpress SARS-CoV-2/FLU/RSV plus assay is intended as an aid in the diagnosis of influenza from Nasopharyngeal swab specimens and should not be used as a sole basis for treatment. Nasal washings and aspirates are unacceptable for Xpert Xpress SARS-CoV-2/FLU/RSV testing.  Fact Sheet for Patients: BloggerCourse.com  Fact Sheet for Healthcare Providers: SeriousBroker.it  This test is not yet approved or cleared by the [place]  FDA and has been authorized for detection and/or diagnosis of SARS-CoV-2 by FDA under an Emergency Use Authorization (EUA). This EUA will remain in effect (meaning this test can be used) for the duration of the COVID-19 declaration under Section 564(b)(1) of the Act, 21 U.S.C. section 360bbb-3(b)(1), unless the authorization is terminated or revoked.     Resp Syncytial Virus by PCR NEGATIVE NEGATIVE    Comment: (NOTE) Fact Sheet for Patients: BloggerCourse.com  Fact Sheet for Healthcare  Providers: SeriousBroker.it  This test is not yet approved or cleared by the [place]  FDA and has been authorized for detection and/or diagnosis of SARS-CoV-2 by FDA under an Emergency Use Authorization (EUA). This EUA will remain in effect (meaning this test can be used) for the duration of the COVID-19 declaration under Section 564(b)(1) of the Act, 21 U.S.C. section 360bbb-3(b)(1), unless the authorization is terminated or revoked.  Performed at [facility], [address], [place], [place] [address]   Troponin I (High Sensitivity)     Status: Abnormal   Collection Time: 05/15/24  3:52 PM  Result Value Ref Range   Troponin I (High Sensitivity) 40 (H) <18 ng/L    Comment: (NOTE) Elevated high sensitivity troponin I (hsTnI) values and significant  changes across serial measurements may suggest ACS but many other  chronic and acute conditions are known to elevate hsTnI results.  Refer to the Links section for chest pain algorithms and additional  guidance. Performed at [facility], [address] [place], [place] [address]   I-Stat CG4 Lactic Acid     Status: None   Collection Time: 05/15/24  3:57 PM  Result Value Ref Range   Lactic Acid, Venous 1.2 0.5 - 1.9 mmol/L    DG Abdomen 1 View Result Date: 05/15/2024 CLINICAL DATA:  Abdominal pain. EXAM: ABDOMEN - 1 VIEW COMPARISON:  07/11/2018. Abdomen and pelvis CT dated 05/15/2024. Portable chest obtained earlier today. FINDINGS: Stable dilated, gas and stool-filled transverse colon. There was some mesenteric  swirling in the left mid abdomen on the recent CT. There was no bowel wall thickening or pneumatosis on the recent CT. Small amount of gas and stool in the rectum and sigmoid colon. Mild-to-moderate lumbar spine degenerative changes. Recently demonstrated enlarged heart and pulmonary edema. IMPRESSION: 1. Stable dilated, gas and stool-filled transverse colon. This could be due to a  colonic ileus or a partial colonic obstruction due to an internal hernia. 2. No bowel wall thickening or pneumatosis on the recent CT. Electronically Signed   By: Elspeth Bathe M.D.   On: 05/15/2024 14:32   CT ABDOMEN PELVIS WO CONTRAST Result Date: 05/15/2024 CLINICAL DATA:  Generalized weakness and abdominal pain. EXAM: CT ABDOMEN AND PELVIS WITHOUT CONTRAST TECHNIQUE: Multidetector CT imaging of the abdomen and pelvis was performed following the standard protocol without IV contrast. RADIATION DOSE REDUCTION: This exam was performed according to the departmental dose-optimization program which includes automated exposure control, adjustment of the mA and/or kV according to patient size and/or use of iterative reconstruction technique. COMPARISON:  07/10/2018 FINDINGS: Lower chest: Patchy airspace disease is seen in both lower lobes with small bilateral pleural effusions. Hepatobiliary: No suspicious focal abnormality in the liver on this study without intravenous contrast. There is no evidence for gallstones, gallbladder wall thickening, or pericholecystic fluid. Common bile duct measures 7 mm diameter, upper normal to mildly enlarged for patient age. Pancreas: No focal mass lesion. No dilatation of the main duct. No intraparenchymal cyst. No peripancreatic edema. Spleen: No splenomegaly. No suspicious focal mass lesion. Adrenals/Urinary Tract: No adrenal nodule or mass. Mild right hydroureteronephrosis with mild right perinephric edema. There is moderate left hydroureteronephrosis with fairly extensive left perinephric edema/inflammation/hemorrhage. Left ureteral fullness extends into the pelvis. Bladder is markedly distended. Stomach/Bowel: Stomach is decompressed. Duodenum is normally positioned as is the ligament of Treitz. No small bowel wall thickening. No small bowel dilatation. Non dependent transverse colon is dilated with gas and stool measuring up to about 7.5 cm maximum diameter. No colonic wall  thickening. Vascular/Lymphatic: No abdominal aortic aneurysm. There is no gastrohepatic or hepatoduodenal ligament lymphadenopathy. No retroperitoneal or mesenteric lymphadenopathy. No pelvic sidewall lymphadenopathy. Reproductive: Prostate gland is enlarged. Other: Small volume free fluid is seen around the liver and spleen. Small volume fluid layers in the para colic gutters and anterior pelvis. Diffuse body wall edema noted in the abdomen and pelvis. Musculoskeletal: No worrisome lytic or sclerotic osseous abnormality. IMPRESSION: 1. Moderate left hydroureteronephrosis with fairly extensive left perinephric edema/inflammation/hemorrhage. Assessment is limited by lack of intravenous contrast material. Left ureteral fullness extends into the pelvis. No obstructing calculus identified. Findings in the left retroperitoneal space may reflect urine leak/caliceal rupture, pyelonephritis/urinary tract infection, sequelae of neoplasm such as urothelial etiology. 2. Markedly distended urinary bladder likely contributes to appearance of the left hydroureteronephrosis. Associated prostatomegaly raises the question of bladder outlet obstruction. 3. Mild right hydroureteronephrosis with mild right perinephric edema. 4. Small volume free fluid in the abdomen and pelvis. 5. Diffuse body wall edema in the abdomen and pelvis. 6. Patchy airspace disease in both lower lobes with small bilateral pleural effusions. Imaging features suggest multifocal pneumonia. 7. Common bile duct measures 7 mm diameter, upper normal to mildly enlarged for patient age. Correlation with liver function tests recommended. Electronically Signed   By: Camellia Candle M.D.   On: 05/15/2024 13:56   DG Chest Port 1 View Result Date: 05/15/2024 CLINICAL DATA:  Shortness of breath. EXAM: PORTABLE CHEST 1 VIEW COMPARISON:  Chest radiograph dated 11/25/2023. FINDINGS: There  is cardiomegaly with vascular congestion and edema. No focal consolidation, pleural  effusion or pneumothorax. No acute osseous pathology. IMPRESSION: Cardiomegaly with vascular congestion and edema. Electronically Signed   By: Vanetta Chou M.D.   On: 05/15/2024 13:16    Review of Systems  Constitutional:  Negative for chills and fever.  HENT:  Negative for ear discharge, hearing loss and sore throat.   Eyes:  Negative for discharge.  Respiratory:  Negative for cough and shortness of breath.   Cardiovascular:  Negative for chest pain and leg swelling.  Gastrointestinal:  Positive for abdominal pain. Negative for constipation, diarrhea, nausea and vomiting.  Musculoskeletal:  Negative for myalgias and neck pain.  Skin:  Negative for rash.  Allergic/Immunologic: Negative for environmental allergies.  Neurological:  Negative for dizziness and seizures.  Hematological:  Does not bruise/bleed easily.  Psychiatric/Behavioral:  Negative for suicidal ideas.   All other systems reviewed and are negative.  Blood pressure 106/67, pulse 85, temperature 98.4 F (36.9 C), temperature source Oral, resp. rate (!) 23, height 6' 3 (1.905 m), weight 99.6 kg, SpO2 100%. Physical Exam Constitutional:      Appearance: He is well-developed.     Comments: Conversant No acute distress  HENT:     Head: Normocephalic and atraumatic.  Eyes:     General: Lids are normal. No scleral icterus.    Pupils: Pupils are equal, round, and reactive to light.     Comments: Pupils are equal round and reactive No lid lag Moist conjunctiva  Neck:     Thyroid: No thyromegaly.     Trachea: No tracheal tenderness.     Comments: No cervical lymphadenopathy Cardiovascular:     Rate and Rhythm: Normal rate and regular rhythm.     Heart sounds: No murmur heard. Pulmonary:     Effort: Pulmonary effort is normal.     Breath sounds: Normal breath sounds. No wheezing or rales.  Abdominal:     General: There is distension.     Tenderness: There is generalized abdominal tenderness.     Hernia: No hernia  is present.  Musculoskeletal:     Cervical back: Normal range of motion and neck supple.  Skin:    General: Skin is warm.     Findings: No rash.     Nails: There is no clubbing.     Comments: Normal skin turgor  Neurological:     Mental Status: He is alert and oriented to person, place, and time.     Comments: Normal gait and station  Psychiatric:        Mood and Affect: Mood normal.        Thought Content: Thought content normal.        Judgment: Judgment normal.     Comments: Appropriate affect     Assessment/Plan: 68 year old male with multiple medical issues.  Concern for SBO. 1.  With began with SBO protocol.  Can avoid NG tube at this time but have patient drink the contrast orally.  If patient does have issues with nausea vomiting, patient would require NG tube placement. 2.  Otherwise keep NPO. 3.  Will follow along.  Lynda Leos 05/15/2024, 4:55 PM

## 2024-05-15 NOTE — ED Notes (Signed)
Bladder scan showed greater than 999 ml.

## 2024-05-15 NOTE — ED Notes (Signed)
 5C notified that pt will be transported to unit via transported.

## 2024-05-16 ENCOUNTER — Inpatient Hospital Stay (HOSPITAL_COMMUNITY)

## 2024-05-16 DIAGNOSIS — N320 Bladder-neck obstruction: Secondary | ICD-10-CM | POA: Diagnosis not present

## 2024-05-16 LAB — BASIC METABOLIC PANEL WITH GFR
Anion gap: 12 (ref 5–15)
BUN: 66 mg/dL — ABNORMAL HIGH (ref 8–23)
CO2: 20 mmol/L — ABNORMAL LOW (ref 22–32)
Calcium: 8.4 mg/dL — ABNORMAL LOW (ref 8.9–10.3)
Chloride: 112 mmol/L — ABNORMAL HIGH (ref 98–111)
Creatinine, Ser: 3.89 mg/dL — ABNORMAL HIGH (ref 0.61–1.24)
GFR, Estimated: 16 mL/min — ABNORMAL LOW (ref >60)
Glucose, Bld: 87 mg/dL (ref 70–99)
Potassium: 4.5 mmol/L (ref 3.5–5.1)
Sodium: 144 mmol/L (ref 135–145)

## 2024-05-16 LAB — CBC WITH DIFFERENTIAL/PLATELET
Abs Immature Granulocytes: 0.18 K/uL — ABNORMAL HIGH (ref 0.00–0.07)
Basophils Absolute: 0 K/uL (ref 0.0–0.1)
Basophils Relative: 0 %
Eosinophils Absolute: 0 K/uL (ref 0.0–0.5)
Eosinophils Relative: 0 %
HCT: 34.9 % — ABNORMAL LOW (ref 39.0–52.0)
Hemoglobin: 11.6 g/dL — ABNORMAL LOW (ref 13.0–17.0)
Immature Granulocytes: 1 %
Lymphocytes Relative: 4 %
Lymphs Abs: 1 K/uL (ref 0.7–4.0)
MCH: 31 pg (ref 26.0–34.0)
MCHC: 33.2 g/dL (ref 30.0–36.0)
MCV: 93.3 fL (ref 80.0–100.0)
Monocytes Absolute: 2.3 K/uL — ABNORMAL HIGH (ref 0.1–1.0)
Monocytes Relative: 10 %
Neutro Abs: 20.4 K/uL — ABNORMAL HIGH (ref 1.7–7.7)
Neutrophils Relative %: 85 %
Platelets: 394 K/uL (ref 150–400)
RBC: 3.74 MIL/uL — ABNORMAL LOW (ref 4.22–5.81)
RDW: 16.9 % — ABNORMAL HIGH (ref 11.5–15.5)
WBC: 23.9 K/uL — ABNORMAL HIGH (ref 4.0–10.5)
nRBC: 0 % (ref 0.0–0.2)

## 2024-05-16 LAB — URINE CULTURE: Culture: NO GROWTH

## 2024-05-16 MED ORDER — FINASTERIDE 5 MG PO TABS
5.0000 mg | ORAL_TABLET | Freq: Every day | ORAL | Status: DC
  Administered 2024-05-16 – 2024-05-19 (×4): 5 mg via ORAL
  Filled 2024-05-16 (×4): qty 1

## 2024-05-16 MED ORDER — SODIUM CHLORIDE 0.9 % IV SOLN: INTRAVENOUS | Status: AC

## 2024-05-16 MED ORDER — CHLORHEXIDINE GLUCONATE CLOTH 2 % EX PADS
6.0000 | MEDICATED_PAD | Freq: Every day | CUTANEOUS | Status: DC
  Administered 2024-05-16 – 2024-05-19 (×4): 6 via TOPICAL

## 2024-05-16 NOTE — Evaluation (Signed)
 Physical Therapy Evaluation Patient Details Name: Jordan Kennedy MRN: [id number] DOB: [birth year] Today's Date: 05/16/2024  History of Present Illness  68 y.o. male admitted 05/15/24 with abdominal discomfort, hypotension. Workup for AKI, bladder outlet obstruction, L hydroureteronephrosis, SBO; NGT placed but pt pulled out. PMH includes schizophrenia, intellectual disability, tardive dyskinesia, chronic constipation, ileus, SBO, BPH.   Clinical Impression  Pt presents with an overall decrease in functional mobility secondary to above. PTA, pt lives with caregiver; pt reports using walker to get around. Today, pt able to stand briefly with RW and CGA, though notable instability and quickly laying himself back down, adamant against further activity. Pt repeatedly asking to eat and drink despite multiple attempts to educate and redirect; states he will get up after he can eat; RN aware. Hopeful pt will progress well with mobility to allow for safe d/c home once he is agreeable to participate. Will follow acutely to address established goals.       If plan is discharge home, recommend the following: A little help with walking and/or transfers;A little help with bathing/dressing/bathroom;Assistance with cooking/housework;Assist for transportation;Help with stairs or ramp for entrance;Supervision due to cognitive status;Direct supervision/assist for medications management;Direct supervision/assist for financial management   Can travel by private vehicle    TBD    Equipment Recommendations None recommended by PT  Recommendations for Other Services       Functional Status Assessment Patient has had a recent decline in their functional status and demonstrates the ability to make significant improvements in function in a reasonable and predictable amount of time.     Precautions / Restrictions Precautions Precautions: Fall;Other (comment) Recall of Precautions/Restrictions:  Impaired Precaution/Restrictions Comments: foley Restrictions Weight Bearing Restrictions Per Provider Order: No      Mobility  Bed Mobility Overal bed mobility: Modified Independent                  Transfers Overall transfer level: Needs assistance Equipment used: Rolling walker (2 wheels) Transfers: Sit to/from Stand Sit to Stand: Contact guard assist           General transfer comment: multiple attempts to stand from EOB to RW, ultimately able to without assist, CGA for balance    Ambulation/Gait               General Gait Details: pt taking a couple side steps at EOB with RW, self-corrected posterior LOB, then pt laying himself back down and adamantly refuses to get back up (I want to eat and drink something)  Stairs            Wheelchair Mobility     Tilt Bed    Modified Rankin (Stroke Patients Only)       Balance Overall balance assessment: Needs assistance Sitting-balance support: No upper extremity supported Sitting balance-Leahy Scale: Fair Sitting balance - Comments: pt able to don bilateral socks long sitting then sitting EOB   Standing balance support: Single extremity supported, Bilateral upper extremity supported Standing balance-Leahy Scale: Poor Standing balance comment: using RW for brief static standing, posterior LOB taking side steps                             Pertinent Vitals/Pain Pain Assessment Pain Assessment: Faces Faces Pain Scale: No hurt Pain Intervention(s): Monitored during session    Home Living Family/patient expects to be discharged to:: Private residence Living Arrangements: Non-relatives/Friends Available Help at Discharge: Personal care attendant Type of Home: House  Additional Comments: pt states, I live with a guy. per MD note in chart (03/2024) and admission note, pt recently moved out of group home and currently lives with caregiver; attends adult day program     Prior Function Prior Level of Function : Needs assist;Patient poor historian/Family not available             Mobility Comments: pt reports using walker baseline ADLs Comments: suspect assist for majority of ADL/iADLs given baseline cognitive impairments     Extremity/Trunk Assessment   Upper Extremity Assessment Upper Extremity Assessment: Overall WFL for tasks assessed;Difficult to assess due to impaired cognition (functional observed strength)    Lower Extremity Assessment Lower Extremity Assessment: Generalized weakness;Difficult to assess due to impaired cognition (functional observed strength >3/5 though notable instability with standing, unsure if strength related)       Communication   Communication Communication: Impaired Factors Affecting Communication: Reduced clarity of speech (h/o tardive dyskinesia, speech slightly difficult to understand at times)    Cognition Arousal: Alert Behavior During Therapy: Restless   PT - Cognitive impairments: History of cognitive impairments                       PT - Cognition Comments: h/o schizophrenia, cognitive impairment. pt answering some questions himself, otherwise frequently repeating what therapist said. pt repeatedly asking to eat and drink in response to almost every question despite multiple attempts to redirect Following commands: Impaired Following commands impaired: Follows one step commands inconsistently     Cueing Cueing Techniques: Verbal cues, Gestural cues     General Comments General comments (skin integrity, edema, etc.): attempted education on role of acute PT and importance of OOB mobility, though limited by cognitive impairment and pt perseverating on wanting to eat/drink despite educ on NPO    Exercises     Assessment/Plan    PT Assessment Patient needs continued PT services  PT Problem List Decreased strength;Decreased activity tolerance;Decreased balance;Decreased mobility;Decreased  cognition;Decreased knowledge of use of DME;Decreased safety awareness       PT Treatment Interventions DME instruction;Gait training;Stair training;Functional mobility training;Therapeutic activities;Therapeutic exercise;Balance training;Patient/family education    PT Goals (Current goals can be found in the Care Plan section)  Acute Rehab PT Goals Patient Stated Goal: I want to eat something PT Goal Formulation: Patient unable to participate in goal setting Time For Goal Achievement: 05/30/24 Potential to Achieve Goals: Good    Frequency Min 2X/week     Co-evaluation               AM-PAC PT 6 Clicks Mobility  Outcome Measure Help needed turning from your back to your side while in a flat bed without using bedrails?: None Help needed moving from lying on your back to sitting on the side of a flat bed without using bedrails?: None Help needed moving to and from a bed to a chair (including a wheelchair)?: A Little Help needed standing up from a chair using your arms (e.g., wheelchair or bedside chair)?: A Little Help needed to walk in hospital room?: A Little Help needed climbing 3-5 steps with a railing? : A Lot 6 Click Score: 19    End of Session   Activity Tolerance: Other (comment) (limited by impaired cognition) Patient left: in bed;with call bell/phone within reach;with bed alarm set Nurse Communication: Mobility status PT Visit Diagnosis: Other abnormalities of gait and mobility (R26.89);Muscle weakness (generalized) (M62.81)    Time: 8480-8467 PT Time Calculation (min) (ACUTE ONLY): 13  min   Charges:   PT Evaluation $PT Eval Moderate Complexity: 1 Mod   PT General Charges $$ ACUTE PT VISIT: 1 Visit       Darice Almas, PT, DPT Acute Rehabilitation Services  Personal: Secure Chat Rehab Office: [phone]  Darice LITTIE Almas 05/16/2024, 4:30 PM

## 2024-05-16 NOTE — Plan of Care (Signed)

## 2024-05-16 NOTE — Progress Notes (Signed)
 Assessment & Plan: SBO - NG placed last PM - patient has pulled out - denies nausea, emesis, pain - will try CLD - will give gastrograffin po this AM and proceed with small bowel protocol - discussed with nurse - will follow - Foley in place per primary service        Krystal Spinner, MD [facility] practice Office: [phone]        Chief Complaint: SBO, urinary retention  Subjective: Patient in bed, denies pain.  Wants to eat.  NG pulled out and in bed.  Objective: Vital signs in last 24 hours: Temp:  [97.1 F (36.2 C)-98.4 F (36.9 C)] 97.8 F (36.6 C) (07/26 0752) Pulse Rate:  [80-93] 93 (07/26 0752) Resp:  [16-29] 18 (07/26 0752) BP: (86-124)/(46-81) 124/81 (07/26 0752) SpO2:  [85 %-100 %] 100 % (07/26 0752) Weight:  [99.6 kg] 99.6 kg (07/25 1222)    Intake/Output from previous day: 07/25 0701 - 07/26 0700 In: -  Out: 7650 [Urine:7650] Intake/Output this shift: No intake/output data recorded.  Physical Exam: HEENT - sclerae clear, mucous membranes moist Neck - soft Abdomen - soft, mild distension, non-tender  Lab Results:  Recent Labs    05/15/24 1227 05/15/24 1302 05/16/24 0333  WBC 27.7*  --  23.9*  HGB 11.3* 11.6* 11.6*  HCT 34.0* 34.0* 34.9*  PLT 400  --  394   BMET Recent Labs    05/15/24 1227 05/15/24 1302 05/16/24 0333  NA 137 140 144  K 4.6 4.7 4.5  CL 105 108 112*  CO2 21*  --  20*  GLUCOSE 116* 110* 87  BUN 93* 81* 66*  CREATININE 6.63* 6.70* 3.89*  CALCIUM  8.1*  --  8.4*   PT/INR Recent Labs    05/15/24 1327  LABPROT 16.6*  INR 1.3*   Comprehensive Metabolic Panel:    Component Value Date/Time   NA 144 05/16/2024 0333   NA 140 05/15/2024 1302   NA 143 05/09/2023 1146   NA 142 01/17/2023 1004   K 4.5 05/16/2024 0333   K 4.7 05/15/2024 1302   CL 112 (H) 05/16/2024 0333   CL 108 05/15/2024 1302   CO2 20 (L) 05/16/2024 0333   CO2 21 (L) 05/15/2024 1227   BUN 66 (H) 05/16/2024 0333    BUN 81 (H) 05/15/2024 1302   BUN 17 05/09/2023 1146   BUN 15 01/17/2023 1004   CREATININE 3.89 (H) 05/16/2024 0333   CREATININE 6.70 (H) 05/15/2024 1302   CREATININE 0.92 09/14/2015 1637   CREATININE 1.10 05/25/2015 1037   GLUCOSE 87 05/16/2024 0333   GLUCOSE 110 (H) 05/15/2024 1302   CALCIUM  8.4 (L) 05/16/2024 0333   CALCIUM  8.1 (L) 05/15/2024 1227   AST 29 05/15/2024 1227   AST 25 06/25/2023 1208   ALT 56 (H) 05/15/2024 1227   ALT 23 06/25/2023 1208   ALKPHOS 101 05/15/2024 1227   ALKPHOS 84 06/25/2023 1208   BILITOT 0.9 05/15/2024 1227   BILITOT 0.7 06/25/2023 1208   BILITOT 0.3 05/09/2023 1146   PROT 5.2 (L) 05/15/2024 1227   PROT 7.1 06/25/2023 1208   PROT 7.1 05/09/2023 1146   ALBUMIN 2.0 (L) 05/15/2024 1227   ALBUMIN 3.8 06/25/2023 1208   ALBUMIN 4.5 05/09/2023 1146    Studies/Results: DG Abdomen 1 View Result Date: 05/15/2024 CLINICAL DATA:  Abdominal pain. EXAM: ABDOMEN - 1 VIEW COMPARISON:  07/11/2018. Abdomen and pelvis CT dated 05/15/2024. Portable chest obtained earlier today. FINDINGS: Stable dilated,  gas and stool-filled transverse colon. There was some mesenteric swirling in the left mid abdomen on the recent CT. There was no bowel wall thickening or pneumatosis on the recent CT. Small amount of gas and stool in the rectum and sigmoid colon. Mild-to-moderate lumbar spine degenerative changes. Recently demonstrated enlarged heart and pulmonary edema. IMPRESSION: 1. Stable dilated, gas and stool-filled transverse colon. This could be due to a colonic ileus or a partial colonic obstruction due to an internal hernia. 2. No bowel wall thickening or pneumatosis on the recent CT. Electronically Signed   By: Elspeth Bathe M.D.   On: 05/15/2024 14:32   CT ABDOMEN PELVIS WO CONTRAST Result Date: 05/15/2024 CLINICAL DATA:  Generalized weakness and abdominal pain. EXAM: CT ABDOMEN AND PELVIS WITHOUT CONTRAST TECHNIQUE: Multidetector CT imaging of the abdomen and pelvis was  performed following the standard protocol without IV contrast. RADIATION DOSE REDUCTION: This exam was performed according to the departmental dose-optimization program which includes automated exposure control, adjustment of the mA and/or kV according to patient size and/or use of iterative reconstruction technique. COMPARISON:  07/10/2018 FINDINGS: Lower chest: Patchy airspace disease is seen in both lower lobes with small bilateral pleural effusions. Hepatobiliary: No suspicious focal abnormality in the liver on this study without intravenous contrast. There is no evidence for gallstones, gallbladder wall thickening, or pericholecystic fluid. Common bile duct measures 7 mm diameter, upper normal to mildly enlarged for patient age. Pancreas: No focal mass lesion. No dilatation of the main duct. No intraparenchymal cyst. No peripancreatic edema. Spleen: No splenomegaly. No suspicious focal mass lesion. Adrenals/Urinary Tract: No adrenal nodule or mass. Mild right hydroureteronephrosis with mild right perinephric edema. There is moderate left hydroureteronephrosis with fairly extensive left perinephric edema/inflammation/hemorrhage. Left ureteral fullness extends into the pelvis. Bladder is markedly distended. Stomach/Bowel: Stomach is decompressed. Duodenum is normally positioned as is the ligament of Treitz. No small bowel wall thickening. No small bowel dilatation. Non dependent transverse colon is dilated with gas and stool measuring up to about 7.5 cm maximum diameter. No colonic wall thickening. Vascular/Lymphatic: No abdominal aortic aneurysm. There is no gastrohepatic or hepatoduodenal ligament lymphadenopathy. No retroperitoneal or mesenteric lymphadenopathy. No pelvic sidewall lymphadenopathy. Reproductive: Prostate gland is enlarged. Other: Small volume free fluid is seen around the liver and spleen. Small volume fluid layers in the para colic gutters and anterior pelvis. Diffuse body wall edema noted in  the abdomen and pelvis. Musculoskeletal: No worrisome lytic or sclerotic osseous abnormality. IMPRESSION: 1. Moderate left hydroureteronephrosis with fairly extensive left perinephric edema/inflammation/hemorrhage. Assessment is limited by lack of intravenous contrast material. Left ureteral fullness extends into the pelvis. No obstructing calculus identified. Findings in the left retroperitoneal space may reflect urine leak/caliceal rupture, pyelonephritis/urinary tract infection, sequelae of neoplasm such as urothelial etiology. 2. Markedly distended urinary bladder likely contributes to appearance of the left hydroureteronephrosis. Associated prostatomegaly raises the question of bladder outlet obstruction. 3. Mild right hydroureteronephrosis with mild right perinephric edema. 4. Small volume free fluid in the abdomen and pelvis. 5. Diffuse body wall edema in the abdomen and pelvis. 6. Patchy airspace disease in both lower lobes with small bilateral pleural effusions. Imaging features suggest multifocal pneumonia. 7. Common bile duct measures 7 mm diameter, upper normal to mildly enlarged for patient age. Correlation with liver function tests recommended. Electronically Signed   By: Camellia Candle M.D.   On: 05/15/2024 13:56   DG Chest Port 1 View Result Date: 05/15/2024 CLINICAL DATA:  Shortness of breath. EXAM: PORTABLE CHEST 1  VIEW COMPARISON:  Chest radiograph dated 11/25/2023. FINDINGS: There is cardiomegaly with vascular congestion and edema. No focal consolidation, pleural effusion or pneumothorax. No acute osseous pathology. IMPRESSION: Cardiomegaly with vascular congestion and edema. Electronically Signed   By: Vanetta Chou M.D.   On: 05/15/2024 13:16      Krystal Spinner 05/16/2024  Patient ID: Jordan Kennedy, male   DOB: [birth year], 68 y.o.   MRN: [id number]

## 2024-05-16 NOTE — Progress Notes (Signed)
 PROGRESS NOTE Jordan Kennedy  FMW:[id number] DOB: Aug 07, 1956 DOA: 05/15/2024 PCP: [name] HERO, MD  Brief Narrative/Hospital Course: 32 yom w/ schizophrenia, SBO, BPH, who was in USOH until having abdomen discomfort 7/25 morning when he woke up and activated EMS. In the ED vitals hypotension tachypnea labs with creatinine 6.78 leukocytosis 27.2, and lactate 1.1. CT abd/pelvis>>left hydroureteronephrosis with fairly extensive left perinephric  edema/inflammation/hemorrhage w/ L hydroureteronephrosis and prostatomegaly. EDP placed foley, started IV cefepime /vanc), consulted urology, and was admitted for acute renal failure with bladder outlet obstruction and left hydroureteronephrosis.  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  Creatinine improving nicely keep the Foley catheter on, continue IV fluid hydration, monitor renal functions avoid nephrotoxic medication.  Flomax  on hold, PSA level elevated at 11> defer to urology Recent Labs    06/25/23 1208 06/26/23 1023 11/25/23 0949 05/15/24 1227 05/15/24 1302 05/16/24 0333  BUN 18 18 15  93* 81* 66*  CREATININE 1.63* 1.47* 1.23 6.63* 6.70* 3.89*  CO2 23 22 22  21*  --  20*  K 3.5 3.8 4.1 4.6 4.7 4.5   Concern for SBO Nausea and vomiting: Surgery following on SBO protocol NG tube removed, okay to hold off for surgery.  Adding clear liquid diet.  Follow-up imaging Gastrografin  and small bowel protocol. Continue IV fluids    Schizophrenia PTA clozapine  CONT    Constipation PTA senna/docusate 2 tabs BID and miralax  17g BID on hold  Significant leukocytosis: Unclear etiology UA WBC 20-50, urine culture blood culture pending, antibiotics on hold.  DVT prophylaxis: SCDs Start: 05/15/24 1452 Code Status:   Code Status: Full Code Family Communication: plan of care discussed with patient at bedside. Patient status is: Remains hospitalized because of severity of illness Level of care: Med-Surg   Dispo: The patient is from: group home            Anticipated disposition: TBD Objective: Vitals last 24 hrs: Vitals:   05/15/24 1623 05/15/24 2001 05/16/24 0356 05/16/24 0752  BP:  104/73 111/75 124/81  Pulse:  82 89 93  Resp:  16 16 18   Temp: 98.4 F (36.9 C) 97.8 F (36.6 C) 98.1 F (36.7 C) 97.8 F (36.6 C)  TempSrc: Oral Oral Oral   SpO2:  93% 95% 100%  Weight:      Height:        Physical Examination: General exam: alert awake, older than stated age HEENT:Oral mucosa moist, Ear/Nose WNL grossly Respiratory system: Bilaterally clear BS, no use of accessory muscle Cardiovascular system: S1 & S2 + Gastrointestinal system: Abdomen soft, NT,ND Nervous System: Alert, awake, following commands. Extremities: LE edema neg, warm extremities Skin: No rashes,warm. MSK: Normal muscle bulk/tone.     Data Reviewed: I have personally reviewed following labs and imaging studies ( see epic result tab) CBC: Recent Labs  Lab 05/15/24 1227 05/15/24 1302 05/16/24 0333  WBC 27.7*  --  23.9*  NEUTROABS  --   --  20.4*  HGB 11.3* 11.6* 11.6*  HCT 34.0* 34.0* 34.9*  MCV 94.4  --  93.3  PLT 400  --  394  CMP: Recent Labs  Lab 05/15/24 1227 05/15/24 1302 05/16/24 0333  NA 137 140 144  K 4.6 4.7 4.5  CL 105 108 112*  CO2 21*  --  20*  GLUCOSE 116* 110* 87  BUN 93* 81* 66*  CREATININE 6.63* 6.70* 3.89*  CALCIUM  8.1*  --  8.4*   GFR: Estimated Creatinine Clearance: 22.6 mL/min (A) (by C-G formula based on SCr of 3.89 mg/dL (H)). Recent  Labs  Lab 05/15/24 1227  AST 29  ALT 56*  ALKPHOS 101  BILITOT 0.9  PROT 5.2*  ALBUMIN 2.0*    Recent Labs  Lab 05/15/24 1227  LIPASE 34   No results for input(s): AMMONIA in the last 168 hours. Coagulation Profile:  Recent Labs  Lab 05/15/24 1327  INR 1.3*   Unresulted Labs (From admission, onward)     Start     Ordered   05/15/24 1505  Clozapine  (clozaril )  Once,   R        05/15/24 1505   05/15/24 1504  Urine Culture  Add-on,   AD       Question:  Indication  Answer:  Dysuria   05/15/24 1503   05/15/24 1258  Blood Culture (routine x 2)  (Septic presentation on arrival (screening labs, nursing and treatment orders for obvious sepsis))  BLOOD CULTURE X 2,   STAT      05/15/24 1257   05/15/24 1226  Urine Culture  Once,   R        05/15/24 1226           Antimicrobials/Microbiology: Anti-infectives (From admission, onward)    Start     Dose/Rate Route Frequency Ordered Stop   05/15/24 1300  ceFEPIme  (MAXIPIME ) 2 g in sodium chloride  0.9 % 100 mL IVPB        2 g 200 mL/hr over 30 Minutes Intravenous  Once 05/15/24 1257 05/15/24 1427   05/15/24 1300  metroNIDAZOLE  (FLAGYL ) IVPB 500 mg        500 mg 100 mL/hr over 60 Minutes Intravenous  Once 05/15/24 1257 05/15/24 1504   05/15/24 1300  vancomycin  (VANCOCIN ) IVPB 1000 mg/200 mL premix  Status:  Discontinued        1,000 mg 200 mL/hr over 60 Minutes Intravenous  Once 05/15/24 1257 05/15/24 1258   05/15/24 1300  vancomycin  (VANCOREADY) IVPB 2000 mg/400 mL        2,000 mg 200 mL/hr over 120 Minutes Intravenous  Once 05/15/24 1258 05/15/24 1606         Component Value Date/Time   SDES URINE, CLEAN CATCH 06/25/2023 1207   SPECREQUEST NONE 06/25/2023 1207   CULT (A) 06/25/2023 1207    <10,000 COLONIES/mL INSIGNIFICANT GROWTH Performed at [facility], [address], [place], [place] [address]    REPTSTATUS 06/26/2023 FINAL 06/25/2023 1207    Procedures:  Medications reviewed:  Scheduled Meds:   amantadine   100 mg Oral QHS   cloZAPine   200 mg Oral QHS   linaclotide   290 mcg Oral QAC breakfast   melatonin  10 mg Oral QHS   metoprolol  succinate  12.5 mg Oral Daily   polyethylene glycol  17 g Oral BID   senna-docusate  2 tablet Oral BID   tamsulosin   0.4 mg Oral QPM   Continuous Infusions:  Mennie LAMY, MD [facility] 05/16/2024, 10:50 AM

## 2024-05-16 NOTE — Hospital Course (Addendum)
 67 yom w/ schizophrenia, SBO, BPH, who was in USOH until having abdomen discomfort 7/25 morning when he woke up and activated EMS. In the ED vitals hypotension tachypnea labs with creatinine 6.78 leukocytosis 27.2, and lactate 1.1. CT abd/pelvis>>left hydroureteronephrosis with fairly extensive left perinephric  edema/inflammation/hemorrhage w/ L hydroureteronephrosis and prostatomegaly. EDP placed foley, started IV cefepime /vanc), consulted urology, and was admitted for acute renal failure with bladder outlet obstruction and left hydroureteronephrosis. NG tube placed on admission but patient removed.  Foley catheter continued Patient has clinically stabilized.  Diet is started being advanced.  Creatinine has been coming down nicely Subjective: SEEN EXAMINED Resting comfortably no complaints Overnight afebrile BP stable Labs with creatinine down to 1.9 BUN 23 Having Bms.  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  Appreciate urology input. Flomax  on hold, PSA level elevated at 11> defer to urology: Who advised to do voiding trial in 5 days but patient is being discharged in 2 days so he will need to follow-up with Dr.Eskridge in few days for Foley removal.  Give gentle IV fluids overnight Recent Labs    06/25/23 1208 06/26/23 1023 11/25/23 0949 05/15/24 1227 05/15/24 1302 05/16/24 0333 05/17/24 0512 05/18/24 0540  BUN 18 18 15  93* 81* 66* 33* 23  CREATININE 1.63* 1.47* 1.23 6.63* 6.70* 3.89* 2.01* 1.93*  CO2 23 22 22  21*  --  20* 22 20*  K 3.5 3.8 4.1 4.6 4.7 4.5 4.2 4.2   SBO Nausea and vomiting: Surgery following on SBO protocol>NG tube removed> diet has been started 7/26> on full liquid diet 7/27> to soft today x-ray now shows contrast in  the colon and rectum persistent but improved colonic dilatation  Schizophrenia Continue clozapine    Constipation Resume home senna/docusate 2 tabs BID and miralax  17g BID as able, per CCS  Significant leukocytosis: Unclear etiology UA WBC 20-50, urine culture blood culture pending, antibiotics on hold.  Now improving likely reactive-keeping fluids overnight Recent Labs  Lab 05/15/24 1227 05/16/24 0333 05/17/24 0512 05/18/24 0540  WBC 27.7* 23.9* 19.0* 19.9*    DVT prophylaxis: SCDs Start: 05/15/24 1452 Code Status:   Code Status: Full Code Family Communication: plan of care discussed with patient at bedside. Patient status is: Remains hospitalized because of severity of illness Level of care: Med-Surg   Dispo: The patient is from: group home            Anticipated disposition: Anticipating discharge in am  Objective: Vitals last 24 hrs: Vitals:   05/17/24 1941 05/18/24 0414 05/18/24 0759 05/18/24 0807  BP: 112/71 125/85 (!) 107/93 (!) 154/81  Pulse: 94 100 92 81  Resp:  16 19 19   Temp: 99.2 F (37.3 C) 98.9 F (37.2 C) 98.7 F (37.1 C) 98.6 F (37 C)  TempSrc: Oral Oral    SpO2: 99% 96% 94% 99%  Weight:      Height:        Physical Examination: General exam: alert awake, oriented at baseline, older than stated age HEENT:Oral mucosa moist, Ear/Nose WNL grossly Respiratory system: Bilaterally clear BS,no use of accessory muscle Cardiovascular system: S1 & S2 +, No JVD. Gastrointestinal system: Abdomen soft,NT,ND, BS+ Nervous System: Alert, awake, moving all extremities,and following commands. Extremities: LE edema neg,distal peripheral pulses palpable and warm.  Skin: No rashes,no icterus. MSK: Normal muscle bulk,tone, power

## 2024-05-17 DIAGNOSIS — N320 Bladder-neck obstruction: Secondary | ICD-10-CM | POA: Diagnosis not present

## 2024-05-17 LAB — BASIC METABOLIC PANEL WITH GFR
Anion gap: 10 (ref 5–15)
BUN: 33 mg/dL — ABNORMAL HIGH (ref 8–23)
CO2: 22 mmol/L (ref 22–32)
Calcium: 8.1 mg/dL — ABNORMAL LOW (ref 8.9–10.3)
Chloride: 113 mmol/L — ABNORMAL HIGH (ref 98–111)
Creatinine, Ser: 2.01 mg/dL — ABNORMAL HIGH (ref 0.61–1.24)
GFR, Estimated: 36 mL/min — ABNORMAL LOW (ref >60)
Glucose, Bld: 115 mg/dL — ABNORMAL HIGH (ref 70–99)
Potassium: 4.2 mmol/L (ref 3.5–5.1)
Sodium: 145 mmol/L (ref 135–145)

## 2024-05-17 LAB — CBC
HCT: 35.4 % — ABNORMAL LOW (ref 39.0–52.0)
Hemoglobin: 11.7 g/dL — ABNORMAL LOW (ref 13.0–17.0)
MCH: 31.1 pg (ref 26.0–34.0)
MCHC: 33.1 g/dL (ref 30.0–36.0)
MCV: 94.1 fL (ref 80.0–100.0)
Platelets: 438 K/uL — ABNORMAL HIGH (ref 150–400)
RBC: 3.76 MIL/uL — ABNORMAL LOW (ref 4.22–5.81)
RDW: 17.1 % — ABNORMAL HIGH (ref 11.5–15.5)
WBC: 19 K/uL — ABNORMAL HIGH (ref 4.0–10.5)
nRBC: 0 % (ref 0.0–0.2)

## 2024-05-17 NOTE — Plan of Care (Signed)
  Problem: Education: Goal: Knowledge of General Education information will improve Description: Including pain rating scale, medication(s)/side effects and non-pharmacologic comfort measures Outcome: Progressing   Problem: Health Behavior/Discharge Planning: Goal: Ability to manage health-related needs will improve Outcome: Progressing   Problem: Clinical Measurements: Goal: Ability to maintain clinical measurements within normal limits will improve Outcome: Progressing Goal: Will remain free from infection Outcome: Progressing Goal: Diagnostic test results will improve Outcome: Progressing Goal: Respiratory complications will improve Outcome: Progressing Goal: Cardiovascular complication will be avoided Outcome: Progressing   Problem: Elimination: Goal: Will not experience complications related to bowel motility Outcome: Progressing Goal: Will not experience complications related to urinary retention Outcome: Progressing   Problem: Coping: Goal: Level of anxiety will decrease Outcome: Progressing   Problem: Pain Managment: Goal: General experience of comfort will improve and/or be controlled Outcome: Progressing   Problem: Skin Integrity: Goal: Risk for impaired skin integrity will decrease Outcome: Progressing

## 2024-05-17 NOTE — Evaluation (Signed)
 Occupational Therapy Evaluation Patient Details Name: Jordan Kennedy MRN: [id number] DOB: February 15, 1956 Today's Date: 05/17/2024   History of Present Illness   68 y.o. male admitted 05/15/24 with abdominal discomfort, hypotension. Workup for AKI, bladder outlet obstruction, L hydroureteronephrosis, SBO; NGT placed but pt pulled out. PMH includes schizophrenia, intellectual disability, tardive dyskinesia, chronic constipation, ileus, SBO, BPH.     Clinical Impressions Per chart review, pt living with caregiver. Pt inconsistent with report of PLOF, unsure if pt uses RW, assume assist for ADLs. Pt currently needing min-mod A for ADLs, mod I for bed mobility and CGA-min A for transfers with 1 person HHA as pt abandons RW. Pt with mild unsteadiness, reaches out for bed rail for stability when walking around bed. Pt perseverative on asking for a sprite throughout session. Pt presenting with impairments listed below, will follow acutely. Recommend HHOT at d/c pending progression.     If plan is discharge home, recommend the following:   A little help with walking and/or transfers;A little help with bathing/dressing/bathroom;Assistance with cooking/housework;Direct supervision/assist for medications management;Direct supervision/assist for financial management;Assist for transportation;Help with stairs or ramp for entrance;Supervision due to cognitive status     Functional Status Assessment   Patient has had a recent decline in their functional status and demonstrates the ability to make significant improvements in function in a reasonable and predictable amount of time.     Equipment Recommendations   Other (comment) (RW)     Recommendations for Other Services   PT consult     Precautions/Restrictions   Precautions Precautions: Fall;Other (comment) Recall of Precautions/Restrictions: Impaired Precaution/Restrictions Comments: foley Restrictions Weight Bearing Restrictions Per  Provider Order: No     Mobility Bed Mobility Overal bed mobility: Modified Independent                  Transfers Overall transfer level: Needs assistance Equipment used: Rolling walker (2 wheels), 1 person hand held assist Transfers: Sit to/from Stand Sit to Stand: Contact guard assist           General transfer comment: min A for HHA around bed to chair, pt does not use RW despite cues      Balance Overall balance assessment: Needs assistance Sitting-balance support: No upper extremity supported Sitting balance-Leahy Scale: Good Sitting balance - Comments: dons socks sitting EOB   Standing balance support: Single extremity supported, Bilateral upper extremity supported Standing balance-Leahy Scale: Fair Standing balance comment: mild unsteadiness with ambulation around bed to chair                           ADL either performed or assessed with clinical judgement   ADL Overall ADL's : Needs assistance/impaired Eating/Feeding: Set up;Sitting   Grooming: Set up;Sitting   Upper Body Bathing: Minimal assistance;Standing   Lower Body Bathing: Moderate assistance;Sitting/lateral leans;Sit to/from stand   Upper Body Dressing : Minimal assistance;Sitting;Standing   Lower Body Dressing: Moderate assistance;Sitting/lateral leans;Sit to/from stand   Toilet Transfer: Minimal assistance;Ambulation;Regular Toilet   Toileting- Clothing Manipulation and Hygiene: Minimal assistance       Functional mobility during ADLs: Minimal assistance;Rolling walker (2 wheels)       Vision   Additional Comments: not formally assessed     Perception Perception: Not tested       Praxis Praxis: Not tested       Pertinent Vitals/Pain Pain Assessment Pain Assessment: No/denies pain     Extremity/Trunk Assessment Upper Extremity Assessment Upper Extremity Assessment: Overall  WFL for tasks assessed   Lower Extremity Assessment Lower Extremity Assessment:  Defer to PT evaluation   Cervical / Trunk Assessment Cervical / Trunk Assessment: Normal   Communication Communication Communication: Impaired Factors Affecting Communication: Reduced clarity of speech (h/o tardive dyskinesia, speech slightly difficult to understand at times)   Cognition Arousal: Alert Behavior During Therapy: Restless Cognition: No family/caregiver present to determine baseline             OT - Cognition Comments: impaired memory, continues to ask for sprite/ice cream despite being assured therapist would get it for him at end of session, provides minimal PLOF questions but difficulty obtaining home set up info from pt                 Following commands: Impaired Following commands impaired: Follows one step commands inconsistently     Cueing  General Comments   Cueing Techniques: Verbal cues;Gestural cues  VSS   Exercises     Shoulder Instructions      Home Living Family/patient expects to be discharged to:: Private residence Living Arrangements: Non-relatives/Friends Available Help at Discharge: Personal care attendant Type of Home: House                           Additional Comments: pt states, I live with a guy. per MD note in chart (03/2024) and admission note, pt recently moved out of group home and currently lives with caregiver; attends adult day program      Prior Functioning/Environment Prior Level of Function : Needs assist;Patient poor historian/Family not available             Mobility Comments: inconsistent report of RW use at baseline ADLs Comments: suspect assist for majority of ADL/iADLs given baseline cognitive impairments    OT Problem List: Decreased strength;Decreased range of motion;Decreased activity tolerance;Impaired balance (sitting and/or standing);Decreased cognition;Decreased knowledge of use of DME or AE   OT Treatment/Interventions: Self-care/ADL training;Therapeutic exercise;DME and/or AE  instruction;Therapeutic activities;Patient/family education;Balance training      OT Goals(Current goals can be found in the care plan section)   Acute Rehab OT Goals Patient Stated Goal: none stated OT Goal Formulation: With patient Time For Goal Achievement: 05/31/24 Potential to Achieve Goals: Good ADL Goals Pt Will Perform Upper Body Dressing: with modified independence;sitting Pt Will Perform Lower Body Dressing: with modified independence;sitting/lateral leans;sit to/from stand Pt Will Transfer to Toilet: with modified independence;ambulating;regular height toilet Pt Will Perform Tub/Shower Transfer: Tub transfer;Shower transfer;with modified independence;ambulating   OT Frequency:  Min 2X/week    Co-evaluation              AM-PAC OT 6 Clicks Daily Activity     Outcome Measure Help from another person eating meals?: A Little Help from another person taking care of personal grooming?: A Little Help from another person toileting, which includes using toliet, bedpan, or urinal?: A Little Help from another person bathing (including washing, rinsing, drying)?: A Lot Help from another person to put on and taking off regular upper body clothing?: A Little Help from another person to put on and taking off regular lower body clothing?: A Little 6 Click Score: 17   End of Session Equipment Utilized During Treatment: Gait belt Nurse Communication: Mobility status  Activity Tolerance: Patient tolerated treatment well Patient left: in chair;with call bell/phone within reach;with chair alarm set  OT Visit Diagnosis: Unsteadiness on feet (R26.81);Other abnormalities of gait and mobility (R26.89);Muscle weakness (generalized) (M62.81)  Time: 8686-8671 OT Time Calculation (min): 15 min Charges:  OT General Charges $OT Visit: 1 Visit OT Evaluation $OT Eval Low Complexity: 1 Low  Laneta POUR, OTD, OTR/L SecureChat Preferred Acute Rehab [phone]   Laneta POUR [place] 05/17/2024, 1:43 PM

## 2024-05-17 NOTE — Progress Notes (Signed)
 PROGRESS NOTE Jordan Kennedy  FMW:[id number] DOB: 1956/10/12 DOA: 05/15/2024 PCP: [name] HERO, MD  Brief Narrative/Hospital Course: 29 yom w/ schizophrenia, SBO, BPH, who was in [facility] until having abdomen discomfort 7/25 morning when he woke up and activated EMS. In the ED vitals hypotension tachypnea labs with creatinine 6.78 leukocytosis 27.2, and lactate 1.1. CT abd/pelvis>>left hydroureteronephrosis with fairly extensive left perinephric  edema/inflammation/hemorrhage w/ L hydroureteronephrosis and prostatomegaly. EDP placed foley, started IV cefepime /vanc), consulted urology, and was admitted for acute renal failure with bladder outlet obstruction and left hydroureteronephrosis. NG tube placed on admission but patient removed.  Foley catheter continued  Subjective: Seen examined Overnight afebrile BP stable  Labs shows further improving renal function WBC downtrending-without antibiotics. Wants to drink No BM yet. No nausea and vomiting.  Assessment and plan:  Bladder outlet obstruction BPH with median lobe, bladder invasion Left hydroureteronephrosis Obstructive nephropathy with AKI: Range of motion continues to improve, continue Foley catheter -he will need to continue on that and follow-up with urology as outpatient.  Encourage p.o., wean off IV fluids. Flomax  on hold, PSA level elevated at 11> defer to urology Recent Labs    06/25/23 1208 06/26/23 1023 11/25/23 0949 05/15/24 1227 05/15/24 1302 05/16/24 0333 05/17/24 0512  BUN 18 18 15  93* 81* 66* 33*  CREATININE 1.63* 1.47* 1.23 6.63* 6.70* 3.89* 2.01*  CO2 23 22 22  21*  --  20* 22  K 3.5 3.8 4.1 4.6 4.7 4.5 4.2   SBO Nausea and vomiting: Surgery following on SBO protocol>NG tube removed> diet has been started 7/26 advance as tolerated to FLD today x-ray now shows contrast in the colon and rectum persistent but improved colonic dilatation  Schizophrenia Continue clozapine    Constipation Resume home  senna/docusate 2 tabs BID and miralax  17g BID as able, per CCS  Significant leukocytosis: Unclear etiology UA WBC 20-50, urine culture blood culture pending, antibiotics on hold.  Now improving likely reactive  DVT prophylaxis: SCDs Start: 05/15/24 1452 Code Status:   Code Status: Full Code Family Communication: plan of care discussed with patient at bedside. Patient status is: Remains hospitalized because of severity of illness Level of care: Med-Surg   Dispo: The patient is from: group home            Anticipated disposition: TBD 1-2 days Objective: Vitals last 24 hrs: Vitals:   05/16/24 1601 05/16/24 1947 05/17/24 0401 05/17/24 0847  BP: (!) 125/93 114/76 (!) 112/57 108/80  Pulse: 95 93 95 (!) 124  Resp: 19 16 16    Temp: 98.4 F (36.9 C) 97.9 F (36.6 C) 99.4 F (37.4 C) 99.6 F (37.6 C)  TempSrc: Oral Oral Oral Oral  SpO2: 96% 97% 98% 94%  Weight:      Height:        Physical Examination: General exam: alert awake, oriented pleasant HEENT:Oral mucosa moist, Ear/Nose WNL grossly Respiratory system: Bilaterally clear BS, no use of accessory muscle Cardiovascular system: S1 & S2 + Gastrointestinal system: Abdomen soft, NT, distended. Nervous System: Alert, awake, following commands. Extremities: LE edema neg, warm extremities Skin: No rashes,warm. MSK: Normal muscle bulk/tone.     Data Reviewed: I have personally reviewed following labs and imaging studies ( see epic result tab) CBC: Recent Labs  Lab 05/15/24 1227 05/15/24 1302 05/16/24 0333 05/17/24 0512  WBC 27.7*  --  23.9* 19.0*  NEUTROABS  --   --  20.4*  --   HGB 11.3* 11.6* 11.6* 11.7*  HCT 34.0* 34.0* 34.9* 35.4*  MCV  94.4  --  93.3 94.1  PLT 400  --  394 438*   CMP: Recent Labs  Lab 05/15/24 1227 05/15/24 1302 05/16/24 0333 05/17/24 0512  NA 137 140 144 145  K 4.6 4.7 4.5 4.2  CL 105 108 112* 113*  CO2 21*  --  20* 22  GLUCOSE 116* 110* 87 115*  BUN 93* 81* 66* 33*  CREATININE 6.63*  6.70* 3.89* 2.01*  CALCIUM  8.1*  --  8.4* 8.1*   GFR: Estimated Creatinine Clearance: 43.8 mL/min (A) (by C-G formula based on SCr of 2.01 mg/dL (H)). Recent Labs  Lab 05/15/24 1227  AST 29  ALT 56*  ALKPHOS 101  BILITOT 0.9  PROT 5.2*  ALBUMIN 2.0*    Recent Labs  Lab 05/15/24 1227  LIPASE 34   No results for input(s): AMMONIA in the last 168 hours. Coagulation Profile:  Recent Labs  Lab 05/15/24 1327  INR 1.3*   Unresulted Labs (From admission, onward)     Start     Ordered   05/17/24 0500  Basic metabolic panel with GFR  Daily,   R      05/16/24 1051   05/17/24 0500  CBC  Daily,   R      05/16/24 1051   05/15/24 1505  Clozapine  (clozaril )  Once,   R        05/15/24 1505   05/15/24 1504  Urine Culture  Add-on,   AD       Question:  Indication  Answer:  Dysuria   05/15/24 1503           Antimicrobials/Microbiology: Anti-infectives (From admission, onward)    Start     Dose/Rate Route Frequency Ordered Stop   05/15/24 1300  ceFEPIme  (MAXIPIME ) 2 g in sodium chloride  0.9 % 100 mL IVPB        2 g 200 mL/hr over 30 Minutes Intravenous  Once 05/15/24 1257 05/15/24 1427   05/15/24 1300  metroNIDAZOLE  (FLAGYL ) IVPB 500 mg        500 mg 100 mL/hr over 60 Minutes Intravenous  Once 05/15/24 1257 05/15/24 1504   05/15/24 1300  vancomycin  (VANCOCIN ) IVPB 1000 mg/200 mL premix  Status:  Discontinued        1,000 mg 200 mL/hr over 60 Minutes Intravenous  Once 05/15/24 1257 05/15/24 1258   05/15/24 1300  vancomycin  (VANCOREADY) IVPB 2000 mg/400 mL        2,000 mg 200 mL/hr over 120 Minutes Intravenous  Once 05/15/24 1258 05/15/24 1606         Component Value Date/Time   SDES BLOOD LEFT HAND 05/15/2024 1404   SPECREQUEST  05/15/2024 1404    AEROBIC BOTTLE ONLY Blood Culture results may not be optimal due to an inadequate volume of blood received in culture bottles   CULT  05/15/2024 1404    NO GROWTH 2 DAYS Performed at [facility], [address]  [place], [place] [address]    REPTSTATUS PENDING 05/15/2024 1404    Procedures:  Medications reviewed:  Scheduled Meds:  amantadine   100 mg Oral QHS   Chlorhexidine  Gluconate Cloth  6 each Topical Daily   cloZAPine   200 mg Oral QHS   finasteride   5 mg Oral Daily   linaclotide   290 mcg Oral QAC breakfast   melatonin  10 mg Oral QHS   metoprolol  succinate  12.5 mg Oral Daily   polyethylene glycol  17 g Oral BID   senna-docusate  2  tablet Oral BID   tamsulosin   0.4 mg Oral QPM   Continuous Infusions:  sodium chloride  100 mL/hr at 05/16/24 2314    Mennie LAMY, MD [facility] 05/17/2024, 10:40 AM

## 2024-05-17 NOTE — Progress Notes (Signed)
 Assessment & Plan: SBO - tolerating CLD, two BM's recorded - gastrograffin po now in colon on AXR - will advance to full liquid diet this AM, soft diet later today  Encouraged OOB, ambulation.  Will follow.        Krystal Spinner, MD [facility] practice Office: [phone]        Chief Complaint: SBO  Subjective: Patient in bed, comfortable.  Asking for Sprite.  Denies nausea of emesis.  States loose BM's.  Objective: Vital signs in last 24 hours: Temp:  [97.9 F (36.6 C)-99.6 F (37.6 C)] 99.6 F (37.6 C) (07/27 0847) Pulse Rate:  [93-124] 124 (07/27 0847) Resp:  [16-19] 16 (07/27 0401) BP: (108-125)/(57-93) 108/80 (07/27 0847) SpO2:  [94 %-98 %] 94 % (07/27 0847) Last BM Date : 05/16/24  Intake/Output from previous day: 07/26 0701 - 07/27 0700 In: 919.2 [I.V.:919.2] Out: 3500 [Urine:3500] Intake/Output this shift: No intake/output data recorded.  Physical Exam: HEENT - sclerae clear, mucous membranes moist Abdomen - soft, mild distension; non-tender Ext - no edema, non-tender  Lab Results:  Recent Labs    05/16/24 0333 05/17/24 0512  WBC 23.9* 19.0*  HGB 11.6* 11.7*  HCT 34.9* 35.4*  PLT 394 438*   BMET Recent Labs    05/16/24 0333 05/17/24 0512  NA 144 145  K 4.5 4.2  CL 112* 113*  CO2 20* 22  GLUCOSE 87 115*  BUN 66* 33*  CREATININE 3.89* 2.01*  CALCIUM  8.4* 8.1*   PT/INR Recent Labs    05/15/24 1327  LABPROT 16.6*  INR 1.3*   Comprehensive Metabolic Panel:    Component Value Date/Time   NA 145 05/17/2024 0512   NA 144 05/16/2024 0333   NA 143 05/09/2023 1146   NA 142 01/17/2023 1004   K 4.2 05/17/2024 0512   K 4.5 05/16/2024 0333   CL 113 (H) 05/17/2024 0512   CL 112 (H) 05/16/2024 0333   CO2 22 05/17/2024 0512   CO2 20 (L) 05/16/2024 0333   BUN 33 (H) 05/17/2024 0512   BUN 66 (H) 05/16/2024 0333   BUN 17 05/09/2023 1146   BUN 15 01/17/2023 1004   CREATININE 2.01 (H) 05/17/2024 0512    CREATININE 3.89 (H) 05/16/2024 0333   CREATININE 0.92 09/14/2015 1637   CREATININE 1.10 05/25/2015 1037   GLUCOSE 115 (H) 05/17/2024 0512   GLUCOSE 87 05/16/2024 0333   CALCIUM  8.1 (L) 05/17/2024 0512   CALCIUM  8.4 (L) 05/16/2024 0333   AST 29 05/15/2024 1227   AST 25 06/25/2023 1208   ALT 56 (H) 05/15/2024 1227   ALT 23 06/25/2023 1208   ALKPHOS 101 05/15/2024 1227   ALKPHOS 84 06/25/2023 1208   BILITOT 0.9 05/15/2024 1227   BILITOT 0.7 06/25/2023 1208   BILITOT 0.3 05/09/2023 1146   PROT 5.2 (L) 05/15/2024 1227   PROT 7.1 06/25/2023 1208   PROT 7.1 05/09/2023 1146   ALBUMIN 2.0 (L) 05/15/2024 1227   ALBUMIN 3.8 06/25/2023 1208   ALBUMIN 4.5 05/09/2023 1146    Studies/Results: DG Abd Portable 1V-Small Bowel Obstruction Protocol-initial, 8 hr delay Result Date: 05/16/2024 CLINICAL DATA:  8 hour small-bowel follow-up film EXAM: PORTABLE ABDOMEN - 1 VIEW COMPARISON:  Plain film from the previous day. FINDINGS: Administered contrast now lies within the colon and rectum. Persistent but improved gaseous distension of the colon is noted. No free air is seen. No bony abnormality is noted. IMPRESSION: Administered contrast now lies within the colon and  rectum. Persistent but improved colonic dilatation is noted. Electronically Signed   By: Oneil Devonshire M.D.   On: 05/16/2024 19:44   DG Abdomen 1 View Result Date: 05/15/2024 CLINICAL DATA:  Abdominal pain. EXAM: ABDOMEN - 1 VIEW COMPARISON:  07/11/2018. Abdomen and pelvis CT dated 05/15/2024. Portable chest obtained earlier today. FINDINGS: Stable dilated, gas and stool-filled transverse colon. There was some mesenteric swirling in the left mid abdomen on the recent CT. There was no bowel wall thickening or pneumatosis on the recent CT. Small amount of gas and stool in the rectum and sigmoid colon. Mild-to-moderate lumbar spine degenerative changes. Recently demonstrated enlarged heart and pulmonary edema. IMPRESSION: 1. Stable dilated, gas and  stool-filled transverse colon. This could be due to a colonic ileus or a partial colonic obstruction due to an internal hernia. 2. No bowel wall thickening or pneumatosis on the recent CT. Electronically Signed   By: Elspeth Bathe M.D.   On: 05/15/2024 14:32   CT ABDOMEN PELVIS WO CONTRAST Result Date: 05/15/2024 CLINICAL DATA:  Generalized weakness and abdominal pain. EXAM: CT ABDOMEN AND PELVIS WITHOUT CONTRAST TECHNIQUE: Multidetector CT imaging of the abdomen and pelvis was performed following the standard protocol without IV contrast. RADIATION DOSE REDUCTION: This exam was performed according to the departmental dose-optimization program which includes automated exposure control, adjustment of the mA and/or kV according to patient size and/or use of iterative reconstruction technique. COMPARISON:  07/10/2018 FINDINGS: Lower chest: Patchy airspace disease is seen in both lower lobes with small bilateral pleural effusions. Hepatobiliary: No suspicious focal abnormality in the liver on this study without intravenous contrast. There is no evidence for gallstones, gallbladder wall thickening, or pericholecystic fluid. Common bile duct measures 7 mm diameter, upper normal to mildly enlarged for patient age. Pancreas: No focal mass lesion. No dilatation of the main duct. No intraparenchymal cyst. No peripancreatic edema. Spleen: No splenomegaly. No suspicious focal mass lesion. Adrenals/Urinary Tract: No adrenal nodule or mass. Mild right hydroureteronephrosis with mild right perinephric edema. There is moderate left hydroureteronephrosis with fairly extensive left perinephric edema/inflammation/hemorrhage. Left ureteral fullness extends into the pelvis. Bladder is markedly distended. Stomach/Bowel: Stomach is decompressed. Duodenum is normally positioned as is the ligament of Treitz. No small bowel wall thickening. No small bowel dilatation. Non dependent transverse colon is dilated with gas and stool measuring up  to about 7.5 cm maximum diameter. No colonic wall thickening. Vascular/Lymphatic: No abdominal aortic aneurysm. There is no gastrohepatic or hepatoduodenal ligament lymphadenopathy. No retroperitoneal or mesenteric lymphadenopathy. No pelvic sidewall lymphadenopathy. Reproductive: Prostate gland is enlarged. Other: Small volume free fluid is seen around the liver and spleen. Small volume fluid layers in the para colic gutters and anterior pelvis. Diffuse body wall edema noted in the abdomen and pelvis. Musculoskeletal: No worrisome lytic or sclerotic osseous abnormality. IMPRESSION: 1. Moderate left hydroureteronephrosis with fairly extensive left perinephric edema/inflammation/hemorrhage. Assessment is limited by lack of intravenous contrast material. Left ureteral fullness extends into the pelvis. No obstructing calculus identified. Findings in the left retroperitoneal space may reflect urine leak/caliceal rupture, pyelonephritis/urinary tract infection, sequelae of neoplasm such as urothelial etiology. 2. Markedly distended urinary bladder likely contributes to appearance of the left hydroureteronephrosis. Associated prostatomegaly raises the question of bladder outlet obstruction. 3. Mild right hydroureteronephrosis with mild right perinephric edema. 4. Small volume free fluid in the abdomen and pelvis. 5. Diffuse body wall edema in the abdomen and pelvis. 6. Patchy airspace disease in both lower lobes with small bilateral pleural effusions. Imaging features suggest  multifocal pneumonia. 7. Common bile duct measures 7 mm diameter, upper normal to mildly enlarged for patient age. Correlation with liver function tests recommended. Electronically Signed   By: Camellia Candle M.D.   On: 05/15/2024 13:56   DG Chest Port 1 View Result Date: 05/15/2024 CLINICAL DATA:  Shortness of breath. EXAM: PORTABLE CHEST 1 VIEW COMPARISON:  Chest radiograph dated 11/25/2023. FINDINGS: There is cardiomegaly with vascular  congestion and edema. No focal consolidation, pleural effusion or pneumothorax. No acute osseous pathology. IMPRESSION: Cardiomegaly with vascular congestion and edema. Electronically Signed   By: Vanetta Chou M.D.   On: 05/15/2024 13:16      Krystal Spinner 05/17/2024  Patient ID: Jordan Kennedy, male   DOB: [birth year], 68 y.o.   MRN: [id number]

## 2024-05-18 DIAGNOSIS — N320 Bladder-neck obstruction: Secondary | ICD-10-CM | POA: Diagnosis not present

## 2024-05-18 LAB — CBC
HCT: 36 % — ABNORMAL LOW (ref 39.0–52.0)
Hemoglobin: 12 g/dL — ABNORMAL LOW (ref 13.0–17.0)
MCH: 31.6 pg (ref 26.0–34.0)
MCHC: 33.3 g/dL (ref 30.0–36.0)
MCV: 94.7 fL (ref 80.0–100.0)
Platelets: 430 K/uL — ABNORMAL HIGH (ref 150–400)
RBC: 3.8 MIL/uL — ABNORMAL LOW (ref 4.22–5.81)
RDW: 17.1 % — ABNORMAL HIGH (ref 11.5–15.5)
WBC: 19.9 K/uL — ABNORMAL HIGH (ref 4.0–10.5)
nRBC: 0 % (ref 0.0–0.2)

## 2024-05-18 LAB — BASIC METABOLIC PANEL WITH GFR
Anion gap: 11 (ref 5–15)
BUN: 23 mg/dL (ref 8–23)
CO2: 20 mmol/L — ABNORMAL LOW (ref 22–32)
Calcium: 8.1 mg/dL — ABNORMAL LOW (ref 8.9–10.3)
Chloride: 110 mmol/L (ref 98–111)
Creatinine, Ser: 1.93 mg/dL — ABNORMAL HIGH (ref 0.61–1.24)
GFR, Estimated: 38 mL/min — ABNORMAL LOW (ref >60)
Glucose, Bld: 120 mg/dL — ABNORMAL HIGH (ref 70–99)
Potassium: 4.2 mmol/L (ref 3.5–5.1)
Sodium: 141 mmol/L (ref 135–145)

## 2024-05-18 MED ORDER — SODIUM CHLORIDE 0.9 % IV SOLN: INTRAVENOUS | Status: AC

## 2024-05-18 NOTE — TOC Initial Note (Addendum)
 Transition of Care [facility]) - Initial/Assessment Note    Patient Details  Name: Jordan Kennedy MRN: [id number] Date of Birth: 1956-02-03  [facility] CM/SW Contact:    Roxie KANDICE Stain, RN Phone Number: 05/18/2024, 10:15 AM  Clinical Narrative:                  Spoke to patient's legal guardian, Koroma, regarding transition needs.  Cindra states patient lives with him. Koroma is agreeable to home health with [facility] as in the past. [facility] with [facility] can accept [facility]. Cindra will transport patient home at discharge.  Address, Phone number and PCP verified.  Need home health PT, OT orders  Expected Discharge Plan: Home w Home Health Services Barriers to Discharge: Continued Medical Work up   Patient Goals and CMS Choice Patient states their goals for this hospitalization and ongoing recovery are:: legal guardian wants patient to retunr home CMS Medicare.gov Compare Post Acute Care list provided to:: Patient Represenative (must comment) Choice offered to / list presented to : [facility] POA / Guardian      Expected Discharge Plan and Services   Discharge Planning Services: CM Consult Post Acute Care Choice: Home Health Living arrangements for the past 2 months: Apartment                           HH Arranged: PT, OT HH Agency: [facility] Date [facility] Agency Contacted: 05/18/24 Time HH Agency Contacted: 1015 Representative spoke with at [facility] Agency: [facility]  Prior Living Arrangements/Services Living arrangements for the past 2 months: Apartment Lives with:: Other (Comment) (caregiver) Patient language and need for interpreter reviewed:: No Do you feel safe going back to the place where you live?: Yes      Need for Family Participation in Patient Care: Yes (Comment) Care giver support system in place?: Yes (comment)   Criminal Activity/Legal Involvement Pertinent to Current Situation/Hospitalization: No - Comment as needed  Activities of Daily Living    ADL Screening (condition at time of admission) Independently performs ADLs?: No Does the patient have a NEW difficulty with bathing/dressing/toileting/self-feeding that is expected to last >3 days?: Yes (Initiates electronic notice to provider for possible OT consult) Does the patient have a NEW difficulty with getting in/out of bed, walking, or climbing stairs that is expected to last >3 days?: Yes (Initiates electronic notice to provider for possible PT consult) Does the patient have a NEW difficulty with communication that is expected to last >3 days?: No Is the patient deaf or have difficulty hearing?: No Does the patient have difficulty seeing, even when wearing glasses/contacts?: No Does the patient have difficulty concentrating, remembering, or making decisions?: Yes  Permission Sought/Granted                  Emotional Assessment         Alcohol / Substance Use: Not Applicable    Admission diagnosis:  Small bowel obstruction (HCC) [K56.609] Bladder outlet obstruction [N32.0] Patient Active Problem List   Diagnosis Date Noted   Bladder outlet obstruction 05/15/2024   Enuresis 06/19/2023   Left bundle branch block 02/13/2023   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   Tinea pedis of both feet 01/11/2023   Chronic systolic heart failure (HCC) 01/03/2023   Prolonged QT interval 01/01/2023   Transaminitis 01/01/2023   Thrombocytosis 01/01/2023   Tardive dyskinesia 11/20/2021   Lower urinary tract symptoms (LUTS) 05/02/2021  History of small bowel obstruction 01/31/2021   History of colon polyps 01/31/2021   Onychomycosis 01/31/2021   Gastroesophageal reflux disease 01/31/2021   Tobacco use disorder, mild, in sustained remission 02/01/2015   Constipation due to slow transit 04/25/2012   Anxiety and depression 04/25/2012   Edentulous 04/25/2012   Schizophrenia (HCC)    Intellectual disability    PCP:  [name] HERO,  MD Pharmacy:   [facility] [address] [place] [address] Phone: [phone] Fax: [phone]  [facility] - [place], [place] - [address] [place] [address] Phone: [phone] Fax: [phone]     Social Drivers of Health (SDOH) Social History: SDOH Screenings   Food Insecurity: No Food Insecurity (11/05/2023)  Housing: Unknown (11/05/2023)  Transportation Needs: No Transportation Needs (11/05/2023)  Utilities: Not At Risk (11/05/2023)  Alcohol Screen: Low Risk  (11/05/2023)  Depression (PHQ2-9): Low Risk  (11/05/2023)  Financial Resource Strain: Low Risk  (11/05/2023)  Physical Activity: Inactive (11/05/2023)  Social Connections: Moderately Isolated (11/05/2023)  Stress: No Stress Concern Present (11/05/2023)  Tobacco Use: Medium Risk (05/15/2024)  Health Literacy: Adequate Health Literacy (11/05/2023)   SDOH Interventions:     Readmission Risk Interventions     No data to display

## 2024-05-18 NOTE — Progress Notes (Signed)
 Physical Therapy Treatment Patient Details Name: Jordan Kennedy MRN: [id number] DOB: 1956-01-16 Today's Date: 05/18/2024   History of Present Illness Pt is a 68 y.o. male admitted 05/15/24 with abdominal discomfort, hypotension. Workup for AKI, bladder outlet obstruction, L hydroureteronephrosis, SBO; NGT placed but pt pulled out. PMH includes schizophrenia, intellectual disability, tardive dyskinesia, chronic constipation, ileus, SBO, BPH.    PT Comments  Pt making minor progress; however, he remains limited secondary to impaired cognition. Pt again perseverating on food and eating, but agreeable to transfer to recliner chair to eat his breakfast. Hopeful to progress to gait training/ambulation at next session. Pt would continue to benefit from skilled physical therapy services at this time while admitted and after d/c to address the below listed limitations in order to improve overall safety and independence with functional mobility.     If plan is discharge home, recommend the following: A little help with walking and/or transfers;A little help with bathing/dressing/bathroom;Assistance with cooking/housework;Assist for transportation;Help with stairs or ramp for entrance;Supervision due to cognitive status;Direct supervision/assist for medications management;Direct supervision/assist for financial management   Can travel by private vehicle        Equipment Recommendations  None recommended by PT    Recommendations for Other Services       Precautions / Restrictions Precautions Precautions: Fall;Other (comment) Recall of Precautions/Restrictions: Impaired Precaution/Restrictions Comments: foley Restrictions Weight Bearing Restrictions Per Provider Order: No     Mobility  Bed Mobility Overal bed mobility: Modified Independent                  Transfers Overall transfer level: Needs assistance Equipment used: Rolling walker (2 wheels) Transfers: Sit to/from Stand, Bed to  chair/wheelchair/BSC Sit to Stand: Supervision   Step pivot transfers: Supervision       General transfer comment: pt standing from EOB without difficulties and then pivoted to recliner chair towards his R side with RW present but pt not utilizing properly (lightly holding with one hand while AD remained stationary)    Ambulation/Gait                   Stairs             Wheelchair Mobility     Tilt Bed    Modified Rankin (Stroke Patients Only)       Balance Overall balance assessment: Needs assistance Sitting-balance support: No upper extremity supported Sitting balance-Leahy Scale: Good     Standing balance support: Single extremity supported, Bilateral upper extremity supported Standing balance-Leahy Scale: Poor                              Communication Communication Communication: Impaired Factors Affecting Communication: Reduced clarity of speech  Cognition Arousal: Alert Behavior During Therapy: Restless   PT - Cognitive impairments: History of cognitive impairments                       PT - Cognition Comments: h/o schizophrenia, cognitive impairment. pt perseverating on eating this session; breakfast tray present and pt very adamant about eating Following commands: Impaired Following commands impaired: Follows one step commands inconsistently    Cueing Cueing Techniques: Verbal cues, Gestural cues  Exercises      General Comments        Pertinent Vitals/Pain Pain Assessment Pain Assessment: No/denies pain Pain Intervention(s): Monitored during session    Home Living  Prior Function            PT Goals (current goals can now be found in the care plan section) Acute Rehab PT Goals PT Goal Formulation: Patient unable to participate in goal setting Time For Goal Achievement: 05/30/24 Potential to Achieve Goals: Good Progress towards PT goals: Progressing toward goals     Frequency    Min 2X/week      PT Plan      Co-evaluation              AM-PAC PT 6 Clicks Mobility   Outcome Measure  Help needed turning from your back to your side while in a flat bed without using bedrails?: None Help needed moving from lying on your back to sitting on the side of a flat bed without using bedrails?: None Help needed moving to and from a bed to a chair (including a wheelchair)?: A Little Help needed standing up from a chair using your arms (e.g., wheelchair or bedside chair)?: A Little Help needed to walk in hospital room?: A Little Help needed climbing 3-5 steps with a railing? : A Lot 6 Click Score: 19    End of Session   Activity Tolerance: Other (comment) (limited secondary to cognitive impairments) Patient left: in chair;with call bell/phone within reach;with chair alarm set Nurse Communication: Mobility status PT Visit Diagnosis: Other abnormalities of gait and mobility (R26.89);Muscle weakness (generalized) (M62.81)     Time: 9171-9159 PT Time Calculation (min) (ACUTE ONLY): 12 min  Charges:    $Therapeutic Activity: 8-22 mins PT General Charges $$ ACUTE PT VISIT: 1 Visit                     [name] [name], DPT  Acute Rehabilitation Services Office [phone]    [name] [name] 05/18/2024, 8:59 AM

## 2024-05-18 NOTE — Plan of Care (Signed)

## 2024-05-18 NOTE — Progress Notes (Signed)
   Subjective/Chief Complaint: Patient having bowel movements.  Denies pain and tolerating diet.   Objective: Vital signs in last 24 hours: Temp:  [98.6 F (37 C)-99.3 F (37.4 C)] 98.6 F (37 C) (07/28 0807) Pulse Rate:  [81-100] 81 (07/28 0807) Resp:  [16-19] 19 (07/28 0807) BP: (107-154)/(71-93) 154/81 (07/28 0807) SpO2:  [94 %-99 %] 99 % (07/28 0807) Last BM Date : 05/16/24  Intake/Output from previous day: 07/27 0701 - 07/28 0700 In: 960 [P.O.:960] Out: 2425 [Urine:2425] Intake/Output this shift: No intake/output data recorded.   HEENT - sclerae clear, mucous membranes moist Abdomen - soft, mild distension; non-tender Ext - no edema, non-tender Lab Results:  Recent Labs    05/17/24 0512 05/18/24 0540  WBC 19.0* 19.9*  HGB 11.7* 12.0*  HCT 35.4* 36.0*  PLT 438* 430*   BMET Recent Labs    05/17/24 0512 05/18/24 0540  NA 145 141  K 4.2 4.2  CL 113* 110  CO2 22 20*  GLUCOSE 115* 120*  BUN 33* 23  CREATININE 2.01* 1.93*  CALCIUM  8.1* 8.1*   PT/INR Recent Labs    05/15/24 1327  LABPROT 16.6*  INR 1.3*   ABG No results for input(s): PHART, HCO3 in the last 72 hours.  Invalid input(s): PCO2, PO2  Studies/Results: DG Abd Portable 1V-Small Bowel Obstruction Protocol-initial, 8 hr delay Result Date: 05/16/2024 CLINICAL DATA:  8 hour small-bowel follow-up film EXAM: PORTABLE ABDOMEN - 1 VIEW COMPARISON:  Plain film from the previous day. FINDINGS: Administered contrast now lies within the colon and rectum. Persistent but improved gaseous distension of the colon is noted. No free air is seen. No bony abnormality is noted. IMPRESSION: Administered contrast now lies within the colon and rectum. Persistent but improved colonic dilatation is noted. Electronically Signed   By: Oneil Devonshire M.D.   On: 05/16/2024 19:44    Anti-infectives: Anti-infectives (From admission, onward)    Start     Dose/Rate Route Frequency Ordered Stop   05/15/24 1300   ceFEPIme  (MAXIPIME ) 2 g in sodium chloride  0.9 % 100 mL IVPB        2 g 200 mL/hr over 30 Minutes Intravenous  Once 05/15/24 1257 05/15/24 1427   05/15/24 1300  metroNIDAZOLE  (FLAGYL ) IVPB 500 mg        500 mg 100 mL/hr over 60 Minutes Intravenous  Once 05/15/24 1257 05/15/24 1504   05/15/24 1300  vancomycin  (VANCOCIN ) IVPB 1000 mg/200 mL premix  Status:  Discontinued        1,000 mg 200 mL/hr over 60 Minutes Intravenous  Once 05/15/24 1257 05/15/24 1258   05/15/24 1300  vancomycin  (VANCOREADY) IVPB 2000 mg/400 mL        2,000 mg 200 mL/hr over 120 Minutes Intravenous  Once 05/15/24 1258 05/15/24 1606       Assessment/Plan: Assessment & Plan: SBO - tolerating CLD, two BM's recorded - gastrograffin po now in colon on AXR - will advance to soft diet this AM   Encouraged OOB, ambulation.  He tolerates soft diet, he can be discharged   LOS: 3 days    Debby DELENA Shipper MD  Moderate complexity  05/18/2024

## 2024-05-18 NOTE — Care Management Important Message (Signed)
 Important Message  Patient Details  Name: Jordan Kennedy MRN: [id number] Date of Birth: Jan 13, 1956   Important Message Given:        Glade Cuff 05/18/2024, 3:53 PM

## 2024-05-18 NOTE — Progress Notes (Signed)
 PROGRESS NOTE Jordan Kennedy  FMW:[id number] DOB: [birth year] DOA: 05/15/2024 PCP: [name] HERO, MD  Brief Narrative/Hospital Course: 84 yom w/ schizophrenia, SBO, BPH, who was in [facility] until having abdomen discomfort 7/25 morning when he woke up and activated EMS. In the ED vitals hypotension tachypnea labs with creatinine 6.78 leukocytosis 27.2, and lactate 1.1. CT abd/pelvis>>left hydroureteronephrosis with fairly extensive left perinephric  edema/inflammation/hemorrhage w/ L hydroureteronephrosis and prostatomegaly. EDP placed foley, started IV cefepime /vanc), consulted urology, and was admitted for acute renal failure with bladder outlet obstruction and left hydroureteronephrosis. NG tube placed on admission but patient removed.  Foley catheter continued Patient has clinically stabilized.  Diet is started being advanced.  Creatinine has been coming down nicely Subjective: SEEN EXAMINED Resting comfortably no complaints Overnight afebrile BP stable Labs with creatinine down to 1.9 BUN 23 Having Bms.  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  Appreciate urology input. Flomax  on hold, PSA level elevated at 11> defer to urology: Who advised to do voiding trial in 5 days but patient is being discharged in 2 days so he will need to follow-up with Dr.Eskridge in few days for Foley removal.  Give gentle IV fluids overnight Recent Labs    06/25/23 1208 06/26/23 1023 11/25/23 0949 05/15/24 1227 05/15/24 1302 05/16/24 0333 05/17/24 0512 05/18/24 0540  BUN 18 18 15  93* 81* 66* 33* 23  CREATININE 1.63* 1.47* 1.23 6.63* 6.70* 3.89* 2.01* 1.93*  CO2 23 22 22  21*  --  20* 22 20*  K 3.5 3.8 4.1 4.6 4.7 4.5 4.2 4.2   SBO Nausea and vomiting: Surgery  following on SBO protocol>NG tube removed> diet has been started 7/26> on full liquid diet 7/27> to soft today x-ray now shows contrast in the colon and rectum persistent but improved colonic dilatation  Schizophrenia Continue clozapine    Constipation Resume home senna/docusate 2 tabs BID and miralax  17g BID as able, per CCS  Significant leukocytosis: Unclear etiology UA WBC 20-50, urine culture blood culture pending, antibiotics on hold.  Now improving likely reactive-keeping fluids overnight Recent Labs  Lab 05/15/24 1227 05/16/24 0333 05/17/24 0512 05/18/24 0540  WBC 27.7* 23.9* 19.0* 19.9*    DVT prophylaxis: SCDs Start: 05/15/24 1452 Code Status:   Code Status: Full Code Family Communication: plan of care discussed with patient at bedside. Patient status is: Remains hospitalized because of severity of illness Level of care: Med-Surg   Dispo: The patient is from: group home            Anticipated disposition: Anticipating discharge in am  Objective: Vitals last 24 hrs: Vitals:   05/17/24 1941 05/18/24 0414 05/18/24 0759 05/18/24 0807  BP: 112/71 125/85 (!) 107/93 (!) 154/81  Pulse: 94 100 92 81  Resp:  16 19 19   Temp: 99.2 F (37.3 C) 98.9 F (37.2 C) 98.7 F (37.1 C) 98.6 F (37 C)  TempSrc: Oral Oral    SpO2: 99% 96% 94% 99%  Weight:      Height:        Physical Examination: General exam: alert awake, oriented at baseline, older than stated age HEENT:Oral mucosa moist, Ear/Nose WNL grossly Respiratory system: Bilaterally clear BS,no use of accessory muscle Cardiovascular system: S1 & S2 +, No JVD. Gastrointestinal system: Abdomen soft,NT,ND, BS+ Nervous System: Alert, awake, moving all extremities,and following commands.  Extremities: LE edema neg,distal peripheral pulses palpable and warm.  Skin: No rashes,no icterus. MSK: Normal muscle bulk,tone, power   Data Reviewed: I have personally reviewed following labs and imaging studies ( see epic result  tab) CBC: Recent Labs  Lab 05/15/24 1227 05/15/24 1302 05/16/24 0333 05/17/24 0512 05/18/24 0540  WBC 27.7*  --  23.9* 19.0* 19.9*  NEUTROABS  --   --  20.4*  --   --   HGB 11.3* 11.6* 11.6* 11.7* 12.0*  HCT 34.0* 34.0* 34.9* 35.4* 36.0*  MCV 94.4  --  93.3 94.1 94.7  PLT 400  --  394 438* 430*   CMP: Recent Labs  Lab 05/15/24 1227 05/15/24 1302 05/16/24 0333 05/17/24 0512 05/18/24 0540  NA 137 140 144 145 141  K 4.6 4.7 4.5 4.2 4.2  CL 105 108 112* 113* 110  CO2 21*  --  20* 22 20*  GLUCOSE 116* 110* 87 115* 120*  BUN 93* 81* 66* 33* 23  CREATININE 6.63* 6.70* 3.89* 2.01* 1.93*  CALCIUM  8.1*  --  8.4* 8.1* 8.1*   GFR: Estimated Creatinine Clearance: 45.6 mL/min (A) (by C-G formula based on SCr of 1.93 mg/dL (H)). Recent Labs  Lab 05/15/24 1227  AST 29  ALT 56*  ALKPHOS 101  BILITOT 0.9  PROT 5.2*  ALBUMIN 2.0*    Recent Labs  Lab 05/15/24 1227  LIPASE 34   No results for input(s): AMMONIA in the last 168 hours. Coagulation Profile:  Recent Labs  Lab 05/15/24 1327  INR 1.3*   Unresulted Labs (From admission, onward)     Start     Ordered   05/17/24 0500  Basic metabolic panel with GFR  Daily,   R      05/16/24 1051   05/17/24 0500  CBC  Daily,   R      05/16/24 1051   05/15/24 1505  Clozapine  (clozaril )  Once,   R        05/15/24 1505           Antimicrobials/Microbiology: Anti-infectives (From admission, onward)    Start     Dose/Rate Route Frequency Ordered Stop   05/15/24 1300  ceFEPIme  (MAXIPIME ) 2 g in sodium chloride  0.9 % 100 mL IVPB        2 g 200 mL/hr over 30 Minutes Intravenous  Once 05/15/24 1257 05/15/24 1427   05/15/24 1300  metroNIDAZOLE  (FLAGYL ) IVPB 500 mg        500 mg 100 mL/hr over 60 Minutes Intravenous  Once 05/15/24 1257 05/15/24 1504   05/15/24 1300  vancomycin  (VANCOCIN ) IVPB 1000 mg/200 mL premix  Status:  Discontinued        1,000 mg 200 mL/hr over 60 Minutes Intravenous  Once 05/15/24 1257 05/15/24 1258    05/15/24 1300  vancomycin  (VANCOREADY) IVPB 2000 mg/400 mL        2,000 mg 200 mL/hr over 120 Minutes Intravenous  Once 05/15/24 1258 05/15/24 1606         Component Value Date/Time   SDES BLOOD LEFT HAND 05/15/2024 1404   SPECREQUEST  05/15/2024 1404    AEROBIC BOTTLE ONLY Blood Culture results may not be optimal due to an inadequate volume of blood received in culture bottles   CULT  05/15/2024 1404    NO GROWTH 3 DAYS Performed at [facility], [address] [place], [place] [address]    REPTSTATUS PENDING 05/15/2024 1404    Procedures:  Medications reviewed:  Scheduled Meds:  amantadine   100 mg Oral QHS   Chlorhexidine  Gluconate Cloth  6 each Topical Daily   cloZAPine   200 mg Oral QHS   finasteride   5 mg Oral Daily   linaclotide   290 mcg Oral QAC breakfast   melatonin  10 mg Oral QHS   metoprolol  succinate  12.5 mg Oral Daily   polyethylene glycol  17 g Oral BID   senna-docusate  2 tablet Oral BID   tamsulosin   0.4 mg Oral QPM   Continuous Infusions:    Mennie LAMY, MD [facility] 05/18/2024, 11:06 AM

## 2024-05-18 NOTE — Plan of Care (Signed)

## 2024-05-18 NOTE — TOC CM/SW Note (Signed)
 Transition of Care [facility]) - Inpatient Brief Assessment   Patient Details  Name: Jordan Kennedy MRN: [id number] Date of Birth: [birth year]  Transition of Care [facility]) CM/SW Contact:    Lauraine FORBES Saa, LCSW Phone Number: 05/18/2024, 9:46 AM   Clinical Narrative:  9:46 AM Per chart review, patient resides at home with roommates/friends. Patient has a PCP and insurance. Patient does not have SNF history. Patient has HH history with [facility]. Patient has rolling walker through [employer]. Patient's preferred pharmacy's are [facility] and [facility]. Physical therapy recommended patient discharge home with [facility] TOC will continue to follow and be available to assist.  Transition of Care Asessment: Insurance and Status: Insurance coverage has been reviewed Patient has primary care physician: Yes Home environment has been reviewed: Private Residence Prior level of function:: Needs Assistance Prior/Current Home Services: No current home services (Has HH/DME history) Social Drivers of Health Review: SDOH reviewed no interventions necessary Readmission risk has been reviewed: Yes Transition of care needs: transition of care needs identified, TOC will continue to follow

## 2024-05-18 NOTE — Progress Notes (Signed)
 Mobility Specialist Progress Note:   05/18/24 1059  Mobility  Activity Ambulated with assistance in hallway  Level of Assistance Minimal assist, patient does 75% or more  Assistive Device Front wheel walker  Distance Ambulated (ft) 40 ft  Activity Response Tolerated well  Mobility Referral Yes  Mobility visit 1 Mobility  Mobility Specialist Start Time (ACUTE ONLY) [id number]  Mobility Specialist Stop Time (ACUTE ONLY) 1002  Mobility Specialist Time Calculation (min) (ACUTE ONLY) 11 min   Pt found laying on a stripped bed, but agreeable to mobility.  Required MinA during d/t being unsteady, otherwise tolerated well. While pt was walking, another MS made the bed. Returned to room and left pt in bed with alarm on. Assisted with gown change. Personal belongings and call light within reach. All needs met.  Lavanda Pollack Mobility Specialist  Please contact via [employer] or  Rehab Office [phone]

## 2024-05-19 ENCOUNTER — Other Ambulatory Visit (HOSPITAL_COMMUNITY): Payer: Self-pay

## 2024-05-19 DIAGNOSIS — N320 Bladder-neck obstruction: Secondary | ICD-10-CM | POA: Diagnosis not present

## 2024-05-19 LAB — CBC
HCT: 36.4 % — ABNORMAL LOW (ref 39.0–52.0)
Hemoglobin: 11.7 g/dL — ABNORMAL LOW (ref 13.0–17.0)
MCH: 30.6 pg (ref 26.0–34.0)
MCHC: 32.1 g/dL (ref 30.0–36.0)
MCV: 95.3 fL (ref 80.0–100.0)
Platelets: 445 K/uL — ABNORMAL HIGH (ref 150–400)
RBC: 3.82 MIL/uL — ABNORMAL LOW (ref 4.22–5.81)
RDW: 16.9 % — ABNORMAL HIGH (ref 11.5–15.5)
WBC: 18.1 K/uL — ABNORMAL HIGH (ref 4.0–10.5)
nRBC: 0 % (ref 0.0–0.2)

## 2024-05-19 LAB — BASIC METABOLIC PANEL WITH GFR
Anion gap: 7 (ref 5–15)
BUN: 19 mg/dL (ref 8–23)
CO2: 21 mmol/L — ABNORMAL LOW (ref 22–32)
Calcium: 7.8 mg/dL — ABNORMAL LOW (ref 8.9–10.3)
Chloride: 112 mmol/L — ABNORMAL HIGH (ref 98–111)
Creatinine, Ser: 1.45 mg/dL — ABNORMAL HIGH (ref 0.61–1.24)
GFR, Estimated: 53 mL/min — ABNORMAL LOW (ref >60)
Glucose, Bld: 108 mg/dL — ABNORMAL HIGH (ref 70–99)
Potassium: 4 mmol/L (ref 3.5–5.1)
Sodium: 140 mmol/L (ref 135–145)

## 2024-05-19 MED ORDER — FINASTERIDE 5 MG PO TABS
5.0000 mg | ORAL_TABLET | Freq: Every day | ORAL | 0 refills | Status: DC
  Filled 2024-05-19: qty 30, 30d supply, fill #0

## 2024-05-19 NOTE — Discharge Summary (Signed)
 Physician Discharge Summary  Jordan Kennedy FMW:[id number] DOB: 04/05/1956 DOA: 05/15/2024  PCP: [name] HERO, MD  Admit date: 05/15/2024 Discharge date: 05/19/2024 Recommendations for Outpatient Follow-up:  Follow up with PCP in 1 weeks-call for appointment Please obtain BMP/CBC in one week Follow-up with Dr. Nieves in 3 to 4 days  Discharge Dispo: Home W/ CAREGIVER. Discharge Condition: Stable Code Status:   Code Status: Full Code Diet recommendation:  Diet Order             DIET SOFT Fluid consistency: Thin  Diet effective now                    Brief/Interim Summary: 67 yom w/ schizophrenia, SBO, BPH, who was in [facility] until having abdomen discomfort 7/25 morning when he woke up and activated EMS. In the ED vitals hypotension tachypnea labs with creatinine 6.78 leukocytosis 27.2, and lactate 1.1. CT abd/pelvis>>left hydroureteronephrosis with fairly extensive left perinephric  edema/inflammation/hemorrhage w/ L hydroureteronephrosis and prostatomegaly. EDP placed foley, started IV cefepime /vanc), consulted urology, and was admitted for acute renal failure with bladder outlet obstruction and left hydroureteronephrosis. NG tube placed on admission but patient removed.  Foley catheter continued Patient has clinically stabilized.  Diet is started being advanced.  Creatinine has been coming down nicely and significantly improved.  At this time tolerating diet having bowel movement.  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   Appreciate urology input. Flomax  on hold, PSA level elevated at 11> defer to urology: Who advised to do voiding trial in 5 days but patient is being discharged in 3 days so he will need to follow-up with Dr.Eskridge in few days for Foley removal.  We was given gentle IV fluids Recent Labs    06/25/23 1208 06/26/23 1023 11/25/23 0949 05/15/24 1227 05/15/24 1302 05/16/24 0333 05/17/24 0512 05/18/24 0540 05/19/24 0452  BUN 18 18 15  93* 81* 66* 33* 23 19  CREATININE 1.63* 1.47* 1.23 6.63* 6.70* 3.89* 2.01* 1.93* 1.45*  CO2 23 22 22  21*  --  20* 22 20* 21*  K 3.5 3.8 4.1 4.6 4.7 4.5 4.2 4.2 4.0   SBO Nausea and vomiting Constipation: Surgery following s/p SBO protocol>NG tube removed> diet has been started on soft diet.  Having bowel movement  x-ray now shows contrast in the colon and rectum persistent but improved colonic dilatation At home consider resuming home senna/docusate 2 tabs BID and miralax  17g BID if diarrhea improves  Schizophrenia Continue clozapine    Significant leukocytosis: Unclear etiology UA WBC 20-50, but urine and blood culture from 7/25 NGTD. Antibiotics on hold. WBC slowly trending down will need follow-up CBC in few days Recent Labs  Lab 05/15/24 1227 05/16/24 0333 05/17/24 0512 05/18/24 0540 05/19/24 0452  WBC 27.7* 23.9* 19.0* 19.9* 18.1*    DVT prophylaxis: SCDs Start: 05/15/24 1452 Code Status:   Code Status: Full Code Family Communication: plan of care discussed with patient at bedside. Patient status is: Remains hospitalized because of severity of illness Level of care: Med-Surg   Dispo: The patient is from: group home  Anticipated disposition: Anticipating discharge back to Group home.  Objective: Vitals last 24 hrs: Vitals:   05/18/24 1632 05/18/24 1911 05/19/24 0418 05/19/24 0806  BP: (!) 115/94 115/89 (!) 105/93 134/87  Pulse: 96 96 98 91  Resp: 19 16 16 19   Temp: 98.9 F (37.2 C) 99.2 F (37.3 C) 98 F (36.7 C) 98.5 F  (36.9 C)  TempSrc:  Oral Oral   SpO2: 94% 98% 97% 96%  Weight:      Height:        Physical Examination: General exam: alert awake, follows commands and oriented. HEENT:Oral mucosa moist, Ear/Nose WNL grossly Respiratory system: Bilaterally clear BS,no use of accessory muscle Cardiovascular system: S1 & S2 +, No JVD. Gastrointestinal system: Abdomen soft,NT,ND, BS+ Nervous System: Alert, awake, moving all extremities,and following commands. Extremities: LE edema neg,distal peripheral pulses palpable and warm.  Skin: No rashes,no icterus. MSK: Normal muscle bulk,tone, power    Discharge Instructions  Discharge Instructions     Discharge instructions   Complete by: As directed    Follow-up with Dr. Nieves from urology for voiding trial and Foley removal within a week  Please call call MD or return to ER for similar or worsening recurring problem that brought you to hospital or if any fever,nausea/vomiting,abdominal pain, uncontrolled pain, chest pain,  shortness of breath or any other alarming symptoms.  Please follow-up your doctor as instructed in a week time and call the office for appointment.  Please avoid alcohol, smoking, or any other illicit substance and maintain healthy habits including taking your regular medications as prescribed.  You were cared for by a hospitalist during your hospital stay. If you have any questions about your discharge medications or the care you received while you were in the hospital after you are discharged, you can call the unit and ask to speak with the hospitalist on call if the hospitalist that took care of you is not available.  Once you are discharged, your primary care physician will handle any further medical issues. Please note that NO REFILLS for any discharge medications will be authorized once you are discharged, as it is imperative that you return to your primary care physician (or establish a relationship with a primary care  physician if you do not have one) for your aftercare needs so that they can reassess your need for medications and monitor your lab values   Increase activity slowly   Complete by: As directed       Allergies as of 05/19/2024   No Known Allergies      Medication List     PAUSE taking these medications    Myrbetriq  50 MG Tb24 tablet Wait to take this until your doctor or other care provider tells you to start again. Generic drug: mirabegron  ER Take 50 mg by mouth daily.       TAKE these medications    amantadine  100 MG capsule Commonly known as: SYMMETREL  Take 100 mg by mouth at bedtime.   Aspirin  EC Adult Low Dose 81 MG tablet Generic drug: aspirin  EC TAKE 1 TABLET BY MOUTH EVERY DAY   cloZAPine  100 MG tablet Commonly known as: CLOZARIL  Take 2 tablets (200 mg total) by mouth 2 (two) times daily.   Entresto  24-26 MG Generic drug: sacubitril -valsartan  TAKE 1 TABLET BY MOUTH 2 TIMES DAILY   Farxiga  10 MG Tabs tablet Generic drug: dapagliflozin  propanediol TAKE 1 TABLET BY MOUTH EVERY DAY   finasteride  5 MG tablet Commonly known as: PROSCAR  Take 1  tablet (5 mg total) by mouth daily.   furosemide  40 MG tablet Commonly known as: LASIX  Take 1 tablet (40 mg total) by mouth as needed. What changed: reasons to take this   Linzess  290 MCG Caps capsule Generic drug: linaclotide  TAKE ONE CAPSULE DAILY BEFORE BREAKFAST   Melatonin 10 MG Subl Place 10 mg under the tongue at bedtime.   metoCLOPramide  5 MG tablet Commonly known as: REGLAN  TAKE 1 TABLET BY MOUTH 2 TIMES DAILY   metoprolol  succinate 25 MG 24 hr tablet Commonly known as: TOPROL -XL TAKE 1/2 TABLET BY MOUTH EVERY DAY   midodrine  5 MG tablet Commonly known as: PROAMATINE  Take 1 tablet (5 mg total) by mouth 2 (two) times daily with a meal.   omeprazole  40 MG capsule Commonly known as: PRILOSEC TAKE 1 CAPSULE BY MOUTH EVERY EVENING   PARoxetine  40 MG tablet Commonly known as: PAXIL  Take 40 mg by  mouth every evening.   polyethylene glycol powder 17 GM/SCOOP powder Commonly known as: GLYCOLAX /MIRALAX  Take 17 g by mouth See admin instructions. 17 g twice weekly on Monday and Thursday.   Senna Plus 8.6-50 MG tablet Generic drug: senna-docusate TAKE 1 TABLET BY MOUTH 2 TIMES DAILY   tamsulosin  0.4 MG Caps capsule Commonly known as: FLOMAX  Take 0.4 mg by mouth daily after supper.   zolpidem  10 MG tablet Commonly known as: AMBIEN  Take 1 tablet (10 mg total) by mouth at bedtime.        Follow-up Information     [facility] Follow up.   Specialty: Home Health Services Why: Home health has been arranged. They will contact you to schedule apt within 48hrs post discharge. Contact information: [address] [place] [address] [phone]         Nieves Cough, MD Follow up in 3 day(s).   Specialty: Urology Contact information: [address] [place] [address] [phone]         [name] HERO, MD Follow up in 1 week(s).   Specialty: Family Medicine Contact information: [address] [place] [address] [phone]                No Known Allergies  The results of significant diagnostics from this hospitalization (including imaging, microbiology, ancillary and laboratory) are listed below for reference.    Microbiology: Recent Results (from the past 240 hours)  Urine Culture     Status: None   Collection Time: 05/15/24 12:26 PM   Specimen: Urine, Random  Result Value Ref Range Status   Specimen Description URINE, RANDOM  Final   Special Requests NONE Reflexed from [id number]  Final   Culture   Final    NO GROWTH Performed at [facility], [address] [place], [place] [address]    Report Status 05/16/2024 FINAL  Final  Blood Culture (routine x 2)     Status: None (Preliminary result)   Collection Time: 05/15/24  1:27 PM   Specimen: BLOOD RIGHT ARM  Result Value Ref Range  Status   Specimen Description BLOOD RIGHT ARM  Final   Special Requests   Final    BOTTLES DRAWN AEROBIC AND ANAEROBIC Blood Culture adequate volume   Culture   Final    NO GROWTH 4 DAYS Performed at [facility], [address], [place], [place] [address]    Report Status PENDING  Incomplete  Blood Culture (routine x 2)     Status: None (  Preliminary result)   Collection Time: 05/15/24  2:04 PM   Specimen: BLOOD LEFT HAND  Result Value Ref Range Status   Specimen Description BLOOD LEFT HAND  Final   Special Requests   Final    AEROBIC BOTTLE ONLY Blood Culture results may not be optimal due to an inadequate volume of blood received in culture bottles   Culture   Final    NO GROWTH 4 DAYS Performed at [facility], [address] [place], [place] [address]    Report Status PENDING  Incomplete  Resp panel by RT-PCR (RSV, Flu A&B, Covid) Anterior Nasal Swab     Status: None   Collection Time: 05/15/24  2:30 PM   Specimen: Anterior Nasal Swab  Result Value Ref Range Status   SARS Coronavirus 2 by RT PCR NEGATIVE NEGATIVE Final   Influenza A by PCR NEGATIVE NEGATIVE Final   Influenza B by PCR NEGATIVE NEGATIVE Final    Comment: (NOTE) The Xpert Xpress SARS-CoV-2/FLU/RSV plus assay is intended as an aid in the diagnosis of influenza from Nasopharyngeal swab specimens and should not be used as a sole basis for treatment. Nasal washings and aspirates are unacceptable for Xpert Xpress SARS-CoV-2/FLU/RSV testing.  Fact Sheet for Patients: BloggerCourse.com  Fact Sheet for Healthcare Providers: SeriousBroker.it  This test is not yet approved or cleared by the [place]  FDA and has been authorized for detection and/or diagnosis of SARS-CoV-2 by FDA under an Emergency Use Authorization (EUA). This EUA will remain in effect (meaning this test can be used) for the duration of the COVID-19 declaration under Section  564(b)(1) of the Act, 21 U.S.C. section 360bbb-3(b)(1), unless the authorization is terminated or revoked.     Resp Syncytial Virus by PCR NEGATIVE NEGATIVE Final    Comment: (NOTE) Fact Sheet for Patients: BloggerCourse.com  Fact Sheet for Healthcare Providers: SeriousBroker.it  This test is not yet approved or cleared by the [place]  FDA and has been authorized for detection and/or diagnosis of SARS-CoV-2 by FDA under an Emergency Use Authorization (EUA). This EUA will remain in effect (meaning this test can be used) for the duration of the COVID-19 declaration under Section 564(b)(1) of the Act, 21 U.S.C. section 360bbb-3(b)(1), unless the authorization is terminated or revoked.  Performed at [facility], [address] [place], [place] [address]   Procedures/Studies: DG Abd Portable 1V-Small Bowel Obstruction Protocol-initial, 8 hr delay Result Date: 05/16/2024 CLINICAL DATA:  8 hour small-bowel follow-up film EXAM: PORTABLE ABDOMEN - 1 VIEW COMPARISON:  Plain film from the previous day. FINDINGS: Administered contrast now lies within the colon and rectum. Persistent but improved gaseous distension of the colon is noted. No free air is seen. No bony abnormality is noted. IMPRESSION: Administered contrast now lies within the colon and rectum. Persistent but improved colonic dilatation is noted. Electronically Signed   By: Oneil Devonshire M.D.   On: 05/16/2024 19:44   DG Abdomen 1 View Result Date: 05/15/2024 CLINICAL DATA:  Abdominal pain. EXAM: ABDOMEN - 1 VIEW COMPARISON:  07/11/2018. Abdomen and pelvis CT dated 05/15/2024. Portable chest obtained earlier today. FINDINGS: Stable dilated, gas and stool-filled transverse colon. There was some mesenteric swirling in the left mid abdomen on the recent CT. There was no bowel wall thickening or pneumatosis on the recent CT. Small amount of gas and stool in the rectum and sigmoid  colon. Mild-to-moderate lumbar spine degenerative changes. Recently demonstrated enlarged heart and pulmonary edema. IMPRESSION: 1. Stable dilated, gas and  stool-filled transverse colon. This could be due to a colonic ileus or a partial colonic obstruction due to an internal hernia. 2. No bowel wall thickening or pneumatosis on the recent CT. Electronically Signed   By: Elspeth Bathe M.D.   On: 05/15/2024 14:32   CT ABDOMEN PELVIS WO CONTRAST Result Date: 05/15/2024 CLINICAL DATA:  Generalized weakness and abdominal pain. EXAM: CT ABDOMEN AND PELVIS WITHOUT CONTRAST TECHNIQUE: Multidetector CT imaging of the abdomen and pelvis was performed following the standard protocol without IV contrast. RADIATION DOSE REDUCTION: This exam was performed according to the departmental dose-optimization program which includes automated exposure control, adjustment of the mA and/or kV according to patient size and/or use of iterative reconstruction technique. COMPARISON:  07/10/2018 FINDINGS: Lower chest: Patchy airspace disease is seen in both lower lobes with small bilateral pleural effusions. Hepatobiliary: No suspicious focal abnormality in the liver on this study without intravenous contrast. There is no evidence for gallstones, gallbladder wall thickening, or pericholecystic fluid. Common bile duct measures 7 mm diameter, upper normal to mildly enlarged for patient age. Pancreas: No focal mass lesion. No dilatation of the main duct. No intraparenchymal cyst. No peripancreatic edema. Spleen: No splenomegaly. No suspicious focal mass lesion. Adrenals/Urinary Tract: No adrenal nodule or mass. Mild right hydroureteronephrosis with mild right perinephric edema. There is moderate left hydroureteronephrosis with fairly extensive left perinephric edema/inflammation/hemorrhage. Left ureteral fullness extends into the pelvis. Bladder is markedly distended. Stomach/Bowel: Stomach is decompressed. Duodenum is normally positioned as is  the ligament of Treitz. No small bowel wall thickening. No small bowel dilatation. Non dependent transverse colon is dilated with gas and stool measuring up to about 7.5 cm maximum diameter. No colonic wall thickening. Vascular/Lymphatic: No abdominal aortic aneurysm. There is no gastrohepatic or hepatoduodenal ligament lymphadenopathy. No retroperitoneal or mesenteric lymphadenopathy. No pelvic sidewall lymphadenopathy. Reproductive: Prostate gland is enlarged. Other: Small volume free fluid is seen around the liver and spleen. Small volume fluid layers in the para colic gutters and anterior pelvis. Diffuse body wall edema noted in the abdomen and pelvis. Musculoskeletal: No worrisome lytic or sclerotic osseous abnormality. IMPRESSION: 1. Moderate left hydroureteronephrosis with fairly extensive left perinephric edema/inflammation/hemorrhage. Assessment is limited by lack of intravenous contrast material. Left ureteral fullness extends into the pelvis. No obstructing calculus identified. Findings in the left retroperitoneal space may reflect urine leak/caliceal rupture, pyelonephritis/urinary tract infection, sequelae of neoplasm such as urothelial etiology. 2. Markedly distended urinary bladder likely contributes to appearance of the left hydroureteronephrosis. Associated prostatomegaly raises the question of bladder outlet obstruction. 3. Mild right hydroureteronephrosis with mild right perinephric edema. 4. Small volume free fluid in the abdomen and pelvis. 5. Diffuse body wall edema in the abdomen and pelvis. 6. Patchy airspace disease in both lower lobes with small bilateral pleural effusions. Imaging features suggest multifocal pneumonia. 7. Common bile duct measures 7 mm diameter, upper normal to mildly enlarged for patient age. Correlation with liver function tests recommended. Electronically Signed   By: Camellia Candle M.D.   On: 05/15/2024 13:56   DG Chest Port 1 View Result Date: 05/15/2024 CLINICAL  DATA:  Shortness of breath. EXAM: PORTABLE CHEST 1 VIEW COMPARISON:  Chest radiograph dated 11/25/2023. FINDINGS: There is cardiomegaly with vascular congestion and edema. No focal consolidation, pleural effusion or pneumothorax. No acute osseous pathology. IMPRESSION: Cardiomegaly with vascular congestion and edema. Electronically Signed   By: Vanetta Chou M.D.   On: 05/15/2024 13:16    Labs: BNP (last 3 results) Recent Labs  06/26/23 1023  BNP 290.0*   Basic Metabolic Panel: Recent Labs  Lab 05/15/24 1227 05/15/24 1302 05/16/24 0333 05/17/24 0512 05/18/24 0540 05/19/24 0452  NA 137 140 144 145 141 140  K 4.6 4.7 4.5 4.2 4.2 4.0  CL 105 108 112* 113* 110 112*  CO2 21*  --  20* 22 20* 21*  GLUCOSE 116* 110* 87 115* 120* 108*  BUN 93* 81* 66* 33* 23 19  CREATININE 6.63* 6.70* 3.89* 2.01* 1.93* 1.45*  CALCIUM  8.1*  --  8.4* 8.1* 8.1* 7.8*   Liver Function Tests: Recent Labs  Lab 05/15/24 1227  AST 29  ALT 56*  ALKPHOS 101  BILITOT 0.9  PROT 5.2*  ALBUMIN 2.0*   Recent Labs  Lab 05/15/24 1227  LIPASE 34   No results for input(s): AMMONIA in the last 168 hours. CBC: Recent Labs  Lab 05/15/24 1227 05/15/24 1302 05/16/24 0333 05/17/24 0512 05/18/24 0540 05/19/24 0452  WBC 27.7*  --  23.9* 19.0* 19.9* 18.1*  NEUTROABS  --   --  20.4*  --   --   --   HGB 11.3* 11.6* 11.6* 11.7* 12.0* 11.7*  HCT 34.0* 34.0* 34.9* 35.4* 36.0* 36.4*  MCV 94.4  --  93.3 94.1 94.7 95.3  PLT 400  --  394 438* 430* 445*   CBG: Recent Labs  Lab 05/15/24 1232  GLUCAP 122*  No results for input(s): TSH, T4TOTAL, T3FREE, THYROIDAB in the last 72 hours.  Invalid input(s): FREET3 Urinalysis    Component Value Date/Time   COLORURINE YELLOW 05/15/2024 1226   APPEARANCEUR HAZY (A) 05/15/2024 1226   LABSPEC 1.012 05/15/2024 1226   PHURINE 5.0 05/15/2024 1226   GLUCOSEU >=500 (A) 05/15/2024 1226   HGBUR SMALL (A) 05/15/2024 1226   BILIRUBINUR NEGATIVE 05/15/2024  1226   BILIRUBINUR negative 11/25/2023 1027   BILIRUBINUR Negative 01/31/2015 1010   KETONESUR NEGATIVE 05/15/2024 1226   PROTEINUR NEGATIVE 05/15/2024 1226   UROBILINOGEN 1.0 11/25/2023 1027   UROBILINOGEN 1.0 06/12/2012 1315   NITRITE NEGATIVE 05/15/2024 1226   LEUKOCYTESUR SMALL (A) 05/15/2024 1226   Sepsis Labs Recent Labs  Lab 05/16/24 0333 05/17/24 0512 05/18/24 0540 05/19/24 0452  WBC 23.9* 19.0* 19.9* 18.1*   Microbiology Recent Results (from the past 240 hours)  Urine Culture     Status: None   Collection Time: 05/15/24 12:26 PM   Specimen: Urine, Random  Result Value Ref Range Status   Specimen Description URINE, RANDOM  Final   Special Requests NONE Reflexed from [id number]  Final   Culture   Final    NO GROWTH Performed at [facility], [address] [place], [place] [address]    Report Status 05/16/2024 FINAL  Final  Blood Culture (routine x 2)     Status: None (Preliminary result)   Collection Time: 05/15/24  1:27 PM   Specimen: BLOOD RIGHT ARM  Result Value Ref Range Status   Specimen Description BLOOD RIGHT ARM  Final   Special Requests   Final    BOTTLES DRAWN AEROBIC AND ANAEROBIC Blood Culture adequate volume   Culture   Final    NO GROWTH 4 DAYS Performed at [facility], [address] [place], [place] [address]    Report Status PENDING  Incomplete  Blood Culture (routine x 2)     Status: None (Preliminary result)   Collection Time: 05/15/24  2:04 PM   Specimen: BLOOD LEFT HAND  Result Value Ref Range Status  Specimen Description BLOOD LEFT HAND  Final   Special Requests   Final    AEROBIC BOTTLE ONLY Blood Culture results may not be optimal due to an inadequate volume of blood received in culture bottles   Culture   Final    NO GROWTH 4 DAYS Performed at [facility], [address] [place], [place] [address]    Report Status PENDING  Incomplete  Resp panel by RT-PCR (RSV, Flu A&B, Covid) Anterior Nasal Swab     Status:  None   Collection Time: 05/15/24  2:30 PM   Specimen: Anterior Nasal Swab  Result Value Ref Range Status   SARS Coronavirus 2 by RT PCR NEGATIVE NEGATIVE Final   Influenza A by PCR NEGATIVE NEGATIVE Final   Influenza B by PCR NEGATIVE NEGATIVE Final    Comment: (NOTE) The Xpert Xpress SARS-CoV-2/FLU/RSV plus assay is intended as an aid in the diagnosis of influenza from Nasopharyngeal swab specimens and should not be used as a sole basis for treatment. Nasal washings and aspirates are unacceptable for Xpert Xpress SARS-CoV-2/FLU/RSV testing.  Fact Sheet for Patients: BloggerCourse.com  Fact Sheet for Healthcare Providers: SeriousBroker.it  This test is not yet approved or cleared by the [place]  FDA and has been authorized for detection and/or diagnosis of SARS-CoV-2 by FDA under an Emergency Use Authorization (EUA). This EUA will remain in effect (meaning this test can be used) for the duration of the COVID-19 declaration under Section 564(b)(1) of the Act, 21 U.S.C. section 360bbb-3(b)(1), unless the authorization is terminated or revoked.     Resp Syncytial Virus by PCR NEGATIVE NEGATIVE Final    Comment: (NOTE) Fact Sheet for Patients: BloggerCourse.com  Fact Sheet for Healthcare Providers: SeriousBroker.it  This test is not yet approved or cleared by the [place]  FDA and has been authorized for detection and/or diagnosis of SARS-CoV-2 by FDA under an Emergency Use Authorization (EUA). This EUA will remain in effect (meaning this test can be used) for the duration of the COVID-19 declaration under Section 564(b)(1) of the Act, 21 U.S.C. section 360bbb-3(b)(1), unless the authorization is terminated or revoked.  Performed at [facility], [address] [place], [place] [address]    Time coordinating discharge: 35 minutes  SIGNED: Mennie LAMY,  MD  [facility] 05/19/2024, 10:43 AM  If 7PM-7AM, please contact night-coverage [email or web address]

## 2024-05-19 NOTE — Care Management Important Message (Signed)
 Important Message  Patient Details  Name: Jordan Kennedy MRN: [id number] Date of Birth: 06-Oct-1956   Important Message Given:  Yes - Medicare IM     Claretta Deed 05/19/2024, 10:45 AM

## 2024-05-19 NOTE — TOC Progression Note (Addendum)
 Transition of Care [facility]) - Progression Note    Patient Details  Name: Jordan Kennedy MRN: [id number] Date of Birth: 01-24-1956  Transition of Care [facility]) CM/SW Contact  Nola Devere Hands, RN Phone Number: 05/19/2024, 10:46 AM  Clinical Narrative:    Case manager contacted Arna, Liaison with [facility] to update patient HHneeds. Patient will discharge with foley catheter.  11:56am  Case Manager spoke with patient's guardian, Sherleen regarding catheter. CM contacted patient's bedside RN and connected them to discuss patient's care and discharge.    Expected Discharge Plan: Home w Home Health Services Barriers to Discharge: No Barriers Identified               Expected Discharge Plan and Services   Discharge Planning Services: CM Consult Post Acute Care Choice: Home Health Living arrangements for the past 2 months: Apartment Expected Discharge Date: 05/19/24               DME Arranged: N/A         HH Arranged: RN HH Agency: [facility] Date HH Agency Contacted: 05/19/24 Time HH Agency Contacted: 1046 Representative spoke with at [facility] Agency: [facility]   Social Drivers of Health (SDOH) Interventions SDOH Screenings   Food Insecurity: No Food Insecurity (11/05/2023)  Housing: Unknown (11/05/2023)  Transportation Needs: No Transportation Needs (11/05/2023)  Utilities: Not At Risk (11/05/2023)  Alcohol Screen: Low Risk  (11/05/2023)  Depression (PHQ2-9): Low Risk  (11/05/2023)  Financial Resource Strain: Low Risk  (11/05/2023)  Physical Activity: Inactive (11/05/2023)  Social Connections: Moderately Isolated (11/05/2023)  Stress: No Stress Concern Present (11/05/2023)  Tobacco Use: Medium Risk (05/15/2024)  Health Literacy: Adequate Health Literacy (11/05/2023)    Readmission Risk Interventions     No data to display

## 2024-05-19 NOTE — Progress Notes (Signed)
 Subjective: CC: Abdominal pain improved. Tolerating soft diet without n/v. Wants cereal for breakfast. 11 BM's documented overnight.   Objective: Vital signs in last 24 hours: Temp:  [98 F (36.7 C)-99.2 F (37.3 C)] 98.5 F (36.9 C) (07/29 0806) Pulse Rate:  [91-98] 91 (07/29 0806) Resp:  [16-19] 19 (07/29 0806) BP: (105-134)/(87-94) 134/87 (07/29 0806) SpO2:  [94 %-98 %] 96 % (07/29 0806) Last BM Date : 05/17/24  Intake/Output from previous day: 07/28 0701 - 07/29 0700 In: 1368.6 [P.O.:240; I.V.:1128.6] Out: 2950 [Urine:2950] Intake/Output this shift: No intake/output data recorded.  PE: Gen:  Alert, NAD, pleasant Abd: Soft, mild distension, NT  Lab Results:  Recent Labs    05/18/24 0540 05/19/24 0452  WBC 19.9* 18.1*  HGB 12.0* 11.7*  HCT 36.0* 36.4*  PLT 430* 445*   BMET Recent Labs    05/18/24 0540 05/19/24 0452  NA 141 140  K 4.2 4.0  CL 110 112*  CO2 20* 21*  GLUCOSE 120* 108*  BUN 23 19  CREATININE 1.93* 1.45*  CALCIUM  8.1* 7.8*   PT/INR No results for input(s): LABPROT, INR in the last 72 hours. CMP     Component Value Date/Time   NA 140 05/19/2024 0452   NA 143 05/09/2023 1146   K 4.0 05/19/2024 0452   CL 112 (H) 05/19/2024 0452   CO2 21 (L) 05/19/2024 0452   GLUCOSE 108 (H) 05/19/2024 0452   BUN 19 05/19/2024 0452   BUN 17 05/09/2023 1146   CREATININE 1.45 (H) 05/19/2024 0452   CREATININE 0.92 09/14/2015 1637   CALCIUM  7.8 (L) 05/19/2024 0452   PROT 5.2 (L) 05/15/2024 1227   PROT 7.1 05/09/2023 1146   ALBUMIN 2.0 (L) 05/15/2024 1227   ALBUMIN 4.5 05/09/2023 1146   AST 29 05/15/2024 1227   ALT 56 (H) 05/15/2024 1227   ALKPHOS 101 05/15/2024 1227   BILITOT 0.9 05/15/2024 1227   BILITOT 0.3 05/09/2023 1146   GFRNONAA 53 (L) 05/19/2024 0452   GFRNONAA >89 09/14/2015 1637   GFRAA >60 07/11/2018 0542   GFRAA >89 09/14/2015 1637   Lipase     Component Value Date/Time   LIPASE 34 05/15/2024 1227     Studies/Results: No results found.  Anti-infectives: Anti-infectives (From admission, onward)    Start     Dose/Rate Route Frequency Ordered Stop   05/15/24 1300  ceFEPIme  (MAXIPIME ) 2 g in sodium chloride  0.9 % 100 mL IVPB        2 g 200 mL/hr over 30 Minutes Intravenous  Once 05/15/24 1257 05/15/24 1427   05/15/24 1300  metroNIDAZOLE  (FLAGYL ) IVPB 500 mg        500 mg 100 mL/hr over 60 Minutes Intravenous  Once 05/15/24 1257 05/15/24 1504   05/15/24 1300  vancomycin  (VANCOCIN ) IVPB 1000 mg/200 mL premix  Status:  Discontinued        1,000 mg 200 mL/hr over 60 Minutes Intravenous  Once 05/15/24 1257 05/15/24 1258   05/15/24 1300  vancomycin  (VANCOREADY) IVPB 2000 mg/400 mL        2,000 mg 200 mL/hr over 120 Minutes Intravenous  Once 05/15/24 1258 05/15/24 1606        Assessment/Plan SBO - Clinically and radiolographically resolving. Contrast in colon on xray 7/26. Tolerating soft diet without n/v, having bowel function and NT on exam. General surgery will sign off.   FEN - Soft diet. IVF per primary  VTE - SCDs, okay for chem ppx from a  general surgery standpoint ID - None currently.  Foley - Per Urology for bladder outlet obstruction   I reviewed nursing notes, last 24 h vitals and pain scores, last 48 h intake and output, last 24 h labs and trends, and last 24 h imaging results.   LOS: 4 days    Jordan Kennedy, [facility] 05/19/2024, 9:21 AM Please see Amion for pager number during day hours 7:00am-4:30pm

## 2024-05-19 NOTE — Plan of Care (Signed)

## 2024-05-19 NOTE — Progress Notes (Signed)
 Physical Therapy Treatment Patient Details Name: Jordan Kennedy MRN: [id number] DOB: 1956/04/05 Today's Date: 05/19/2024   History of Present Illness Pt is a 68 y.o. male admitted 05/15/24 with abdominal discomfort, hypotension. Workup for AKI, bladder outlet obstruction, L hydroureteronephrosis, SBO; NGT placed but pt pulled out. PMH includes schizophrenia, intellectual disability, tardive dyskinesia, chronic constipation, ileus, SBO, BPH.    PT Comments  Pt making slow, steady progress overall with functional mobility. He again was very perseverative on eating and food in general. PT assisted with ordering his lunch and provided snacks approved by RN at end of session. Pt would continue to benefit from skilled physical therapy services at this time while admitted and after d/c to address the below listed limitations in order to improve overall safety and independence with functional mobility.     If plan is discharge home, recommend the following: A little help with walking and/or transfers;A little help with bathing/dressing/bathroom;Assistance with cooking/housework;Assist for transportation;Help with stairs or ramp for entrance;Supervision due to cognitive status;Direct supervision/assist for medications management;Direct supervision/assist for financial management   Can travel by private vehicle        Equipment Recommendations  None recommended by PT    Recommendations for Other Services       Precautions / Restrictions Precautions Precautions: Fall;Other (comment) (foley) Recall of Precautions/Restrictions: Impaired Precaution/Restrictions Comments: foley, bowel incontinent Restrictions Weight Bearing Restrictions Per Provider Order: No     Mobility  Bed Mobility Overal bed mobility: Modified Independent                  Transfers Overall transfer level: Needs assistance Equipment used: Rolling walker (2 wheels) Transfers: Sit to/from Stand Sit to Stand:  Supervision           General transfer comment: pt standing from EOB without difficulties    Ambulation/Gait Ambulation/Gait assistance: Contact guard assist Gait Distance (Feet): 20 Feet Assistive device: Rolling walker (2 wheels) Gait Pattern/deviations: Step-through pattern, Decreased stride length, Shuffle Gait velocity: decreased     General Gait Details: pt ambulating within his room with use of RW and CGA for safety   Stairs             Wheelchair Mobility     Tilt Bed    Modified Rankin (Stroke Patients Only)       Balance Overall balance assessment: Needs assistance Sitting-balance support: No upper extremity supported Sitting balance-Leahy Scale: Good Sitting balance - Comments: pt able to don socks independently in long sitting in bed   Standing balance support: Single extremity supported, Bilateral upper extremity supported Standing balance-Leahy Scale: Poor                              Communication Communication Communication: Impaired Factors Affecting Communication: Reduced clarity of speech  Cognition Arousal: Alert Behavior During Therapy: Restless   PT - Cognitive impairments: History of cognitive impairments                       PT - Cognition Comments: h/o schizophrenia, cognitive impairment. pt perseverating on eating; multiple times requesting a cookie, ice cream and a Sprite Following commands: Impaired Following commands impaired: Follows one step commands inconsistently    Cueing Cueing Techniques: Verbal cues, Gestural cues  Exercises      General Comments        Pertinent Vitals/Pain Pain Assessment Pain Assessment: No/denies pain    Home Living  Prior Function            PT Goals (current goals can now be found in the care plan section) Acute Rehab PT Goals PT Goal Formulation: Patient unable to participate in goal setting Time For Goal Achievement:  05/30/24 Potential to Achieve Goals: Good Progress towards PT goals: Progressing toward goals    Frequency    Min 2X/week      PT Plan      Co-evaluation              AM-PAC PT 6 Clicks Mobility   Outcome Measure  Help needed turning from your back to your side while in a flat bed without using bedrails?: None Help needed moving from lying on your back to sitting on the side of a flat bed without using bedrails?: None Help needed moving to and from a bed to a chair (including a wheelchair)?: A Little Help needed standing up from a chair using your arms (e.g., wheelchair or bedside chair)?: A Little Help needed to walk in hospital room?: A Little Help needed climbing 3-5 steps with a railing? : A Lot 6 Click Score: 19    End of Session   Activity Tolerance: Patient tolerated treatment well Patient left: in chair;with call bell/phone within reach;with chair alarm set Nurse Communication: Mobility status PT Visit Diagnosis: Other abnormalities of gait and mobility (R26.89);Muscle weakness (generalized) (M62.81)     Time: 8873-8852 PT Time Calculation (min) (ACUTE ONLY): 21 min  Charges:    $Therapeutic Activity: 8-22 mins PT General Charges $$ ACUTE PT VISIT: 1 Visit                     [name] [name], DPT  Acute Rehabilitation Services Office [phone]    [name] HERO Anaja Monts 05/19/2024, 12:37 PM

## 2024-05-20 ENCOUNTER — Telehealth: Payer: Self-pay | Admitting: Family Medicine

## 2024-05-20 ENCOUNTER — Telehealth: Payer: Self-pay

## 2024-05-20 LAB — CULTURE, BLOOD (ROUTINE X 2)
Culture: NO GROWTH
Culture: NO GROWTH
Special Requests: ADEQUATE

## 2024-05-20 LAB — CLOZAPINE (CLOZARIL): Total(Cloz+Norcloz): UNDETERMINED ng/mL

## 2024-05-20 NOTE — Transitions of Care (Post Inpatient/ED Visit) (Signed)
   05/20/2024  Name: Jordan Kennedy MRN: [id number] DOB: 1956/07/06  Today's TOC FU Call Status: Today's TOC FU Call Status:: Unsuccessful Call (1st Attempt) Unsuccessful Call (1st Attempt) Date: 05/20/24  Attempted to reach the patient regarding the most recent Inpatient/Jordan visit.  Follow Up Plan: Additional outreach attempts will be made to reach the patient to complete the Transitions of Care (Post Inpatient/Jordan visit) call.   Richerd Fish, RN, BSN, CCM [facility], [facility] RN Care Manager Direct Dial: [phone]

## 2024-05-20 NOTE — Telephone Encounter (Signed)
 Copied from CRM [id number]. Topic: Appointments - Scheduling Inquiry for Clinic >> May 20, 2024  8:25 AM Robinson H wrote: Reason for CRM: Patients caregiver calling to schedule a hospital follow up in 3 days, patient was discharged yesterday and has a cather that needs to be checked and possibly removed.  Ibrahim Koroma [phone]  Florence Jung and let him know to call [facility]. He understood.

## 2024-05-20 NOTE — Telephone Encounter (Signed)
 Noted. Dm/cma

## 2024-05-21 ENCOUNTER — Other Ambulatory Visit: Payer: Self-pay | Admitting: Family Medicine

## 2024-05-21 ENCOUNTER — Telehealth: Payer: Self-pay

## 2024-05-21 DIAGNOSIS — K5901 Slow transit constipation: Secondary | ICD-10-CM

## 2024-05-21 NOTE — Transitions of Care (Post Inpatient/ED Visit) (Signed)
 05/21/2024  Name: Jordan Kennedy MRN: [id number] DOB: [birth year]  Today's TOC FU Call Status: Today's TOC FU Call Status:: Successful TOC FU Call Completed TOC FU Call Complete Date: 05/21/24 Patient's Name and Date of Birth confirmed.  Transition Care Management Follow-up Telephone Call Date of Discharge: 05/19/24 Discharge Facility: [facility]) Type of Discharge: Inpatient Admission Primary Inpatient Discharge Diagnosis:: Bladder outlet pbstruction How have you been since you were released from the hospital?: Better Any questions or concerns?: No  Items Reviewed: Did you receive and understand the discharge instructions provided?: Yes Medications obtained,verified, and reconciled?: Partial Review Completed Reason for Partial Mediation Review: Caregiver did not have meds in front of him Any new allergies since your discharge?: No Dietary orders reviewed?: NA Do you have support at home?:  (patient's caregiven GLENWOOD Soles answering questions)  Medications Reviewed Today: with Ibrahim Medications Reviewed Today     Reviewed by Eilleen Richerd GRADE, RN (Registered Nurse) on 05/21/24 at 1345  Med List Status: <None>   Medication Order Taking? Sig Documenting Provider Last Dose Status Informant  amantadine  (SYMMETREL ) 100 MG capsule [id number] No Take 100 mg by mouth at bedtime.  [provider] 05/14/2024 Active Care Giver, Multiple Informants, Pharmacy Records  aspirin  EC (ASPIRIN  EC ADULT LOW DOSE) 81 MG tablet [id number] No TAKE 1 TABLET BY MOUTH EVERY DAY [name] HERO, MD 05/15/2024 Active Care Giver, Multiple Informants, Pharmacy Records  cloZAPine  (CLOZARIL ) 100 MG tablet [id number] No Take 2 tablets (200 mg total) by mouth 2 (two) times daily. Sebastian Toribio GAILS, MD 05/14/2024 Active Care Giver, Multiple Informants, Pharmacy Records           Med Note (COFFELL, JON HERO [place] Jan 02, 2023  9:54 AM)    dapagliflozin  propanediol (FARXIGA ) 10 MG TABS tablet [id number] No  TAKE 1 TABLET BY MOUTH EVERY DAY [name] HERO, MD 05/15/2024 Active Care Giver, Multiple Informants, Pharmacy Records  finasteride  (PROSCAR ) 5 MG tablet [id number]  Take 1 tablet (5 mg total) by mouth daily. Christobal Gu[place]aj[place], MD  Active   furosemide  (LASIX ) 40 MG tablet [id number] No Take 1 tablet (40 mg total) by mouth as needed.  Patient taking differently: Take 40 mg by mouth as needed for edema or fluid.   [name] HERO, MD Unknown Active Care Giver, Multiple Informants, Pharmacy Records  LINZESS  290 MCG CAPS capsule [id number]  TAKE 1 CAPSULE BY MOUTH EVERY MORNING BEFORE BREAKFAST [name] HERO, MD  Active   Melatonin 10 MG SUBL [id number] No Place 10 mg under the tongue at bedtime. [provider] 05/14/2024 Active Care Giver, Multiple Informants, Pharmacy Records  metoCLOPramide  (REGLAN ) 5 MG tablet [id number] No TAKE 1 TABLET BY MOUTH 2 TIMES DAILY [name] HERO, MD 05/15/2024 Active Care Giver, Multiple Informants, Pharmacy Records  metoprolol  succinate (TOPROL -XL) 25 MG 24 hr tablet [id number] No TAKE 1/2 TABLET BY MOUTH EVERY DAY [name] HERO, MD 05/15/2024 Active Care Giver, Multiple Informants, Pharmacy Records  midodrine  (PROAMATINE ) 5 MG tablet [id number] No Take 1 tablet (5 mg total) by mouth 2 (two) times daily with a meal. Tobb, Kardie, DO 05/15/2024 Active Care Giver, Multiple Informants, Pharmacy Records  MYRBETRIQ  50 MG TB24 tablet [id number] No Take 50 mg by mouth daily. [provider] 05/14/2024 Active Care Giver, Multiple Informants, Pharmacy Records  omeprazole  (PRILOSEC) 40 MG capsule [id number] No TAKE 1 CAPSULE BY MOUTH EVERY EVENING [name] HERO, MD 05/14/2024 Active Care Giver, Multiple Informants, Pharmacy Records  PARoxetine  (PAXIL ) 40 MG  tablet [id number] No Take 40 mg by mouth every evening.  [provider] 05/14/2024 Active Care Giver, Multiple Informants, Pharmacy Records  polyethylene glycol powder (GLYCOLAX /MIRALAX ) 17 GM/SCOOP powder [id number]  No Take 17 g by mouth See admin instructions. 17 g twice weekly on Monday and Thursday. [name] HERO, MD Past Week Active Care Giver, Multiple Informants, Pharmacy Records  sacubitril -valsartan  (ENTRESTO ) 24-26 MG [id number] No TAKE 1 TABLET BY MOUTH 2 TIMES DAILY [name] HERO, MD 05/15/2024 Active Care Giver, Multiple Informants, Pharmacy Records  SENNA PLUS 8.6-50 MG tablet [id number] No TAKE 1 TABLET BY MOUTH 2 TIMES DAILY [name] HERO, MD 05/14/2024 Active Care Giver, Multiple Informants, Pharmacy Records  tamsulosin  (FLOMAX ) 0.4 MG CAPS capsule [id number] No Take 0.4 mg by mouth daily after supper. [provider] 05/14/2024 Active Care Giver, Multiple Informants, Pharmacy Records  zolpidem  (AMBIEN ) 10 MG tablet [id number] No Take 1 tablet (10 mg total) by mouth at bedtime. [name] HERO, MD 05/14/2024 Active Care Giver, Multiple Informants, Pharmacy Records  Med List Note Steffi Nian, CPhT 05/15/24 1535): Pill pack from [facility] caregiver ibrahim            Home Care and Equipment/Supplies: Were Home Health Services Ordered?: Yes Name of Home Health Agency:: [facility]  Patient unable to participate in assessment he was asleep and caregiver handle affairs.    Follow up appointments reviewed: attempted to explain need for PCP follow up and he states he tried to get appointment but patient needed urology appointment that he is working towards.  PCP Follow-up appointment confirmed?:  Ary states he called PCP and they state to call Urology, offerred to assist with appointment and Sherleen declines stating he would make appointments) Specialist Hospital Follow-up appointment confirmed?:  (05/27/24 Cardiology appointment)    Richerd Fish, RN, BSN, CCM [place]  [facility], [facility] RN Care Manager Direct Dial: [phone]

## 2024-05-27 ENCOUNTER — Ambulatory Visit (INDEPENDENT_AMBULATORY_CARE_PROVIDER_SITE_OTHER): Admitting: Podiatry

## 2024-05-27 ENCOUNTER — Encounter: Payer: Self-pay | Admitting: Podiatry

## 2024-05-27 ENCOUNTER — Encounter: Payer: Self-pay | Admitting: Cardiology

## 2024-05-27 ENCOUNTER — Ambulatory Visit: Attending: Cardiology | Admitting: Cardiology

## 2024-05-27 VITALS — BP 102/68 | HR 98 | Ht 72.0 in | Wt 188.0 lb

## 2024-05-27 DIAGNOSIS — M79672 Pain in left foot: Secondary | ICD-10-CM

## 2024-05-27 DIAGNOSIS — B351 Tinea unguium: Secondary | ICD-10-CM | POA: Diagnosis not present

## 2024-05-27 DIAGNOSIS — R931 Abnormal findings on diagnostic imaging of heart and coronary circulation: Secondary | ICD-10-CM | POA: Diagnosis present

## 2024-05-27 DIAGNOSIS — I509 Heart failure, unspecified: Secondary | ICD-10-CM | POA: Diagnosis present

## 2024-05-27 DIAGNOSIS — M79671 Pain in right foot: Secondary | ICD-10-CM

## 2024-05-27 NOTE — Progress Notes (Signed)
 Cardiology Office Note:    Date:  05/27/2024   ID:  Jordan Kennedy, DOB 14-Feb-1956, MRN [id number]  PCP:  Jordan Garnette HERO, MD  Cardiologist:  Dub Huntsman, DO  Electrophysiologist:  None   Referring MD: Jordan Garnette HERO, MD    I am ok  History of Present Illness:    Jordan Kennedy is a 68 y.o. male with a hx of cognitive Paramed, tardive dyskinesia and depressed ejection fraction.  Of note at the time of diagnosis of depressed ejection fraction/dilated cardiomyopathy shared decisions were made to not proceed with cardiac catheterization as the patient does have poor intellectual abilities and does not follow directions and there was concern for this procedure that he would be able to lie flat.  He was also referred to EP and same concern for device implantation due to this.  He is here today he has been placed in a different group home he is not with the usual caregiver that brought him for his visit for the last 2 visits.  He does have a catheter as he was recently admitted at [facility].  No chest pain or shortness of breath.   Past Medical History:  Diagnosis Date   AKI (acute kidney injury) (HCC) 06/29/2018   Anemia    Anemia, iron deficiency 09/18/2015   Bowel obstruction (HCC)    BPH (benign prostatic hyperplasia)    Chronic constipation    Chronic systolic heart failure (HCC)    History of ileus    Ileus (HCC) 03/07/2017   Mental retardation    Partial bowel obstruction (HCC)    Schizophrenia (HCC)    Monarche every three months;    SIRS (systemic inflammatory response syndrome) (HCC) 06/29/2018   Small bowel obstruction (HCC) 06/29/2018    No past surgical history on file.  Current Medications: Current Meds  Medication Sig   amantadine  (SYMMETREL ) 100 MG capsule Take 100 mg by mouth at bedtime.    cloZAPine  (CLOZARIL ) 100 MG tablet Take 2 tablets (200 mg total) by mouth 2 (two) times daily.   finasteride  (PROSCAR ) 5 MG tablet Take 1 tablet (5 mg  total) by mouth daily.   furosemide  (LASIX ) 40 MG tablet Take 1 tablet (40 mg total) by mouth as needed. (Patient taking differently: Take 40 mg by mouth as needed for edema or fluid.)   LINZESS  290 MCG CAPS capsule TAKE 1 CAPSULE BY MOUTH EVERY MORNING BEFORE BREAKFAST   Melatonin 10 MG SUBL Place 10 mg under the tongue at bedtime.   metoCLOPramide  (REGLAN ) 5 MG tablet TAKE 1 TABLET BY MOUTH 2 TIMES DAILY   midodrine  (PROAMATINE ) 5 MG tablet Take 1 tablet (5 mg total) by mouth 2 (two) times daily with a meal.   omeprazole  (PRILOSEC) 40 MG capsule TAKE 1 CAPSULE BY MOUTH EVERY EVENING   polyethylene glycol powder (GLYCOLAX /MIRALAX ) 17 GM/SCOOP powder Take 17 g by mouth See admin instructions. 17 g twice weekly on Monday and Thursday.   sacubitril -valsartan  (ENTRESTO ) 24-26 MG TAKE 1 TABLET BY MOUTH 2 TIMES DAILY   SENNA PLUS 8.6-50 MG tablet TAKE 1 TABLET BY MOUTH 2 TIMES DAILY   tamsulosin  (FLOMAX ) 0.4 MG CAPS capsule Take 0.4 mg by mouth daily after supper.     Allergies:   Patient has no known allergies.   Social History   Socioeconomic History   Marital status: Single    Spouse name: Not on file   Number of children: Not on file   Years of education: Not on  file   Highest education level: Not on file  Occupational History   Not on file  Tobacco Use   Smoking status: Former    Current packs/day: 0.25    Average packs/day: 0.3 packs/day for 30.0 years (7.5 ttl pk-yrs)    Types: Cigarettes   Smokeless tobacco: Never  Vaping Use   Vaping status: Never Used  Substance and Sexual Activity   Alcohol use: No   Drug use: No   Sexual activity: Never  Other Topics Concern   Not on file  Social History Narrative   Marital status: single      Children: none       Employment: disability due to mental retardation and schizophrenia.      Lives: group home [name]; 3 men in group home.        Tobacco: six cigarettes per day; since age 47.       Alcohol:  None      Drugs: none      Exercise:  Walking some.      Seatbelt:  100%   Social Drivers of Corporate investment banker Strain: Low Risk  (11/05/2023)   Overall Financial Resource Strain (CARDIA)    Difficulty of Paying Living Expenses: Not hard at all  Food Insecurity: No Food Insecurity (11/05/2023)   Hunger Vital Sign    Worried About Running Out of Food in the Last Year: Never true    Ran Out of Food in the Last Year: Never true  Transportation Needs: No Transportation Needs (11/05/2023)   PRAPARE - Administrator, Civil Service (Medical): No    Lack of Transportation (Non-Medical): No  Physical Activity: Inactive (11/05/2023)   Exercise Vital Sign    Days of Exercise per Week: 0 days    Minutes of Exercise per Session: 0 min  Stress: No Stress Concern Present (11/05/2023)   [employer] of Occupational Health - Occupational Stress Questionnaire    Feeling of Stress : Not at all  Social Connections: Moderately Isolated (11/05/2023)   Social Connection and Isolation Panel    Frequency of Communication with Friends and Family: Once a week    Frequency of Social Gatherings with Friends and Family: More than three times a week    Attends Religious Services: 1 to 4 times per year    Active Member of [employer] or Organizations: No    Attends Banker Meetings: Never    Marital Status: Never married     Family History: The patient's family history includes Cancer in his mother.  ROS:   Review of Systems  Constitution: Negative for decreased appetite, fever and weight gain.  HENT: Negative for congestion, ear discharge, hoarse voice and sore throat.   Eyes: Negative for discharge, redness, vision loss in right eye and visual halos.  Cardiovascular: Negative for chest pain, dyspnea on exertion, leg swelling, orthopnea and palpitations.  Respiratory: Negative for cough, hemoptysis, shortness of breath and snoring.   Endocrine: Negative for heat  intolerance and polyphagia.  Hematologic/Lymphatic: Negative for bleeding problem. Does not bruise/bleed easily.  Skin: Negative for flushing, nail changes, rash and suspicious lesions.  Musculoskeletal: Negative for arthritis, joint pain, muscle cramps, myalgias, neck pain and stiffness.  Gastrointestinal: Negative for abdominal pain, bowel incontinence, diarrhea and excessive appetite.  Genitourinary: Negative for decreased libido, genital sores and incomplete emptying.  Neurological: Negative for brief paralysis, focal weakness, headaches and loss of balance.  Psychiatric/Behavioral: Negative for  altered mental status, depression and suicidal ideas.  Allergic/Immunologic: Negative for HIV exposure and persistent infections.    EKGs/Labs/Other Studies Reviewed:    The following studies were reviewed today:   EKG:  The ekg ordered today demonstrates   Recent Labs: 06/25/2023: Magnesium  2.1 06/26/2023: B Natriuretic Peptide 290.0 05/15/2024: ALT 56 05/19/2024: BUN 19; Creatinine, Ser 1.45; Hemoglobin 11.7; Platelets 445; Potassium 4.0; Sodium 140  Recent Lipid Panel    Component Value Date/Time   CHOL 144 01/31/2021 1000   TRIG 143.0 01/31/2021 1000   HDL 41.40 01/31/2021 1000   CHOLHDL 3 01/31/2021 1000   VLDL 28.6 01/31/2021 1000   LDLCALC 74 01/31/2021 1000    Physical Exam:    VS:  BP 102/68 (BP Location: Left Arm, Patient Position: Sitting)   Pulse 98   Ht 6' (1.829 m)   Wt 188 lb (85.3 kg)   SpO2 100%   BMI 25.50 kg/m     Wt Readings from Last 3 Encounters:  05/27/24 188 lb (85.3 kg)  05/15/24 219 lb 9.3 oz (99.6 kg)  05/15/24 219 lb 9.6 oz (99.6 kg)     GEN: Well nourished, well developed in no acute distress HEENT: Normal NECK: No JVD; No carotid bruits LYMPHATICS: No lymphadenopathy CARDIAC: S1S2 noted,RRR, no murmurs, rubs, gallops RESPIRATORY:  Clear to auscultation without rales, wheezing or rhonchi  ABDOMEN: Soft, non-tender, non-distended, +bowel  sounds, no guarding. EXTREMITIES: No edema, No cyanosis, no clubbing MUSCULOSKELETAL:  No deformity  SKIN: Warm and dry NEUROLOGIC:  Alert and oriented x 3, non-focal PSYCHIATRIC:  Normal affect, good insight  ASSESSMENT:    1. Depressed left ventricular ejection fraction   2. Heart failure with reduced ejection fraction      PLAN:     Hypotension - blood pressure improved on midodrine  but still on the lower side.  Continue entresto , farxiga  and toprol  xl. Keep off aldactone  for now   Depressed ejection fraction -he has been on low-dose Entresto , Farxiga  and Toprol  XL.  Will get a repeat echocardiogram today.   The patient is in agreement with the above plan. The patient left the office in stable condition.  The patient will follow up in 6 months.   Medication Adjustments/Labs and Tests Ordered: Current medicines are reviewed at length with the patient today.  Concerns regarding medicines are outlined above.  Orders Placed This Encounter  Procedures   ECHOCARDIOGRAM COMPLETE   No orders of the defined types were placed in this encounter.   Patient Instructions  Medication Instructions:  Your physician recommends that you continue on your current medications as directed. Please refer to the Current Medication list given to you today.  *If you need a refill on your cardiac medications before your next appointment, please call your pharmacy*  Testing/Procedures: Your physician has requested that you have an echocardiogram. Echocardiography is a painless test that uses sound waves to create images of your heart. It provides your doctor with information about the size and shape of your heart and how well your heart's chambers and valves are working. This procedure takes approximately one hour. There are no restrictions for this procedure. Please do NOT wear cologne, perfume, aftershave, or lotions (deodorant is allowed). Please arrive 15 minutes prior to your appointment  time.  Please note: We ask at that you not bring children with you during ultrasound (echo/ vascular) testing. Due to room size and safety concerns, children are not allowed in the ultrasound rooms during exams. Our front office staff cannot  provide observation of children in our lobby area while testing is being conducted. An adult accompanying a patient to their appointment will only be allowed in the ultrasound room at the discretion of the ultrasound technician under special circumstances. We apologize for any inconvenience.   Follow-Up: At [facility], you and your health needs are our priority.  As part of our continuing mission to provide you with exceptional heart care, our providers are all part of one team.  This team includes your primary Cardiologist (physician) and Advanced Practice Providers or APPs (Physician Assistants and Nurse Practitioners) who all work together to provide you with the care you need, when you need it.  Your next appointment:   6 month(s)  Provider:   Pansey Pinheiro, DO           Adopting a Healthy Lifestyle.  Know what a healthy weight is for you (roughly BMI <25) and aim to maintain this   Aim for 7+ servings of fruits and vegetables daily   65-80+ fluid ounces of water or unsweet tea for healthy kidneys   Limit to max 1 drink of alcohol per day; avoid smoking/tobacco   Limit animal fats in diet for cholesterol and heart health - choose grass fed whenever available   Avoid highly processed foods, and foods high in saturated/trans fats   Aim for low stress - take time to unwind and care for your mental health   Aim for 150 min of moderate intensity exercise weekly for heart health, and weights twice weekly for bone health   Aim for 7-9 hours of sleep daily   When it comes to diets, agreement about the perfect plan isnt easy to find, even among the experts. Experts at the [facility] [employer] developed an idea known as the  Healthy Eating Plate. Just imagine a plate divided into logical, healthy portions.   The emphasis is on diet quality:   Load up on vegetables and fruits - one-half of your plate: Aim for color and variety, and remember that potatoes dont count.   Go for whole grains - one-quarter of your plate: Whole wheat, barley, wheat berries, quinoa, oats, brown rice, and foods made with them. If you want pasta, go with whole wheat pasta.   Protein power - one-quarter of your plate: Fish, chicken, beans, and nuts are all healthy, versatile protein sources. Limit red meat.   The diet, however, does go beyond the plate, offering a few other suggestions.   Use healthy plant oils, such as olive, canola, soy, corn, sunflower and peanut. Check the labels, and avoid partially hydrogenated oil, which have unhealthy trans fats.   If youre thirsty, drink water. Coffee and tea are good in moderation, but skip sugary drinks and limit milk and dairy products to one or two daily servings.   The type of carbohydrate in the diet is more important than the amount. Some sources of carbohydrates, such as vegetables, fruits, whole grains, and beans-are healthier than others.   Finally, stay active  Signed, Jamey Demchak, DO  05/27/2024 10:27 AM    [facility]

## 2024-05-27 NOTE — Progress Notes (Signed)
 Patient presents for evaluation and treatment of tenderness and some redness around nails feet.  Tenderness around toes with walking and wearing shoes.  Physical exam:  General appearance: Alert, pleasant, and in no acute distress.  Vascular: Pedal pulses: DP 1/4 B/L, PT 0/4 B/L. Moderate edema lower legs bilaterally  Neurological:    Dermatologic:  Nails thickened, disfigured, discolored 1-5 BL with subungual debris.  Redness and hypertrophic nail folds along nail folds bilaterally but no signs of drainage or infection.  Musculoskeletal:     Diagnosis: 1. Painful onychomycotic nails 1 through 5 bilaterally. 2. Pain toes 1 through 5 bilaterally.  Plan: Debrided onychomycotic nails 1 through 5 bilaterally.  Return 3 months RFC

## 2024-05-27 NOTE — Patient Instructions (Signed)
 Medication Instructions:  Your physician recommends that you continue on your current medications as directed. Please refer to the Current Medication list given to you today.  *If you need a refill on your cardiac medications before your next appointment, please call your pharmacy*  Testing/Procedures: Your physician has requested that you have an echocardiogram. Echocardiography is a painless test that uses sound waves to create images of your heart. It provides your doctor with information about the size and shape of your heart and how well your heart's chambers and valves are working. This procedure takes approximately one hour. There are no restrictions for this procedure. Please do NOT wear cologne, perfume, aftershave, or lotions (deodorant is allowed). Please arrive 15 minutes prior to your appointment time.  Please note: We ask at that you not bring children with you during ultrasound (echo/ vascular) testing. Due to room size and safety concerns, children are not allowed in the ultrasound rooms during exams. Our front office staff cannot provide observation of children in our lobby area while testing is being conducted. An adult accompanying a patient to their appointment will only be allowed in the ultrasound room at the discretion of the ultrasound technician under special circumstances. We apologize for any inconvenience.   Follow-Up: At [facility], you and your health needs are our priority.  As part of our continuing mission to provide you with exceptional heart care, our providers are all part of one team.  This team includes your primary Cardiologist (physician) and Advanced Practice Providers or APPs (Physician Assistants and Nurse Practitioners) who all work together to provide you with the care you need, when you need it.  Your next appointment:   6 month(s)  Provider:   Kardie Tobb, DO

## 2024-05-28 ENCOUNTER — Other Ambulatory Visit: Payer: Self-pay | Admitting: Family Medicine

## 2024-05-28 DIAGNOSIS — K5901 Slow transit constipation: Secondary | ICD-10-CM

## 2024-06-01 DIAGNOSIS — N1330 Unspecified hydronephrosis: Secondary | ICD-10-CM | POA: Diagnosis not present

## 2024-06-01 DIAGNOSIS — F209 Schizophrenia, unspecified: Secondary | ICD-10-CM

## 2024-06-01 DIAGNOSIS — D509 Iron deficiency anemia, unspecified: Secondary | ICD-10-CM | POA: Diagnosis not present

## 2024-06-01 DIAGNOSIS — F79 Unspecified intellectual disabilities: Secondary | ICD-10-CM

## 2024-06-01 DIAGNOSIS — N179 Acute kidney failure, unspecified: Secondary | ICD-10-CM | POA: Diagnosis not present

## 2024-06-01 DIAGNOSIS — K5909 Other constipation: Secondary | ICD-10-CM

## 2024-06-01 DIAGNOSIS — M47816 Spondylosis without myelopathy or radiculopathy, lumbar region: Secondary | ICD-10-CM

## 2024-06-01 DIAGNOSIS — N400 Enlarged prostate without lower urinary tract symptoms: Secondary | ICD-10-CM

## 2024-06-01 DIAGNOSIS — I5022 Chronic systolic (congestive) heart failure: Secondary | ICD-10-CM

## 2024-06-01 DIAGNOSIS — K469 Unspecified abdominal hernia without obstruction or gangrene: Secondary | ICD-10-CM

## 2024-06-01 DIAGNOSIS — I110 Hypertensive heart disease with heart failure: Secondary | ICD-10-CM

## 2024-06-01 DIAGNOSIS — N320 Bladder-neck obstruction: Secondary | ICD-10-CM | POA: Diagnosis not present

## 2024-06-02 ENCOUNTER — Encounter: Payer: Self-pay | Admitting: Family Medicine

## 2024-06-02 ENCOUNTER — Ambulatory Visit: Admitting: Family Medicine

## 2024-06-02 VITALS — BP 114/66 | HR 103 | Temp 98.2°F | Ht 72.0 in | Wt 178.8 lb

## 2024-06-02 DIAGNOSIS — N138 Other obstructive and reflux uropathy: Secondary | ICD-10-CM | POA: Diagnosis not present

## 2024-06-02 DIAGNOSIS — N1330 Unspecified hydronephrosis: Secondary | ICD-10-CM | POA: Diagnosis not present

## 2024-06-02 DIAGNOSIS — N401 Enlarged prostate with lower urinary tract symptoms: Secondary | ICD-10-CM

## 2024-06-02 DIAGNOSIS — N320 Bladder-neck obstruction: Secondary | ICD-10-CM

## 2024-06-02 MED ORDER — FINASTERIDE 5 MG PO TABS: 5.0000 mg | ORAL_TABLET | Freq: Every day | ORAL | 3 refills | Status: AC

## 2024-06-02 NOTE — Assessment & Plan Note (Addendum)
 Jordan Kennedy had labs checked by his psychiatrist 2 days ago. His case worker will request that the results be faxed to me so we can determine if any additional labs are needed to follow up Jordan Kennedy's AKI. I will continue finasteride  5 mg daily and tamsulosin  0.4 mg daily.

## 2024-06-02 NOTE — Assessment & Plan Note (Deleted)
-   As above

## 2024-06-02 NOTE — Progress Notes (Signed)
 [facility] [address] [place] [address] Dept: [phone] Dept Fax: [phone]  Hospital Follow-Up Visit  Subjective:    Patient ID: Jordan Kennedy, male    DOB: 24-May-1956, 68 y.o..   MRN: [id number]  Chief Complaint  Patient presents with   Hospitalization Follow-up    Hospital f/u from 05/15/24.  No concerns.    History of Present Illness:  Patient is in today for follow-up from a recent hospitalization. Jordan Kennedy was admitted at [facility] from 7/25-7/29/2025 with acute abdominal pain. He was found to be hypotensive and tachypneic. His labs showed a  creatinine of 6.6, WBC count of 27.2 and an elevated lactate. He was found to have left hydroureteronephrosis with fairly extensive left perinephric  edema/inflammation/hemorrhage w/ L hydroureteronephrosis and prostatomegaly.  He had a foley catheter placed, was give broad spectrum antibiotics and urology was consulted. The source of the obstruction was due to BPH. At discharge, his renal function had improved somewhat. He still has a bladder catheter in place. He has seen urology in follow-up. They apparently attempted to remove his catheter, but Jordan Kennedy still was unable to urinate. His caretaker, Jordan Kennedy, notes it has been a challenge to manage keeping the catheter in place, as Jordan Kennedy pulls at it due to apparent discomfort.   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 06/02/2024   Bladder outlet obstruction 05/15/2024   Enuresis 06/19/2023   Left bundle branch block 02/13/2023   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   Tinea pedis of both feet 01/11/2023   Chronic systolic heart failure (HCC) 01/03/2023   Prolonged QT interval 01/01/2023   Transaminitis 01/01/2023    Thrombocytosis 01/01/2023   Tardive dyskinesia 11/20/2021   BPH with obstruction/lower urinary tract symptoms 05/02/2021   History of small bowel obstruction 01/31/2021   History of colon polyps 01/31/2021   Onychomycosis 01/31/2021   Gastroesophageal reflux disease 01/31/2021   Tobacco use disorder, mild, in sustained remission 02/01/2015   Constipation due to slow transit 04/25/2012   Anxiety and depression 04/25/2012   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 No pertinent surgical history. Family History  Problem Relation Age of Onset   Cancer Mother        unknown type   Outpatient Medications Prior to Visit  Medication Sig Dispense Refill   amantadine  (SYMMETREL ) 100 MG capsule Take 100 mg by mouth at bedtime.      aspirin  EC (ASPIRIN  EC ADULT LOW DOSE) 81 MG tablet TAKE 1 TABLET BY MOUTH EVERY DAY 90 tablet 3   cloZAPine  (CLOZARIL ) 100 MG tablet Take 2 tablets (200 mg total) by mouth 2 (two) times daily. 120 tablet 0   dapagliflozin  propanediol (FARXIGA ) 10 MG TABS tablet TAKE 1 TABLET BY MOUTH EVERY DAY 90 tablet 3   furosemide  (LASIX ) 40 MG tablet Take 1 tablet (40 mg total) by mouth as needed. (Patient taking differently: Take 40 mg by mouth as needed for edema or fluid.) 45 tablet 3   LINZESS  290 MCG CAPS capsule TAKE 1 CAPSULE BY MOUTH EVERY MORNING BEFORE BREAKFAST 30 capsule 4   Melatonin 10 MG SUBL Place 10 mg under the tongue at bedtime.     metoCLOPramide  (REGLAN ) 5 MG tablet TAKE 1 TABLET BY  MOUTH 2 TIMES DAILY 60 tablet 2   metoprolol  succinate (TOPROL -XL) 25 MG 24 hr tablet TAKE 1/2 TABLET BY MOUTH EVERY DAY 90 tablet 3   midodrine  (PROAMATINE ) 5 MG tablet Take 1 tablet (5 mg total) by mouth 2 (two) times daily with a meal. 180 tablet 3   MYRBETRIQ  50 MG TB24 tablet Take 50 mg by mouth daily.     omeprazole  (PRILOSEC) 40 MG capsule TAKE 1 CAPSULE BY MOUTH EVERY EVENING 90 capsule 3   PARoxetine  (PAXIL ) 40 MG  tablet Take 40 mg by mouth every evening.      polyethylene glycol powder (GLYCOLAX /MIRALAX ) 17 GM/SCOOP powder Take 17 g by mouth See admin instructions. 17 g twice weekly on Monday and Thursday. 510 g 11   sacubitril -valsartan  (ENTRESTO ) 24-26 MG TAKE 1 TABLET BY MOUTH 2 TIMES DAILY 180 tablet 3   SENNA PLUS 8.6-50 MG tablet TAKE 1 TABLET BY MOUTH 2 TIMES DAILY 180 tablet 1   tamsulosin  (FLOMAX ) 0.4 MG CAPS capsule Take 0.4 mg by mouth daily after supper.     zolpidem  (AMBIEN ) 10 MG tablet Take 1 tablet (10 mg total) by mouth at bedtime. 30 tablet 5   finasteride  (PROSCAR ) 5 MG tablet Take 1 tablet (5 mg total) by mouth daily. 30 tablet 0   finasteride  (PROSCAR ) 5 MG tablet Take 5 mg by mouth daily.     No facility-administered medications prior to visit.   No Known Allergies   Objective:   Today's Vitals   06/02/24 1055  BP: 114/66  Pulse: (!) 103  Temp: 98.2 F (36.8 C)  TempSrc: Temporal  SpO2: 95%  Weight: 178 lb 12.8 oz (81.1 kg)  Height: 6' (1.829 m)   Body mass index is 24.25 kg/m.   General: Well developed, well nourished. No acute distress. Psych: Alert and oriented. Normal mood and affect.  Health Maintenance Due  Topic Date Due   Zoster Vaccines- Shingrix (1 of 2) Never done     Imaging: CT of Abdomen and Pelvis wo contrast (05/15/2024) IMPRESSION: 1. Moderate left hydroureteronephrosis with fairly extensive left perinephric edema/inflammation/hemorrhage. Assessment is limited by lack of intravenous contrast material. Left ureteral fullness extends into the pelvis. No obstructing calculus identified. Findings in the left retroperitoneal space may reflect urine leak/caliceal rupture, pyelonephritis/urinary tract infection, sequelae of neoplasm such as urothelial etiology. 2. Markedly distended urinary bladder likely contributes to appearance of the left hydroureteronephrosis. Associated prostatomegaly raises the question of bladder outlet obstruction. 3. Mild right  hydroureteronephrosis with mild right perinephric edema. 4. Small volume free fluid in the abdomen and pelvis. 5. Diffuse body wall edema in the abdomen and pelvis. 6. Patchy airspace disease in both lower lobes with small bilateral pleural effusions. Imaging features suggest multifocal pneumonia. 7. Common bile duct measures 7 mm diameter, upper normal to mildly enlarged for patient age. Correlation with liver function tests recommended.  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 His case worker will request that the results be faxed to me so we can determine if any additional labs are needed to follow up Jordan Kennedy's AKI. I will continue finasteride  5 mg daily and tamsulosin  0.4 mg daily.      Relevant Medications   finasteride  (PROSCAR ) 5 MG tablet  Return for Follow-up as scheduled.   Garnette CHRISTELLA Simpler, MD

## 2024-06-09 ENCOUNTER — Emergency Department (HOSPITAL_COMMUNITY)

## 2024-06-09 ENCOUNTER — Inpatient Hospital Stay (HOSPITAL_COMMUNITY)
Admission: EM | Admit: 2024-06-09 | Discharge: 2024-06-17 | DRG: 698 | Disposition: A | Discharge status: Home Health | Attending: Internal Medicine | Admitting: Internal Medicine

## 2024-06-09 ENCOUNTER — Other Ambulatory Visit: Payer: Self-pay

## 2024-06-09 ENCOUNTER — Encounter (HOSPITAL_COMMUNITY): Payer: Self-pay | Admitting: Emergency Medicine

## 2024-06-09 DIAGNOSIS — R6521 Severe sepsis with septic shock: Secondary | ICD-10-CM | POA: Diagnosis present

## 2024-06-09 DIAGNOSIS — E860 Dehydration: Secondary | ICD-10-CM | POA: Diagnosis present

## 2024-06-09 DIAGNOSIS — E872 Acidosis, unspecified: Secondary | ICD-10-CM | POA: Diagnosis present

## 2024-06-09 DIAGNOSIS — I5022 Chronic systolic (congestive) heart failure: Secondary | ICD-10-CM | POA: Diagnosis present

## 2024-06-09 DIAGNOSIS — E871 Hypo-osmolality and hyponatremia: Secondary | ICD-10-CM | POA: Diagnosis present

## 2024-06-09 DIAGNOSIS — T83518A Infection and inflammatory reaction due to other urinary catheter, initial encounter: Principal | ICD-10-CM | POA: Diagnosis present

## 2024-06-09 DIAGNOSIS — R410 Disorientation, unspecified: Secondary | ICD-10-CM

## 2024-06-09 DIAGNOSIS — N3001 Acute cystitis with hematuria: Secondary | ICD-10-CM

## 2024-06-09 DIAGNOSIS — N390 Urinary tract infection, site not specified: Secondary | ICD-10-CM | POA: Diagnosis present

## 2024-06-09 DIAGNOSIS — F209 Schizophrenia, unspecified: Secondary | ICD-10-CM | POA: Diagnosis present

## 2024-06-09 DIAGNOSIS — A419 Sepsis, unspecified organism: Secondary | ICD-10-CM | POA: Diagnosis present

## 2024-06-09 DIAGNOSIS — N179 Acute kidney failure, unspecified: Principal | ICD-10-CM | POA: Diagnosis present

## 2024-06-09 DIAGNOSIS — Z79899 Other long term (current) drug therapy: Secondary | ICD-10-CM

## 2024-06-09 DIAGNOSIS — Y846 Urinary catheterization as the cause of abnormal reaction of the patient, or of later complication, without mention of misadventure at the time of the procedure: Secondary | ICD-10-CM | POA: Diagnosis present

## 2024-06-09 DIAGNOSIS — I5082 Biventricular heart failure: Secondary | ICD-10-CM | POA: Diagnosis present

## 2024-06-09 DIAGNOSIS — I447 Left bundle-branch block, unspecified: Secondary | ICD-10-CM | POA: Diagnosis present

## 2024-06-09 DIAGNOSIS — N401 Enlarged prostate with lower urinary tract symptoms: Secondary | ICD-10-CM | POA: Diagnosis present

## 2024-06-09 DIAGNOSIS — G9341 Metabolic encephalopathy: Secondary | ICD-10-CM | POA: Diagnosis present

## 2024-06-09 DIAGNOSIS — Z87891 Personal history of nicotine dependence: Secondary | ICD-10-CM

## 2024-06-09 DIAGNOSIS — N136 Pyonephrosis: Secondary | ICD-10-CM | POA: Diagnosis present

## 2024-06-09 DIAGNOSIS — B9620 Unspecified Escherichia coli [E. coli] as the cause of diseases classified elsewhere: Secondary | ICD-10-CM | POA: Diagnosis present

## 2024-06-09 DIAGNOSIS — Z1152 Encounter for screening for COVID-19: Secondary | ICD-10-CM

## 2024-06-09 DIAGNOSIS — I9589 Other hypotension: Secondary | ICD-10-CM | POA: Diagnosis present

## 2024-06-09 DIAGNOSIS — R5381 Other malaise: Secondary | ICD-10-CM | POA: Diagnosis present

## 2024-06-09 DIAGNOSIS — F79 Unspecified intellectual disabilities: Secondary | ICD-10-CM | POA: Diagnosis present

## 2024-06-09 DIAGNOSIS — Z7982 Long term (current) use of aspirin: Secondary | ICD-10-CM

## 2024-06-09 LAB — CBC WITH DIFFERENTIAL/PLATELET
Abs Granulocyte: 17 K/uL — ABNORMAL HIGH (ref 1.5–6.5)
Abs Immature Granulocytes: 0.15 K/uL — ABNORMAL HIGH (ref 0.00–0.07)
Basophils Absolute: 0 K/uL (ref 0.0–0.1)
Basophils Relative: 0 %
Eosinophils Absolute: 0 K/uL (ref 0.0–0.5)
Eosinophils Relative: 0 %
HCT: 34.5 % — ABNORMAL LOW (ref 39.0–52.0)
Hemoglobin: 11 g/dL — ABNORMAL LOW (ref 13.0–17.0)
Immature Granulocytes: 1 %
Lymphocytes Relative: 5 %
Lymphs Abs: 1 K/uL (ref 0.7–4.0)
MCH: 30.1 pg (ref 26.0–34.0)
MCHC: 31.9 g/dL (ref 30.0–36.0)
MCV: 94.3 fL (ref 80.0–100.0)
Monocytes Absolute: 2.4 K/uL — ABNORMAL HIGH (ref 0.1–1.0)
Monocytes Relative: 12 %
Neutro Abs: 17 K/uL — ABNORMAL HIGH (ref 1.7–7.7)
Neutrophils Relative %: 82 %
Platelets: 342 K/uL (ref 150–400)
RBC: 3.66 MIL/uL — ABNORMAL LOW (ref 4.22–5.81)
RDW: 16.5 % — ABNORMAL HIGH (ref 11.5–15.5)
Smear Review: NORMAL
WBC: 20.6 K/uL — ABNORMAL HIGH (ref 4.0–10.5)
nRBC: 0 % (ref 0.0–0.2)

## 2024-06-09 LAB — URINALYSIS, ROUTINE W REFLEX MICROSCOPIC
Bilirubin Urine: NEGATIVE
Glucose, UA: >=500 mg/dL — AB
Ketones, ur: NEGATIVE mg/dL
Nitrite: NEGATIVE
Protein, ur: >=300 mg/dL — AB
RBC / HPF: >50 RBC/hpf (ref 0–5)
Specific Gravity, Urine: 1.015 (ref 1.005–1.030)
WBC, UA: >50 WBC/hpf (ref 0–5)
pH: 5 (ref 5.0–8.0)

## 2024-06-09 LAB — HEPATIC FUNCTION PANEL
ALT: 23 U/L (ref 0–44)
AST: 34 U/L (ref 15–41)
Albumin: 2.4 g/dL — ABNORMAL LOW (ref 3.5–5.0)
Alkaline Phosphatase: 110 U/L (ref 38–126)
Bilirubin, Direct: 0.2 mg/dL (ref 0.0–0.2)
Indirect Bilirubin: 0.3 mg/dL (ref 0.3–0.9)
Total Bilirubin: 0.5 mg/dL (ref 0.0–1.2)
Total Protein: 6.2 g/dL — ABNORMAL LOW (ref 6.5–8.1)

## 2024-06-09 LAB — BASIC METABOLIC PANEL WITH GFR
Anion gap: 11 (ref 5–15)
BUN: 48 mg/dL — ABNORMAL HIGH (ref 8–23)
CO2: 22 mmol/L (ref 22–32)
Calcium: 8 mg/dL — ABNORMAL LOW (ref 8.9–10.3)
Chloride: 101 mmol/L (ref 98–111)
Creatinine, Ser: 4.07 mg/dL — ABNORMAL HIGH (ref 0.61–1.24)
GFR, Estimated: 15 mL/min — ABNORMAL LOW (ref >60)
Glucose, Bld: 150 mg/dL — ABNORMAL HIGH (ref 70–99)
Potassium: 4.1 mmol/L (ref 3.5–5.1)
Sodium: 134 mmol/L — ABNORMAL LOW (ref 135–145)

## 2024-06-09 LAB — LIPASE, BLOOD: Lipase: 24 U/L (ref 11–51)

## 2024-06-09 LAB — RESP PANEL BY RT-PCR (RSV, FLU A&B, COVID)  RVPGX2
Influenza A by PCR: NEGATIVE
Influenza B by PCR: NEGATIVE
Resp Syncytial Virus by PCR: NEGATIVE
SARS Coronavirus 2 by RT PCR: NEGATIVE

## 2024-06-09 LAB — AMMONIA: Ammonia: 16 umol/L (ref 9–35)

## 2024-06-09 MED ORDER — CEFTRIAXONE SODIUM 1 G IJ SOLR
INTRAMUSCULAR | Status: AC
  Filled 2024-06-09: qty 10

## 2024-06-09 MED ORDER — SODIUM CHLORIDE 0.9 % IV SOLN
1.0000 g | Freq: Once | INTRAVENOUS | Status: AC
  Administered 2024-06-09: 1 g via INTRAVENOUS

## 2024-06-09 MED ORDER — SODIUM CHLORIDE 0.9 % IV BOLUS
1000.0000 mL | Freq: Once | INTRAVENOUS | Status: AC
  Administered 2024-06-09: 1000 mL via INTRAVENOUS

## 2024-06-09 NOTE — Plan of Care (Incomplete)
 68 year old male with history of schizophrenia, intellectual disability, HFrEF/biventricular heart failure, SBO, BPH. Recent hospital admission 7/25-7/29/2025 for acute renal failure secondary to bladder outlet obstruction and left hydroureteronephrosis.  Creatinine was 6.7 on admission on admission and had improved to 1.4 on discharge.  Urology consulted and Flomax  was held.  PSA was elevated.  He was discharged with Foley catheter in place.  Also required NG tube for SBO during this hospitalization.  In addition, patient had significant leukocytosis with WBC count 27.7 on admission with unclear etiology.  Urine and blood cultures showed no growth.  Antibiotics were held and WBC count had improved to 18.1 on discharge.  Patient presents to the ED today via EMS for evaluation of abdominal pain, worsening confusion, and hypotension.  Initial blood pressure with EMS was 80/50 and patient was given 800 mL LR.  Blood pressure had initially improved but then patient later noted to be hypotensive again in the ED with SBP 80.  Afebrile.  Not tachycardic, tachypneic, or hypoxic.  Labs notable for WBC count 20.6, hemoglobin 11.0 (stable compared to recent labs), sodium 134, glucose 150, BUN 48, creatinine 4.0, normal lipase and LFTs, ammonia level normal, COVID/influenza/RSV PCR negative.  UA suggestive of infection (moderate leukocytes, >50 WBCs, many bacteria).  Urine culture in process.  CT head showing no acute intracranial abnormality.  CT abdomen pelvis showing bilateral mild hydroureteronephrosis in the setting of a fully decompressed urinary bladder lumen with associated mild urothelial thickening.  This may reflect chronic changes of obstructive uropathy or reflux.  Showing bilateral trace, right greater than left, pleural effusions.  EKG showing sinus rhythm, QTc 561, LBBB is not new but LAFB appears new.  Patient was given ceftriaxone  and 2 L normal saline in the ED.     Last echo done in July 2024 showing  EF <20%, grade 1 diastolic dysfunction, RV systolic function moderately reduced, mild mitral regurgitation.

## 2024-06-09 NOTE — ED Triage Notes (Signed)
 Patient BIB EMS from home c/o abdominal pain x 1 day. Per report patient worsening confusion tonight. Per EMS initial BP was 80/50. LR Given by EMS. Patient report nausea, denies vomiting.  BP 90/52 HR 88 RR 16 O2sat 98 on RA CBG 154 18G L Wrist.

## 2024-06-09 NOTE — ED Provider Notes (Signed)
  Provider Note MRN:  [id number]  Arrival date & time: 06/09/24    ED Course and Medical Decision Making  Assumed care of patient at sign-out or upon transfer.  Altered mental status in the setting of UTI and AKI plan is for hospitalist admission.  Procedures  Final Clinical Impressions(s) / ED Diagnoses     ICD-10-CM   1. AKI (acute kidney injury) (HCC)  N17.9     2. Acute cystitis with hematuria  N30.01     3. Confusion  R41.0       ED Discharge Orders     None       Discharge Instructions   None     Ozell HERO. Theadore, MD [facility] [email or web address]    Theadore Ozell HERO, MD 06/09/24 2337

## 2024-06-09 NOTE — ED Provider Notes (Addendum)
 [place] EM[facility]GENC[facility] DEPARTMENT AT [facility] Provider Note   CSN: [id number] Arrival date & time: 06/09/24  2041     Patient presents with: Altered Mental Status   Jordan Kennedy is a 68 y.o. male presenting to ED with complaint of confusion.  Patient  is a poor historian and cannot provide specific information.  EMS reports were called to the scene as the patient had been complaining of abdominal pain and family was concerned that he was confused.  Per my review of the patient medical history including discharge summary from 05/19/2024, the patient has a history of schizophrenia, bowel obstruction, BPH, was admitted at time for hydronephrosis and pyelonephritis and an AKI.  He was also had colonic dilatation and SBO findings on CT imaging.   HPI     Prior to Admission medications   Medication Sig Start Date End Date Taking? Authorizing Provider  amantadine  (SYMMETREL ) 100 MG capsule Take 100 mg by mouth at bedtime.     [provider]  aspirin  EC (ASPIRIN  EC ADULT LOW DOSE) 81 MG tablet TAKE 1 TABLET BY MOUTH EVERY DAY 02/07/24   [name] HERO, MD  cloZAPine  (CLOZARIL ) 100 MG tablet Take 2 tablets (200 mg total) by mouth 2 (two) times daily. 03/12/17   Sebastian Toribio GAILS, MD  dapagliflozin  propanediol (FARXIGA ) 10 MG TABS tablet TAKE 1 TABLET BY MOUTH EVERY DAY 02/07/24   [name] HERO, MD  finasteride  (PROSCAR ) 5 MG tablet Take 1 tablet (5 mg total) by mouth daily. 06/02/24 05/28/25  [name] HERO, MD  furosemide  (LASIX ) 40 MG tablet Take 1 tablet (40 mg total) by mouth as needed. Patient taking differently: Take 40 mg by mouth as needed for edema or fluid. 03/24/24   [name] HERO, MD  LINZESS  290 MCG CAPS capsule TAKE 1 CAPSULE BY MOUTH EVERY MORNING BEFORE BREAKFAST 05/21/24   [name] HERO, MD  Melatonin 10 MG SUBL Place 10 mg under the tongue at bedtime.    [provider]  metoCLOPramide  (REGLAN ) 5 MG tablet TAKE 1 TABLET BY MOUTH 2 TIMES DAILY  05/29/24   [name] HERO, MD  metoprolol  succinate (TOPROL -XL) 25 MG 24 hr tablet TAKE 1/2 TABLET BY MOUTH EVERY DAY 02/07/24   [name] HERO, MD  midodrine  (PROAMATINE ) 5 MG tablet Take 1 tablet (5 mg total) by mouth 2 (two) times daily with a meal. 12/04/23   Tobb, Kardie, DO  MYRBETRIQ  50 MG TB24 tablet Take 50 mg by mouth daily. 01/06/21   [provider]  omeprazole  (PRILOSEC) 40 MG capsule TAKE 1 CAPSULE BY MOUTH EVERY EVENING 02/07/24   [name] HERO, MD  PARoxetine  (PAXIL ) 40 MG tablet Take 40 mg by mouth every evening.     [provider]  polyethylene glycol powder (GLYCOLAX /MIRALAX ) 17 GM/SCOOP powder Take 17 g by mouth See admin instructions. 17 g twice weekly on Monday and Thursday. 05/14/24   [name] HERO, MD  sacubitril -valsartan  (ENTRESTO ) 24-26 MG TAKE 1 TABLET BY MOUTH 2 TIMES DAILY 02/07/24   [name] HERO, MD  SENNA PLUS 8.6-50 MG tablet TAKE 1 TABLET BY MOUTH 2 TIMES DAILY 03/19/24   [name] HERO, MD  tamsulosin  (FLOMAX ) 0.4 MG CAPS capsule Take 0.4 mg by mouth daily after supper. 01/10/21   [provider]  zolpidem  (AMBIEN ) 10 MG tablet Take 1 tablet (10 mg total) by mouth at bedtime. 06/19/23   [name] HERO, MD    Allergies: Patient has no known  allergies.    Review of Systems  Updated Vital Signs BP 90/66   Pulse 93   Temp 98 F (36.7 C) (Oral)   Resp (!) 25   SpO2 98%   Physical Exam Constitutional:      General: He is not in acute distress. HENT:     Head: Normocephalic and atraumatic.  Eyes:     Conjunctiva/sclera: Conjunctivae normal.     Pupils: Pupils are equal, round, and reactive to light.  Cardiovascular:     Rate and Rhythm: Normal rate and regular rhythm.  Pulmonary:     Effort: Pulmonary effort is normal. No respiratory distress.  Abdominal:     General: There is distension.     Tenderness: There is no abdominal tenderness.  Skin:    General: Skin is warm and dry.  Neurological:     General: No focal  deficit present.     Mental Status: He is alert and oriented to person, place, and time. Mental status is at baseline.  Psychiatric:        Mood and Affect: Mood normal.        Behavior: Behavior normal.     (all labs ordered are listed, but only abnormal results are displayed) Labs Reviewed  BASIC METABOLIC PANEL WITH GFR - Abnormal; Notable for the following components:      Result Value   Sodium 134 (*)    Glucose, Bld 150 (*)    BUN 48 (*)    Creatinine, Ser 4.07 (*)    Calcium  8.0 (*)    GFR, Estimated 15 (*)    All other components within normal limits  CBC WITH DIFFERENTIAL/PLATELET - Abnormal; Notable for the following components:   WBC 20.6 (*)    RBC 3.66 (*)    Hemoglobin 11.0 (*)    HCT 34.5 (*)    RDW 16.5 (*)    Neutro Abs 17.0 (*)    Monocytes Absolute 2.4 (*)    Abs Immature Granulocytes 0.15 (*)    Abs Granulocyte 17.0 (*)    All other components within normal limits  URINALYSIS, ROUTINE W REFLEX MICROSCOPIC - Abnormal; Notable for the following components:   Color, Urine AMBER (*)    APPearance TURBID (*)    Glucose, UA >=500 (*)    Hgb urine dipstick LARGE (*)    Protein, ur >=300 (*)    Leukocytes,Ua MODERATE (*)    Bacteria, UA MANY (*)    All other components within normal limits  HEPATIC FUNCTION PANEL - Abnormal; Notable for the following components:   Total Protein 6.2 (*)    Albumin 2.4 (*)    All other components within normal limits  RESP PANEL BY RT-PCR (RSV, FLU A&B, COVID)  [id number]  URINE CULTURE  LIPASE, BLOOD  AMMONIA    EKG: EKG Interpretation Date/Time:  Tuesday June 09 2024 20:54:21 EDT Ventricular Rate:  97 PR Interval:  159 QRS Duration:  172 QT Interval:  441 QTC Calculation: 561 R Axis:   -53  Text Interpretation: Sinus rhythm Probable left atrial enlargement Left bundle branch block Confirmed by Cottie Cough [phone]) on 06/09/2024 9:04:18 PM  Radiology: CT ABDOMEN PELVIS WO CONTRAST Result Date:  06/09/2024 CLINICAL DATA:  abd distension abdominal pain x 1 day. Per report patient worsening confusion tonight. Per EMS initial BP was 80/50. LR Given by EMS. Patient report nausea, denies vomiting. EXAM: CT ABDOMEN AND PELVIS WITHOUT CONTRAST TECHNIQUE: Multidetector CT imaging of the abdomen and pelvis was  performed following the standard protocol without IV contrast. RADIATION DOSE REDUCTION: This exam was performed according to the departmental dose-optimization program which includes automated exposure control, adjustment of the mA and/or kV according to patient size and/or use of iterative reconstruction technique. COMPARISON:  None Available. FINDINGS: Lower chest: Diffuse bronchial wall thickening. Bilateral trace, right greater than left, pleural effusions. Cardiomegaly. Hepatobiliary: No focal liver abnormality. No gallstones, gallbladder wall thickening, or pericholecystic fluid. No biliary dilatation. Pancreas: No focal lesion. Normal pancreatic contour. No surrounding inflammatory changes. No main pancreatic ductal dilatation. Spleen: Normal in size without focal abnormality. Adrenals/Urinary Tract: No adrenal nodule bilaterally. Bilateral mild hydroureteronephrosis. Associated mild urothelial thickening. No nephroureterolithiasis bilaterally. The urinary bladder is fully decompressed with Foley tip and inflated balloon terminating within the urinary bladder lumen. Stomach/Bowel: Stomach is within normal limits. No evidence of bowel wall thickening or dilatation. Appendix appears normal. Vascular/Lymphatic: No abdominal aorta or iliac aneurysm. Mild atherosclerotic plaque of the aorta and its branches. No abdominal, pelvic, or inguinal lymphadenopathy. Reproductive: The prostate is enlarged measuring up to 5.7 cm. Other: No intraperitoneal free fluid. No intraperitoneal free gas. No organized fluid collection. Musculoskeletal: No abdominal wall hernia or abnormality. Diffuse subcutaneus soft  tissue edema. No suspicious lytic or blastic osseous lesions. No acute displaced fracture. IMPRESSION: 1. Bilateral mild hydroureteronephrosis in the setting of a fully decompressed urinary bladder lumen. Associated mild urothelial thickening. This may reflect chronic changes of obstructive uropathy or reflux. Correlate with analysis for possible superimposed infection. 2. Cardiomegaly. 3. Bilateral trace, right greater than left, pleural effusions. 4. Subcutaneus soft tissue edema. 5.  Aortic Atherosclerosis (ICD10-I70.0). Electronically Signed   By: Morgane  Naveau M.D.   On: 06/09/2024 22:43   CT Head Wo Contrast Result Date: 06/09/2024 CLINICAL DATA:  Mental status change EXAM: CT HEAD WITHOUT CONTRAST TECHNIQUE: Contiguous axial images were obtained from the base of the skull through the vertex without intravenous contrast. RADIATION DOSE REDUCTION: This exam was performed according to the departmental dose-optimization program which includes automated exposure control, adjustment of the mA and/or kV according to patient size and/or use of iterative reconstruction technique. COMPARISON:  CT head 11/18/2009 FINDINGS: Brain: No intracranial hemorrhage, mass effect, or evidence of acute infarct. No hydrocephalus. No extra-axial fluid collection. Vascular: No hyperdense vessel or unexpected calcification. Skull: No fracture or focal lesion. Sinuses/Orbits: No acute finding. Other: None. IMPRESSION: No acute intracranial abnormality. Electronically Signed   By: Norman Gatlin M.D.   On: 06/09/2024 22:28     Procedures   Medications Ordered in the ED  cefTRIAXone  (ROCEPHIN ) 1 g in sodium chloride  0.9 % 100 mL IVPB (has no administration in time range)  sodium chloride  0.9 % bolus 1,000 mL (0 mLs Intravenous Stopped 06/09/24 2158)    Clinical Course as of 06/09/24 2309  Tue Jun 09, 2024  2309 2nd liter fluid ordered - some hypotension while sleeping, may have underlying dehydration as well. [MT]     Clinical Course User Index [MT] Shekira Drummer, Donnice PARAS, MD                                 Medical Decision Making Amount and/or Complexity of Data Reviewed Labs: ordered. Radiology: ordered.  Risk Decision regarding hospitalization.   This patient presents to the ED with concern for confusion, abdominal distension. This involves an extensive number of treatment options, and is a complaint that carries with it a high risk of complications  and morbidity.  The differential diagnosis includes UTI vs AMS vs metabolic encephalopathy vs SBO vs other  Co-morbidities that complicate the patient evaluation: Hx of CKD, SBO  Additional history obtained from EMS  External records from outside source obtained and reviewed including discharge summary from July  I ordered and personally interpreted labs.  The pertinent results include: Chronically elevated leukocytosis, negative flu and COVID test, AKI with creatinine 4.0 (elevated from Cr 1.4 three weeks ago)  I ordered imaging studies including CT head, CT abdomen pelvis I independently visualized and interpreted imaging which showed suspected hydronephrosis, bilateral I agree with the radiologist interpretation  The patient was maintained on a cardiac monitor.  I personally viewed and interpreted the cardiac monitored which showed an underlying rhythm of: Sinus rhythm  Per my interpretation the patient's ECG shows no acute ischemic findings  I ordered medication including IV fluid for AKI, IV Rocephin  for urinary tract infection  I have reviewed the patients home medicines and have made adjustments as needed  Test Considered: Doubt acute PE, sepsis  After the interventions noted above, I reevaluated the patient and found that they have: improved  Disposition:  After consideration of the diagnostic results and the patient's response to treatment, I feel that the patient would benefit from medical admission for AKI     Final diagnoses:   AKI (acute kidney injury) (HCC)  Acute cystitis with hematuria  Confusion    ED Discharge Orders     None            Cottie Donnice PARAS, MD 06/09/24 2309

## 2024-06-10 DIAGNOSIS — N136 Pyonephrosis: Secondary | ICD-10-CM | POA: Diagnosis present

## 2024-06-10 DIAGNOSIS — I5082 Biventricular heart failure: Secondary | ICD-10-CM | POA: Diagnosis present

## 2024-06-10 DIAGNOSIS — N401 Enlarged prostate with lower urinary tract symptoms: Secondary | ICD-10-CM | POA: Diagnosis present

## 2024-06-10 DIAGNOSIS — I5022 Chronic systolic (congestive) heart failure: Secondary | ICD-10-CM | POA: Diagnosis present

## 2024-06-10 DIAGNOSIS — I447 Left bundle-branch block, unspecified: Secondary | ICD-10-CM | POA: Diagnosis present

## 2024-06-10 DIAGNOSIS — R6521 Severe sepsis with septic shock: Secondary | ICD-10-CM | POA: Diagnosis present

## 2024-06-10 DIAGNOSIS — N179 Acute kidney failure, unspecified: Secondary | ICD-10-CM | POA: Diagnosis present

## 2024-06-10 DIAGNOSIS — Z87891 Personal history of nicotine dependence: Secondary | ICD-10-CM | POA: Diagnosis not present

## 2024-06-10 DIAGNOSIS — E8721 Acute metabolic acidosis: Secondary | ICD-10-CM

## 2024-06-10 DIAGNOSIS — G9341 Metabolic encephalopathy: Secondary | ICD-10-CM | POA: Diagnosis present

## 2024-06-10 DIAGNOSIS — E871 Hypo-osmolality and hyponatremia: Secondary | ICD-10-CM | POA: Diagnosis present

## 2024-06-10 DIAGNOSIS — Z79899 Other long term (current) drug therapy: Secondary | ICD-10-CM | POA: Diagnosis not present

## 2024-06-10 DIAGNOSIS — R579 Shock, unspecified: Secondary | ICD-10-CM | POA: Diagnosis not present

## 2024-06-10 DIAGNOSIS — N390 Urinary tract infection, site not specified: Secondary | ICD-10-CM | POA: Diagnosis not present

## 2024-06-10 DIAGNOSIS — T83518A Infection and inflammatory reaction due to other urinary catheter, initial encounter: Secondary | ICD-10-CM | POA: Diagnosis present

## 2024-06-10 DIAGNOSIS — R5381 Other malaise: Secondary | ICD-10-CM | POA: Diagnosis present

## 2024-06-10 DIAGNOSIS — F209 Schizophrenia, unspecified: Secondary | ICD-10-CM | POA: Diagnosis present

## 2024-06-10 DIAGNOSIS — Z1152 Encounter for screening for COVID-19: Secondary | ICD-10-CM | POA: Diagnosis not present

## 2024-06-10 DIAGNOSIS — Y846 Urinary catheterization as the cause of abnormal reaction of the patient, or of later complication, without mention of misadventure at the time of the procedure: Secondary | ICD-10-CM | POA: Diagnosis present

## 2024-06-10 DIAGNOSIS — E872 Acidosis, unspecified: Secondary | ICD-10-CM | POA: Diagnosis present

## 2024-06-10 DIAGNOSIS — F79 Unspecified intellectual disabilities: Secondary | ICD-10-CM | POA: Diagnosis present

## 2024-06-10 DIAGNOSIS — R410 Disorientation, unspecified: Secondary | ICD-10-CM | POA: Diagnosis present

## 2024-06-10 DIAGNOSIS — E860 Dehydration: Secondary | ICD-10-CM | POA: Diagnosis present

## 2024-06-10 DIAGNOSIS — B9620 Unspecified Escherichia coli [E. coli] as the cause of diseases classified elsewhere: Secondary | ICD-10-CM | POA: Diagnosis present

## 2024-06-10 DIAGNOSIS — I9589 Other hypotension: Secondary | ICD-10-CM | POA: Diagnosis present

## 2024-06-10 DIAGNOSIS — A419 Sepsis, unspecified organism: Secondary | ICD-10-CM | POA: Diagnosis present

## 2024-06-10 DIAGNOSIS — Z7982 Long term (current) use of aspirin: Secondary | ICD-10-CM | POA: Diagnosis not present

## 2024-06-10 LAB — CBC
HCT: 35.4 % — ABNORMAL LOW (ref 39.0–52.0)
Hemoglobin: 11.5 g/dL — ABNORMAL LOW (ref 13.0–17.0)
MCH: 30.4 pg (ref 26.0–34.0)
MCHC: 32.5 g/dL (ref 30.0–36.0)
MCV: 93.7 fL (ref 80.0–100.0)
Platelets: 350 K/uL (ref 150–400)
RBC: 3.78 MIL/uL — ABNORMAL LOW (ref 4.22–5.81)
RDW: 16.4 % — ABNORMAL HIGH (ref 11.5–15.5)
WBC: 21.1 K/uL — ABNORMAL HIGH (ref 4.0–10.5)
nRBC: 0 % (ref 0.0–0.2)

## 2024-06-10 LAB — BASIC METABOLIC PANEL WITH GFR
Anion gap: 10 (ref 5–15)
BUN: 43 mg/dL — ABNORMAL HIGH (ref 8–23)
CO2: 19 mmol/L — ABNORMAL LOW (ref 22–32)
Calcium: 8 mg/dL — ABNORMAL LOW (ref 8.9–10.3)
Chloride: 105 mmol/L (ref 98–111)
Creatinine, Ser: 3.11 mg/dL — ABNORMAL HIGH (ref 0.61–1.24)
GFR, Estimated: 21 mL/min — ABNORMAL LOW (ref >60)
Glucose, Bld: 120 mg/dL — ABNORMAL HIGH (ref 70–99)
Potassium: 4.2 mmol/L (ref 3.5–5.1)
Sodium: 134 mmol/L — ABNORMAL LOW (ref 135–145)

## 2024-06-10 LAB — MAGNESIUM: Magnesium: 1.9 mg/dL (ref 1.7–2.4)

## 2024-06-10 LAB — NA AND K (SODIUM & POTASSIUM), RAND UR
Potassium Urine: 46 mmol/L
Sodium, Ur: 41 mmol/L

## 2024-06-10 LAB — BRAIN NATRIURETIC PEPTIDE: B Natriuretic Peptide: 2208.7 pg/mL — ABNORMAL HIGH (ref 0.0–100.0)

## 2024-06-10 LAB — TROPONIN I (HIGH SENSITIVITY)
Troponin I (High Sensitivity): 34 ng/L — ABNORMAL HIGH (ref <18)
Troponin I (High Sensitivity): 35 ng/L — ABNORMAL HIGH (ref <18)

## 2024-06-10 LAB — MRSA NEXT GEN BY PCR, NASAL: MRSA by PCR Next Gen: DETECTED — AB

## 2024-06-10 LAB — VITAMIN B12: Vitamin B-12: 286 pg/mL (ref 180–914)

## 2024-06-10 LAB — CREATININE, URINE, RANDOM: Creatinine, Urine: 161 mg/dL

## 2024-06-10 LAB — TSH: TSH: 1.1 u[IU]/mL (ref 0.350–4.500)

## 2024-06-10 MED ORDER — POLYETHYLENE GLYCOL 3350 17 G PO PACK
17.0000 g | PACK | Freq: Every day | ORAL | Status: DC
  Administered 2024-06-10 – 2024-06-17 (×6): 17 g via ORAL
  Filled 2024-06-10 (×6): qty 1

## 2024-06-10 MED ORDER — MIDODRINE HCL 5 MG PO TABS
5.0000 mg | ORAL_TABLET | Freq: Two times a day (BID) | ORAL | Status: DC
  Administered 2024-06-10: 5 mg via ORAL
  Filled 2024-06-10: qty 1

## 2024-06-10 MED ORDER — TAMSULOSIN HCL 0.4 MG PO CAPS
0.4000 mg | ORAL_CAPSULE | Freq: Every day | ORAL | Status: DC
  Administered 2024-06-10 – 2024-06-16 (×6): 0.4 mg via ORAL
  Filled 2024-06-10 (×6): qty 1

## 2024-06-10 MED ORDER — MIDODRINE HCL 5 MG PO TABS
10.0000 mg | ORAL_TABLET | Freq: Three times a day (TID) | ORAL | Status: DC
Start: 2024-06-10 — End: 2024-06-10
  Administered 2024-06-10: 10 mg via ORAL
  Filled 2024-06-10: qty 2

## 2024-06-10 MED ORDER — NOREPINEPHRINE 4 MG/250ML-% IV SOLN
0.0000 ug/min | INTRAVENOUS | Status: DC
  Administered 2024-06-10: 2 ug/min via INTRAVENOUS
  Administered 2024-06-10 – 2024-06-11 (×3): 10 ug/min via INTRAVENOUS
  Filled 2024-06-10 (×4): qty 250

## 2024-06-10 MED ORDER — SODIUM CHLORIDE 0.9 % IV SOLN: INTRAVENOUS | Status: DC

## 2024-06-10 MED ORDER — CLOZAPINE 100 MG PO TABS
200.0000 mg | ORAL_TABLET | Freq: Two times a day (BID) | ORAL | Status: DC
  Administered 2024-06-10 – 2024-06-17 (×15): 200 mg via ORAL
  Filled 2024-06-10 (×15): qty 2

## 2024-06-10 MED ORDER — VITAMIN B-12 1000 MCG PO TABS
1000.0000 ug | ORAL_TABLET | Freq: Every day | ORAL | Status: DC
  Administered 2024-06-10 – 2024-06-17 (×8): 1000 ug via ORAL
  Filled 2024-06-10 (×8): qty 1

## 2024-06-10 MED ORDER — ACETAMINOPHEN 650 MG RE SUPP: 650.0000 mg | Freq: Four times a day (QID) | RECTAL | Status: DC | PRN

## 2024-06-10 MED ORDER — HYDROCORTISONE (PERIANAL) 2.5 % EX CREA
1.0000 | TOPICAL_CREAM | Freq: Four times a day (QID) | CUTANEOUS | Status: DC | PRN
  Filled 2024-06-10: qty 28.35

## 2024-06-10 MED ORDER — MUPIROCIN 2 % EX OINT
1.0000 | TOPICAL_OINTMENT | Freq: Two times a day (BID) | CUTANEOUS | Status: AC
  Administered 2024-06-10 – 2024-06-14 (×10): 1 via NASAL
  Filled 2024-06-10 (×2): qty 22

## 2024-06-10 MED ORDER — SODIUM CHLORIDE 0.9 % IV SOLN
250.0000 mL | INTRAVENOUS | Status: AC
  Administered 2024-06-10: 250 mL via INTRAVENOUS

## 2024-06-10 MED ORDER — ZOLPIDEM TARTRATE 5 MG PO TABS: 10.0000 mg | ORAL_TABLET | Freq: Every day | ORAL | Status: DC

## 2024-06-10 MED ORDER — MELATONIN 5 MG PO TABS
10.0000 mg | ORAL_TABLET | Freq: Every day | ORAL | Status: DC
  Administered 2024-06-10 – 2024-06-16 (×7): 10 mg via ORAL
  Filled 2024-06-10 (×8): qty 2

## 2024-06-10 MED ORDER — BISACODYL 10 MG RE SUPP: 10.0000 mg | Freq: Every day | RECTAL | Status: DC | PRN

## 2024-06-10 MED ORDER — ORAL CARE MOUTH RINSE: 15.0000 mL | OROMUCOSAL | Status: DC | PRN

## 2024-06-10 MED ORDER — SODIUM CHLORIDE 0.9 % IV SOLN: 1.0000 g | INTRAVENOUS | Status: DC

## 2024-06-10 MED ORDER — MIDODRINE HCL 5 MG PO TABS
15.0000 mg | ORAL_TABLET | Freq: Three times a day (TID) | ORAL | Status: DC
Start: 2024-06-10 — End: 2024-06-11
  Administered 2024-06-10 – 2024-06-11 (×4): 15 mg via ORAL
  Filled 2024-06-10 (×4): qty 3

## 2024-06-10 MED ORDER — MAGNESIUM SULFATE 2 GM/50ML IV SOLN
2.0000 g | Freq: Once | INTRAVENOUS | Status: AC
  Administered 2024-06-10: 2 g via INTRAVENOUS
  Filled 2024-06-10: qty 50

## 2024-06-10 MED ORDER — LINACLOTIDE 145 MCG PO CAPS
145.0000 ug | ORAL_CAPSULE | Freq: Every day | ORAL | Status: DC
  Administered 2024-06-11 – 2024-06-17 (×7): 145 ug via ORAL
  Filled 2024-06-10 (×7): qty 1

## 2024-06-10 MED ORDER — HEPARIN SODIUM (PORCINE) 5000 UNIT/ML IJ SOLN
5000.0000 [IU] | Freq: Three times a day (TID) | INTRAMUSCULAR | Status: DC
  Administered 2024-06-10 – 2024-06-17 (×21): 5000 [IU] via SUBCUTANEOUS
  Filled 2024-06-10 (×21): qty 1

## 2024-06-10 MED ORDER — LACTATED RINGERS IV BOLUS
1000.0000 mL | Freq: Once | INTRAVENOUS | Status: AC
  Administered 2024-06-10: 1000 mL via INTRAVENOUS

## 2024-06-10 MED ORDER — PAROXETINE HCL 20 MG PO TABS
40.0000 mg | ORAL_TABLET | Freq: Every evening | ORAL | Status: DC
  Administered 2024-06-10 – 2024-06-16 (×7): 40 mg via ORAL
  Filled 2024-06-10 (×8): qty 2

## 2024-06-10 MED ORDER — SODIUM CHLORIDE 0.9 % IV SOLN
2.0000 g | INTRAVENOUS | Status: DC
  Administered 2024-06-10 – 2024-06-11 (×2): 2 g via INTRAVENOUS
  Filled 2024-06-10 (×2): qty 20

## 2024-06-10 MED ORDER — CHLORHEXIDINE GLUCONATE CLOTH 2 % EX PADS
6.0000 | MEDICATED_PAD | Freq: Every day | CUTANEOUS | Status: DC
  Administered 2024-06-10 – 2024-06-17 (×8): 6 via TOPICAL

## 2024-06-10 MED ORDER — ACETAMINOPHEN 325 MG PO TABS
650.0000 mg | ORAL_TABLET | Freq: Four times a day (QID) | ORAL | Status: DC | PRN
  Administered 2024-06-10 – 2024-06-11 (×2): 650 mg via ORAL
  Filled 2024-06-10 (×2): qty 2

## 2024-06-10 MED ORDER — AMANTADINE HCL 100 MG PO CAPS
100.0000 mg | ORAL_CAPSULE | Freq: Every day | ORAL | Status: DC
  Administered 2024-06-10 – 2024-06-16 (×7): 100 mg via ORAL
  Filled 2024-06-10 (×7): qty 1

## 2024-06-10 MED ORDER — SENNOSIDES-DOCUSATE SODIUM 8.6-50 MG PO TABS
1.0000 | ORAL_TABLET | Freq: Two times a day (BID) | ORAL | Status: DC
  Administered 2024-06-10 – 2024-06-17 (×11): 1 via ORAL
  Filled 2024-06-10 (×12): qty 1

## 2024-06-10 NOTE — Progress Notes (Signed)
 Brief same day note:  Patient is a 68 year old male with history of schizophrenia, intellectual disability, HFrEF/biventricular heart failure, SBO, BPH who was recently admitted for acute renal failure secondary to bladder outlet obstruction and left hydroureteronephrosis requiring Foley catheter presented with complaint of abdominal pain, worsening confusion, low blood pressure.  On presentation, he was hypotensive with systolic blood pressure in the range of 80s.  Lab work showed WC count of 20.6, creatinine of 4.  UA suggestive of UTI.  CT head did not show any acute intracranial abnormalities.  CT abdomen/pelvis showed bilateral mild hydroureteronephrosis in the setting of fully decompressed urinary bladder lumen with associated mild urothelial thickening.  He was febrile on presentation.  Admitted for further management of septic shock likely from UTI and also AKI.  Blood pressure consistently in the range of 70s systolic.  Critical care consulted.  Patient seen and examined at bedside today.  During my evaluation, he was overall comfortable, not in any acute distress.  Blood pressure was in the range of 90s systolic during my evaluation which again dropped to 70s.  He is already on midodrine .  He was given IV fluids. He is oriented to place.  Denies shortness of breath or cough.  Assessment and plan:  Septic shock secondary to UTI: Presented with fever, tachycardia, leukocytosis. UA suspicious for UTI.  Continue broad spectrum antibiotics.  Follow-up blood culture, urine culture. Continue midodrine .  Blood pressure fluctuating from 70s systolic to 90s systolic.  Critical care consulted.  May need vasopressors.  AKI: Likely from prerenal etiology, sepsis, hypotension.  Baseline creatinine ranges from 0.8-1.2.  Presented with creatinine in the range of 4. CT showing bilateral mild hydroureteronephrosis in the setting of a fully decompressed urinary bladder lumen with associated mild urothelial  thickening.  Continue gentle IV fluid  Acute metabolic encephalopathy: Most likely from sepsis.  CT head did not show any acute findings.  Ammonia level normal.  He is mostly alert and oriented this morning  Chronic HFrEF/biventricular heart failure: Echo done on July 2 and 24 showed EF of less than 20%, grade 1 diastolic function, moderately reduced right ventricular function.  Appeared dehydrated on presentation.  Continue gentle IVF for now  Prolonged QTc: QTc of 561 on presentation.  Monitor on telemetry.  Monitor electrolytes  History of chronic hypotension: Continue midodrine   BPH/schizophrenia: Home medications being reconciled.

## 2024-06-10 NOTE — H&P (Signed)
 History and Physical    Jordan Kennedy FMW:[id number] DOB: 04/30/1956 DOA: 06/09/2024  PCP: [name] HERO, MD  Chief Complaint: Abdominal pain, altered mental status  HPI: Jordan Kennedy is a 68 y.o. male with medical history significant of schizophrenia, intellectual disability, HFrEF/biventricular heart failure, SBO, BPH. Recent hospital admission 7/25-7/29/2025 for acute renal failure secondary to bladder outlet obstruction and left hydroureteronephrosis requiring Foley catheter.  Creatinine was 6.7 on admission on admission and had improved to 1.4 on discharge.  Also required NG tube for SBO during this hospitalization.  In addition, patient had significant leukocytosis with WBC count 27.7 on admission with unclear etiology.  Urine and blood cultures showed no growth.  Antibiotics were held and WBC count had improved to 18.1 on discharge.   Patient presents to the ED today via EMS for evaluation of abdominal pain, worsening confusion, and hypotension.  Initial blood pressure with EMS was 80/50 and patient was given 800 mL LR.  Blood pressure had initially improved but then patient later noted to be hypotensive again in the ED with SBP 80.  Afebrile.  Not tachycardic, tachypneic, or hypoxic.  Labs notable for WBC count 20.6, hemoglobin 11.0 (stable compared to recent labs), sodium 134, glucose 150, BUN 48, creatinine 4.0, normal lipase and LFTs, ammonia level normal, COVID/influenza/RSV PCR negative.  UA suggestive of infection (moderate leukocytes, >50 WBCs, many bacteria).  Urine culture in process.  CT head showing no acute intracranial abnormality.  CT abdomen pelvis showing bilateral mild hydroureteronephrosis in the setting of a fully decompressed urinary bladder lumen with associated mild urothelial thickening.  This may reflect chronic changes of obstructive uropathy or reflux.  Showing bilateral trace, right greater than left, pleural effusions.  EKG showing sinus rhythm, QTc 561, LBBB is not  new but LAFB appears new.  Patient was given ceftriaxone  and 2 L normal saline in the ED. [facility] called admit.  Patient states the ambulance brought him to the hospital as he was feeling sick but he is not able to elaborate.  Denies cough, shortness of breath, chest pain, nausea, vomiting, abdominal pain, constipation, diarrhea, or any urinary symptoms.  Review of Systems:  Review of Systems  All other systems reviewed and are negative.   Past Medical History:  Diagnosis Date   AKI (acute kidney injury) (HCC) 06/29/2018   Anemia    Anemia, iron deficiency 09/18/2015   Bowel obstruction (HCC)    BPH (benign prostatic hyperplasia)    Chronic constipation    Chronic systolic heart failure (HCC)    History of ileus    Ileus (HCC) 03/07/2017   Mental retardation    Partial bowel obstruction (HCC)    Schizophrenia (HCC)    Monarche every three months;    SIRS (systemic inflammatory response syndrome) (HCC) 06/29/2018   Small bowel obstruction (HCC) 06/29/2018    History reviewed. No pertinent surgical history.   reports that he has quit smoking. His smoking use included cigarettes. He has a 7.5 pack-year smoking history. He has never used smokeless tobacco. He reports that he does not drink alcohol and does not use drugs.  No Known Allergies  Family History  Problem Relation Age of Onset   Cancer Mother        unknown type    Prior to Admission medications   Medication Sig Start Date End Date Taking? Authorizing Provider  amantadine  (SYMMETREL ) 100 MG capsule Take 100 mg by mouth at bedtime.     [provider]  aspirin  EC (ASPIRIN   EC ADULT LOW DOSE) 81 MG tablet TAKE 1 TABLET BY MOUTH EVERY DAY 02/07/24   [name] HERO, MD  cloZAPine  (CLOZARIL ) 100 MG tablet Take 2 tablets (200 mg total) by mouth 2 (two) times daily. 03/12/17   Sebastian Toribio GAILS, MD  dapagliflozin  propanediol (FARXIGA ) 10 MG TABS tablet TAKE 1 TABLET BY MOUTH EVERY DAY 02/07/24   [name] HERO, MD   finasteride  (PROSCAR ) 5 MG tablet Take 1 tablet (5 mg total) by mouth daily. 06/02/24 05/28/25  [name] HERO, MD  furosemide  (LASIX ) 40 MG tablet Take 1 tablet (40 mg total) by mouth as needed. Patient taking differently: Take 40 mg by mouth as needed for edema or fluid. 03/24/24   [name] HERO, MD  LINZESS  290 MCG CAPS capsule TAKE 1 CAPSULE BY MOUTH EVERY MORNING BEFORE BREAKFAST 05/21/24   [name] HERO, MD  Melatonin 10 MG SUBL Place 10 mg under the tongue at bedtime.    [provider]  metoCLOPramide  (REGLAN ) 5 MG tablet TAKE 1 TABLET BY MOUTH 2 TIMES DAILY 05/29/24   [name] HERO, MD  metoprolol  succinate (TOPROL -XL) 25 MG 24 hr tablet TAKE 1/2 TABLET BY MOUTH EVERY DAY 02/07/24   [name] HERO, MD  midodrine  (PROAMATINE ) 5 MG tablet Take 1 tablet (5 mg total) by mouth 2 (two) times daily with a meal. 12/04/23   Tobb, Kardie, DO  MYRBETRIQ  50 MG TB24 tablet Take 50 mg by mouth daily. 01/06/21   [provider]  omeprazole  (PRILOSEC) 40 MG capsule TAKE 1 CAPSULE BY MOUTH EVERY EVENING 02/07/24   [name] HERO, MD  PARoxetine  (PAXIL ) 40 MG tablet Take 40 mg by mouth every evening.     [provider]  polyethylene glycol powder (GLYCOLAX /MIRALAX ) 17 GM/SCOOP powder Take 17 g by mouth See admin instructions. 17 g twice weekly on Monday and Thursday. 05/14/24   [name] HERO, MD  sacubitril -valsartan  (ENTRESTO ) 24-26 MG TAKE 1 TABLET BY MOUTH 2 TIMES DAILY 02/07/24   [name] HERO, MD  SENNA PLUS 8.6-50 MG tablet TAKE 1 TABLET BY MOUTH 2 TIMES DAILY 03/19/24   [name] HERO, MD  tamsulosin  (FLOMAX ) 0.4 MG CAPS capsule Take 0.4 mg by mouth daily after supper. 01/10/21   [provider]  zolpidem  (AMBIEN ) 10 MG tablet Take 1 tablet (10 mg total) by mouth at bedtime. 06/19/23   [name] HERO, MD    Physical Exam: Vitals:   06/09/24 2306 06/09/24 2315 06/09/24 2330 06/09/24 2345  BP: (!) 80/60 (!) 80/56 (!) 82/57 90/63  Pulse: 90 89 88 97  Resp:  20  (!) 23 (!) 21  Temp:      TempSrc:      SpO2: 96% 98% 98% 99%    Physical Exam Vitals reviewed.  Constitutional:      General: He is not in acute distress. HENT:     Head: Normocephalic and atraumatic.  Eyes:     Extraocular Movements: Extraocular movements intact.  Cardiovascular:     Rate and Rhythm: Normal rate and regular rhythm.     Heart sounds: Normal heart sounds.  Pulmonary:     Effort: Pulmonary effort is normal. No respiratory distress.     Breath sounds: No wheezing or rales.  Abdominal:     General: Bowel sounds are normal. There is distension.     Palpations: Abdomen is soft.     Tenderness: There is no abdominal tenderness. There is no guarding.  Musculoskeletal:  Cervical back: Normal range of motion.     Right lower leg: No edema.     Left lower leg: No edema.  Skin:    General: Skin is warm and dry.  Neurological:     General: No focal deficit present.     Mental Status: He is alert.     Cranial Nerves: No cranial nerve deficit.     Sensory: No sensory deficit.     Motor: No weakness.     Comments: Oriented to person and place only, confused Does follow commands appropriately     Labs on Admission: I have personally reviewed following labs and imaging studies  CBC: Recent Labs  Lab 06/09/24 2101  WBC 20.6*  NEUTROABS 17.0*  HGB 11.0*  HCT 34.5*  MCV 94.3  PLT 342   Basic Metabolic Panel: Recent Labs  Lab 06/09/24 2101  NA 134*  K 4.1  CL 101  CO2 22  GLUCOSE 150*  BUN 48*  CREATININE 4.07*  CALCIUM  8.0*   GFR: Estimated Creatinine Clearance: 19.9 mL/min (A) (by C-G formula based on SCr of 4.07 mg/dL (H)). Liver Function Tests: Recent Labs  Lab 06/09/24 2101  AST 34  ALT 23  ALKPHOS 110  BILITOT 0.5  PROT 6.2*  ALBUMIN 2.4*   Recent Labs  Lab 06/09/24 2101  LIPASE 24   Recent Labs  Lab 06/09/24 2101  AMMONIA 16   Coagulation Profile: No results for input(s): INR, PROTIME in the last 168 hours. Cardiac  Enzymes: No results for input(s): CKTOTAL, CKMB, CKMBINDEX, TROPONINI in the last 168 hours. BNP (last 3 results) No results for input(s): PROBNP in the last 8760 hours. HbA1C: No results for input(s): HGBA1C in the last 72 hours. CBG: No results for input(s): GLUCAP in the last 168 hours. Lipid Profile: No results for input(s): CHOL, HDL, LDLCALC, TRIG, CHOLHDL, LDLDIRECT in the last 72 hours. Thyroid Function Tests: No results for input(s): TSH, T4TOTAL, FREET4, T3FREE, THYROIDAB in the last 72 hours. Anemia Panel: No results for input(s): VITAMINB12, FOLATE, FERRITIN, TIBC, IRON, RETICCTPCT in the last 72 hours. Urine analysis:    Component Value Date/Time   COLORURINE AMBER (A) 06/09/2024 2150   APPEARANCEUR TURBID (A) 06/09/2024 2150   LABSPEC 1.015 06/09/2024 2150   PHURINE 5.0 06/09/2024 2150   GLUCOSEU >=500 (A) 06/09/2024 2150   HGBUR LARGE (A) 06/09/2024 2150   BILIRUBINUR NEGATIVE 06/09/2024 2150   BILIRUBINUR negative 11/25/2023 1027   BILIRUBINUR Negative 01/31/2015 1010   KETONESUR NEGATIVE 06/09/2024 2150   PROTEINUR >=300 (A) 06/09/2024 2150   UROBILINOGEN 1.0 11/25/2023 1027   UROBILINOGEN 1.0 06/12/2012 1315   NITRITE NEGATIVE 06/09/2024 2150   LEUKOCYTESUR MODERATE (A) 06/09/2024 2150    Radiological Exams on Admission: CT ABDOMEN PELVIS WO CONTRAST Result Date: 06/09/2024 CLINICAL DATA:  abd distension abdominal pain x 1 day. Per report patient worsening confusion tonight. Per EMS initial BP was 80/50. LR Given by EMS. Patient report nausea, denies vomiting. EXAM: CT ABDOMEN AND PELVIS WITHOUT CONTRAST TECHNIQUE: Multidetector CT imaging of the abdomen and pelvis was performed following the standard protocol without IV contrast. RADIATION DOSE REDUCTION: This exam was performed according to the departmental dose-optimization program which includes automated exposure control, adjustment of the mA and/or kV  according to patient size and/or use of iterative reconstruction technique. COMPARISON:  None Available. FINDINGS: Lower chest: Diffuse bronchial wall thickening. Bilateral trace, right greater than left, pleural effusions. Cardiomegaly. Hepatobiliary: No focal liver abnormality. No gallstones, gallbladder wall thickening,  or pericholecystic fluid. No biliary dilatation. Pancreas: No focal lesion. Normal pancreatic contour. No surrounding inflammatory changes. No main pancreatic ductal dilatation. Spleen: Normal in size without focal abnormality. Adrenals/Urinary Tract: No adrenal nodule bilaterally. Bilateral mild hydroureteronephrosis. Associated mild urothelial thickening. No nephroureterolithiasis bilaterally. The urinary bladder is fully decompressed with Foley tip and inflated balloon terminating within the urinary bladder lumen. Stomach/Bowel: Stomach is within normal limits. No evidence of bowel wall thickening or dilatation. Appendix appears normal. Vascular/Lymphatic: No abdominal aorta or iliac aneurysm. Mild atherosclerotic plaque of the aorta and its branches. No abdominal, pelvic, or inguinal lymphadenopathy. Reproductive: The prostate is enlarged measuring up to 5.7 cm. Other: No intraperitoneal free fluid. No intraperitoneal free gas. No organized fluid collection. Musculoskeletal: No abdominal wall hernia or abnormality. Diffuse subcutaneus soft tissue edema. No suspicious lytic or blastic osseous lesions. No acute displaced fracture. IMPRESSION: 1. Bilateral mild hydroureteronephrosis in the setting of a fully decompressed urinary bladder lumen. Associated mild urothelial thickening. This may reflect chronic changes of obstructive uropathy or reflux. Correlate with analysis for possible superimposed infection. 2. Cardiomegaly. 3. Bilateral trace, right greater than left, pleural effusions. 4. Subcutaneus soft tissue edema. 5.  Aortic Atherosclerosis (ICD10-I70.0). Electronically Signed   By:  Morgane  Naveau M.D.   On: 06/09/2024 22:43   CT Head Wo Contrast Result Date: 06/09/2024 CLINICAL DATA:  Mental status change EXAM: CT HEAD WITHOUT CONTRAST TECHNIQUE: Contiguous axial images were obtained from the base of the skull through the vertex without intravenous contrast. RADIATION DOSE REDUCTION: This exam was performed according to the departmental dose-optimization program which includes automated exposure control, adjustment of the mA and/or kV according to patient size and/or use of iterative reconstruction technique. COMPARISON:  CT head 11/18/2009 FINDINGS: Brain: No intracranial hemorrhage, mass effect, or evidence of acute infarct. No hydrocephalus. No extra-axial fluid collection. Vascular: No hyperdense vessel or unexpected calcification. Skull: No fracture or focal lesion. Sinuses/Orbits: No acute finding. Other: None. IMPRESSION: No acute intracranial abnormality. Electronically Signed   By: Norman Gatlin M.D.   On: 06/09/2024 22:28    Assessment and Plan  UTI Hypotension appears to be chronic as he is on midodrine , now improved with IV fluids.  Most recent blood pressure 95/70.  Other than leukocytosis, does not meet any other SIRS criteria at this time.  UA with moderate leukocytes, >50 WBCs, and many bacteria.  Continue ceftriaxone  and follow-up urine culture.  Trend WBC count.  AKI Likely prerenal azotemia in the setting of hypotension.  BUN 48, creatinine 4.0 (baseline 0.8-1.2).  CT showing bilateral mild hydroureteronephrosis in the setting of a fully decompressed urinary bladder lumen with associated mild urothelial thickening.  This may reflect chronic changes of obstructive uropathy or reflux.  Continue gentle IV fluid hydration and monitor volume status very closely given chronic HFrEF.  Monitor renal function and urine output.  Avoid nephrotoxic agents.  Hold home Entresto  and Lasix .  Also hold metoprolol  at this time to avoid hypotension.  Check urine sodium and  creatinine.  Acute metabolic encephalopathy Suspect this is due to problems listed above.  CT head showing no acute intracranial abnormality and he has no focal neurodeficit on exam.  Ammonia level normal.  Check TSH and B12 levels.  Mild hyponatremia Continue gentle IV fluid hydration with normal saline and monitor labs.  Chronic HFrEF/biventricular heart failure Last echo done in July 2024 showing EF <20%, grade 1 diastolic dysfunction, RV systolic function moderately reduced, and mild mitral regurgitation.  No signs of volume overload  on exam.  CT showing trace bilateral pleural effusions.  Continuing gentle IV fluid hydration at this time in the setting of hypotension/AKI.  Check BNP.  EKG abnormality EKG showing QTc 561 in the setting of chronic LBBB.  Potassium is within normal range.  Check magnesium  level.  Avoid QT prolonging drugs if possible.  Also EKG showing new LAFB but patient is not endorsing any anginal symptoms.  Doubt ACS but will check troponin.  Chronic hypotension Continue midodrine .  BPH Schizophrenia Unable to verify home medications with the patient due to his altered mental status.  Continue home meds after pharmacy med rec is done.  DVT prophylaxis: SQ Heparin  Code Status: Full code by default.  Patient does not have capacity for decision-making, no surrogate or prior directive available.  Per review of chart, he was listed as full code during previous hospitalizations. Family Communication: No family available at this time. Level of care: Step Down Unit Admission status: It is my clinical opinion that admission to INPATIENT is reasonable and necessary because of the expectation that this patient will require hospital care that crosses at least 2 midnights to treat this condition based on the medical complexity of the problems presented.  Given the aforementioned information, the predictability of an adverse outcome is felt to be significant.  Editha Ram  MD [facility]  If 7PM-7AM, please contact night-coverage [email or web address]  06/10/2024, 12:04 AM

## 2024-06-10 NOTE — Consult Note (Signed)
 NAME:  Jordan Kennedy, MRN:  [id number], DOB:  1956/09/24, LOS: 0 ADMISSION DATE:  06/09/2024, CONSULTATION DATE: 06/10/2024 REFERRING MD:  Dr Jillian, CHIEF COMPLAINT: Shock  History of Present Illness:  68 year old male with history of schizophrenia and intellectual debility, HFrEF/biventricular heart failure, prior SBO, BPH.  Hospitalized with obstructive nephropathy and acute renal failure due to bladder outlet obstruction with associated left hydroureteronephrosis requiring Foley catheter 7/25-7/29.  Also had an NG tube for his SBO during that hospitalization. Readmitted now with encephalopathy, abdominal pain and shock.  Associated leukocytosis and recurrent acute renal failure (SCr 4.0), urinalysis consistent with UTI.  Culture sent and pending.  Head CT reassuring, CT abdomen with bilateral mild hydroureteronephrosis with some associated mild urothelial thickening, trace right greater than left effusions.  Ceftriaxone  initiated.  06/10/2024 SBP 70s.  I/O- 978 cc total.  Pertinent  Medical History   Past Medical History:  Diagnosis Date   AKI (acute kidney injury) (HCC) 06/29/2018   Anemia    Anemia, iron deficiency 09/18/2015   Bowel obstruction (HCC)    BPH (benign prostatic hyperplasia)    Chronic constipation    Chronic systolic heart failure (HCC)    History of ileus    Ileus (HCC) 03/07/2017   Mental retardation    Partial bowel obstruction (HCC)    Schizophrenia (HCC)    Monarche every three months;    SIRS (systemic inflammatory response syndrome) (HCC) 06/29/2018   Small bowel obstruction (HCC) 06/29/2018    Significant Hospital Events: Including procedures, antibiotic start and stop dates in addition to other pertinent events   8/19 head CT >> normal 8/19 CT scan of the abdomen/pelvis > diffuse bronchial wall thickening, trace pleural effusions.  No hepatobiliary abnormality.  Bilateral mild hydroureteronephrosis with some urothelial thickening.  Bladder is  decompressed with Foley in place  Interim History / Subjective:    Objective    Blood pressure (S) (!) 70/40, pulse (!) 101, temperature 98.9 F (37.2 C), temperature source Oral, resp. rate (!) 23, height 6' (1.829 m), weight 83.6 kg, SpO2 97%.        Intake/Output Summary (Last 24 hours) at 06/10/2024 1052 Last data filed at 06/10/2024 9281 Gross per 24 hour  Intake 271.31 ml  Output 1250 ml  Net -978.69 ml   Filed Weights   06/10/24 0419  Weight: 83.6 kg    Examination: General: Well-developed gentleman laying in bed in no distress on room air HENT: Oropharynx clear, strong voice, no secretions, no stridor Lungs: Clear bilaterally without wheezing or crackles Cardiovascular: Distant, regular, no murmur Abdomen: Somewhat protuberant and tympanitic, mild tenderness no rebound, no guarding.  Positive bowel sounds Extremities: No edema Neuro: Awake, alert.  Does answer questions not fully oriented.  Some evidence of intellectual delay.  Follows commands with good strength GU: Foley catheter in place  Resolved problem list   Assessment and Plan   Shock, consistent with septic shock due to urinary tract infection organism not yet specified Chronic hypotension Possible component superimposed cardiogenic shock given history of LV dysfunction. -Ceftriaxone  as ordered, tailor to culture data as available -Foley catheter has been exchanged 8/20 - Gentle IV fluid resuscitation, careful given his LV dysfunction and diastolic dysfunction.  Plan to repeat bolus now - Continue his home midodrine  - Home blood pressure and diuretics regimen held as below - Peripheral norepinephrine  support  Acute renal failure, suspect ATN from hypoperfusion and septic shock Non-anion gap metabolic acidosis Mild hyponatremia - Following urine output, BMP with restoration of  adequate perfusion pressures - Gentle IV fluid resuscitation  Acute metabolic encephalopathy due to the above -Supportive  care, reverse metabolic disarray, treat underlying contributors especially UTI - Avoid any sedating medications  Chronic HFrEF (LVEF 04/2023 20%, grade 1 diastolic dysfunction, moderate RV dysfunction) Prolonged QTc/left bundle branch block -Temporarily hold Entresto , Toprol -XL, Farxiga  - On Lasix  as needed for swelling at home -Avoid any QT relevant medications  BPH with history of obstructive nephropathy, chronic Foley in place since 04/2024 hospitalization -Foley catheter in place - Flomax  ordered  Schizophrenia -Continue Home meds amantadine , clozapine , Paxil  -Hold Ambien  for now, melatonin okay  Best Practice (right click and Reselect all SmartList Selections daily)   Diet/type: Regular consistency (see orders) DVT prophylaxis prophylactic heparin   Pressure ulcer(s): N/A GI prophylaxis: N/A Lines: N/A Foley:  Yes, and it is still needed Code Status:  full code Last date of multidisciplinary goals of care discussion [pending]  Labs   CBC: Recent Labs  Lab 06/09/24 2101 06/10/24 0331  WBC 20.6* 21.1*  NEUTROABS 17.0*  --   HGB 11.0* 11.5*  HCT 34.5* 35.4*  MCV 94.3 93.7  PLT 342 350    Basic Metabolic Panel: Recent Labs  Lab 06/09/24 2101 06/10/24 0331  NA 134* 134*  K 4.1 4.2  CL 101 105  CO2 22 19*  GLUCOSE 150* 120*  BUN 48* 43*  CREATININE 4.07* 3.11*  CALCIUM  8.0* 8.0*  MG  --  1.9   GFR: Estimated Creatinine Clearance: 26 mL/min (A) (by C-G formula based on SCr of 3.11 mg/dL (H)). Recent Labs  Lab 06/09/24 2101 06/10/24 0331  WBC 20.6* 21.1*    Liver Function Tests: Recent Labs  Lab 06/09/24 2101  AST 34  ALT 23  ALKPHOS 110  BILITOT 0.5  PROT 6.2*  ALBUMIN 2.4*   Recent Labs  Lab 06/09/24 2101  LIPASE 24   Recent Labs  Lab 06/09/24 2101  AMMONIA 16    ABG    Component Value Date/Time   PHART 7.414 01/01/2023 1716   PCO2ART 36.9 01/01/2023 1716   PO2ART 58 (L) 01/01/2023 1716   HCO3 23.7 01/01/2023 1716   TCO2 20  (L) 05/15/2024 1302   ACIDBASEDEF 1.0 01/01/2023 1716   O2SAT 90 01/01/2023 1716     Coagulation Profile: No results for input(s): INR, PROTIME in the last 168 hours.  Cardiac Enzymes: No results for input(s): CKTOTAL, CKMB, CKMBINDEX, TROPONINI in the last 168 hours.  HbA1C: Hgb A1c MFr Bld  Date/Time Value Ref Range Status  09/14/2015 04:37 PM 5.9 (H) <5.7 % Final    Comment:                                                                           According to the ADA Clinical Practice Recommendations for 2011, when HbA1c is used as a screening test:     >=6.5%   Diagnostic of Diabetes Mellitus            (if abnormal result is confirmed)   5.7-6.4%   Increased risk of developing Diabetes Mellitus   References:Diagnosis and Classification of Diabetes Mellitus,Diabetes Care,2011,34(Suppl 1):S62-S69 and Standards of Medical Care in         Diabetes - 2011,Diabetes  Rjmz,7988,65 (Suppl 1):S11-S61.       CBG: No results for input(s): GLUCAP in the last 168 hours.  Review of Systems:     Past Medical History:  He,  has a past medical history of AKI (acute kidney injury) (HCC) (06/29/2018), Anemia, Anemia, iron deficiency (09/18/2015), Bowel obstruction (HCC), BPH (benign prostatic hyperplasia), Chronic constipation, Chronic systolic heart failure (HCC), History of ileus, Ileus (HCC) (03/07/2017), Mental retardation, Partial bowel obstruction (HCC), Schizophrenia (HCC), SIRS (systemic inflammatory response syndrome) (HCC) (06/29/2018), and Small bowel obstruction (HCC) (06/29/2018).   Surgical History:  History reviewed. No pertinent surgical history.   Social History:   reports that he has quit smoking. His smoking use included cigarettes. He has a 7.5 pack-year smoking history. He has never used smokeless tobacco. He reports that he does not drink alcohol and does not use drugs.   Family History:  His family history includes Cancer in his mother.    Allergies No Known Allergies   Home Medications  Prior to Admission medications   Medication Sig Start Date End Date Taking? Authorizing Provider  amantadine  (SYMMETREL ) 100 MG capsule Take 100 mg by mouth at bedtime.    Yes [provider]  aspirin  EC (ASPIRIN  EC ADULT LOW DOSE) 81 MG tablet TAKE 1 TABLET BY MOUTH EVERY DAY 02/07/24  Yes [name] HERO, MD  cloZAPine  (CLOZARIL ) 100 MG tablet Take 2 tablets (200 mg total) by mouth 2 (two) times daily. 03/12/17  Yes Sebastian Toribio GAILS, MD  dapagliflozin  propanediol (FARXIGA ) 10 MG TABS tablet TAKE 1 TABLET BY MOUTH EVERY DAY 02/07/24  Yes [name] HERO, MD  finasteride  (PROSCAR ) 5 MG tablet Take 1 tablet (5 mg total) by mouth daily. 06/02/24 05/28/25 Yes RuddGarnette HERO, MD  furosemide  (LASIX ) 40 MG tablet Take 1 tablet (40 mg total) by mouth as needed. Patient taking differently: Take 40 mg by mouth as needed for edema or fluid. 03/24/24  Yes [name] HERO, MD  LINZESS  290 MCG CAPS capsule TAKE 1 CAPSULE BY MOUTH EVERY MORNING BEFORE BREAKFAST Patient taking differently: Take 290 mcg by mouth daily before breakfast. 05/21/24  Yes [name] HERO, MD  Melatonin 10 MG SUBL Place 10 mg under the tongue at bedtime.   Yes [provider]  metoCLOPramide  (REGLAN ) 5 MG tablet TAKE 1 TABLET BY MOUTH 2 TIMES DAILY 05/29/24  Yes [name] HERO, MD  metoprolol  succinate (TOPROL -XL) 25 MG 24 hr tablet TAKE 1/2 TABLET BY MOUTH EVERY DAY 02/07/24  Yes [name] HERO, MD  midodrine  (PROAMATINE ) 5 MG tablet Take 1 tablet (5 mg total) by mouth 2 (two) times daily with a meal. 12/04/23  Yes Tobb, Kardie, DO  omeprazole  (PRILOSEC) 40 MG capsule TAKE 1 CAPSULE BY MOUTH EVERY EVENING 02/07/24  Yes [name] HERO, MD  PARoxetine  (PAXIL ) 40 MG tablet Take 40 mg by mouth every evening.    Yes [provider]  polyethylene glycol powder (GLYCOLAX /MIRALAX ) 17 GM/SCOOP powder Take 17 g by mouth See admin instructions. 17 g twice weekly on Monday  and Thursday. 05/14/24  Yes [name] HERO, MD  sacubitril -valsartan  (ENTRESTO ) 24-26 MG TAKE 1 TABLET BY MOUTH 2 TIMES DAILY 02/07/24  Yes [name] HERO, MD  SENNA PLUS 8.6-50 MG tablet TAKE 1 TABLET BY MOUTH 2 TIMES DAILY 03/19/24  Yes [name] HERO, MD  tamsulosin  (FLOMAX ) 0.4 MG CAPS capsule Take 0.4 mg by mouth daily after supper. 01/10/21  Yes [provider]  zolpidem  (AMBIEN ) 10 MG tablet  Take 1 tablet (10 mg total) by mouth at bedtime. 06/19/23  Yes [name] HERO, MD  MYRBETRIQ  50 MG TB24 tablet Take 50 mg by mouth daily. Patient not taking: Reported on 06/10/2024 01/06/21   [provider]     Critical care time: 45 min     Lamar Chris, MD, PhD 06/10/2024, 10:52 AM [facility] [phone] or if no answer before 7:00PM call [phone] For any issues after 7:00PM please call eLink [phone]

## 2024-06-10 NOTE — Plan of Care (Signed)
  Problem: Education: Goal: Knowledge of General Education information will improve Description: Including pain rating scale, medication(s)/side effects and non-pharmacologic comfort measures Outcome: Progressing   Problem: Health Behavior/Discharge Planning: Goal: Ability to manage health-related needs will improve Outcome: Progressing   Problem: Clinical Measurements: Goal: Ability to maintain clinical measurements within normal limits will improve Outcome: Progressing Goal: Will remain free from infection Outcome: Progressing Goal: Diagnostic test results will improve Outcome: Progressing Goal: Respiratory complications will improve Outcome: Progressing Goal: Cardiovascular complication will be avoided Outcome: Progressing   Problem: Activity: Goal: Risk for activity intolerance will decrease Outcome: Progressing   Problem: Nutrition: Goal: Adequate nutrition will be maintained Outcome: Progressing   Problem: Coping: Goal: Level of anxiety will decrease Outcome: Progressing   Problem: Elimination: Goal: Will not experience complications related to bowel motility Outcome: Progressing Goal: Will not experience complications related to urinary retention Outcome: Progressing   Problem: Pain Managment: Goal: General experience of comfort will improve and/or be controlled Outcome: Progressing   Problem: Safety: Goal: Ability to remain free from injury will improve Outcome: Progressing   Problem: Skin Integrity: Goal: Risk for impaired skin integrity will decrease Outcome: Progressing   Problem: Fluid Volume: Goal: Hemodynamic stability will improve Outcome: Progressing   Problem: Clinical Measurements: Goal: Signs and symptoms of infection will decrease Outcome: Progressing   Problem: Respiratory: Goal: Ability to maintain adequate ventilation will improve Outcome: Progressing

## 2024-06-11 DIAGNOSIS — R579 Shock, unspecified: Secondary | ICD-10-CM | POA: Diagnosis not present

## 2024-06-11 DIAGNOSIS — E8721 Acute metabolic acidosis: Secondary | ICD-10-CM | POA: Diagnosis not present

## 2024-06-11 DIAGNOSIS — N390 Urinary tract infection, site not specified: Secondary | ICD-10-CM | POA: Diagnosis not present

## 2024-06-11 DIAGNOSIS — N179 Acute kidney failure, unspecified: Secondary | ICD-10-CM | POA: Diagnosis not present

## 2024-06-11 LAB — CBC
HCT: 37 % — ABNORMAL LOW (ref 39.0–52.0)
Hemoglobin: 11.8 g/dL — ABNORMAL LOW (ref 13.0–17.0)
MCH: 30.2 pg (ref 26.0–34.0)
MCHC: 31.9 g/dL (ref 30.0–36.0)
MCV: 94.6 fL (ref 80.0–100.0)
Platelets: 352 K/uL (ref 150–400)
RBC: 3.91 MIL/uL — ABNORMAL LOW (ref 4.22–5.81)
RDW: 16.4 % — ABNORMAL HIGH (ref 11.5–15.5)
WBC: 20.4 K/uL — ABNORMAL HIGH (ref 4.0–10.5)
nRBC: 0 % (ref 0.0–0.2)

## 2024-06-11 LAB — COMPREHENSIVE METABOLIC PANEL WITH GFR
ALT: 43 U/L (ref 0–44)
AST: 62 U/L — ABNORMAL HIGH (ref 15–41)
Albumin: 2.3 g/dL — ABNORMAL LOW (ref 3.5–5.0)
Alkaline Phosphatase: 148 U/L — ABNORMAL HIGH (ref 38–126)
Anion gap: 12 (ref 5–15)
BUN: 32 mg/dL — ABNORMAL HIGH (ref 8–23)
CO2: 18 mmol/L — ABNORMAL LOW (ref 22–32)
Calcium: 8.1 mg/dL — ABNORMAL LOW (ref 8.9–10.3)
Chloride: 103 mmol/L (ref 98–111)
Creatinine, Ser: 1.72 mg/dL — ABNORMAL HIGH (ref 0.61–1.24)
GFR, Estimated: 44 mL/min — ABNORMAL LOW (ref >60)
Glucose, Bld: 128 mg/dL — ABNORMAL HIGH (ref 70–99)
Potassium: 4 mmol/L (ref 3.5–5.1)
Sodium: 133 mmol/L — ABNORMAL LOW (ref 135–145)
Total Bilirubin: 0.6 mg/dL (ref 0.0–1.2)
Total Protein: 6.3 g/dL — ABNORMAL LOW (ref 6.5–8.1)

## 2024-06-11 LAB — MAGNESIUM: Magnesium: 2.2 mg/dL (ref 1.7–2.4)

## 2024-06-11 LAB — PHOSPHORUS: Phosphorus: 3.5 mg/dL (ref 2.5–4.6)

## 2024-06-11 MED ORDER — MIDODRINE HCL 5 MG PO TABS
10.0000 mg | ORAL_TABLET | Freq: Three times a day (TID) | ORAL | Status: DC
  Administered 2024-06-12 – 2024-06-17 (×16): 10 mg via ORAL
  Filled 2024-06-11 (×16): qty 2

## 2024-06-11 NOTE — TOC Initial Note (Signed)
 Transition of Care [facility]) - Initial/Assessment Note    Patient Details  Name: Jordan Kennedy MRN: [id number] Date of Birth: 05-23-1956  [facility] CM/SW Contact:    Jon ONEIDA Anon, RN Phone Number: 06/11/2024, 11:04 AM  Clinical Narrative:                 Pt is from home, currently living with Legal Guardian Sherleen Ngo. NCM spoke with Sherleen over the phone at [phone], and he states pt has been living with him for the past 5 months and he cares for him in the home. Pt was set up with [facility] to receive HHPT/OT services. NCM spoke with Arna at [facility] and she confirms pt is receiving [facility] services and will need ROC orders at DC. Awaiting PT/OT eval. IP Care Management following for any DC needs.     Expected Discharge Plan: Home w Home Health Services Barriers to Discharge: Continued Medical Work up   Patient Goals and CMS Choice Patient states their goals for this hospitalization and ongoing recovery are:: Per pt Legal Guardian Sherleen, pt will return home with him once ready to discharge CMS Medicare.gov Compare Post Acute Care list provided to:: Legal Guardian Choice offered to / list presented to : [facility] POA / Guardian [place] ownership interest in [facility].provided to:: [facility] POA / Guardian    Expected Discharge Plan and Services In-house Referral: Clinical Social Work Discharge Planning Services: CM Consult Post Acute Care Choice: Home Health Living arrangements for the past 2 months: Apartment                 DME Arranged: N/A DME Agency: NA         HH Agency: [facility] Date HH Agency Contacted: 06/10/24 Time HH Agency Contacted: 1141 Representative spoke with at [facility] Agency: Arna Sayres  Prior Living Arrangements/Services Living arrangements for the past 2 months: Apartment Lives with:: Other (Comment) (Legal Guardian) Patient language and need for interpreter reviewed:: Yes Do you feel safe going back to the  place where you live?: Yes      Need for Family Participation in Patient Care: Yes (Comment) Care giver support system in place?: Yes (comment) Current home services: Home PT, Home OT, DME Criminal Activity/Legal Involvement Pertinent to Current Situation/Hospitalization: No - Comment as needed  Activities of Daily Living   ADL Screening (condition at time of admission) Independently performs ADLs?: No Does the patient have a NEW difficulty with bathing/dressing/toileting/self-feeding that is expected to last >3 days?: Yes (Initiates electronic notice to provider for possible OT consult) Does the patient have a NEW difficulty with getting in/out of bed, walking, or climbing stairs that is expected to last >3 days?: Yes (Initiates electronic notice to provider for possible PT consult) Does the patient have a NEW difficulty with communication that is expected to last >3 days?: No Is the patient deaf or have difficulty hearing?: No Does the patient have difficulty seeing, even when wearing glasses/contacts?: No Does the patient have difficulty concentrating, remembering, or making decisions?: Yes  Permission Sought/Granted Permission sought to share information with : Family Supports, Magazine features editor Permission granted to share information with : Yes, Verbal Permission Granted  Share Information with NAME: Ngo Sherleen (Legal Guardian)  [phone]  Permission granted to share info w AGENCY: [facility]        Emotional Assessment Appearance:: Other (Comment Required (Unable to assess) Attitude/Demeanor/Rapport: Unable to Assess Affect (typically observed): Unable to  Assess Orientation: : Oriented to Self, Oriented to Place Alcohol / Substance Use: Not Applicable Psych Involvement: No (comment)  Admission diagnosis:  Confusion [R41.0] UTI (urinary tract infection) [N39.0] Acute cystitis with hematuria [N30.01] AKI (acute kidney injury) (HCC)  [N17.9] Patient Active Problem List   Diagnosis Date Noted   UTI (urinary tract infection) 06/10/2024   Acute metabolic encephalopathy 06/10/2024   Hyponatremia 06/10/2024   Hydronephrosis due to obstruction of bladder 06/02/2024   Obstructive nephropathy due to benign prostatic hyperplasia 06/02/2024   Bladder outlet obstruction 05/15/2024   Enuresis 06/19/2023   Left bundle branch block 02/13/2023   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   Tinea pedis of both feet 01/11/2023   Chronic HFrEF (heart failure with reduced ejection fraction) (HCC) 01/03/2023   Prolonged QT interval 01/01/2023   Transaminitis 01/01/2023   Thrombocytosis 01/01/2023   Tardive dyskinesia 11/20/2021   BPH with obstruction/lower urinary tract symptoms 05/02/2021   History of small bowel obstruction 01/31/2021   History of colon polyps 01/31/2021   Onychomycosis 01/31/2021   Gastroesophageal reflux disease 01/31/2021   AKI (acute kidney injury) (HCC) 06/29/2018   Tobacco use disorder, mild, in sustained remission 02/01/2015   Constipation due to slow transit 04/25/2012   Anxiety and depression 04/25/2012   Edentulous 04/25/2012   Schizophrenia (HCC)    Intellectual disability    PCP:  [name] HERO, MD Pharmacy:   [facility] [address] [place] [address] Phone: [phone] Fax: [phone]  [facility] - [place], [place] - [address] [place] [address] Phone: [phone] Fax: [phone]  [facility] [address] [place] [address] Phone: [phone] Fax: [phone]     Social Drivers of Health (SDOH) Social History: SDOH Screenings   Food Insecurity: Patient Unable To Answer (06/10/2024)  Housing: Patient Unable To Answer (06/10/2024)  Transportation Needs: Patient Unable To Answer (06/10/2024)  Utilities: Patient Unable To Answer  (06/10/2024)  Alcohol Screen: Low Risk  (11/05/2023)  Depression (PHQ2-9): Low Risk  (11/05/2023)  Financial Resource Strain: Low Risk  (11/05/2023)  Physical Activity: Inactive (11/05/2023)  Social Connections: Patient Unable To Answer (06/10/2024)  Stress: No Stress Concern Present (11/05/2023)  Tobacco Use: Medium Risk (06/09/2024)  Health Literacy: Adequate Health Literacy (11/05/2023)   SDOH Interventions:     Readmission Risk Interventions    06/11/2024   10:27 AM  Readmission Risk Prevention Plan  Transportation Screening Complete  PCP or Specialist Appt within 3-5 Days Complete  HRI or Home Care Consult Complete  Social Work Consult for Recovery Care Planning/Counseling Complete  Palliative Care Screening Not Applicable  Medication Review Oceanographer) Complete

## 2024-06-11 NOTE — Progress Notes (Signed)
 NAME:  Jordan Kennedy, MRN:  [id number], DOB:  Apr 18, 1956, LOS: 1 ADMISSION DATE:  06/09/2024, CONSULTATION DATE: 06/10/2024 REFERRING MD:  Dr Jillian, CHIEF COMPLAINT: Shock  History of Present Illness:  68 year old male with history of schizophrenia and intellectual debility, HFrEF/biventricular heart failure, prior SBO, BPH.  Hospitalized with obstructive nephropathy and acute renal failure due to bladder outlet obstruction with associated left hydroureteronephrosis requiring Foley catheter 7/25-7/29.  Also had an NG tube for his SBO during that hospitalization. Readmitted now with encephalopathy, abdominal pain and shock.  Associated leukocytosis and recurrent acute renal failure (SCr 4.0), urinalysis consistent with UTI.  Culture sent and pending.  Head CT reassuring, CT abdomen with bilateral mild hydroureteronephrosis with some associated mild urothelial thickening, trace right greater than left effusions.  Ceftriaxone  initiated.  06/10/2024 SBP 70s.  I/O- 978 cc total.  Pertinent  Medical History   Past Medical History:  Diagnosis Date   AKI (acute kidney injury) (HCC) 06/29/2018   Anemia    Anemia, iron deficiency 09/18/2015   Bowel obstruction (HCC)    BPH (benign prostatic hyperplasia)    Chronic constipation    Chronic systolic heart failure (HCC)    History of ileus    Ileus (HCC) 03/07/2017   Mental retardation    Partial bowel obstruction (HCC)    Schizophrenia (HCC)    Monarche every three months;    SIRS (systemic inflammatory response syndrome) (HCC) 06/29/2018   Small bowel obstruction (HCC) 06/29/2018    Significant Hospital Events: Including procedures, antibiotic start and stop dates in addition to other pertinent events   8/19 head CT >> normal 8/19 CT scan of the abdomen/pelvis > diffuse bronchial wall thickening, trace pleural effusions.  No hepatobiliary abnormality.  Bilateral mild hydroureteronephrosis with some urothelial thickening.  Bladder is  decompressed with Foley in place  Interim History / Subjective:  - Has required norepinephrine , currently 10. - No evidence of hypoperfusion clinically - Room air - States that he is hungry - High urine output last 24 hours, suspected post ATN diuresis  - Improved renal function, SCr 3.11 >> 1.72 - CO2 19 > 18 - Persistent leukocytosis - Lactic acid reassuring  Objective    Blood pressure 100/74, pulse (!) 104, temperature 99.9 F (37.7 C), temperature source Oral, resp. rate 20, height 6' (1.829 m), weight 87.4 kg, SpO2 98%.        Intake/Output Summary (Last 24 hours) at 06/11/2024 0756 Last data filed at 06/11/2024 9347 Gross per 24 hour  Intake 3828.2 ml  Output 4370 ml  Net -541.8 ml   Filed Weights   06/10/24 0419 06/11/24 0543  Weight: 83.6 kg 87.4 kg    Examination: General: Up to chair, comfortable HENT: Oropharynx clear, strong voice without secretions or stridor Lungs: Decreased to both bases, no wheezes or crackles Cardiovascular: Distant, regular, no murmur Abdomen: Obese, nondistended, somewhat protuberant and tympanitic.  Nontender on palpation Extremities: No edema Neuro: Awake and alert, mumbles some answers.  Oriented to self, place.  Some impaired insight into his overall medical condition.  Moves all extremities with good strength GU: Foley catheter in place  Resolved problem list   Assessment and Plan   Shock, consistent with septic shock due to urinary tract infection organism not yet specified Chronic hypotension Possible component superimposed cardiogenic shock given history of LV dysfunction. - Urine culture still pending, following - Ceftriaxone  as ordered, tailor as cultures indicate - Foley catheter was exchanged on 8/20 - Continue gentle IV fluid resuscitation as  he can tolerate given his LV dysfunction and diastolic dysfunction.  He remains on room air - Midodrine  15 mg 3 times daily - Home blood pressure regimen and diuretics on  hold - Wean norepinephrine  as able  Acute renal failure, suspect ATN from hypoperfusion and septic shock Non-anion gap metabolic acidosis Mild hyponatremia -High urine output, likely post ATN diuresis.  Will need to work to keep up with his volume losses to manage his shock state.  Serum creatinine improved  Acute metabolic encephalopathy due to the above -Addressing metabolic abnormalities, ensuring adequate perfusion, treating underlying infection - Avoid any sedating medications  Chronic HFrEF (LVEF 04/2023 20%, grade 1 diastolic dysfunction, moderate RV dysfunction) Prolonged QTc/left bundle branch block - Continue to hold Entresto , Toprol -XL, Farxiga  - On Lasix  as needed for swelling at home (holding) - Continue to avoid any QT relevant medications  BPH with history of obstructive nephropathy, chronic Foley in place since 04/2024 hospitalization -Has a chronic Foley catheter since 04/2024 - Continue Flomax  - Question whether he would be a candidate for voiding trial and Foley removal once acute infection addressed  Schizophrenia - Continue Home meds amantadine , clozapine , Paxil  - Continue to hold Ambien  for now, melatonin okay  Best Practice (right click and Reselect all SmartList Selections daily)   Diet/type: Regular consistency (see orders) DVT prophylaxis prophylactic heparin   Pressure ulcer(s): N/A GI prophylaxis: N/A Lines: N/A Foley:  Yes, and it is still needed Code Status:  full code Last date of multidisciplinary goals of care discussion [pending]  Labs   CBC: Recent Labs  Lab 06/09/24 2101 06/10/24 0331 06/11/24 0302  WBC 20.6* 21.1* 20.4*  NEUTROABS 17.0*  --   --   HGB 11.0* 11.5* 11.8*  HCT 34.5* 35.4* 37.0*  MCV 94.3 93.7 94.6  PLT 342 350 352    Basic Metabolic Panel: Recent Labs  Lab 06/09/24 2101 06/10/24 0331 06/11/24 0302  NA 134* 134* 133*  K 4.1 4.2 4.0  CL 101 105 103  CO2 22 19* 18*  GLUCOSE 150* 120* 128*  BUN 48* 43* 32*   CREATININE 4.07* 3.11* 1.72*  CALCIUM  8.0* 8.0* 8.1*  MG  --  1.9 2.2  PHOS  --   --  3.5   GFR: Estimated Creatinine Clearance: 47 mL/min (A) (by C-G formula based on SCr of 1.72 mg/dL (H)). Recent Labs  Lab 06/09/24 2101 06/10/24 0331 06/11/24 0302  WBC 20.6* 21.1* 20.4*    Liver Function Tests: Recent Labs  Lab 06/09/24 2101 06/11/24 0302  AST 34 62*  ALT 23 43  ALKPHOS 110 148*  BILITOT 0.5 0.6  PROT 6.2* 6.3*  ALBUMIN 2.4* 2.3*   Recent Labs  Lab 06/09/24 2101  LIPASE 24   Recent Labs  Lab 06/09/24 2101  AMMONIA 16    ABG    Component Value Date/Time   PHART 7.414 01/01/2023 1716   PCO2ART 36.9 01/01/2023 1716   PO2ART 58 (L) 01/01/2023 1716   HCO3 23.7 01/01/2023 1716   TCO2 20 (L) 05/15/2024 1302   ACIDBASEDEF 1.0 01/01/2023 1716   O2SAT 90 01/01/2023 1716     Coagulation Profile: No results for input(s): INR, PROTIME in the last 168 hours.  Cardiac Enzymes: No results for input(s): CKTOTAL, CKMB, CKMBINDEX, TROPONINI in the last 168 hours.  HbA1C: Hgb A1c MFr Bld  Date/Time Value Ref Range Status  09/14/2015 04:37 PM 5.9 (H) <5.7 % Final    Comment:  According to the ADA Clinical Practice Recommendations for 2011, when HbA1c is used as a screening test:     >=6.5%   Diagnostic of Diabetes Mellitus            (if abnormal result is confirmed)   5.7-6.4%   Increased risk of developing Diabetes Mellitus   References:Diagnosis and Classification of Diabetes Mellitus,Diabetes Care,2011,34(Suppl 1):S62-S69 and Standards of Medical Care in         Diabetes - 2011,Diabetes Care,2011,34 (Suppl 1):S11-S61.       CBG: No results for input(s): GLUCAP in the last 168 hours.    Critical care time: 32 min     Lamar Chris, MD, PhD 06/11/2024, 7:56 AM [facility] [phone] or if no answer before 7:00PM call  [phone] For any issues after 7:00PM please call eLink [phone]

## 2024-06-11 NOTE — Evaluation (Signed)
 Physical Therapy Evaluation Patient Details Name: Jordan Kennedy MRN: [id number] DOB: 1956/02/27 Today's Date: 06/11/2024  History of Present Illness  Pt is a 68 y.o. male admitted8/20/25  with encephalopathy, abdominal pain and shock, Associated leukocytosis and recurrent acute renal failure (SCr 4.0), urinalysis consistent with UTI. admitted  05/15/24 with abdominal discomfort, hypotension,r AKI, bladder outlet obstruction, L hydroureteronephrosis, SBO. PMH includes schizophrenia, intellectual disability, tardive dyskinesia, chronic constipation, ileus, SBO, BPH.  Clinical Impression  Pt admitted with above diagnosis.  Pt currently with functional limitations due to the deficits listed below (see PT Problem List). Pt will benefit from acute skilled PT to increase their independence and safety with mobility to allow discharge.   Patient recently in [facility], per chart, DC'd to private home with a caregiver. No one present to confirm.  Patient very unsteady to stand and step to recliner, extremities sort of flailing at times, multimodal cues to hold onto RW.  Mod support for balance. Patient will benefit from continued inpatient follow up therapy, <3 hours/day unless patient  progresses  to PLOF and has caregiver support. BP 108/77  after pivot top recliner.. HR 111, spo2 100%      If plan is discharge home, recommend the following: A lot of help with walking and/or transfers;A lot of help with bathing/dressing/bathroom;Assistance with cooking/housework;Assist for transportation;Help with stairs or ramp for entrance   Can travel by private vehicle   No    Equipment Recommendations None recommended by PT  Recommendations for Other Services       Functional Status Assessment Patient has had a recent decline in their functional status and demonstrates the ability to make significant improvements in function in a reasonable and predictable amount of time.     Precautions / Restrictions  Precautions Precautions: Fall;Other (comment) Recall of Precautions/Restrictions: Impaired Restrictions Weight Bearing Restrictions Per Provider Order: No      Mobility  Bed Mobility Overal bed mobility: Needs Assistance Bed Mobility: Supine to Sit     Supine to sit: Min assist     General bed mobility comments: frequent cues to slow, impulsive, assist with lines and visual cues to  sit upright and place feet onto floor    Transfers Overall transfer level: Needs assistance Equipment used: Rolling walker (2 wheels) Transfers: Sit to/from Stand Sit to Stand: Min assist   Step pivot transfers: Mod assist       General transfer comment: impulsive, unsteady with stepping, uncontrolled  movements of arms and legs to step and hold RW    Ambulation/Gait                  Stairs            Wheelchair Mobility     Tilt Bed    Modified Rankin (Stroke Patients Only)       Balance Overall balance assessment: Needs assistance Sitting-balance support: No upper extremity supported, Feet supported Sitting balance-Leahy Scale: Fair     Standing balance support: Bilateral upper extremity supported, During functional activity Standing balance-Leahy Scale: Poor Standing balance comment: steady assist                             Pertinent Vitals/Pain Pain Assessment Faces Pain Scale: No hurt    Home Living Family/patient expects to be discharged to:: Private residence Living Arrangements: Non-relatives/Friends Available Help at Discharge: Personal care attendant Type of Home: House  Additional Comments: per last admission,pt recently moved out of group home and currently lives with caregiver; attends adult day program    Prior Function Prior Level of Function : Needs assist;Patient poor historian/Family not available             Mobility Comments: inconsistent report of RW use at baseline ADLs Comments: suspect assist for  majority of ADL/iADLs given baseline cognitive impairments     Extremity/Trunk Assessment   Upper Extremity Assessment Upper Extremity Assessment:  (noted  ballistic movements, moves fast, eating very fast, impulsive, uncontrolled, spilling food.    Lower Extremity Assessment Lower Extremity Assessment: Generalized weakness (ballistic movements, unsteady to stand)    Cervical / Trunk Assessment Cervical / Trunk Assessment: Normal  Communication   Communication Communication: Impaired Factors Affecting Communication: Reduced clarity of speech    Cognition Arousal: Alert Behavior During Therapy: Restless, Impulsive   PT - Cognitive impairments: History of cognitive impairments                       PT - Cognition Comments: h/o schizophrenia, cognitive impairment. pt perseverating on eating; multiple times requesting a cola Following commands: Impaired Following commands impaired: Follows one step commands inconsistently     Cueing Cueing Techniques: Verbal cues, Gestural cues     General Comments      Exercises     Assessment/Plan    PT Assessment Patient needs continued PT services  PT Problem List Decreased strength;Decreased activity tolerance;Decreased balance;Decreased mobility;Decreased cognition;Decreased knowledge of use of DME;Decreased safety awareness       PT Treatment Interventions DME instruction;Gait training;Stair training;Functional mobility training;Therapeutic activities;Therapeutic exercise;Balance training;Patient/family education    PT Goals (Current goals can be found in the Care Plan section)  Acute Rehab PT Goals Patient Stated Goal: I want 2 colas PT Goal Formulation: Patient unable to participate in goal setting Time For Goal Achievement: 06/26/24 Potential to Achieve Goals: Fair    Frequency Min 2X/week     Co-evaluation               AM-PAC PT 6 Clicks Mobility  Outcome Measure Help needed turning from your  back to your side while in a flat bed without using bedrails?: A Little Help needed moving from lying on your back to sitting on the side of a flat bed without using bedrails?: A Little Help needed moving to and from a bed to a chair (including a wheelchair)?: A Lot Help needed standing up from a chair using your arms (e.g., wheelchair or bedside chair)?: A Lot Help needed to walk in hospital room?: Total Help needed climbing 3-5 steps with a railing? : Total 6 Click Score: 12    End of Session Equipment Utilized During Treatment: Gait belt Activity Tolerance: Patient tolerated treatment well Patient left: in chair;with call bell/phone within reach;with chair alarm set Nurse Communication: Mobility status PT Visit Diagnosis: Other abnormalities of gait and mobility (R26.89);Muscle weakness (generalized) (M62.81)    Time: 9199-9164 PT Time Calculation (min) (ACUTE ONLY): 35 min   Charges:   PT Evaluation $PT Eval Low Complexity: 1 Low PT Treatments $Therapeutic Activity: 8-22 mins PT General Charges $$ ACUTE PT VISIT: 1 Visit       Darice Potters PT Acute Rehabilitation Services Office [phone]   Potters Darice Norris 06/11/2024, 11:08 AM

## 2024-06-12 ENCOUNTER — Inpatient Hospital Stay (HOSPITAL_COMMUNITY)

## 2024-06-12 DIAGNOSIS — N390 Urinary tract infection, site not specified: Secondary | ICD-10-CM | POA: Diagnosis not present

## 2024-06-12 LAB — BASIC METABOLIC PANEL WITH GFR
Anion gap: 11 (ref 5–15)
BUN: 21 mg/dL (ref 8–23)
CO2: 19 mmol/L — ABNORMAL LOW (ref 22–32)
Calcium: 7.9 mg/dL — ABNORMAL LOW (ref 8.9–10.3)
Chloride: 104 mmol/L (ref 98–111)
Creatinine, Ser: 1.24 mg/dL (ref 0.61–1.24)
GFR, Estimated: >60 mL/min (ref >60)
Glucose, Bld: 103 mg/dL — ABNORMAL HIGH (ref 70–99)
Potassium: 4.3 mmol/L (ref 3.5–5.1)
Sodium: 134 mmol/L — ABNORMAL LOW (ref 135–145)

## 2024-06-12 LAB — URINE CULTURE: Culture: >=100000 — AB

## 2024-06-12 LAB — CBC
HCT: 37 % — ABNORMAL LOW (ref 39.0–52.0)
Hemoglobin: 11.5 g/dL — ABNORMAL LOW (ref 13.0–17.0)
MCH: 29.6 pg (ref 26.0–34.0)
MCHC: 31.1 g/dL (ref 30.0–36.0)
MCV: 95.1 fL (ref 80.0–100.0)
Platelets: 343 K/uL (ref 150–400)
RBC: 3.89 MIL/uL — ABNORMAL LOW (ref 4.22–5.81)
RDW: 16.6 % — ABNORMAL HIGH (ref 11.5–15.5)
WBC: 14 K/uL — ABNORMAL HIGH (ref 4.0–10.5)
nRBC: 0 % (ref 0.0–0.2)

## 2024-06-12 LAB — MAGNESIUM: Magnesium: 2 mg/dL (ref 1.7–2.4)

## 2024-06-12 LAB — PHOSPHORUS: Phosphorus: 3.1 mg/dL (ref 2.5–4.6)

## 2024-06-12 MED ORDER — SODIUM BICARBONATE 650 MG PO TABS
650.0000 mg | ORAL_TABLET | Freq: Three times a day (TID) | ORAL | Status: AC
  Administered 2024-06-12 (×3): 650 mg via ORAL
  Filled 2024-06-12 (×3): qty 1

## 2024-06-12 MED ORDER — SULFAMETHOXAZOLE-TRIMETHOPRIM 800-160 MG PO TABS
1.0000 | ORAL_TABLET | Freq: Two times a day (BID) | ORAL | Status: AC
  Administered 2024-06-12 – 2024-06-16 (×10): 1 via ORAL
  Filled 2024-06-12 (×10): qty 1

## 2024-06-12 NOTE — NC FL2 (Addendum)
 [place]  MEDICAID FL2 LEVEL OF CARE FORM     IDENTIFICATION  Patient Name: Jordan Kennedy Birthdate: [birth year] Sex: male Admission Date (Current Location): 06/09/2024  [facility] and [place] Number:  Producer, television/film/video and Address:  [facility],  [address] [place], [place] [address]      Provider Number: [id number]  Attending Physician Name and Address:  Von Bellis, MD  Relative Name and Phone Number:  Cindra Jung (Legal Guardian)  [phone]    Current Level of Care: Hospital Recommended Level of Care: Skilled Nursing Facility Prior Approval Number:    Date Approved/Denied:   PASRR Number: [id number]  Discharge Plan: SNF    Current Diagnoses: Patient Active Problem List   Diagnosis Date Noted   UTI (urinary tract infection) 06/10/2024   Acute metabolic encephalopathy 06/10/2024   Hyponatremia 06/10/2024   Hydronephrosis due to obstruction of bladder 06/02/2024   Obstructive nephropathy due to benign prostatic hyperplasia 06/02/2024   Bladder outlet obstruction 05/15/2024   Enuresis 06/19/2023   Left bundle branch block 02/13/2023   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   Tinea pedis of both feet 01/11/2023   Chronic HFrEF (heart failure with reduced ejection fraction) (HCC) 01/03/2023   Prolonged QT interval 01/01/2023   Transaminitis 01/01/2023   Thrombocytosis 01/01/2023   Tardive dyskinesia 11/20/2021   BPH with obstruction/lower urinary tract symptoms 05/02/2021   History of small bowel obstruction 01/31/2021   History of colon polyps 01/31/2021   Onychomycosis 01/31/2021   Gastroesophageal reflux disease 01/31/2021   AKI (acute kidney injury) (HCC) 06/29/2018   Tobacco use disorder, mild, in sustained remission 02/01/2015   Constipation due to slow transit 04/25/2012   Anxiety and depression 04/25/2012   Edentulous 04/25/2012   Schizophrenia (HCC)    Intellectual disability     Orientation  RESPIRATION BLADDER Height & Weight     Self, Place  Normal Indwelling catheter Weight: 192 lb 10.9 oz (87.4 kg) Height:  6' (182.9 cm)  BEHAVIORAL SYMPTOMS/MOOD NEUROLOGICAL BOWEL NUTRITION STATUS      Incontinent Diet (Heart Healthy)  AMBULATORY STATUS COMMUNICATION OF NEEDS Skin   Extensive Assist Verbally Normal                       Personal Care Assistance Level of Assistance  Bathing, Feeding, Dressing Bathing Assistance: Limited assistance Feeding assistance: Limited assistance Dressing Assistance: Limited assistance     Functional Limitations Info  Sight, Hearing, Speech Sight Info: Adequate Hearing Info: Adequate Speech Info: Adequate    SPECIAL CARE FACTORS FREQUENCY  PT (By licensed PT), OT (By licensed OT)     PT Frequency: 5x/wk OT Frequency: 5x/wk            Contractures Contractures Info: Not present    Additional Factors Info  Code Status, Allergies Code Status Info: FULL Allergies Info: No Known Allergies           Current Medications (06/12/2024):  This is the current hospital active medication list Current Facility-Administered Medications  Medication Dose Route Frequency Provider Last Rate Last Admin   acetaminophen  (TYLENOL ) tablet 650 mg  650 mg Oral Q6H PRN Rathore, Vasundhra, MD   650 mg at 06/11/24 2017   Or   acetaminophen  (TYLENOL ) suppository 650 mg  650 mg Rectal Q6H PRN Alfornia Madison, MD       amantadine  (SYMMETREL ) capsule 100 mg  100 mg Oral QHS Jillian Buttery, MD   100 mg at  06/11/24 2245   bisacodyl  (DULCOLAX) suppository 10 mg  10 mg Rectal Daily PRN Simpson, Paula B, NP       Chlorhexidine  Gluconate Cloth 2 % PADS 6 each  6 each Topical Daily Alfornia Madison, MD   6 each at 06/12/24 1003   cloZAPine  (CLOZARIL ) tablet 200 mg  200 mg Oral BID Jillian Buttery, MD   200 mg at 06/12/24 1002   cyanocobalamin  (VITAMIN B12) tablet 1,000 mcg  1,000 mcg Oral Daily Jillian Buttery, MD   1,000 mcg at 06/12/24 1003    heparin  injection 5,000 Units  5,000 Units Subcutaneous Q8H Rathore, Vasundhra, MD   5,000 Units at 06/12/24 1305   hydrocortisone  (ANUSOL -HC) 2.5 % rectal cream 1 Application  1 Application Topical QID PRN Rathore, Vasundhra, MD       linaclotide  (LINZESS ) capsule 145 mcg  145 mcg Oral QAC breakfast Jillian Buttery, MD   145 mcg at 06/12/24 0740   melatonin tablet 10 mg  10 mg Oral QHS Jillian Buttery, MD   10 mg at 06/11/24 2245   midodrine  (PROAMATINE ) tablet 10 mg  10 mg Oral TID WC Simpson, Paula B, NP   10 mg at 06/12/24 1304   mupirocin  ointment (BACTROBAN ) 2 % 1 Application  1 Application Nasal BID Rathore, Vasundhra, MD   1 Application at 06/12/24 1003   Oral care mouth rinse  15 mL Mouth Rinse PRN Rathore, Vasundhra, MD       PARoxetine  (PAXIL ) tablet 40 mg  40 mg Oral QPM Adhikari, Amrit, MD   40 mg at 06/11/24 1809   polyethylene glycol (MIRALAX  / GLYCOLAX ) packet 17 g  17 g Oral Daily Jillian Buttery, MD   17 g at 06/12/24 1003   senna-docusate (Senokot-S) tablet 1 tablet  1 tablet Oral BID Adhikari, Amrit, MD   1 tablet at 06/12/24 1003   sodium bicarbonate  tablet 650 mg  650 mg Oral TID Von Bellis, MD   650 mg at 06/12/24 1003   sulfamethoxazole -trimethoprim  (BACTRIM  DS) 800-160 MG per tablet 1 tablet  1 tablet Oral Q12H Von Bellis, MD   1 tablet at 06/12/24 1003   tamsulosin  (FLOMAX ) capsule 0.4 mg  0.4 mg Oral QPC supper Jillian Buttery, MD   0.4 mg at 06/10/24 1705     Discharge Medications: Please see discharge summary for a list of discharge medications.  Relevant Imaging Results:  Relevant Lab Results:   Additional Information SSN: [id number]  NORMAN ASPEN, LCSW

## 2024-06-12 NOTE — TOC PASRR Note (Signed)
 30 Day PASRR Note   Patient Details  Name: Jordan Kennedy Date of Birth: 03/03/1956   [facility]) CM/SW Contact:    Jon ONEIDA Anon, RN Phone Number: 06/12/2024, 11:11 AM  To Whom It May Concern:  Please be advised that this patient will require a short-term nursing home stay - anticipated 30 days or less for rehabilitation and strengthening.   The plan is for return home.

## 2024-06-12 NOTE — TOC Progression Note (Signed)
 Transition of Care [facility]) - Progression Note    Patient Details  Name: Jordan Kennedy MRN: [id number] Date of Birth: 13-Jun-1956  Transition of Care [facility]) CM/SW Contact  Jon ONEIDA Anon, RN Phone Number: 06/12/2024, 11:12 AM  Clinical Narrative:    PT evaluated pt and have recommended SNF. NCM tried reaching out to Legal Guardian Kor[place] to discuss SNF placement by phone with no answer, VM left. Needs FL2 and 30 Day Note cosigned by MD to send in for Level II PASRR. Will send out SNF referrals. IP Care Management continuing to follow.   Expected Discharge Plan: Home w Home Health Services Barriers to Discharge: Continued Medical Work up               Expected Discharge Plan and Services In-house Referral: Clinical Social Work Discharge Planning Services: CM Consult Post Acute Care Choice: Home Health Living arrangements for the past 2 months: Apartment                 DME Arranged: N/A DME Agency: NA         HH Agency: [facility] Date HH Agency Contacted: 06/10/24 Time HH Agency Contacted: 1141 Representative spoke with at [facility] Agency: Arna Sayres   Social Drivers of Health (SDOH) Interventions SDOH Screenings   Food Insecurity: Patient Unable To Answer (06/10/2024)  Housing: Patient Unable To Answer (06/10/2024)  Transportation Needs: Patient Unable To Answer (06/10/2024)  Utilities: Patient Unable To Answer (06/10/2024)  Alcohol Screen: Low Risk  (11/05/2023)  Depression (PHQ2-9): Low Risk  (11/05/2023)  Financial Resource Strain: Low Risk  (11/05/2023)  Physical Activity: Inactive (11/05/2023)  Social Connections: Patient Unable To Answer (06/10/2024)  Stress: No Stress Concern Present (11/05/2023)  Tobacco Use: Medium Risk (06/09/2024)  Health Literacy: Adequate Health Literacy (11/05/2023)    Readmission Risk Interventions    06/11/2024   10:27 AM  Readmission Risk Prevention Plan  Transportation Screening Complete  PCP or Specialist Appt within 3-5 Days  Complete  HRI or Home Care Consult Complete  Social Work Consult for Recovery Care Planning/Counseling Complete  Palliative Care Screening Not Applicable  Medication Review Oceanographer) Complete

## 2024-06-12 NOTE — Progress Notes (Signed)
 [facility] Progress Note  Patient: Jordan Kennedy    FMW:[id number]  DOA: 06/09/2024     Date of Service: the patient was seen and examined on 06/12/2024  Chief Complaint  Patient presents with   Altered Mental Status   Brief hospital course: 68 year old male with history of schizophrenia and intellectual debility, HFrEF/biventricular heart failure, prior SBO, BPH.  Hospitalized with obstructive nephropathy and acute renal failure due to bladder outlet obstruction with associated left hydroureteronephrosis requiring Foley catheter 7/25-7/29.  Also had an NG tube for his SBO during that hospitalization. Readmitted now with encephalopathy, abdominal pain and shock.  Associated leukocytosis and recurrent acute renal failure (SCr 4.0), urinalysis consistent with UTI.  Culture sent and pending.  Head CT reassuring, CT abdomen with bilateral mild hydroureteronephrosis with some associated mild urothelial thickening, trace right greater than left effusions.  Ceftriaxone  initiated.  06/10/2024 SBP 70s.  I/O- 978 cc total.    Assessment and Plan:  # Septic shock due to UTI w/AKI  # Chronic hypotension Possible component superimposed cardiogenic shock given history of LV dysfunction. S/p ceftriaxone  given during ICU stay. - Foley catheter was exchanged on 8/20 S/p IVF, discontinued due to CHF He remains on room air - Continue midodrine  15 mg 3 times daily - Home blood pressure regimen and diuretics on hold - Weaned off norepinephrine   Urine culture growing E. coli and Enterobacter cloaca Started Bactrim  twice daily for 5 to 7 days    Acute renal failure, suspect ATN from hypoperfusion and septic shock Non-anion gap metabolic acidosis Mild hyponatremia -High urine output, likely post ATN diuresis.  Serum creatinine improved 8/21 sCr 1.24, AKI resolved Bicarb 19, started oral bicarbonate.  Acute metabolic encephalopathy due to the above -Addressing metabolic abnormalities, ensuring  adequate perfusion, treating underlying infection - Avoid any sedating medications   Chronic HFrEF (LVEF 04/2023 20%, grade 1 diastolic dysfunction, moderate RV dysfunction) Prolonged QTc/left bundle branch block - Continue to hold Entresto , Toprol -XL, Farxiga  - On Lasix  as needed for swelling at home (holding) - Continue to avoid any QT relevant medications Resume diuretics once blood pressure is stable  Fluid restriction 1.5 L/day Monitor intake and output, renal functions daily Follow repeat EKG tomorrow a.m. 8/22   BPH with history of obstructive nephropathy, chronic Foley in place since 04/2024 hospitalization -Has a chronic Foley catheter since 04/2024 - Continue Flomax  - Question whether he would be a candidate for voiding trial and Foley removal once acute infection addressed   Schizophrenia - Continue Home meds amantadine , clozapine , Paxil  - Continue to hold Ambien  for now, melatonin okay  Vitamin B12 level 286, goal >400.  Started oral supplement.   Body mass index is 26.13 kg/m.  Interventions:  Diet: Heart healthy DVT Prophylaxis: Subcutaneous Heparin     Advance goals of care discussion: Full code  Family Communication: family was not present at bedside, at the time of interview.  The pt provided permission to discuss medical plan with the family. Opportunity was given to ask question and all questions were answered satisfactorily.  8/21 need to discuss with family, do not know how much patient can understand  Disposition:  Pt is from group home, admitted with sepsis due to UTI, still has soft blood pressure, which precludes a safe discharge. Discharge to group home, when stable, may need few more days to improve.  Subjective: No significant events overnight.  Patient was downgraded from ICU under [facility] service today. Patient was sitting on the recliner comfortably, denied any active issues at except  he was thirsty and drinking too much of the water. Patient was  placed on fluid restriction. Patient is AO x 2  Physical Exam: General: NAD, lying comfortably Appear in no distress, affect appropriate Eyes: PERRLA ENT: Oral Mucosa Clear, moist  Neck: no JVD,  Cardiovascular: S1 and S2 Present, no Murmur, tachycardia Respiratory: Equal air entry bilaterally, mild bibasilar crackles, no wheezes  Abdomen: Bowel Sound present, Soft and no tenderness,  Skin: no rashes Extremities: no Pedal edema, no calf tenderness Neurologic: without any new focal findings Gait not checked due to patient safety concerns  Vitals:   06/12/24 1300 06/12/24 1330 06/12/24 1400 06/12/24 1405  BP: (!) 93/59 98/68 103/78   Pulse: (!) 114 (!) 116 (!) 117 (!) 113  Resp: (!) 27 17 (!) 32 (!) 22  Temp:      TempSrc:      SpO2: 99% 97% 100% 91%  Weight:      Height:        Intake/Output Summary (Last 24 hours) at 06/12/2024 1412 Last data filed at 06/12/2024 1300 Gross per 24 hour  Intake 4589.17 ml  Output 6600 ml  Net -2010.83 ml   Filed Weights   06/10/24 0419 06/11/24 0543  Weight: 83.6 kg 87.4 kg    Data Reviewed: I have personally reviewed and interpreted daily labs, tele strips, imagings as discussed above. I reviewed all nursing notes, pharmacy notes, vitals, pertinent old records I have discussed plan of care as described above with RN and patient/family.  CBC: Recent Labs  Lab 06/09/24 2101 06/10/24 0331 06/11/24 0302 06/12/24 0303  WBC 20.6* 21.1* 20.4* 14.0*  NEUTROABS 17.0*  --   --   --   HGB 11.0* 11.5* 11.8* 11.5*  HCT 34.5* 35.4* 37.0* 37.0*  MCV 94.3 93.7 94.6 95.1  PLT 342 350 352 343   Basic Metabolic Panel: Recent Labs  Lab 06/09/24 2101 06/10/24 0331 06/11/24 0302 06/12/24 0303  NA 134* 134* 133* 134*  K 4.1 4.2 4.0 4.3  CL 101 105 103 104  CO2 22 19* 18* 19*  GLUCOSE 150* 120* 128* 103*  BUN 48* 43* 32* 21  CREATININE 4.07* 3.11* 1.72* 1.24  CALCIUM  8.0* 8.0* 8.1* 7.9*  MG  --  1.9 2.2 2.0  PHOS  --   --  3.5 3.1     Studies: DG CHEST PORT 1 VIEW Result Date: 06/12/2024 CLINICAL DATA:  Shortness of breath.  Urinary tract infection. EXAM: PORTABLE CHEST 1 VIEW COMPARISON:  05/15/2024 FINDINGS: Chronic cardiomegaly. Improvement in previously seen interstitial and alveolar edema. Lungs are clear today except for minimal volume loss at the left base. IMPRESSION: Improvement in previously seen interstitial and alveolar edema. Minimal volume loss at the left base. Electronically Signed   By: Oneil Officer M.D.   On: 06/12/2024 09:26    Scheduled Meds:  amantadine   100 mg Oral QHS   Chlorhexidine  Gluconate Cloth  6 each Topical Daily   cloZAPine   200 mg Oral BID   vitamin B-12  1,000 mcg Oral Daily   heparin   5,000 Units Subcutaneous Q8H   linaclotide   145 mcg Oral QAC breakfast   melatonin  10 mg Oral QHS   midodrine   10 mg Oral TID WC   mupirocin  ointment  1 Application Nasal BID   PARoxetine   40 mg Oral QPM   polyethylene glycol  17 g Oral Daily   senna-docusate  1 tablet Oral BID   sodium bicarbonate   650 mg Oral TID  sulfamethoxazole -trimethoprim   1 tablet Oral Q12H   tamsulosin   0.4 mg Oral QPC supper   Continuous Infusions:   PRN Meds: acetaminophen  **OR** acetaminophen , bisacodyl , hydrocortisone , mouth rinse  Time spent: 35 minutes  Author: ELVAN SOR. MD [facility] 06/12/2024 2:12 PM  To reach On-call, see care teams to locate the attending and reach out to them via [email or web address]. If 7PM-7AM, please contact night-coverage If you still have difficulty reaching the attending provider, please page the [facility] (Director on Call) for [facility] on amion for assistance.

## 2024-06-12 NOTE — Plan of Care (Signed)
  Problem: Nutrition: Goal: Adequate nutrition will be maintained Outcome: Progressing   Problem: Coping: Goal: Level of anxiety will decrease Outcome: Progressing   Problem: Pain Managment: Goal: General experience of comfort will improve and/or be controlled Outcome: Progressing   Problem: Safety: Goal: Ability to remain free from injury will improve Outcome: Progressing   Problem: Skin Integrity: Goal: Risk for impaired skin integrity will decrease Outcome: Progressing  1500 cc fluid restriction placed this afternoon. Discussed with patient that goal has been met already and that he is only allowed to have one drink with dinner for remaining shift.

## 2024-06-13 DIAGNOSIS — N390 Urinary tract infection, site not specified: Secondary | ICD-10-CM | POA: Diagnosis not present

## 2024-06-13 LAB — BRAIN NATRIURETIC PEPTIDE: B Natriuretic Peptide: 903 pg/mL — ABNORMAL HIGH (ref 0.0–100.0)

## 2024-06-13 LAB — CBC
HCT: 38.3 % — ABNORMAL LOW (ref 39.0–52.0)
Hemoglobin: 11.8 g/dL — ABNORMAL LOW (ref 13.0–17.0)
MCH: 29.6 pg (ref 26.0–34.0)
MCHC: 30.8 g/dL (ref 30.0–36.0)
MCV: 96.2 fL (ref 80.0–100.0)
Platelets: 304 K/uL (ref 150–400)
RBC: 3.98 MIL/uL — ABNORMAL LOW (ref 4.22–5.81)
RDW: 16.6 % — ABNORMAL HIGH (ref 11.5–15.5)
WBC: 17.9 K/uL — ABNORMAL HIGH (ref 4.0–10.5)
nRBC: 0 % (ref 0.0–0.2)

## 2024-06-13 LAB — BASIC METABOLIC PANEL WITH GFR
Anion gap: 7 (ref 5–15)
BUN: 18 mg/dL (ref 8–23)
CO2: 24 mmol/L (ref 22–32)
Calcium: 8.2 mg/dL — ABNORMAL LOW (ref 8.9–10.3)
Chloride: 107 mmol/L (ref 98–111)
Creatinine, Ser: 1.16 mg/dL (ref 0.61–1.24)
GFR, Estimated: >60 mL/min (ref >60)
Glucose, Bld: 103 mg/dL — ABNORMAL HIGH (ref 70–99)
Potassium: 4.4 mmol/L (ref 3.5–5.1)
Sodium: 138 mmol/L (ref 135–145)

## 2024-06-13 LAB — PHOSPHORUS: Phosphorus: 3.1 mg/dL (ref 2.5–4.6)

## 2024-06-13 LAB — MAGNESIUM: Magnesium: 1.9 mg/dL (ref 1.7–2.4)

## 2024-06-13 MED ORDER — LOPERAMIDE HCL 2 MG PO CAPS
2.0000 mg | ORAL_CAPSULE | Freq: Four times a day (QID) | ORAL | Status: DC | PRN
  Administered 2024-06-13: 2 mg via ORAL
  Filled 2024-06-13: qty 1

## 2024-06-13 NOTE — Plan of Care (Signed)
  Problem: Nutrition: Goal: Adequate nutrition will be maintained Outcome: Progressing   Problem: Coping: Goal: Level of anxiety will decrease Outcome: Progressing   Problem: Elimination: Goal: Will not experience complications related to urinary retention Outcome: Progressing   Problem: Pain Managment: Goal: General experience of comfort will improve and/or be controlled Outcome: Progressing   Problem: Safety: Goal: Ability to remain free from injury will improve Outcome: Progressing   Problem: Skin Integrity: Goal: Risk for impaired skin integrity will decrease Outcome: Progressing

## 2024-06-13 NOTE — Progress Notes (Signed)
 PROGRESS NOTE  Jordan Kennedy  FMW:[id number] DOB: February 06, 1956 DOA: 06/09/2024 PCP: [name] HERO, MD   Brief Narrative: Patient is a 68 year old male with history of schizophrenia, intellectual disability, HFrEF/biventricular heart failure, SBO, BPH who was recently admitted for acute renal failure secondary to bladder outlet obstruction and left hydroureteronephrosis requiring Foley catheter presented with complaint of abdominal pain, worsening confusion, low blood pressure.  On presentation, he was hypotensive with systolic blood pressure in the range of 80s.  Lab work showed WC count of 20.6, creatinine of 4.  UA suggestive of UTI.  CT head did not show any acute intracranial abnormalities.  CT abdomen/pelvis showed bilateral mild hydroureteronephrosis in the setting of fully decompressed urinary bladder lumen with associated mild urothelial thickening.  He was febrile on presentation.  Admitted for further management of septic shock likely from UTI and also AKI.  Rebound hypertensive with systolic blood pressure in the range of 70s critical care consulted and transferred to ICU on 8/20, started on pressors.  Currently off pressors, blood pressure improving.  Mental status is baseline.  PT recommending SNF on discharge.  Plan for discharge in next 1 to 2 days  Assessment & Plan:  Principal Problem:   UTI (urinary tract infection) Active Problems:   AKI (acute kidney injury) (HCC)   Chronic HFrEF (heart failure with reduced ejection fraction) (HCC)   Acute metabolic encephalopathy   Hyponatremia   Septic shock secondary to UTI: Presented with fever, tachycardia, leukocytosis. UA suspicious for UTI.  Started on broad spectrum antibiotics.  Continue midodrine  at current dose.  Currently obstructive blood pressure in the range of 90s and patient is asymptomatic.  Off pressures.   Urine culture showed E. coli and Enterobacter cloacae.  Currently on Bactrim .  AKI: Likely from prerenal etiology,  sepsis, hypotension.  Baseline creatinine ranges from 0.8-1.2.  Presented with creatinine in the range of 4. CT showing bilateral mild hydroureteronephrosis in the setting of a fully decompressed urinary bladder lumen with associated mild urothelial thickening.  Kidney function back to baseline  Acute metabolic encephalopathy: Most likely from sepsis.  CT head did not show any acute findings.  Ammonia level normal.  He is mostly alert and oriented this morning   Chronic HFrEF/biventricular heart failure: Echo done on July 2 and 24 showed EF of less than 20%, grade 1 diastolic function, moderately reduced right ventricular function.  Appeared dehydrated on presentation.  Given fluids, now is stopped.  Will resume his home CHF medications when his blood pressure allows   prolonged QTc: QTc of 561 on presentation.  Monitor on telemetry. Improved to 551   History of chronic hypotension: Continue midodrine   History of bladder outlet obstruction: During his last hospitalization, Foley was placed.  We can give a voiding trial.  If he retains, may need to place the Foley back.  Needs to follow-up with urology as an outpatient.  Continue Flomax    BPH/schizophrenia: Home medications restarted   Debility/deconditioning: Patient from group home.  PT recommending SNF on discharge.  TOC consulted          DVT prophylaxis:heparin  injection 5,000 Units Start: 06/10/24 0600     Code Status: Full Code  Family Communication: None at bedside  Patient status:Inpatient  Patient is from :Group home  Anticipated discharge to:SNF  Estimated DC date:1-2 days   Consultants: PCCM  Procedures:None  Antimicrobials:  Anti-infectives (From admission, onward)    Start     Dose/Rate Route Frequency Ordered Stop   06/12/24 1015  sulfamethoxazole -trimethoprim  (BACTRIM  DS) 800-160 MG per tablet 1 tablet        1 tablet Oral Every 12 hours 06/12/24 0915     06/10/24 2300  cefTRIAXone  (ROCEPHIN ) 1 g in  sodium chloride  0.9 % 100 mL IVPB  Status:  Discontinued        1 g 200 mL/hr over 30 Minutes Intravenous Every 24 hours 06/10/24 0034 06/10/24 0815   06/10/24 2300  cefTRIAXone  (ROCEPHIN ) 2 g in sodium chloride  0.9 % 100 mL IVPB  Status:  Discontinued        2 g 200 mL/hr over 30 Minutes Intravenous Every 24 hours 06/10/24 0815 06/12/24 0902   06/09/24 2300  cefTRIAXone  (ROCEPHIN ) 1 g in sodium chloride  0.9 % 100 mL IVPB        1 g 200 mL/hr over 30 Minutes Intravenous  Once 06/09/24 2259 06/09/24 2335       Subjective: Patient seen and examined at bedside today.  He was hemodynamically stable.  Systolic blood pressure in 90s.  He is very comfortable, sitting on the bed.  Mostly alert and oriented.  He says he wants a cup of orange juice.  Denies any abdomen pain, nausea or vomiting.  On room air.  Objective: Vitals:   06/13/24 0600 06/13/24 0657 06/13/24 0728 06/13/24 0800  BP:   (!) 94/55 93/66  Pulse: (!) 105 98 99 (!) 101  Resp: (!) 23 18 17  (!) 23  Temp:    98.2 F (36.8 C)  TempSrc:    Oral  SpO2: 95% 97% 99% 99%  Weight:      Height:        Intake/Output Summary (Last 24 hours) at 06/13/2024 1008 Last data filed at 06/13/2024 0900 Gross per 24 hour  Intake 2530 ml  Output 4900 ml  Net -2370 ml   Filed Weights   06/10/24 0419 06/11/24 0543 06/13/24 0341  Weight: 83.6 kg 87.4 kg 82.5 kg    Examination:  General exam: Overall comfortable, not in distress HEENT: PERRL Respiratory system:  no wheezes or crackles  Cardiovascular system: S1 & S2 heard, RRR.  Gastrointestinal system: Abdomen is nondistended, soft and nontender. Central nervous system: Alert and awake, mostly oriented Extremities: No edema, no clubbing ,no cyanosis Skin: No rashes, no ulcers,no icterus   GU: Foley   Data Reviewed: I have personally reviewed following labs and imaging studies  CBC: Recent Labs  Lab 06/09/24 2101 06/10/24 0331 06/11/24 0302 06/12/24 0303 06/13/24 0243  WBC  20.6* 21.1* 20.4* 14.0* 17.9*  NEUTROABS 17.0*  --   --   --   --   HGB 11.0* 11.5* 11.8* 11.5* 11.8*  HCT 34.5* 35.4* 37.0* 37.0* 38.3*  MCV 94.3 93.7 94.6 95.1 96.2  PLT 342 350 352 343 304   Basic Metabolic Panel: Recent Labs  Lab 06/09/24 2101 06/10/24 0331 06/11/24 0302 06/12/24 0303 06/13/24 0243  NA 134* 134* 133* 134* 138  K 4.1 4.2 4.0 4.3 4.4  CL 101 105 103 104 107  CO2 22 19* 18* 19* 24  GLUCOSE 150* 120* 128* 103* 103*  BUN 48* 43* 32* 21 18  CREATININE 4.07* 3.11* 1.72* 1.24 1.16  CALCIUM  8.0* 8.0* 8.1* 7.9* 8.2*  MG  --  1.9 2.2 2.0 1.9  PHOS  --   --  3.5 3.1 3.1     Recent Results (from the past 240 hours)  Resp panel by RT-PCR (RSV, Flu A&B, Covid) Anterior Nasal Swab     Status:  None   Collection Time: 06/09/24  9:08 PM   Specimen: Anterior Nasal Swab  Result Value Ref Range Status   SARS Coronavirus 2 by RT PCR NEGATIVE NEGATIVE Final    Comment: (NOTE) SARS-CoV-2 target nucleic acids are NOT DETECTED.  The SARS-CoV-2 RNA is generally detectable in upper respiratory specimens during the acute phase of infection. The lowest concentration of SARS-CoV-2 viral copies this assay can detect is 138 copies/mL. A negative result does not preclude SARS-Cov-2 infection and should not be used as the sole basis for treatment or other patient management decisions. A negative result may occur with  improper specimen collection/handling, submission of specimen other than nasopharyngeal swab, presence of viral mutation(s) within the areas targeted by this assay, and inadequate number of viral copies(<138 copies/mL). A negative result must be combined with clinical observations, patient history, and epidemiological information. The expected result is Negative.  Fact Sheet for Patients:  BloggerCourse.com  Fact Sheet for Healthcare Providers:  SeriousBroker.it  This test is no t yet approved or cleared by the  [place]  FDA and  has been authorized for detection and/or diagnosis of SARS-CoV-2 by FDA under an Emergency Use Authorization (EUA). This EUA will remain  in effect (meaning this test can be used) for the duration of the COVID-19 declaration under Section 564(b)(1) of the Act, 21 U.S.C.section 360bbb-3(b)(1), unless the authorization is terminated  or revoked sooner.       Influenza A by PCR NEGATIVE NEGATIVE Final   Influenza B by PCR NEGATIVE NEGATIVE Final    Comment: (NOTE) The Xpert Xpress SARS-CoV-2/FLU/RSV plus assay is intended as an aid in the diagnosis of influenza from Nasopharyngeal swab specimens and should not be used as a sole basis for treatment. Nasal washings and aspirates are unacceptable for Xpert Xpress SARS-CoV-2/FLU/RSV testing.  Fact Sheet for Patients: BloggerCourse.com  Fact Sheet for Healthcare Providers: SeriousBroker.it  This test is not yet approved or cleared by the [place]  FDA and has been authorized for detection and/or diagnosis of SARS-CoV-2 by FDA under an Emergency Use Authorization (EUA). This EUA will remain in effect (meaning this test can be used) for the duration of the COVID-19 declaration under Section 564(b)(1) of the Act, 21 U.S.C. section 360bbb-3(b)(1), unless the authorization is terminated or revoked.     Resp Syncytial Virus by PCR NEGATIVE NEGATIVE Final    Comment: (NOTE) Fact Sheet for Patients: BloggerCourse.com  Fact Sheet for Healthcare Providers: SeriousBroker.it  This test is not yet approved or cleared by the [place]  FDA and has been authorized for detection and/or diagnosis of SARS-CoV-2 by FDA under an Emergency Use Authorization (EUA). This EUA will remain in effect (meaning this test can be used) for the duration of the COVID-19 declaration under Section 564(b)(1) of the Act, 21 U.S.C. section  360bbb-3(b)(1), unless the authorization is terminated or revoked.  Performed at [facility], [address], [place], [place] [address]   Urine Culture     Status: Abnormal   Collection Time: 06/09/24  9:50 PM   Specimen: Urine, Clean Catch  Result Value Ref Range Status   Specimen Description   Final    URINE, CLEAN CATCH Performed at [facility], [address] [place], [place] [address]    Special Requests   Final    NONE Performed at [facility], [address] [place], [place] [address]    Culture (A)  Final    >=100,000 COLONIES/mL ESCHERICHIA COLI >=100,000 COLONIES/mL  ENTEROBACTER CLOACAE    Report Status 06/12/2024 FINAL  Final   Organism ID, Bacteria ESCHERICHIA COLI (A)  Final   Organism ID, Bacteria ENTEROBACTER CLOACAE (A)  Final      Susceptibility   Enterobacter cloacae - MIC*    CEFEPIME  <=0.12 SENSITIVE Sensitive     ERTAPENEM <=0.12 SENSITIVE Sensitive     CIPROFLOXACIN <=0.06 SENSITIVE Sensitive     GENTAMICIN <=1 SENSITIVE Sensitive     NITROFURANTOIN 64 INTERMEDIATE Intermediate     TRIMETH /SULFA  <=20 SENSITIVE Sensitive     PIP/TAZO Value in next row Sensitive ug/mL     <=4 SENSITIVEThis is a modified FDA-approved test that has been validated and its performance characteristics determined by the reporting laboratory.  This laboratory is certified under the Clinical Laboratory Improvement Amendments CLIA as qualified to perform high complexity clinical laboratory testing.    MEROPENEM Value in next row Sensitive      <=4 SENSITIVEThis is a modified FDA-approved test that has been validated and its performance characteristics determined by the reporting laboratory.  This laboratory is certified under the Clinical Laboratory Improvement Amendments CLIA as qualified to perform high complexity clinical laboratory testing.    * >=100,000 COLONIES/mL ENTEROBACTER CLOACAE   Escherichia coli - MIC*     AMPICILLIN Value in next row Resistant      <=4 SENSITIVEThis is a modified FDA-approved test that has been validated and its performance characteristics determined by the reporting laboratory.  This laboratory is certified under the Clinical Laboratory Improvement Amendments CLIA as qualified to perform high complexity clinical laboratory testing.    CEFAZOLIN (URINE) Value in next row Sensitive      16 SENSITIVEThis is a modified FDA-approved test that has been validated and its performance characteristics determined by the reporting laboratory.  This laboratory is certified under the Clinical Laboratory Improvement Amendments CLIA as qualified to perform high complexity clinical laboratory testing.    CEFEPIME  Value in next row Sensitive      16 SENSITIVEThis is a modified FDA-approved test that has been validated and its performance characteristics determined by the reporting laboratory.  This laboratory is certified under the Clinical Laboratory Improvement Amendments CLIA as qualified to perform high complexity clinical laboratory testing.    ERTAPENEM Value in next row Sensitive      16 SENSITIVEThis is a modified FDA-approved test that has been validated and its performance characteristics determined by the reporting laboratory.  This laboratory is certified under the Clinical Laboratory Improvement Amendments CLIA as qualified to perform high complexity clinical laboratory testing.    CEFTRIAXONE  Value in next row Sensitive      16 SENSITIVEThis is a modified FDA-approved test that has been validated and its performance characteristics determined by the reporting laboratory.  This laboratory is certified under the Clinical Laboratory Improvement Amendments CLIA as qualified to perform high complexity clinical laboratory testing.    CIPROFLOXACIN Value in next row Sensitive      16 SENSITIVEThis is a modified FDA-approved test that has been validated and its performance characteristics determined  by the reporting laboratory.  This laboratory is certified under the Clinical Laboratory Improvement Amendments CLIA as qualified to perform high complexity clinical laboratory testing.    GENTAMICIN Value in next row Sensitive      16 SENSITIVEThis is a modified FDA-approved test that has been validated and its performance characteristics determined by the reporting laboratory.  This laboratory is certified under the Clinical Laboratory Improvement Amendments CLIA as qualified to perform high complexity  clinical laboratory testing.    NITROFURANTOIN Value in next row Sensitive      16 SENSITIVEThis is a modified FDA-approved test that has been validated and its performance characteristics determined by the reporting laboratory.  This laboratory is certified under the Clinical Laboratory Improvement Amendments CLIA as qualified to perform high complexity clinical laboratory testing.    TRIMETH /SULFA  Value in next row Sensitive      16 SENSITIVEThis is a modified FDA-approved test that has been validated and its performance characteristics determined by the reporting laboratory.  This laboratory is certified under the Clinical Laboratory Improvement Amendments CLIA as qualified to perform high complexity clinical laboratory testing.    AMPICILLIN/SULBACTAM Value in next row Sensitive      16 SENSITIVEThis is a modified FDA-approved test that has been validated and its performance characteristics determined by the reporting laboratory.  This laboratory is certified under the Clinical Laboratory Improvement Amendments CLIA as qualified to perform high complexity clinical laboratory testing.    PIP/TAZO Value in next row Sensitive ug/mL     <=4 SENSITIVEThis is a modified FDA-approved test that has been validated and its performance characteristics determined by the reporting laboratory.  This laboratory is certified under the Clinical Laboratory Improvement Amendments CLIA as qualified to perform high  complexity clinical laboratory testing.    MEROPENEM Value in next row Sensitive      <=4 SENSITIVEThis is a modified FDA-approved test that has been validated and its performance characteristics determined by the reporting laboratory.  This laboratory is certified under the Clinical Laboratory Improvement Amendments CLIA as qualified to perform high complexity clinical laboratory testing.    * >=100,000 COLONIES/mL ESCHERICHIA COLI  MRSA Next Gen by PCR, Nasal     Status: Abnormal   Collection Time: 06/10/24  4:04 AM   Specimen: Nasal Mucosa; Nasal Swab  Result Value Ref Range Status   MRSA by PCR Next Gen DETECTED (A) NOT DETECTED Final    Comment: RESULT CALLED TO, READ BACK BY AND VERIFIED WITH: WILDA BATTLE RN AT [id number] ON 06/10/2024 BY JM (NOTE) The GeneXpert MRSA Assay (FDA approved for NASAL specimens only), is one component of a comprehensive MRSA colonization surveillance program. It is not intended to diagnose MRSA infection nor to guide or monitor treatment for MRSA infections. Test performance is not FDA approved in patients less than 55 years old. Performed at [facility], [address] [place], [place] [address]      Radiology Studies: DG CHEST PORT 1 VIEW Result Date: 06/12/2024 CLINICAL DATA:  Shortness of breath.  Urinary tract infection. EXAM: PORTABLE CHEST 1 VIEW COMPARISON:  05/15/2024 FINDINGS: Chronic cardiomegaly. Improvement in previously seen interstitial and alveolar edema. Lungs are clear today except for minimal volume loss at the left base. IMPRESSION: Improvement in previously seen interstitial and alveolar edema. Minimal volume loss at the left base. Electronically Signed   By: Oneil Officer M.D.   On: 06/12/2024 09:26    Scheduled Meds:  amantadine   100 mg Oral QHS   Chlorhexidine  Gluconate Cloth  6 each Topical Daily   cloZAPine   200 mg Oral BID   vitamin B-12  1,000 mcg Oral Daily   heparin   5,000 Units Subcutaneous Q8H    linaclotide   145 mcg Oral QAC breakfast   melatonin  10 mg Oral QHS   midodrine   10 mg Oral TID WC   mupirocin  ointment  1 Application Nasal BID   PARoxetine   40 mg Oral QPM   polyethylene  glycol  17 g Oral Daily   senna-docusate  1 tablet Oral BID   sulfamethoxazole -trimethoprim   1 tablet Oral Q12H   tamsulosin   0.4 mg Oral QPC supper   Continuous Infusions:   LOS: 3 days   Ivonne Mustache, MD [facility] P8/23/2025, 10:08 AM

## 2024-06-14 DIAGNOSIS — N390 Urinary tract infection, site not specified: Secondary | ICD-10-CM | POA: Diagnosis not present

## 2024-06-14 LAB — BASIC METABOLIC PANEL WITH GFR
Anion gap: 9 (ref 5–15)
BUN: 15 mg/dL (ref 8–23)
CO2: 22 mmol/L (ref 22–32)
Calcium: 8.4 mg/dL — ABNORMAL LOW (ref 8.9–10.3)
Chloride: 107 mmol/L (ref 98–111)
Creatinine, Ser: 0.83 mg/dL (ref 0.61–1.24)
GFR, Estimated: >60 mL/min (ref >60)
Glucose, Bld: 105 mg/dL — ABNORMAL HIGH (ref 70–99)
Potassium: 4.5 mmol/L (ref 3.5–5.1)
Sodium: 138 mmol/L (ref 135–145)

## 2024-06-14 LAB — CBC
HCT: 36.3 % — ABNORMAL LOW (ref 39.0–52.0)
Hemoglobin: 11.4 g/dL — ABNORMAL LOW (ref 13.0–17.0)
MCH: 30 pg (ref 26.0–34.0)
MCHC: 31.4 g/dL (ref 30.0–36.0)
MCV: 95.5 fL (ref 80.0–100.0)
Platelets: 357 K/uL (ref 150–400)
RBC: 3.8 MIL/uL — ABNORMAL LOW (ref 4.22–5.81)
RDW: 16.4 % — ABNORMAL HIGH (ref 11.5–15.5)
WBC: 20 K/uL — ABNORMAL HIGH (ref 4.0–10.5)
nRBC: 0 % (ref 0.0–0.2)

## 2024-06-14 NOTE — Progress Notes (Addendum)
 PROGRESS NOTE  Jordan Kennedy  FMW:[id number] DOB: 1956-05-11 DOA: 06/09/2024 PCP: [name] HERO, MD   Brief Narrative: Patient is a 68 year old male with history of schizophrenia, intellectual disability, HFrEF/biventricular heart failure, SBO, BPH who was recently admitted for acute renal failure secondary to bladder outlet obstruction and left hydroureteronephrosis requiring Foley catheter presented with complaint of abdominal pain, worsening confusion, low blood pressure.  On presentation, he was hypotensive with systolic blood pressure in the range of 80s.  Lab work showed WC count of 20.6, creatinine of 4.  UA suggestive of UTI.  CT head did not show any acute intracranial abnormalities.  CT abdomen/pelvis showed bilateral mild hydroureteronephrosis in the setting of fully decompressed urinary bladder lumen with associated mild urothelial thickening.  He was febrile on presentation.  Admitted for further management of septic shock likely from UTI and also AKI.  Rebound hypertensive with systolic blood pressure in the range of 70s critical care consulted and transferred to ICU on 8/20, started on pressors.  Currently off pressors, blood pressure improving.  Mental status is baseline.  PT recommending SNF on discharge.  Plan for discharge in next 1 to 2 days  Assessment & Plan:  Principal Problem:   UTI (urinary tract infection) Active Problems:   AKI (acute kidney injury) (HCC)   Chronic HFrEF (heart failure with reduced ejection fraction) (HCC)   Acute metabolic encephalopathy   Hyponatremia   Septic shock secondary to UTI: Presented with fever, tachycardia, leukocytosis. UA suspicious for UTI.  Started on broad spectrum antibiotics.  Continue midodrine  at current dose.  Currently obstructive blood pressure in the range of 90s and patient is asymptomatic.  Off pressures.   Urine culture showed E. coli and Enterobacter cloacae.  Currently on Bactrim .  Leukocytosis persist but he is  afebrile.  AKI: Likely from prerenal etiology, sepsis, hypotension.  Baseline creatinine ranges from 0.8-1.2.  Presented with creatinine in the range of 4. CT showing bilateral mild hydroureteronephrosis in the setting of a fully decompressed urinary bladder lumen with associated mild urothelial thickening.  Kidney function back to baseline  Acute metabolic encephalopathy: Most likely from sepsis.  CT head did not show any acute findings.  Ammonia level normal.  He is mostly alert and oriented this morning   Chronic HFrEF/biventricular heart failure: Echo done on July 2 and 24 showed EF of less than 20%, grade 1 diastolic function, moderately reduced right ventricular function.  Appeared dehydrated on presentation.  Given fluids, now is stopped.  Will resume his home CHF medications when his blood pressure allows   prolonged QTc: QTc of 561 on presentation.  Monitor on telemetry. Improved to 551.  Continue monitoring   History of chronic hypotension: Continue midodrine   History of bladder outlet obstruction: During his last hospitalization, Foley was placed.  Will give a voiding trial on 8/23 and he failed.  Foley placed back.  Needs to follow-up with urology as an outpatient.  Continue Flomax    BPH/schizophrenia: Home medications restarted   Debility/deconditioning: Patient from group home.  PT recommending SNF on discharge.  TOC consulted          DVT prophylaxis:heparin  injection 5,000 Units Start: 06/10/24 0600     Code Status: Full Code  Family Communication: None at bedside  Patient status:Inpatient  Patient is from :Group home  Anticipated discharge to:SNF  Estimated DC date:1-2 days   Consultants: PCCM  Procedures:None  Antimicrobials:  Anti-infectives (From admission, onward)    Start     Dose/Rate Route Frequency  Ordered Stop   06/12/24 1015  sulfamethoxazole -trimethoprim  (BACTRIM  DS) 800-160 MG per tablet 1 tablet        1 tablet Oral Every 12 hours 06/12/24  0915     06/10/24 2300  cefTRIAXone  (ROCEPHIN ) 1 g in sodium chloride  0.9 % 100 mL IVPB  Status:  Discontinued        1 g 200 mL/hr over 30 Minutes Intravenous Every 24 hours 06/10/24 0034 06/10/24 0815   06/10/24 2300  cefTRIAXone  (ROCEPHIN ) 2 g in sodium chloride  0.9 % 100 mL IVPB  Status:  Discontinued        2 g 200 mL/hr over 30 Minutes Intravenous Every 24 hours 06/10/24 0815 06/12/24 0902   06/09/24 2300  cefTRIAXone  (ROCEPHIN ) 1 g in sodium chloride  0.9 % 100 mL IVPB        1 g 200 mL/hr over 30 Minutes Intravenous  Once 06/09/24 2259 06/09/24 2335       Subjective: Patient seen and examined at bedside today.  He looks comfortable.  Blood pressure improved with systolic in the range of 90s.  Heart rate is stable.  Denies shortness of breath or cough.  On room air.  Foley had to be placed back yesterday.  Complains of some weakness and says she is not ready to go to the group home.  We discussed about plan for for SNF. He can be transferred to progressive unit  Objective: Vitals:   06/14/24 0700 06/14/24 0745 06/14/24 0800 06/14/24 0801  BP: 102/66   92/71  Pulse: 95 99  (!) 103  Resp: 16 (!) 24  20  Temp:   98.1 F (36.7 C)   TempSrc:   Oral   SpO2: 98% 98%  99%  Weight:      Height:        Intake/Output Summary (Last 24 hours) at 06/14/2024 0949 Last data filed at 06/14/2024 0844 Gross per 24 hour  Intake 909 ml  Output 4500 ml  Net -3591 ml   Filed Weights   06/11/24 0543 06/13/24 0341 06/14/24 0335  Weight: 87.4 kg 82.5 kg 79.1 kg    Examination:   General exam: Overall comfortable, not in distress HEENT: PERRL Respiratory system:  no wheezes or crackles  Cardiovascular system: S1 & S2 heard, RRR.  Gastrointestinal system: Abdomen is nondistended, soft and nontender. Central nervous system: Alert and oriented, mostly oriented Extremities: No edema, no clubbing ,no cyanosis Skin: No rashes, no ulcers,no icterus GU: Foley   Data Reviewed: I have  personally reviewed following labs and imaging studies  CBC: Recent Labs  Lab 06/09/24 2101 06/10/24 0331 06/11/24 0302 06/12/24 0303 06/13/24 0243 06/14/24 0242  WBC 20.6* 21.1* 20.4* 14.0* 17.9* 20.0*  NEUTROABS 17.0*  --   --   --   --   --   HGB 11.0* 11.5* 11.8* 11.5* 11.8* 11.4*  HCT 34.5* 35.4* 37.0* 37.0* 38.3* 36.3*  MCV 94.3 93.7 94.6 95.1 96.2 95.5  PLT 342 350 352 343 304 357   Basic Metabolic Panel: Recent Labs  Lab 06/10/24 0331 06/11/24 0302 06/12/24 0303 06/13/24 0243 06/14/24 0242  NA 134* 133* 134* 138 138  K 4.2 4.0 4.3 4.4 4.5  CL 105 103 104 107 107  CO2 19* 18* 19* 24 22  GLUCOSE 120* 128* 103* 103* 105*  BUN 43* 32* 21 18 15   CREATININE 3.11* 1.72* 1.24 1.16 0.83  CALCIUM  8.0* 8.1* 7.9* 8.2* 8.4*  MG 1.9 2.2 2.0 1.9  --   PHOS  --  3.5 3.1 3.1  --      Recent Results (from the past 240 hours)  Resp panel by RT-PCR (RSV, Flu A&B, Covid) Anterior Nasal Swab     Status: None   Collection Time: 06/09/24  9:08 PM   Specimen: Anterior Nasal Swab  Result Value Ref Range Status   SARS Coronavirus 2 by RT PCR NEGATIVE NEGATIVE Final    Comment: (NOTE) SARS-CoV-2 target nucleic acids are NOT DETECTED.  The SARS-CoV-2 RNA is generally detectable in upper respiratory specimens during the acute phase of infection. The lowest concentration of SARS-CoV-2 viral copies this assay can detect is 138 copies/mL. A negative result does not preclude SARS-Cov-2 infection and should not be used as the sole basis for treatment or other patient management decisions. A negative result may occur with  improper specimen collection/handling, submission of specimen other than nasopharyngeal swab, presence of viral mutation(s) within the areas targeted by this assay, and inadequate number of viral copies(<138 copies/mL). A negative result must be combined with clinical observations, patient history, and epidemiological information. The expected result is  Negative.  Fact Sheet for Patients:  BloggerCourse.com  Fact Sheet for Healthcare Providers:  SeriousBroker.it  This test is no t yet approved or cleared by the [place]  FDA and  has been authorized for detection and/or diagnosis of SARS-CoV-2 by FDA under an Emergency Use Authorization (EUA). This EUA will remain  in effect (meaning this test can be used) for the duration of the COVID-19 declaration under Section 564(b)(1) of the Act, 21 U.S.C.section 360bbb-3(b)(1), unless the authorization is terminated  or revoked sooner.       Influenza A by PCR NEGATIVE NEGATIVE Final   Influenza B by PCR NEGATIVE NEGATIVE Final    Comment: (NOTE) The Xpert Xpress SARS-CoV-2/FLU/RSV plus assay is intended as an aid in the diagnosis of influenza from Nasopharyngeal swab specimens and should not be used as a sole basis for treatment. Nasal washings and aspirates are unacceptable for Xpert Xpress SARS-CoV-2/FLU/RSV testing.  Fact Sheet for Patients: BloggerCourse.com  Fact Sheet for Healthcare Providers: SeriousBroker.it  This test is not yet approved or cleared by the [place]  FDA and has been authorized for detection and/or diagnosis of SARS-CoV-2 by FDA under an Emergency Use Authorization (EUA). This EUA will remain in effect (meaning this test can be used) for the duration of the COVID-19 declaration under Section 564(b)(1) of the Act, 21 U.S.C. section 360bbb-3(b)(1), unless the authorization is terminated or revoked.     Resp Syncytial Virus by PCR NEGATIVE NEGATIVE Final    Comment: (NOTE) Fact Sheet for Patients: BloggerCourse.com  Fact Sheet for Healthcare Providers: SeriousBroker.it  This test is not yet approved or cleared by the [place]  FDA and has been authorized for detection and/or diagnosis of  SARS-CoV-2 by FDA under an Emergency Use Authorization (EUA). This EUA will remain in effect (meaning this test can be used) for the duration of the COVID-19 declaration under Section 564(b)(1) of the Act, 21 U.S.C. section 360bbb-3(b)(1), unless the authorization is terminated or revoked.  Performed at [facility], [address] [place], [place] [address]   Urine Culture     Status: Abnormal   Collection Time: 06/09/24  9:50 PM   Specimen: Urine, Clean Catch  Result Value Ref Range Status   Specimen Description   Final    URINE, CLEAN CATCH Performed at [facility], [address], [place], [place] [address]    Special Requests  Final    NONE Performed at [facility], [address], [place], [place] [address]    Culture (A)  Final    >=100,000 COLONIES/mL ESCHERICHIA COLI >=100,000 COLONIES/mL ENTEROBACTER CLOACAE    Report Status 06/12/2024 FINAL  Final   Organism ID, Bacteria ESCHERICHIA COLI (A)  Final   Organism ID, Bacteria ENTEROBACTER CLOACAE (A)  Final      Susceptibility   Enterobacter cloacae - MIC*    CEFEPIME  <=0.12 SENSITIVE Sensitive     ERTAPENEM <=0.12 SENSITIVE Sensitive     CIPROFLOXACIN <=0.06 SENSITIVE Sensitive     GENTAMICIN <=1 SENSITIVE Sensitive     NITROFURANTOIN 64 INTERMEDIATE Intermediate     TRIMETH /SULFA  <=20 SENSITIVE Sensitive     PIP/TAZO Value in next row Sensitive ug/mL     <=4 SENSITIVEThis is a modified FDA-approved test that has been validated and its performance characteristics determined by the reporting laboratory.  This laboratory is certified under the Clinical Laboratory Improvement Amendments CLIA as qualified to perform high complexity clinical laboratory testing.    MEROPENEM Value in next row Sensitive      <=4 SENSITIVEThis is a modified FDA-approved test that has been validated and its performance characteristics determined by the reporting laboratory.  This  laboratory is certified under the Clinical Laboratory Improvement Amendments CLIA as qualified to perform high complexity clinical laboratory testing.    * >=100,000 COLONIES/mL ENTEROBACTER CLOACAE   Escherichia coli - MIC*    AMPICILLIN Value in next row Resistant      <=4 SENSITIVEThis is a modified FDA-approved test that has been validated and its performance characteristics determined by the reporting laboratory.  This laboratory is certified under the Clinical Laboratory Improvement Amendments CLIA as qualified to perform high complexity clinical laboratory testing.    CEFAZOLIN (URINE) Value in next row Sensitive      16 SENSITIVEThis is a modified FDA-approved test that has been validated and its performance characteristics determined by the reporting laboratory.  This laboratory is certified under the Clinical Laboratory Improvement Amendments CLIA as qualified to perform high complexity clinical laboratory testing.    CEFEPIME  Value in next row Sensitive      16 SENSITIVEThis is a modified FDA-approved test that has been validated and its performance characteristics determined by the reporting laboratory.  This laboratory is certified under the Clinical Laboratory Improvement Amendments CLIA as qualified to perform high complexity clinical laboratory testing.    ERTAPENEM Value in next row Sensitive      16 SENSITIVEThis is a modified FDA-approved test that has been validated and its performance characteristics determined by the reporting laboratory.  This laboratory is certified under the Clinical Laboratory Improvement Amendments CLIA as qualified to perform high complexity clinical laboratory testing.    CEFTRIAXONE  Value in next row Sensitive      16 SENSITIVEThis is a modified FDA-approved test that has been validated and its performance characteristics determined by the reporting laboratory.  This laboratory is certified under the Clinical Laboratory Improvement Amendments CLIA as  qualified to perform high complexity clinical laboratory testing.    CIPROFLOXACIN Value in next row Sensitive      16 SENSITIVEThis is a modified FDA-approved test that has been validated and its performance characteristics determined by the reporting laboratory.  This laboratory is certified under the Clinical Laboratory Improvement Amendments CLIA as qualified to perform high complexity clinical laboratory testing.    GENTAMICIN Value in next row Sensitive      16 SENSITIVEThis is a  modified FDA-approved test that has been validated and its performance characteristics determined by the reporting laboratory.  This laboratory is certified under the Clinical Laboratory Improvement Amendments CLIA as qualified to perform high complexity clinical laboratory testing.    NITROFURANTOIN Value in next row Sensitive      16 SENSITIVEThis is a modified FDA-approved test that has been validated and its performance characteristics determined by the reporting laboratory.  This laboratory is certified under the Clinical Laboratory Improvement Amendments CLIA as qualified to perform high complexity clinical laboratory testing.    TRIMETH /SULFA  Value in next row Sensitive      16 SENSITIVEThis is a modified FDA-approved test that has been validated and its performance characteristics determined by the reporting laboratory.  This laboratory is certified under the Clinical Laboratory Improvement Amendments CLIA as qualified to perform high complexity clinical laboratory testing.    AMPICILLIN/SULBACTAM Value in next row Sensitive      16 SENSITIVEThis is a modified FDA-approved test that has been validated and its performance characteristics determined by the reporting laboratory.  This laboratory is certified under the Clinical Laboratory Improvement Amendments CLIA as qualified to perform high complexity clinical laboratory testing.    PIP/TAZO Value in next row Sensitive ug/mL     <=4 SENSITIVEThis is a modified  FDA-approved test that has been validated and its performance characteristics determined by the reporting laboratory.  This laboratory is certified under the Clinical Laboratory Improvement Amendments CLIA as qualified to perform high complexity clinical laboratory testing.    MEROPENEM Value in next row Sensitive      <=4 SENSITIVEThis is a modified FDA-approved test that has been validated and its performance characteristics determined by the reporting laboratory.  This laboratory is certified under the Clinical Laboratory Improvement Amendments CLIA as qualified to perform high complexity clinical laboratory testing.    * >=100,000 COLONIES/mL ESCHERICHIA COLI  MRSA Next Gen by PCR, Nasal     Status: Abnormal   Collection Time: 06/10/24  4:04 AM   Specimen: Nasal Mucosa; Nasal Swab  Result Value Ref Range Status   MRSA by PCR Next Gen DETECTED (A) NOT DETECTED Final    Comment: RESULT CALLED TO, READ BACK BY AND VERIFIED WITH: WILDA BATTLE RN AT [phone] ON 06/10/2024 BY JM (NOTE) The GeneXpert MRSA Assay (FDA approved for NASAL specimens only), is one component of a comprehensive MRSA colonization surveillance program. It is not intended to diagnose MRSA infection nor to guide or monitor treatment for MRSA infections. Test performance is not FDA approved in patients less than 47 years old. Performed at [facility], [address] [place], [place] [address]      Radiology Studies: No results found.   Scheduled Meds:  amantadine   100 mg Oral QHS   Chlorhexidine  Gluconate Cloth  6 each Topical Daily   cloZAPine   200 mg Oral BID   vitamin B-12  1,000 mcg Oral Daily   heparin   5,000 Units Subcutaneous Q8H   linaclotide   145 mcg Oral QAC breakfast   melatonin  10 mg Oral QHS   midodrine   10 mg Oral TID WC   mupirocin  ointment  1 Application Nasal BID   PARoxetine   40 mg Oral QPM   polyethylene glycol  17 g Oral Daily   senna-docusate  1 tablet Oral BID    sulfamethoxazole -trimethoprim   1 tablet Oral Q12H   tamsulosin   0.4 mg Oral QPC supper   Continuous Infusions:   LOS: 4 days   Jahzion Brogden,  MD [facility] P8/24/2025, 9:49 AM

## 2024-06-14 NOTE — Plan of Care (Signed)
  Problem: Clinical Measurements: Goal: Cardiovascular complication will be avoided Outcome: Progressing   Problem: Activity: Goal: Risk for activity intolerance will decrease Outcome: Progressing   Problem: Nutrition: Goal: Adequate nutrition will be maintained Outcome: Progressing   Problem: Pain Managment: Goal: General experience of comfort will improve and/or be controlled Outcome: Progressing   Problem: Safety: Goal: Ability to remain free from injury will improve Outcome: Progressing   Problem: Skin Integrity: Goal: Risk for impaired skin integrity will decrease Outcome: Progressing   Problem: Respiratory: Goal: Ability to maintain adequate ventilation will improve Outcome: Progressing

## 2024-06-15 DIAGNOSIS — N390 Urinary tract infection, site not specified: Secondary | ICD-10-CM | POA: Diagnosis not present

## 2024-06-15 LAB — CBC
HCT: 35.9 % — ABNORMAL LOW (ref 39.0–52.0)
Hemoglobin: 11.5 g/dL — ABNORMAL LOW (ref 13.0–17.0)
MCH: 30 pg (ref 26.0–34.0)
MCHC: 32 g/dL (ref 30.0–36.0)
MCV: 93.7 fL (ref 80.0–100.0)
Platelets: 381 K/uL (ref 150–400)
RBC: 3.83 MIL/uL — ABNORMAL LOW (ref 4.22–5.81)
RDW: 16.2 % — ABNORMAL HIGH (ref 11.5–15.5)
WBC: 16.2 K/uL — ABNORMAL HIGH (ref 4.0–10.5)
nRBC: 0 % (ref 0.0–0.2)

## 2024-06-15 NOTE — Progress Notes (Signed)
 PROGRESS NOTE  Jordan Kennedy  FMW:[id number] DOB: 1956/09/01 DOA: 06/09/2024 PCP: [name] HERO, MD   Brief Narrative: Patient is a 68 year old male with history of schizophrenia, intellectual disability, HFrEF/biventricular heart failure, SBO, BPH who was recently admitted for acute renal failure secondary to bladder outlet obstruction and left hydroureteronephrosis requiring Foley catheter presented with complaint of abdominal pain, worsening confusion, low blood pressure.  On presentation, he was hypotensive with systolic blood pressure in the range of 80s.  Lab work showed WC count of 20.6, creatinine of 4.  UA suggestive of UTI.  CT head did not show any acute intracranial abnormalities.  CT abdomen/pelvis showed bilateral mild hydroureteronephrosis in the setting of fully decompressed urinary bladder lumen with associated mild urothelial thickening.  He was febrile on presentation.  Admitted for further management of septic shock likely from UTI and also AKI.  Rebound hypertensive with systolic blood pressure in the range of 70s critical care consulted and transferred to ICU on 8/20, started on pressors.  Currently off pressors, blood pressure improving.  Mental status is baseline.  PT recommending SNF on discharge.  Medically stable for discharge to SNF whenever possible.  TOC following  Assessment & Plan:  Principal Problem:   UTI (urinary tract infection) Active Problems:   AKI (acute kidney injury) (HCC)   Chronic HFrEF (heart failure with reduced ejection fraction) (HCC)   Acute metabolic encephalopathy   Hyponatremia   Septic shock secondary to UTI: Presented with fever, tachycardia, leukocytosis. UA suspicious for UTI.  Started on broad spectrum antibiotics.  Continue midodrine  at current dose.  Off pressures.   Urine culture showed E. coli and Enterobacter cloacae.  Currently on Bactrim .  Leukocytosis persist but he is afebrile.  AKI: Likely from prerenal etiology, sepsis,  hypotension.  Baseline creatinine ranges from 0.8-1.2.  Presented with creatinine in the range of 4. CT showing bilateral mild hydroureteronephrosis in the setting of a fully decompressed urinary bladder lumen with associated mild urothelial thickening.  Kidney function back to baseline  Acute metabolic encephalopathy: Most likely from sepsis.  CT head did not show any acute findings.  Ammonia level normal.  He is mostly alert and oriented    Chronic HFrEF/biventricular heart failure: Echo done on July 2 and 24 showed EF of less than 20%, grade 1 diastolic function, moderately reduced right ventricular function.  Appeared dehydrated on presentation.  Given fluids, now is stopped.  Will resume his home CHF medications when his blood pressure allows   prolonged QTc: QTc of 561 on presentation.  Monitor on telemetry. Improved to 551.  Continue monitoring   History of chronic hypotension: Continue midodrine   History of bladder outlet obstruction: During his last hospitalization, Foley was placed.  Given  a voiding trial on 8/23 and he failed.  Foley placed back.  Needs to follow-up with urology as an outpatient.  Continue Flomax    BPH/schizophrenia: Home medications restarted   Debility/deconditioning: Patient from group home.  PT recommending SNF on discharge.  TOC consulted          DVT prophylaxis:heparin  injection 5,000 Units Start: 06/10/24 0600     Code Status: Full Code  Family Communication: None at bedside  Patient status:Inpatient  Patient is from :Group home  Anticipated discharge to:SNF  Estimated DC date:whenever possible   Consultants: PCCM  Procedures:None  Antimicrobials:  Anti-infectives (From admission, onward)    Start     Dose/Rate Route Frequency Ordered Stop   06/12/24 1015  sulfamethoxazole -trimethoprim  (BACTRIM  DS) 800-160 MG per  tablet 1 tablet        1 tablet Oral Every 12 hours 06/12/24 0915 06/17/24 0959   06/10/24 2300  cefTRIAXone  (ROCEPHIN ) 1  g in sodium chloride  0.9 % 100 mL IVPB  Status:  Discontinued        1 g 200 mL/hr over 30 Minutes Intravenous Every 24 hours 06/10/24 0034 06/10/24 0815   06/10/24 2300  cefTRIAXone  (ROCEPHIN ) 2 g in sodium chloride  0.9 % 100 mL IVPB  Status:  Discontinued        2 g 200 mL/hr over 30 Minutes Intravenous Every 24 hours 06/10/24 0815 06/12/24 0902   06/09/24 2300  cefTRIAXone  (ROCEPHIN ) 1 g in sodium chloride  0.9 % 100 mL IVPB        1 g 200 mL/hr over 30 Minutes Intravenous  Once 06/09/24 2259 06/09/24 2335       Subjective: Patient seen and examined at bedside today.  Hemodynamically stable.  Comfortable.  No new complaints.  Alert and oriented.  Had some loose stools yesterday but no nausea or vomiting.  No abdominal pain.  Abdomen is benign on examination.  Foley in place.  Objective: Vitals:   06/15/24 0600 06/15/24 0700 06/15/24 0716 06/15/24 0746  BP: 99/78 113/86    Pulse: 98 (!) 104  98  Resp: (!) 21 17  11   Temp:   98.3 F (36.8 C)   TempSrc:   Oral   SpO2: 98% 100%  98%  Weight:      Height:        Intake/Output Summary (Last 24 hours) at 06/15/2024 0935 Last data filed at 06/15/2024 9177 Gross per 24 hour  Intake 710 ml  Output 3600 ml  Net -2890 ml   Filed Weights   06/11/24 0543 06/13/24 0341 06/14/24 0335  Weight: 87.4 kg 82.5 kg 79.1 kg    Examination:  General exam: Overall comfortable, not in distress HEENT: PERRL Respiratory system:  no wheezes or crackles  Cardiovascular system: S1 & S2 heard, RRR.  Gastrointestinal system: Abdomen is nondistended, soft and nontender. Central nervous system: Alert and oriented Extremities: No edema, no clubbing ,no cyanosis Skin: No rashes, no ulcers,no icterus   GU: Foley   Data Reviewed: I have personally reviewed following labs and imaging studies  CBC: Recent Labs  Lab 06/09/24 2101 06/10/24 0331 06/11/24 0302 06/12/24 0303 06/13/24 0243 06/14/24 0242 06/15/24 0244  WBC 20.6*   < > 20.4* 14.0*  17.9* 20.0* 16.2*  NEUTROABS 17.0*  --   --   --   --   --   --   HGB 11.0*   < > 11.8* 11.5* 11.8* 11.4* 11.5*  HCT 34.5*   < > 37.0* 37.0* 38.3* 36.3* 35.9*  MCV 94.3   < > 94.6 95.1 96.2 95.5 93.7  PLT 342   < > 352 343 304 357 381   < > = values in this interval not displayed.   Basic Metabolic Panel: Recent Labs  Lab 06/10/24 0331 06/11/24 0302 06/12/24 0303 06/13/24 0243 06/14/24 0242  NA 134* 133* 134* 138 138  K 4.2 4.0 4.3 4.4 4.5  CL 105 103 104 107 107  CO2 19* 18* 19* 24 22  GLUCOSE 120* 128* 103* 103* 105*  BUN 43* 32* 21 18 15   CREATININE 3.11* 1.72* 1.24 1.16 0.83  CALCIUM  8.0* 8.1* 7.9* 8.2* 8.4*  MG 1.9 2.2 2.0 1.9  --   PHOS  --  3.5 3.1 3.1  --  Recent Results (from the past 240 hours)  Resp panel by RT-PCR (RSV, Flu A&B, Covid) Anterior Nasal Swab     Status: None   Collection Time: 06/09/24  9:08 PM   Specimen: Anterior Nasal Swab  Result Value Ref Range Status   SARS Coronavirus 2 by RT PCR NEGATIVE NEGATIVE Final    Comment: (NOTE) SARS-CoV-2 target nucleic acids are NOT DETECTED.  The SARS-CoV-2 RNA is generally detectable in upper respiratory specimens during the acute phase of infection. The lowest concentration of SARS-CoV-2 viral copies this assay can detect is 138 copies/mL. A negative result does not preclude SARS-Cov-2 infection and should not be used as the sole basis for treatment or other patient management decisions. A negative result may occur with  improper specimen collection/handling, submission of specimen other than nasopharyngeal swab, presence of viral mutation(s) within the areas targeted by this assay, and inadequate number of viral copies(<138 copies/mL). A negative result must be combined with clinical observations, patient history, and epidemiological information. The expected result is Negative.  Fact Sheet for Patients:  BloggerCourse.com  Fact Sheet for Healthcare Providers:   SeriousBroker.it  This test is no t yet approved or cleared by the [place]  FDA and  has been authorized for detection and/or diagnosis of SARS-CoV-2 by FDA under an Emergency Use Authorization (EUA). This EUA will remain  in effect (meaning this test can be used) for the duration of the COVID-19 declaration under Section 564(b)(1) of the Act, 21 U.S.C.section 360bbb-3(b)(1), unless the authorization is terminated  or revoked sooner.       Influenza A by PCR NEGATIVE NEGATIVE Final   Influenza B by PCR NEGATIVE NEGATIVE Final    Comment: (NOTE) The Xpert Xpress SARS-CoV-2/FLU/RSV plus assay is intended as an aid in the diagnosis of influenza from Nasopharyngeal swab specimens and should not be used as a sole basis for treatment. Nasal washings and aspirates are unacceptable for Xpert Xpress SARS-CoV-2/FLU/RSV testing.  Fact Sheet for Patients: BloggerCourse.com  Fact Sheet for Healthcare Providers: SeriousBroker.it  This test is not yet approved or cleared by the [place]  FDA and has been authorized for detection and/or diagnosis of SARS-CoV-2 by FDA under an Emergency Use Authorization (EUA). This EUA will remain in effect (meaning this test can be used) for the duration of the COVID-19 declaration under Section 564(b)(1) of the Act, 21 U.S.C. section 360bbb-3(b)(1), unless the authorization is terminated or revoked.     Resp Syncytial Virus by PCR NEGATIVE NEGATIVE Final    Comment: (NOTE) Fact Sheet for Patients: BloggerCourse.com  Fact Sheet for Healthcare Providers: SeriousBroker.it  This test is not yet approved or cleared by the [place]  FDA and has been authorized for detection and/or diagnosis of SARS-CoV-2 by FDA under an Emergency Use Authorization (EUA). This EUA will remain in effect (meaning this test can be used) for  the duration of the COVID-19 declaration under Section 564(b)(1) of the Act, 21 U.S.C. section 360bbb-3(b)(1), unless the authorization is terminated or revoked.  Performed at [facility], [address], [place], [place] [address]   Urine Culture     Status: Abnormal   Collection Time: 06/09/24  9:50 PM   Specimen: Urine, Clean Catch  Result Value Ref Range Status   Specimen Description   Final    URINE, CLEAN CATCH Performed at [facility], [address] [place], [place] [address]    Special Requests   Final    NONE Performed at [facility], [address] [place], [place] [address]    Culture (A)  Final    >=100,000 COLONIES/mL ESCHERICHIA COLI >=100,000 COLONIES/mL ENTEROBACTER CLOACAE    Report Status 06/12/2024 FINAL  Final   Organism ID, Bacteria ESCHERICHIA COLI (A)  Final   Organism ID, Bacteria ENTEROBACTER CLOACAE (A)  Final      Susceptibility   Enterobacter cloacae - MIC*    CEFEPIME  <=0.12 SENSITIVE Sensitive     ERTAPENEM <=0.12 SENSITIVE Sensitive     CIPROFLOXACIN <=0.06 SENSITIVE Sensitive     GENTAMICIN <=1 SENSITIVE Sensitive     NITROFURANTOIN 64 INTERMEDIATE Intermediate     TRIMETH /SULFA  <=20 SENSITIVE Sensitive     PIP/TAZO Value in next row Sensitive ug/mL     <=4 SENSITIVEThis is a modified FDA-approved test that has been validated and its performance characteristics determined by the reporting laboratory.  This laboratory is certified under the Clinical Laboratory Improvement Amendments CLIA as qualified to perform high complexity clinical laboratory testing.    MEROPENEM Value in next row Sensitive      <=4 SENSITIVEThis is a modified FDA-approved test that has been validated and its performance characteristics determined by the reporting laboratory.  This laboratory is certified under the Clinical Laboratory Improvement Amendments CLIA as qualified to perform high complexity clinical  laboratory testing.    * >=100,000 COLONIES/mL ENTEROBACTER CLOACAE   Escherichia coli - MIC*    AMPICILLIN Value in next row Resistant      <=4 SENSITIVEThis is a modified FDA-approved test that has been validated and its performance characteristics determined by the reporting laboratory.  This laboratory is certified under the Clinical Laboratory Improvement Amendments CLIA as qualified to perform high complexity clinical laboratory testing.    CEFAZOLIN (URINE) Value in next row Sensitive      16 SENSITIVEThis is a modified FDA-approved test that has been validated and its performance characteristics determined by the reporting laboratory.  This laboratory is certified under the Clinical Laboratory Improvement Amendments CLIA as qualified to perform high complexity clinical laboratory testing.    CEFEPIME  Value in next row Sensitive      16 SENSITIVEThis is a modified FDA-approved test that has been validated and its performance characteristics determined by the reporting laboratory.  This laboratory is certified under the Clinical Laboratory Improvement Amendments CLIA as qualified to perform high complexity clinical laboratory testing.    ERTAPENEM Value in next row Sensitive      16 SENSITIVEThis is a modified FDA-approved test that has been validated and its performance characteristics determined by the reporting laboratory.  This laboratory is certified under the Clinical Laboratory Improvement Amendments CLIA as qualified to perform high complexity clinical laboratory testing.    CEFTRIAXONE  Value in next row Sensitive      16 SENSITIVEThis is a modified FDA-approved test that has been validated and its performance characteristics determined by the reporting laboratory.  This laboratory is certified under the Clinical Laboratory Improvement Amendments CLIA as qualified to perform high complexity clinical laboratory testing.    CIPROFLOXACIN Value in next row Sensitive      16 SENSITIVEThis is  a modified FDA-approved test that has been validated and its performance characteristics determined by the reporting laboratory.  This laboratory is certified under the Clinical Laboratory Improvement Amendments CLIA as qualified to perform high complexity clinical laboratory testing.    GENTAMICIN Value in next row Sensitive      16 SENSITIVEThis is a modified FDA-approved test that has been validated and its performance  characteristics determined by the reporting laboratory.  This laboratory is certified under the Clinical Laboratory Improvement Amendments CLIA as qualified to perform high complexity clinical laboratory testing.    NITROFURANTOIN Value in next row Sensitive      16 SENSITIVEThis is a modified FDA-approved test that has been validated and its performance characteristics determined by the reporting laboratory.  This laboratory is certified under the Clinical Laboratory Improvement Amendments CLIA as qualified to perform high complexity clinical laboratory testing.    TRIMETH /SULFA  Value in next row Sensitive      16 SENSITIVEThis is a modified FDA-approved test that has been validated and its performance characteristics determined by the reporting laboratory.  This laboratory is certified under the Clinical Laboratory Improvement Amendments CLIA as qualified to perform high complexity clinical laboratory testing.    AMPICILLIN/SULBACTAM Value in next row Sensitive      16 SENSITIVEThis is a modified FDA-approved test that has been validated and its performance characteristics determined by the reporting laboratory.  This laboratory is certified under the Clinical Laboratory Improvement Amendments CLIA as qualified to perform high complexity clinical laboratory testing.    PIP/TAZO Value in next row Sensitive ug/mL     <=4 SENSITIVEThis is a modified FDA-approved test that has been validated and its performance characteristics determined by the reporting laboratory.  This laboratory is  certified under the Clinical Laboratory Improvement Amendments CLIA as qualified to perform high complexity clinical laboratory testing.    MEROPENEM Value in next row Sensitive      <=4 SENSITIVEThis is a modified FDA-approved test that has been validated and its performance characteristics determined by the reporting laboratory.  This laboratory is certified under the Clinical Laboratory Improvement Amendments CLIA as qualified to perform high complexity clinical laboratory testing.    * >=100,000 COLONIES/mL ESCHERICHIA COLI  MRSA Next Gen by PCR, Nasal     Status: Abnormal   Collection Time: 06/10/24  4:04 AM   Specimen: Nasal Mucosa; Nasal Swab  Result Value Ref Range Status   MRSA by PCR Next Gen DETECTED (A) NOT DETECTED Final    Comment: RESULT CALLED TO, READ BACK BY AND VERIFIED WITH: WILDA BATTLE RN AT [phone] ON 06/10/2024 BY JM (NOTE) The GeneXpert MRSA Assay (FDA approved for NASAL specimens only), is one component of a comprehensive MRSA colonization surveillance program. It is not intended to diagnose MRSA infection nor to guide or monitor treatment for MRSA infections. Test performance is not FDA approved in patients less than 1 years old. Performed at [facility], [address] [place], [place] [address]      Radiology Studies: No results found.   Scheduled Meds:  amantadine   100 mg Oral QHS   Chlorhexidine  Gluconate Cloth  6 each Topical Daily   cloZAPine   200 mg Oral BID   vitamin B-12  1,000 mcg Oral Daily   heparin   5,000 Units Subcutaneous Q8H   linaclotide   145 mcg Oral QAC breakfast   melatonin  10 mg Oral QHS   midodrine   10 mg Oral TID WC   PARoxetine   40 mg Oral QPM   polyethylene glycol  17 g Oral Daily   senna-docusate  1 tablet Oral BID   sulfamethoxazole -trimethoprim   1 tablet Oral Q12H   tamsulosin   0.4 mg Oral QPC supper   Continuous Infusions:   LOS: 5 days   Ivonne Mustache, MD [facility] P8/25/2025, 9:35 AM

## 2024-06-15 NOTE — Progress Notes (Addendum)
 Physical Therapy Treatment Patient Details Name: Jordan Kennedy MRN: [id number] DOB: 08-02-1956 Today's Date: 06/15/2024   History of Present Illness Pt is 68 yo male admitted on 06/09/24 with septic shock secondary to UTI, AKI, acute metabolic encephalopathy. Pt recently admitted 05/15/24. PMH includes schizophrenia, intellectual disability, tardive dyskinesia, chronic constipation, ileus, SBO, BPH.    PT Comments  Pt demonstrating gradual progression.  He did perseverate on food and getting back to bed but did agree to ambulation with encouragement.  Pt unsteady and ataxic with mobility requiring min A for balance and mod cues for safety.  Continue POC. Do still recommend Patient will benefit from continued inpatient follow up therapy, <3 hours/day at this time; however, if continues to improve could return home if 24 hr assist available.    If plan is discharge home, recommend the following: A little help with walking and/or transfers;A little help with bathing/dressing/bathroom;Assistance with cooking/housework;Help with stairs or ramp for entrance   Can travel by private vehicle     Yes  Equipment Recommendations  None recommended by PT    Recommendations for Other Services       Precautions / Restrictions Precautions Precautions: Fall     Mobility  Bed Mobility Overal bed mobility: Needs Assistance Bed Mobility: Sit to Supine       Sit to supine: Supervision        Transfers Overall transfer level: Needs assistance Equipment used: Rolling walker (2 wheels) Transfers: Sit to/from Stand Sit to Stand: Min assist           General transfer comment: Impulsive requiring cues to wait for lines/leads    Ambulation/Gait Ambulation/Gait assistance: Min assist Gait Distance (Feet): 40 Feet Assistive device: Rolling walker (2 wheels) Gait Pattern/deviations: Step-through pattern, Decreased stride length, Ataxic Gait velocity: decreased     General Gait Details:  Using RW, min A to stabilize with turns, increased lordosis and posterior lean   Stairs             Wheelchair Mobility     Tilt Bed    Modified Rankin (Stroke Patients Only)       Balance Overall balance assessment: Needs assistance Sitting-balance support: No upper extremity supported, Feet supported Sitting balance-Leahy Scale: Fair     Standing balance support: Bilateral upper extremity supported, During functional activity Standing balance-Leahy Scale: Poor Standing balance comment: Using RW and min A at times                            Communication    Cognition Arousal: Alert Behavior During Therapy: Impulsive   PT - Cognitive impairments: History of cognitive impairments                       PT - Cognition Comments: Pt perserverating on eating and getting back to bed        Cueing    Exercises      General Comments        Pertinent Vitals/Pain Pain Assessment Pain Assessment: No/denies pain    Home Living                          Prior Function            PT Goals (current goals can now be found in the care plan section) Progress towards PT goals: Progressing toward goals    Frequency    Min  2X/week      PT Plan      Co-evaluation              AM-PAC PT 6 Clicks Mobility   Outcome Measure  Help needed turning from your back to your side while in a flat bed without using bedrails?: A Little Help needed moving from lying on your back to sitting on the side of a flat bed without using bedrails?: A Little Help needed moving to and from a bed to a chair (including a wheelchair)?: A Little Help needed standing up from a chair using your arms (e.g., wheelchair or bedside chair)?: A Lot Help needed to walk in hospital room?: A Lot Help needed climbing 3-5 steps with a railing? : Total 6 Click Score: 14    End of Session Equipment Utilized During Treatment: Gait belt Activity Tolerance:  Patient tolerated treatment well Patient left: with call bell/phone within reach;in bed;with bed alarm set Nurse Communication: Mobility status PT Visit Diagnosis: Other abnormalities of gait and mobility (R26.89);Muscle weakness (generalized) (M62.81)     Time: 8759-8747 PT Time Calculation (min) (ACUTE ONLY): 12 min  Charges:    $Gait Training: 8-22 mins PT General Charges $$ ACUTE PT VISIT: 1 Visit                     Jordan, PT Acute Rehab [facility] [phone]    Jordan Kennedy 06/15/2024, 1:33 PM

## 2024-06-15 NOTE — Progress Notes (Signed)
 Report called to 4E charge nurse. Legal guardian, Sherleen, called & left a voice mail. Patient's belongings collected (clothing). Patient stable at time of transfer.

## 2024-06-15 NOTE — TOC Progression Note (Signed)
 Transition of Care [facility]) - Progression Note    Patient Details  Name: Jordan Kennedy MRN: [id number] Date of Birth: 01-Oct-1956  [facility] of [facility]) CM/SW Contact  Jon ONEIDA Anon, RN Phone Number: 06/15/2024, 10:32 AM  Clinical Narrative:    PT recommending pt receive STR at a SNF. NCM spoke with pt Legal Guardian Koroma over the phone at [phone] to discuss PT recommendation and he disagrees with recommendation. He states pt lives at home with him and he provides 24 hour care for the pt. He would prefer pt to return home with him and continue receiving [facility] PT/OT/RN services through [facility]. States he will provide transportation at [place]. Pt will need [facility] PT/OT/RN orders at [place]. IP Care Management is continuing to follow.     Expected Discharge Plan: Home w Home Health Services Barriers to Discharge: Continued Medical Work up               Expected Discharge Plan and Services In-house Referral: Clinical Social Work Discharge Planning Services: CM Consult Post Acute Care Choice: Home Health Living arrangements for the past 2 months: Apartment                 DME Arranged: N/A DME Agency: NA         HH Agency: [facility] Date HH Agency Contacted: 06/10/24 Time HH Agency Contacted: 1141 Representative spoke with at [facility] Agency: Arna Sayres   Social Drivers of Health (SDOH) Interventions SDOH Screenings   Food Insecurity: Patient Unable To Answer (06/10/2024)  Housing: Patient Unable To Answer (06/10/2024)  Transportation Needs: Patient Unable To Answer (06/10/2024)  Utilities: Patient Unable To Answer (06/10/2024)  Alcohol Screen: Low Risk  (11/05/2023)  Depression (PHQ2-9): Low Risk  (11/05/2023)  Financial Resource Strain: Low Risk  (11/05/2023)  Physical Activity: Inactive (11/05/2023)  Social Connections: Patient Unable To Answer (06/10/2024)  Stress: No Stress Concern Present (11/05/2023)  Tobacco Use: Medium Risk (06/09/2024)  Health Literacy:  Adequate Health Literacy (11/05/2023)    Readmission Risk Interventions    06/11/2024   10:27 AM  Readmission Risk Prevention Plan  Transportation Screening Complete  PCP or Specialist Appt within 3-5 Days Complete  HRI or Home Care Consult Complete  Social Work Consult for Recovery Care Planning/Counseling Complete  Palliative Care Screening Not Applicable  Medication Review Oceanographer) Complete

## 2024-06-16 DIAGNOSIS — N390 Urinary tract infection, site not specified: Secondary | ICD-10-CM | POA: Diagnosis not present

## 2024-06-16 LAB — CBC
HCT: 40 % (ref 39.0–52.0)
Hemoglobin: 12.6 g/dL — ABNORMAL LOW (ref 13.0–17.0)
MCH: 29.6 pg (ref 26.0–34.0)
MCHC: 31.5 g/dL (ref 30.0–36.0)
MCV: 93.9 fL (ref 80.0–100.0)
Platelets: 444 K/uL — ABNORMAL HIGH (ref 150–400)
RBC: 4.26 MIL/uL (ref 4.22–5.81)
RDW: 16.5 % — ABNORMAL HIGH (ref 11.5–15.5)
WBC: 16.3 K/uL — ABNORMAL HIGH (ref 4.0–10.5)
nRBC: 0 % (ref 0.0–0.2)

## 2024-06-16 LAB — BASIC METABOLIC PANEL WITH GFR
Anion gap: 10 (ref 5–15)
BUN: 13 mg/dL (ref 8–23)
CO2: 22 mmol/L (ref 22–32)
Calcium: 9 mg/dL (ref 8.9–10.3)
Chloride: 106 mmol/L (ref 98–111)
Creatinine, Ser: 1.06 mg/dL (ref 0.61–1.24)
GFR, Estimated: >60 mL/min (ref >60)
Glucose, Bld: 108 mg/dL — ABNORMAL HIGH (ref 70–99)
Potassium: 4.8 mmol/L (ref 3.5–5.1)
Sodium: 138 mmol/L (ref 135–145)

## 2024-06-16 NOTE — TOC Progression Note (Signed)
 Transition of Care [facility]) - Progression Note    Patient Details  Name: Jordan Kennedy MRN: [id number] Date of Birth: [birth year]  Transition of Care [facility]) CM/SW Contact  Rollins Wrightson, Nathanel, RN Phone Number: 06/16/2024, 3:55 PM  Clinical Narrative: left vm w/[place] legal guardian await call back. [facility] rep Lynette HHRN/PT/OT accepted.       Expected Discharge Plan: Home w Home Health Services Barriers to Discharge: Continued Medical Work up               Expected Discharge Plan and Services In-house Referral: Clinical Social Work Discharge Planning Services: CM Consult Post Acute Care Choice: Home Health Living arrangements for the past 2 months: Apartment                 DME Arranged: N/A DME Agency: NA         HH Agency: [facility] Date HH Agency Contacted: 06/10/24 Time HH Agency Contacted: 1141 Representative spoke with at [facility] Agency: Arna Sayres   Social Drivers of Health (SDOH) Interventions SDOH Screenings   Food Insecurity: Patient Unable To Answer (06/10/2024)  Housing: Patient Unable To Answer (06/10/2024)  Transportation Needs: Patient Unable To Answer (06/10/2024)  Utilities: Patient Unable To Answer (06/10/2024)  Alcohol Screen: Low Risk  (11/05/2023)  Depression (PHQ2-9): Low Risk  (11/05/2023)  Financial Resource Strain: Low Risk  (11/05/2023)  Physical Activity: Inactive (11/05/2023)  Social Connections: Patient Unable To Answer (06/10/2024)  Stress: No Stress Concern Present (11/05/2023)  Tobacco Use: Medium Risk (06/09/2024)  Health Literacy: Adequate Health Literacy (11/05/2023)    Readmission Risk Interventions    06/11/2024   10:27 AM  Readmission Risk Prevention Plan  Transportation Screening Complete  PCP or Specialist Appt within 3-5 Days Complete  HRI or Home Care Consult Complete  Social Work Consult for Recovery Care Planning/Counseling Complete  Palliative Care Screening Not Applicable  Medication Review Oceanographer)  Complete

## 2024-06-16 NOTE — Progress Notes (Signed)
 Physical Therapy Treatment Patient Details Name: Jordan Kennedy MRN: [id number] DOB: 01/14/1956 Today's Date: 06/16/2024   History of Present Illness Pt is 68 yo male admitted on 06/09/24 with septic shock secondary to UTI, AKI, acute metabolic encephalopathy. Pt recently admitted 05/15/24. PMH includes schizophrenia, intellectual disability, tardive dyskinesia, chronic constipation, ileus, SBO, BPH.    PT Comments   Patient more conversant, making some jokes. Patient did ambulate with improved balance using RW x 180'. Patient demonstrates  less ataxia. Per  TOC, guardian plans  for patient to return home with HHPT.    If plan is discharge home, recommend the following: A little help with walking and/or transfers;A little help with bathing/dressing/bathroom;Assistance with cooking/housework;Help with stairs or ramp for entrance   Can travel by private vehicle     Yes  Equipment Recommendations  None recommended by PT    Recommendations for Other Services       Precautions / Restrictions Precautions Precautions: Fall Recall of Precautions/Restrictions: Impaired Precaution/Restrictions Comments: foley, bowel incontinent Restrictions Weight Bearing Restrictions Per Provider Order: No     Mobility  Bed Mobility Overal bed mobility: Needs Assistance       Supine to sit: Supervision     General bed mobility comments: cues for safety, impulsive    Transfers Overall transfer level: Needs assistance Equipment used: Rolling walker (2 wheels) Transfers: Sit to/from Stand Sit to Stand: Min assist           General transfer comment: Impulsive requiring cues to wait for therapist    Ambulation/Gait Ambulation/Gait assistance: Min assist Gait Distance (Feet): 180 Feet Assistive device: Rolling walker (2 wheels) Gait Pattern/deviations: Step-through pattern, Decreased stride length, Ataxic       General Gait Details: Using RW, min A to stabilize with turns, improved  balance noted with less support required   Stairs             Wheelchair Mobility     Tilt Bed    Modified Rankin (Stroke Patients Only)       Balance Overall balance assessment: Needs assistance Sitting-balance support: No upper extremity supported, Feet supported Sitting balance-Leahy Scale: Good     Standing balance support: Bilateral upper extremity supported, During functional activity Standing balance-Leahy Scale: Poor                              Communication Communication Communication: Impaired Factors Affecting Communication: Reduced clarity of speech  Cognition Arousal: Alert Behavior During Therapy: Impulsive   PT - Cognitive impairments: History of cognitive impairments                       PT - Cognition Comments: Pt perserverating on eating and getting back to bed Following commands: Impaired Following commands impaired: Follows one step commands inconsistently    Cueing Cueing Techniques: Verbal cues, Gestural cues, Tactile cues  Exercises      General Comments        Pertinent Vitals/Pain Pain Assessment Faces Pain Scale: No hurt    Home Living                          Prior Function            PT Goals (current goals can now be found in the care plan section) Progress towards PT goals: Progressing toward goals    Frequency  PT Plan      Co-evaluation              AM-PAC PT 6 Clicks Mobility   Outcome Measure  Help needed turning from your back to your side while in a flat bed without using bedrails?: None Help needed moving from lying on your back to sitting on the side of a flat bed without using bedrails?: A Little Help needed moving to and from a bed to a chair (including a wheelchair)?: A Little Help needed standing up from a chair using your arms (e.g., wheelchair or bedside chair)?: A Little Help needed to walk in hospital room?: A Little Help needed  climbing 3-5 steps with a railing? : A Lot 6 Click Score: 18    End of Session Equipment Utilized During Treatment: Gait belt Activity Tolerance: Patient tolerated treatment well Patient left: in chair;with call bell/phone within reach Nurse Communication: Mobility status PT Visit Diagnosis: Other abnormalities of gait and mobility (R26.89);Muscle weakness (generalized) (M62.81)     Time: 9149-9077 PT Time Calculation (min) (ACUTE ONLY): 32 min  Charges:    $Gait Training: 23-37 mins PT General Charges $$ ACUTE PT VISIT: 1 Visit                     Darice Potters PT Acute Rehabilitation Services Office [phone]    Potters Darice Norris 06/16/2024, 12:47 PM

## 2024-06-16 NOTE — Progress Notes (Signed)
 PROGRESS NOTE  Jordan Kennedy  FMW:[id number] DOB: [birth year] DOA: 06/09/2024 PCP: [name] HERO, MD   Brief Narrative: Patient is a 68 year old male with history of schizophrenia, intellectual disability, HFrEF/biventricular heart failure, SBO, BPH who was recently admitted for acute renal failure secondary to bladder outlet obstruction and left hydroureteronephrosis requiring Foley catheter presented with complaint of abdominal pain, worsening confusion, low blood pressure.  On presentation, he was hypotensive with systolic blood pressure in the range of 80s.  Lab work showed WC count of 20.6, creatinine of 4.  UA suggestive of UTI.  CT head did not show any acute intracranial abnormalities.  CT abdomen/pelvis showed bilateral mild hydroureteronephrosis in the setting of fully decompressed urinary bladder lumen with associated mild urothelial thickening.  He was febrile on presentation.  Admitted for further management of septic shock likely from UTI and also AKI.  Rebound hypertensive with systolic blood pressure in the range of 70s critical care consulted and transferred to ICU on 8/20, started on pressors.  Currently off pressors, blood pressure improving.  Mental status is baseline.  PT recommending SNF on discharge.  His legal guardian wants to bring back home with home health.  Plan for discharge tomorrow if blood pressure remains stable  Assessment & Plan:  Principal Problem:   UTI (urinary tract infection) Active Problems:   AKI (acute kidney injury) (HCC)   Chronic HFrEF (heart failure with reduced ejection fraction) (HCC)   Acute metabolic encephalopathy   Hyponatremia   Septic shock secondary to UTI: Presented with fever, tachycardia, leukocytosis. UA suspicious for UTI.  Started on broad spectrum antibiotics.  Continue midodrine  at current dose.  Off pressures.   Urine culture showed E. coli and Enterobacter cloacae.  Currently on Bactrim .  Leukocytosis persist but he is afebrile.   Blood pressure still on the lower side.  AKI: Likely from prerenal etiology, sepsis, hypotension.  Baseline creatinine ranges from 0.8-1.2.  Presented with creatinine in the range of 4. CT showing bilateral mild hydroureteronephrosis in the setting of a fully decompressed urinary bladder lumen with associated mild urothelial thickening.  Kidney function back to baseline  Acute metabolic encephalopathy: Most likely from sepsis.  CT head did not show any acute findings.  Ammonia level normal.  He is mostly alert and oriented    Chronic HFrEF/biventricular heart failure: Echo done on July 2 and 24 showed EF of less than 20%, grade 1 diastolic function, moderately reduced right ventricular function.  Appeared dehydrated on presentation.  Given fluids, now is stopped.  Will resume his home CHF medications when his blood pressure allows   prolonged QTc: QTc of 561 on presentation.  Monitor on telemetry. Improved to 551.  Continue monitoring   History of chronic hypotension: Continue midodrine   History of bladder outlet obstruction: During his last hospitalization, Foley was placed.  Given  a voiding trial on 8/23 and he failed.  Foley placed back.  Needs to follow-up with urology as an outpatient.  Continue Flomax    BPH/schizophrenia: Home medications restarted   Debility/deconditioning: Patient from group home.  PT recommending SNF on discharge.  Legal guardian declined SNF          DVT prophylaxis:heparin  injection 5,000 Units Start: 06/10/24 0600     Code Status: Full Code  Family Communication: Called legal guardian Ibrahim Koroma  on 8/26,call not received  Patient status:Inpatient  Patient is from :Group home  Anticipated discharge to: Group home versus SNF  Estimated DC date: Tomorrow   Consultants: PCCM  Procedures:None  Antimicrobials:  Anti-infectives (From admission, onward)    Start     Dose/Rate Route Frequency Ordered Stop   06/12/24 1015   sulfamethoxazole -trimethoprim  (BACTRIM  DS) 800-160 MG per tablet 1 tablet        1 tablet Oral Every 12 hours 06/12/24 0915 06/17/24 0959   06/10/24 2300  cefTRIAXone  (ROCEPHIN ) 1 g in sodium chloride  0.9 % 100 mL IVPB  Status:  Discontinued        1 g 200 mL/hr over 30 Minutes Intravenous Every 24 hours 06/10/24 0034 06/10/24 0815   06/10/24 2300  cefTRIAXone  (ROCEPHIN ) 2 g in sodium chloride  0.9 % 100 mL IVPB  Status:  Discontinued        2 g 200 mL/hr over 30 Minutes Intravenous Every 24 hours 06/10/24 0815 06/12/24 0902   06/09/24 2300  cefTRIAXone  (ROCEPHIN ) 1 g in sodium chloride  0.9 % 100 mL IVPB        1 g 200 mL/hr over 30 Minutes Intravenous  Once 06/09/24 2259 06/09/24 2335       Subjective: Patient seen and examined at the bedside today.  Blood pressure better than yesterday.  But is still soft.  Denies any new complaints.  Foley in place.  Mostly oriented.  Denies abdomen pain, he says diarrhea has slowed down.  He does not feel ready to go to group home today.  We discussed that if his blood pressure remains stable, we will plan to discharge him tomorrow  Objective: Vitals:   06/15/24 1738 06/15/24 2058 06/16/24 0136 06/16/24 0528  BP: 91/65 103/76 105/75 109/80  Pulse: (!) 101 (!) 102 96 98  Resp: 16 18 18 15   Temp: 99.5 F (37.5 C) 98.3 F (36.8 C) 98.1 F (36.7 C) 98.2 F (36.8 C)  TempSrc: Oral Oral Oral Oral  SpO2: 97% 98% 99% 98%  Weight:      Height:        Intake/Output Summary (Last 24 hours) at 06/16/2024 1029 Last data filed at 06/16/2024 0900 Gross per 24 hour  Intake 820 ml  Output 2750 ml  Net -1930 ml   Filed Weights   06/11/24 0543 06/13/24 0341 06/14/24 0335  Weight: 87.4 kg 82.5 kg 79.1 kg    Examination:   General exam: Overall comfortable, not in distress HEENT: PERRL Respiratory system:  no wheezes or crackles  Cardiovascular system: S1 & S2 heard, RRR.  Gastrointestinal system: Abdomen is nondistended, soft and  nontender. Central nervous system: Alert and oriented Extremities: No edema, no clubbing ,no cyanosis Skin: No rashes, no ulcers,no icterus   GU: Foley   Data Reviewed: I have personally reviewed following labs and imaging studies  CBC: Recent Labs  Lab 06/09/24 2101 06/10/24 0331 06/12/24 0303 06/13/24 0243 06/14/24 0242 06/15/24 0244 06/16/24 0502  WBC 20.6*   < > 14.0* 17.9* 20.0* 16.2* 16.3*  NEUTROABS 17.0*  --   --   --   --   --   --   HGB 11.0*   < > 11.5* 11.8* 11.4* 11.5* 12.6*  HCT 34.5*   < > 37.0* 38.3* 36.3* 35.9* 40.0  MCV 94.3   < > 95.1 96.2 95.5 93.7 93.9  PLT 342   < > 343 304 357 381 444*   < > = values in this interval not displayed.   Basic Metabolic Panel: Recent Labs  Lab 06/10/24 0331 06/11/24 0302 06/12/24 0303 06/13/24 0243 06/14/24 0242 06/16/24 0502  NA 134* 133* 134* 138 138 138  K 4.2  4.0 4.3 4.4 4.5 4.8  CL 105 103 104 107 107 106  CO2 19* 18* 19* 24 22 22   GLUCOSE 120* 128* 103* 103* 105* 108*  BUN 43* 32* 21 18 15 13   CREATININE 3.11* 1.72* 1.24 1.16 0.83 1.06  CALCIUM  8.0* 8.1* 7.9* 8.2* 8.4* 9.0  MG 1.9 2.2 2.0 1.9  --   --   PHOS  --  3.5 3.1 3.1  --   --      Recent Results (from the past 240 hours)  Resp panel by RT-PCR (RSV, Flu A&B, Covid) Anterior Nasal Swab     Status: None   Collection Time: 06/09/24  9:08 PM   Specimen: Anterior Nasal Swab  Result Value Ref Range Status   SARS Coronavirus 2 by RT PCR NEGATIVE NEGATIVE Final    Comment: (NOTE) SARS-CoV-2 target nucleic acids are NOT DETECTED.  The SARS-CoV-2 RNA is generally detectable in upper respiratory specimens during the acute phase of infection. The lowest concentration of SARS-CoV-2 viral copies this assay can detect is 138 copies/mL. A negative result does not preclude SARS-Cov-2 infection and should not be used as the sole basis for treatment or other patient management decisions. A negative result may occur with  improper specimen  collection/handling, submission of specimen other than nasopharyngeal swab, presence of viral mutation(s) within the areas targeted by this assay, and inadequate number of viral copies(<138 copies/mL). A negative result must be combined with clinical observations, patient history, and epidemiological information. The expected result is Negative.  Fact Sheet for Patients:  BloggerCourse.com  Fact Sheet for Healthcare Providers:  SeriousBroker.it  This test is no t yet approved or cleared by the [place]  FDA and  has been authorized for detection and/or diagnosis of SARS-CoV-2 by FDA under an Emergency Use Authorization (EUA). This EUA will remain  in effect (meaning this test can be used) for the duration of the COVID-19 declaration under Section 564(b)(1) of the Act, 21 U.S.C.section 360bbb-3(b)(1), unless the authorization is terminated  or revoked sooner.       Influenza A by PCR NEGATIVE NEGATIVE Final   Influenza B by PCR NEGATIVE NEGATIVE Final    Comment: (NOTE) The Xpert Xpress SARS-CoV-2/FLU/RSV plus assay is intended as an aid in the diagnosis of influenza from Nasopharyngeal swab specimens and should not be used as a sole basis for treatment. Nasal washings and aspirates are unacceptable for Xpert Xpress SARS-CoV-2/FLU/RSV testing.  Fact Sheet for Patients: BloggerCourse.com  Fact Sheet for Healthcare Providers: SeriousBroker.it  This test is not yet approved or cleared by the [place]  FDA and has been authorized for detection and/or diagnosis of SARS-CoV-2 by FDA under an Emergency Use Authorization (EUA). This EUA will remain in effect (meaning this test can be used) for the duration of the COVID-19 declaration under Section 564(b)(1) of the Act, 21 U.S.C. section 360bbb-3(b)(1), unless the authorization is terminated or revoked.     Resp Syncytial  Virus by PCR NEGATIVE NEGATIVE Final    Comment: (NOTE) Fact Sheet for Patients: BloggerCourse.com  Fact Sheet for Healthcare Providers: SeriousBroker.it  This test is not yet approved or cleared by the [place]  FDA and has been authorized for detection and/or diagnosis of SARS-CoV-2 by FDA under an Emergency Use Authorization (EUA). This EUA will remain in effect (meaning this test can be used) for the duration of the COVID-19 declaration under Section 564(b)(1) of the Act, 21 U.S.C. section 360bbb-3(b)(1), unless the authorization is terminated or revoked.  Performed at [facility]  [facility], [address] [place], [place] [address]   Urine Culture     Status: Abnormal   Collection Time: 06/09/24  9:50 PM   Specimen: Urine, Clean Catch  Result Value Ref Range Status   Specimen Description   Final    URINE, CLEAN CATCH Performed at [facility], [address], [place], [place] [address]    Special Requests   Final    NONE Performed at [facility], [address] [place], [place] [address]    Culture (A)  Final    >=100,000 COLONIES/mL ESCHERICHIA COLI >=100,000 COLONIES/mL ENTEROBACTER CLOACAE    Report Status 06/12/2024 FINAL  Final   Organism ID, Bacteria ESCHERICHIA COLI (A)  Final   Organism ID, Bacteria ENTEROBACTER CLOACAE (A)  Final      Susceptibility   Enterobacter cloacae - MIC*    CEFEPIME  <=0.12 SENSITIVE Sensitive     ERTAPENEM <=0.12 SENSITIVE Sensitive     CIPROFLOXACIN <=0.06 SENSITIVE Sensitive     GENTAMICIN <=1 SENSITIVE Sensitive     NITROFURANTOIN 64 INTERMEDIATE Intermediate     TRIMETH /SULFA  <=20 SENSITIVE Sensitive     PIP/TAZO Value in next row Sensitive ug/mL     <=4 SENSITIVEThis is a modified FDA-approved test that has been validated and its performance characteristics determined by the reporting laboratory.  This laboratory is  certified under the Clinical Laboratory Improvement Amendments CLIA as qualified to perform high complexity clinical laboratory testing.    MEROPENEM Value in next row Sensitive      <=4 SENSITIVEThis is a modified FDA-approved test that has been validated and its performance characteristics determined by the reporting laboratory.  This laboratory is certified under the Clinical Laboratory Improvement Amendments CLIA as qualified to perform high complexity clinical laboratory testing.    * >=100,000 COLONIES/mL ENTEROBACTER CLOACAE   Escherichia coli - MIC*    AMPICILLIN Value in next row Resistant      <=4 SENSITIVEThis is a modified FDA-approved test that has been validated and its performance characteristics determined by the reporting laboratory.  This laboratory is certified under the Clinical Laboratory Improvement Amendments CLIA as qualified to perform high complexity clinical laboratory testing.    CEFAZOLIN (URINE) Value in next row Sensitive      16 SENSITIVEThis is a modified FDA-approved test that has been validated and its performance characteristics determined by the reporting laboratory.  This laboratory is certified under the Clinical Laboratory Improvement Amendments CLIA as qualified to perform high complexity clinical laboratory testing.    CEFEPIME  Value in next row Sensitive      16 SENSITIVEThis is a modified FDA-approved test that has been validated and its performance characteristics determined by the reporting laboratory.  This laboratory is certified under the Clinical Laboratory Improvement Amendments CLIA as qualified to perform high complexity clinical laboratory testing.    ERTAPENEM Value in next row Sensitive      16 SENSITIVEThis is a modified FDA-approved test that has been validated and its performance characteristics determined by the reporting laboratory.  This laboratory is certified under the Clinical Laboratory Improvement Amendments CLIA as qualified to perform  high complexity clinical laboratory testing.    CEFTRIAXONE  Value in next row Sensitive      16 SENSITIVEThis is a modified FDA-approved test that has been validated and its performance characteristics determined by the reporting laboratory.  This laboratory is certified under the Clinical Laboratory Improvement Amendments CLIA as qualified to perform high complexity clinical laboratory testing.  CIPROFLOXACIN Value in next row Sensitive      16 SENSITIVEThis is a modified FDA-approved test that has been validated and its performance characteristics determined by the reporting laboratory.  This laboratory is certified under the Clinical Laboratory Improvement Amendments CLIA as qualified to perform high complexity clinical laboratory testing.    GENTAMICIN Value in next row Sensitive      16 SENSITIVEThis is a modified FDA-approved test that has been validated and its performance characteristics determined by the reporting laboratory.  This laboratory is certified under the Clinical Laboratory Improvement Amendments CLIA as qualified to perform high complexity clinical laboratory testing.    NITROFURANTOIN Value in next row Sensitive      16 SENSITIVEThis is a modified FDA-approved test that has been validated and its performance characteristics determined by the reporting laboratory.  This laboratory is certified under the Clinical Laboratory Improvement Amendments CLIA as qualified to perform high complexity clinical laboratory testing.    TRIMETH /SULFA  Value in next row Sensitive      16 SENSITIVEThis is a modified FDA-approved test that has been validated and its performance characteristics determined by the reporting laboratory.  This laboratory is certified under the Clinical Laboratory Improvement Amendments CLIA as qualified to perform high complexity clinical laboratory testing.    AMPICILLIN/SULBACTAM Value in next row Sensitive      16 SENSITIVEThis is a modified FDA-approved test that has  been validated and its performance characteristics determined by the reporting laboratory.  This laboratory is certified under the Clinical Laboratory Improvement Amendments CLIA as qualified to perform high complexity clinical laboratory testing.    PIP/TAZO Value in next row Sensitive ug/mL     <=4 SENSITIVEThis is a modified FDA-approved test that has been validated and its performance characteristics determined by the reporting laboratory.  This laboratory is certified under the Clinical Laboratory Improvement Amendments CLIA as qualified to perform high complexity clinical laboratory testing.    MEROPENEM Value in next row Sensitive      <=4 SENSITIVEThis is a modified FDA-approved test that has been validated and its performance characteristics determined by the reporting laboratory.  This laboratory is certified under the Clinical Laboratory Improvement Amendments CLIA as qualified to perform high complexity clinical laboratory testing.    * >=100,000 COLONIES/mL ESCHERICHIA COLI  MRSA Next Gen by PCR, Nasal     Status: Abnormal   Collection Time: 06/10/24  4:04 AM   Specimen: Nasal Mucosa; Nasal Swab  Result Value Ref Range Status   MRSA by PCR Next Gen DETECTED (A) NOT DETECTED Final    Comment: RESULT CALLED TO, READ BACK BY AND VERIFIED WITH: WILDA BATTLE RN AT [phone] ON 06/10/2024 BY JM (NOTE) The GeneXpert MRSA Assay (FDA approved for NASAL specimens only), is one component of a comprehensive MRSA colonization surveillance program. It is not intended to diagnose MRSA infection nor to guide or monitor treatment for MRSA infections. Test performance is not FDA approved in patients less than 87 years old. Performed at [facility], [address], [place], [place] [address]      Radiology Studies: No results found.   Scheduled Meds:  amantadine   100 mg Oral QHS   Chlorhexidine  Gluconate Cloth  6 each Topical Daily   cloZAPine   200 mg Oral BID   vitamin B-12   1,000 mcg Oral Daily   heparin   5,000 Units Subcutaneous Q8H   linaclotide   145 mcg Oral QAC breakfast   melatonin  10 mg Oral QHS   midodrine   10 mg Oral TID WC   PARoxetine   40 mg Oral QPM   polyethylene glycol  17 g Oral Daily   senna-docusate  1 tablet Oral BID   sulfamethoxazole -trimethoprim   1 tablet Oral Q12H   tamsulosin   0.4 mg Oral QPC supper   Continuous Infusions:   LOS: 6 days   Ivonne Mustache, MD [facility] P8/26/2025, 10:29 AM

## 2024-06-17 ENCOUNTER — Other Ambulatory Visit (HOSPITAL_COMMUNITY): Payer: Self-pay

## 2024-06-17 DIAGNOSIS — N390 Urinary tract infection, site not specified: Secondary | ICD-10-CM | POA: Diagnosis not present

## 2024-06-17 LAB — CBC
HCT: 38.5 % — ABNORMAL LOW (ref 39.0–52.0)
Hemoglobin: 12.2 g/dL — ABNORMAL LOW (ref 13.0–17.0)
MCH: 29.7 pg (ref 26.0–34.0)
MCHC: 31.7 g/dL (ref 30.0–36.0)
MCV: 93.7 fL (ref 80.0–100.0)
Platelets: 457 K/uL — ABNORMAL HIGH (ref 150–400)
RBC: 4.11 MIL/uL — ABNORMAL LOW (ref 4.22–5.81)
RDW: 16.3 % — ABNORMAL HIGH (ref 11.5–15.5)
WBC: 17.9 K/uL — ABNORMAL HIGH (ref 4.0–10.5)
nRBC: 0 % (ref 0.0–0.2)

## 2024-06-17 MED ORDER — CYANOCOBALAMIN 1000 MCG PO TABS
1000.0000 ug | ORAL_TABLET | Freq: Every day | ORAL | 0 refills | Status: AC
  Filled 2024-06-17: qty 60, 60d supply, fill #0

## 2024-06-17 MED ORDER — MIDODRINE HCL 10 MG PO TABS
10.0000 mg | ORAL_TABLET | Freq: Three times a day (TID) | ORAL | 1 refills | Status: DC
  Filled 2024-06-17: qty 90, 30d supply, fill #0

## 2024-06-17 NOTE — Progress Notes (Signed)
 Discharge medications delivered to patient at bedside [facility]

## 2024-06-17 NOTE — Discharge Summary (Signed)
 Physician Discharge Summary  Jordan Kennedy FMW:[id number] DOB: 10-Dec-1955 DOA: 06/09/2024  PCP: [name] HERO, MD  Admit date: 06/09/2024 Discharge date: 06/17/2024  Admitted From: Group Home Disposition:  Group Home  Discharge Condition:Stable CODE STATUS:FULL Diet recommendation: Regular  Brief/Interim Summary: Patient is a 68 year old male with history of schizophrenia, intellectual disability, HFrEF/biventricular heart failure, SBO, BPH who was recently admitted for acute renal failure secondary to bladder outlet obstruction and left hydroureteronephrosis requiring Foley catheter presented with complaint of abdominal pain, worsening confusion, low blood pressure.  On presentation, he was hypotensive with systolic blood pressure in the range of 80s.  Lab work showed WC count of 20.6, creatinine of 4.  UA suggestive of UTI.  CT head did not show any acute intracranial abnormalities.  CT abdomen/pelvis showed bilateral mild hydroureteronephrosis in the setting of fully decompressed urinary bladder lumen with associated mild urothelial thickening.  He was febrile on presentation.  Admitted for further management of septic shock likely from UTI and also AKI.  Rebound hypertensive with systolic blood pressure in the range of 70s critical care consulted and transferred to ICU on 8/20, started on pressors.  Currently off pressors, blood pressure improving.  Mental status is baseline.  PT recommending SNF on discharge.  His legal guardian wants to bring back home with home health.  Plan for discharge today  Following problems were addressed during the hospitalization:  Septic shock secondary to UTI: Presented with fever, tachycardia, leukocytosis. UA suspicious for UTI.  Started on broad spectrum antibiotics.  Continue midodrine  at current dose.  Off pressures.   Urine culture showed E. coli and Enterobacter cloacae.Finished the course of  Bactrim .  Leukocytosis persist but he is afebrile.  Upon  reviewing his previous labs, he has chronic leukocytosis.  Blood pressure soft but stable.   AKI: Likely from prerenal etiology, sepsis, hypotension.  Baseline creatinine ranges from 0.8-1.2.  Presented with creatinine in the range of 4. CT showing bilateral mild hydroureteronephrosis in the setting of a fully decompressed urinary bladder lumen with associated mild urothelial thickening.  Kidney function back to baseline   Acute metabolic encephalopathy: Most likely from sepsis.  CT head did not show any acute findings.  Ammonia level normal.  He is mostly alert and oriented now   Chronic HFrEF/biventricular heart failure: Echo done on July 2024 showed EF of less than 20%, grade 1 diastolic function, moderately reduced right ventricular function.  Appeared dehydrated on presentation.  Given fluids, now is stopped. Needs to resume his home CHF medications when his blood pressure allows .  We recommend to follow-up with his  cardiologist as soon as  possible.   prolonged QTc: QTc of 561 on presentation.  Monitor on telemetry. Improved to 544.  Continue monitoring as an outpatient   History of chronic hypotension: Continue midodrine    History of bladder outlet obstruction: During his last hospitalization, Foley was placed.  Given  a voiding trial on 8/23 and he failed.  Foley placed back.  Needs to follow-up with urology as an outpatient.  Continue Flomax     BPH/schizophrenia: Home medications restarted    Debility/deconditioning: Patient from group home.  PT recommending SNF on discharge.  Legal guardian declined SNF       Discharge Diagnoses:  Principal Problem:   UTI (urinary tract infection) Active Problems:   AKI (acute kidney injury) (HCC)   Chronic HFrEF (heart failure with reduced ejection fraction) (HCC)   Acute metabolic encephalopathy   Hyponatremia    Discharge Instructions  Discharge Instructions     Diet general   Complete by: As directed    Discharge instructions    Complete by: As directed    1)Please take your medications as instructed 2)Follow up with your PCP in a week. 3)Follow up with home health services 4)Continue Foley care at home.  Follow-up with urology as an outpatient in 1 to 2 weeks.  Name and number of the  provider has been attached   Increase activity slowly   Complete by: As directed       Allergies as of 06/17/2024   No Known Allergies      Medication List     STOP taking these medications    Entresto  24-26 MG Generic drug: sacubitril -valsartan    furosemide  40 MG tablet Commonly known as: LASIX    metoCLOPramide  5 MG tablet Commonly known as: REGLAN    metoprolol  succinate 25 MG 24 hr tablet Commonly known as: TOPROL -XL   Myrbetriq  50 MG Tb24 tablet Generic drug: mirabegron  ER   polyethylene glycol powder 17 GM/SCOOP powder Commonly known as: GLYCOLAX /MIRALAX    Senna Plus 8.6-50 MG tablet Generic drug: senna-docusate   zolpidem  10 MG tablet Commonly known as: AMBIEN        TAKE these medications    amantadine  100 MG capsule Commonly known as: SYMMETREL  Take 100 mg by mouth at bedtime.   Aspirin  EC Adult Low Dose 81 MG tablet Generic drug: aspirin  EC TAKE 1 TABLET BY MOUTH EVERY DAY   cloZAPine  100 MG tablet Commonly known as: CLOZARIL  Take 2 tablets (200 mg total) by mouth 2 (two) times daily.   cyanocobalamin  1000 MCG tablet Take 1 tablet (1,000 mcg total) by mouth daily. Start taking on: June 18, 2024   Farxiga  10 MG Tabs tablet Generic drug: dapagliflozin  propanediol TAKE 1 TABLET BY MOUTH EVERY DAY   finasteride  5 MG tablet Commonly known as: PROSCAR  Take 1 tablet (5 mg total) by mouth daily.   Linzess  290 MCG Caps capsule Generic drug: linaclotide  TAKE 1 CAPSULE BY MOUTH EVERY MORNING BEFORE BREAKFAST What changed: See the new instructions.   Melatonin 10 MG Subl Place 10 mg under the tongue at bedtime.   midodrine  10 MG tablet Commonly known as: PROAMATINE  Take 1 tablet  (10 mg total) by mouth 3 (three) times daily with meals. What changed:  medication strength how much to take when to take this   omeprazole  40 MG capsule Commonly known as: PRILOSEC TAKE 1 CAPSULE BY MOUTH EVERY EVENING   PARoxetine  40 MG tablet Commonly known as: PAXIL  Take 40 mg by mouth every evening.   tamsulosin  0.4 MG Caps capsule Commonly known as: FLOMAX  Take 0.4 mg by mouth daily after supper.        Follow-up Information     [name] HERO, MD. Schedule an appointment as soon as possible for a visit in 1 week(s).   Specialty: Family Medicine Contact information: [address] [place] [address] [phone]         [facility]. Schedule an appointment as soon as possible for a visit in 1 week(s).   Contact information: [address] [place] [address] [place]  [address] [phone]               No Known Allergies  Consultations: PCCM   Procedures/Studies: DG CHEST PORT 1 VIEW Result Date: 06/12/2024 CLINICAL DATA:  Shortness of breath.  Urinary tract infection. EXAM: PORTABLE CHEST 1 VIEW COMPARISON:  05/15/2024 FINDINGS: Chronic cardiomegaly. Improvement in previously  seen interstitial and alveolar edema. Lungs are clear today except for minimal volume loss at the left base. IMPRESSION: Improvement in previously seen interstitial and alveolar edema. Minimal volume loss at the left base. Electronically Signed   By: Oneil Officer M.D.   On: 06/12/2024 09:26   CT ABDOMEN PELVIS WO CONTRAST Result Date: 06/09/2024 CLINICAL DATA:  abd distension abdominal pain x 1 day. Per report patient worsening confusion tonight. Per EMS initial BP was 80/50. LR Given by EMS. Patient report nausea, denies vomiting. EXAM: CT ABDOMEN AND PELVIS WITHOUT CONTRAST TECHNIQUE: Multidetector CT imaging of the abdomen and pelvis was performed following the standard protocol without IV contrast. RADIATION DOSE REDUCTION: This exam was  performed according to the departmental dose-optimization program which includes automated exposure control, adjustment of the mA and/or kV according to patient size and/or use of iterative reconstruction technique. COMPARISON:  None Available. FINDINGS: Lower chest: Diffuse bronchial wall thickening. Bilateral trace, right greater than left, pleural effusions. Cardiomegaly. Hepatobiliary: No focal liver abnormality. No gallstones, gallbladder wall thickening, or pericholecystic fluid. No biliary dilatation. Pancreas: No focal lesion. Normal pancreatic contour. No surrounding inflammatory changes. No main pancreatic ductal dilatation. Spleen: Normal in size without focal abnormality. Adrenals/Urinary Tract: No adrenal nodule bilaterally. Bilateral mild hydroureteronephrosis. Associated mild urothelial thickening. No nephroureterolithiasis bilaterally. The urinary bladder is fully decompressed with Foley tip and inflated balloon terminating within the urinary bladder lumen. Stomach/Bowel: Stomach is within normal limits. No evidence of bowel wall thickening or dilatation. Appendix appears normal. Vascular/Lymphatic: No abdominal aorta or iliac aneurysm. Mild atherosclerotic plaque of the aorta and its branches. No abdominal, pelvic, or inguinal lymphadenopathy. Reproductive: The prostate is enlarged measuring up to 5.7 cm. Other: No intraperitoneal free fluid. No intraperitoneal free gas. No organized fluid collection. Musculoskeletal: No abdominal wall hernia or abnormality. Diffuse subcutaneus soft tissue edema. No suspicious lytic or blastic osseous lesions. No acute displaced fracture. IMPRESSION: 1. Bilateral mild hydroureteronephrosis in the setting of a fully decompressed urinary bladder lumen. Associated mild urothelial thickening. This may reflect chronic changes of obstructive uropathy or reflux. Correlate with analysis for possible superimposed infection. 2. Cardiomegaly. 3. Bilateral trace, right greater  than left, pleural effusions. 4. Subcutaneus soft tissue edema. 5.  Aortic Atherosclerosis (ICD10-I70.0). Electronically Signed   By: Morgane  Naveau M.D.   On: 06/09/2024 22:43   CT Head Wo Contrast Result Date: 06/09/2024 CLINICAL DATA:  Mental status change EXAM: CT HEAD WITHOUT CONTRAST TECHNIQUE: Contiguous axial images were obtained from the base of the skull through the vertex without intravenous contrast. RADIATION DOSE REDUCTION: This exam was performed according to the departmental dose-optimization program which includes automated exposure control, adjustment of the mA and/or kV according to patient size and/or use of iterative reconstruction technique. COMPARISON:  CT head 11/18/2009 FINDINGS: Brain: No intracranial hemorrhage, mass effect, or evidence of acute infarct. No hydrocephalus. No extra-axial fluid collection. Vascular: No hyperdense vessel or unexpected calcification. Skull: No fracture or focal lesion. Sinuses/Orbits: No acute finding. Other: None. IMPRESSION: No acute intracranial abnormality. Electronically Signed   By: Norman Gatlin M.D.   On: 06/09/2024 22:28      Subjective: Patient seen and examined at bedside today.  Hemodynamically stable.  His blood pressure has been soft but he is asymptomatic.  No tachycardia.  He is mostly alert and oriented.  Looks very comfortable this morning.  Medically stable for discharge.I called his legal guardian Sherleen Ngo to update about discharge planning,call not received  Discharge Exam: Vitals:  06/16/24 2004 06/17/24 0638  BP: 103/74 (!) 91/57  Pulse: (!) 105 96  Resp: 18 18  Temp: 98.3 F (36.8 C) 98.5 F (36.9 C)  SpO2: 98% 99%   Vitals:   06/16/24 0528 06/16/24 1209 06/16/24 2004 06/17/24 0638  BP: 109/80 100/76 103/74 (!) 91/57  Pulse: 98 (!) 109 (!) 105 96  Resp: 15  18 18   Temp: 98.2 F (36.8 C) 98.6 F (37 C) 98.3 F (36.8 C) 98.5 F (36.9 C)  TempSrc: Oral Oral Oral Oral  SpO2: 98% 100% 98% 99%   Weight:      Height:        General: Pt is alert, awake, not in acute distress Cardiovascular: RRR, S1/S2 +, no rubs, no gallops Respiratory: CTA bilaterally, no wheezing, no rhonchi Abdominal: Soft, NT, ND, bowel sounds + Extremities: no edema, no cyanosis    The results of significant diagnostics from this hospitalization (including imaging, microbiology, ancillary and laboratory) are listed below for reference.     Microbiology: Recent Results (from the past 240 hours)  Resp panel by RT-PCR (RSV, Flu A&B, Covid) Anterior Nasal Swab     Status: None   Collection Time: 06/09/24  9:08 PM   Specimen: Anterior Nasal Swab  Result Value Ref Range Status   SARS Coronavirus 2 by RT PCR NEGATIVE NEGATIVE Final    Comment: (NOTE) SARS-CoV-2 target nucleic acids are NOT DETECTED.  The SARS-CoV-2 RNA is generally detectable in upper respiratory specimens during the acute phase of infection. The lowest concentration of SARS-CoV-2 viral copies this assay can detect is 138 copies/mL. A negative result does not preclude SARS-Cov-2 infection and should not be used as the sole basis for treatment or other patient management decisions. A negative result may occur with  improper specimen collection/handling, submission of specimen other than nasopharyngeal swab, presence of viral mutation(s) within the areas targeted by this assay, and inadequate number of viral copies(<138 copies/mL). A negative result must be combined with clinical observations, patient history, and epidemiological information. The expected result is Negative.  Fact Sheet for Patients:  BloggerCourse.com  Fact Sheet for Healthcare Providers:  SeriousBroker.it  This test is no t yet approved or cleared by the [place]  FDA and  has been authorized for detection and/or diagnosis of SARS-CoV-2 by FDA under an Emergency Use Authorization (EUA). This EUA will remain  in  effect (meaning this test can be used) for the duration of the COVID-19 declaration under Section 564(b)(1) of the Act, 21 U.S.C.section 360bbb-3(b)(1), unless the authorization is terminated  or revoked sooner.       Influenza A by PCR NEGATIVE NEGATIVE Final   Influenza B by PCR NEGATIVE NEGATIVE Final    Comment: (NOTE) The Xpert Xpress SARS-CoV-2/FLU/RSV plus assay is intended as an aid in the diagnosis of influenza from Nasopharyngeal swab specimens and should not be used as a sole basis for treatment. Nasal washings and aspirates are unacceptable for Xpert Xpress SARS-CoV-2/FLU/RSV testing.  Fact Sheet for Patients: BloggerCourse.com  Fact Sheet for Healthcare Providers: SeriousBroker.it  This test is not yet approved or cleared by the [place]  FDA and has been authorized for detection and/or diagnosis of SARS-CoV-2 by FDA under an Emergency Use Authorization (EUA). This EUA will remain in effect (meaning this test can be used) for the duration of the COVID-19 declaration under Section 564(b)(1) of the Act, 21 U.S.C. section 360bbb-3(b)(1), unless the authorization is terminated or revoked.     Resp Syncytial Virus by PCR NEGATIVE  NEGATIVE Final    Comment: (NOTE) Fact Sheet for Patients: BloggerCourse.com  Fact Sheet for Healthcare Providers: SeriousBroker.it  This test is not yet approved or cleared by the [place]  FDA and has been authorized for detection and/or diagnosis of SARS-CoV-2 by FDA under an Emergency Use Authorization (EUA). This EUA will remain in effect (meaning this test can be used) for the duration of the COVID-19 declaration under Section 564(b)(1) of the Act, 21 U.S.C. section 360bbb-3(b)(1), unless the authorization is terminated or revoked.  Performed at [facility], [address], [place], [place] [address]    Urine Culture     Status: Abnormal   Collection Time: 06/09/24  9:50 PM   Specimen: Urine, Clean Catch  Result Value Ref Range Status   Specimen Description   Final    URINE, CLEAN CATCH Performed at [facility], [address], [place], [place] [address]    Special Requests   Final    NONE Performed at [facility], [address] [place], [place] [address]    Culture (A)  Final    >=100,000 COLONIES/mL ESCHERICHIA COLI >=100,000 COLONIES/mL ENTEROBACTER CLOACAE    Report Status 06/12/2024 FINAL  Final   Organism ID, Bacteria ESCHERICHIA COLI (A)  Final   Organism ID, Bacteria ENTEROBACTER CLOACAE (A)  Final      Susceptibility   Enterobacter cloacae - MIC*    CEFEPIME  <=0.12 SENSITIVE Sensitive     ERTAPENEM <=0.12 SENSITIVE Sensitive     CIPROFLOXACIN <=0.06 SENSITIVE Sensitive     GENTAMICIN <=1 SENSITIVE Sensitive     NITROFURANTOIN 64 INTERMEDIATE Intermediate     TRIMETH /SULFA  <=20 SENSITIVE Sensitive     PIP/TAZO Value in next row Sensitive ug/mL     <=4 SENSITIVEThis is a modified FDA-approved test that has been validated and its performance characteristics determined by the reporting laboratory.  This laboratory is certified under the Clinical Laboratory Improvement Amendments CLIA as qualified to perform high complexity clinical laboratory testing.    MEROPENEM Value in next row Sensitive      <=4 SENSITIVEThis is a modified FDA-approved test that has been validated and its performance characteristics determined by the reporting laboratory.  This laboratory is certified under the Clinical Laboratory Improvement Amendments CLIA as qualified to perform high complexity clinical laboratory testing.    * >=100,000 COLONIES/mL ENTEROBACTER CLOACAE   Escherichia coli - MIC*    AMPICILLIN Value in next row Resistant      <=4 SENSITIVEThis is a modified FDA-approved test that has been validated and its performance characteristics  determined by the reporting laboratory.  This laboratory is certified under the Clinical Laboratory Improvement Amendments CLIA as qualified to perform high complexity clinical laboratory testing.    CEFAZOLIN (URINE) Value in next row Sensitive      16 SENSITIVEThis is a modified FDA-approved test that has been validated and its performance characteristics determined by the reporting laboratory.  This laboratory is certified under the Clinical Laboratory Improvement Amendments CLIA as qualified to perform high complexity clinical laboratory testing.    CEFEPIME  Value in next row Sensitive      16 SENSITIVEThis is a modified FDA-approved test that has been validated and its performance characteristics determined by the reporting laboratory.  This laboratory is certified under the Clinical Laboratory Improvement Amendments CLIA as qualified to perform high complexity clinical laboratory testing.    ERTAPENEM Value in next row Sensitive      16 SENSITIVEThis is a modified  FDA-approved test that has been validated and its performance characteristics determined by the reporting laboratory.  This laboratory is certified under the Clinical Laboratory Improvement Amendments CLIA as qualified to perform high complexity clinical laboratory testing.    CEFTRIAXONE  Value in next row Sensitive      16 SENSITIVEThis is a modified FDA-approved test that has been validated and its performance characteristics determined by the reporting laboratory.  This laboratory is certified under the Clinical Laboratory Improvement Amendments CLIA as qualified to perform high complexity clinical laboratory testing.    CIPROFLOXACIN Value in next row Sensitive      16 SENSITIVEThis is a modified FDA-approved test that has been validated and its performance characteristics determined by the reporting laboratory.  This laboratory is certified under the Clinical Laboratory Improvement Amendments CLIA as qualified to perform high  complexity clinical laboratory testing.    GENTAMICIN Value in next row Sensitive      16 SENSITIVEThis is a modified FDA-approved test that has been validated and its performance characteristics determined by the reporting laboratory.  This laboratory is certified under the Clinical Laboratory Improvement Amendments CLIA as qualified to perform high complexity clinical laboratory testing.    NITROFURANTOIN Value in next row Sensitive      16 SENSITIVEThis is a modified FDA-approved test that has been validated and its performance characteristics determined by the reporting laboratory.  This laboratory is certified under the Clinical Laboratory Improvement Amendments CLIA as qualified to perform high complexity clinical laboratory testing.    TRIMETH /SULFA  Value in next row Sensitive      16 SENSITIVEThis is a modified FDA-approved test that has been validated and its performance characteristics determined by the reporting laboratory.  This laboratory is certified under the Clinical Laboratory Improvement Amendments CLIA as qualified to perform high complexity clinical laboratory testing.    AMPICILLIN/SULBACTAM Value in next row Sensitive      16 SENSITIVEThis is a modified FDA-approved test that has been validated and its performance characteristics determined by the reporting laboratory.  This laboratory is certified under the Clinical Laboratory Improvement Amendments CLIA as qualified to perform high complexity clinical laboratory testing.    PIP/TAZO Value in next row Sensitive ug/mL     <=4 SENSITIVEThis is a modified FDA-approved test that has been validated and its performance characteristics determined by the reporting laboratory.  This laboratory is certified under the Clinical Laboratory Improvement Amendments CLIA as qualified to perform high complexity clinical laboratory testing.    MEROPENEM Value in next row Sensitive      <=4 SENSITIVEThis is a modified FDA-approved test that has been  validated and its performance characteristics determined by the reporting laboratory.  This laboratory is certified under the Clinical Laboratory Improvement Amendments CLIA as qualified to perform high complexity clinical laboratory testing.    * >=100,000 COLONIES/mL ESCHERICHIA COLI  MRSA Next Gen by PCR, Nasal     Status: Abnormal   Collection Time: 06/10/24  4:04 AM   Specimen: Nasal Mucosa; Nasal Swab  Result Value Ref Range Status   MRSA by PCR Next Gen DETECTED (A) NOT DETECTED Final    Comment: RESULT CALLED TO, READ BACK BY AND VERIFIED WITH: WILDA BATTLE RN AT [phone] ON 06/10/2024 BY JM (NOTE) The GeneXpert MRSA Assay (FDA approved for NASAL specimens only), is one component of a comprehensive MRSA colonization surveillance program. It is not intended to diagnose MRSA infection nor to guide or monitor treatment for MRSA infections. Test performance is not FDA approved in patients  less than 74 years old. Performed at [facility], [address] [place], [place] [address]      Labs: BNP (last 3 results) Recent Labs    06/26/23 1023 06/10/24 0331 06/13/24 0243  BNP 290.0* 2,208.7* 903.0*   Basic Metabolic Panel: Recent Labs  Lab 06/11/24 0302 06/12/24 0303 06/13/24 0243 06/14/24 0242 06/16/24 0502  NA 133* 134* 138 138 138  K 4.0 4.3 4.4 4.5 4.8  CL 103 104 107 107 106  CO2 18* 19* 24 22 22   GLUCOSE 128* 103* 103* 105* 108*  BUN 32* 21 18 15 13   CREATININE 1.72* 1.24 1.16 0.83 1.06  CALCIUM  8.1* 7.9* 8.2* 8.4* 9.0  MG 2.2 2.0 1.9  --   --   PHOS 3.5 3.1 3.1  --   --    Liver Function Tests: Recent Labs  Lab 06/11/24 0302  AST 62*  ALT 43  ALKPHOS 148*  BILITOT 0.6  PROT 6.3*  ALBUMIN 2.3*   No results for input(s): LIPASE, AMYLASE in the last 168 hours. No results for input(s): AMMONIA in the last 168 hours. CBC: Recent Labs  Lab 06/13/24 0243 06/14/24 0242 06/15/24 0244 06/16/24 0502 06/17/24 0522  WBC 17.9* 20.0*  16.2* 16.3* 17.9*  HGB 11.8* 11.4* 11.5* 12.6* 12.2*  HCT 38.3* 36.3* 35.9* 40.0 38.5*  MCV 96.2 95.5 93.7 93.9 93.7  PLT 304 357 381 444* 457*   Cardiac Enzymes: No results for input(s): CKTOTAL, CKMB, CKMBINDEX, TROPONINI in the last 168 hours. BNP: Invalid input(s): POCBNP CBG: No results for input(s): GLUCAP in the last 168 hours. D-Dimer No results for input(s): DDIMER in the last 72 hours. Hgb A1c No results for input(s): HGBA1C in the last 72 hours. Lipid Profile No results for input(s): CHOL, HDL, LDLCALC, TRIG, CHOLHDL, LDLDIRECT in the last 72 hours. Thyroid function studies No results for input(s): TSH, T4TOTAL, T3FREE, THYROIDAB in the last 72 hours.  Invalid input(s): FREET3 Anemia work up No results for input(s): VITAMINB12, FOLATE, FERRITIN, TIBC, IRON, RETICCTPCT in the last 72 hours. Urinalysis    Component Value Date/Time   COLORURINE AMBER (A) 06/09/2024 2150   APPEARANCEUR TURBID (A) 06/09/2024 2150   LABSPEC 1.015 06/09/2024 2150   PHURINE 5.0 06/09/2024 2150   GLUCOSEU >=500 (A) 06/09/2024 2150   HGBUR LARGE (A) 06/09/2024 2150   BILIRUBINUR NEGATIVE 06/09/2024 2150   BILIRUBINUR negative 11/25/2023 1027   BILIRUBINUR Negative 01/31/2015 1010   KETONESUR NEGATIVE 06/09/2024 2150   PROTEINUR >=300 (A) 06/09/2024 2150   UROBILINOGEN 1.0 11/25/2023 1027   UROBILINOGEN 1.0 06/12/2012 1315   NITRITE NEGATIVE 06/09/2024 2150   LEUKOCYTESUR MODERATE (A) 06/09/2024 2150   Sepsis Labs Recent Labs  Lab 06/14/24 0242 06/15/24 0244 06/16/24 0502 06/17/24 0522  WBC 20.0* 16.2* 16.3* 17.9*   Microbiology Recent Results (from the past 240 hours)  Resp panel by RT-PCR (RSV, Flu A&B, Covid) Anterior Nasal Swab     Status: None   Collection Time: 06/09/24  9:08 PM   Specimen: Anterior Nasal Swab  Result Value Ref Range Status   SARS Coronavirus 2 by RT PCR NEGATIVE NEGATIVE Final    Comment:  (NOTE) SARS-CoV-2 target nucleic acids are NOT DETECTED.  The SARS-CoV-2 RNA is generally detectable in upper respiratory specimens during the acute phase of infection. The lowest concentration of SARS-CoV-2 viral copies this assay can detect is 138 copies/mL. A negative result does not preclude SARS-Cov-2 infection and should not be used as the sole basis for  treatment or other patient management decisions. A negative result may occur with  improper specimen collection/handling, submission of specimen other than nasopharyngeal swab, presence of viral mutation(s) within the areas targeted by this assay, and inadequate number of viral copies(<138 copies/mL). A negative result must be combined with clinical observations, patient history, and epidemiological information. The expected result is Negative.  Fact Sheet for Patients:  BloggerCourse.com  Fact Sheet for Healthcare Providers:  SeriousBroker.it  This test is no t yet approved or cleared by the [place]  FDA and  has been authorized for detection and/or diagnosis of SARS-CoV-2 by FDA under an Emergency Use Authorization (EUA). This EUA will remain  in effect (meaning this test can be used) for the duration of the COVID-19 declaration under Section 564(b)(1) of the Act, 21 U.S.C.section 360bbb-3(b)(1), unless the authorization is terminated  or revoked sooner.       Influenza A by PCR NEGATIVE NEGATIVE Final   Influenza B by PCR NEGATIVE NEGATIVE Final    Comment: (NOTE) The Xpert Xpress SARS-CoV-2/FLU/RSV plus assay is intended as an aid in the diagnosis of influenza from Nasopharyngeal swab specimens and should not be used as a sole basis for treatment. Nasal washings and aspirates are unacceptable for Xpert Xpress SARS-CoV-2/FLU/RSV testing.  Fact Sheet for Patients: BloggerCourse.com  Fact Sheet for Healthcare  Providers: SeriousBroker.it  This test is not yet approved or cleared by the [place]  FDA and has been authorized for detection and/or diagnosis of SARS-CoV-2 by FDA under an Emergency Use Authorization (EUA). This EUA will remain in effect (meaning this test can be used) for the duration of the COVID-19 declaration under Section 564(b)(1) of the Act, 21 U.S.C. section 360bbb-3(b)(1), unless the authorization is terminated or revoked.     Resp Syncytial Virus by PCR NEGATIVE NEGATIVE Final    Comment: (NOTE) Fact Sheet for Patients: BloggerCourse.com  Fact Sheet for Healthcare Providers: SeriousBroker.it  This test is not yet approved or cleared by the [place]  FDA and has been authorized for detection and/or diagnosis of SARS-CoV-2 by FDA under an Emergency Use Authorization (EUA). This EUA will remain in effect (meaning this test can be used) for the duration of the COVID-19 declaration under Section 564(b)(1) of the Act, 21 U.S.C. section 360bbb-3(b)(1), unless the authorization is terminated or revoked.  Performed at [facility], [address], [place], [place] [address]   Urine Culture     Status: Abnormal   Collection Time: 06/09/24  9:50 PM   Specimen: Urine, Clean Catch  Result Value Ref Range Status   Specimen Description   Final    URINE, CLEAN CATCH Performed at [facility], [address], [place], [place] [address]    Special Requests   Final    NONE Performed at [facility], [address], [place], [place] [address]    Culture (A)  Final    >=100,000 COLONIES/mL ESCHERICHIA COLI >=100,000 COLONIES/mL ENTEROBACTER CLOACAE    Report Status 06/12/2024 FINAL  Final   Organism ID, Bacteria ESCHERICHIA COLI (A)  Final   Organism ID, Bacteria ENTEROBACTER CLOACAE (A)  Final      Susceptibility   Enterobacter cloacae  - MIC*    CEFEPIME  <=0.12 SENSITIVE Sensitive     ERTAPENEM <=0.12 SENSITIVE Sensitive     CIPROFLOXACIN <=0.06 SENSITIVE Sensitive     GENTAMICIN <=1 SENSITIVE Sensitive     NITROFURANTOIN 64 INTERMEDIATE Intermediate     TRIMETH /SULFA  <=20 SENSITIVE Sensitive  PIP/TAZO Value in next row Sensitive ug/mL     <=4 SENSITIVEThis is a modified FDA-approved test that has been validated and its performance characteristics determined by the reporting laboratory.  This laboratory is certified under the Clinical Laboratory Improvement Amendments CLIA as qualified to perform high complexity clinical laboratory testing.    MEROPENEM Value in next row Sensitive      <=4 SENSITIVEThis is a modified FDA-approved test that has been validated and its performance characteristics determined by the reporting laboratory.  This laboratory is certified under the Clinical Laboratory Improvement Amendments CLIA as qualified to perform high complexity clinical laboratory testing.    * >=100,000 COLONIES/mL ENTEROBACTER CLOACAE   Escherichia coli - MIC*    AMPICILLIN Value in next row Resistant      <=4 SENSITIVEThis is a modified FDA-approved test that has been validated and its performance characteristics determined by the reporting laboratory.  This laboratory is certified under the Clinical Laboratory Improvement Amendments CLIA as qualified to perform high complexity clinical laboratory testing.    CEFAZOLIN (URINE) Value in next row Sensitive      16 SENSITIVEThis is a modified FDA-approved test that has been validated and its performance characteristics determined by the reporting laboratory.  This laboratory is certified under the Clinical Laboratory Improvement Amendments CLIA as qualified to perform high complexity clinical laboratory testing.    CEFEPIME  Value in next row Sensitive      16 SENSITIVEThis is a modified FDA-approved test that has been validated and its performance characteristics determined by  the reporting laboratory.  This laboratory is certified under the Clinical Laboratory Improvement Amendments CLIA as qualified to perform high complexity clinical laboratory testing.    ERTAPENEM Value in next row Sensitive      16 SENSITIVEThis is a modified FDA-approved test that has been validated and its performance characteristics determined by the reporting laboratory.  This laboratory is certified under the Clinical Laboratory Improvement Amendments CLIA as qualified to perform high complexity clinical laboratory testing.    CEFTRIAXONE  Value in next row Sensitive      16 SENSITIVEThis is a modified FDA-approved test that has been validated and its performance characteristics determined by the reporting laboratory.  This laboratory is certified under the Clinical Laboratory Improvement Amendments CLIA as qualified to perform high complexity clinical laboratory testing.    CIPROFLOXACIN Value in next row Sensitive      16 SENSITIVEThis is a modified FDA-approved test that has been validated and its performance characteristics determined by the reporting laboratory.  This laboratory is certified under the Clinical Laboratory Improvement Amendments CLIA as qualified to perform high complexity clinical laboratory testing.    GENTAMICIN Value in next row Sensitive      16 SENSITIVEThis is a modified FDA-approved test that has been validated and its performance characteristics determined by the reporting laboratory.  This laboratory is certified under the Clinical Laboratory Improvement Amendments CLIA as qualified to perform high complexity clinical laboratory testing.    NITROFURANTOIN Value in next row Sensitive      16 SENSITIVEThis is a modified FDA-approved test that has been validated and its performance characteristics determined by the reporting laboratory.  This laboratory is certified under the Clinical Laboratory Improvement Amendments CLIA as qualified to perform high complexity clinical  laboratory testing.    TRIMETH /SULFA  Value in next row Sensitive      16 SENSITIVEThis is a modified FDA-approved test that has been validated and its performance characteristics determined by the reporting laboratory.  This  laboratory is certified under the Clinical Laboratory Improvement Amendments CLIA as qualified to perform high complexity clinical laboratory testing.    AMPICILLIN/SULBACTAM Value in next row Sensitive      16 SENSITIVEThis is a modified FDA-approved test that has been validated and its performance characteristics determined by the reporting laboratory.  This laboratory is certified under the Clinical Laboratory Improvement Amendments CLIA as qualified to perform high complexity clinical laboratory testing.    PIP/TAZO Value in next row Sensitive ug/mL     <=4 SENSITIVEThis is a modified FDA-approved test that has been validated and its performance characteristics determined by the reporting laboratory.  This laboratory is certified under the Clinical Laboratory Improvement Amendments CLIA as qualified to perform high complexity clinical laboratory testing.    MEROPENEM Value in next row Sensitive      <=4 SENSITIVEThis is a modified FDA-approved test that has been validated and its performance characteristics determined by the reporting laboratory.  This laboratory is certified under the Clinical Laboratory Improvement Amendments CLIA as qualified to perform high complexity clinical laboratory testing.    * >=100,000 COLONIES/mL ESCHERICHIA COLI  MRSA Next Gen by PCR, Nasal     Status: Abnormal   Collection Time: 06/10/24  4:04 AM   Specimen: Nasal Mucosa; Nasal Swab  Result Value Ref Range Status   MRSA by PCR Next Gen DETECTED (A) NOT DETECTED Final    Comment: RESULT CALLED TO, READ BACK BY AND VERIFIED WITH: WILDA BATTLE RN AT [phone] ON 06/10/2024 BY JM (NOTE) The GeneXpert MRSA Assay (FDA approved for NASAL specimens only), is one component of a comprehensive MRSA  colonization surveillance program. It is not intended to diagnose MRSA infection nor to guide or monitor treatment for MRSA infections. Test performance is not FDA approved in patients less than 87 years old. Performed at [facility], [address] [place], [place] [address]     Please note: You were cared for by a hospitalist during your hospital stay. Once you are discharged, your primary care physician will handle any further medical issues. Please note that NO REFILLS for any discharge medications will be authorized once you are discharged, as it is imperative that you return to your primary care physician (or establish a relationship with a primary care physician if you do not have one) for your post hospital discharge needs so that they can reassess your need for medications and monitor your lab values.    Time coordinating discharge: 40 minutes  SIGNED:   Ivonne Mustache, MD  [facility] 06/17/2024, 10:27 AM Pager [phone]  If 7PM-7AM, please contact night-coverage [email or web address] Password [email or web address]

## 2024-06-17 NOTE — TOC Transition Note (Addendum)
 Transition of Care [facility]) - Discharge Note   Patient Details  Name: Jordan Kennedy MRN: [id number] Date of Birth: 10/22/1956  [facility] CM/SW Contact:  Bascom Service, RN Phone Number: 06/17/2024, 9:13 AM   Clinical Narrative:left vm w/Legal guardian Koroma [phone] to confirm d/c home w/HHC that's already set up.MD updated.  -10:49a   Koroma legal guardian just called he should be here in 1hr for p/u. No further CM needs.t     Final next level of care: Home w Home Health Services Barriers to Discharge: No Barriers Identified   Patient Goals and CMS Choice Patient states their goals for this hospitalization and ongoing recovery are:: Home CMS Medicare.gov Compare Post Acute Care list provided to:: Legal Guardian Choice offered to / list presented to : [facility] POA / Guardian [place] ownership interest in [facility].provided to:: [facility] POA / Guardian    Discharge Placement                       Discharge Plan and Services Additional resources added to the After Visit Summary for   In-house Referral: Clinical Social Work Discharge Planning Services: CM Consult Post Acute Care Choice: Home Health          DME Arranged: N/A DME Agency: NA       HH Arranged: RN, PT, OT HH Agency: [facility] Date HH Agency Contacted: 06/17/24 Time HH Agency Contacted: [phone] Representative spoke with at [facility] Agency: Arna  Social Drivers of Health (SDOH) Interventions SDOH Screenings   Food Insecurity: Patient Unable To Answer (06/10/2024)  Housing: Patient Unable To Answer (06/10/2024)  Transportation Needs: Patient Unable To Answer (06/10/2024)  Utilities: Patient Unable To Answer (06/10/2024)  Alcohol Screen: Low Risk  (11/05/2023)  Depression (PHQ2-9): Low Risk  (11/05/2023)  Financial Resource Strain: Low Risk  (11/05/2023)  Physical Activity: Inactive (11/05/2023)  Social Connections: Patient Unable To Answer (06/10/2024)  Stress: No Stress Concern  Present (11/05/2023)  Tobacco Use: Medium Risk (06/09/2024)  Health Literacy: Adequate Health Literacy (11/05/2023)     Readmission Risk Interventions    06/11/2024   10:27 AM  Readmission Risk Prevention Plan  Transportation Screening Complete  PCP or Specialist Appt within 3-5 Days Complete  HRI or Home Care Consult Complete  Social Work Consult for Recovery Care Planning/Counseling Complete  Palliative Care Screening Not Applicable  Medication Review Oceanographer) Complete

## 2024-06-18 ENCOUNTER — Telehealth: Payer: Self-pay

## 2024-06-18 NOTE — Transitions of Care (Post Inpatient/ED Visit) (Signed)
 06/18/2024  Name: Jordan Kennedy MRN: [id number] DOB: 12/17/1955  Today's TOC FU Call Status: Today's TOC FU Call Status:: Successful TOC FU Call Completed TOC FU Call Complete Date: 06/18/24 Patient's Name and Date of Birth confirmed.  Transition Care Management Follow-up Telephone Call - spoke with legal guardian from phone number listed in demographics - Jordan Kennedy Date of Discharge: 06/17/24 Discharge Facility: [facility]) Type of Discharge: Inpatient Admission How have you been since you were released from the hospital?: Better Any questions or concerns?: No  Items Reviewed: Did you receive and understand the discharge instructions provided?: Yes Medications obtained,verified, and reconciled?: Yes (Medications Reviewed) Any new allergies since your discharge?: No Dietary orders reviewed?: NA Do you have support at home?: Yes Name of Support/Comfort Primary Source: Jordan Kennedy is legal guardian and caregiver - reviewed for needs today  Medications Reviewed Today: Patient has had many medications discontinued until follow up with PCP 06/25/24 for review per caregiver/legal guardian Jordan Medications Reviewed Today     Reviewed by Jordan Kennedy GRADE, RN (Registered Nurse) on 06/18/24 at [phone]  Med List Status: <None>   Medication Order Taking? Sig Documenting Provider Last Dose Status Informant  amantadine  (SYMMETREL ) 100 MG capsule [id number] Yes Take 100 mg by mouth at bedtime.  [provider]  Active Care Giver, Multiple Informants, Pharmacy Records  aspirin  EC (ASPIRIN  EC ADULT LOW DOSE) 81 MG tablet [id number] Yes TAKE 1 TABLET BY MOUTH EVERY DAY Rudd, Garnette HERO, MD  Active Care Giver, Multiple Informants, Pharmacy Records  cloZAPine  (CLOZARIL ) 100 MG tablet [id number] Yes Take 2 tablets (200 mg total) by mouth 2 (two) times daily. Sebastian Toribio GAILS, MD  Active Care Giver, Multiple Informants, Pharmacy Records           Med Note [facility], JON HERO [place] Jan 02, 2023  9:54 AM)    cyanocobalamin  1000 MCG tablet [id number] Yes Take 1 tablet (1,000 mcg total) by mouth daily. Jillian Buttery, MD  Active   dapagliflozin  propanediol (FARXIGA ) 10 MG TABS tablet [id number] Yes TAKE 1 TABLET BY MOUTH EVERY DAY Rudd, Garnette HERO, MD  Active Care Giver, Multiple Informants, Pharmacy Records  finasteride  (PROSCAR ) 5 MG tablet [id number] Yes Take 1 tablet (5 mg total) by mouth daily. [name] HERO, MD  Active Care Giver, Pharmacy Records, Multiple Informants  LINZESS  290 MCG CAPS capsule [id number] Yes TAKE 1 CAPSULE BY MOUTH EVERY MORNING BEFORE BREAKFAST [name] HERO, MD  Active Care Giver, Pharmacy Records, Multiple Informants  Melatonin 10 MG SUBL [id number] Yes Place 10 mg under the tongue at bedtime. [provider]  Active Care Giver, Multiple Informants, Pharmacy Records  midodrine  (PROAMATINE ) 10 MG tablet [id number] Yes Take 1 tablet (10 mg total) by mouth 3 (three) times daily with meals. Jillian Buttery, MD  Active   omeprazole  (PRILOSEC) 40 MG capsule [id number] Yes TAKE 1 CAPSULE BY MOUTH EVERY EVENING Rudd, Garnette HERO, MD  Active Care Giver, Multiple Informants, Pharmacy Records  PARoxetine  (PAXIL ) 40 MG tablet [id number] Yes Take 40 mg by mouth every evening.  [provider]  Active Care Giver, Multiple Informants, Pharmacy Records  tamsulosin  (FLOMAX ) 0.4 MG CAPS capsule [id number] Yes Take 0.4 mg by mouth daily after supper. [provider]  Active Care Giver, Multiple Informants, Pharmacy Records  Med List Note Steffi Nian, CPhT 05/15/24 1535): Pill pack from [facility] caregiver ibrahim            Home Care and Equipment/Supplies: Were Home Health  Services Ordered?: Yes Name of Home Health Agency:: [facility] Has Agency set up a time to come to your home?: Yes First Home Health Visit Date:  (Anticipating a return call or he will call them back) Any new equipment or medical supplies ordered?: No  Functional  Questionnaire: Do you need assistance with bathing/showering or dressing?: Yes (needs guidance) Do you need assistance with meal preparation?: Yes Do you need assistance with eating?: Yes (needs guidance) Do you have difficulty maintaining continence: Yes (has indwelling catheter) Do you need assistance with getting out of bed/getting out of a chair/moving?: Yes (with guidance, really mobile by himselg) Do you have difficulty managing or taking your medications?: Yes (Caregiver places medications in pillbox and reviewed the medications that were discontinued on the AVS and encouraged him to take AVS to MD appointments for the many medications that were discontinued)  Follow up appointments reviewed: PCP Follow-up appointment confirmed?: Yes Date of PCP follow-up appointment?: 06/25/24 Follow-up Provider: Dr. Garnette Simpler Specialist [facility] Follow-up appointment confirmed?: Yes (Patient also has a Urology follow up) Date of Specialist follow-up appointment?: 07/01/24 Follow-Up Specialty Provider:: [facility] Do you need transportation to your follow-up appointment?: No Do you understand care options if your condition(s) worsen?: Yes-patient verbalized understanding  Jordan states he is unable to finish the call as he has a lot to take care of and needs to get on a conference call. He will provide the patient's follow up with providers noted when offered weekly calls for 30 day TOC program.  Kennedy Fish, RN, BSN, CCM [facility], [facility] RN Care Manager Direct Dial: [phone]

## 2024-06-25 ENCOUNTER — Ambulatory Visit: Payer: Self-pay | Admitting: Family Medicine

## 2024-06-25 ENCOUNTER — Ambulatory Visit: Admitting: Family Medicine

## 2024-06-25 ENCOUNTER — Encounter: Payer: Self-pay | Admitting: Family Medicine

## 2024-06-25 VITALS — BP 120/68 | HR 100 | Temp 97.0°F | Ht 72.0 in | Wt 168.2 lb

## 2024-06-25 DIAGNOSIS — N179 Acute kidney failure, unspecified: Secondary | ICD-10-CM

## 2024-06-25 DIAGNOSIS — N390 Urinary tract infection, site not specified: Secondary | ICD-10-CM | POA: Diagnosis not present

## 2024-06-25 DIAGNOSIS — K5901 Slow transit constipation: Secondary | ICD-10-CM

## 2024-06-25 DIAGNOSIS — I5022 Chronic systolic (congestive) heart failure: Secondary | ICD-10-CM

## 2024-06-25 LAB — CBC WITH DIFFERENTIAL/PLATELET
Basophils Absolute: 0.1 K/uL (ref 0.0–0.1)
Basophils Relative: 0.9 % (ref 0.0–3.0)
Eosinophils Absolute: 0.2 K/uL (ref 0.0–0.7)
Eosinophils Relative: 1.5 % (ref 0.0–5.0)
HCT: 40.4 % (ref 39.0–52.0)
Hemoglobin: 13 g/dL (ref 13.0–17.0)
Lymphocytes Relative: 18.5 % (ref 12.0–46.0)
Lymphs Abs: 2.3 K/uL (ref 0.7–4.0)
MCHC: 32.1 g/dL (ref 30.0–36.0)
MCV: 91.5 fl (ref 78.0–100.0)
Monocytes Absolute: 0.8 K/uL (ref 0.1–1.0)
Monocytes Relative: 6.1 % (ref 3.0–12.0)
Neutro Abs: 9 K/uL — ABNORMAL HIGH (ref 1.4–7.7)
Neutrophils Relative %: 73 % (ref 43.0–77.0)
Platelets: 525 K/uL — ABNORMAL HIGH (ref 150.0–400.0)
RBC: 4.42 Mil/uL (ref 4.22–5.81)
RDW: 16.7 % — ABNORMAL HIGH (ref 11.5–15.5)
WBC: 12.3 K/uL — ABNORMAL HIGH (ref 4.0–10.5)

## 2024-06-25 LAB — BASIC METABOLIC PANEL WITH GFR
BUN: 18 mg/dL (ref 6–23)
CO2: 25 meq/L (ref 19–32)
Calcium: 9.8 mg/dL (ref 8.4–10.5)
Chloride: 103 meq/L (ref 96–112)
Creatinine, Ser: 1.3 mg/dL (ref 0.40–1.50)
GFR: 57.49 mL/min — ABNORMAL LOW (ref >60.00)
Glucose, Bld: 139 mg/dL — ABNORMAL HIGH (ref 70–99)
Potassium: 4.5 meq/L (ref 3.5–5.1)
Sodium: 139 meq/L (ref 135–145)

## 2024-06-25 NOTE — Progress Notes (Signed)
 [facility] PRIMARY CARE [facility] [address] [place] [address] Dept: [phone] Dept Fax: [phone]  Hospital Follow-up Visit  Subjective:    Patient ID: Jordan Kennedy, male    DOB: [birth year], 68 y.o..   MRN: [id number]  Chief Complaint  Patient presents with   Hospitalization Follow-up    Hospital f/u.  C/o not having a bowel movement in 4-5 days.    History of Present Illness:  Patient is in today for follow-up from a recent hospitalization. Mr. Samford was admitted at [facility] from 8/19-8/27/2025 with a UTI and acute kidney injury (AKI). His urine culture showed > 100,000 E. coli and E cloacae. He also had an acute elevation of his creatinine to 4. Mr. Mccartt currently has an indwelling Foley catheter due to issues in July with bladder outlet obstruction due to BPH. At discharge, Mr. Safi ahd been recommended to stop multiple of his routine meds, including several for his CHF and his treatments for his chronic constipation. Since returning home, he has generally been feeling improved, however, he has not not had a bowel movement for 5 days. He notes some abdominal discomfort apparently secondary to this. He has not experienced any nausea or vomiting and is eating well.  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 hyperplasia 06/02/2024   Bladder outlet obstruction 05/15/2024   Enuresis 06/19/2023   Left bundle branch block 02/13/2023   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   Tinea pedis of both feet 01/11/2023   Chronic HFrEF (heart failure with reduced ejection fraction) (HCC) 01/03/2023   Prolonged QT interval 01/01/2023   Transaminitis 01/01/2023    Thrombocytosis 01/01/2023   Tardive dyskinesia 11/20/2021   BPH with obstruction/lower urinary tract symptoms 05/02/2021   History of small bowel obstruction 01/31/2021   History of colon polyps 01/31/2021   Onychomycosis 01/31/2021   Gastroesophageal reflux disease 01/31/2021   AKI (acute kidney injury) (HCC) 06/29/2018   Tobacco use disorder, mild, in sustained remission 02/01/2015   Constipation due to slow transit 04/25/2012   Anxiety and depression 04/25/2012   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 No pertinent surgical history. Family History  Problem Relation Age of Onset   Cancer Mother        unknown type   Outpatient Medications Prior to Visit  Medication Sig Dispense Refill   amantadine  (SYMMETREL ) 100 MG capsule Take 100 mg by mouth at bedtime.      aspirin  EC (ASPIRIN  EC ADULT LOW DOSE) 81 MG tablet TAKE 1 TABLET BY MOUTH EVERY DAY 90 tablet 3   cloZAPine  (CLOZARIL ) 100 MG tablet Take 2 tablets (200 mg total) by mouth 2 (two) times daily. 120 tablet 0   cyanocobalamin  1000 MCG tablet Take 1 tablet (1,000 mcg total) by mouth daily. 60 tablet 0   dapagliflozin  propanediol (FARXIGA ) 10 MG TABS tablet TAKE 1 TABLET BY MOUTH EVERY DAY 90 tablet 3   finasteride  (PROSCAR ) 5 MG tablet Take 1 tablet (5 mg total) by mouth daily. 90 tablet 3   LINZESS  290 MCG CAPS capsule TAKE 1 CAPSULE BY MOUTH EVERY MORNING BEFORE BREAKFAST 30 capsule 4   Melatonin 10 MG SUBL Place 10 mg  under the tongue at bedtime.     midodrine  (PROAMATINE ) 10 MG tablet Take 1 tablet (10 mg total) by mouth 3 (three) times daily with meals. 90 tablet 1   omeprazole  (PRILOSEC) 40 MG capsule TAKE 1 CAPSULE BY MOUTH EVERY EVENING 90 capsule 3   PARoxetine  (PAXIL ) 40 MG tablet Take 40 mg by mouth every evening.      tamsulosin  (FLOMAX ) 0.4 MG CAPS capsule Take 0.4 mg by mouth daily after supper.     No facility-administered medications prior to visit.   No  Known Allergies   Objective:   Today's Vitals   06/25/24 0829  BP: 120/68  Pulse: 100  Temp: (!) 97 F (36.1 C)  TempSrc: Temporal  SpO2: 100%  Weight: 168 lb 3.2 oz (76.3 kg)  Height: 6' (1.829 m)   Body mass index is 22.81 kg/m.   General: Well developed, well nourished. No acute distress. Abdomen: Soft. Mild diffuse tenderness. No guarding. Extremities: No edema noted. Psych: Alert and oriented. Normal mood and affect.  Health Maintenance Due  Topic Date Due   Zoster Vaccines- Shingrix (1 of 2) Never done     Assessment & Plan:   Problem List Items Addressed This Visit       Cardiovascular and Mediastinum   Chronic HFrEF (heart failure with reduced ejection fraction) (HCC)   Compensated, though mildly tachycardic today. Weight is is down 10 lbs from his last visit. Continue dapagliflozin  10 mg daily. I recommend he resume his furosemide  40 mg daily as needed, Entresto  24-26 mg bid, and metoprolol  succinate 25 mg 1/2 tab daily. Continue midodrine  10 mg tid for hypotension. Next cardiology appointment is next week.        Digestive   Constipation due to slow transit   Mr. Muzquiz is back to being significantly constipated since being off of his use of Miralax . Continue linactolide (Linzess ) 290 mg each morning. I recommend he take two capfuls of Miralax  today and then resume 1 capful daily as needed for constipation.        Genitourinary   AKI (acute kidney injury) (HCC)   I will reassess renal function today. BP is improved.      Relevant Orders   Basic metabolic panel with GFR   UTI (urinary tract infection) - Primary   I will reassess his CBC. Clinically, his infection has resolved.      Relevant Orders   CBC with Differential/Platelet   Basic metabolic panel with GFR    Return in about 4 weeks (around 07/23/2024) for Reassessment.   Garnette CHRISTELLA Simpler, MD

## 2024-06-25 NOTE — Patient Instructions (Signed)
 Resume Entresto  (sacubitril -valsartan ), furosemide , and metoprolol . Resume Miralax . For today, take 2 capfuls. May resume metoclopramide , Senna, and zolpidem  (Ambien ) as needed. Mr. Adamczak may have 1 cup of coffee daily.

## 2024-06-25 NOTE — Assessment & Plan Note (Signed)
 Jordan Kennedy is back to being significantly constipated since being off of his use of Miralax . Continue linactolide (Linzess ) 290 mg each morning. I recommend he take two capfuls of Miralax  today and then resume 1 capful daily as needed for constipation.

## 2024-06-25 NOTE — Assessment & Plan Note (Signed)
 Compensated, though mildly tachycardic today. Weight is is down 10 lbs from his last visit. Continue dapagliflozin  10 mg daily. I recommend he resume his furosemide  40 mg daily as needed, Entresto  24-26 mg bid, and metoprolol  succinate 25 mg 1/2 tab daily. Continue midodrine  10 mg tid for hypotension. Next cardiology appointment is next week.

## 2024-06-25 NOTE — Assessment & Plan Note (Signed)
 I will reassess his CBC. Clinically, his infection has resolved.

## 2024-06-25 NOTE — Assessment & Plan Note (Signed)
 I will reassess renal function today. BP is improved.

## 2024-06-26 ENCOUNTER — Other Ambulatory Visit: Payer: Self-pay

## 2024-06-26 ENCOUNTER — Encounter (HOSPITAL_COMMUNITY): Payer: Self-pay

## 2024-06-26 ENCOUNTER — Inpatient Hospital Stay (HOSPITAL_COMMUNITY)
Admission: EM | Admit: 2024-06-26 | Discharge: 2024-06-28 | DRG: 872 | Disposition: A | Discharge status: Home Health | Attending: Internal Medicine | Admitting: Internal Medicine

## 2024-06-26 ENCOUNTER — Emergency Department (HOSPITAL_COMMUNITY)

## 2024-06-26 DIAGNOSIS — Z87891 Personal history of nicotine dependence: Secondary | ICD-10-CM

## 2024-06-26 DIAGNOSIS — F209 Schizophrenia, unspecified: Secondary | ICD-10-CM | POA: Diagnosis present

## 2024-06-26 DIAGNOSIS — F17201 Nicotine dependence, unspecified, in remission: Secondary | ICD-10-CM | POA: Diagnosis present

## 2024-06-26 DIAGNOSIS — R6521 Severe sepsis with septic shock: Secondary | ICD-10-CM

## 2024-06-26 DIAGNOSIS — F79 Unspecified intellectual disabilities: Secondary | ICD-10-CM | POA: Diagnosis present

## 2024-06-26 DIAGNOSIS — R7302 Impaired glucose tolerance (oral): Secondary | ICD-10-CM | POA: Diagnosis present

## 2024-06-26 DIAGNOSIS — Z7982 Long term (current) use of aspirin: Secondary | ICD-10-CM | POA: Diagnosis not present

## 2024-06-26 DIAGNOSIS — N390 Urinary tract infection, site not specified: Secondary | ICD-10-CM | POA: Diagnosis not present

## 2024-06-26 DIAGNOSIS — N138 Other obstructive and reflux uropathy: Secondary | ICD-10-CM | POA: Diagnosis present

## 2024-06-26 DIAGNOSIS — I447 Left bundle-branch block, unspecified: Secondary | ICD-10-CM | POA: Diagnosis present

## 2024-06-26 DIAGNOSIS — N401 Enlarged prostate with lower urinary tract symptoms: Secondary | ICD-10-CM | POA: Diagnosis present

## 2024-06-26 DIAGNOSIS — N12 Tubulo-interstitial nephritis, not specified as acute or chronic: Secondary | ICD-10-CM | POA: Diagnosis present

## 2024-06-26 DIAGNOSIS — K5901 Slow transit constipation: Secondary | ICD-10-CM | POA: Diagnosis present

## 2024-06-26 DIAGNOSIS — I5022 Chronic systolic (congestive) heart failure: Secondary | ICD-10-CM | POA: Diagnosis present

## 2024-06-26 DIAGNOSIS — Z79899 Other long term (current) drug therapy: Secondary | ICD-10-CM

## 2024-06-26 DIAGNOSIS — I2489 Other forms of acute ischemic heart disease: Secondary | ICD-10-CM | POA: Diagnosis present

## 2024-06-26 DIAGNOSIS — A419 Sepsis, unspecified organism: Secondary | ICD-10-CM | POA: Diagnosis present

## 2024-06-26 DIAGNOSIS — D75839 Thrombocytosis, unspecified: Secondary | ICD-10-CM | POA: Diagnosis present

## 2024-06-26 DIAGNOSIS — R739 Hyperglycemia, unspecified: Secondary | ICD-10-CM | POA: Diagnosis present

## 2024-06-26 DIAGNOSIS — R7989 Other specified abnormal findings of blood chemistry: Secondary | ICD-10-CM | POA: Diagnosis present

## 2024-06-26 LAB — URINALYSIS, ROUTINE W REFLEX MICROSCOPIC
Bacteria, UA: NONE SEEN
Bilirubin Urine: NEGATIVE
Glucose, UA: >=500 mg/dL — AB
Ketones, ur: NEGATIVE mg/dL
Nitrite: NEGATIVE
Protein, ur: 30 mg/dL — AB
RBC / HPF: >50 RBC/hpf (ref 0–5)
Specific Gravity, Urine: 1.013 (ref 1.005–1.030)
WBC, UA: >50 WBC/hpf (ref 0–5)
pH: 7 (ref 5.0–8.0)

## 2024-06-26 LAB — LIPASE, BLOOD: Lipase: 30 U/L (ref 11–51)

## 2024-06-26 LAB — TROPONIN T, HIGH SENSITIVITY
Troponin T High Sensitivity: 76 ng/L — ABNORMAL HIGH (ref 0–19)
Troponin T High Sensitivity: 63 ng/L — ABNORMAL HIGH (ref 0–19)

## 2024-06-26 LAB — COMPREHENSIVE METABOLIC PANEL WITH GFR
ALT: 19 U/L (ref 0–44)
AST: 21 U/L (ref 15–41)
Albumin: 3.6 g/dL (ref 3.5–5.0)
Alkaline Phosphatase: 145 U/L — ABNORMAL HIGH (ref 38–126)
Anion gap: 16 — ABNORMAL HIGH (ref 5–15)
BUN: 16 mg/dL (ref 8–23)
CO2: 20 mmol/L — ABNORMAL LOW (ref 22–32)
Calcium: 9.6 mg/dL (ref 8.9–10.3)
Chloride: 103 mmol/L (ref 98–111)
Creatinine, Ser: 1.29 mg/dL — ABNORMAL HIGH (ref 0.61–1.24)
GFR, Estimated: >60 mL/min (ref >60)
Glucose, Bld: 174 mg/dL — ABNORMAL HIGH (ref 70–99)
Potassium: 4.7 mmol/L (ref 3.5–5.1)
Sodium: 138 mmol/L (ref 135–145)
Total Bilirubin: 0.4 mg/dL (ref 0.0–1.2)
Total Protein: 7.3 g/dL (ref 6.5–8.1)

## 2024-06-26 LAB — CBC WITH DIFFERENTIAL/PLATELET
Abs Immature Granulocytes: 0.07 K/uL (ref 0.00–0.07)
Basophils Absolute: 0.1 K/uL (ref 0.0–0.1)
Basophils Relative: 1 %
Eosinophils Absolute: 0.2 K/uL (ref 0.0–0.5)
Eosinophils Relative: 1 %
HCT: 43.4 % (ref 39.0–52.0)
Hemoglobin: 13.4 g/dL (ref 13.0–17.0)
Immature Granulocytes: 1 %
Lymphocytes Relative: 18 %
Lymphs Abs: 2.6 K/uL (ref 0.7–4.0)
MCH: 29.2 pg (ref 26.0–34.0)
MCHC: 30.9 g/dL (ref 30.0–36.0)
MCV: 94.6 fL (ref 80.0–100.0)
Monocytes Absolute: 0.8 K/uL (ref 0.1–1.0)
Monocytes Relative: 5 %
Neutro Abs: 11 K/uL — ABNORMAL HIGH (ref 1.7–7.7)
Neutrophils Relative %: 74 %
Platelets: 501 K/uL — ABNORMAL HIGH (ref 150–400)
RBC: 4.59 MIL/uL (ref 4.22–5.81)
RDW: 17.2 % — ABNORMAL HIGH (ref 11.5–15.5)
WBC: 14.8 K/uL — ABNORMAL HIGH (ref 4.0–10.5)
nRBC: 0 % (ref 0.0–0.2)

## 2024-06-26 LAB — CBG MONITORING, ED: Glucose-Capillary: 137 mg/dL — ABNORMAL HIGH (ref 70–99)

## 2024-06-26 LAB — MRSA NEXT GEN BY PCR, NASAL: MRSA by PCR Next Gen: NOT DETECTED

## 2024-06-26 LAB — LACTIC ACID, PLASMA
Lactic Acid, Venous: 2.1 mmol/L (ref 0.5–1.9)
Lactic Acid, Venous: 1.4 mmol/L (ref 0.5–1.9)

## 2024-06-26 MED ORDER — SODIUM CHLORIDE 0.9 % IV BOLUS
500.0000 mL | Freq: Once | INTRAVENOUS | Status: AC
  Administered 2024-06-26: 500 mL via INTRAVENOUS

## 2024-06-26 MED ORDER — MIDODRINE HCL 5 MG PO TABS
10.0000 mg | ORAL_TABLET | Freq: Once | ORAL | Status: AC
  Administered 2024-06-26: 10 mg via ORAL
  Filled 2024-06-26: qty 2

## 2024-06-26 MED ORDER — LACTATED RINGERS IV BOLUS
1000.0000 mL | Freq: Once | INTRAVENOUS | Status: AC
  Administered 2024-06-26: 1000 mL via INTRAVENOUS

## 2024-06-26 MED ORDER — ENOXAPARIN SODIUM 40 MG/0.4ML IJ SOSY
40.0000 mg | PREFILLED_SYRINGE | INTRAMUSCULAR | Status: DC
  Administered 2024-06-26 – 2024-06-27 (×2): 40 mg via SUBCUTANEOUS
  Filled 2024-06-26 (×2): qty 0.4

## 2024-06-26 MED ORDER — PANTOPRAZOLE SODIUM 40 MG PO TBEC
40.0000 mg | DELAYED_RELEASE_TABLET | Freq: Every day | ORAL | Status: DC
  Administered 2024-06-26 – 2024-06-28 (×3): 40 mg via ORAL
  Filled 2024-06-26 (×3): qty 1

## 2024-06-26 MED ORDER — ACETAMINOPHEN 325 MG PO TABS: 650.0000 mg | ORAL_TABLET | Freq: Four times a day (QID) | ORAL | Status: DC | PRN

## 2024-06-26 MED ORDER — TAMSULOSIN HCL 0.4 MG PO CAPS
0.4000 mg | ORAL_CAPSULE | Freq: Every day | ORAL | Status: DC
  Administered 2024-06-26 – 2024-06-27 (×2): 0.4 mg via ORAL
  Filled 2024-06-26 (×2): qty 1

## 2024-06-26 MED ORDER — IOHEXOL 300 MG/ML  SOLN
100.0000 mL | Freq: Once | INTRAMUSCULAR | Status: AC | PRN
  Administered 2024-06-26: 100 mL via INTRAVENOUS

## 2024-06-26 MED ORDER — ACETAMINOPHEN 650 MG RE SUPP: 650.0000 mg | Freq: Four times a day (QID) | RECTAL | Status: DC | PRN

## 2024-06-26 MED ORDER — SODIUM CHLORIDE 0.9 % IV SOLN
2.0000 g | Freq: Once | INTRAVENOUS | Status: AC
  Administered 2024-06-26: 2 g via INTRAVENOUS
  Filled 2024-06-26 (×2): qty 20

## 2024-06-26 MED ORDER — DAPAGLIFLOZIN PROPANEDIOL 10 MG PO TABS
10.0000 mg | ORAL_TABLET | Freq: Every day | ORAL | Status: DC
  Administered 2024-06-27 – 2024-06-28 (×2): 10 mg via ORAL
  Filled 2024-06-26 (×2): qty 1

## 2024-06-26 MED ORDER — ORAL CARE MOUTH RINSE: 15.0000 mL | OROMUCOSAL | Status: DC | PRN

## 2024-06-26 MED ORDER — POLYETHYLENE GLYCOL 3350 17 GM/SCOOP PO POWD: 17.0000 g | Freq: Every day | ORAL | Status: DC | PRN

## 2024-06-26 MED ORDER — MAGNESIUM OXIDE -MG SUPPLEMENT 400 (240 MG) MG PO TABS
400.0000 mg | ORAL_TABLET | Freq: Two times a day (BID) | ORAL | Status: DC
  Administered 2024-06-26 – 2024-06-28 (×4): 400 mg via ORAL
  Filled 2024-06-26 (×4): qty 1

## 2024-06-26 MED ORDER — METRONIDAZOLE 500 MG/100ML IV SOLN
500.0000 mg | Freq: Two times a day (BID) | INTRAVENOUS | Status: DC
  Administered 2024-06-26 – 2024-06-28 (×4): 500 mg via INTRAVENOUS
  Filled 2024-06-26 (×4): qty 100

## 2024-06-26 MED ORDER — METRONIDAZOLE 500 MG/100ML IV SOLN
500.0000 mg | Freq: Once | INTRAVENOUS | Status: AC
  Administered 2024-06-26: 500 mg via INTRAVENOUS
  Filled 2024-06-26: qty 100

## 2024-06-26 MED ORDER — LACTULOSE 10 GM/15ML PO SOLN: 20.0000 g | Freq: Two times a day (BID) | ORAL | Status: DC | PRN

## 2024-06-26 MED ORDER — PROCHLORPERAZINE EDISYLATE 10 MG/2ML IJ SOLN: 10.0000 mg | Freq: Four times a day (QID) | INTRAMUSCULAR | Status: DC | PRN

## 2024-06-26 MED ORDER — SODIUM CHLORIDE 0.9 % IV SOLN
2.0000 g | Freq: Three times a day (TID) | INTRAVENOUS | Status: DC
  Administered 2024-06-26 – 2024-06-28 (×6): 2 g via INTRAVENOUS
  Filled 2024-06-26 (×6): qty 12.5

## 2024-06-26 MED ORDER — LACTATED RINGERS IV BOLUS: 1000.0000 mL | Freq: Once | INTRAVENOUS | Status: DC

## 2024-06-26 MED ORDER — CHLORHEXIDINE GLUCONATE CLOTH 2 % EX PADS
6.0000 | MEDICATED_PAD | Freq: Every day | CUTANEOUS | Status: DC
  Administered 2024-06-26 – 2024-06-27 (×2): 6 via TOPICAL

## 2024-06-26 MED ORDER — FINASTERIDE 5 MG PO TABS
5.0000 mg | ORAL_TABLET | Freq: Every day | ORAL | Status: DC
  Administered 2024-06-26 – 2024-06-28 (×3): 5 mg via ORAL
  Filled 2024-06-26 (×3): qty 1

## 2024-06-26 MED ORDER — MIDODRINE HCL 5 MG PO TABS
10.0000 mg | ORAL_TABLET | Freq: Three times a day (TID) | ORAL | Status: DC
  Administered 2024-06-27 – 2024-06-28 (×5): 10 mg via ORAL
  Filled 2024-06-26 (×5): qty 2

## 2024-06-26 MED ORDER — SODIUM CHLORIDE 0.9 % IV SOLN: 2.0000 g | INTRAVENOUS | Status: DC

## 2024-06-26 MED ORDER — LACTATED RINGERS IV SOLN
150.0000 mL/h | INTRAVENOUS | Status: AC
  Administered 2024-06-26 – 2024-06-27 (×2): 150 mL/h via INTRAVENOUS

## 2024-06-26 NOTE — Consult Note (Signed)
 NAME:  Jordan Kennedy, MRN:  [id number], DOB:  06-22-1956, LOS: 0 ADMISSION DATE:  06/26/2024, CONSULTATION DATE:  06/26/24 REFERRING MD:  EDP, CHIEF COMPLAINT:  constipation   History of Present Illness:  Jordan Kennedy is a 68 y.o. M with PMH schizophrenia and intellectual disability, HFrEF with EF <20% on last echo, hx of BPH and bladder outlet obstruction with  recent admission for septic shock secondary to E. Coli and Enterobacter UTI and discharged 8/27 with an indwelling catheter who presented to the ED with constipation and generally not feeling well and was initially hypotensive 68/51.   He has had associated nausea without vomiting, thinks his last BM was around one week ago.   Work-up included a CT abd/pelvis with large volume stool throughout the colon segmental mural thickening of the colon possibly related to under-distension or colitis.  His lactic acid was 2.1, WBC 14.8, creatinine 1.29, UA with SBC >50, large hgb and leuks and negative nitrites.  He was given 1L LR and MAP improved to 70's though systolic BP still soft, therefore PCCM consulted   Pertinent  Medical History   has a past medical history of AKI (acute kidney injury) (HCC) (06/29/2018), Anemia, Anemia, iron deficiency (09/18/2015), Bowel obstruction (HCC), BPH (benign prostatic hyperplasia), Chronic constipation, Chronic systolic heart failure (HCC), History of ileus, Ileus (HCC) (03/07/2017), Mental retardation, Partial bowel obstruction (HCC), Schizophrenia (HCC), SIRS (systemic inflammatory response syndrome) (HCC) (06/29/2018), and Small bowel obstruction (HCC) (06/29/2018).   Significant Hospital Events: Including procedures, antibiotic start and stop dates in addition to other pertinent events   9/5 admit with constipation and UTI  Interim History / Subjective:  Pt awake and alert and asking for a meal   Objective    Blood pressure (!) 85/69, pulse 95, temperature 99 F (37.2 C), temperature source Oral, resp.  rate 14, height 6' (1.829 m), weight 77.1 kg, SpO2 100%.        Intake/Output Summary (Last 24 hours) at 06/26/2024 1400 Last data filed at 06/26/2024 1022 Gross per 24 hour  Intake 100 ml  Output --  Net 100 ml   Filed Weights   06/26/24 0838  Weight: 77.1 kg    General:  well nourished M, resting in bed in NAD  HEENT: MM pink/moist Neuro: awake, answers questions appropriately, moving all extremities, oriented to person and place CV: s1s2 rrr, no m/r/g PULM:  clear bilaterally on RA  GI: soft, mildly distended, active BS, non-tender to palpation Extremities: warm/dry, no edema     Resolved problem list   Assessment and Plan   Septic Shock secondary to UTI BPH with recent bladder outlet obstruction and indwelling foley History of HFrEF Constipation-recently on Linzess   BP improving after 1 L IVF, MAP >65 without pressor requirement and patient is non-toxic appearing, think he is stable for progressive admission -would trend lactic before giving further IVF in the setting of low EF -recommend meropenem given hx of enterobacter UTI, exchange foley -blood and urine cultures  -hold Entresto  and BB in the setting of hypotension -bowel regimen, per notes Miralax  has been effective   -He appears fairly euvolemic currently, would closely monitor volume status, trop 76  Thank you for this consult, PCCM will continue to follow along with you  Labs   CBC: Recent Labs  Lab 06/25/24 0901 06/26/24 0907  WBC 12.3* 14.8*  NEUTROABS 9.0* 11.0*  HGB 13.0 13.4  HCT 40.4 43.4  MCV 91.5 94.6  PLT 525.0* 501*    Basic Metabolic  Panel: Recent Labs  Lab 06/25/24 0901 06/26/24 0907  NA 139 138  K 4.5 4.7  CL 103 103  CO2 25 20*  GLUCOSE 139* 174*  BUN 18 16  CREATININE 1.30 1.29*  CALCIUM  9.8 9.6   GFR: Estimated Creatinine Clearance: 62.3 mL/min (A) (by C-G formula based on SCr of 1.29 mg/dL (H)). Recent Labs  Lab 06/25/24 0901 06/26/24 0907 06/26/24 0945  WBC  12.3* 14.8*  --   LATICACIDVEN  --   --  2.1*    Liver Function Tests: Recent Labs  Lab 06/26/24 0907  AST 21  ALT 19  ALKPHOS 145*  BILITOT 0.4  PROT 7.3  ALBUMIN 3.6   Recent Labs  Lab 06/26/24 0907  LIPASE 30   No results for input(s): AMMONIA in the last 168 hours.  ABG    Component Value Date/Time   PHART 7.414 01/01/2023 1716   PCO2ART 36.9 01/01/2023 1716   PO2ART 58 (L) 01/01/2023 1716   HCO3 23.7 01/01/2023 1716   TCO2 20 (L) 05/15/2024 1302   ACIDBASEDEF 1.0 01/01/2023 1716   O2SAT 90 01/01/2023 1716     Coagulation Profile: No results for input(s): INR, PROTIME in the last 168 hours.  Cardiac Enzymes: No results for input(s): CKTOTAL, CKMB, CKMBINDEX, TROPONINI in the last 168 hours.  HbA1C: Hgb A1c MFr Bld  Date/Time Value Ref Range Status  09/14/2015 04:37 PM 5.9 (H) <5.7 % Final    Comment:                                                                           According to the ADA Clinical Practice Recommendations for 2011, when HbA1c is used as a screening test:     >=6.5%   Diagnostic of Diabetes Mellitus            (if abnormal result is confirmed)   5.7-6.4%   Increased risk of developing Diabetes Mellitus   References:Diagnosis and Classification of Diabetes Mellitus,Diabetes Care,2011,34(Suppl 1):S62-S69 and Standards of Medical Care in         Diabetes - 2011,Diabetes Care,2011,34 (Suppl 1):S11-S61.       CBG: Recent Labs  Lab 06/26/24 0933  GLUCAP 137*    Review of Systems:   Please see the history of present illness. All other systems reviewed and are negative    Past Medical History:  He,  has a past medical history of AKI (acute kidney injury) (HCC) (06/29/2018), Anemia, Anemia, iron deficiency (09/18/2015), Bowel obstruction (HCC), BPH (benign prostatic hyperplasia), Chronic constipation, Chronic systolic heart failure (HCC), History of ileus, Ileus (HCC) (03/07/2017), Mental retardation, Partial bowel  obstruction (HCC), Schizophrenia (HCC), SIRS (systemic inflammatory response syndrome) (HCC) (06/29/2018), and Small bowel obstruction (HCC) (06/29/2018).   Surgical History:  History reviewed. No pertinent surgical history.   Social History:   reports that he has quit smoking. His smoking use included cigarettes. He has a 7.5 pack-year smoking history. He has never used smokeless tobacco. He reports that he does not drink alcohol and does not use drugs.   Family History:  His family history includes Cancer in his mother.   Allergies No Known Allergies   Home Medications  Prior to Admission medications   Medication  Sig Start Date End Date Taking? Authorizing Provider  amantadine  (SYMMETREL ) 100 MG capsule Take 100 mg by mouth at bedtime.     [provider]  aspirin  EC (ASPIRIN  EC ADULT LOW DOSE) 81 MG tablet TAKE 1 TABLET BY MOUTH EVERY DAY 02/07/24   [name] HERO, MD  cloZAPine  (CLOZARIL ) 100 MG tablet Take 2 tablets (200 mg total) by mouth 2 (two) times daily. 03/12/17   Sebastian Toribio GAILS, MD  cyanocobalamin  1000 MCG tablet Take 1 tablet (1,000 mcg total) by mouth daily. 06/18/24   Jillian Buttery, MD  dapagliflozin  propanediol (FARXIGA ) 10 MG TABS tablet TAKE 1 TABLET BY MOUTH EVERY DAY 02/07/24   [name] HERO, MD  finasteride  (PROSCAR ) 5 MG tablet Take 1 tablet (5 mg total) by mouth daily. 06/02/24 05/28/25  [name] HERO, MD  LINZESS  290 MCG CAPS capsule TAKE 1 CAPSULE BY MOUTH EVERY MORNING BEFORE BREAKFAST 05/21/24   [name] HERO, MD  Melatonin 10 MG SUBL Place 10 mg under the tongue at bedtime.    [provider]  midodrine  (PROAMATINE ) 10 MG tablet Take 1 tablet (10 mg total) by mouth 3 (three) times daily with meals. 06/17/24 08/16/24  Jillian Buttery, MD  omeprazole  (PRILOSEC) 40 MG capsule TAKE 1 CAPSULE BY MOUTH EVERY EVENING 02/07/24   [name] HERO, MD  PARoxetine  (PAXIL ) 40 MG tablet Take 40 mg by mouth every evening.     [provider]   tamsulosin  (FLOMAX ) 0.4 MG CAPS capsule Take 0.4 mg by mouth daily after supper. 01/10/21   [provider]     Critical care time:    n/a      Leita SAUNDERS Beyonka Pitney, PA-C [facility] See Amion for pager If no response to pager , please call [phone] until 7pm After 7:00 pm call Elink  [phone]

## 2024-06-26 NOTE — ED Notes (Signed)
 ED provider advised 2nd lactic and troponin can be drawn after pt bolus of fluids is finished

## 2024-06-26 NOTE — ED Triage Notes (Signed)
 Caretaker states pt has not had a BM in about 5 days. They took him to his PCP yesterday and were advised to double up on the Maalox but states it still has not worked. Have tried prune juice, warm water etc. Pt having some abdominal pain as well.

## 2024-06-26 NOTE — ED Notes (Signed)
 ED TO INPATIENT HANDOFF REPORT  ED Nurse Name and Phone #: Stephanieann Popescu L EMTP  S Name/Age/Gender [name] 68 y.o. male Room/Bed: WA11/WA11  Code Status   Code Status: Prior  Home/SNF/Other Home Patient oriented to: self, place, time, and situation Is this baseline? Yes   Triage Complete: Triage complete  Chief Complaint Sepsis due to undetermined organism [facility]) [A41.9]  Triage Note Caretaker states pt has not had a BM in about 5 days. They took him to his PCP yesterday and were advised to double up on the Maalox but states it still has not worked. Have tried prune juice, warm water etc. Pt having some abdominal pain as well.    Allergies No Known Allergies  Level of Care/Admitting Diagnosis ED Disposition     ED Disposition  Admit   Condition  --   Comment  Hospital Area: [facility]  HOSPITAL [[id number]]  Level of Care: Stepdown [14]  Admit to SDU based on following criteria: Hemodynamic compromise or significant risk of instability:  Patient requiring short term acute titration and management of vasoactive drips, and invasive monitoring (i.e., CVP and Arterial line).  May admit patient to [facility] or [facility] if equivalent level of care is available:: No  Covid Evaluation: Confirmed COVID Negative  Diagnosis: Sepsis due to undetermined organism [facility]) [[id number]]  Admitting Physician: CELINDA ALM LOT [[id number]]  Attending Physician: CELINDA ALM LOT [[id number]]  Certification:: I certify this patient will need inpatient services for at least 2 midnights  Expected Medical Readiness: 06/29/2024          B Medical/Surgery History Past Medical History:  Diagnosis Date   AKI (acute kidney injury) (HCC) 06/29/2018   Anemia    Anemia, iron deficiency 09/18/2015   Bowel obstruction (HCC)    BPH (benign prostatic hyperplasia)    Chronic constipation    Chronic systolic heart failure (HCC)    History of ileus    Ileus (HCC) 03/07/2017   Mental  retardation    Partial bowel obstruction (HCC)    Schizophrenia (HCC)    Monarche every three months;    SIRS (systemic inflammatory response syndrome) (HCC) 06/29/2018   Small bowel obstruction (HCC) 06/29/2018   History reviewed. No pertinent surgical history.   A IV Location/Drains/Wounds Patient Lines/Drains/Airways Status     Active Line/Drains/Airways     Name Placement date Placement time Site Days   Peripheral IV 06/26/24 20 G 1 Posterior;Right Forearm 06/26/24  0904  Forearm  less than 1   Urethral Catheter Primus Clifton, NT+3 Latex 16 Fr. 06/13/24  1837  Latex  13            Intake/Output Last 24 hours  Intake/Output Summary (Last 24 hours) at 06/26/2024 1437 Last data filed at 06/26/2024 1022 Gross per 24 hour  Intake 100 ml  Output --  Net 100 ml    Labs/Imaging Results for orders placed or performed during the hospital encounter of 06/26/24 (from the past 48 hours)  CBC with Differential     Status: Abnormal   Collection Time: 06/26/24  9:07 AM  Result Value Ref Range   WBC 14.8 (H) 4.0 - 10.5 K/uL   RBC 4.59 4.22 - 5.81 MIL/uL   Hemoglobin 13.4 13.0 - 17.0 g/dL   HCT 56.5 60.9 - 47.9 %   MCV 94.6 80.0 - 100.0 fL   MCH 29.2 26.0 - 34.0 pg   MCHC 30.9 30.0 - 36.0 g/dL   RDW 82.7 (H) 88.4 - 84.4 %  Platelets 501 (H) 150 - 400 K/uL   nRBC 0.0 0.0 - 0.2 %   Neutrophils Relative % 74 %   Neutro Abs 11.0 (H) 1.7 - 7.7 K/uL   Lymphocytes Relative 18 %   Lymphs Abs 2.6 0.7 - 4.0 K/uL   Monocytes Relative 5 %   Monocytes Absolute 0.8 0.1 - 1.0 K/uL   Eosinophils Relative 1 %   Eosinophils Absolute 0.2 0.0 - 0.5 K/uL   Basophils Relative 1 %   Basophils Absolute 0.1 0.0 - 0.1 K/uL   Immature Granulocytes 1 %   Abs Immature Granulocytes 0.07 0.00 - 0.07 K/uL    Comment: Performed at [facility], [address], [place], [place] [address]  Comprehensive metabolic panel     Status: Abnormal   Collection Time: 06/26/24  9:07 AM  Result  Value Ref Range   Sodium 138 135 - 145 mmol/L   Potassium 4.7 3.5 - 5.1 mmol/L   Chloride 103 98 - 111 mmol/L   CO2 20 (L) 22 - 32 mmol/L   Glucose, Bld 174 (H) 70 - 99 mg/dL    Comment: Glucose reference range applies only to samples taken after fasting for at least 8 hours.   BUN 16 8 - 23 mg/dL   Creatinine, Ser 8.70 (H) 0.61 - 1.24 mg/dL   Calcium  9.6 8.9 - 10.3 mg/dL   Total Protein 7.3 6.5 - 8.1 g/dL   Albumin 3.6 3.5 - 5.0 g/dL   AST 21 15 - 41 U/L   ALT 19 0 - 44 U/L   Alkaline Phosphatase 145 (H) 38 - 126 U/L   Total Bilirubin 0.4 0.0 - 1.2 mg/dL   GFR, Estimated >39 >39 mL/min    Comment: (NOTE) Calculated using the CKD-EPI Creatinine Equation (2021)    Anion gap 16 (H) 5 - 15    Comment: Performed at [facility], [address], [place], [place] [address]  Lipase, blood     Status: None   Collection Time: 06/26/24  9:07 AM  Result Value Ref Range   Lipase 30 11 - 51 U/L    Comment: Performed at [facility], [address] [place], [place] [address]  Urinalysis, Routine w reflex microscopic -Urine, Clean Catch     Status: Abnormal   Collection Time: 06/26/24  9:07 AM  Result Value Ref Range   Color, Urine YELLOW YELLOW   APPearance HAZY (A) CLEAR   Specific Gravity, Urine 1.013 1.005 - 1.030   pH 7.0 5.0 - 8.0   Glucose, UA >=500 (A) NEGATIVE mg/dL   Hgb urine dipstick LARGE (A) NEGATIVE   Bilirubin Urine NEGATIVE NEGATIVE   Ketones, ur NEGATIVE NEGATIVE mg/dL   Protein, ur 30 (A) NEGATIVE mg/dL   Nitrite NEGATIVE NEGATIVE   Leukocytes,Ua LARGE (A) NEGATIVE   RBC / HPF >50 0 - 5 RBC/hpf   WBC, UA >50 0 - 5 WBC/hpf   Bacteria, UA NONE SEEN NONE SEEN   Squamous Epithelial / HPF 0-5 0 - 5 /HPF   Mucus PRESENT     Comment: Performed at [facility], [address], [place], [place] [address]  Troponin T, High Sensitivity     Status: Abnormal   Collection Time: 06/26/24  9:07 AM  Result Value Ref Range    Troponin T High Sensitivity 76 (H) 0 - 19 ng/L    Comment: (NOTE) Biotin concentrations > 1000 ng/mL falsely decrease TnT results.  Serial cardiac  troponin measurements are suggested.  Refer to the Links section for chest pain algorithms and additional  guidance. Performed at [facility], [address] [place], [place] [address]   CBG monitoring, ED     Status: Abnormal   Collection Time: 06/26/24  9:33 AM  Result Value Ref Range   Glucose-Capillary 137 (H) 70 - 99 mg/dL    Comment: Glucose reference range applies only to samples taken after fasting for at least 8 hours.  Lactic acid, plasma     Status: Abnormal   Collection Time: 06/26/24  9:45 AM  Result Value Ref Range   Lactic Acid, Venous 2.1 (HH) 0.5 - 1.9 mmol/L    Comment: Critical Value, Read Back and verified with COPPER, A. RN AT 1052 ON 9.5.25. FA Performed at [facility], [address] [place], [place] [address]   Lactic acid, plasma     Status: None   Collection Time: 06/26/24  1:40 PM  Result Value Ref Range   Lactic Acid, Venous 1.4 0.5 - 1.9 mmol/L    Comment: Performed at [facility], [address] [place], [place] [address]  Troponin T, High Sensitivity     Status: Abnormal   Collection Time: 06/26/24  1:40 PM  Result Value Ref Range   Troponin T High Sensitivity 63 (H) 0 - 19 ng/L    Comment: (NOTE) Biotin concentrations > 1000 ng/mL falsely decrease TnT results.  Serial cardiac troponin measurements are suggested.  Refer to the Links section for chest pain algorithms and additional  guidance. Performed at [facility], [address] [place], [place] [address]    CT ABDOMEN PELVIS W CONTRAST Result Date: 06/26/2024 CLINICAL DATA:  Constipation and abdominal pain EXAM: CT ABDOMEN AND PELVIS WITH CONTRAST TECHNIQUE: Multidetector CT imaging of the abdomen and pelvis was performed using the standard protocol following  bolus administration of intravenous contrast. RADIATION DOSE REDUCTION: This exam was performed according to the departmental dose-optimization program which includes automated exposure control, adjustment of the mA and/or kV according to patient size and/or use of iterative reconstruction technique. CONTRAST:  OMNIPAQUE  IOHEXOL  300 MG/ML  SOLN COMPARISON:  CT abdomen and pelvis dated 06/09/2024 FINDINGS: Lower chest: Mild bilateral lower lobe bronchiectasis. No pleural effusion or pneumothorax demonstrated. Partially imaged heart size is normal. Hepatobiliary: No focal hepatic lesions. No intra or extrahepatic biliary ductal dilation. Normal gallbladder. Pancreas: No focal lesions or main ductal dilation. Spleen: Normal in size without focal abnormality. Adrenals/Urinary Tract: No adrenal nodules. Interval resolution of hydronephrosis. No renal calculi. Patchy hypoenhancement of the left upper and interpolar kidney. Decompressed urinary bladder with catheter in-situ. Stomach/Bowel: Normal appearance of the stomach. Segmental mural thickening of the underdistended rectosigmoid colon. Large volume stool throughout the colon upstream of this area. Appendix is not discretely seen. Vascular/Lymphatic: Aortic atherosclerosis. No enlarged abdominal or pelvic lymph nodes. Reproductive: Enlargement of the prostate with median lobe hypertrophy. Other: Small volume free fluid.  No free air. Musculoskeletal: No acute or abnormal lytic or blastic osseous lesions. Multilevel degenerative changes of the partially imaged thoracic and lumbar spine. IMPRESSION: 1. Large volume stool throughout the colon in keeping with reported history of constipation. Segmental mural thickening of the underdistended rectosigmoid colon may be related to underdistention or underlying colitis. 2. Patchy hypoenhancement of the left upper and interpolar kidney, suspicious for pyelonephritis. 3. Interval resolution of hydronephrosis. 4. Persistent  prostatomegaly. 5. Aortic Atherosclerosis (ICD10-I70.0). Electronically Signed   By: Limin  Xu M.D.   On: 06/26/2024 12:52    Pending Labs Unresulted Labs (From admission, onward)     Start     Ordered   06/26/24 1424  Remove and replace urinary cath (placed > 5 days) then obtain urine culture from new indwelling urinary catheter.  (Urine Culture)  Once,   R       Question:  Indication  Answer:  Sepsis   06/26/24 1423   06/26/24 1423  Culture, blood (Routine X 2) w Reflex to ID Panel  BLOOD CULTURE X 2,   R      06/26/24 1423            Vitals/Pain Today's Vitals   06/26/24 1045 06/26/24 1145 06/26/24 1259 06/26/24 1301  BP: 93/73 (!) 89/63  (!) 85/69  Pulse: (!) 101 96  95  Resp: 20 (!) 23 16 14   Temp:   99 F (37.2 C) 99 F (37.2 C)  TempSrc:   Oral Oral  SpO2: 99% 100%  100%  Weight:      Height:        Isolation Precautions No active isolations  Medications Medications  lactated ringers  bolus 1,000 mL (has no administration in time range)  metroNIDAZOLE  (FLAGYL ) IVPB 500 mg (500 mg Intravenous New Bag/Given 06/26/24 1437)  lactated ringers  bolus 1,000 mL (1,000 mLs Intravenous New Bag/Given 06/26/24 0948)  cefTRIAXone  (ROCEPHIN ) 2 g in sodium chloride  0.9 % 100 mL IVPB (0 g Intravenous Stopped 06/26/24 1022)  iohexol  (OMNIPAQUE ) 300 MG/ML solution 100 mL (100 mLs Intravenous Contrast Given 06/26/24 1218)    Mobility walks     Focused Assessments See Chart   R Recommendations: See Admitting Provider Note  Report given to:

## 2024-06-26 NOTE — ED Provider Notes (Signed)
 [place] EMERGENCY DEPARTMENT AT [facility] Provider Note   CSN: [id number] Arrival date & time: 06/26/24  9166     Patient presents with: Constipation   Jordan Kennedy is a 68 y.o. male.   68 year old male with past medical history of schizophrenia, CHF, and small bowel obstructions in the past presenting to the emergency department today after he has not a bowel movement in 5 days.  The patient started complaining of some abdominal pain this morning which was caregiver brought him in.  He saw his primary care doctor about this yesterday.  Was prescribed MiraLAX  and was today told to take a double dose of this yesterday.  The patient still not had a bowel movement started complaining of some abdominal pain this morning.  He was brought into the ER for further evaluation.  Patient's caregiver denies any fevers or vomiting.   Constipation      Prior to Admission medications   Medication Sig Start Date End Date Taking? Authorizing Provider  ENTRESTO  24-26 MG Take 1 tablet by mouth 2 (two) times daily. 06/23/24  Yes [provider]  metoCLOPramide  (REGLAN ) 5 MG tablet Take 5 mg by mouth 2 (two) times daily. 06/23/24  Yes [provider]  metoprolol  succinate (TOPROL -XL) 25 MG 24 hr tablet Take 12.5 mg by mouth daily. 06/23/24  Yes [provider]  MYRBETRIQ  50 MG TB24 tablet Take 50 mg by mouth at bedtime. 06/23/24  Yes [provider]  polyethylene glycol powder (GLYCOLAX /MIRALAX ) 17 GM/SCOOP powder SMARTSIG:17 Gram(s) By Mouth Twice a Week 06/23/24  Yes [provider]  STIMULANT LAXATIVE 8.6-50 MG tablet Take 1 tablet by mouth 2 (two) times daily. 06/23/24  Yes [provider]  amantadine  (SYMMETREL ) 100 MG capsule Take 100 mg by mouth at bedtime.     [provider]  aspirin  EC (ASPIRIN  EC ADULT LOW DOSE) 81 MG tablet TAKE 1 TABLET BY MOUTH EVERY DAY 02/07/24   [name] HERO, MD  cloZAPine  (CLOZARIL ) 100 MG tablet Take  2 tablets (200 mg total) by mouth 2 (two) times daily. 03/12/17   Sebastian Toribio GAILS, MD  cyanocobalamin  1000 MCG tablet Take 1 tablet (1,000 mcg total) by mouth daily. 06/18/24   Jillian Buttery, MD  dapagliflozin  propanediol (FARXIGA ) 10 MG TABS tablet TAKE 1 TABLET BY MOUTH EVERY DAY 02/07/24   [name] HERO, MD  finasteride  (PROSCAR ) 5 MG tablet Take 1 tablet (5 mg total) by mouth daily. 06/02/24 05/28/25  [name] HERO, MD  LINZESS  290 MCG CAPS capsule TAKE 1 CAPSULE BY MOUTH EVERY MORNING BEFORE BREAKFAST 05/21/24   [name] HERO, MD  Melatonin 10 MG SUBL Place 10 mg under the tongue at bedtime.    [provider]  midodrine  (PROAMATINE ) 10 MG tablet Take 1 tablet (10 mg total) by mouth 3 (three) times daily with meals. 06/17/24 08/16/24  Jillian Buttery, MD  omeprazole  (PRILOSEC) 40 MG capsule TAKE 1 CAPSULE BY MOUTH EVERY EVENING 02/07/24   [name] HERO, MD  PARoxetine  (PAXIL ) 40 MG tablet Take 40 mg by mouth every evening.     [provider]  tamsulosin  (FLOMAX ) 0.4 MG CAPS capsule Take 0.4 mg by mouth daily after supper. 01/10/21   [provider]    Allergies: Patient has no known allergies.    Review of Systems  Reason unable to perform ROS: Baseline mental status patient.  Gastrointestinal:  Positive for constipation.  All other systems reviewed and are negative.   Updated  Vital Signs BP (!) 85/69   Pulse 95   Temp 99 F (37.2 C) (Oral)   Resp 12   Ht 6' (1.829 m)   Wt 77.1 kg   SpO2 100%   BMI 23.06 kg/m   Physical Exam Vitals and nursing note reviewed.   Gen: NAD Eyes: PERRL, EOMI HEENT: no oropharyngeal swelling Neck: trachea midline Resp: clear to auscultation bilaterally Card: RRR, no murmurs, rubs, or gallops Abd: Tender over the left lower quadrant and periumbilical region with no guarding or rebound Extremities: no calf tenderness, no edema Vascular: 2+ radial pulses bilaterally, 2+ DP pulses bilaterally Skin: no  rashes Psyc: acting appropriately   (all labs ordered are listed, but only abnormal results are displayed) Labs Reviewed  CBC WITH DIFFERENTIAL/PLATELET - Abnormal; Notable for the following components:      Result Value   WBC 14.8 (*)    RDW 17.2 (*)    Platelets 501 (*)    Neutro Abs 11.0 (*)    All other components within normal limits  COMPREHENSIVE METABOLIC PANEL WITH GFR - Abnormal; Notable for the following components:   CO2 20 (*)    Glucose, Bld 174 (*)    Creatinine, Ser 1.29 (*)    Alkaline Phosphatase 145 (*)    Anion gap 16 (*)    All other components within normal limits  URINALYSIS, ROUTINE W REFLEX MICROSCOPIC - Abnormal; Notable for the following components:   APPearance HAZY (*)    Glucose, UA >=500 (*)    Hgb urine dipstick LARGE (*)    Protein, ur 30 (*)    Leukocytes,Ua LARGE (*)    All other components within normal limits  LACTIC ACID, PLASMA - Abnormal; Notable for the following components:   Lactic Acid, Venous 2.1 (*)    All other components within normal limits  CBG MONITORING, ED - Abnormal; Notable for the following components:   Glucose-Capillary 137 (*)    All other components within normal limits  TROPONIN T, HIGH SENSITIVITY - Abnormal; Notable for the following components:   Troponin T High Sensitivity 76 (*)    All other components within normal limits  TROPONIN T, HIGH SENSITIVITY - Abnormal; Notable for the following components:   Troponin T High Sensitivity 63 (*)    All other components within normal limits  CULTURE, BLOOD (ROUTINE X 2)  CULTURE, BLOOD (ROUTINE X 2)  URINE CULTURE  MRSA NEXT GEN BY PCR, NASAL  LIPASE, BLOOD  LACTIC ACID, PLASMA    EKG: EKG Interpretation Date/Time:  Friday June 26 2024 09:39:13 EDT Ventricular Rate:  102 PR Interval:  154 QRS Duration:  168 QT Interval:  419 QTC Calculation: 546 R Axis:   2  Text Interpretation: Sinus tachycardia Probable left atrial enlargement LBBB with  nonspecific ST-T changes, artifact in leads 2 and 3, similar in morphology to previous EKG, no STEMI Confirmed by Ula Barter [phone]) on 06/26/2024 9:43:33 AM  Radiology: CT ABDOMEN PELVIS W CONTRAST Result Date: 06/26/2024 CLINICAL DATA:  Constipation and abdominal pain EXAM: CT ABDOMEN AND PELVIS WITH CONTRAST TECHNIQUE: Multidetector CT imaging of the abdomen and pelvis was performed using the standard protocol following bolus administration of intravenous contrast. RADIATION DOSE REDUCTION: This exam was performed according to the departmental dose-optimization program which includes automated exposure control, adjustment of the mA and/or kV according to patient size and/or use of iterative reconstruction technique. CONTRAST:  OMNIPAQUE  IOHEXOL  300 MG/ML  SOLN COMPARISON:  CT abdomen and pelvis dated 06/09/2024  FINDINGS: Lower chest: Mild bilateral lower lobe bronchiectasis. No pleural effusion or pneumothorax demonstrated. Partially imaged heart size is normal. Hepatobiliary: No focal hepatic lesions. No intra or extrahepatic biliary ductal dilation. Normal gallbladder. Pancreas: No focal lesions or main ductal dilation. Spleen: Normal in size without focal abnormality. Adrenals/Urinary Tract: No adrenal nodules. Interval resolution of hydronephrosis. No renal calculi. Patchy hypoenhancement of the left upper and interpolar kidney. Decompressed urinary bladder with catheter in-situ. Stomach/Bowel: Normal appearance of the stomach. Segmental mural thickening of the underdistended rectosigmoid colon. Large volume stool throughout the colon upstream of this area. Appendix is not discretely seen. Vascular/Lymphatic: Aortic atherosclerosis. No enlarged abdominal or pelvic lymph nodes. Reproductive: Enlargement of the prostate with median lobe hypertrophy. Other: Small volume free fluid.  No free air. Musculoskeletal: No acute or abnormal lytic or blastic osseous lesions. Multilevel degenerative changes of the  partially imaged thoracic and lumbar spine. IMPRESSION: 1. Large volume stool throughout the colon in keeping with reported history of constipation. Segmental mural thickening of the underdistended rectosigmoid colon may be related to underdistention or underlying colitis. 2. Patchy hypoenhancement of the left upper and interpolar kidney, suspicious for pyelonephritis. 3. Interval resolution of hydronephrosis. 4. Persistent prostatomegaly. 5. Aortic Atherosclerosis (ICD10-I70.0). Electronically Signed   By: Limin  Xu M.D.   On: 06/26/2024 12:52     Procedures   Medications Ordered in the ED  lactated ringers  infusion (has no administration in time range)  cefTRIAXone  (ROCEPHIN ) 2 g in sodium chloride  0.9 % 100 mL IVPB (has no administration in time range)  metroNIDAZOLE  (FLAGYL ) IVPB 500 mg (has no administration in time range)  enoxaparin  (LOVENOX ) injection 40 mg (has no administration in time range)  acetaminophen  (TYLENOL ) tablet 650 mg (has no administration in time range)    Or  acetaminophen  (TYLENOL ) suppository 650 mg (has no administration in time range)  prochlorperazine  (COMPAZINE ) injection 10 mg (has no administration in time range)  Chlorhexidine  Gluconate Cloth 2 % PADS 6 each (has no administration in time range)  Oral care mouth rinse (has no administration in time range)  lactated ringers  bolus 1,000 mL (1,000 mLs Intravenous New Bag/Given 06/26/24 0948)  cefTRIAXone  (ROCEPHIN ) 2 g in sodium chloride  0.9 % 100 mL IVPB (0 g Intravenous Stopped 06/26/24 1022)  iohexol  (OMNIPAQUE ) 300 MG/ML solution 100 mL (100 mLs Intravenous Contrast Given 06/26/24 1218)  metroNIDAZOLE  (FLAGYL ) IVPB 500 mg (500 mg Intravenous New Bag/Given 06/26/24 1437)                                    Medical Decision Making 68 year old male with past medical history of schizophrenia, MR, small bowel obstruction, and CHF in the past presenting to the emergency department today with abdominal pain.  I will  further evaluate the patient here with basic labs including LFTs and lipase to evaluate for hepatobiliary pathology or pancreatitis.  Will obtain a CT scan of his abdomen to evaluate for recurrent obstruction, diverticulitis, colitis, or other intra-abdominal pathology.  I will reevaluate for ultimate disposition.  If this is just secondary to constipation will work on the patient's bowel regimen.  The patient's EKG does show a left bundle branch block and some artifact in the inferior leads.  This does appear relatively similar in morphology to his previous EKG and I do not think that this represents a STEMI at this time.  Will add on troponins for further evaluation for ACS but the  patient is denying any chest pain.  I did call by nursing staff to reevaluate the patient as his blood pressure was reading low.  Lactic acid is ordered and IV fluids are ordered.  Will hold off on antibiotics at this time until initial labs and lactic acid are resulted.  The patient's CT scan does show findings concerning for colitis and pyelonephritis.-I did discuss patient's case with our ICU team.  They evaluated the patient and felt that he was stable for admission to the stepdown unit.  Recommended against further fluid resuscitation with downtrending lactic acid.  A call is placed to hospitalist service and Dr. Celinda will admit for further evaluation.  CRITICAL CARE Performed by: Prentice JONELLE Medicus   Total critical care time: 35 minutes  Critical care time was exclusive of separately billable procedures and treating other patients.  Critical care was necessary to treat or prevent imminent or life-threatening deterioration.  Critical care was time spent personally by me on the following activities: development of treatment plan with patient and/or surrogate as well as nursing, discussions with consultants, evaluation of patient's response to treatment, examination of patient, obtaining history from patient or surrogate,  ordering and performing treatments and interventions, ordering and review of laboratory studies, ordering and review of radiographic studies, pulse oximetry and re-evaluation of patient's condition.   Amount and/or Complexity of Data Reviewed Labs: ordered. Radiology: ordered.  Risk Prescription drug management. Decision regarding hospitalization.        Final diagnoses:  Pyelonephritis    ED Discharge Orders     None          [facility] Prentice JONELLE, MD 06/26/24 [phone]

## 2024-06-26 NOTE — ED Notes (Signed)
 Provider notified of patients EF of 20% and ordered fluids. Per provider, it is okay to run fluids at 250-529mls/hr until lactic acid results.

## 2024-06-26 NOTE — H&P (Signed)
 History and Physical    Patient: Jordan Kennedy FMW:[id number] DOB: [birth year] DOA: 06/26/2024 DOS: the patient was seen and examined on 06/26/2024 PCP: [name] HERO, MD  Patient coming from: Home  Chief Complaint:  Chief Complaint  Patient presents with   Constipation   HPI: Jordan Kennedy is a 68 y.o. male with medical history significant of AKI, iron deficiency anemia, bowel obstruction, BPH, chronic constipation, chronic systolic heart failure, history of partial bowel obstruction, SBO, history of ileus, mental retardation, schizophrenia who presented to the emergency with complaints of constipation.  He saw his primary care provider yesterday who put him on MiraLAX .  The last 2 months, he has been having urinary retention requiring Foley catheterization.  His most recent catheterization was on 06/13/2024. No abdominal pain, nausea, emesis, diarrhea, melena or hematochezia.  No flank pain, dysuria, frequency or hematuria. He denied fever, chills, rhinorrhea, sore throat, wheezing or hemoptysis.  No chest pain, palpitations, diaphoresis, PND, orthopnea or pitting edema of the lower extremities.   No polyuria, polydipsia, polyphagia or blurred vision.   Lab work: Urinalysis showed greater than 500 glucose and protein of 30 mg/dL.  There was a large leukocyte esterase, greater than 50 RBC greater than 50 WBC and no bacteria seen.  CBC showed a white count of 14.8 with 74% neutrophils, hemoglobin 13.4 g/dL platelets 498.  Lipase was normal.  Lactic acid is 2.1 then 1.4 mmol/L.  Troponin was 76 then 63 ng/L.  CMP showed a CO2 of 20 mmol/L with an anion gap of 16, the rest of the electrolytes were normal.  Glucose 174, BUN 16 and creatinine 1.29 mg/dL.  LFTs were unremarkable with the exception of an alkaline phosphatase of 145 units/L.  Imaging: CT abdomen/pelvis with contrast showed large volume stool throughout the colon.  There is segmental mural thickening of the underdistended rectosigmoid colon  that may be related to underdistention or underlying colitis.  There is patchy hypoenhancement of the left upper and interpolar kidney, suspicious for pyelonephritis.  Interval resolution of the hydronephrosis.  Persistent prostatomegaly.  Aortic atherosclerosis.  ED course: Initial vital signs were temperature 98.2 F, pulse 92, respiration 18, BP 130/95 mmHg O2 sat 99% on room air.  The patient received ceftriaxone  2 g IVPB, LR 1000 mL bolus followed by LR 150 mL/h, metronidazole  500 mg IVPB and I added midodrine  10 mg p.o. x 1 and normal saline 500 mm bolus.   Review of Systems: As mentioned in the history of present illness. All other systems reviewed and are negative. Past Medical History:  Diagnosis Date   AKI (acute kidney injury) (HCC) 06/29/2018   Anemia    Anemia, iron deficiency 09/18/2015   Bowel obstruction (HCC)    BPH (benign prostatic hyperplasia)    Chronic constipation    Chronic systolic heart failure (HCC)    History of ileus    Ileus (HCC) 03/07/2017   Mental retardation    Partial bowel obstruction (HCC)    Schizophrenia (HCC)    Monarche every three months;    SIRS (systemic inflammatory response syndrome) (HCC) 06/29/2018   Small bowel obstruction (HCC) 06/29/2018   History reviewed. No pertinent surgical history. Social History:  reports that he has quit smoking. His smoking use included cigarettes. He has a 7.5 pack-year smoking history. He has never used smokeless tobacco. He reports that he does not drink alcohol and does not use drugs.  No Known Allergies  Family History  Problem Relation Age of Onset   Cancer Mother  unknown type    Prior to Admission medications   Medication Sig Start Date End Date Taking? Authorizing Provider  amantadine  (SYMMETREL ) 100 MG capsule Take 100 mg by mouth at bedtime.     [provider]  aspirin  EC (ASPIRIN  EC ADULT LOW DOSE) 81 MG tablet TAKE 1 TABLET BY MOUTH EVERY DAY 02/07/24   [name] HERO, MD   cloZAPine  (CLOZARIL ) 100 MG tablet Take 2 tablets (200 mg total) by mouth 2 (two) times daily. 03/12/17   Sebastian Toribio GAILS, MD  cyanocobalamin  1000 MCG tablet Take 1 tablet (1,000 mcg total) by mouth daily. 06/18/24   Jillian Buttery, MD  dapagliflozin  propanediol (FARXIGA ) 10 MG TABS tablet TAKE 1 TABLET BY MOUTH EVERY DAY 02/07/24   [name] HERO, MD  finasteride  (PROSCAR ) 5 MG tablet Take 1 tablet (5 mg total) by mouth daily. 06/02/24 05/28/25  [name] HERO, MD  LINZESS  290 MCG CAPS capsule TAKE 1 CAPSULE BY MOUTH EVERY MORNING BEFORE BREAKFAST 05/21/24   [name] HERO, MD  Melatonin 10 MG SUBL Place 10 mg under the tongue at bedtime.    [provider]  midodrine  (PROAMATINE ) 10 MG tablet Take 1 tablet (10 mg total) by mouth 3 (three) times daily with meals. 06/17/24 08/16/24  Jillian Buttery, MD  omeprazole  (PRILOSEC) 40 MG capsule TAKE 1 CAPSULE BY MOUTH EVERY EVENING 02/07/24   [name] HERO, MD  PARoxetine  (PAXIL ) 40 MG tablet Take 40 mg by mouth every evening.     [provider]  tamsulosin  (FLOMAX ) 0.4 MG CAPS capsule Take 0.4 mg by mouth daily after supper. 01/10/21   [provider]    Physical Exam: Vitals:   06/26/24 1045 06/26/24 1145 06/26/24 1259 06/26/24 1301  BP: 93/73 (!) 89/63  (!) 85/69  Pulse: (!) 101 96  95  Resp: 20 (!) 23 16 14   Temp:   99 F (37.2 C) 99 F (37.2 C)  TempSrc:   Oral Oral  SpO2: 99% 100%  100%  Weight:      Height:       Physical Exam Vitals and nursing note reviewed.  Constitutional:      General: He is awake. He is not in acute distress.    Appearance: He is ill-appearing.  HENT:     Head: Normocephalic.     Nose: No rhinorrhea.     Mouth/Throat:     Mouth: Mucous membranes are moist.  Eyes:     General: No scleral icterus.    Pupils: Pupils are equal, round, and reactive to light.  Neck:     Vascular: No JVD.  Cardiovascular:     Rate and Rhythm: Normal rate and regular rhythm.     Heart sounds: S1  normal and S2 normal.  Pulmonary:     Effort: Pulmonary effort is normal.     Breath sounds: Normal breath sounds. No wheezing, rhonchi or rales.  Abdominal:     General: Bowel sounds are normal. There is no distension.     Palpations: Abdomen is soft.     Tenderness: There is no abdominal tenderness. There is no right CVA tenderness or left CVA tenderness.  Musculoskeletal:     Cervical back: Neck supple.     Right lower leg: No edema.     Left lower leg: No edema.  Skin:    General: Skin is warm and dry.  Neurological:     General: No focal deficit present.     Mental Status:  He is alert and oriented to person, place, and time.  Psychiatric:        Mood and Affect: Mood normal.        Behavior: Behavior normal. Behavior is cooperative.     Data Reviewed:  Results are pending, will review when available. 05/02/2023 echocardiogram report. IMPRESSIONS:   1. Left ventricular ejection fraction, by estimation, is <20%. The left  ventricle has severely decreased function. The left ventricle demonstrates  global hypokinesis. Septal-lateral dyssynchrony consistent wtih LBBB. The  left ventricular internal cavity   size was moderately dilated. Left ventricular diastolic parameters are  consistent with Grade I diastolic dysfunction (impaired relaxation).   2. Right ventricular systolic function is moderately reduced. The right  ventricular size is normal. Tricuspid regurgitation signal is inadequate  for assessing PA pressure.   3. Left atrial size was mildly dilated.   4. The mitral valve is normal in structure. Mild mitral valve  regurgitation. No evidence of mitral stenosis.   5. The aortic valve is tricuspid. Aortic valve regurgitation is not  visualized. No aortic stenosis is present.   6. The inferior vena cava is normal in size with greater than 50%  respiratory variability, suggesting right atrial pressure of 3 mmHg.   7. Biventricular failure with dyssynchrony. Consider  CRT, consider  advanced HF.   EKG: Vent. rate 102 BPM PR interval 154 ms QRS duration 168 ms QT/QTcB 419/546 ms P-R-T axes 21 2 * Sinus tachycardia Probable left atrial enlargement LBBB with nonspecific ST-T changes, artifact in leads 2 and 3  Assessment and Plan: Principal Problem:   Sepsis due to undetermined organism (HCC) Questionable colitis and/or urinary tract infection . Admit to SDU/inpatient. Continue IV fluids. Continue cefepime  2 g every 8 hours.   Continue metronidazole  500 mg IVPB q 12 hr. Follow-up blood culture and sensitivity Follow CBC and CMP in a.m. PCCM consult appreciated.    Active Problems:   BPH with obstruction/lower urinary tract symptoms Foley will be exchanged. Continue tamsulosin  0.4 mg p.o. daily. Continue finasteride  5 mg p.o. daily. Patient to follow-up with urology as an outpatient.    Elevated troponin Secondary to demand ischemia. Downtrending.    Chronic HFrEF  (heart failure with reduced ejection fraction) (HCC) Seems to be volume depleted at the moment. Will hold Entresto , beta-blocker today. Check transthoracic echocardiogram.    Hyperglycemia With prior history of:   Glucose intolerance (impaired glucose tolerance) Hemoglobin A1c in 2016 was 5.9%. Will check hemoglobin A1c in AM.    Intellectual disability   Schizophrenia (HCC) Continue current treatment pending med rec.    Thrombocytosis Monitor platelet count.    Constipation due to slow transit Begin magnesium  oxide 400 mg p.o. twice daily. Begin MiraLAX  17 g p.o. daily.    Tobacco use disorder, mild, in sustained remission Continues to be in remission according to the patient.     Advance Care Planning:   Code Status: Full Code   Consults: PCCM Raquel Herter, MD)  Family Communication:   Severity of Illness: The appropriate patient status for this patient is INPATIENT. Inpatient status is judged to be reasonable and necessary in order to provide the  required intensity of service to ensure the patient's safety. The patient's presenting symptoms, physical exam findings, and initial radiographic and laboratory data in the context of their chronic comorbidities is felt to place them at high risk for further clinical deterioration. Furthermore, it is not anticipated that the patient will be medically stable for discharge from the  hospital within 2 midnights of admission.   * I certify that at the point of admission it is my clinical judgment that the patient will require inpatient hospital care spanning beyond 2 midnights from the point of admission due to high intensity of service, high risk for further deterioration and high frequency of surveillance required.*  Author: Alm Dorn Castor, MD 06/26/2024 2:10 PM  For on call review [email or web address].   This document was prepared using Dragon voice recognition software and may contain some unintended transcription errors.

## 2024-06-27 DIAGNOSIS — A419 Sepsis, unspecified organism: Secondary | ICD-10-CM | POA: Diagnosis not present

## 2024-06-27 LAB — CBC WITH DIFFERENTIAL/PLATELET
Abs Immature Granulocytes: 0.04 K/uL (ref 0.00–0.07)
Basophils Absolute: 0.1 K/uL (ref 0.0–0.1)
Basophils Relative: 1 %
Eosinophils Absolute: 0.3 K/uL (ref 0.0–0.5)
Eosinophils Relative: 2 %
HCT: 37.6 % — ABNORMAL LOW (ref 39.0–52.0)
Hemoglobin: 11.7 g/dL — ABNORMAL LOW (ref 13.0–17.0)
Immature Granulocytes: 0 %
Lymphocytes Relative: 31 %
Lymphs Abs: 3.8 K/uL (ref 0.7–4.0)
MCH: 29.8 pg (ref 26.0–34.0)
MCHC: 31.1 g/dL (ref 30.0–36.0)
MCV: 95.7 fL (ref 80.0–100.0)
Monocytes Absolute: 1.2 K/uL — ABNORMAL HIGH (ref 0.1–1.0)
Monocytes Relative: 9 %
Neutro Abs: 6.9 K/uL (ref 1.7–7.7)
Neutrophils Relative %: 57 %
Platelets: 441 K/uL — ABNORMAL HIGH (ref 150–400)
RBC: 3.93 MIL/uL — ABNORMAL LOW (ref 4.22–5.81)
RDW: 17 % — ABNORMAL HIGH (ref 11.5–15.5)
WBC: 12.2 K/uL — ABNORMAL HIGH (ref 4.0–10.5)
nRBC: 0 % (ref 0.0–0.2)

## 2024-06-27 LAB — COMPREHENSIVE METABOLIC PANEL WITH GFR
ALT: 13 U/L (ref 0–44)
AST: 19 U/L (ref 15–41)
Albumin: 3.2 g/dL — ABNORMAL LOW (ref 3.5–5.0)
Alkaline Phosphatase: 131 U/L — ABNORMAL HIGH (ref 38–126)
Anion gap: 11 (ref 5–15)
BUN: 17 mg/dL (ref 8–23)
CO2: 23 mmol/L (ref 22–32)
Calcium: 9 mg/dL (ref 8.9–10.3)
Chloride: 103 mmol/L (ref 98–111)
Creatinine, Ser: 1.07 mg/dL (ref 0.61–1.24)
GFR, Estimated: >60 mL/min (ref >60)
Glucose, Bld: 99 mg/dL (ref 70–99)
Potassium: 4.2 mmol/L (ref 3.5–5.1)
Sodium: 137 mmol/L (ref 135–145)
Total Bilirubin: 0.3 mg/dL (ref 0.0–1.2)
Total Protein: 6.3 g/dL — ABNORMAL LOW (ref 6.5–8.1)

## 2024-06-27 LAB — URINE CULTURE: Culture: NO GROWTH

## 2024-06-27 LAB — HEMOGLOBIN A1C
Hgb A1c MFr Bld: 6.6 % — ABNORMAL HIGH (ref 4.8–5.6)
Mean Plasma Glucose: 142.72 mg/dL

## 2024-06-27 MED ORDER — FLEET ENEMA RE ENEM: 1.0000 | ENEMA | Freq: Every day | RECTAL | Status: DC | PRN

## 2024-06-27 MED ORDER — POLYETHYLENE GLYCOL 3350 17 GM/SCOOP PO POWD
17.0000 g | Freq: Two times a day (BID) | ORAL | Status: DC
  Administered 2024-06-27 (×2): 17 g via ORAL
  Filled 2024-06-27 (×3): qty 119

## 2024-06-27 MED ORDER — SENNOSIDES-DOCUSATE SODIUM 8.6-50 MG PO TABS
1.0000 | ORAL_TABLET | Freq: Two times a day (BID) | ORAL | Status: DC
  Administered 2024-06-27 – 2024-06-28 (×3): 1 via ORAL
  Filled 2024-06-27 (×3): qty 1

## 2024-06-27 MED ORDER — LACTULOSE 10 GM/15ML PO SOLN
10.0000 g | Freq: Once | ORAL | Status: AC
  Administered 2024-06-27: 10 g via ORAL
  Filled 2024-06-27: qty 15

## 2024-06-27 NOTE — Plan of Care (Signed)
  Problem: Clinical Measurements: Goal: Respiratory complications will improve Outcome: Progressing   Problem: Nutrition: Goal: Adequate nutrition will be maintained Outcome: Progressing   Problem: Elimination: Goal: Will not experience complications related to urinary retention Outcome: Progressing   Problem: Health Behavior/Discharge Planning: Goal: Ability to manage health-related needs will improve Outcome: Not Progressing   Problem: Clinical Measurements: Goal: Cardiovascular complication will be avoided Outcome: Not Progressing   Problem: Elimination: Goal: Will not experience complications related to bowel motility Outcome: Not Progressing

## 2024-06-27 NOTE — Plan of Care (Signed)
  Problem: Clinical Measurements: Goal: Respiratory complications will improve Outcome: Progressing   Problem: Nutrition: Goal: Adequate nutrition will be maintained Outcome: Progressing   Problem: Elimination: Goal: Will not experience complications related to urinary retention Outcome: Progressing   Problem: Pain Managment: Goal: General experience of comfort will improve and/or be controlled Outcome: Progressing   Problem: Education: Goal: Knowledge of General Education information will improve Description: Including pain rating scale, medication(s)/side effects and non-pharmacologic comfort measures Outcome: Not Progressing   Problem: Clinical Measurements: Goal: Cardiovascular complication will be avoided Outcome: Not Progressing   Problem: Elimination: Goal: Will not experience complications related to bowel motility Outcome: Not Progressing

## 2024-06-27 NOTE — Progress Notes (Signed)
 NAME:  Jordan Kennedy, MRN:  [id number], DOB:  1956-01-24, LOS: 1 ADMISSION DATE:  06/26/2024, CONSULTATION DATE:  06/27/24 REFERRING MD:  EDP, CHIEF COMPLAINT:  constipation   History of Present Illness:  Jordan Kennedy is a 68 y.o. M with PMH schizophrenia and intellectual disability, HFrEF with EF <20% on last echo, hx of BPH and bladder outlet obstruction with  recent admission for septic shock secondary to E. Coli and Enterobacter UTI and discharged 8/27 with an indwelling catheter who presented to the ED with constipation and generally not feeling well and was initially hypotensive 68/51.   He has had associated nausea without vomiting, thinks his last BM was around one week ago.   Work-up included a CT abd/pelvis with large volume stool throughout the colon segmental mural thickening of the colon possibly related to under-distension or colitis.  His lactic acid was 2.1, WBC 14.8, creatinine 1.29, UA with SBC >50, large hgb and leuks and negative nitrites.  He was given 1L LR and MAP improved to 70's though systolic BP still soft, therefore PCCM consulted   Pertinent  Medical History   has a past medical history of AKI (acute kidney injury) (HCC) (06/29/2018), Anemia, Anemia, iron deficiency (09/18/2015), Bowel obstruction (HCC), BPH (benign prostatic hyperplasia), Chronic constipation, Chronic systolic heart failure (HCC), History of ileus, Ileus (HCC) (03/07/2017), Mental retardation, Partial bowel obstruction (HCC), Schizophrenia (HCC), SIRS (systemic inflammatory response syndrome) (HCC) (06/29/2018), and Small bowel obstruction (HCC) (06/29/2018).   Significant Hospital Events: Including procedures, antibiotic start and stop dates in addition to other pertinent events   9/5 admit with constipation and UTI 06/27/2024 blood pressure slightly improved.  Interim History / Subjective:  Patient is awake and asking for more food.  He tolerated having his breakfast without any issues.  Still has  not pooped  Objective    Blood pressure 130/62, pulse (!) 101, temperature 98.4 F (36.9 C), temperature source Oral, resp. rate 18, height 6' 5 (1.956 m), weight 77.5 kg, SpO2 100%.        Intake/Output Summary (Last 24 hours) at 06/27/2024 9071 Last data filed at 06/27/2024 9388 Gross per 24 hour  Intake 2122.42 ml  Output 2750 ml  Net -627.58 ml   Filed Weights   06/26/24 9161 06/26/24 1629  Weight: 77.1 kg 77.5 kg    General:  well nourished M, resting in bed in NAD  HEENT: MM pink/moist Neuro: awake, answers questions appropriately, moving all extremities, oriented to person and place CV: s1s2 rrr, no m/r/g PULM:  clear bilaterally on RA  GI: Abdomen is soft, distended, positive bowel sounds Extremities: Warm, no edema    Resolved problem list   Assessment and Plan   Septic Shock secondary to UTI versus colitis BPH with recent bladder outlet obstruction and indwelling foley History of HFrEF Constipation-recently on Linzess   Patient appears hemodynamically stable.  Will finish course of antibiotics.  Still has not pooped and would start an aggressive bowel regimen.   Given that he is tolerating a diet, recommend conservative fluid management.  PCCM will sign off at this time.  Please reach out for any questions or concerns Labs   CBC: Recent Labs  Lab 06/25/24 0901 06/26/24 0907 06/27/24 0303  WBC 12.3* 14.8* 12.2*  NEUTROABS 9.0* 11.0* 6.9  HGB 13.0 13.4 11.7*  HCT 40.4 43.4 37.6*  MCV 91.5 94.6 95.7  PLT 525.0* 501* 441*    Basic Metabolic Panel: Recent Labs  Lab 06/25/24 0901 06/26/24 0907 06/27/24 0303  NA  139 138 137  K 4.5 4.7 4.2  CL 103 103 103  CO2 25 20* 23  GLUCOSE 139* 174* 99  BUN 18 16 17   CREATININE 1.30 1.29* 1.07  CALCIUM  9.8 9.6 9.0   GFR: Estimated Creatinine Clearance: 75.4 mL/min (by C-G formula based on SCr of 1.07 mg/dL). Recent Labs  Lab 06/25/24 0901 06/26/24 0907 06/26/24 0945 06/26/24 1340 06/27/24 0303   WBC 12.3* 14.8*  --   --  12.2*  LATICACIDVEN  --   --  2.1* 1.4  --     Liver Function Tests: Recent Labs  Lab 06/26/24 0907 06/27/24 0303  AST 21 19  ALT 19 13  ALKPHOS 145* 131*  BILITOT 0.4 0.3  PROT 7.3 6.3*  ALBUMIN  3.6 3.2*   Recent Labs  Lab 06/26/24 0907  LIPASE 30   No results for input(s): AMMONIA in the last 168 hours.  ABG    Component Value Date/Time   PHART 7.414 01/01/2023 1716   PCO2ART 36.9 01/01/2023 1716   PO2ART 58 (L) 01/01/2023 1716   HCO3 23.7 01/01/2023 1716   TCO2 20 (L) 05/15/2024 1302   ACIDBASEDEF 1.0 01/01/2023 1716   O2SAT 90 01/01/2023 1716     Coagulation Profile: No results for input(s): INR, PROTIME in the last 168 hours.  Cardiac Enzymes: No results for input(s): CKTOTAL, CKMB, CKMBINDEX, TROPONINI in the last 168 hours.  HbA1C: Hgb A1c MFr Bld  Date/Time Value Ref Range Status  06/27/2024 03:03 AM 6.6 (H) 4.8 - 5.6 % Final    Comment:    (NOTE) Diagnosis of Diabetes The following HbA1c ranges recommended by the American Diabetes Association (ADA) may be used as an aid in the diagnosis of diabetes mellitus.  Hemoglobin             Suggested A1C NGSP%              Diagnosis  <5.7                   Non Diabetic  5.7-6.4                Pre-Diabetic  >6.4                   Diabetic  <7.0                   Glycemic control for                       adults with diabetes.    09/14/2015 04:37 PM 5.9 (H) <5.7 % Final    Comment:                                                                           According to the ADA Clinical Practice Recommendations for 2011, when HbA1c is used as a screening test:     >=6.5%   Diagnostic of Diabetes Mellitus            (if abnormal result is confirmed)   5.7-6.4%   Increased risk of developing Diabetes Mellitus   References:Diagnosis and Classification of Diabetes Mellitus,Diabetes Care,2011,34(Suppl 1):S62-S69 and Standards of Medical Care in  Diabetes - 2011,Diabetes Care,2011,34 (Suppl 1):S11-S61.       CBG: Recent Labs  Lab 06/26/24 0933  GLUCAP 137*    Review of Systems:   Please see the history of present illness. All other systems reviewed and are negative    Past Medical History:  He,  has a past medical history of AKI (acute kidney injury) (HCC) (06/29/2018), Anemia, Anemia, iron deficiency (09/18/2015), Bowel obstruction (HCC), BPH (benign prostatic hyperplasia), Chronic constipation, Chronic systolic heart failure (HCC), History of ileus, Ileus (HCC) (03/07/2017), Mental retardation, Partial bowel obstruction (HCC), Schizophrenia (HCC), SIRS (systemic inflammatory response syndrome) (HCC) (06/29/2018), and Small bowel obstruction (HCC) (06/29/2018).   Surgical History:  History reviewed. No pertinent surgical history.   Social History:   reports that he has quit smoking. His smoking use included cigarettes. He has a 7.5 pack-year smoking history. He has never used smokeless tobacco. He reports that he does not drink alcohol and does not use drugs.   Family History:  His family history includes Cancer in his mother.   Allergies No Known Allergies   Home Medications  Prior to Admission medications   Medication Sig Start Date End Date Taking? Authorizing Provider  amantadine  (SYMMETREL ) 100 MG capsule Take 100 mg by mouth at bedtime.     [provider]  aspirin  EC (ASPIRIN  EC ADULT LOW DOSE) 81 MG tablet TAKE 1 TABLET BY MOUTH EVERY DAY 02/07/24   [name] HERO, MD  cloZAPine  (CLOZARIL ) 100 MG tablet Take 2 tablets (200 mg total) by mouth 2 (two) times daily. 03/12/17   Sebastian Toribio GAILS, MD  cyanocobalamin  1000 MCG tablet Take 1 tablet (1,000 mcg total) by mouth daily. 06/18/24   Jillian Buttery, MD  dapagliflozin  propanediol (FARXIGA ) 10 MG TABS tablet TAKE 1 TABLET BY MOUTH EVERY DAY 02/07/24   [name] HERO, MD  finasteride  (PROSCAR ) 5 MG tablet Take 1 tablet (5 mg total) by mouth daily.  06/02/24 05/28/25  [name] HERO, MD  LINZESS  290 MCG CAPS capsule TAKE 1 CAPSULE BY MOUTH EVERY MORNING BEFORE BREAKFAST 05/21/24   [name] HERO, MD  Melatonin 10 MG SUBL Place 10 mg under the tongue at bedtime.    [provider]  midodrine  (PROAMATINE ) 10 MG tablet Take 1 tablet (10 mg total) by mouth 3 (three) times daily with meals. 06/17/24 08/16/24  Jillian Buttery, MD  omeprazole  (PRILOSEC) 40 MG capsule TAKE 1 CAPSULE BY MOUTH EVERY EVENING 02/07/24   [name] HERO, MD  PARoxetine  (PAXIL ) 40 MG tablet Take 40 mg by mouth every evening.     [provider]  tamsulosin  (FLOMAX ) 0.4 MG CAPS capsule Take 0.4 mg by mouth daily after supper. 01/10/21   [provider]     Critical care time:    n/a      Leita SAUNDERS Gleason, PA-C [facility] See Amion for pager If no response to pager , please call [phone] until 7pm After 7:00 pm call Elink  [phone]

## 2024-06-27 NOTE — Progress Notes (Signed)
 PROGRESS NOTE    Jordan Kennedy  FMW:[id number] DOB: 05/24/1956 DOA: 06/26/2024 PCP: [name] HERO, MD   Brief Narrative:  Jordan Kennedy is a 68 y.o. male with medical history significant of AKI, iron deficiency anemia, bowel obstruction, BPH, chronic constipation, chronic systolic heart failure, history of partial bowel obstruction, SBO, history of ileus, mental retardation, schizophrenia who presented to the emergency with complaints of constipation. Imaging confirms large stool volume in colon with questionable colitis. Hospitalist called for admission.   Assessment & Plan:   Principal Problem:   Sepsis due to undetermined organism [facility]) Active Problems:   Intellectual disability   Schizophrenia (HCC)   Thrombocytosis   Constipation due to slow transit   Tobacco use disorder, mild, in sustained remission   BPH with obstruction/lower urinary tract symptoms   Chronic HFrEF (heart failure with reduced ejection fraction) (HCC)   UTI (urinary tract infection)   Elevated troponin   Hyperglycemia   Glucose intolerance (impaired glucose tolerance)   Sepsis secondary to presumed UTI Rule out colitis Cefepime /flagyl  ongoing Follow-up blood culture and sensitivity (recent Ecoli/enterobacter UT - resistant to penicillin only Borderline hypotensive but not requiring pressors  Constipation due to slow transit Continue magnesium , miralax /sennakot BID  Elevated troponin Secondary to demand ischemia/hypotension Downtrending appropriately - no further evaluation indicated  Chronic HFrEF without acute exacerbation -last echo 7/24 - EF <20% with dyssynchrony and global hypokinesia -Update echo scheduled outpatient in 1 week - no indication for urgent/repeat echo at this time  Hypovolemic at intake - cautious fluids as needed Will hold Entresto , beta-blocker today   Hyperglycemia History of glucose intolerance (impaired glucose tolerance) Hemoglobin A1c 6.6    Intellectual  disability Schizophrenia (HCC) Continue current treatment pending med rec.   Thrombocytosis Monitor platelet count.  BPH with obstruction/lower urinary tract symptoms Foley exchanged at intake 06/26/24 Continue tamsulosin /finasteride  Patient to follow-up with urology as an outpatient.   Tobacco use disorder, mild, in sustained remission Continues to be in remission according to the patient.    DVT prophylaxis: enoxaparin  (LOVENOX ) injection 40 mg Start: 06/26/24 2200 Code Status:   Code Status: Full Code Family Communication: None present  Status is: Inpt  Dispo: The patient is from: Boarding house              Anticipated d/c is to: Same              Anticipated d/c date is: 24-48h              Patient currently not medically stable for discharge  Consultants:  None  Procedures:  none  Antimicrobials:  Cefepime /flagyl    Subjective: No acute issues/events overnight -patient denies nausea vomiting diarrhea headache fevers chills or chest pain.  No bowel movement noted patient continues to feel the urge for bowel movement  Objective: Vitals:   06/27/24 0345 06/27/24 0400 06/27/24 0500 06/27/24 0606  BP:  98/61 104/61 (!) 91/57  Pulse:  96 89   Resp:  17 18 18   Temp: 98.5 F (36.9 C)     TempSrc: Oral     SpO2:  99% 99%   Weight:      Height:        Intake/Output Summary (Last 24 hours) at 06/27/2024 0708 Last data filed at 06/27/2024 9388 Gross per 24 hour  Intake 2122.42 ml  Output 2750 ml  Net -627.58 ml   Filed Weights   06/26/24 0838 06/26/24 1629  Weight: 77.1 kg 77.5 kg    Examination:  General:  Pleasantly resting in bed, No acute distress. HEENT:  Normocephalic atraumatic.  Sclerae nonicteric, noninjected.  Extraocular movements intact bilaterally. Neck:  Without mass or deformity.  Trachea is midline. Lungs:  Clear to auscultate bilaterally without rhonchi, wheeze, or rales. Heart:  Regular rate and rhythm.  Without murmurs, rubs, or  gallops. Abdomen:  Soft, nontender, nondistended.  Without guarding or rebound. Extremities: Without cyanosis, clubbing, edema, or obvious deformity. Skin:  Warm and dry, no erythema.  Data Reviewed: I have personally reviewed following labs and imaging studies  CBC: Recent Labs  Lab 06/25/24 0901 06/26/24 0907 06/27/24 0303  WBC 12.3* 14.8* 12.2*  NEUTROABS 9.0* 11.0* 6.9  HGB 13.0 13.4 11.7*  HCT 40.4 43.4 37.6*  MCV 91.5 94.6 95.7  PLT 525.0* 501* 441*   Basic Metabolic Panel: Recent Labs  Lab 06/25/24 0901 06/26/24 0907 06/27/24 0303  NA 139 138 137  K 4.5 4.7 4.2  CL 103 103 103  CO2 25 20* 23  GLUCOSE 139* 174* 99  BUN 18 16 17   CREATININE 1.30 1.29* 1.07  CALCIUM  9.8 9.6 9.0   GFR: Estimated Creatinine Clearance: 75.4 mL/min (by C-G formula based on SCr of 1.07 mg/dL). Liver Function Tests: Recent Labs  Lab 06/26/24 0907 06/27/24 0303  AST 21 19  ALT 19 13  ALKPHOS 145* 131*  BILITOT 0.4 0.3  PROT 7.3 6.3*  ALBUMIN  3.6 3.2*   Recent Labs  Lab 06/26/24 0907  LIPASE 30   No results for input(s): AMMONIA in the last 168 hours. Coagulation Profile: No results for input(s): INR, PROTIME in the last 168 hours. Cardiac Enzymes: No results for input(s): CKTOTAL, CKMB, CKMBINDEX, TROPONINI in the last 168 hours. BNP (last 3 results) No results for input(s): PROBNP in the last 8760 hours. HbA1C: Recent Labs    06/27/24 0303  HGBA1C 6.6*   CBG: Recent Labs  Lab 06/26/24 0933  GLUCAP 137*   Lipid Profile: No results for input(s): CHOL, HDL, LDLCALC, TRIG, CHOLHDL, LDLDIRECT in the last 72 hours. Thyroid Function Tests: No results for input(s): TSH, T4TOTAL, FREET4, T3FREE, THYROIDAB in the last 72 hours. Anemia Panel: No results for input(s): VITAMINB12, FOLATE, FERRITIN, TIBC, IRON, RETICCTPCT in the last 72 hours. Sepsis Labs: Recent Labs  Lab 06/26/24 0945 06/26/24 1340  LATICACIDVEN  2.1* 1.4    Recent Results (from the past 240 hours)  MRSA Next Gen by PCR, Nasal     Status: None   Collection Time: 06/26/24  4:23 PM   Specimen: Nasal Mucosa; Nasal Swab  Result Value Ref Range Status   MRSA by PCR Next Gen NOT DETECTED NOT DETECTED Final    Comment: (NOTE) The GeneXpert MRSA Assay (FDA approved for NASAL specimens only), is one component of a comprehensive MRSA colonization surveillance program. It is not intended to diagnose MRSA infection nor to guide or monitor treatment for MRSA infections. Test performance is not FDA approved in patients less than 34 years old. Performed at [facility], [address] [place], [place] [address]          Radiology Studies: CT ABDOMEN PELVIS W CONTRAST Result Date: 06/26/2024 CLINICAL DATA:  Constipation and abdominal pain EXAM: CT ABDOMEN AND PELVIS WITH CONTRAST TECHNIQUE: Multidetector CT imaging of the abdomen and pelvis was performed using the standard protocol following bolus administration of intravenous contrast. RADIATION DOSE REDUCTION: This exam was performed according to the departmental dose-optimization program which includes automated exposure control, adjustment of the mA and/or kV according to  patient size and/or use of iterative reconstruction technique. CONTRAST:  OMNIPAQUE  IOHEXOL  300 MG/ML  SOLN COMPARISON:  CT abdomen and pelvis dated 06/09/2024 FINDINGS: Lower chest: Mild bilateral lower lobe bronchiectasis. No pleural effusion or pneumothorax demonstrated. Partially imaged heart size is normal. Hepatobiliary: No focal hepatic lesions. No intra or extrahepatic biliary ductal dilation. Normal gallbladder. Pancreas: No focal lesions or main ductal dilation. Spleen: Normal in size without focal abnormality. Adrenals/Urinary Tract: No adrenal nodules. Interval resolution of hydronephrosis. No renal calculi. Patchy hypoenhancement of the left upper and interpolar kidney. Decompressed  urinary bladder with catheter in-situ. Stomach/Bowel: Normal appearance of the stomach. Segmental mural thickening of the underdistended rectosigmoid colon. Large volume stool throughout the colon upstream of this area. Appendix is not discretely seen. Vascular/Lymphatic: Aortic atherosclerosis. No enlarged abdominal or pelvic lymph nodes. Reproductive: Enlargement of the prostate with median lobe hypertrophy. Other: Small volume free fluid.  No free air. Musculoskeletal: No acute or abnormal lytic or blastic osseous lesions. Multilevel degenerative changes of the partially imaged thoracic and lumbar spine. IMPRESSION: 1. Large volume stool throughout the colon in keeping with reported history of constipation. Segmental mural thickening of the underdistended rectosigmoid colon may be related to underdistention or underlying colitis. 2. Patchy hypoenhancement of the left upper and interpolar kidney, suspicious for pyelonephritis. 3. Interval resolution of hydronephrosis. 4. Persistent prostatomegaly. 5. Aortic Atherosclerosis (ICD10-I70.0). Electronically Signed   By: Limin  Xu M.D.   On: 06/26/2024 12:52   Scheduled Meds:  Chlorhexidine  Gluconate Cloth  6 each Topical Daily   dapagliflozin  propanediol  10 mg Oral Daily   enoxaparin  (LOVENOX ) injection  40 mg Subcutaneous Q24H   finasteride   5 mg Oral Daily   magnesium  oxide  400 mg Oral BID   midodrine   10 mg Oral TID WC   pantoprazole   40 mg Oral Daily   tamsulosin   0.4 mg Oral QPC supper   Continuous Infusions:  ceFEPime  (MAXIPIME ) IV Stopped (06/27/24 0600)   lactated ringers  150 mL/hr (06/27/24 9388)   metronidazole  Stopped (06/27/24 0006)     LOS: 1 day   Time spent:  Elsie JAYSON [place], DO [facility]  If 7PM-7AM, please contact night-coverage [email or web address]  06/27/2024, 7:08 AM

## 2024-06-28 DIAGNOSIS — A419 Sepsis, unspecified organism: Secondary | ICD-10-CM | POA: Diagnosis not present

## 2024-06-28 MED ORDER — POLYETHYLENE GLYCOL 3350 17 GM/SCOOP PO POWD: 17.0000 g | Freq: Two times a day (BID) | ORAL | 0 refills | Status: AC

## 2024-06-28 MED ORDER — CEPHALEXIN 500 MG PO CAPS: 500.0000 mg | ORAL_CAPSULE | Freq: Four times a day (QID) | ORAL | 0 refills | Status: DC

## 2024-06-28 NOTE — TOC Progression Note (Addendum)
 Transition of Care [facility]) - Progression Note    Patient Details  Name: Jordan Kennedy MRN: [id number] Date of Birth: May 25, 1956  Transition of Care [facility]) CM/SW Contact  Heather DELENA Saltness, LCSW Phone Number: 06/28/2024, 9:25 AM  Clinical Narrative:     ADDENDUM  9:34 AM - CSW attempted to speak with pt's legal guardian, Sherleen Ngo [phone], via phone call to discuss pt's discharge. No answer, voicemail left.  9:27 AM - Pt is medically stable for discharge today. CSW attempted to speak with pt's DSS social worker, [place] [phone], via phone call to discuss discharge. No answer, voicemail left requesting return phone call. TOC will continue to follow.   Expected Discharge Plan and Services  Group Home        Expected Discharge Date: 06/28/24                   Social Drivers of Health (SDOH) Interventions SDOH Screenings   Food Insecurity: Patient Declined (06/26/2024)  Housing: Patient Unable To Answer (06/26/2024)  Transportation Needs: Patient Unable To Answer (06/26/2024)  Utilities: Patient Unable To Answer (06/26/2024)  Alcohol Screen: Low Risk  (11/05/2023)  Depression (PHQ2-9): Low Risk  (11/05/2023)  Financial Resource Strain: Low Risk  (11/05/2023)  Physical Activity: Inactive (11/05/2023)  Social Connections: Patient Unable To Answer (06/26/2024)  Stress: No Stress Concern Present (11/05/2023)  Tobacco Use: Medium Risk (06/26/2024)  Health Literacy: Adequate Health Literacy (11/05/2023)    Readmission Risk Interventions    06/11/2024   10:27 AM  Readmission Risk Prevention Plan  Transportation Screening Complete  PCP or Specialist Appt within 3-5 Days Complete  HRI or Home Care Consult Complete  Social Work Consult for Recovery Care Planning/Counseling Complete  Palliative Care Screening Not Applicable  Medication Review Oceanographer) Complete    Signed: Heather Saltness, MSW, LCSW Clinical Social Worker Inpatient Care Management 06/28/2024 9:28 AM

## 2024-06-28 NOTE — Progress Notes (Signed)
 Patient is discharging and has been released to the care of his legal guardian. All PIVs have been removed. Patient was sent home with an indwelling foley per Md order. All belongings left the facility with patient. Patient transported Via wheelchair.   [name]

## 2024-06-28 NOTE — TOC Transition Note (Signed)
 Transition of Care [facility]) - Discharge Note   Patient Details  Name: Nicklous Aburto MRN: [id number] Date of Birth: 06-10-1956  Transition of [facility]) CM/SW Contact:  Heather DELENA Saltness, LCSW Phone Number: 06/28/2024, 11:43 AM   Clinical Narrative:    Pt discharging back to group home today. Pt to continue [facility] PT/OT services with [facility], confirmed with weekend representative, Glenda. ROC orders placed. Pt's Caregiver, Sherleen Ngo [phone], will pick pt up at 1 PM. No further TOC needs at this time.   Final next level of care: Home w Home Health Services Barriers to Discharge: Barriers Resolved   Patient Goals and CMS Choice Patient states their goals for this hospitalization and ongoing recovery are:: To return to Group Home         Discharge Placement  Group Home              Patient to be transferred to facility by: Group Home/Caregiver Name of family member notified: Pt's DSS SW, [name] Monroe Patient and family notified of of transfer: 06/28/24  Discharge Plan and Services Additional resources added to the After Visit Summary for  [facility]                DME Arranged: N/A DME Agency: NA       HH Arranged: PT, OT HH Agency: [facility] Date HH Agency Contacted: 06/28/24 Time HH Agency Contacted: 1142 Representative spoke with at [facility] Agency: [place], weekend representative  Social Drivers of Health (SDOH) Interventions SDOH Screenings   Food Insecurity: Patient Declined (06/26/2024)  Housing: Patient Unable To Answer (06/26/2024)  Transportation Needs: Patient Unable To Answer (06/26/2024)  Utilities: Patient Unable To Answer (06/26/2024)  Alcohol Screen: Low Risk  (11/05/2023)  Depression (PHQ2-9): Low Risk  (11/05/2023)  Financial Resource Strain: Low Risk  (11/05/2023)  Physical Activity: Inactive (11/05/2023)  Social Connections: Patient Unable To Answer (06/26/2024)  Stress: No Stress Concern Present (11/05/2023)  Tobacco Use: Medium Risk  (06/26/2024)  Health Literacy: Adequate Health Literacy (11/05/2023)     Readmission Risk Interventions    06/28/2024   11:41 AM 06/11/2024   10:27 AM  Readmission Risk Prevention Plan  Transportation Screening Complete Complete  PCP or Specialist Appt within 5-7 Days Complete   PCP or Specialist Appt within 3-5 Days  Complete  Home Care Screening Complete   Medication Review (RN CM) Complete   HRI or Home Care Consult  Complete  Social Work Consult for Recovery Care Planning/Counseling  Complete  Palliative Care Screening  Not Applicable  Medication Review Oceanographer)  Complete    Signed: Heather Saltness, MSW, LCSW Clinical Social Worker Inpatient Care Management 06/28/2024 11:58 AM

## 2024-06-28 NOTE — Discharge Summary (Signed)
 Physician Discharge Summary  Jordan Kennedy FMW:[id number] DOB: 20-Jan-1956 DOA: 06/26/2024  PCP: [name] HERO, MD  Admit date: 06/26/2024 Discharge date: 06/28/2024  Admitted From: Group home Disposition:  Same  Recommendations for Outpatient Follow-up:  Follow up with PCP in 1-2 weeks  Home Health:None  Equipment/Devices:None  Discharge Condition:Stable  CODE STATUS:Full  Diet recommendation:  Low salt low fat diet  Brief/Interim Summary: Jordan Kennedy is a 68 y.o. male with medical history significant of AKI, iron deficiency anemia, bowel obstruction, BPH, chronic constipation, chronic systolic heart failure, history of partial bowel obstruction, SBO, history of ileus, mental retardation, schizophrenia who presented to the emergency with complaints of constipation. Imaging confirms large stool volume in colon with questionable colitis. Hospitalist called for admission.  Patient improved on antibiotics with supportive care - foley exchanged at intake and noted large bowel movement yesterday evening. Now back to baseline and otherwise stable to DC back to group home for ongoing care.    Discharge Diagnoses:  Principal Problem:   Sepsis due to undetermined organism [facility]) Active Problems:   Intellectual disability   Schizophrenia (HCC)   Thrombocytosis   Constipation due to slow transit   Tobacco use disorder, mild, in sustained remission   BPH with obstruction/lower urinary tract symptoms   Chronic HFrEF (heart failure with reduced ejection fraction) (HCC)   UTI (urinary tract infection)   Elevated troponin   Hyperglycemia   Glucose intolerance (impaired glucose tolerance)  Sepsis secondary to presumed UTI Rule out colitis -Cephlex ongoing Follow-up blood/urine culture and sensitivity (currently negative - previous Ecoli/enterobacter UT - resistant to penicillin only Borderline hypotensive but not requiring pressors   Constipation due to slow transit Continue magnesium ,  miralax /sennakot BID   Elevated troponin, resolved Secondary to demand ischemia/hypotension Downtrending appropriately - no further evaluation indicated   Chronic HFrEF without acute exacerbation -last echo 7/24 - EF <20% with dyssynchrony and global hypokinesia -Update echo scheduled outpatient in 1 week - no indication for urgent/repeat echo at this time  -Resume entresto /metoprolol  - follow BP closely over the next week   Hyperglycemia History of glucose intolerance (impaired glucose tolerance) Hemoglobin A1c 6.6   Intellectual disability Schizophrenia (HCC) Continue current treatment   Thrombocytosis Monitor platelet count.   BPH with obstruction/lower urinary tract symptoms Foley exchanged at intake 06/26/24 Continue tamsulosin /finasteride  Patient to follow-up with urology as an outpatient.   Tobacco use disorder, mild, in sustained remission Continues to be in remission according to the patient.   Discharge Instructions  Discharge Instructions     Call MD for:  difficulty breathing, headache or visual disturbances   Complete by: As directed    Call MD for:  extreme fatigue   Complete by: As directed    Call MD for:  hives   Complete by: As directed    Call MD for:  persistant dizziness or light-headedness   Complete by: As directed    Call MD for:  persistant nausea and vomiting   Complete by: As directed    Call MD for:  severe uncontrolled pain   Complete by: As directed    Call MD for:  temperature >100.4   Complete by: As directed    Diet - low sodium heart healthy   Complete by: As directed    Increase activity slowly   Complete by: As directed       Allergies as of 06/28/2024   No Known Allergies      Medication List     TAKE these medications  amantadine  100 MG capsule Commonly known as: SYMMETREL  Take 100 mg by mouth at bedtime.   Aspirin  EC Adult Low Dose 81 MG tablet Generic drug: aspirin  EC TAKE 1 TABLET BY MOUTH EVERY DAY    B-12 1000 MCG Tabs Take 1 tablet (1,000 mcg total) by mouth daily.   cephALEXin  500 MG capsule Commonly known as: KEFLEX  Take 1 capsule (500 mg total) by mouth 4 (four) times daily for 3 days.   cloZAPine  100 MG tablet Commonly known as: CLOZARIL  Take 2 tablets (200 mg total) by mouth 2 (two) times daily.   Entresto  24-26 MG Generic drug: sacubitril -valsartan  Take 1 tablet by mouth 2 (two) times daily.   Farxiga  10 MG Tabs tablet Generic drug: dapagliflozin  propanediol TAKE 1 TABLET BY MOUTH EVERY DAY   finasteride  5 MG tablet Commonly known as: PROSCAR  Take 1 tablet (5 mg total) by mouth daily.   Linzess  290 MCG Caps capsule Generic drug: linaclotide  TAKE 1 CAPSULE BY MOUTH EVERY MORNING BEFORE BREAKFAST   Melatonin 10 MG Subl Place 10 mg under the tongue at bedtime.   metoCLOPramide  5 MG tablet Commonly known as: REGLAN  Take 5 mg by mouth 2 (two) times daily.   metoprolol  succinate 25 MG 24 hr tablet Commonly known as: TOPROL -XL Take 12.5 mg by mouth daily.   midodrine  10 MG tablet Commonly known as: PROAMATINE  Take 1 tablet (10 mg total) by mouth 3 (three) times daily with meals.   Myrbetriq  50 MG Tb24 tablet Generic drug: mirabegron  ER Take 50 mg by mouth at bedtime.   omeprazole  40 MG capsule Commonly known as: PRILOSEC TAKE 1 CAPSULE BY MOUTH EVERY EVENING   PARoxetine  40 MG tablet Commonly known as: PAXIL  Take 40 mg by mouth every evening.   polyethylene glycol powder 17 GM/SCOOP powder Commonly known as: GLYCOLAX /MIRALAX  Take 17 g by mouth 2 (two) times daily. Dissolve 1 capful (17g) in 4-8 ounces of liquid and take by mouth daily. What changed: See the new instructions.   Stimulant Laxative 8.6-50 MG tablet Generic drug: senna-docusate Take 1 tablet by mouth 2 (two) times daily.   tamsulosin  0.4 MG Caps capsule Commonly known as: FLOMAX  Take 0.4 mg by mouth daily after supper.        No Known  Allergies  Consultations: PCCM   Procedures/Studies: CT ABDOMEN PELVIS W CONTRAST Result Date: 06/26/2024 CLINICAL DATA:  Constipation and abdominal pain EXAM: CT ABDOMEN AND PELVIS WITH CONTRAST TECHNIQUE: Multidetector CT imaging of the abdomen and pelvis was performed using the standard protocol following bolus administration of intravenous contrast. RADIATION DOSE REDUCTION: This exam was performed according to the departmental dose-optimization program which includes automated exposure control, adjustment of the mA and/or kV according to patient size and/or use of iterative reconstruction technique. CONTRAST:  OMNIPAQUE  IOHEXOL  300 MG/ML  SOLN COMPARISON:  CT abdomen and pelvis dated 06/09/2024 FINDINGS: Lower chest: Mild bilateral lower lobe bronchiectasis. No pleural effusion or pneumothorax demonstrated. Partially imaged heart size is normal. Hepatobiliary: No focal hepatic lesions. No intra or extrahepatic biliary ductal dilation. Normal gallbladder. Pancreas: No focal lesions or main ductal dilation. Spleen: Normal in size without focal abnormality. Adrenals/Urinary Tract: No adrenal nodules. Interval resolution of hydronephrosis. No renal calculi. Patchy hypoenhancement of the left upper and interpolar kidney. Decompressed urinary bladder with catheter in-situ. Stomach/Bowel: Normal appearance of the stomach. Segmental mural thickening of the underdistended rectosigmoid colon. Large volume stool throughout the colon upstream of this area. Appendix is not discretely seen. Vascular/Lymphatic: Aortic atherosclerosis. No enlarged  abdominal or pelvic lymph nodes. Reproductive: Enlargement of the prostate with median lobe hypertrophy. Other: Small volume free fluid.  No free air. Musculoskeletal: No acute or abnormal lytic or blastic osseous lesions. Multilevel degenerative changes of the partially imaged thoracic and lumbar spine. IMPRESSION: 1. Large volume stool throughout the colon in keeping  with reported history of constipation. Segmental mural thickening of the underdistended rectosigmoid colon may be related to underdistention or underlying colitis. 2. Patchy hypoenhancement of the left upper and interpolar kidney, suspicious for pyelonephritis. 3. Interval resolution of hydronephrosis. 4. Persistent prostatomegaly. 5. Aortic Atherosclerosis (ICD10-I70.0). Electronically Signed   By: Limin  Xu M.D.   On: 06/26/2024 12:52   DG CHEST PORT 1 VIEW Result Date: 06/12/2024 CLINICAL DATA:  Shortness of breath.  Urinary tract infection. EXAM: PORTABLE CHEST 1 VIEW COMPARISON:  05/15/2024 FINDINGS: Chronic cardiomegaly. Improvement in previously seen interstitial and alveolar edema. Lungs are clear today except for minimal volume loss at the left base. IMPRESSION: Improvement in previously seen interstitial and alveolar edema. Minimal volume loss at the left base. Electronically Signed   By: Oneil Officer M.D.   On: 06/12/2024 09:26   CT ABDOMEN PELVIS WO CONTRAST Result Date: 06/09/2024 CLINICAL DATA:  abd distension abdominal pain x 1 day. Per report patient worsening confusion tonight. Per EMS initial BP was 80/50. LR Given by EMS. Patient report nausea, denies vomiting. EXAM: CT ABDOMEN AND PELVIS WITHOUT CONTRAST TECHNIQUE: Multidetector CT imaging of the abdomen and pelvis was performed following the standard protocol without IV contrast. RADIATION DOSE REDUCTION: This exam was performed according to the departmental dose-optimization program which includes automated exposure control, adjustment of the mA and/or kV according to patient size and/or use of iterative reconstruction technique. COMPARISON:  None Available. FINDINGS: Lower chest: Diffuse bronchial wall thickening. Bilateral trace, right greater than left, pleural effusions. Cardiomegaly. Hepatobiliary: No focal liver abnormality. No gallstones, gallbladder wall thickening, or pericholecystic fluid. No biliary dilatation. Pancreas:  No focal lesion. Normal pancreatic contour. No surrounding inflammatory changes. No main pancreatic ductal dilatation. Spleen: Normal in size without focal abnormality. Adrenals/Urinary Tract: No adrenal nodule bilaterally. Bilateral mild hydroureteronephrosis. Associated mild urothelial thickening. No nephroureterolithiasis bilaterally. The urinary bladder is fully decompressed with Foley tip and inflated balloon terminating within the urinary bladder lumen. Stomach/Bowel: Stomach is within normal limits. No evidence of bowel wall thickening or dilatation. Appendix appears normal. Vascular/Lymphatic: No abdominal aorta or iliac aneurysm. Mild atherosclerotic plaque of the aorta and its branches. No abdominal, pelvic, or inguinal lymphadenopathy. Reproductive: The prostate is enlarged measuring up to 5.7 cm. Other: No intraperitoneal free fluid. No intraperitoneal free gas. No organized fluid collection. Musculoskeletal: No abdominal wall hernia or abnormality. Diffuse subcutaneus soft tissue edema. No suspicious lytic or blastic osseous lesions. No acute displaced fracture. IMPRESSION: 1. Bilateral mild hydroureteronephrosis in the setting of a fully decompressed urinary bladder lumen. Associated mild urothelial thickening. This may reflect chronic changes of obstructive uropathy or reflux. Correlate with analysis for possible superimposed infection. 2. Cardiomegaly. 3. Bilateral trace, right greater than left, pleural effusions. 4. Subcutaneus soft tissue edema. 5.  Aortic Atherosclerosis (ICD10-I70.0). Electronically Signed   By: Morgane  Naveau M.D.   On: 06/09/2024 22:43   CT Head Wo Contrast Result Date: 06/09/2024 CLINICAL DATA:  Mental status change EXAM: CT HEAD WITHOUT CONTRAST TECHNIQUE: Contiguous axial images were obtained from the base of the skull through the vertex without intravenous contrast. RADIATION DOSE REDUCTION: This exam was performed according to the departmental dose-optimization  program  which includes automated exposure control, adjustment of the mA and/or kV according to patient size and/or use of iterative reconstruction technique. COMPARISON:  CT head 11/18/2009 FINDINGS: Brain: No intracranial hemorrhage, mass effect, or evidence of acute infarct. No hydrocephalus. No extra-axial fluid collection. Vascular: No hyperdense vessel or unexpected calcification. Skull: No fracture or focal lesion. Sinuses/Orbits: No acute finding. Other: None. IMPRESSION: No acute intracranial abnormality. Electronically Signed   By: Norman Gatlin M.D.   On: 06/09/2024 22:28     Subjective: No acute issues/events overnight   Discharge Exam: Vitals:   06/28/24 0800 06/28/24 0900  BP: 115/61 (!) 155/83  Pulse: 99 (!) 114  Resp: 17 (!) 29  Temp: 99 F (37.2 C)   SpO2: 99% 99%   Vitals:   06/28/24 0600 06/28/24 0700 06/28/24 0800 06/28/24 0900  BP: 115/69 (!) 114/58 115/61 (!) 155/83  Pulse: 97 98 99 (!) 114  Resp: 18 20 17  (!) 29  Temp:   99 F (37.2 C)   TempSrc:   Axillary   SpO2: 98% 98% 99% 99%  Weight:      Height:        General: Pt is alert, awake, not in acute distress Cardiovascular: RRR, S1/S2 +, no rubs, no gallops Respiratory: CTA bilaterally, no wheezing, no rhonchi Abdominal: Soft, NT, ND, bowel sounds + Extremities: no edema, no cyanosis    The results of significant diagnostics from this hospitalization (including imaging, microbiology, ancillary and laboratory) are listed below for reference.     Microbiology: Recent Results (from the past 240 hours)  MRSA Next Gen by PCR, Nasal     Status: None   Collection Time: 06/26/24  4:23 PM   Specimen: Nasal Mucosa; Nasal Swab  Result Value Ref Range Status   MRSA by PCR Next Gen NOT DETECTED NOT DETECTED Final    Comment: (NOTE) The GeneXpert MRSA Assay (FDA approved for NASAL specimens only), is one component of a comprehensive MRSA colonization surveillance program. It is not intended to diagnose  MRSA infection nor to guide or monitor treatment for MRSA infections. Test performance is not FDA approved in patients less than 60 years old. Performed at [facility], [address], [place], [place] [address]   Culture, blood (Routine X 2) w Reflex to ID Panel     Status: None (Preliminary result)   Collection Time: 06/26/24  5:32 PM   Specimen: BLOOD  Result Value Ref Range Status   Specimen Description   Final    BLOOD BLOOD LEFT ARM Performed at [facility], [address], [place], [place] [address]    Special Requests   Final    BOTTLES DRAWN AEROBIC AND ANAEROBIC Blood Culture adequate volume Performed at [facility], [address], [place], [place] [address]    Culture   Final    NO GROWTH 2 DAYS Performed at [facility], [address], [place], [place] [address]    Report Status PENDING  Incomplete  Culture, blood (Routine X 2) w Reflex to ID Panel     Status: None (Preliminary result)   Collection Time: 06/26/24  5:34 PM   Specimen: BLOOD  Result Value Ref Range Status   Specimen Description   Final    BLOOD BLOOD LEFT ARM Performed at [facility], [address], [place], [place] [address]    Special Requests   Final    BOTTLES DRAWN AEROBIC AND ANAEROBIC Blood Culture adequate  volume Performed at [facility], [address] [place], [place] [address]    Culture   Final    NO GROWTH 2 DAYS Performed at [facility], [address], [place], [place] [address]    Report Status PENDING  Incomplete  Remove and replace urinary cath (placed > 5 days) then obtain urine culture from new indwelling urinary catheter.     Status: None   Collection Time: 06/26/24  6:12 PM   Specimen: Urine, Catheterized  Result Value Ref Range Status   Specimen Description   Final    URINE, CATHETERIZED Performed at [facility], [address],  [place], [place] [address]    Special Requests   Final    NONE Performed at [facility], [address] [place], [place] [address]    Culture   Final    NO GROWTH Performed at [facility], [address], [place], [place] [address]    Report Status 06/27/2024 FINAL  Final     Labs: BNP (last 3 results) Recent Labs    06/10/24 0331 06/13/24 0243  BNP 2,208.7* 903.0*   Basic Metabolic Panel: Recent Labs  Lab 06/25/24 0901 06/26/24 0907 06/27/24 0303  NA 139 138 137  K 4.5 4.7 4.2  CL 103 103 103  CO2 25 20* 23  GLUCOSE 139* 174* 99  BUN 18 16 17   CREATININE 1.30 1.29* 1.07  CALCIUM  9.8 9.6 9.0   Liver Function Tests: Recent Labs  Lab 06/26/24 0907 06/27/24 0303  AST 21 19  ALT 19 13  ALKPHOS 145* 131*  BILITOT 0.4 0.3  PROT 7.3 6.3*  ALBUMIN  3.6 3.2*   Recent Labs  Lab 06/26/24 0907  LIPASE 30   No results for input(s): AMMONIA in the last 168 hours. CBC: Recent Labs  Lab 06/25/24 0901 06/26/24 0907 06/27/24 0303  WBC 12.3* 14.8* 12.2*  NEUTROABS 9.0* 11.0* 6.9  HGB 13.0 13.4 11.7*  HCT 40.4 43.4 37.6*  MCV 91.5 94.6 95.7  PLT 525.0* 501* 441*   Cardiac Enzymes: No results for input(s): CKTOTAL, CKMB, CKMBINDEX, TROPONINI in the last 168 hours. BNP: Invalid input(s): POCBNP CBG: Recent Labs  Lab 06/26/24 0933  GLUCAP 137*   D-Dimer No results for input(s): DDIMER in the last 72 hours. Hgb A1c Recent Labs    06/27/24 0303  HGBA1C 6.6*   Lipid Profile No results for input(s): CHOL, HDL, LDLCALC, TRIG, CHOLHDL, LDLDIRECT in the last 72 hours. Thyroid function studies No results for input(s): TSH, T4TOTAL, T3FREE, THYROIDAB in the last 72 hours.  Invalid input(s): FREET3 Anemia work up No results for input(s): VITAMINB12, FOLATE, FERRITIN, TIBC, IRON, RETICCTPCT in the last 72 hours. Urinalysis    Component Value Date/Time   COLORURINE YELLOW 06/26/2024 0907    APPEARANCEUR HAZY (A) 06/26/2024 0907   LABSPEC 1.013 06/26/2024 0907   PHURINE 7.0 06/26/2024 0907   GLUCOSEU >=500 (A) 06/26/2024 0907   HGBUR LARGE (A) 06/26/2024 0907   BILIRUBINUR NEGATIVE 06/26/2024 0907   BILIRUBINUR negative 11/25/2023 1027   BILIRUBINUR Negative 01/31/2015 1010   KETONESUR NEGATIVE 06/26/2024 0907   PROTEINUR 30 (A) 06/26/2024 0907   UROBILINOGEN 1.0 11/25/2023 1027   UROBILINOGEN 1.0 06/12/2012 1315   NITRITE NEGATIVE 06/26/2024 0907   LEUKOCYTESUR LARGE (A) 06/26/2024 0907   Sepsis Labs Recent Labs  Lab 06/25/24 0901 06/26/24 0907 06/27/24 0303  WBC 12.3* 14.8* 12.2*   Microbiology Recent Results (from the past 240  hours)  MRSA Next Gen by PCR, Nasal     Status: None   Collection Time: 06/26/24  4:23 PM   Specimen: Nasal Mucosa; Nasal Swab  Result Value Ref Range Status   MRSA by PCR Next Gen NOT DETECTED NOT DETECTED Final    Comment: (NOTE) The GeneXpert MRSA Assay (FDA approved for NASAL specimens only), is one component of a comprehensive MRSA colonization surveillance program. It is not intended to diagnose MRSA infection nor to guide or monitor treatment for MRSA infections. Test performance is not FDA approved in patients less than 60 years old. Performed at [facility], [address], [place], [place] [address]   Culture, blood (Routine X 2) w Reflex to ID Panel     Status: None (Preliminary result)   Collection Time: 06/26/24  5:32 PM   Specimen: BLOOD  Result Value Ref Range Status   Specimen Description   Final    BLOOD BLOOD LEFT ARM Performed at [facility], [address], [place], [place] [address]    Special Requests   Final    BOTTLES DRAWN AEROBIC AND ANAEROBIC Blood Culture adequate volume Performed at [facility], [address], [place], [place] [address]    Culture   Final    NO GROWTH 2 DAYS Performed at [facility], [address]  [place], [place] [address]    Report Status PENDING  Incomplete  Culture, blood (Routine X 2) w Reflex to ID Panel     Status: None (Preliminary result)   Collection Time: 06/26/24  5:34 PM   Specimen: BLOOD  Result Value Ref Range Status   Specimen Description   Final    BLOOD BLOOD LEFT ARM Performed at [facility], [address], [place], [place] [address]    Special Requests   Final    BOTTLES DRAWN AEROBIC AND ANAEROBIC Blood Culture adequate volume Performed at [facility], [address], [place], [place] [address]    Culture   Final    NO GROWTH 2 DAYS Performed at [facility], [address] [place], [place] [address]    Report Status PENDING  Incomplete  Remove and replace urinary cath (placed > 5 days) then obtain urine culture from new indwelling urinary catheter.     Status: None   Collection Time: 06/26/24  6:12 PM   Specimen: Urine, Catheterized  Result Value Ref Range Status   Specimen Description   Final    URINE, CATHETERIZED Performed at [facility], [address] [place], [place] [address]    Special Requests   Final    NONE Performed at [facility], [address], [place], [place] [address]    Culture   Final    NO GROWTH Performed at [facility], [address], [place], [place] [address]    Report Status 06/27/2024 FINAL  Final     Time coordinating discharge: Over 30 minutes  SIGNED:   Elsie JAYSON Montclair, DO [facility] 06/28/2024, 11:35 AM Pager   If 7PM-7AM, please contact night-coverage [email or web address]

## 2024-06-29 ENCOUNTER — Telehealth: Payer: Self-pay

## 2024-06-29 NOTE — Transitions of Care (Post Inpatient/ED Visit) (Signed)
   06/29/2024  Name: Dayven Linsley MRN: [id number] DOB: 16-May-1956  Today's TOC FU Call Status: Today's TOC FU Call Status:: Unsuccessful Call (1st Attempt) Unsuccessful Call (1st Attempt) Date: 06/29/24  Attempted to reach the patient regarding the most recent Inpatient/ED visit. Left a voicemail for [facility] listed as legal guardian and requested a return call for updated information.  Follow Up Plan: Additional outreach attempts will be made to reach the patient to complete the Transitions of Care (Post Inpatient/ED visit) call.   Richerd Fish, RN, BSN, CCM [facility], [facility] RN Care Manager Direct Dial: [phone]

## 2024-06-30 ENCOUNTER — Other Ambulatory Visit: Payer: Self-pay

## 2024-06-30 ENCOUNTER — Telehealth: Payer: Self-pay | Admitting: Family Medicine

## 2024-06-30 ENCOUNTER — Emergency Department (HOSPITAL_COMMUNITY)
Admission: EM | Admit: 2024-06-30 | Discharge: 2024-06-30 | Disposition: A | Discharge status: Home / Self Care | Source: Home / Self Care | Attending: Emergency Medicine | Admitting: Emergency Medicine

## 2024-06-30 DIAGNOSIS — Y732 Prosthetic and other implants, materials and accessory gastroenterology and urology devices associated with adverse incidents: Secondary | ICD-10-CM | POA: Insufficient documentation

## 2024-06-30 DIAGNOSIS — A419 Sepsis, unspecified organism: Secondary | ICD-10-CM | POA: Diagnosis not present

## 2024-06-30 DIAGNOSIS — T83098A Other mechanical complication of other indwelling urethral catheter, initial encounter: Secondary | ICD-10-CM | POA: Insufficient documentation

## 2024-06-30 DIAGNOSIS — Z7982 Long term (current) use of aspirin: Secondary | ICD-10-CM | POA: Insufficient documentation

## 2024-06-30 DIAGNOSIS — T83011A Breakdown (mechanical) of indwelling urethral catheter, initial encounter: Secondary | ICD-10-CM

## 2024-06-30 NOTE — ED Provider Notes (Signed)
 [place] EMERGENCY DEPARTMENT AT [facility] Provider Note  CSN: [id number] Arrival date & time: 06/30/24 9692  Chief Complaint(s) Abdominal Pain  HPI Jordan Kennedy is a 68 y.o. male with a past medical history listed below including schizophrenia, recent bowel obstruction and urinary retention with pyelonephritis requiring Foley catheter currently on Keflex  here for clogged Foley.  Patient lives in a group home and accompanied by the care provider who reports emptying his bag multiple times during the day including middle of the night.  Reports that when going to empty the Foley bag this evening, he noted very little amount of urine in the bag.  Patient was complaining of abdominal discomfort and distention at that time prompting his visit.  No other physical complaints.  The history is provided by the patient and a caregiver.    Past Medical History Past Medical History:  Diagnosis Date   AKI (acute kidney injury) (HCC) 06/29/2018   Anemia    Anemia, iron deficiency 09/18/2015   Bowel obstruction (HCC)    BPH (benign prostatic hyperplasia)    Chronic constipation    Chronic systolic heart failure (HCC)    History of ileus    Ileus (HCC) 03/07/2017   Mental retardation    Partial bowel obstruction (HCC)    Schizophrenia (HCC)    Monarche every three months;    SIRS (systemic inflammatory response syndrome) (HCC) 06/29/2018   Small bowel obstruction (HCC) 06/29/2018   Patient Active Problem List   Diagnosis Date Noted   Sepsis due to undetermined organism (HCC) 06/26/2024   Elevated troponin 06/26/2024   Hyperglycemia 06/26/2024   Glucose intolerance (impaired glucose tolerance) 06/26/2024   UTI (urinary tract infection) 06/10/2024   Acute metabolic encephalopathy 06/10/2024   Hyponatremia 06/10/2024   Hydronephrosis due to obstruction of bladder 06/02/2024   Obstructive nephropathy due to benign prostatic hyperplasia 06/02/2024   Bladder outlet obstruction  05/15/2024   Enuresis 06/19/2023   Left bundle branch block 02/13/2023   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   Tinea pedis of both feet 01/11/2023   Chronic HFrEF (heart failure with reduced ejection fraction) (HCC) 01/03/2023   Prolonged QT interval 01/01/2023   Transaminitis 01/01/2023   Thrombocytosis 01/01/2023   Tardive dyskinesia 11/20/2021   BPH with obstruction/lower urinary tract symptoms 05/02/2021   History of small bowel obstruction 01/31/2021   History of colon polyps 01/31/2021   Onychomycosis 01/31/2021   Gastroesophageal reflux disease 01/31/2021   AKI (acute kidney injury) (HCC) 06/29/2018   Tobacco use disorder, mild, in sustained remission 02/01/2015   Constipation due to slow transit 04/25/2012   Anxiety and depression 04/25/2012   Edentulous 04/25/2012   Schizophrenia (HCC)    Intellectual disability    Home Medication(s) Prior to Admission medications   Medication Sig Start Date End Date Taking? Authorizing Provider  amantadine  (SYMMETREL ) 100 MG capsule Take 100 mg by mouth at bedtime.     [provider]  aspirin  EC (ASPIRIN  EC ADULT LOW DOSE) 81 MG tablet TAKE 1 TABLET BY MOUTH EVERY DAY 02/07/24   [name] HERO, MD  cephALEXin  (KEFLEX ) 500 MG capsule Take 1 capsule (500 mg total) by mouth 4 (four) times daily for 3 days. 06/28/24 07/01/24  Lue Elsie BROCKS, MD  cloZAPine  (CLOZARIL ) 100 MG tablet Take 2 tablets (200 mg total) by mouth 2 (two) times daily. 03/12/17   Sebastian Toribio GAILS, MD  cyanocobalamin  1000 MCG tablet Take 1 tablet (1,000 mcg total)  by mouth daily. 06/18/24   Jillian Buttery, MD  dapagliflozin  propanediol (FARXIGA ) 10 MG TABS tablet TAKE 1 TABLET BY MOUTH EVERY DAY 02/07/24   [name] HERO, MD  ENTRESTO  24-26 MG Take 1 tablet by mouth 2 (two) times daily. 06/23/24   [provider]  finasteride  (PROSCAR ) 5 MG tablet Take 1 tablet (5 mg total) by mouth daily. 06/02/24 05/28/25  [name] HERO, MD  LINZESS  290 MCG CAPS capsule TAKE 1 CAPSULE BY MOUTH EVERY MORNING BEFORE BREAKFAST 05/21/24   [name] HERO, MD  Melatonin 10 MG SUBL Place 10 mg under the tongue at bedtime.    [provider]  metoCLOPramide  (REGLAN ) 5 MG tablet Take 5 mg by mouth 2 (two) times daily. 06/23/24   [provider]  metoprolol  succinate (TOPROL -XL) 25 MG 24 hr tablet Take 12.5 mg by mouth daily. 06/23/24   [provider]  midodrine  (PROAMATINE ) 10 MG tablet Take 1 tablet (10 mg total) by mouth 3 (three) times daily with meals. 06/17/24 08/16/24  Jillian Buttery, MD  MYRBETRIQ  50 MG TB24 tablet Take 50 mg by mouth at bedtime. 06/23/24   [provider]  omeprazole  (PRILOSEC) 40 MG capsule TAKE 1 CAPSULE BY MOUTH EVERY EVENING 02/07/24   [name] HERO, MD  PARoxetine  (PAXIL ) 40 MG tablet Take 40 mg by mouth every evening.     [provider]  polyethylene glycol powder (GLYCOLAX /MIRALAX ) 17 GM/SCOOP powder Take 17 g by mouth 2 (two) times daily. Dissolve 1 capful (17g) in 4-8 ounces of liquid and take by mouth daily. 06/28/24 09/26/24  Lue Elsie BROCKS, MD  STIMULANT LAXATIVE 8.6-50 MG tablet Take 1 tablet by mouth 2 (two) times daily. 06/23/24   [provider]  tamsulosin  (FLOMAX ) 0.4 MG CAPS capsule Take 0.4 mg by mouth daily after supper. 01/10/21   [provider]                                                                                                                                    Allergies Patient has no known allergies.  Review of Systems Review of Systems As noted in HPI  Physical Exam Vital Signs  I have reviewed the triage vital signs BP 91/63 (BP Location: Right Arm)   Pulse (!) 103   Temp 98.2 F (36.8 C) (Oral)   Resp 20   SpO2 100%   Physical Exam Vitals reviewed.  Constitutional:      General: He is not in acute distress.    Appearance: He is well-developed. He is not diaphoretic.  HENT:     Head:  Normocephalic and atraumatic.     Right Ear: External ear normal.     Left Ear: External ear normal.     Nose: Nose normal.     Mouth/Throat:     Mouth: Mucous membranes are moist.  Eyes:     General: No scleral icterus.  Conjunctiva/sclera: Conjunctivae normal.  Neck:     Trachea: Phonation normal.  Cardiovascular:     Rate and Rhythm: Normal rate and regular rhythm.  Pulmonary:     Effort: Pulmonary effort is normal. No respiratory distress.     Breath sounds: No stridor.  Abdominal:     General: There is distension (suprapubic).     Tenderness: There is abdominal tenderness in the suprapubic area. There is no guarding or rebound.  Musculoskeletal:        General: Normal range of motion.     Cervical back: Normal range of motion.  Neurological:     Mental Status: He is alert and oriented to person, place, and time.  Psychiatric:        Behavior: Behavior normal.     ED Results and Treatments Labs (all labs ordered are listed, but only abnormal results are displayed) Labs Reviewed - No data to display                                                                                                                       EKG  EKG Interpretation Date/Time:    Ventricular Rate:    PR Interval:    QRS Duration:    QT Interval:    QTC Calculation:   R Axis:      Text Interpretation:         Radiology No results found.  Medications Ordered in ED Medications - No data to display Procedures Procedures  (including critical care time) Medical Decision Making / ED Course   Medical Decision Making Amount and/or Complexity of Data Reviewed External Data Reviewed: notes.    Details: Recent admission note reviewed    Patient presents for clogged Foley and noted to have bladder distention.  Bladder scan notable for almost 1000 cc retained.  Foley catheter flushed but unable to drain much.  Foley exchange and flowing well  Patient is only on day 2 of antibiotics.  No  need for to repeat UA at this time.    Final Clinical Impression(s) / ED Diagnoses Final diagnoses:  Malfunction of Foley catheter, initial encounter [facility])   The patient appears reasonably screened and/or stabilized for discharge and I doubt any other medical condition or other [facility] requiring further screening, evaluation, or treatment in the ED at this time. I have discussed the findings, Dx and Tx plan with the patient/family who expressed understanding and agree(s) with the plan. Discharge instructions discussed at length. The patient/family was given strict return precautions who verbalized understanding of the instructions. No further questions at time of discharge.  Disposition: Discharge  Condition: Good  ED Discharge Orders     None         Follow Up: [name] HERO, MD [address] [place] [address] [phone]  Call  to schedule an appointment for close follow up  Pa, [facility] [address] [place] [address] [place] [address] [phone]  Call  to  schedule an appointment for close follow up    This chart was dictated using voice recognition software.  Despite best efforts to proofread,  errors can occur which can change the documentation meaning.    Trine Raynell Moder, MD 06/30/24 [phone]

## 2024-06-30 NOTE — ED Notes (Addendum)
 Bladder scanned pt and pt has >999 mL per the scan.

## 2024-06-30 NOTE — ED Notes (Signed)
 Foley catheter changed out. Urine returned noted with clots. Initial urine red in color but became more yellow as continued to drain.

## 2024-06-30 NOTE — ED Triage Notes (Signed)
 PT's family states that the PT's catheter not draining properly

## 2024-06-30 NOTE — Telephone Encounter (Signed)
 Patient declined an appointment today to resume home health care and is schedued for tomorrow. Dm/cma

## 2024-06-30 NOTE — Telephone Encounter (Signed)
 Copied from CRM [id number]. Topic: General - Call Back - No Documentation >> Jun 30, 2024 12:42 PM Leah C wrote: Reason for CRM: ida from home health called and stated that patient wants to postpone home health care.   [facility]  [phone] (cell)

## 2024-07-01 ENCOUNTER — Other Ambulatory Visit: Payer: Self-pay

## 2024-07-01 ENCOUNTER — Ambulatory Visit (INDEPENDENT_AMBULATORY_CARE_PROVIDER_SITE_OTHER): Admitting: Emergency Medicine

## 2024-07-01 ENCOUNTER — Emergency Department (HOSPITAL_COMMUNITY)

## 2024-07-01 ENCOUNTER — Encounter (HOSPITAL_COMMUNITY): Payer: Self-pay

## 2024-07-01 ENCOUNTER — Telehealth: Payer: Self-pay

## 2024-07-01 ENCOUNTER — Encounter: Payer: Self-pay | Admitting: Emergency Medicine

## 2024-07-01 ENCOUNTER — Inpatient Hospital Stay (HOSPITAL_COMMUNITY)
Admission: EM | Admit: 2024-07-01 | Discharge: 2024-07-11 | DRG: 871 | Disposition: A | Discharge status: Skilled Nursing Facility | Source: Skilled Nursing Facility | Attending: Hospitalist | Admitting: Hospitalist

## 2024-07-01 VITALS — BP 61/41 | HR 115 | Ht 77.0 in | Wt 173.2 lb

## 2024-07-01 DIAGNOSIS — R7302 Impaired glucose tolerance (oral): Secondary | ICD-10-CM | POA: Diagnosis present

## 2024-07-01 DIAGNOSIS — E860 Dehydration: Secondary | ICD-10-CM | POA: Diagnosis present

## 2024-07-01 DIAGNOSIS — Z66 Do not resuscitate: Secondary | ICD-10-CM | POA: Diagnosis present

## 2024-07-01 DIAGNOSIS — N390 Urinary tract infection, site not specified: Secondary | ICD-10-CM | POA: Diagnosis present

## 2024-07-01 DIAGNOSIS — I493 Ventricular premature depolarization: Secondary | ICD-10-CM | POA: Diagnosis present

## 2024-07-01 DIAGNOSIS — N3289 Other specified disorders of bladder: Secondary | ICD-10-CM | POA: Diagnosis present

## 2024-07-01 DIAGNOSIS — Z87891 Personal history of nicotine dependence: Secondary | ICD-10-CM | POA: Diagnosis not present

## 2024-07-01 DIAGNOSIS — K219 Gastro-esophageal reflux disease without esophagitis: Secondary | ICD-10-CM | POA: Diagnosis present

## 2024-07-01 DIAGNOSIS — Z85819 Personal history of malignant neoplasm of unspecified site of lip, oral cavity, and pharynx: Secondary | ICD-10-CM | POA: Diagnosis not present

## 2024-07-01 DIAGNOSIS — K5900 Constipation, unspecified: Secondary | ICD-10-CM | POA: Diagnosis present

## 2024-07-01 DIAGNOSIS — I472 Ventricular tachycardia, unspecified: Secondary | ICD-10-CM | POA: Diagnosis not present

## 2024-07-01 DIAGNOSIS — I5084 End stage heart failure: Secondary | ICD-10-CM | POA: Diagnosis present

## 2024-07-01 DIAGNOSIS — I959 Hypotension, unspecified: Secondary | ICD-10-CM | POA: Diagnosis not present

## 2024-07-01 DIAGNOSIS — N320 Bladder-neck obstruction: Secondary | ICD-10-CM | POA: Diagnosis present

## 2024-07-01 DIAGNOSIS — Z7982 Long term (current) use of aspirin: Secondary | ICD-10-CM

## 2024-07-01 DIAGNOSIS — E875 Hyperkalemia: Secondary | ICD-10-CM | POA: Diagnosis present

## 2024-07-01 DIAGNOSIS — Z7189 Other specified counseling: Secondary | ICD-10-CM | POA: Diagnosis not present

## 2024-07-01 DIAGNOSIS — R6521 Severe sepsis with septic shock: Secondary | ICD-10-CM | POA: Diagnosis present

## 2024-07-01 DIAGNOSIS — I509 Heart failure, unspecified: Secondary | ICD-10-CM | POA: Diagnosis not present

## 2024-07-01 DIAGNOSIS — E861 Hypovolemia: Secondary | ICD-10-CM | POA: Diagnosis present

## 2024-07-01 DIAGNOSIS — Z79899 Other long term (current) drug therapy: Secondary | ICD-10-CM | POA: Diagnosis not present

## 2024-07-01 DIAGNOSIS — I447 Left bundle-branch block, unspecified: Secondary | ICD-10-CM | POA: Diagnosis present

## 2024-07-01 DIAGNOSIS — Z1152 Encounter for screening for COVID-19: Secondary | ICD-10-CM | POA: Diagnosis not present

## 2024-07-01 DIAGNOSIS — G2401 Drug induced subacute dyskinesia: Secondary | ICD-10-CM | POA: Diagnosis present

## 2024-07-01 DIAGNOSIS — F209 Schizophrenia, unspecified: Secondary | ICD-10-CM | POA: Diagnosis present

## 2024-07-01 DIAGNOSIS — I5022 Chronic systolic (congestive) heart failure: Secondary | ICD-10-CM | POA: Diagnosis present

## 2024-07-01 DIAGNOSIS — D72829 Elevated white blood cell count, unspecified: Secondary | ICD-10-CM

## 2024-07-01 DIAGNOSIS — Z809 Family history of malignant neoplasm, unspecified: Secondary | ICD-10-CM | POA: Diagnosis not present

## 2024-07-01 DIAGNOSIS — A419 Sepsis, unspecified organism: Secondary | ICD-10-CM | POA: Diagnosis present

## 2024-07-01 DIAGNOSIS — A4150 Gram-negative sepsis, unspecified: Secondary | ICD-10-CM | POA: Diagnosis not present

## 2024-07-01 DIAGNOSIS — F70 Mild intellectual disabilities: Secondary | ICD-10-CM | POA: Diagnosis present

## 2024-07-01 DIAGNOSIS — N401 Enlarged prostate with lower urinary tract symptoms: Secondary | ICD-10-CM | POA: Diagnosis present

## 2024-07-01 DIAGNOSIS — Z515 Encounter for palliative care: Secondary | ICD-10-CM | POA: Diagnosis not present

## 2024-07-01 DIAGNOSIS — I420 Dilated cardiomyopathy: Secondary | ICD-10-CM | POA: Diagnosis not present

## 2024-07-01 LAB — COMPREHENSIVE METABOLIC PANEL WITH GFR
ALT: 21 U/L (ref 0–44)
AST: 24 U/L (ref 15–41)
Albumin: 2.6 g/dL — ABNORMAL LOW (ref 3.5–5.0)
Alkaline Phosphatase: 86 U/L (ref 38–126)
Anion gap: 5 (ref 5–15)
BUN: 15 mg/dL (ref 8–23)
CO2: 23 mmol/L (ref 22–32)
Calcium: 8.8 mg/dL — ABNORMAL LOW (ref 8.9–10.3)
Chloride: 111 mmol/L (ref 98–111)
Creatinine, Ser: 1.27 mg/dL — ABNORMAL HIGH (ref 0.61–1.24)
GFR, Estimated: >60 mL/min (ref >60)
Glucose, Bld: 125 mg/dL — ABNORMAL HIGH (ref 70–99)
Potassium: 4.5 mmol/L (ref 3.5–5.1)
Sodium: 139 mmol/L (ref 135–145)
Total Bilirubin: 0.6 mg/dL (ref 0.0–1.2)
Total Protein: 6.2 g/dL — ABNORMAL LOW (ref 6.5–8.1)

## 2024-07-01 LAB — I-STAT CHEM 8, ED
BUN: 17 mg/dL (ref 8–23)
Calcium, Ion: 1.17 mmol/L (ref 1.15–1.40)
Chloride: 110 mmol/L (ref 98–111)
Creatinine, Ser: 1.3 mg/dL — ABNORMAL HIGH (ref 0.61–1.24)
Glucose, Bld: 126 mg/dL — ABNORMAL HIGH (ref 70–99)
HCT: 37 % — ABNORMAL LOW (ref 39.0–52.0)
Hemoglobin: 12.6 g/dL — ABNORMAL LOW (ref 13.0–17.0)
Potassium: 4.6 mmol/L (ref 3.5–5.1)
Sodium: 143 mmol/L (ref 135–145)
TCO2: 22 mmol/L (ref 22–32)

## 2024-07-01 LAB — CBC WITH DIFFERENTIAL/PLATELET
Abs Immature Granulocytes: 0.07 K/uL (ref 0.00–0.07)
Basophils Absolute: 0.1 K/uL (ref 0.0–0.1)
Basophils Relative: 1 %
Eosinophils Absolute: 0.3 K/uL (ref 0.0–0.5)
Eosinophils Relative: 2 %
HCT: 35.8 % — ABNORMAL LOW (ref 39.0–52.0)
Hemoglobin: 11.5 g/dL — ABNORMAL LOW (ref 13.0–17.0)
Immature Granulocytes: 0 %
Lymphocytes Relative: 13 %
Lymphs Abs: 2 K/uL (ref 0.7–4.0)
MCH: 29.9 pg (ref 26.0–34.0)
MCHC: 32.1 g/dL (ref 30.0–36.0)
MCV: 93 fL (ref 80.0–100.0)
Monocytes Absolute: 1.4 K/uL — ABNORMAL HIGH (ref 0.1–1.0)
Monocytes Relative: 9 %
Neutro Abs: 12.1 K/uL — ABNORMAL HIGH (ref 1.7–7.7)
Neutrophils Relative %: 75 %
Platelets: 408 K/uL — ABNORMAL HIGH (ref 150–400)
RBC: 3.85 MIL/uL — ABNORMAL LOW (ref 4.22–5.81)
RDW: 17.8 % — ABNORMAL HIGH (ref 11.5–15.5)
WBC: 15.9 K/uL — ABNORMAL HIGH (ref 4.0–10.5)
nRBC: 0 % (ref 0.0–0.2)

## 2024-07-01 LAB — CULTURE, BLOOD (ROUTINE X 2)
Culture: NO GROWTH
Culture: NO GROWTH
Special Requests: ADEQUATE
Special Requests: ADEQUATE

## 2024-07-01 LAB — URINALYSIS, W/ REFLEX TO CULTURE (INFECTION SUSPECTED)
Bacteria, UA: NONE SEEN
Bilirubin Urine: NEGATIVE
Glucose, UA: >=500 mg/dL — AB
Ketones, ur: NEGATIVE mg/dL
Nitrite: NEGATIVE
Protein, ur: 30 mg/dL — AB
Specific Gravity, Urine: 1.023 (ref 1.005–1.030)
pH: 5 (ref 5.0–8.0)

## 2024-07-01 LAB — RESP PANEL BY RT-PCR (RSV, FLU A&B, COVID)  RVPGX2
Influenza A by PCR: NEGATIVE
Influenza B by PCR: NEGATIVE
Resp Syncytial Virus by PCR: NEGATIVE
SARS Coronavirus 2 by RT PCR: NEGATIVE

## 2024-07-01 LAB — PROTIME-INR
INR: 1.2 (ref 0.8–1.2)
Prothrombin Time: 15.4 s — ABNORMAL HIGH (ref 11.4–15.2)

## 2024-07-01 LAB — I-STAT CG4 LACTIC ACID, ED: Lactic Acid, Venous: 1.7 mmol/L (ref 0.5–1.9)

## 2024-07-01 MED ORDER — FINASTERIDE 5 MG PO TABS
5.0000 mg | ORAL_TABLET | Freq: Every day | ORAL | Status: DC
  Administered 2024-07-02 – 2024-07-11 (×10): 5 mg via ORAL
  Filled 2024-07-01 (×10): qty 1

## 2024-07-01 MED ORDER — PIPERACILLIN-TAZOBACTAM 3.375 G IVPB 30 MIN
3.3750 g | Freq: Once | INTRAVENOUS | Status: AC
  Administered 2024-07-01: 3.375 g via INTRAVENOUS
  Filled 2024-07-01: qty 50

## 2024-07-01 MED ORDER — SODIUM CHLORIDE 0.9 % IV SOLN
250.0000 mL | INTRAVENOUS | Status: AC
  Administered 2024-07-02: 250 mL via INTRAVENOUS

## 2024-07-01 MED ORDER — LINACLOTIDE 145 MCG PO CAPS
290.0000 ug | ORAL_CAPSULE | Freq: Every day | ORAL | Status: DC
  Administered 2024-07-02 – 2024-07-11 (×10): 290 ug via ORAL
  Filled 2024-07-01 (×11): qty 2

## 2024-07-01 MED ORDER — TAMSULOSIN HCL 0.4 MG PO CAPS
0.4000 mg | ORAL_CAPSULE | Freq: Every day | ORAL | Status: DC
  Administered 2024-07-01 – 2024-07-10 (×10): 0.4 mg via ORAL
  Filled 2024-07-01 (×10): qty 1

## 2024-07-01 MED ORDER — LACTATED RINGERS IV BOLUS (SEPSIS)
1000.0000 mL | Freq: Once | INTRAVENOUS | Status: AC
  Administered 2024-07-01: 1000 mL via INTRAVENOUS

## 2024-07-01 MED ORDER — ASPIRIN 81 MG PO TBEC
81.0000 mg | DELAYED_RELEASE_TABLET | Freq: Every day | ORAL | Status: DC
Start: 2024-07-02 — End: 2024-07-09
  Administered 2024-07-02 – 2024-07-08 (×7): 81 mg via ORAL
  Filled 2024-07-01 (×7): qty 1

## 2024-07-01 MED ORDER — METOPROLOL SUCCINATE ER 25 MG PO TB24
12.5000 mg | ORAL_TABLET | Freq: Every day | ORAL | Status: DC
Start: 2024-07-02 — End: 2024-07-01

## 2024-07-01 MED ORDER — MIDODRINE HCL 5 MG PO TABS
10.0000 mg | ORAL_TABLET | Freq: Three times a day (TID) | ORAL | Status: DC
  Administered 2024-07-01 – 2024-07-11 (×29): 10 mg via ORAL
  Filled 2024-07-01 (×29): qty 2

## 2024-07-01 MED ORDER — PANTOPRAZOLE SODIUM 40 MG PO TBEC
40.0000 mg | DELAYED_RELEASE_TABLET | Freq: Every day | ORAL | Status: DC
  Administered 2024-07-02 – 2024-07-11 (×10): 40 mg via ORAL
  Filled 2024-07-01 (×10): qty 1

## 2024-07-01 MED ORDER — SODIUM CHLORIDE 0.9 % IV BOLUS
1000.0000 mL | Freq: Once | INTRAVENOUS | Status: AC
  Administered 2024-07-01: 1000 mL via INTRAVENOUS

## 2024-07-01 MED ORDER — PIPERACILLIN-TAZOBACTAM 3.375 G IVPB 30 MIN: 3.3750 g | Freq: Three times a day (TID) | INTRAVENOUS | Status: DC

## 2024-07-01 MED ORDER — CLOZAPINE 100 MG PO TABS
200.0000 mg | ORAL_TABLET | Freq: Two times a day (BID) | ORAL | Status: DC
  Administered 2024-07-01 – 2024-07-11 (×20): 200 mg via ORAL
  Filled 2024-07-01 (×21): qty 2

## 2024-07-01 MED ORDER — POLYETHYLENE GLYCOL 3350 17 G PO PACK
17.0000 g | PACK | Freq: Two times a day (BID) | ORAL | Status: DC
  Administered 2024-07-02 – 2024-07-11 (×18): 17 g via ORAL
  Filled 2024-07-01 (×20): qty 1

## 2024-07-01 MED ORDER — NOREPINEPHRINE 4 MG/250ML-% IV SOLN
0.0000 ug/min | INTRAVENOUS | Status: DC
  Administered 2024-07-01: 2 ug/min via INTRAVENOUS
  Filled 2024-07-01: qty 250

## 2024-07-01 MED ORDER — ALBUMIN HUMAN 25 % IV SOLN
50.0000 g | INTRAVENOUS | Status: AC
  Administered 2024-07-01: 12.5 g via INTRAVENOUS
  Filled 2024-07-01: qty 200

## 2024-07-01 MED ORDER — SODIUM CHLORIDE 0.9 % IV SOLN
2.0000 g | Freq: Three times a day (TID) | INTRAVENOUS | Status: DC
  Administered 2024-07-01 – 2024-07-03 (×5): 2 g via INTRAVENOUS
  Filled 2024-07-01 (×5): qty 12.5

## 2024-07-01 MED ORDER — MIDODRINE HCL 5 MG PO TABS
10.0000 mg | ORAL_TABLET | Freq: Three times a day (TID) | ORAL | Status: DC
  Administered 2024-07-01: 10 mg via ORAL
  Filled 2024-07-01: qty 2

## 2024-07-01 MED ORDER — LACTATED RINGERS IV BOLUS (SEPSIS): 1000.0000 mL | Freq: Once | INTRAVENOUS | Status: DC

## 2024-07-01 MED ORDER — VANCOMYCIN HCL 1500 MG/300ML IV SOLN
1500.0000 mg | Freq: Once | INTRAVENOUS | Status: AC
  Administered 2024-07-01: 1500 mg via INTRAVENOUS
  Filled 2024-07-01: qty 300

## 2024-07-01 MED ORDER — MELATONIN 5 MG PO TABS
10.0000 mg | ORAL_TABLET | Freq: Every day | ORAL | Status: DC
  Administered 2024-07-01 – 2024-07-10 (×10): 10 mg via ORAL
  Filled 2024-07-01 (×10): qty 2

## 2024-07-01 MED ORDER — IOHEXOL 350 MG/ML SOLN
75.0000 mL | Freq: Once | INTRAVENOUS | Status: AC | PRN
  Administered 2024-07-01: 150 mL via INTRAVENOUS

## 2024-07-01 MED ORDER — ENOXAPARIN SODIUM 40 MG/0.4ML IJ SOSY
40.0000 mg | PREFILLED_SYRINGE | INTRAMUSCULAR | Status: DC
  Administered 2024-07-01 – 2024-07-08 (×8): 40 mg via SUBCUTANEOUS
  Filled 2024-07-01 (×8): qty 0.4

## 2024-07-01 MED ORDER — LACTATED RINGERS IV BOLUS: 500.0000 mL | Freq: Once | INTRAVENOUS | Status: DC

## 2024-07-01 MED ORDER — SODIUM CHLORIDE 0.9 % IV SOLN: INTRAVENOUS | Status: DC

## 2024-07-01 MED ORDER — PAROXETINE HCL 20 MG PO TABS
40.0000 mg | ORAL_TABLET | Freq: Every evening | ORAL | Status: DC
  Administered 2024-07-01 – 2024-07-10 (×10): 40 mg via ORAL
  Filled 2024-07-01 (×11): qty 2

## 2024-07-01 MED ORDER — LACTATED RINGERS IV BOLUS
500.0000 mL | INTRAVENOUS | Status: AC
Start: 2024-07-01 — End: 2024-07-01
  Administered 2024-07-01: 500 mL via INTRAVENOUS

## 2024-07-01 NOTE — ED Notes (Signed)
 Unsuccessful IV attempt x2.

## 2024-07-01 NOTE — Progress Notes (Signed)
 ED Pharmacy Antibiotic Sign Off An antibiotic consult was received from an ED provider for vancmycin per pharmacy dosing for bacteremia. A chart review was completed to assess appropriateness.   The following one time order(s) were placed:  Vancomycin  1500mg  IV x1  Further antibiotic and/or antibiotic pharmacy consults should be ordered by the admitting provider if indicated.   Thank you for allowing pharmacy to be a part of this patient's care.   Koren LITTIE Or, [facility]  Clinical Pharmacist 07/01/24 10:37 AM

## 2024-07-01 NOTE — ED Notes (Signed)
 Spoke with hospitalist regarding pt blood pressure not improving after more fluid and albumin  laying in trendelenburg.  She said she consulted ICU and the ICU doc would see the patient when she could. Not to give any further fluids as pt has EF below 20%.

## 2024-07-01 NOTE — ED Notes (Signed)
 Patient transported to CT

## 2024-07-01 NOTE — Patient Instructions (Addendum)
 PLEASE GO TO THE EMERGENCY ROOM   Medication Instructions:  STOP ENTRESTO  *If you need a refill on your cardiac medications before your next appointment, please call your pharmacy*  Lab Work: NONE ORDERED If you have labs (blood work) drawn today and your tests are completely normal, you will receive your results only by: MyChart Message (if you have MyChart) OR A paper copy in the mail If you have any lab test that is abnormal or we need to change your treatment, we will call you to review the results.  Testing/Procedures: NONE ORDERED  Follow-Up: At [facility] you and your health needs are our priority.  As part of our continuing mission to provide you with exceptional heart care, our providers are all part of one team.  This team includes your primary Cardiologist (physician) and Advanced Practice Providers or APPs (Physician Assistants and Nurse Practitioners) who all work together to provide you with the care you need, when you need it.  Your next appointment:   2 week(s)  Provider:   Lum Louis, NP    We recommend signing up for the patient portal called MyChart.  Sign up information is provided on this After Visit Summary.  MyChart is used to connect with patients for Virtual Visits (Telemedicine).  Patients are able to view lab/test results, encounter notes, upcoming appointments, etc.  Non-urgent messages can be sent to your provider as well.   To learn more about what you can do with MyChart, go to [email or web address].   Other Instructions DRINK 64 OUNCES OF WATER A DAY  Check your blood pressure TWICE A DAY for 2 weeks, then contact the office with your readings.  Contact the office either by phone or MyChart with your readings.  Make sure to check your blood pressure 2 hours after taking your medications.   AVOID these things for 30 minutes before checking your blood pressure: No Drinking caffeine. No Drinking alcohol. No Eating. No  Smoking. No Exercising.  Five minutes before checking your blood pressure: Pee. Sit in a dining chair. Avoid sitting in a soft couch or armchair. Be quiet. Do not talk.

## 2024-07-01 NOTE — Telephone Encounter (Signed)
Left VM to rtn call. Dm/cma       

## 2024-07-01 NOTE — Telephone Encounter (Signed)
 Copied from CRM [id number]. Topic: Clinical - Medical Advice >> Jun 29, 2024 11:05 AM Turkey A wrote: Reason for CRM: Aldona the Occupational Therapist would like to know information regarding patient's admission to the hospital. She said to call her ASAP at   [phone]

## 2024-07-01 NOTE — Progress Notes (Signed)
 Cross covering ICU physician  Called as pt's BP remains marginal.  He has received IVF with baseline LVEF 20%   Imaging c/w enteritis, pyelo and ? Pneumonia vs edema  Started on empiric abx vanc and zosyn  Midodrine  ongoing Received albumin  and ivf  Will start low dose norepi at this time.  Would benefit from HF consult as well in am with repeat echo as well.  Transfer to ICU and follow.  If needs cvc would place in internal jugular vs subclavian to obtain coox

## 2024-07-01 NOTE — Progress Notes (Addendum)
 Cardiology Office Note:    Date:  07/01/2024  ID:  Jordan Kennedy, DOB 18-Jan-1956, MRN [id number] PCP: [name] HERO, MD  [facility] Providers Cardiologist:  Dub Huntsman, DO       Patient Profile:       Chief Complaint: 1 month follow-up History of Present Illness:  Jordan Kennedy is a 68 y.o. male with visit-pertinent history of chronic systolic heart failure  Chronic systolic heart failure. Echo 05/02/2023: EF <20%.  Global hypokinesis.  Septal-lateral dyssynchrony consistent with LBBB.  Grade I DD.  Moderately reduced RV function.  Mild LAE.  Mild MR. LBBB. Frequent PVCs. Schizophrenia. Tardive dyskinesia. Intellectual disability. Anemia. Tobacco abuse. Quit March 2024.  Ward of the state. [facility] [employer] Social Services legal guardian. Resides in a group home  He was first evaluated by Dr. Huntsman on 01/03/2023 for LV dysfunction during hospital admission.  He was admitted on 3/12 for a 1.5-week history of nonproductive cough, nasal congestion, and weakness.  He tested negative for COVID prior to ED arrival.  He was febrile at 101.4 with tachycardia, tachypnea, and hypoxia requiring nonrebreather.  He was admitted for management of community-acquired pneumonia as well as suspected congestive heart failure with BNP of 95.  Echocardiogram showed LVEF 25 to 30%, WMA, mild asymmetric LVH of the inferior lateral segment, mildly reduced RV SF, moderate LAE, moderate to severe MR with splay artifact suggesting this may be underestimated.  No long QT interval on tachycardic.  Troponin was elevated which was likely demand.  He was diuresed IV Lasix .  EKG showed sinus tachycardia, NS IVCD with LBBB new from 2019.  Decisions were made not to proceed with cardiac catheterization as the patient had poor intellectual abilities and does not follow directions and there was concern for this procedure that he was not going to be able to lay flat.  Echo July 2024 showed EF <20%,  global hypokinesis, septal/lateral dyssynchrony consistent with LBBB, Grade I DD, moderately reduced RV function, mild LAE, mild MR   He was seen in clinic on 05/09/2023.  He was clinically stable and euvolemic.  He is referred to EP and advanced heart failure clinic.  Lopressor  was changed to Toprol .  He was evaluated by EP on 05/27/2023.  It was decided he would not benefit from invasive procedures.  Patient stated he does not want pacemaker or defibrillator implantation.  Patient seen in clinic on 07/05/2023 by Barnie, NP.  His blood pressure was low at 90/56 and his Toprol  was decreased to 12.5 mg daily.  He followed up on 09/11/2023 his blood pressure remain low.  He was started on midodrine  twice daily.  He was last seen in clinic on 05/27/2024.  Blood pressure had improved on midodrine  still on the lower side.  He was continued on Entresto , Farxiga , and Toprol -XL.  He was admitted on 06/09/2024 through 06/17/2024 for septic shock secondary to UTI.  He was started on broad-spectrum antibiotics.  He was discharged in stable condition.  He was admitted once more on 06/26/2024 through 06/28/2024 with sepsis secondary to presumed UTI.  He was appropriately treated and discharged home in stable condition.  Discussed the use of AI scribe software for clinical note transcription with the patient, who gave verbal consent to proceed.  History of Present Illness Jordan Kennedy is a 68 year old male with a history of HFrEF who presents with weakness and low blood pressure.  Today he presents with his caregiver.  His caregiver gives him his  medicines as prescribed.  He experiences persistent weakness and low blood pressure, with systolic readings typically in the nineties and occasionally dropping to the sixties. He takes midodrine  3 times daily to raise blood pressure.  His caregiver monitors his blood pressure and ensures he drinks at least three cups of water daily.   Patient has history of intellectual  disabilities however denies lightheadedness, dizziness, chest pain, or shortness of breath. No fevers or recent syncope.  He has a urinary catheter in place and is currently taking antibiotics for UTI.  Denies any abdominal pain or lower extremity pain  Review of systems:  Please see the history of present illness. All other systems are reviewed and otherwise negative.      Studies Reviewed:        Echocardiogram 05/02/2023  1. Left ventricular ejection fraction, by estimation, is <20%. The left  ventricle has severely decreased function. The left ventricle demonstrates  global hypokinesis. Septal-lateral dyssynchrony consistent wtih LBBB. The  left ventricular internal cavity   size was moderately dilated. Left ventricular diastolic parameters are  consistent with Grade I diastolic dysfunction (impaired relaxation).   2. Right ventricular systolic function is moderately reduced. The right  ventricular size is normal. Tricuspid regurgitation signal is inadequate  for assessing PA pressure.   3. Left atrial size was mildly dilated.   4. The mitral valve is normal in structure. Mild mitral valve  regurgitation. No evidence of mitral stenosis.   5. The aortic valve is tricuspid. Aortic valve regurgitation is not  visualized. No aortic stenosis is present.   6. The inferior vena cava is normal in size with greater than 50%  respiratory variability, suggesting right atrial pressure of 3 mmHg.   7. Biventricular failure with dyssynchrony. Consider CRT, consider  advanced HF.   Echocardiogram 01/02/2023  1. Left ventricular ejection fraction, by estimation, is 25 to 30%. The  left ventricle has severely decreased function. The left ventricle  demonstrates regional wall motion abnormalities (see scoring  diagram/findings for description). There is mild  asymmetric left ventricular hypertrophy of the infero-lateral segment.  Left ventricular diastolic parameters are indeterminate.   2.  Right ventricular systolic function is mildly reduced. The right  ventricular size is normal. There is normal pulmonary artery systolic  pressure. The estimated right ventricular systolic pressure is 21.0 mmHg.   3. Left atrial size was moderately dilated.   4. The mitral valve is grossly normal. Moderate to severe mitral valve  regurgitation with splay artifact, suggesting this may be underestimated.  No evidence of mitral stenosis.   5. The aortic valve is tricuspid. Aortic valve regurgitation is not  visualized. No aortic stenosis is present.   6. The inferior vena cava is normal in size with greater than 50%  respiratory variability, suggesting right atrial pressure of 3 mmHg.   Risk Assessment/Calculations:              Physical Exam:   VS:  BP (!) 61/41   Pulse (!) 115   Ht 6' 5 (1.956 m)   Wt 173 lb 3.2 oz (78.6 kg)   SpO2 99%   BMI 20.54 kg/m    Wt Readings from Last 3 Encounters:  07/01/24 173 lb 3.2 oz (78.6 kg)  06/26/24 170 lb 13.7 oz (77.5 kg)  06/25/24 168 lb 3.2 oz (76.3 kg)    GEN: Well nourished, well developed in no acute distress NECK: No JVD; No carotid bruits CARDIAC: RRR, no murmurs, rubs, gallops RESPIRATORY:  Clear  to auscultation without rales, wheezing or rhonchi  ABDOMEN: Soft, non-tender, non-distended EXTREMITIES:  No edema; No acute deformity      Assessment and Plan:  Chronic systolic heart failure Echocardiogram 04/2023 showed LVEF <20%, global hypokinesis, septal/lateral dyssynchrony consistent with LBBB, grade 1 DD, moderate reduced RV function, mild LAE, mild MR Patient evaluated by Dr. Inocencio on 05/27/2023 with decision to defer invasive procedures due to intellectual disability and patient verbalizing that he did not want pacemaker or defibrillator implantation He did not have cardiac catheterization at the time of his decreased LVEF due to poor intellectual abilities.  He also has left bundle branch block - Today he appears euvolemic and  well compensated.  Does appear ill on exam.  Fluid volume is stable.  Not requiring loop diuretic therapy - GDMT titration limited due to hypotension.  Caregiver does give medications daily - Plan hold Entresto  given hypotension today - Continue metoprolol  XL 12.5 mg daily, Farxiga  10 mg daily and midodrine  10 mg 3 times daily - Repeat echocardiogram scheduled for 9/12 to reevaluate LV function - ED evaluation today due to hypotension as noted below  Hypotension Admitted 05/2024 with septic shock secondary to UTI Admitted 06/2024 due to sepsis secondary to presumed UTI Hypotension has been managed on midodrine  complicated by HFrEF GDMT Blood pressure today 61/41 with heart rate 115.  And repeat 66/49 heart rate 125.  Temperature 98.8 Caregiver did give patient morning medications prior to visit today - Patient does have intellectual disabilities and denies any lightheadedness, dizziness, syncope.  States he feels bad.  Stood patient up in office and he became diaphoretic and experienced near syncope - Plan for further ED evaluation given hypotension, tachycardia, and sepsis secondary to presumed UTI - Hold Entresto  - Continue midodrine  10 mg 3 times daily      Dispo:  Return in about 2 weeks (around 07/15/2024).  Signed, Lum LITTIE Louis, NP

## 2024-07-01 NOTE — Plan of Care (Addendum)
 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  Per chart review patient has low EF below 20% and home patient is on midodrine  10 mg 3 times daily.  Patient being admitted for sepsis in the setting of UTI and history of chronic hypotension.  Initial lactic acid was 2.1 has been normalized to 1.4. - Received 1 L of LR fluid so far. - In the setting of low blood pressure giving 500 mL of LR bolus and 50 g of albumin . -Continue midodrine  10 mg 3 times daily.  If after receiving the IV fluid and albumin  bolus if MAP remains below 65 need to reach out to ICU for further recommendation. -Holding Toprol -XL and Entresto  in the setting of sepsis.  Lauree Yurick, MD [facility] 07/01/2024, 8:22 PM     At 10:27 PM, after resuscitating with albumin  50 g and 500 mL of LR bolus blood pressure soft MAP is 56.  Reaching out to PCCM again for the management of persistent hypotension in the setting of sepsis.    Spoke with Dr. Layman regarding persistent hypotension will evaluate patient soon.  Update at 11:30 PM.  Dr. Layman evaluated and transferring patient to ICU as initiated low-dose norepinephrine .  Appreciate ICU input regarding the patient care.   Amoura Ransier, MD [facility] 07/01/2024, 11:39 PM

## 2024-07-01 NOTE — ED Provider Notes (Signed)
 [facility] AT [facility] Provider Note   CSN: [id number] Arrival date & time: 07/01/24  1000     Patient presents with: Hypotension   [facility] is a 68 y.o. male.   HPI    The patient: Has no complaints at this time.  For caretaker: Patient is been acting normal.  Woke up this morning took all of his medications as prescribed.  Has not been have any nausea vomiting diarrhea that he is aware of.  No fevers that he is aware of.  No sick contacts he is aware of.  Per Bonney: Somebody else took him to the cardiology clinic today. He did receive all of his meds this morning. Patient BP was low at facility as well. No vomiting. No diarrhea.   Previous medical history reviewed : Patient was seen in the ED yesterday.  Patient was seen here because of clogged Foley.  Concern for clogged Foley.  Bladder distention.  Bladder scan showed 1000 cc retained urine.  Foley exchanged and flowing well.  Patient followed up with cardiology today.  Was found to be hypotensive.  Sent to ED for further evaluation.  Patient was last discharged inpatient on June 28, 2024.  Was unable constipation at that point time.  Imaging confirmed large stool volume in the colon with questionable colitis.  Sepsis due to undetermined organism.  Presumed to be UTI.  Keflex  outpatient.   Prior to Admission medications   Medication Sig Start Date End Date Taking? Authorizing Provider  amantadine  (SYMMETREL ) 100 MG capsule Take 100 mg by mouth at bedtime.     [provider]  aspirin  EC (ASPIRIN  EC ADULT LOW DOSE) 81 MG tablet TAKE 1 TABLET BY MOUTH EVERY DAY 02/07/24   [name] HERO, MD  cephALEXin  (KEFLEX ) 500 MG capsule Take 1 capsule (500 mg total) by mouth 4 (four) times daily for 3 days. 06/28/24 07/01/24  Lue Elsie BROCKS, MD  cloZAPine  (CLOZARIL ) 100 MG tablet Take 2 tablets (200 mg total) by mouth 2 (two) times daily. 03/12/17   Sebastian Toribio GAILS, MD  cyanocobalamin   1000 MCG tablet Take 1 tablet (1,000 mcg total) by mouth daily. 06/18/24   Jillian Buttery, MD  dapagliflozin  propanediol (FARXIGA ) 10 MG TABS tablet TAKE 1 TABLET BY MOUTH EVERY DAY 02/07/24   [name] HERO, MD  finasteride  (PROSCAR ) 5 MG tablet Take 1 tablet (5 mg total) by mouth daily. 06/02/24 05/28/25  [name] HERO, MD  LINZESS  290 MCG CAPS capsule TAKE 1 CAPSULE BY MOUTH EVERY MORNING BEFORE BREAKFAST 05/21/24   [name] HERO, MD  Melatonin 10 MG SUBL Place 10 mg under the tongue at bedtime.    [provider]  metoCLOPramide  (REGLAN ) 5 MG tablet Take 5 mg by mouth 2 (two) times daily. 06/23/24   [provider]  metoprolol  succinate (TOPROL -XL) 25 MG 24 hr tablet Take 12.5 mg by mouth daily. 06/23/24   [provider]  midodrine  (PROAMATINE ) 10 MG tablet Take 1 tablet (10 mg total) by mouth 3 (three) times daily with meals. 06/17/24 08/16/24  Jillian Buttery, MD  MYRBETRIQ  50 MG TB24 tablet Take 50 mg by mouth at bedtime. 06/23/24   [provider]  omeprazole  (PRILOSEC) 40 MG capsule TAKE 1 CAPSULE BY MOUTH EVERY EVENING 02/07/24   [name] HERO, MD  PARoxetine  (PAXIL ) 40 MG tablet Take 40 mg by mouth every evening.     [provider]  polyethylene glycol powder (GLYCOLAX /MIRALAX ) 17 GM/SCOOP powder  Take 17 g by mouth 2 (two) times daily. Dissolve 1 capful (17g) in 4-8 ounces of liquid and take by mouth daily. 06/28/24 09/26/24  Lue Elsie BROCKS, MD  STIMULANT LAXATIVE 8.6-50 MG tablet Take 1 tablet by mouth 2 (two) times daily. 06/23/24   [provider]  tamsulosin  (FLOMAX ) 0.4 MG CAPS capsule Take 0.4 mg by mouth daily after supper. 01/10/21   [provider]    Allergies: Patient has no known allergies.    Review of Systems  Constitutional:  Negative for chills and fever.  HENT:  Negative for ear pain and sore throat.   Eyes:  Negative for pain and visual disturbance.  Respiratory:  Negative for cough and shortness of breath.    Cardiovascular:  Negative for chest pain and palpitations.  Gastrointestinal:  Negative for abdominal pain and vomiting.  Genitourinary:  Negative for dysuria and hematuria.  Musculoskeletal:  Negative for arthralgias and back pain.  Skin:  Negative for color change and rash.  Neurological:  Negative for seizures and syncope.  All other systems reviewed and are negative.   Updated Vital Signs BP (!) 88/68   Pulse 99   Temp 98.1 F (36.7 C) (Oral)   Resp (!) 28   Ht 6' 5 (1.956 m)   Wt 78.5 kg   SpO2 100%   BMI 20.51 kg/m   Physical Exam Vitals and nursing note reviewed.  Constitutional:      General: He is not in acute distress.    Appearance: He is well-developed.  HENT:     Head: Normocephalic and atraumatic.  Eyes:     Conjunctiva/sclera: Conjunctivae normal.  Cardiovascular:     Rate and Rhythm: Normal rate and regular rhythm.     Heart sounds: No murmur heard. Pulmonary:     Effort: Pulmonary effort is normal. No respiratory distress.     Breath sounds: Normal breath sounds.  Abdominal:     Palpations: Abdomen is soft.     Tenderness: There is no abdominal tenderness.  Musculoskeletal:        General: No swelling.     Cervical back: Neck supple.  Skin:    General: Skin is warm and dry.     Capillary Refill: Capillary refill takes less than 2 seconds.  Neurological:     Mental Status: He is alert.  Psychiatric:        Mood and Affect: Mood normal.     (all labs ordered are listed, but only abnormal results are displayed) Labs Reviewed  COMPREHENSIVE METABOLIC PANEL WITH GFR - Abnormal; Notable for the following components:      Result Value   Glucose, Bld 125 (*)    Creatinine, Ser 1.27 (*)    Calcium  8.8 (*)    Total Protein 6.2 (*)    Albumin  2.6 (*)    All other components within normal limits  CBC WITH DIFFERENTIAL/PLATELET - Abnormal; Notable for the following components:   WBC 15.9 (*)    RBC 3.85 (*)    Hemoglobin 11.5 (*)    HCT 35.8  (*)    RDW 17.8 (*)    Platelets 408 (*)    Neutro Abs 12.1 (*)    Monocytes Absolute 1.4 (*)    All other components within normal limits  PROTIME-INR - Abnormal; Notable for the following components:   Prothrombin Time 15.4 (*)    All other components within normal limits  URINALYSIS, W/ REFLEX TO CULTURE (INFECTION SUSPECTED) - Abnormal; Notable for  the following components:   APPearance HAZY (*)    Glucose, UA >=500 (*)    Hgb urine dipstick LARGE (*)    Protein, ur 30 (*)    Leukocytes,Ua MODERATE (*)    All other components within normal limits  I-STAT CHEM 8, ED - Abnormal; Notable for the following components:   Creatinine, Ser 1.30 (*)    Glucose, Bld 126 (*)    Hemoglobin 12.6 (*)    HCT 37.0 (*)    All other components within normal limits  RESP PANEL BY RT-PCR (RSV, FLU A&B, COVID)  RVPGX2  CULTURE, BLOOD (ROUTINE X 2)  CULTURE, BLOOD (ROUTINE X 2)  URINE CULTURE  I-STAT CG4 LACTIC ACID, ED    EKG: EKG Interpretation Date/Time:  Wednesday July 01 2024 10:19:52 EDT Ventricular Rate:  108 PR Interval:  142 QRS Duration:  165 QT Interval:  405 QTC Calculation: 543 R Axis:   254  Text Interpretation: Sinus tachycardia Nonspecific IVCD with LAD No changes when compared with prior Confirmed by Simon Rea [phone]) on 07/01/2024 10:34:04 AM  Radiology: CT Angio Chest PE W and/or Wo Contrast Result Date: 07/01/2024 CLINICAL DATA:  Hypotension.  Pyelonephritis. EXAM: CT ANGIOGRAPHY CHEST CT ABDOMEN AND PELVIS WITH CONTRAST TECHNIQUE: Multidetector CT imaging of the chest was performed using the standard protocol during bolus administration of intravenous contrast. Multiplanar CT image reconstructions and MIPs were obtained to evaluate the vascular anatomy. Multidetector CT imaging of the abdomen and pelvis was performed using the standard protocol during bolus administration of intravenous contrast. RADIATION DOSE REDUCTION: This exam was performed according to the  departmental dose-optimization program which includes automated exposure control, adjustment of the mA and/or kV according to patient size and/or use of iterative reconstruction technique. CONTRAST:  150mL OMNIPAQUE  IOHEXOL  350 MG/ML SOLN COMPARISON:  Multiple exams, including 06/26/2023 and 06/26/2023 FINDINGS: Despite efforts by the technologist and patient, motion artifact is present on today's exam and could not be eliminated. This reduces exam sensitivity and specificity. CTA CHEST FINDINGS Cardiovascular: No filling defect is identified in the pulmonary arterial tree to suggest pulmonary embolus. Moderate cardiomegaly with left ventricular prominence. Small loculations of subclavian venous gas, most likely iatrogenic from IV line, and considered clinically inconsequential in the absence of a right to left cardiac shunt. Mediastinum/Nodes: 1 mildly dilated proximal half of the thoracic esophagus with an air-fluid level. Dysmotility not excluded. Right hilar node 0.9 cm in short axis on image 63 series 7. No overtly pathologic thoracic adenopathy observed. Lungs/Pleura: Patchy ground-glass opacities with some secondary pulmonary lobular interstitial accentuation at the lung apices, suspicious for mild pulmonary edema. Airway thickening is present, suggesting bronchitis or reactive airways disease. Musculoskeletal: Degenerative sternoclavicular arthropathy bilaterally. Thoracic spondylosis. Review of the MIP images confirms the above findings. CT ABDOMEN and PELVIS FINDINGS Hepatobiliary: Unremarkable Pancreas: Stable borderline prominence of dorsal pancreatic duct without other significant findings in the pancreas. Spleen: Unremarkable Adrenals/Urinary Tract: The kidneys are blurred by motion artifact. Dense contrast medium in the collecting systems, ureters, and urinary bladder, implying a delay between the CT angiogram of the chest and the CT abdomen. Reduced sensitivity for nonobstructive renal calculi. Foley  catheter in the urinary bladder. Right bladder cellules along the right UVJ. Mild renal cortical hypoenhancement in the left upper kidney laterally as on image 29 series 2, similar to the 06/26/2024 exam and potentially reflecting pyelonephritis. Adrenal glands unremarkable. Stomach/Bowel: Prominent stool throughout the colon favors constipation. Nonspecific mildly dilated loops of upper abdominal small bowel without obvious  obstruction, mild enteritis or small bowel ileus not excluded. Vascular/Lymphatic: Atherosclerosis is present, including aortoiliac atherosclerotic disease. Small retroperitoneal lymph nodes are not pathologically enlarged but may be reactive. Reproductive: Considerable prostatomegaly. Prominent median lobe indents the bladder base. Other: Trace ascites along the left paracolic gutter. Musculoskeletal: Lumbar spondylosis. Review of the MIP images confirms the above findings. IMPRESSION: 1. No filling defect is identified in the pulmonary arterial tree to suggest pulmonary embolus. 2. Moderate cardiomegaly with left ventricular prominence. 3. Patchy ground-glass opacities with some secondary pulmonary lobular interstitial accentuation at the lung apices, suspicious for mild pulmonary edema. 4. Airway thickening is present, suggesting bronchitis or reactive airways disease. 5. Mildly dilated proximal half of the thoracic esophagus with an air-fluid level. Dysmotility not excluded. 6. Prominent stool throughout the colon favors constipation. 7. Nonspecific mildly dilated loops of upper abdominal small bowel without obvious obstruction, mild enteritis or small bowel ileus not excluded. 8. Mild renal cortical hypoenhancement in the left upper kidney laterally, similar to the 06/26/2024 exam and potentially reflecting pyelonephritis. 9. Considerable prostatomegaly. 10. Trace ascites along the left paracolic gutter. 11.  Aortic Atherosclerosis (ICD10-I70.0). Electronically Signed   By: Ryan Salvage  M.D.   On: 07/01/2024 12:50   CT ABDOMEN PELVIS W CONTRAST Result Date: 07/01/2024 CLINICAL DATA:  Hypotension.  Pyelonephritis. EXAM: CT ANGIOGRAPHY CHEST CT ABDOMEN AND PELVIS WITH CONTRAST TECHNIQUE: Multidetector CT imaging of the chest was performed using the standard protocol during bolus administration of intravenous contrast. Multiplanar CT image reconstructions and MIPs were obtained to evaluate the vascular anatomy. Multidetector CT imaging of the abdomen and pelvis was performed using the standard protocol during bolus administration of intravenous contrast. RADIATION DOSE REDUCTION: This exam was performed according to the departmental dose-optimization program which includes automated exposure control, adjustment of the mA and/or kV according to patient size and/or use of iterative reconstruction technique. CONTRAST:  OMNIPAQUE  IOHEXOL  350 MG/ML SOLN COMPARISON:  Multiple exams, including 06/26/2023 and 06/26/2023 FINDINGS: Despite efforts by the technologist and patient, motion artifact is present on today's exam and could not be eliminated. This reduces exam sensitivity and specificity. CTA CHEST FINDINGS Cardiovascular: No filling defect is identified in the pulmonary arterial tree to suggest pulmonary embolus. Moderate cardiomegaly with left ventricular prominence. Small loculations of subclavian venous gas, most likely iatrogenic from IV line, and considered clinically inconsequential in the absence of a right to left cardiac shunt. Mediastinum/Nodes: 1 mildly dilated proximal half of the thoracic esophagus with an air-fluid level. Dysmotility not excluded. Right hilar node 0.9 cm in short axis on image 63 series 7. No overtly pathologic thoracic adenopathy observed. Lungs/Pleura: Patchy ground-glass opacities with some secondary pulmonary lobular interstitial accentuation at the lung apices, suspicious for mild pulmonary edema. Airway thickening is present, suggesting bronchitis or reactive  airways disease. Musculoskeletal: Degenerative sternoclavicular arthropathy bilaterally. Thoracic spondylosis. Review of the MIP images confirms the above findings. CT ABDOMEN and PELVIS FINDINGS Hepatobiliary: Unremarkable Pancreas: Stable borderline prominence of dorsal pancreatic duct without other significant findings in the pancreas. Spleen: Unremarkable Adrenals/Urinary Tract: The kidneys are blurred by motion artifact. Dense contrast medium in the collecting systems, ureters, and urinary bladder, implying a delay between the CT angiogram of the chest and the CT abdomen. Reduced sensitivity for nonobstructive renal calculi. Foley catheter in the urinary bladder. Right bladder cellules along the right UVJ. Mild renal cortical hypoenhancement in the left upper kidney laterally as on image 29 series 2, similar to the 06/26/2024 exam and potentially reflecting pyelonephritis. Adrenal glands  unremarkable. Stomach/Bowel: Prominent stool throughout the colon favors constipation. Nonspecific mildly dilated loops of upper abdominal small bowel without obvious obstruction, mild enteritis or small bowel ileus not excluded. Vascular/Lymphatic: Atherosclerosis is present, including aortoiliac atherosclerotic disease. Small retroperitoneal lymph nodes are not pathologically enlarged but may be reactive. Reproductive: Considerable prostatomegaly. Prominent median lobe indents the bladder base. Other: Trace ascites along the left paracolic gutter. Musculoskeletal: Lumbar spondylosis. Review of the MIP images confirms the above findings. IMPRESSION: 1. No filling defect is identified in the pulmonary arterial tree to suggest pulmonary embolus. 2. Moderate cardiomegaly with left ventricular prominence. 3. Patchy ground-glass opacities with some secondary pulmonary lobular interstitial accentuation at the lung apices, suspicious for mild pulmonary edema. 4. Airway thickening is present, suggesting bronchitis or reactive airways  disease. 5. Mildly dilated proximal half of the thoracic esophagus with an air-fluid level. Dysmotility not excluded. 6. Prominent stool throughout the colon favors constipation. 7. Nonspecific mildly dilated loops of upper abdominal small bowel without obvious obstruction, mild enteritis or small bowel ileus not excluded. 8. Mild renal cortical hypoenhancement in the left upper kidney laterally, similar to the 06/26/2024 exam and potentially reflecting pyelonephritis. 9. Considerable prostatomegaly. 10. Trace ascites along the left paracolic gutter. 11.  Aortic Atherosclerosis (ICD10-I70.0). Electronically Signed   By: Ryan Salvage M.D.   On: 07/01/2024 12:50   DG Chest Port 1 View Result Date: 07/01/2024 CLINICAL DATA:  Sepsis, hypotension and dizziness. EXAM: PORTABLE CHEST 1 VIEW COMPARISON:  06/12/2024 FINDINGS: Mild enlargement of the cardiopericardial silhouette, without edema. No blunting of the costophrenic angles. The lungs appear clear. Mild lower thoracic spondylosis. IMPRESSION: 1. Mild enlargement of the cardiopericardial silhouette, without edema. 2. Mild lower thoracic spondylosis. Electronically Signed   By: Ryan Salvage M.D.   On: 07/01/2024 11:28     Procedures   Medications Ordered in the ED  midodrine  (PROAMATINE ) tablet 10 mg (10 mg Oral Given 07/01/24 1146)  0.9 %  sodium chloride  infusion ( Intravenous New Bag/Given 07/01/24 1337)  lactated ringers  bolus 1,000 mL (0 mLs Intravenous Stopped 07/01/24 1139)  piperacillin -tazobactam (ZOSYN ) IVPB 3.375 g (0 g Intravenous Stopped 07/01/24 1139)  vancomycin  (VANCOREADY) IVPB 1500 mg/300 mL (0 mg Intravenous Stopped 07/01/24 1408)  sodium chloride  0.9 % bolus 1,000 mL (0 mLs Intravenous Stopped 07/01/24 1315)  iohexol  (OMNIPAQUE ) 350 MG/ML injection 75 mL (150 mLs Intravenous Contrast Given 07/01/24 1227)                                    Medical Decision Making Amount and/or Complexity of Data Reviewed Labs:  ordered. Radiology: ordered.  Risk Prescription drug management.   The patient: Has no complaints at this time.  For caretaker: Patient is been acting normal.  Woke up this morning took all of his medications as prescribed.  Has not been have any nausea vomiting diarrhea that he is aware of.  No fevers that he is aware of.  No sick contacts he is aware of.  Per Bonney: Somebody else took him to the cardiology clinic today. He did receive all of his meds this morning. Patient BP was low at facility as well. No vomiting. No diarrhea.   Previous medical history reviewed : Patient was seen in the ED yesterday.  Patient was seen here because of clogged Foley.  Concern for clogged Foley.  Bladder distention.  Bladder scan showed 1000 cc retained urine.  Foley exchanged and flowing  well.  Patient followed up with cardiology today.  Was found to be hypotensive.  Sent to ED for further evaluation.  Patient was last discharged inpatient on June 28, 2024.  Was unable constipation at that point time.  Imaging confirmed large stool volume in the colon with questionable colitis.  Sepsis due to undetermined organism.  Presumed to be UTI.  Keflex  outpatient.   Previous medical history reviewed : Patient was seen in the ED yesterday.  Patient was seen here because of clogged Foley.  Concern for clogged Foley.  Bladder distention.  Bladder scan showed 1000 cc retained urine.  Foley exchanged and flowing well.  Patient followed up with cardiology today.  Was found to be hypotensive.  Sent to ED for further evaluation.  Patient was last discharged inpatient on June 28, 2024.  Was unable constipation at that point time.  Imaging confirmed large stool volume in the colon with questionable colitis.  Sepsis due to undetermined organism.  Presumed to be UTI.  Keflex  outpatient.   Upon exam, patient hypotensive.  Afebrile.  Tachycardic into the low 100s.  72/46 with some tachypnea as well.  Lung sounds clear.  No  significant rebound or tenderness of the abdomen.   Given hypotension, did obtain septic workup.  Lactic acid negative.  Does have leukocytosis.  Given EF is only 20%, was not super aggressive about fluid resuscitation but did give him a total of 2 L over 2 hours.  Maps improved to 60s and 70s after fluid resuscitation.  No indication to start peripheral pressors at this point time.  Patient was dosed with 10 mg of midodrine  as well.  Takes 10 mg 3 times daily at home.   In terms of source of possible infection, unclear at this point time.  Question whether or not this could be ongoing pyelonephritis based off CT imaging but urine was largely unremarkable.  I had already started patient on broad-spectrum antibiotics including vancomycin  as well as Zosyn  given patient undifferentiated shock.  CT scan of the chest did not show any kind of PE.  Obtain this because of tachycardia as well as hypotension.  No obvious infiltrate that could be concerning for pneumonia.  Questionable findings of bronchitis on the CT scan.  Negative COVID RSV and flu.   CT scan did show mildly dilated loops of upper abdominal small bowel without evidence of high-grade obstruction or transition point.   Consulted critical care who feels that patient is stable for the inpatient service.   Patient will be admitted to medicine.   CRITICAL CARE Performed by: Lavonia LOISE Pat   Total critical care time: 45 minutes  Critical care time was exclusive of separately billable procedures and treating other patients.  Critical care was necessary to treat or prevent imminent or life-threatening deterioration.  Critical care was time spent personally by me on the following activities: development of treatment plan with patient and/or surrogate as well as nursing, discussions with consultants, evaluation of patient's response to treatment, examination of patient, obtaining history from patient or surrogate, ordering and performing  treatments and interventions, ordering and review of laboratory studies, ordering and review of radiographic studies, pulse oximetry and re-evaluation of patient's condition.      Final diagnoses:  Sepsis, due to unspecified organism, unspecified whether acute organ dysfunction present (HCC)  Hypotension, unspecified hypotension type  Leukocytosis, unspecified type    ED Discharge Orders     None          Pat Lavonia  N, MD 07/01/24 1446

## 2024-07-01 NOTE — H&P (Signed)
 History and Physical    Patient: Jordan Kennedy FMW:[id number] DOB: Nov 30, 1955 DOA: 07/01/2024 DOS: the patient was seen and examined on 07/01/2024 PCP: [name] HERO, MD  Patient coming from: Home  Chief Complaint:  Chief Complaint  Patient presents with   Hypotension   HPI: Jordan Kennedy is a 68 y.o. male with medical history significant of schizophrenia and intellectual disability, HFrEF (EF <20% on last echo in 04/2023), hx of BPH and bladder outlet obstruction w/ admission in 05/2024 for septic shock secondary to E. Coli and Enterobacter UTI and discharged 8/27 with an indwelling catheter, as well as recent admission this past week on 9/5 for presumed colitis and discharged on midodrine  p/w hypotension 2/2 sepsis of presumed urinary origin.   Pt is a poor historian from what I can gather, pt was evaluated at OP cards office and noted to have hypotension (60/40s) for which EMS was activated and pt BIBA to ED for further evaluation.  In the ED, pt hypotensive 70/50s (MAP 60s) but now 90/70s (MAP 70s). Labs notable for  WBC 15.9 and Cr 1.3. UA pos for LE, but neg for nitrite. EDP consutled CCM who agreed with medicine admission to PCU.    Review of Systems: As mentioned in the history of present illness. All other systems reviewed and are negative. Past Medical History:  Diagnosis Date   AKI (acute kidney injury) (HCC) 06/29/2018   Anemia    Anemia, iron deficiency 09/18/2015   Bowel obstruction (HCC)    BPH (benign prostatic hyperplasia)    Chronic constipation    Chronic systolic heart failure (HCC)    History of ileus    Ileus (HCC) 03/07/2017   Mental retardation    Partial bowel obstruction (HCC)    Schizophrenia (HCC)    Monarche every three months;    SIRS (systemic inflammatory response syndrome) (HCC) 06/29/2018   Small bowel obstruction (HCC) 06/29/2018   History reviewed. No pertinent surgical history. Social History:  reports that he has quit smoking. His smoking  use included cigarettes. He has a 7.5 pack-year smoking history. He has never used smokeless tobacco. He reports that he does not drink alcohol and does not use drugs.  No Known Allergies  Family History  Problem Relation Age of Onset   Cancer Mother        unknown type    Prior to Admission medications   Medication Sig Start Date End Date Taking? Authorizing Provider  amantadine  (SYMMETREL ) 100 MG capsule Take 100 mg by mouth at bedtime.     [provider]  aspirin  EC (ASPIRIN  EC ADULT LOW DOSE) 81 MG tablet TAKE 1 TABLET BY MOUTH EVERY DAY 02/07/24   [name] HERO, MD  cephALEXin  (KEFLEX ) 500 MG capsule Take 1 capsule (500 mg total) by mouth 4 (four) times daily for 3 days. 06/28/24 07/01/24  Lue Elsie BROCKS, MD  cloZAPine  (CLOZARIL ) 100 MG tablet Take 2 tablets (200 mg total) by mouth 2 (two) times daily. 03/12/17   Sebastian Toribio GAILS, MD  cyanocobalamin  1000 MCG tablet Take 1 tablet (1,000 mcg total) by mouth daily. 06/18/24   Jillian Buttery, MD  dapagliflozin  propanediol (FARXIGA ) 10 MG TABS tablet TAKE 1 TABLET BY MOUTH EVERY DAY 02/07/24   [name] HERO, MD  finasteride  (PROSCAR ) 5 MG tablet Take 1 tablet (5 mg total) by mouth daily. 06/02/24 05/28/25  [name] HERO, MD  LINZESS  290 MCG CAPS capsule TAKE 1 CAPSULE BY MOUTH EVERY MORNING BEFORE BREAKFAST 05/21/24  [name] HERO, MD  Melatonin 10 MG SUBL Place 10 mg under the tongue at bedtime.    [provider]  metoCLOPramide  (REGLAN ) 5 MG tablet Take 5 mg by mouth 2 (two) times daily. 06/23/24   [provider]  metoprolol  succinate (TOPROL -XL) 25 MG 24 hr tablet Take 12.5 mg by mouth daily. 06/23/24   [provider]  midodrine  (PROAMATINE ) 10 MG tablet Take 1 tablet (10 mg total) by mouth 3 (three) times daily with meals. 06/17/24 08/16/24  Jillian Buttery, MD  MYRBETRIQ  50 MG TB24 tablet Take 50 mg by mouth at bedtime. 06/23/24   [provider]  omeprazole  (PRILOSEC) 40 MG capsule TAKE  1 CAPSULE BY MOUTH EVERY EVENING 02/07/24   [name] HERO, MD  PARoxetine  (PAXIL ) 40 MG tablet Take 40 mg by mouth every evening.     [provider]  polyethylene glycol powder (GLYCOLAX /MIRALAX ) 17 GM/SCOOP powder Take 17 g by mouth 2 (two) times daily. Dissolve 1 capful (17g) in 4-8 ounces of liquid and take by mouth daily. 06/28/24 09/26/24  Lue Elsie BROCKS, MD  STIMULANT LAXATIVE 8.6-50 MG tablet Take 1 tablet by mouth 2 (two) times daily. 06/23/24   [provider]  tamsulosin  (FLOMAX ) 0.4 MG CAPS capsule Take 0.4 mg by mouth daily after supper. 01/10/21   [provider]    Physical Exam: Vitals:   07/01/24 1345 07/01/24 1400 07/01/24 1404 07/01/24 1415  BP: (!) 87/76 93/72  (!) 88/68  Pulse: 94 (!) 101  99  Resp: (!) 21 (!) 23  (!) 28  Temp:   98.1 F (36.7 C)   TempSrc:   Oral   SpO2: 100% 100%  100%  Weight:      Height:       General: Alert, oriented x3, resting comfortably in no acute distress Respiratory: Lungs clear to auscultation bilaterally with normal respiratory effort; no w/r/r Cardiovascular: Regular rate and rhythm w/o m/r/g   Data Reviewed:  Lab Results  Component Value Date   WBC 15.9 (H) 07/01/2024   HGB 12.6 (L) 07/01/2024   HCT 37.0 (L) 07/01/2024   MCV 93.0 07/01/2024   PLT 408 (H) 07/01/2024   Lab Results  Component Value Date   GLUCOSE 126 (H) 07/01/2024   CALCIUM  8.8 (L) 07/01/2024   NA 143 07/01/2024   K 4.6 07/01/2024   CO2 23 07/01/2024   CL 110 07/01/2024   BUN 17 07/01/2024   CREATININE 1.30 (H) 07/01/2024   Lab Results  Component Value Date   ALT 21 07/01/2024   AST 24 07/01/2024   ALKPHOS 86 07/01/2024   BILITOT 0.6 07/01/2024   Lab Results  Component Value Date   INR 1.2 07/01/2024   INR 1.3 (H) 05/15/2024   Radiology: CT Angio Chest PE W and/or Wo Contrast Result Date: 07/01/2024 CLINICAL DATA:  Hypotension.  Pyelonephritis. EXAM: CT ANGIOGRAPHY CHEST CT ABDOMEN AND PELVIS WITH CONTRAST  TECHNIQUE: Multidetector CT imaging of the chest was performed using the standard protocol during bolus administration of intravenous contrast. Multiplanar CT image reconstructions and MIPs were obtained to evaluate the vascular anatomy. Multidetector CT imaging of the abdomen and pelvis was performed using the standard protocol during bolus administration of intravenous contrast. RADIATION DOSE REDUCTION: This exam was performed according to the departmental dose-optimization program which includes automated exposure control, adjustment of the mA and/or kV according to patient size and/or use of iterative reconstruction technique. CONTRAST:  OMNIPAQUE  IOHEXOL  350 MG/ML SOLN COMPARISON:  Multiple exams, including 06/26/2023 and 06/26/2023 FINDINGS: Despite efforts by the technologist and patient, motion artifact is present on today's exam and could not be eliminated. This reduces exam sensitivity and specificity. CTA CHEST FINDINGS Cardiovascular: No filling defect is identified in the pulmonary arterial tree to suggest pulmonary embolus. Moderate cardiomegaly with left ventricular prominence. Small loculations of subclavian venous gas, most likely iatrogenic from IV line, and considered clinically inconsequential in the absence of a right to left cardiac shunt. Mediastinum/Nodes: 1 mildly dilated proximal half of the thoracic esophagus with an air-fluid level. Dysmotility not excluded. Right hilar node 0.9 cm in short axis on image 63 series 7. No overtly pathologic thoracic adenopathy observed. Lungs/Pleura: Patchy ground-glass opacities with some secondary pulmonary lobular interstitial accentuation at the lung apices, suspicious for mild pulmonary edema. Airway thickening is present, suggesting bronchitis or reactive airways disease. Musculoskeletal: Degenerative sternoclavicular arthropathy bilaterally. Thoracic spondylosis. Review of the MIP images confirms the above findings. CT ABDOMEN and PELVIS FINDINGS  Hepatobiliary: Unremarkable Pancreas: Stable borderline prominence of dorsal pancreatic duct without other significant findings in the pancreas. Spleen: Unremarkable Adrenals/Urinary Tract: The kidneys are blurred by motion artifact. Dense contrast medium in the collecting systems, ureters, and urinary bladder, implying a delay between the CT angiogram of the chest and the CT abdomen. Reduced sensitivity for nonobstructive renal calculi. Foley catheter in the urinary bladder. Right bladder cellules along the right UVJ. Mild renal cortical hypoenhancement in the left upper kidney laterally as on image 29 series 2, similar to the 06/26/2024 exam and potentially reflecting pyelonephritis. Adrenal glands unremarkable. Stomach/Bowel: Prominent stool throughout the colon favors constipation. Nonspecific mildly dilated loops of upper abdominal small bowel without obvious obstruction, mild enteritis or small bowel ileus not excluded. Vascular/Lymphatic: Atherosclerosis is present, including aortoiliac atherosclerotic disease. Small retroperitoneal lymph nodes are not pathologically enlarged but may be reactive. Reproductive: Considerable prostatomegaly. Prominent median lobe indents the bladder base. Other: Trace ascites along the left paracolic gutter. Musculoskeletal: Lumbar spondylosis. Review of the MIP images confirms the above findings. IMPRESSION: 1. No filling defect is identified in the pulmonary arterial tree to suggest pulmonary embolus. 2. Moderate cardiomegaly with left ventricular prominence. 3. Patchy ground-glass opacities with some secondary pulmonary lobular interstitial accentuation at the lung apices, suspicious for mild pulmonary edema. 4. Airway thickening is present, suggesting bronchitis or reactive airways disease. 5. Mildly dilated proximal half of the thoracic esophagus with an air-fluid level. Dysmotility not excluded. 6. Prominent stool throughout the colon favors constipation. 7. Nonspecific  mildly dilated loops of upper abdominal small bowel without obvious obstruction, mild enteritis or small bowel ileus not excluded. 8. Mild renal cortical hypoenhancement in the left upper kidney laterally, similar to the 06/26/2024 exam and potentially reflecting pyelonephritis. 9. Considerable prostatomegaly. 10. Trace ascites along the left paracolic gutter. 11.  Aortic Atherosclerosis (ICD10-I70.0). Electronically Signed   By: Ryan Salvage M.D.   On: 07/01/2024 12:50   CT ABDOMEN PELVIS W CONTRAST Result Date: 07/01/2024 CLINICAL DATA:  Hypotension.  Pyelonephritis. EXAM: CT ANGIOGRAPHY CHEST CT ABDOMEN AND PELVIS WITH CONTRAST TECHNIQUE: Multidetector CT imaging of the chest was performed using the standard protocol during bolus administration of intravenous contrast. Multiplanar CT image reconstructions and MIPs were obtained to evaluate the vascular anatomy. Multidetector CT imaging of the abdomen and pelvis was performed using the standard protocol during bolus administration of intravenous contrast. RADIATION DOSE REDUCTION: This exam was performed according to the departmental dose-optimization program which includes automated exposure control, adjustment of the mA and/or kV  according to patient size and/or use of iterative reconstruction technique. CONTRAST:  OMNIPAQUE  IOHEXOL  350 MG/ML SOLN COMPARISON:  Multiple exams, including 06/26/2023 and 06/26/2023 FINDINGS: Despite efforts by the technologist and patient, motion artifact is present on today's exam and could not be eliminated. This reduces exam sensitivity and specificity. CTA CHEST FINDINGS Cardiovascular: No filling defect is identified in the pulmonary arterial tree to suggest pulmonary embolus. Moderate cardiomegaly with left ventricular prominence. Small loculations of subclavian venous gas, most likely iatrogenic from IV line, and considered clinically inconsequential in the absence of a right to left cardiac shunt. Mediastinum/Nodes:  1 mildly dilated proximal half of the thoracic esophagus with an air-fluid level. Dysmotility not excluded. Right hilar node 0.9 cm in short axis on image 63 series 7. No overtly pathologic thoracic adenopathy observed. Lungs/Pleura: Patchy ground-glass opacities with some secondary pulmonary lobular interstitial accentuation at the lung apices, suspicious for mild pulmonary edema. Airway thickening is present, suggesting bronchitis or reactive airways disease. Musculoskeletal: Degenerative sternoclavicular arthropathy bilaterally. Thoracic spondylosis. Review of the MIP images confirms the above findings. CT ABDOMEN and PELVIS FINDINGS Hepatobiliary: Unremarkable Pancreas: Stable borderline prominence of dorsal pancreatic duct without other significant findings in the pancreas. Spleen: Unremarkable Adrenals/Urinary Tract: The kidneys are blurred by motion artifact. Dense contrast medium in the collecting systems, ureters, and urinary bladder, implying a delay between the CT angiogram of the chest and the CT abdomen. Reduced sensitivity for nonobstructive renal calculi. Foley catheter in the urinary bladder. Right bladder cellules along the right UVJ. Mild renal cortical hypoenhancement in the left upper kidney laterally as on image 29 series 2, similar to the 06/26/2024 exam and potentially reflecting pyelonephritis. Adrenal glands unremarkable. Stomach/Bowel: Prominent stool throughout the colon favors constipation. Nonspecific mildly dilated loops of upper abdominal small bowel without obvious obstruction, mild enteritis or small bowel ileus not excluded. Vascular/Lymphatic: Atherosclerosis is present, including aortoiliac atherosclerotic disease. Small retroperitoneal lymph nodes are not pathologically enlarged but may be reactive. Reproductive: Considerable prostatomegaly. Prominent median lobe indents the bladder base. Other: Trace ascites along the left paracolic gutter. Musculoskeletal: Lumbar spondylosis.  Review of the MIP images confirms the above findings. IMPRESSION: 1. No filling defect is identified in the pulmonary arterial tree to suggest pulmonary embolus. 2. Moderate cardiomegaly with left ventricular prominence. 3. Patchy ground-glass opacities with some secondary pulmonary lobular interstitial accentuation at the lung apices, suspicious for mild pulmonary edema. 4. Airway thickening is present, suggesting bronchitis or reactive airways disease. 5. Mildly dilated proximal half of the thoracic esophagus with an air-fluid level. Dysmotility not excluded. 6. Prominent stool throughout the colon favors constipation. 7. Nonspecific mildly dilated loops of upper abdominal small bowel without obvious obstruction, mild enteritis or small bowel ileus not excluded. 8. Mild renal cortical hypoenhancement in the left upper kidney laterally, similar to the 06/26/2024 exam and potentially reflecting pyelonephritis. 9. Considerable prostatomegaly. 10. Trace ascites along the left paracolic gutter. 11.  Aortic Atherosclerosis (ICD10-I70.0). Electronically Signed   By: Ryan Salvage M.D.   On: 07/01/2024 12:50   DG Chest Port 1 View Result Date: 07/01/2024 CLINICAL DATA:  Sepsis, hypotension and dizziness. EXAM: PORTABLE CHEST 1 VIEW COMPARISON:  06/12/2024 FINDINGS: Mild enlargement of the cardiopericardial silhouette, without edema. No blunting of the costophrenic angles. The lungs appear clear. Mild lower thoracic spondylosis. IMPRESSION: 1. Mild enlargement of the cardiopericardial silhouette, without edema. 2. Mild lower thoracic spondylosis. Electronically Signed   By: Ryan Salvage M.D.   On: 07/01/2024 11:28    Assessment and  Plan: 34M h/o schizophrenia and intellectual disability, HFrEF (EF <20% on last echo in 04/2023), hx of BPH and bladder outlet obstruction w/ admission in 05/2024 for septic shock secondary to E. Coli and Enterobacter UTI and discharged 8/27 with an indwelling catheter, as well as  recent admission this past week on 9/5 for presumed colitis and discharged on midodrine  p/w hypotension 2/2 sepsis of presumed urinary origin.   Sepsis of presumed urinary origin CAUTI Hypotension -Appreciate CCM following -IV Cefepime  per pharmacy protocol -HOLD pta GDMT (Entresto  per report) for now -PTA midodrine  10mg  TID -Defer MIVF for now given reduced EF  HFrEF -HOLD pta Entresto  -PTA midodrine  per above -Defer MIVF per above  BPH -PTA finasteride  and tamsulosin   Schizophrenia -PTA Clozaril  and Paxil    Advance Care Planning:   Code Status: Prior   Consults: N/A  Family Communication: [name] Monroe (legal guardian)  Severity of Illness: The appropriate patient status for this patient is INPATIENT. Inpatient status is judged to be reasonable and necessary in order to provide the required intensity of service to ensure the patient's safety. The patient's presenting symptoms, physical exam findings, and initial radiographic and laboratory data in the context of their chronic comorbidities is felt to place them at high risk for further clinical deterioration. Furthermore, it is not anticipated that the patient will be medically stable for discharge from the hospital within 2 midnights of admission.   * I certify that at the point of admission it is my clinical judgment that the patient will require inpatient hospital care spanning beyond 2 midnights from the point of admission due to high intensity of service, high risk for further deterioration and high frequency of surveillance required.*   ------- I spent 56 minutes reviewing previous notes, at the bedside counseling/discussing the treatment plan, and performing clinical documentation.  Author: Marsha Ada, MD 07/01/2024 3:17 PM  For on call review [email or web address].

## 2024-07-01 NOTE — ED Notes (Signed)
Pt given graham crackers and Sprite  

## 2024-07-01 NOTE — Consult Note (Signed)
 NAME:  Jordan Kennedy, MRN:  [id number], DOB:  05-11-1956, LOS: 0 ADMISSION DATE:  07/01/2024, CONSULTATION DATE:  07/01/24 REFERRING MD:  EDP, CHIEF COMPLAINT:  constipation   History of Present Illness:  Jordan Kennedy is a 68 y.o. M with PMH schizophrenia and intellectual disability, HFrEF with EF <20% on last echo, hx of BPH and bladder outlet obstruction with admission in August  for septic shock secondary to E. Coli and Enterobacter UTI and discharged 8/27 with an indwelling catheter. Also admitted  9/5 for constipation and possible colitis and discharged  home on 9/7 on midodrine .   Today he presents with hypotension, generalized abdominal pain and vaguely  not feeling well, he was seen in the ED 9/9 for obstructed foley and then sent home, on the day of admission was being seen at a cardiology follow up appt and was sent to the ED for hypotension. BP 61/41 in the ED initially, improved with IVF and work-up concerning for possible UTI.  Lactic acid <2, MAP's in 70's.  PCCM consulted for hypotension in the setting of low EF   Pertinent  Medical History   has a past medical history of AKI (acute kidney injury) (HCC) (06/29/2018), Anemia, Anemia, iron deficiency (09/18/2015), Bowel obstruction (HCC), BPH (benign prostatic hyperplasia), Chronic constipation, Chronic systolic heart failure (HCC), History of ileus, Ileus (HCC) (03/07/2017), Mental retardation, Partial bowel obstruction (HCC), Schizophrenia (HCC), SIRS (systemic inflammatory response syndrome) (HCC) (06/29/2018), and Small bowel obstruction (HCC) (06/29/2018).   Significant Hospital Events: Including procedures, antibiotic start and stop dates in addition to other pertinent events   9/10 admit with UTI  Interim History / Subjective:  Pt awake and alert and asking for sprite and cookies   Objective    Blood pressure 93/72, pulse (!) 101, temperature 98.1 F (36.7 C), temperature source Oral, resp. rate (!) 23, height 6' 5  (1.956 m), weight 78.5 kg, SpO2 100%.        Intake/Output Summary (Last 24 hours) at 07/01/2024 1409 Last data filed at 07/01/2024 1315 Gross per 24 hour  Intake 1100.71 ml  Output --  Net 1100.71 ml   Filed Weights   07/01/24 1010  Weight: 78.5 kg    General:  well nourished M, resting in bed in NAD HEENT: MM pink/moist Neuro: awake  CV: s1s2 rrr, no m/r/g PULM:  clear bilaterally on RA  GI: soft, mildly distended, active BS, non-tender to palpation Extremities: warm/dry, no edema     Resolved problem list   Assessment and Plan   UTI, hypotension improved with volume  BPH with recent bladder outlet obstruction and indwelling foley History of HFrEF Constipation BP improved after 1 L LR and lactic acid WNL -continue midodrine  -blood and urine cultures -hold Entresto  and BB in the setting of hypotension -bowel regimen -monitor volume status   Thank you for this consult, PCCM will be available for any further critical care needs   Labs   CBC: Recent Labs  Lab 06/25/24 0901 06/26/24 0907 06/27/24 0303 07/01/24 1031 07/01/24 1055  WBC 12.3* 14.8* 12.2* 15.9*  --   NEUTROABS 9.0* 11.0* 6.9 12.1*  --   HGB 13.0 13.4 11.7* 11.5* 12.6*  HCT 40.4 43.4 37.6* 35.8* 37.0*  MCV 91.5 94.6 95.7 93.0  --   PLT 525.0* 501* 441* 408*  --     Basic Metabolic Panel: Recent Labs  Lab 06/25/24 0901 06/26/24 0907 06/27/24 0303 07/01/24 1031 07/01/24 1055  NA 139 138 137 139 143  K 4.5  4.7 4.2 4.5 4.6  CL 103 103 103 111 110  CO2 25 20* 23 23  --   GLUCOSE 139* 174* 99 125* 126*  BUN 18 16 17 15 17   CREATININE 1.30 1.29* 1.07 1.27* 1.30*  CALCIUM  9.8 9.6 9.0 8.8*  --    GFR: Estimated Creatinine Clearance: 62.9 mL/min (A) (by C-G formula based on SCr of 1.3 mg/dL (H)). Recent Labs  Lab 06/25/24 0901 06/26/24 0907 06/26/24 0945 06/26/24 1340 06/27/24 0303 07/01/24 1031 07/01/24 1055  WBC 12.3* 14.8*  --   --  12.2* 15.9*  --   LATICACIDVEN  --   --  2.1*  1.4  --   --  1.7    Liver Function Tests: Recent Labs  Lab 06/26/24 0907 06/27/24 0303 07/01/24 1031  AST 21 19 24   ALT 19 13 21   ALKPHOS 145* 131* 86  BILITOT 0.4 0.3 0.6  PROT 7.3 6.3* 6.2*  ALBUMIN  3.6 3.2* 2.6*   Recent Labs  Lab 06/26/24 0907  LIPASE 30   No results for input(s): AMMONIA in the last 168 hours.  ABG    Component Value Date/Time   PHART 7.414 01/01/2023 1716   PCO2ART 36.9 01/01/2023 1716   PO2ART 58 (L) 01/01/2023 1716   HCO3 23.7 01/01/2023 1716   TCO2 22 07/01/2024 1055   ACIDBASEDEF 1.0 01/01/2023 1716   O2SAT 90 01/01/2023 1716     Coagulation Profile: Recent Labs  Lab 07/01/24 1031  INR 1.2    Cardiac Enzymes: No results for input(s): CKTOTAL, CKMB, CKMBINDEX, TROPONINI in the last 168 hours.  HbA1C: Hgb A1c MFr Bld  Date/Time Value Ref Range Status  06/27/2024 03:03 AM 6.6 (H) 4.8 - 5.6 % Final    Comment:    (NOTE) Diagnosis of Diabetes The following HbA1c ranges recommended by the American Diabetes Association (ADA) may be used as an aid in the diagnosis of diabetes mellitus.  Hemoglobin             Suggested A1C NGSP%              Diagnosis  <5.7                   Non Diabetic  5.7-6.4                Pre-Diabetic  >6.4                   Diabetic  <7.0                   Glycemic control for                       adults with diabetes.    09/14/2015 04:37 PM 5.9 (H) <5.7 % Final    Comment:                                                                           According to the ADA Clinical Practice Recommendations for 2011, when HbA1c is used as a screening test:     >=6.5%   Diagnostic of Diabetes Mellitus            (if abnormal  result is confirmed)   5.7-6.4%   Increased risk of developing Diabetes Mellitus   References:Diagnosis and Classification of Diabetes Mellitus,Diabetes Care,2011,34(Suppl 1):S62-S69 and Standards of Medical Care in         Diabetes - 2011,Diabetes Care,2011,34 (Suppl  1):S11-S61.       CBG: Recent Labs  Lab 06/26/24 0933  GLUCAP 137*    Review of Systems:   Please see the history of present illness. All other systems reviewed and are negative    Past Medical History:  He,  has a past medical history of AKI (acute kidney injury) (HCC) (06/29/2018), Anemia, Anemia, iron deficiency (09/18/2015), Bowel obstruction (HCC), BPH (benign prostatic hyperplasia), Chronic constipation, Chronic systolic heart failure (HCC), History of ileus, Ileus (HCC) (03/07/2017), Mental retardation, Partial bowel obstruction (HCC), Schizophrenia (HCC), SIRS (systemic inflammatory response syndrome) (HCC) (06/29/2018), and Small bowel obstruction (HCC) (06/29/2018).   Surgical History:  History reviewed. No pertinent surgical history.   Social History:   reports that he has quit smoking. His smoking use included cigarettes. He has a 7.5 pack-year smoking history. He has never used smokeless tobacco. He reports that he does not drink alcohol and does not use drugs.   Family History:  His family history includes Cancer in his mother.   Allergies No Known Allergies   Home Medications  Prior to Admission medications   Medication Sig Start Date End Date Taking? Authorizing Provider  amantadine  (SYMMETREL ) 100 MG capsule Take 100 mg by mouth at bedtime.     [provider]  aspirin  EC (ASPIRIN  EC ADULT LOW DOSE) 81 MG tablet TAKE 1 TABLET BY MOUTH EVERY DAY 02/07/24   [name] HERO, MD  cloZAPine  (CLOZARIL ) 100 MG tablet Take 2 tablets (200 mg total) by mouth 2 (two) times daily. 03/12/17   Sebastian Toribio GAILS, MD  cyanocobalamin  1000 MCG tablet Take 1 tablet (1,000 mcg total) by mouth daily. 06/18/24   Jillian Buttery, MD  dapagliflozin  propanediol (FARXIGA ) 10 MG TABS tablet TAKE 1 TABLET BY MOUTH EVERY DAY 02/07/24   [name] HERO, MD  finasteride  (PROSCAR ) 5 MG tablet Take 1 tablet (5 mg total) by mouth daily. 06/02/24 05/28/25  [name] HERO, MD  LINZESS  290  MCG CAPS capsule TAKE 1 CAPSULE BY MOUTH EVERY MORNING BEFORE BREAKFAST 05/21/24   [name] HERO, MD  Melatonin 10 MG SUBL Place 10 mg under the tongue at bedtime.    [provider]  midodrine  (PROAMATINE ) 10 MG tablet Take 1 tablet (10 mg total) by mouth 3 (three) times daily with meals. 06/17/24 08/16/24  Jillian Buttery, MD  omeprazole  (PRILOSEC) 40 MG capsule TAKE 1 CAPSULE BY MOUTH EVERY EVENING 02/07/24   [name] HERO, MD  PARoxetine  (PAXIL ) 40 MG tablet Take 40 mg by mouth every evening.     [provider]  tamsulosin  (FLOMAX ) 0.4 MG CAPS capsule Take 0.4 mg by mouth daily after supper. 01/10/21   [provider]     Critical care time:    n/a      Leita SAUNDERS Merci Walthers, PA-C [facility] See Amion for pager If no response to pager , please call [phone] until 7pm After 7:00 pm call Elink  [phone]

## 2024-07-01 NOTE — ED Notes (Signed)
 ED Provider at bedside.

## 2024-07-01 NOTE — ED Triage Notes (Signed)
 Per PTAR, Pt coming from Cardiology office d/t hypotension (66/48) and dizziness.  PTAR reports Pt was at office for a routine follow-up.  Hx of CHF, MR, and schizophrenia.      Pt has a legal guardian.

## 2024-07-01 NOTE — ED Notes (Signed)
 Pt has no complaints.  Caregiver reports Pt didn't look good, when they were getting ready to leave the Cardiology office.  Pt denies dizziness.

## 2024-07-02 ENCOUNTER — Inpatient Hospital Stay (HOSPITAL_COMMUNITY)

## 2024-07-02 ENCOUNTER — Other Ambulatory Visit: Payer: Self-pay

## 2024-07-02 DIAGNOSIS — I5022 Chronic systolic (congestive) heart failure: Secondary | ICD-10-CM

## 2024-07-02 DIAGNOSIS — N390 Urinary tract infection, site not specified: Secondary | ICD-10-CM | POA: Diagnosis not present

## 2024-07-02 DIAGNOSIS — I420 Dilated cardiomyopathy: Secondary | ICD-10-CM

## 2024-07-02 DIAGNOSIS — A4150 Gram-negative sepsis, unspecified: Secondary | ICD-10-CM | POA: Diagnosis not present

## 2024-07-02 LAB — ECHOCARDIOGRAM COMPLETE
Area-P 1/2: 5.97 cm2
Calc EF: 18.1 %
Est EF: <20
Height: 77 in
S' Lateral: 6.1 cm
Single Plane A2C EF: 21.2 %
Single Plane A4C EF: 15.9 %
Weight: 2768 [oz_av]

## 2024-07-02 LAB — BASIC METABOLIC PANEL WITH GFR
Anion gap: 9 (ref 5–15)
BUN: 12 mg/dL (ref 8–23)
CO2: 23 mmol/L (ref 22–32)
Calcium: 8.5 mg/dL — ABNORMAL LOW (ref 8.9–10.3)
Chloride: 107 mmol/L (ref 98–111)
Creatinine, Ser: 1.17 mg/dL (ref 0.61–1.24)
GFR, Estimated: >60 mL/min (ref >60)
Glucose, Bld: 111 mg/dL — ABNORMAL HIGH (ref 70–99)
Potassium: 4.1 mmol/L (ref 3.5–5.1)
Sodium: 139 mmol/L (ref 135–145)

## 2024-07-02 LAB — LACTIC ACID, PLASMA
Lactic Acid, Venous: 0.9 mmol/L (ref 0.5–1.9)
Lactic Acid, Venous: 1.7 mmol/L (ref 0.5–1.9)

## 2024-07-02 LAB — GLUCOSE, CAPILLARY
Glucose-Capillary: 104 mg/dL — ABNORMAL HIGH (ref 70–99)
Glucose-Capillary: 104 mg/dL — ABNORMAL HIGH (ref 70–99)
Glucose-Capillary: 114 mg/dL — ABNORMAL HIGH (ref 70–99)
Glucose-Capillary: 119 mg/dL — ABNORMAL HIGH (ref 70–99)
Glucose-Capillary: 136 mg/dL — ABNORMAL HIGH (ref 70–99)
Glucose-Capillary: 138 mg/dL — ABNORMAL HIGH (ref 70–99)
Glucose-Capillary: 141 mg/dL — ABNORMAL HIGH (ref 70–99)
Glucose-Capillary: 163 mg/dL — ABNORMAL HIGH (ref 70–99)

## 2024-07-02 LAB — CBC
HCT: 32.3 % — ABNORMAL LOW (ref 39.0–52.0)
Hemoglobin: 10.3 g/dL — ABNORMAL LOW (ref 13.0–17.0)
MCH: 29.4 pg (ref 26.0–34.0)
MCHC: 31.9 g/dL (ref 30.0–36.0)
MCV: 92.3 fL (ref 80.0–100.0)
Platelets: 361 K/uL (ref 150–400)
RBC: 3.5 MIL/uL — ABNORMAL LOW (ref 4.22–5.81)
RDW: 17.8 % — ABNORMAL HIGH (ref 11.5–15.5)
WBC: 11.7 K/uL — ABNORMAL HIGH (ref 4.0–10.5)
nRBC: 0 % (ref 0.0–0.2)

## 2024-07-02 LAB — MRSA NEXT GEN BY PCR, NASAL: MRSA by PCR Next Gen: NOT DETECTED

## 2024-07-02 LAB — MAGNESIUM: Magnesium: 2.1 mg/dL (ref 1.7–2.4)

## 2024-07-02 MED ORDER — CHLORHEXIDINE GLUCONATE CLOTH 2 % EX PADS
6.0000 | MEDICATED_PAD | Freq: Every day | CUTANEOUS | Status: DC
  Administered 2024-07-02 – 2024-07-11 (×10): 6 via TOPICAL

## 2024-07-02 MED ORDER — PERFLUTREN LIPID MICROSPHERE
1.0000 mL | INTRAVENOUS | Status: AC | PRN
  Administered 2024-07-02: 3 mL via INTRAVENOUS

## 2024-07-02 NOTE — Plan of Care (Addendum)
 A+Ox4 albeit forgetful. Weaned off norepi, bp adequate. Echo completed. UOP via foley. Urine culture sent. Ate 100! Of meals. Problem: Education: Goal: Knowledge of General Education information will improve Description: Including pain rating scale, medication(s)/side effects and non-pharmacologic comfort measures Outcome: Progressing   Problem: Health Behavior/Discharge Planning: Goal: Ability to manage health-related needs will improve Outcome: Progressing   Problem: Clinical Measurements: Goal: Ability to maintain clinical measurements within normal limits will improve Outcome: Progressing Goal: Will remain free from infection Outcome: Progressing Goal: Diagnostic test results will improve Outcome: Progressing Goal: Respiratory complications will improve Outcome: Progressing Goal: Cardiovascular complication will be avoided Outcome: Progressing   Problem: Activity: Goal: Risk for activity intolerance will decrease Outcome: Progressing   Problem: Nutrition: Goal: Adequate nutrition will be maintained Outcome: Progressing   Problem: Coping: Goal: Level of anxiety will decrease Outcome: Progressing   Problem: Elimination: Goal: Will not experience complications related to bowel motility Outcome: Progressing Goal: Will not experience complications related to urinary retention Outcome: Progressing   Problem: Pain Managment: Goal: General experience of comfort will improve and/or be controlled Outcome: Progressing   Problem: Safety: Goal: Ability to remain free from injury will improve Outcome: Progressing   Problem: Skin Integrity: Goal: Risk for impaired skin integrity will decrease Outcome: Progressing

## 2024-07-02 NOTE — TOC Initial Note (Signed)
 Transition of Care [facility]) - Initial/Assessment Note    Patient Details  Name: Jordan Kennedy MRN: [id number] Date of Birth: Aug 18, 1956  [facility] CM/SW Contact:    Lauraine FORBES Saa, LCSWA Phone Number: 07/02/2024, 11:56 AM  Clinical Narrative:                  11:56 AM CSW introduced self and role to patient's guardian, [place]. [name] confirmed that she is patient's legal guardian/Guardian APS Child psychotherapist. [name] confirmed patient resides with [facility] in a Group Home through [facility], but expressed concerns of patient returning due to health needs. [name] stated that she has been in contact with [facility] SNF for LTC who requested and FL2. CSW requested medical team to place PT/OT orders for potential STR needs. CSW informed [name] that FL2 will be submitted upon PT/OT recommendations. [name] expressed understanding. [name] confirmed that patient does not have prior SNF history and does not use DME. [name] confirmed patient's insurance and PCP. Per chart review, patient has HH history with [facility] and DME (rolling walker) history with [facility]. Patient's preferred pharmacy is [facility].  Expected Discharge Plan: Skilled Nursing Facility Barriers to Discharge: Continued Medical Work up, English as a second language teacher   Patient Goals and CMS Choice            Expected Discharge Plan and Services In-house Referral: Clinical Social Work     Living arrangements for the past 2 months: Group Home ([facility])                                      Prior Living Arrangements/Services Living arrangements for the past 2 months: Group Home ([facility]) Lives with:: Other (Comment) ([facility]) Patient language and need for interpreter reviewed:: Yes        Need for Family Participation in Patient Care: Yes (Comment) Care giver support system in  place?: Yes (comment)   Criminal Activity/Legal Involvement Pertinent to Current Situation/Hospitalization: No - Comment as needed  Activities of Daily Living      Permission Sought/Granted Permission sought to share information with : Guardian Permission granted to share information with : No (Contact information on chart)  Share Information with NAME: [name] Monroe     Permission granted to share info w Relationship: Legal Guardian/Guardianship Social Worker  Permission granted to share info w Contact Information: [phone]  Emotional Assessment       Orientation: : Oriented to Self, Oriented to Place, Oriented to  Time, Oriented to Situation Alcohol / Substance Use: Not Applicable Psych Involvement: No (comment)  Admission diagnosis:  Sepsis (HCC) [A41.9] Hypotension, unspecified hypotension type [I95.9] Leukocytosis, unspecified type [D72.829] Sepsis due to gram-negative urinary tract infection (HCC) [A41.50, N39.0] Sepsis, due to unspecified organism, unspecified whether acute organ dysfunction present [facility]) [A41.9] Patient Active Problem List   Diagnosis Date Noted   Hypotension due to hypovolemia 07/01/2024   Sepsis due to gram-negative urinary tract infection (HCC) 07/01/2024   Sepsis (HCC) 07/01/2024   Sepsis due to undetermined organism (HCC) 06/26/2024   Elevated troponin 06/26/2024   Hyperglycemia 06/26/2024   Glucose intolerance (impaired glucose tolerance) 06/26/2024   UTI (urinary tract infection) 06/10/2024   Acute metabolic encephalopathy 06/10/2024   Hyponatremia 06/10/2024   Hydronephrosis due to obstruction of bladder 06/02/2024   Obstructive nephropathy due to benign prostatic hyperplasia 06/02/2024  Bladder outlet obstruction 05/15/2024   Enuresis 06/19/2023   Left bundle branch block 02/13/2023   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   Tinea pedis of both feet 01/11/2023   Chronic HFrEF (heart failure  with reduced ejection fraction) (HCC) 01/03/2023   Prolonged QT interval 01/01/2023   Transaminitis 01/01/2023   Thrombocytosis 01/01/2023   Tardive dyskinesia 11/20/2021   BPH with obstruction/lower urinary tract symptoms 05/02/2021   History of small bowel obstruction 01/31/2021   History of colon polyps 01/31/2021   Onychomycosis 01/31/2021   Gastroesophageal reflux disease 01/31/2021   AKI (acute kidney injury) (HCC) 06/29/2018   Tobacco use disorder, mild, in sustained remission 02/01/2015   Constipation due to slow transit 04/25/2012   Anxiety and depression 04/25/2012   Edentulous 04/25/2012   Schizophrenia (HCC)    Intellectual disability    PCP:  [name] HERO, MD Pharmacy:   [facility] - [place], [place] - [address] [place] [address] Phone: [phone] Fax: [phone]     Social Drivers of Health (SDOH) Social History: SDOH Screenings   Food Insecurity: Patient Unable To Answer (07/02/2024)  Housing: Patient Unable To Answer (07/02/2024)  Transportation Needs: Patient Unable To Answer (07/02/2024)  Utilities: Patient Unable To Answer (07/02/2024)  Alcohol Screen: Low Risk  (11/05/2023)  Depression (PHQ2-9): Low Risk  (11/05/2023)  Financial Resource Strain: Low Risk  (11/05/2023)  Physical Activity: Inactive (11/05/2023)  Social Connections: Patient Unable To Answer (07/02/2024)  Stress: No Stress Concern Present (11/05/2023)  Tobacco Use: Medium Risk (07/01/2024)  Health Literacy: Adequate Health Literacy (11/05/2023)   SDOH Interventions:     Readmission Risk Interventions    06/28/2024   11:41 AM 06/11/2024   10:27 AM  Readmission Risk Prevention Plan  Transportation Screening Complete Complete  PCP or Specialist Appt within 5-7 Days Complete   PCP or Specialist Appt within 3-5 Days  Complete  Home Care Screening Complete   Medication Review (RN CM) Complete   HRI or Home Care Consult  Complete  Social Work Consult for  Recovery Care Planning/Counseling  Complete  Palliative Care Screening  Not Applicable  Medication Review [employer])  Complete

## 2024-07-02 NOTE — Progress Notes (Signed)
 eLink Physician-Brief Progress Note Patient Name: Jordan Kennedy DOB: 14-Dec-1955 MRN: [id number]   Date of Service  07/02/2024  HPI/Events of Note  Patient with a history of schizophrenia and cardiomyopathy with EF of 20 % admitted with hypotension r/o sepsis with shock, work up in progress, he is on pressors.  eICU Interventions  New patient Evaluation.        Shyloh Krinke U Marcena Dias 07/02/2024, 1:15 AM

## 2024-07-02 NOTE — Progress Notes (Signed)
  Echocardiogram 2D Echocardiogram has been performed.  Jordan Kennedy 07/02/2024, 11:15 AM

## 2024-07-02 NOTE — Progress Notes (Signed)
 NAME:  Jordan Kennedy, MRN:  [id number], DOB:  1956/02/23, LOS: 1 ADMISSION DATE:  07/01/2024, CONSULTATION DATE:  07/02/24 REFERRING MD:  EDP, CHIEF COMPLAINT:  constipation   History of Present Illness:  Jordan Kennedy is a 68 y.o. M with PMH schizophrenia and intellectual disability, HFrEF with EF <20% on last echo, hx of BPH and bladder outlet obstruction with admission in August  for septic shock secondary to E. Coli and Enterobacter UTI and discharged 8/27 with an indwelling catheter. Also admitted  9/5 for constipation and possible colitis and discharged  home on 9/7 on midodrine .   9/10 he presents with hypotension, generalized abdominal pain and vaguely  not feeling well, he was seen in the ED 9/9 for obstructed foley and then sent home, on the day of admission was being seen at a cardiology follow up appt and was sent to the ED for hypotension. BP 61/41 in the ED initially, improved with IVF and work-up concerning for possible UTI.  Lactic acid <2, MAP's in 70's.  PCCM consulted for hypotension in the setting of low EF   Pertinent  Medical History   has a past medical history of AKI (acute kidney injury) (HCC) (06/29/2018), Anemia, Anemia, iron deficiency (09/18/2015), Bowel obstruction (HCC), BPH (benign prostatic hyperplasia), Chronic constipation, Chronic systolic heart failure (HCC), History of ileus, Ileus (HCC) (03/07/2017), Mental retardation, Partial bowel obstruction (HCC), Schizophrenia (HCC), SIRS (systemic inflammatory response syndrome) (HCC) (06/29/2018), and Small bowel obstruction (HCC) (06/29/2018).   Significant Hospital Events: Including procedures, antibiotic start and stop dates in addition to other pertinent events   9/10 admit with UTI  Interim History / Subjective:   Transferred to ICU, started on Levophed  low-dose 2 mics overnight Afebrile Excellent urine output 1.6 L Reports hearing voices Objective    Blood pressure 103/64, pulse (!) 104, temperature 98.2  F (36.8 C), temperature source Oral, resp. rate (!) 23, height 6' 5 (1.956 m), weight 78.5 kg, SpO2 100%.        Intake/Output Summary (Last 24 hours) at 07/02/2024 1053 Last data filed at 07/02/2024 1000 Gross per 24 hour  Intake 2021.86 ml  Output 2150 ml  Net -128.14 ml   Filed Weights   07/01/24 1010  Weight: 78.5 kg    General:  well nourished M, sitting up in bed, no distress HEENT: MM pink/moist Neuro: awake , alert, interactive, dyskinesia  CV: s1s2 rrr, no m/r/g PULM:  clear bilateral, no accessory muscle use GI: soft, nontender Extremities: warm/dry, no edema   Lactate normal Normal electrolytes, decreased leukocytosis, stable anemia  Resolved problem list   Assessment and Plan   Sepsis, UTI -transferred to ICU for hypotension requiring pressors BPH with recent bladder outlet obstruction and indwelling foley Normal lactate reassuring  -continue midodrine  10 3 times daily -blood cx and ensure urine culture sent -Continue cefepime    History of HFrEF , EF less than 20%  -hold Entresto  and BB in the setting of hypotension -bowel regimen   Schizophrenia -continue clozapine   Labs   CBC: Recent Labs  Lab 06/26/24 0907 06/27/24 0303 07/01/24 1031 07/01/24 1055 07/02/24 0057  WBC 14.8* 12.2* 15.9*  --  11.7*  NEUTROABS 11.0* 6.9 12.1*  --   --   HGB 13.4 11.7* 11.5* 12.6* 10.3*  HCT 43.4 37.6* 35.8* 37.0* 32.3*  MCV 94.6 95.7 93.0  --  92.3  PLT 501* 441* 408*  --  361    Basic Metabolic Panel: Recent Labs  Lab 06/26/24 0907 06/27/24 0303 07/01/24  1031 07/01/24 1055 07/02/24 0057  NA 138 137 139 143 139  K 4.7 4.2 4.5 4.6 4.1  CL 103 103 111 110 107  CO2 20* 23 23  --  23  GLUCOSE 174* 99 125* 126* 111*  BUN 16 17 15 17 12   CREATININE 1.29* 1.07 1.27* 1.30* 1.17  CALCIUM  9.6 9.0 8.8*  --  8.5*  MG  --   --   --   --  2.1   GFR: Estimated Creatinine Clearance: 69.9 mL/min (by C-G formula based on SCr of 1.17 mg/dL). Recent Labs   Lab 06/26/24 0907 06/26/24 0945 06/26/24 1340 06/27/24 0303 07/01/24 1031 07/01/24 1055 07/02/24 0057 07/02/24 0356  WBC 14.8*  --   --  12.2* 15.9*  --  11.7*  --   LATICACIDVEN  --    < > 1.4  --   --  1.7 0.9 1.7   < > = values in this interval not displayed.    Liver Function Tests: Recent Labs  Lab 06/26/24 0907 06/27/24 0303 07/01/24 1031  AST 21 19 24   ALT 19 13 21   ALKPHOS 145* 131* 86  BILITOT 0.4 0.3 0.6  PROT 7.3 6.3* 6.2*  ALBUMIN  3.6 3.2* 2.6*   Recent Labs  Lab 06/26/24 0907  LIPASE 30   No results for input(s): AMMONIA in the last 168 hours.  ABG    Component Value Date/Time   PHART 7.414 01/01/2023 1716   PCO2ART 36.9 01/01/2023 1716   PO2ART 58 (L) 01/01/2023 1716   HCO3 23.7 01/01/2023 1716   TCO2 22 07/01/2024 1055   ACIDBASEDEF 1.0 01/01/2023 1716   O2SAT 90 01/01/2023 1716     Coagulation Profile: Recent Labs  Lab 07/01/24 1031  INR 1.2    Cardiac Enzymes: No results for input(s): CKTOTAL, CKMB, CKMBINDEX, TROPONINI in the last 168 hours.  HbA1C: Hgb A1c MFr Bld  Date/Time Value Ref Range Status  06/27/2024 03:03 AM 6.6 (H) 4.8 - 5.6 % Final    Comment:    (NOTE) Diagnosis of Diabetes The following HbA1c ranges recommended by the American Diabetes Association (ADA) may be used as an aid in the diagnosis of diabetes mellitus.  Hemoglobin             Suggested A1C NGSP%              Diagnosis  <5.7                   Non Diabetic  5.7-6.4                Pre-Diabetic  >6.4                   Diabetic  <7.0                   Glycemic control for                       adults with diabetes.    09/14/2015 04:37 PM 5.9 (H) <5.7 % Final    Comment:  According to the ADA Clinical Practice Recommendations for 2011, when HbA1c is used as a screening test:     >=6.5%   Diagnostic of Diabetes Mellitus            (if abnormal result is  confirmed)   5.7-6.4%   Increased risk of developing Diabetes Mellitus   References:Diagnosis and Classification of Diabetes Mellitus,Diabetes Care,2011,34(Suppl 1):S62-S69 and Standards of Medical Care in         Diabetes - 2011,Diabetes Care,2011,34 (Suppl 1):S11-S61.       CBG: Recent Labs  Lab 06/26/24 0933 07/02/24 0022 07/02/24 0101 07/02/24 0354 07/02/24 0808  GLUCAP 137* 104* 104* 141* 136*    Harden Staff MD. FCCP. [facility] Pager : [phone]  If no response to pager , please call [phone] until 7 pm After 7:00 pm call Elink  [phone]   07/02/2024

## 2024-07-03 ENCOUNTER — Ambulatory Visit (HOSPITAL_COMMUNITY)

## 2024-07-03 DIAGNOSIS — N390 Urinary tract infection, site not specified: Secondary | ICD-10-CM | POA: Diagnosis not present

## 2024-07-03 DIAGNOSIS — A4150 Gram-negative sepsis, unspecified: Secondary | ICD-10-CM | POA: Diagnosis not present

## 2024-07-03 LAB — PHOSPHORUS: Phosphorus: 2.7 mg/dL (ref 2.5–4.6)

## 2024-07-03 LAB — URINE CULTURE
Culture: NO GROWTH
Culture: NO GROWTH
Special Requests: NORMAL

## 2024-07-03 LAB — CBC WITH DIFFERENTIAL/PLATELET
Abs Immature Granulocytes: 0.03 K/uL (ref 0.00–0.07)
Basophils Absolute: 0.1 K/uL (ref 0.0–0.1)
Basophils Relative: 1 %
Eosinophils Absolute: 0.5 K/uL (ref 0.0–0.5)
Eosinophils Relative: 4 %
HCT: 36.8 % — ABNORMAL LOW (ref 39.0–52.0)
Hemoglobin: 11.6 g/dL — ABNORMAL LOW (ref 13.0–17.0)
Immature Granulocytes: 0 %
Lymphocytes Relative: 22 %
Lymphs Abs: 2.8 K/uL (ref 0.7–4.0)
MCH: 29.9 pg (ref 26.0–34.0)
MCHC: 31.5 g/dL (ref 30.0–36.0)
MCV: 94.8 fL (ref 80.0–100.0)
Monocytes Absolute: 1.6 K/uL — ABNORMAL HIGH (ref 0.1–1.0)
Monocytes Relative: 13 %
Neutro Abs: 7.4 K/uL (ref 1.7–7.7)
Neutrophils Relative %: 60 %
Platelets: 404 K/uL — ABNORMAL HIGH (ref 150–400)
RBC: 3.88 MIL/uL — ABNORMAL LOW (ref 4.22–5.81)
RDW: 17.8 % — ABNORMAL HIGH (ref 11.5–15.5)
WBC: 12.4 K/uL — ABNORMAL HIGH (ref 4.0–10.5)
nRBC: 0 % (ref 0.0–0.2)

## 2024-07-03 LAB — GLUCOSE, CAPILLARY
Glucose-Capillary: 186 mg/dL — ABNORMAL HIGH (ref 70–99)
Glucose-Capillary: 106 mg/dL — ABNORMAL HIGH (ref 70–99)
Glucose-Capillary: 118 mg/dL — ABNORMAL HIGH (ref 70–99)
Glucose-Capillary: 113 mg/dL — ABNORMAL HIGH (ref 70–99)
Glucose-Capillary: 116 mg/dL — ABNORMAL HIGH (ref 70–99)

## 2024-07-03 LAB — BASIC METABOLIC PANEL WITH GFR
Anion gap: 9 (ref 5–15)
BUN: 9 mg/dL (ref 8–23)
CO2: 21 mmol/L — ABNORMAL LOW (ref 22–32)
Calcium: 9 mg/dL (ref 8.9–10.3)
Chloride: 111 mmol/L (ref 98–111)
Creatinine, Ser: 0.94 mg/dL (ref 0.61–1.24)
GFR, Estimated: >60 mL/min (ref >60)
Glucose, Bld: 103 mg/dL — ABNORMAL HIGH (ref 70–99)
Potassium: 4.5 mmol/L (ref 3.5–5.1)
Sodium: 141 mmol/L (ref 135–145)

## 2024-07-03 LAB — MAGNESIUM: Magnesium: 1.8 mg/dL (ref 1.7–2.4)

## 2024-07-03 MED ORDER — AMANTADINE HCL 100 MG PO CAPS
100.0000 mg | ORAL_CAPSULE | Freq: Every day | ORAL | Status: DC
  Administered 2024-07-03 – 2024-07-10 (×8): 100 mg via ORAL
  Filled 2024-07-03 (×9): qty 1

## 2024-07-03 MED ORDER — CIPROFLOXACIN HCL 500 MG PO TABS
500.0000 mg | ORAL_TABLET | Freq: Two times a day (BID) | ORAL | Status: AC
  Administered 2024-07-03 – 2024-07-05 (×6): 500 mg via ORAL
  Filled 2024-07-03 (×7): qty 1

## 2024-07-03 NOTE — Evaluation (Signed)
 Physical Therapy Evaluation Patient Details Name: Jordan Kennedy MRN: [id number] DOB: 13-Apr-1956 Today's Date: 07/03/2024  History of Present Illness  Pt is a 68 y.o. with admission in August for septic shock and UTI and discharged 8/27 with an indwelling catheter. Admitted  9/5 for constipation and possible colitis and discharged  home on 9/7.  Admitted 9/11 with hypotension, generalized weakness and abdominal pain.  Pt with sepsis and UTI. PMH: cognitive impairment, schizophrenia and intellectual disability , CHF, tardive dyskinesia, chronic constipation, anemia, hx of SBO, BPH.  Clinical Impression  Patient presents with decreased mobility due to generalized weakness, decreased balance, decreased safety awareness.  Previously at group home, states not using walker, though with multiple recent readmissions and unsure of help available at d/c feel pt may benefit from inpatient rehab (<3 hours.day) at d/c.  PT will continue to follow while in the acute setting as well.     Supine 106/73  Sitting 117/82       Standing 104/71  After ambulation @ unit 93/75       Pt. Denied dizziness  If plan is discharge home, recommend the following: A little help with walking and/or transfers;Help with stairs or ramp for entrance;A little help with bathing/dressing/bathroom;Assistance with cooking/housework   Can travel by private vehicle   Yes    Equipment Recommendations None recommended by PT  Recommendations for Other Services       Functional Status Assessment Patient has had a recent decline in their functional status and demonstrates the ability to make significant improvements in function in a reasonable and predictable amount of time.     Precautions / Restrictions Precautions Precautions: Fall Recall of Precautions/Restrictions: Impaired Precaution/Restrictions Comments: foley      Mobility  Bed Mobility Overal bed mobility: Needs Assistance Bed Mobility: Supine to Sit     Supine  to sit: Contact guard     General bed mobility comments: assist for safety and lines    Transfers Overall transfer level: Needs assistance Equipment used: Rolling walker (2 wheels) Transfers: Sit to/from Stand Sit to Stand: Contact guard assist           General transfer comment: assist for lines/balance    Ambulation/Gait Ambulation/Gait assistance: Min assist, +2 safety/equipment Gait Distance (Feet): 150 Feet Assistive device: Rolling walker (2 wheels) Gait Pattern/deviations: Step-through pattern, Decreased stride length, Knees buckling, Shuffle       General Gait Details: occasional R knee buckle and pt with short shuffling steps initiallly though improved with increased distance, A for walker management throughout; +2 for IV/telemetry management  Stairs            Wheelchair Mobility     Tilt Bed    Modified Rankin (Stroke Patients Only)       Balance Overall balance assessment: Needs assistance Sitting-balance support: Feet supported Sitting balance-Leahy Scale: Good     Standing balance support: During functional activity, No upper extremity supported, Bilateral upper extremity supported Standing balance-Leahy Scale: Poor Standing balance comment: support when washing hands at sink                             Pertinent Vitals/Pain Pain Assessment Pain Assessment: No/denies pain Faces Pain Scale: No hurt    Home Living Family/patient expects to be discharged to:: Group home Living Arrangements: Non-relatives/Friends Available Help at Discharge: Personal care attendant Type of Home: House             Additional  Comments: unsure of PLOF/ anticipate need for SNF    Prior Function Prior Level of Function : Needs assist;Patient poor historian/Family not available             Mobility Comments: states he does not use a RW at baseline ADLs Comments: suspect assist for majority of ADL/iADLs given baseline cognitive  impairments     Extremity/Trunk Assessment   Upper Extremity Assessment Upper Extremity Assessment: Defer to OT evaluation    Lower Extremity Assessment Lower Extremity Assessment: Generalized weakness    Cervical / Trunk Assessment Cervical / Trunk Assessment: Normal  Communication   Communication Communication: Impaired Factors Affecting Communication: Reduced clarity of speech (not sure if difficulty with commands may be due to hearing issues or cognition)    Cognition Arousal: Alert Behavior During Therapy: Impulsive   PT - Cognitive impairments: History of cognitive impairments                         Following commands: Impaired Following commands impaired: Follows one step commands inconsistently, Follows one step commands with increased time     Cueing Cueing Techniques: Verbal cues, Tactile cues, Visual cues     General Comments General comments (skin integrity, edema, etc.): BP stable though lower after ambulation than prior, RN aware, HR max 120's    Exercises     Assessment/Plan    PT Assessment Patient needs continued PT services  PT Problem List Decreased balance;Decreased cognition;Decreased safety awareness;Decreased knowledge of use of DME;Decreased mobility;Decreased activity tolerance       PT Treatment Interventions DME instruction;Functional mobility training;Balance training;Patient/family education;Gait training;Therapeutic exercise;Therapeutic activities;Stair training    PT Goals (Current goals can be found in the Care Plan section)  Acute Rehab PT Goals Patient Stated Goal: unable to state PT Goal Formulation: Patient unable to participate in goal setting Time For Goal Achievement: 07/17/24 Potential to Achieve Goals: Good    Frequency Min 2X/week     Co-evaluation PT/OT/SLP Co-Evaluation/Treatment: Yes   PT goals addressed during session: Mobility/safety with mobility;Balance;Proper use of DME         AM-PAC PT  6 Clicks Mobility  Outcome Measure Help needed turning from your back to your side while in a flat bed without using bedrails?: A Little Help needed moving from lying on your back to sitting on the side of a flat bed without using bedrails?: A Little Help needed moving to and from a bed to a chair (including a wheelchair)?: A Little Help needed standing up from a chair using your arms (e.g., wheelchair or bedside chair)?: A Little Help needed to walk in hospital room?: A Lot Help needed climbing 3-5 steps with a railing? : Total 6 Click Score: 15    End of Session Equipment Utilized During Treatment: Gait belt Activity Tolerance: Patient tolerated treatment well Patient left: in chair;with call bell/phone within reach;with chair alarm set        Time: 0932-1000 PT Time Calculation (min) (ACUTE ONLY): 28 min   Charges:   PT Evaluation $PT Eval Moderate Complexity: 1 Mod   PT General Charges $$ ACUTE PT VISIT: 1 Visit         Micheline Portal, PT Acute Rehabilitation Services Office:[phone] 07/03/2024   Montie Portal 07/03/2024, 12:07 PM

## 2024-07-03 NOTE — TOC Progression Note (Signed)
 Transition of Care [facility]) - Progression Note    Patient Details  Name: Jordan Kennedy MRN: [id number] Date of Birth: 1956-03-27  Transition of [facility]) CM/SW Contact  Lauraine FORBES Saa, LCSWA Phone Number: 07/03/2024, 12:55 PM  Clinical Narrative:     12:55 PM Per chart review, therapy recommended patient discharge to SNF. CSW sent patient's FL2 to [facility] as requested by patient's legal guardian/APS social worker, [name]. CSW will continue to follow and be available to assist.  Expected Discharge Plan: Skilled Nursing Facility Barriers to Discharge: Continued Medical Work up, English as a second language teacher               Expected Discharge Plan and Services In-house Referral: Clinical Social Work   Post Acute Care Choice: Skilled Nursing Facility Living arrangements for the past 2 months: Group Home (Alternative Family Living)                                       Social Drivers of Health (SDOH) Interventions SDOH Screenings   Food Insecurity: Patient Unable To Answer (07/02/2024)  Housing: Patient Unable To Answer (07/02/2024)  Transportation Needs: Patient Unable To Answer (07/02/2024)  Utilities: Patient Unable To Answer (07/02/2024)  Alcohol Screen: Low Risk  (11/05/2023)  Depression (PHQ2-9): Low Risk  (11/05/2023)  Financial Resource Strain: Low Risk  (11/05/2023)  Physical Activity: Inactive (11/05/2023)  Social Connections: Patient Unable To Answer (07/02/2024)  Stress: No Stress Concern Present (11/05/2023)  Tobacco Use: Medium Risk (07/01/2024)  Health Literacy: Adequate Health Literacy (11/05/2023)    Readmission Risk Interventions    06/28/2024   11:41 AM 06/11/2024   10:27 AM  Readmission Risk Prevention Plan  Transportation Screening Complete Complete  PCP or Specialist Appt within 5-7 Days Complete   PCP or Specialist Appt within 3-5 Days  Complete  Home Care Screening Complete   Medication Review (RN CM) Complete   HRI or Home Care  Consult  Complete  Social Work Consult for Recovery Care Planning/Counseling  Complete  Palliative Care Screening  Not Applicable  Medication Review Oceanographer)  Complete

## 2024-07-03 NOTE — Evaluation (Signed)
 Occupational Therapy Evaluation Patient Details Name: Jordan Kennedy MRN: [id number] DOB: [birth year] Today's Date: 07/03/2024   History of Present Illness   Pt is a 68 y.o. with admission in August for septic shock and UTI and discharged 8/27 with an indwelling catheter. Admitted  9/5 for constipation and possible colitis and discharged  home on 9/7.  Admitted with hypotension, generalized weakness and abdominal pain.  Pt with sepsis and UTI. PMH: cognitive impairment, schizophrenia and intellectual disability , CHF, tardive dyskinesia, chronic constipation, anemia, hx of SBO, BPH.     Clinical Impressions PTA pt living in a group home with assistance form his caregiver. Pt with multiple recent hospitalizations and demonstrates generalized weakness, decreased independence with ADL tasks and increased risk for falls. Patient will benefit from continued inpatient follow up therapy, <3 hours/day to maximize functional level of independence. Acute OT to follow.  Orthostatic BPs  Supine 106/73  Sitting 117/82     Standing 104/71  After ambulation @ unit 93/75   No c/o dizziness throughout.      If plan is discharge home, recommend the following:   A little help with walking and/or transfers;A lot of help with bathing/dressing/bathroom;Direct supervision/assist for medications management;Direct supervision/assist for financial management;Assist for transportation;Help with stairs or ramp for entrance     Functional Status Assessment   Patient has had a recent decline in their functional status and demonstrates the ability to make significant improvements in function in a reasonable and predictable amount of time.     Equipment Recommendations   None recommended by OT     Recommendations for Other Services         Precautions/Restrictions   Precautions Precautions: Fall Recall of Precautions/Restrictions: Impaired Precaution/Restrictions Comments: foley     Mobility  Bed Mobility Overal bed mobility: Needs Assistance Bed Mobility: Supine to Sit     Supine to sit: Contact guard          Transfers Overall transfer level: Needs assistance Equipment used: Rolling walker (2 wheels) Transfers: Sit to/from Stand Sit to Stand: Contact guard assist                  Balance Overall balance assessment: Needs assistance   Sitting balance-Leahy Scale: Good     Standing balance support: Bilateral upper extremity supported Standing balance-Leahy Scale: Poor                             ADL either performed or assessed with clinical judgement   ADL Overall ADL's : Needs assistance/impaired Eating/Feeding: Set up   Grooming: Supervision/safety;Set up Grooming Details (indicate cue type and reason): difficulty with opening packages Upper Body Bathing: Minimal assistance   Lower Body Bathing: Moderate assistance;Sit to/from stand   Upper Body Dressing : Minimal assistance;Sitting   Lower Body Dressing: Moderate assistance;Sit to/from stand Lower Body Dressing Details (indicate cue type and reason): unable to donn/doff socks Toilet Transfer: Minimal assistance;+2 for safety/equipment   Toileting- Clothing Manipulation and Hygiene: Maximal assistance Toileting - Clothing Manipulation Details (indicate cue type and reason): foley; hx of bowel incontinence     Functional mobility during ADLs: Minimal assistance;Rolling walker (2 wheels);Cueing for safety;+2 for safety/equipment       Vision Patient Visual Report:  (will further assess)       Perception         Praxis         Pertinent Vitals/Pain Pain Assessment Pain Assessment: No/denies pain  Extremity/Trunk Assessment Upper Extremity Assessment Upper Extremity Assessment: Generalized weakness;Right hand dominant (limited B shoulder FF to @ 90; decreased in-hand manipulation skills/fine motor skills)   Lower Extremity Assessment Lower Extremity Assessment:  Defer to PT evaluation   Cervical / Trunk Assessment Cervical / Trunk Assessment: Normal   Communication Communication Communication: No apparent difficulties   Cognition Arousal: Alert Behavior During Therapy: Impulsive Cognition: History of cognitive impairments, No family/caregiver present to determine baseline, Cognition impaired           Executive functioning impairment (select all impairments): Sequencing, Organization, Problem solving OT - Cognition Comments: difficulty with initiation of tasks; requires increased time for prociessing; most likely close to baseline                 Following commands: Impaired Following commands impaired: Only follows one step commands consistently     Cueing  General Comments   Cueing Techniques: Verbal cues;Tactile cues;Visual cues      Exercises     Shoulder Instructions      Home Living Family/patient expects to be discharged to:: Group home Living Arrangements: Non-relatives/Friends Available Help at Discharge: Personal care attendant Type of Home: House                           Additional Comments: unsure of PLOF/ anticipate need for SNF      Prior Functioning/Environment Prior Level of Function : Needs assist;Patient poor historian/Family not available             Mobility Comments: states he does not use a RW at baseline ADLs Comments: suspect assist for majority of ADL/iADLs given baseline cognitive impairments    OT Problem List: Decreased strength;Decreased activity tolerance;Impaired balance (sitting and/or standing);Cardiopulmonary status limiting activity   OT Treatment/Interventions: Self-care/ADL training;Therapeutic exercise;Energy conservation;DME and/or AE instruction;Therapeutic activities;Cognitive remediation/compensation;Patient/family education;Balance training      OT Goals(Current goals can be found in the care plan section)   Acute Rehab OT Goals Patient Stated Goal:  eat/drink OT Goal Formulation: Patient unable to participate in goal setting Time For Goal Achievement: 07/17/24 Potential to Achieve Goals: Good   OT Frequency:  Min 2X/week    Co-evaluation              AM-PAC OT 6 Clicks Daily Activity     Outcome Measure Help from another person eating meals?: A Little Help from another person taking care of personal grooming?: A Little Help from another person toileting, which includes using toliet, bedpan, or urinal?: A Lot Help from another person bathing (including washing, rinsing, drying)?: A Lot Help from another person to put on and taking off regular upper body clothing?: A Little Help from another person to put on and taking off regular lower body clothing?: A Lot 6 Click Score: 15   End of Session Equipment Utilized During Treatment: Gait belt;Rolling walker (2 wheels) Nurse Communication: Mobility status  Activity Tolerance: Patient tolerated treatment well Patient left: in chair;with call bell/phone within reach;with chair alarm set  OT Visit Diagnosis: Unsteadiness on feet (R26.81);Other abnormalities of gait and mobility (R26.89);Muscle weakness (generalized) (M62.81);Other symptoms and signs involving cognitive function                Time: 0932-1000 OT Time Calculation (min): 28 min Charges:  OT General Charges $OT Visit: 1 Visit OT Evaluation $OT Eval Moderate Complexity: 1 Mod  Kemper Hochman, OT/L   Acute OT Clinical Specialist Acute Rehabilitation Services Pager [phone]  Office [phone]   Lowanda Cashaw,HILLARY 07/03/2024, 10:17 AM

## 2024-07-03 NOTE — NC FL2 (Signed)
 [place]  MEDICAID FL2 LEVEL OF CARE FORM     IDENTIFICATION  Patient Name: Jordan Kennedy Birthdate: 1955/11/08 Sex: male Admission Date (Current Location): 07/01/2024  [facility] and [place] Number:  Producer, television/film/video and Address:  [facility], [address], [place], [place] [address]      Provider Number: [id number]  Attending Physician Name and Address:  Donnamarie Lebron PARAS, MD  Relative Name and Phone Number:  [facility]; Legal Guardian; [phone]    Current Level of Care: Hospital Recommended Level of Care: Skilled Nursing Facility Prior Approval Number:    Date Approved/Denied:   PASRR Number: [id number] E Expired 07/12/2024  Discharge Plan: SNF    Current Diagnoses: Patient Active Problem List   Diagnosis Date Noted   Hypotension due to hypovolemia 07/01/2024   Sepsis due to gram-negative urinary tract infection (HCC) 07/01/2024   Sepsis (HCC) 07/01/2024   Sepsis due to undetermined organism (HCC) 06/26/2024   Elevated troponin 06/26/2024   Hyperglycemia 06/26/2024   Glucose intolerance (impaired glucose tolerance) 06/26/2024   UTI (urinary tract infection) 06/10/2024   Acute metabolic encephalopathy 06/10/2024   Hyponatremia 06/10/2024   Hydronephrosis due to obstruction of bladder 06/02/2024   Obstructive nephropathy due to benign prostatic hyperplasia 06/02/2024   Bladder outlet obstruction 05/15/2024   Enuresis 06/19/2023   Left bundle branch block 02/13/2023   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   Tinea pedis of both feet 01/11/2023   Chronic HFrEF (heart failure with reduced ejection fraction) (HCC) 01/03/2023   Prolonged QT interval 01/01/2023   Transaminitis 01/01/2023   Thrombocytosis 01/01/2023   Tardive dyskinesia 11/20/2021   BPH with obstruction/lower urinary tract symptoms 05/02/2021   History of small bowel obstruction 01/31/2021   History of colon polyps 01/31/2021    Onychomycosis 01/31/2021   Gastroesophageal reflux disease 01/31/2021   AKI (acute kidney injury) (HCC) 06/29/2018   Tobacco use disorder, mild, in sustained remission 02/01/2015   Constipation due to slow transit 04/25/2012   Anxiety and depression 04/25/2012   Edentulous 04/25/2012   Schizophrenia (HCC)    Intellectual disability     Orientation RESPIRATION BLADDER Height & Weight     Self, Time, Situation, Place  Normal (Room Air) Indwelling catheter, Continent Weight: 181 lb 3.5 oz (82.2 kg) Height:  6' 5 (195.6 cm)  BEHAVIORAL SYMPTOMS/MOOD NEUROLOGICAL BOWEL NUTRITION STATUS      Continent Diet (Please see discharge summary)  AMBULATORY STATUS COMMUNICATION OF NEEDS Skin   Limited Assist Verbally Normal                       Personal Care Assistance Level of Assistance  Bathing, Feeding, Dressing Bathing Assistance: Limited assistance Feeding assistance: Limited assistance Dressing Assistance: Limited assistance     Functional Limitations Info             SPECIAL CARE FACTORS FREQUENCY  PT (By licensed PT), OT (By licensed OT)     PT Frequency: 5x OT Frequency: 5x            Contractures Contractures Info: Not present    Additional Factors Info  Code Status, Allergies Code Status Info: Full Code Allergies Info: NKA           Current Medications (07/03/2024):  This is the current hospital active medication list Current Facility-Administered Medications  Medication Dose Route Frequency Provider Last Rate Last Admin   amantadine  (SYMMETREL ) capsule 100 mg  100  mg Oral QHS Alva, Rakesh V, MD       aspirin  EC tablet 81 mg  81 mg Oral Daily Georgina Basket, MD   81 mg at 07/03/24 0905   Chlorhexidine  Gluconate Cloth 2 % PADS 6 each  6 each Topical Daily Layman Raisin, DO   6 each at 07/02/24 2037   ciprofloxacin  (CIPRO ) tablet 500 mg  500 mg Oral Q12H Alva, Rakesh V, MD   500 mg at 07/03/24 1210   cloZAPine  (CLOZARIL ) tablet 200 mg  200 mg  Oral BID Georgina Basket, MD   200 mg at 07/03/24 9094   enoxaparin  (LOVENOX ) injection 40 mg  40 mg Subcutaneous Q24H Georgina Basket, MD   40 mg at 07/02/24 1430   finasteride  (PROSCAR ) tablet 5 mg  5 mg Oral Daily Georgina Basket, MD   5 mg at 07/03/24 9094   linaclotide  (LINZESS ) capsule 290 mcg  290 mcg Oral QAC breakfast Georgina Basket, MD   290 mcg at 07/03/24 9187   melatonin tablet 10 mg  10 mg Oral QHS Georgina Basket, MD   10 mg at 07/02/24 2132   midodrine  (PROAMATINE ) tablet 10 mg  10 mg Oral TID WC Georgina Basket, MD   10 mg at 07/03/24 1210   pantoprazole  (PROTONIX ) EC tablet 40 mg  40 mg Oral Daily Georgina Basket, MD   40 mg at 07/03/24 0905   PARoxetine  (PAXIL ) tablet 40 mg  40 mg Oral QPM Georgina Basket, MD   40 mg at 07/02/24 1725   polyethylene glycol (MIRALAX  / GLYCOLAX ) packet 17 g  17 g Oral BID Georgina Basket, MD   17 g at 07/03/24 9094   tamsulosin  (FLOMAX ) capsule 0.4 mg  0.4 mg Oral QPC supper Georgina Basket, MD   0.4 mg at 07/02/24 1725     Discharge Medications: Please see discharge summary for a list of discharge medications.  Relevant Imaging Results:  Relevant Lab Results:   Additional Information SSN: [id number]  Lauraine FORBES Saa, LCSWA

## 2024-07-03 NOTE — Plan of Care (Signed)

## 2024-07-03 NOTE — Progress Notes (Signed)
 PROGRESS NOTE  Jordan Kennedy:[id number] DOB: [birth year] DOA: 07/01/2024 PCP: [name] HERO, MD  HPI/Recap of past 24 hours: Jordan Kennedy is a 68 y.o. male with medical history significant of schizophrenia and intellectual disability, HFrEF (EF <20%), hx of BPH and bladder outlet obstruction w/ admission in 05/2024 for septic shock secondary to E. Coli and Enterobacter UTI and discharged 8/27 with an indwelling catheter, as well as recent admission on 9/5 for presumed colitis and discharged on midodrine , now p/w hypotension 2/2 sepsis of presumed urinary origin. Pt is a poor historian. PTA, was evaluated at OP cards office and noted to be hypotensive (60/40s) for which EMS was activated and pt BIBA to ED for further evaluation. In the ED, pt hypotensive 70/50s (MAP 60s), in the setting of low EF. Labs notable for  WBC 15.9 and Cr 1.3. UA pos for LE, but neg for nitrite. EDP consutled CCM who agreed with admission to ICU.  In the ICU, patient was started on Levophed , which was eventually titrated off with BP maintaining, although still soft.  Patient seems to be improving overall.  [facility] resumed care on 07/03/2024.     Today, patient denies any new complaints, met patient sitting in the chair.  PT/OT just finished working with him.  Patient denies any worsening shortness of breath, chest pain, abdominal pain, nausea/vomiting, fever/chills.  Patient able to communicate and voice needs.   Assessment/Plan: Principal Problem:   Sepsis due to gram-negative urinary tract infection (HCC) Active Problems:   Hypotension due to hypovolemia   Sepsis (HCC)   ?Sepsis shock of presumed urinary origin ?CAUTI Hypotension Currently afebrile, with leukocytosis UA with moderate leukocytes, WBC 21-50, none bacteria seen UCx no growth BC x 2 NGTD S/p IV Cefepime --> switch to Cipro  Continue midodrine  10mg  TID Monitor closely   HFrEF Last echo showed EF of less than 20%, noted regional  wall motion abnormality, left ventricular diastolic function could not be evaluated HOLD pta Entresto  Continue midodrine    BPH PTA finasteride  and tamsulosin    Schizophrenia PTA Clozaril  and Paxil     Estimated body mass index is 21.49 kg/m as calculated from the following:   Height as of this encounter: 6' 5 (1.956 m).   Weight as of this encounter: 82.2 kg.     Code Status: Full  Family Communication: None  Disposition Plan: Status is: Inpatient Remains inpatient appropriate because: level of care      Consultants: PCCM  Procedures: None  Antimicrobials: Cefepime -->Cipro   DVT prophylaxis:  Lovenox    Objective: Vitals:   07/03/24 0817 07/03/24 0900 07/03/24 1000 07/03/24 1100  BP:  94/73 (!) 110/91 (!) 83/68  Pulse:  (!) 106 (!) 115 (!) 113  Resp:  (!) 32 18 16  Temp: 99.1 F (37.3 C)   98.8 F (37.1 C)  TempSrc: Oral   Oral  SpO2:  94% 96% 96%  Weight:      Height:        Intake/Output Summary (Last 24 hours) at 07/03/2024 1343 Last data filed at 07/03/2024 1100 Gross per 24 hour  Intake 202.42 ml  Output 2675 ml  Net -2472.58 ml   Filed Weights   07/01/24 1010 07/03/24 0600  Weight: 78.5 kg 82.2 kg    Exam: General: NAD  Cardiovascular: S1, S2 present Respiratory: Diminished breath sounds bilaterally Abdomen: Soft, nontender, nondistended, bowel sounds present Musculoskeletal: No bilateral pedal edema noted Skin: Normal Psychiatry: Normal mood     Data Reviewed: CBC: Recent Labs  Lab  06/27/24 0303 07/01/24 1031 07/01/24 1055 07/02/24 0057 07/03/24 1223  WBC 12.2* 15.9*  --  11.7* 12.4*  NEUTROABS 6.9 12.1*  --   --  7.4  HGB 11.7* 11.5* 12.6* 10.3* 11.6*  HCT 37.6* 35.8* 37.0* 32.3* 36.8*  MCV 95.7 93.0  --  92.3 94.8  PLT 441* 408*  --  361 404*   Basic Metabolic Panel: Recent Labs  Lab 06/27/24 0303 07/01/24 1031 07/01/24 1055 07/02/24 0057 07/03/24 1223  NA 137 139 143 139 141  K 4.2 4.5 4.6 4.1 4.5  CL 103  111 110 107 111  CO2 23 23  --  23 21*  GLUCOSE 99 125* 126* 111* 103*  BUN 17 15 17 12 9   CREATININE 1.07 1.27* 1.30* 1.17 0.94  CALCIUM  9.0 8.8*  --  8.5* 9.0  MG  --   --   --  2.1 1.8  PHOS  --   --   --   --  2.7   GFR: Estimated Creatinine Clearance: 91.1 mL/min (by C-G formula based on SCr of 0.94 mg/dL). Liver Function Tests: Recent Labs  Lab 06/27/24 0303 07/01/24 1031  AST 19 24  ALT 13 21  ALKPHOS 131* 86  BILITOT 0.3 0.6  PROT 6.3* 6.2*  ALBUMIN  3.2* 2.6*   No results for input(s): LIPASE, AMYLASE in the last 168 hours. No results for input(s): AMMONIA in the last 168 hours. Coagulation Profile: Recent Labs  Lab 07/01/24 1031  INR 1.2   Cardiac Enzymes: No results for input(s): CKTOTAL, CKMB, CKMBINDEX, TROPONINI in the last 168 hours. BNP (last 3 results) No results for input(s): PROBNP in the last 8760 hours. HbA1C: No results for input(s): HGBA1C in the last 72 hours. CBG: Recent Labs  Lab 07/02/24 1950 07/02/24 2310 07/03/24 0416 07/03/24 0815 07/03/24 1207  GLUCAP 163* 114* 186* 106* 118*   Lipid Profile: No results for input(s): CHOL, HDL, LDLCALC, TRIG, CHOLHDL, LDLDIRECT in the last 72 hours. Thyroid Function Tests: No results for input(s): TSH, T4TOTAL, FREET4, T3FREE, THYROIDAB in the last 72 hours. Anemia Panel: No results for input(s): VITAMINB12, FOLATE, FERRITIN, TIBC, IRON, RETICCTPCT in the last 72 hours. Urine analysis:    Component Value Date/Time   COLORURINE YELLOW 07/01/2024 1031   APPEARANCEUR HAZY (A) 07/01/2024 1031   LABSPEC 1.023 07/01/2024 1031   PHURINE 5.0 07/01/2024 1031   GLUCOSEU >=500 (A) 07/01/2024 1031   HGBUR LARGE (A) 07/01/2024 1031   BILIRUBINUR NEGATIVE 07/01/2024 1031   BILIRUBINUR negative 11/25/2023 1027   BILIRUBINUR Negative 01/31/2015 1010   KETONESUR NEGATIVE 07/01/2024 1031   PROTEINUR 30 (A) 07/01/2024 1031   UROBILINOGEN 1.0 11/25/2023  1027   UROBILINOGEN 1.0 06/12/2012 1315   NITRITE NEGATIVE 07/01/2024 1031   LEUKOCYTESUR MODERATE (A) 07/01/2024 1031   Sepsis Labs: @LABRCNTIP (procalcitonin:4,lacticidven:4)  ) Recent Results (from the past 240 hours)  MRSA Next Gen by PCR, Nasal     Status: None   Collection Time: 06/26/24  4:23 PM   Specimen: Nasal Mucosa; Nasal Swab  Result Value Ref Range Status   MRSA by PCR Next Gen NOT DETECTED NOT DETECTED Final    Comment: (NOTE) The GeneXpert MRSA Assay (FDA approved for NASAL specimens only), is one component of a comprehensive MRSA colonization surveillance program. It is not intended to diagnose MRSA infection nor to guide or monitor treatment for MRSA infections. Test performance is not FDA approved in patients less than 13 years old. Performed at [facility], [address] [place], [place] [address]   Culture, blood (Routine X 2) w Reflex to ID Panel     Status: None   Collection Time: 06/26/24  5:32 PM   Specimen: BLOOD  Result Value Ref Range Status   Specimen Description   Final    BLOOD BLOOD LEFT ARM Performed at [facility], [address], [place], [place] [address]    Special Requests   Final    BOTTLES DRAWN AEROBIC AND ANAEROBIC Blood Culture adequate volume Performed at [facility], [address] [place], [place] [address]    Culture   Final    NO GROWTH 5 DAYS Performed at [facility], [address] [place], [place] [address]    Report Status 07/01/2024 FINAL  Final  Culture, blood (Routine X 2) w Reflex to ID Panel     Status: None   Collection Time: 06/26/24  5:34 PM   Specimen: BLOOD  Result Value Ref Range Status   Specimen Description   Final    BLOOD BLOOD LEFT ARM Performed at [facility], [address] [place] [address], [place], [place] [address]    Special Requests   Final    BOTTLES DRAWN AEROBIC AND ANAEROBIC Blood Culture adequate volume Performed at  [facility], [address], [place], [place] [address]    Culture   Final    NO GROWTH 5 DAYS Performed at [facility], [address] [place], [place] [address]    Report Status 07/01/2024 FINAL  Final  Remove and replace urinary cath (placed > 5 days) then obtain urine culture from new indwelling urinary catheter.     Status: None   Collection Time: 06/26/24  6:12 PM   Specimen: Urine, Catheterized  Result Value Ref Range Status   Specimen Description   Final    URINE, CATHETERIZED Performed at [facility], [address], [place], [place] [address]    Special Requests   Final    NONE Performed at [facility], [address], [place], [place] [address]    Culture   Final    NO GROWTH Performed at [facility], [address], [place], [place] [address]    Report Status 06/27/2024 FINAL  Final  Resp panel by RT-PCR (RSV, Flu A&B, Covid) Anterior Nasal Swab     Status: None   Collection Time: 07/01/24 10:31 AM   Specimen: Anterior Nasal Swab  Result Value Ref Range Status   SARS Coronavirus 2 by RT PCR NEGATIVE NEGATIVE Final   Influenza A by PCR NEGATIVE NEGATIVE Final   Influenza B by PCR NEGATIVE NEGATIVE Final    Comment: (NOTE) The Xpert Xpress SARS-CoV-2/FLU/RSV plus assay is intended as an aid in the diagnosis of influenza from Nasopharyngeal swab specimens and should not be used as a sole basis for treatment. Nasal washings and aspirates are unacceptable for Xpert Xpress SARS-CoV-2/FLU/RSV testing.  Fact Sheet for Patients: BloggerCourse.com  Fact Sheet for Healthcare Providers: SeriousBroker.it  This test is not yet approved or cleared by the [place]  FDA and has been authorized for detection and/or diagnosis of SARS-CoV-2 by FDA under an Emergency Use Authorization (EUA). This EUA will remain in effect (meaning this test can be used)  for the duration of the COVID-19 declaration under Section 564(b)(1) of the Act, 21 U.S.C. section 360bbb-3(b)(1), unless the authorization is terminated or revoked.     Resp Syncytial Virus by  PCR NEGATIVE NEGATIVE Final    Comment: (NOTE) Fact Sheet for Patients: BloggerCourse.com  Fact Sheet for Healthcare Providers: SeriousBroker.it  This test is not yet approved or cleared by the [place]  FDA and has been authorized for detection and/or diagnosis of SARS-CoV-2 by FDA under an Emergency Use Authorization (EUA). This EUA will remain in effect (meaning this test can be used) for the duration of the COVID-19 declaration under Section 564(b)(1) of the Act, 21 U.S.C. section 360bbb-3(b)(1), unless the authorization is terminated or revoked.  Performed at [facility], [address], [place], [place] [address]   Urine Culture     Status: None   Collection Time: 07/01/24 10:31 AM   Specimen: Urine, Random  Result Value Ref Range Status   Specimen Description URINE, RANDOM  Final   Special Requests NONE Reflexed from [id number]  Final   Culture   Final    NO GROWTH Performed at [facility], [address] [place], [place] [address]    Report Status 07/03/2024 FINAL  Final  Blood Culture (routine x 2)     Status: None (Preliminary result)   Collection Time: 07/01/24 10:49 AM   Specimen: BLOOD RIGHT HAND  Result Value Ref Range Status   Specimen Description BLOOD RIGHT HAND  Final   Special Requests   Final    BOTTLES DRAWN AEROBIC AND ANAEROBIC Blood Culture adequate volume   Culture   Final    NO GROWTH 2 DAYS Performed at [facility], [address] [place], [place] [address]    Report Status PENDING  Incomplete  Blood Culture (routine x 2)     Status: None (Preliminary result)   Collection Time: 07/01/24 10:49 AM   Specimen: BLOOD LEFT FOREARM  Result Value Ref Range Status   Specimen  Description BLOOD LEFT FOREARM  Final   Special Requests   Final    BOTTLES DRAWN AEROBIC AND ANAEROBIC Blood Culture adequate volume   Culture   Final    NO GROWTH 2 DAYS Performed at [facility], [address], [place], [place] [address]    Report Status PENDING  Incomplete  MRSA Next Gen by PCR, Nasal     Status: None   Collection Time: 07/02/24 12:22 AM   Specimen: Nasal Mucosa; Nasal Swab  Result Value Ref Range Status   MRSA by PCR Next Gen NOT DETECTED NOT DETECTED Final    Comment: (NOTE) The GeneXpert MRSA Assay (FDA approved for NASAL specimens only), is one component of a comprehensive MRSA colonization surveillance program. It is not intended to diagnose MRSA infection nor to guide or monitor treatment for MRSA infections. Test performance is not FDA approved in patients less than 17 years old. Performed at [facility], [address] [place], [place] [address]   Urine Culture (for pregnant, neutropenic or urologic patients or patients with an indwelling urinary catheter)     Status: None   Collection Time: 07/02/24 11:19 AM   Specimen: Urine, Catheterized  Result Value Ref Range Status   Specimen Description URINE, CATHETERIZED  Final   Special Requests Normal  Final   Culture   Final    NO GROWTH Performed at [facility], [address] [place], [place] [address]    Report Status 07/03/2024 FINAL  Final      Studies: No results found.  Scheduled Meds:  amantadine   100 mg Oral QHS   aspirin  EC  81  mg Oral Daily   Chlorhexidine  Gluconate Cloth  6 each Topical Daily   ciprofloxacin   500 mg Oral Q12H   cloZAPine   200 mg Oral BID   enoxaparin  (LOVENOX ) injection  40 mg Subcutaneous Q24H   finasteride   5 mg Oral Daily   linaclotide   290 mcg Oral QAC breakfast   melatonin  10 mg Oral QHS   midodrine   10 mg Oral TID WC   pantoprazole   40 mg Oral Daily   PARoxetine   40 mg Oral QPM   polyethylene glycol  17 g Oral BID   tamsulosin   0.4  mg Oral QPC supper    Continuous Infusions:   LOS: 2 days     Lebron JINNY Cage, MD [facility]  If 7PM-7AM, please contact night-coverage [email or web address] 07/03/2024, 1:43 PM

## 2024-07-04 DIAGNOSIS — N390 Urinary tract infection, site not specified: Secondary | ICD-10-CM | POA: Diagnosis not present

## 2024-07-04 DIAGNOSIS — A4150 Gram-negative sepsis, unspecified: Secondary | ICD-10-CM | POA: Diagnosis not present

## 2024-07-04 LAB — CBC WITH DIFFERENTIAL/PLATELET
Abs Immature Granulocytes: 0.05 K/uL (ref 0.00–0.07)
Basophils Absolute: 0.1 K/uL (ref 0.0–0.1)
Basophils Relative: 1 %
Eosinophils Absolute: 0.7 K/uL — ABNORMAL HIGH (ref 0.0–0.5)
Eosinophils Relative: 5 %
HCT: 35.7 % — ABNORMAL LOW (ref 39.0–52.0)
Hemoglobin: 11.2 g/dL — ABNORMAL LOW (ref 13.0–17.0)
Immature Granulocytes: 0 %
Lymphocytes Relative: 25 %
Lymphs Abs: 3.2 K/uL (ref 0.7–4.0)
MCH: 29.6 pg (ref 26.0–34.0)
MCHC: 31.4 g/dL (ref 30.0–36.0)
MCV: 94.2 fL (ref 80.0–100.0)
Monocytes Absolute: 1.6 K/uL — ABNORMAL HIGH (ref 0.1–1.0)
Monocytes Relative: 12 %
Neutro Abs: 7.5 K/uL (ref 1.7–7.7)
Neutrophils Relative %: 57 %
Platelets: 354 K/uL (ref 150–400)
RBC: 3.79 MIL/uL — ABNORMAL LOW (ref 4.22–5.81)
RDW: 17.8 % — ABNORMAL HIGH (ref 11.5–15.5)
WBC: 13.2 K/uL — ABNORMAL HIGH (ref 4.0–10.5)
nRBC: 0 % (ref 0.0–0.2)

## 2024-07-04 LAB — BASIC METABOLIC PANEL WITH GFR
Anion gap: 13 (ref 5–15)
BUN: 11 mg/dL (ref 8–23)
CO2: 20 mmol/L — ABNORMAL LOW (ref 22–32)
Calcium: 8.7 mg/dL — ABNORMAL LOW (ref 8.9–10.3)
Chloride: 108 mmol/L (ref 98–111)
Creatinine, Ser: 0.95 mg/dL (ref 0.61–1.24)
GFR, Estimated: >60 mL/min (ref >60)
Glucose, Bld: 109 mg/dL — ABNORMAL HIGH (ref 70–99)
Potassium: 5.4 mmol/L — ABNORMAL HIGH (ref 3.5–5.1)
Sodium: 141 mmol/L (ref 135–145)

## 2024-07-04 LAB — GLUCOSE, CAPILLARY: Glucose-Capillary: 146 mg/dL — ABNORMAL HIGH (ref 70–99)

## 2024-07-04 MED ORDER — SENNOSIDES-DOCUSATE SODIUM 8.6-50 MG PO TABS
1.0000 | ORAL_TABLET | Freq: Two times a day (BID) | ORAL | Status: DC
  Administered 2024-07-04 – 2024-07-11 (×14): 1 via ORAL
  Filled 2024-07-04 (×15): qty 1

## 2024-07-04 NOTE — Progress Notes (Signed)
 PROGRESS NOTE  Jordan Kennedy FMW:[id number] DOB: Oct 18, 1956 DOA: 07/01/2024 PCP: [name] HERO, MD  HPI/Recap of past 24 hours: Jordan Kennedy is a 68 y.o. male with medical history significant of schizophrenia and intellectual disability, HFrEF (EF <20%), hx of BPH and bladder outlet obstruction w/ admission in 05/2024 for septic shock secondary to E. Coli and Enterobacter UTI and discharged 8/27 with an indwelling catheter, as well as recent admission on 9/5 for presumed colitis and discharged on midodrine , now p/w hypotension 2/2 sepsis of presumed urinary origin. Pt is a poor historian. PTA, was evaluated at OP cards office and noted to be hypotensive (60/40s) for which EMS was activated and pt BIBA to ED for further evaluation. In the ED, pt hypotensive 70/50s (MAP 60s), in the setting of low EF. Labs notable for  WBC 15.9 and Cr 1.3. UA pos for LE, but neg for nitrite. EDP consutled CCM who agreed with admission to ICU.  In the ICU, patient was started on Levophed , which was eventually titrated off with BP maintaining, although still soft.  Patient seems to be improving overall.  [facility] resumed care on 07/03/2024.     Today, patient denies any new complaints.  Noted to still be tachycardic with runs of nonsustained V. Tach, asymptomatic.   Assessment/Plan: Principal Problem:   Sepsis due to gram-negative urinary tract infection (HCC) Active Problems:   Hypotension due to hypovolemia   Sepsis (HCC)   ?Sepsis shock of presumed urinary origin ?CAUTI Hypotension-required pressors in ICU, weaned off Currently afebrile, with chronic leukocytosis UA with moderate leukocytes, WBC 21-50, none bacteria seen UCx no growth BC x 2 NGTD S/p IV Cefepime --> switch to Cipro  as per PCCM Continue midodrine  10mg  TID Monitor closely   HFrEF Noted persistent tachycardia Last echo showed EF of less than 20%, noted regional wall motion abnormality, left ventricular diastolic function  could not be evaluated HOLD pta Entresto  due to hypotension Continue midodrine   Mild hyperkalemia Potassium 5.4, noted hemolysis Repeat BMP a.m.   BPH PTA finasteride  and tamsulosin    Schizophrenia PTA Clozaril  and Paxil     Estimated body mass index is 21.49 kg/m as calculated from the following:   Height as of this encounter: 6' 5 (1.956 m).   Weight as of this encounter: 82.2 kg.     Code Status: Full  Family Communication: Attempted to call legal guardian on 9/13, no answer  Disposition Plan: Status is: Inpatient Remains inpatient appropriate because: level of care      Consultants: PCCM  Procedures: None  Antimicrobials: Cefepime -->Cipro   DVT prophylaxis:  Lovenox    Objective: Vitals:   07/04/24 0017 07/04/24 0603 07/04/24 0832 07/04/24 1005  BP: 104/77 102/80 116/86 104/68  Pulse: (!) 107 (!) 107 (!) 109 (!) 112  Resp: 19 19 15 17   Temp: 99.1 F (37.3 C) 98.7 F (37.1 C) 98.8 F (37.1 C)   TempSrc: Oral Oral    SpO2: 98% 100% 100% 99%  Weight:      Height:        Intake/Output Summary (Last 24 hours) at 07/04/2024 1211 Last data filed at 07/03/2024 2154 Gross per 24 hour  Intake --  Output 675 ml  Net -675 ml   Filed Weights   07/01/24 1010 07/03/24 0600  Weight: 78.5 kg 82.2 kg    Exam: General: NAD  Cardiovascular: S1, S2 present Respiratory: Diminished breath sounds bilaterally Abdomen: Soft, nontender, nondistended, bowel sounds present Musculoskeletal: No bilateral pedal edema noted Skin: Normal Psychiatry: Stable mood  Data Reviewed: CBC: Recent Labs  Lab 07/01/24 1031 07/01/24 1055 07/02/24 0057 07/03/24 1223 07/04/24 0517  WBC 15.9*  --  11.7* 12.4* 13.2*  NEUTROABS 12.1*  --   --  7.4 7.5  HGB 11.5* 12.6* 10.3* 11.6* 11.2*  HCT 35.8* 37.0* 32.3* 36.8* 35.7*  MCV 93.0  --  92.3 94.8 94.2  PLT 408*  --  361 404* 354   Basic Metabolic Panel: Recent Labs  Lab 07/01/24 1031 07/01/24 1055 07/02/24 0057  07/03/24 1223 07/04/24 0517  NA 139 143 139 141 141  K 4.5 4.6 4.1 4.5 5.4*  CL 111 110 107 111 108  CO2 23  --  23 21* 20*  GLUCOSE 125* 126* 111* 103* 109*  BUN 15 17 12 9 11   CREATININE 1.27* 1.30* 1.17 0.94 0.95  CALCIUM  8.8*  --  8.5* 9.0 8.7*  MG  --   --  2.1 1.8  --   PHOS  --   --   --  2.7  --    GFR: Estimated Creatinine Clearance: 90.1 mL/min (by C-G formula based on SCr of 0.95 mg/dL). Liver Function Tests: Recent Labs  Lab 07/01/24 1031  AST 24  ALT 21  ALKPHOS 86  BILITOT 0.6  PROT 6.2*  ALBUMIN  2.6*   No results for input(s): LIPASE, AMYLASE in the last 168 hours. No results for input(s): AMMONIA in the last 168 hours. Coagulation Profile: Recent Labs  Lab 07/01/24 1031  INR 1.2   Cardiac Enzymes: No results for input(s): CKTOTAL, CKMB, CKMBINDEX, TROPONINI in the last 168 hours. BNP (last 3 results) No results for input(s): PROBNP in the last 8760 hours. HbA1C: No results for input(s): HGBA1C in the last 72 hours. CBG: Recent Labs  Lab 07/03/24 0815 07/03/24 1207 07/03/24 1541 07/03/24 2014 07/04/24 0834  GLUCAP 106* 118* 113* 116* 146*   Lipid Profile: No results for input(s): CHOL, HDL, LDLCALC, TRIG, CHOLHDL, LDLDIRECT in the last 72 hours. Thyroid Function Tests: No results for input(s): TSH, T4TOTAL, FREET4, T3FREE, THYROIDAB in the last 72 hours. Anemia Panel: No results for input(s): VITAMINB12, FOLATE, FERRITIN, TIBC, IRON, RETICCTPCT in the last 72 hours. Urine analysis:    Component Value Date/Time   COLORURINE YELLOW 07/01/2024 1031   APPEARANCEUR HAZY (A) 07/01/2024 1031   LABSPEC 1.023 07/01/2024 1031   PHURINE 5.0 07/01/2024 1031   GLUCOSEU >=500 (A) 07/01/2024 1031   HGBUR LARGE (A) 07/01/2024 1031   BILIRUBINUR NEGATIVE 07/01/2024 1031   BILIRUBINUR negative 11/25/2023 1027   BILIRUBINUR Negative 01/31/2015 1010   KETONESUR NEGATIVE 07/01/2024 1031   PROTEINUR 30  (A) 07/01/2024 1031   UROBILINOGEN 1.0 11/25/2023 1027   UROBILINOGEN 1.0 06/12/2012 1315   NITRITE NEGATIVE 07/01/2024 1031   LEUKOCYTESUR MODERATE (A) 07/01/2024 1031   Sepsis Labs: @LABRCNTIP (procalcitonin:4,lacticidven:4)  ) Recent Results (from the past 240 hours)  MRSA Next Gen by PCR, Nasal     Status: None   Collection Time: 06/26/24  4:23 PM   Specimen: Nasal Mucosa; Nasal Swab  Result Value Ref Range Status   MRSA by PCR Next Gen NOT DETECTED NOT DETECTED Final    Comment: (NOTE) The GeneXpert MRSA Assay (FDA approved for NASAL specimens only), is one component of a comprehensive MRSA colonization surveillance program. It is not intended to diagnose MRSA infection nor to guide or monitor treatment for MRSA infections. Test performance is not FDA approved in patients less than 15 years old. Performed at [facility], [address] [place], [place] [address]   Culture, blood (Routine X 2) w Reflex to ID Panel     Status: None   Collection Time: 06/26/24  5:32 PM   Specimen: BLOOD  Result Value Ref Range Status   Specimen Description   Final    BLOOD BLOOD LEFT ARM Performed at [facility], [address], [place], [place] [address]    Special Requests   Final    BOTTLES DRAWN AEROBIC AND ANAEROBIC Blood Culture adequate volume Performed at [facility], [address], [place], [place] [address]    Culture   Final    NO GROWTH 5 DAYS Performed at [facility], [address], [place], [place] [address]    Report Status 07/01/2024 FINAL  Final  Culture, blood (Routine X 2) w Reflex to ID Panel     Status: None   Collection Time: 06/26/24  5:34 PM   Specimen: BLOOD  Result Value Ref Range Status   Specimen Description   Final    BLOOD BLOOD LEFT ARM Performed at [facility], [address], [place], [place] [address]    Special Requests   Final    BOTTLES DRAWN AEROBIC AND  ANAEROBIC Blood Culture adequate volume Performed at [facility], [address], [place], [place] [address]    Culture   Final    NO GROWTH 5 DAYS Performed at [facility], [address] [place], [place] [address]    Report Status 07/01/2024 FINAL  Final  Remove and replace urinary cath (placed > 5 days) then obtain urine culture from new indwelling urinary catheter.     Status: None   Collection Time: 06/26/24  6:12 PM   Specimen: Urine, Catheterized  Result Value Ref Range Status   Specimen Description   Final    URINE, CATHETERIZED Performed at [facility], [address] [place], [place] [address]    Special Requests   Final    NONE Performed at [facility], [address], [place], [place] [address]    Culture   Final    NO GROWTH Performed at [facility], [address] [place], [place] [address]    Report Status 06/27/2024 FINAL  Final  Resp panel by RT-PCR (RSV, Flu A&B, Covid) Anterior Nasal Swab     Status: None   Collection Time: 07/01/24 10:31 AM   Specimen: Anterior Nasal Swab  Result Value Ref Range Status   SARS Coronavirus 2 by RT PCR NEGATIVE NEGATIVE Final   Influenza A by PCR NEGATIVE NEGATIVE Final   Influenza B by PCR NEGATIVE NEGATIVE Final    Comment: (NOTE) The Xpert Xpress SARS-CoV-2/FLU/RSV plus assay is intended as an aid in the diagnosis of influenza from Nasopharyngeal swab specimens and should not be used as a sole basis for treatment. Nasal washings and aspirates are unacceptable for Xpert Xpress SARS-CoV-2/FLU/RSV testing.  Fact Sheet for Patients: BloggerCourse.com  Fact Sheet for Healthcare Providers: SeriousBroker.it  This test is not yet approved or cleared by the [place]  FDA and has been authorized for detection and/or diagnosis of SARS-CoV-2 by FDA under an Emergency Use Authorization (EUA). This EUA  will remain in effect (meaning this test can be used) for the duration of the COVID-19 declaration under Section 564(b)(1) of the Act, 21 U.S.C. section 360bbb-3(b)(1), unless the authorization is terminated or revoked.     Resp Syncytial Virus by PCR  NEGATIVE NEGATIVE Final    Comment: (NOTE) Fact Sheet for Patients: BloggerCourse.com  Fact Sheet for Healthcare Providers: SeriousBroker.it  This test is not yet approved or cleared by the [place]  FDA and has been authorized for detection and/or diagnosis of SARS-CoV-2 by FDA under an Emergency Use Authorization (EUA). This EUA will remain in effect (meaning this test can be used) for the duration of the COVID-19 declaration under Section 564(b)(1) of the Act, 21 U.S.C. section 360bbb-3(b)(1), unless the authorization is terminated or revoked.  Performed at [facility], [address] [place], [place] [address]   Urine Culture     Status: None   Collection Time: 07/01/24 10:31 AM   Specimen: Urine, Random  Result Value Ref Range Status   Specimen Description URINE, RANDOM  Final   Special Requests NONE Reflexed from [id number]  Final   Culture   Final    NO GROWTH Performed at [facility], [address] [place], [place] [address]    Report Status 07/03/2024 FINAL  Final  Blood Culture (routine x 2)     Status: None (Preliminary result)   Collection Time: 07/01/24 10:49 AM   Specimen: BLOOD RIGHT HAND  Result Value Ref Range Status   Specimen Description BLOOD RIGHT HAND  Final   Special Requests   Final    BOTTLES DRAWN AEROBIC AND ANAEROBIC Blood Culture adequate volume   Culture   Final    NO GROWTH 3 DAYS Performed at [facility], [address], [place], [place] [address]    Report Status PENDING  Incomplete  Blood Culture (routine x 2)     Status: None (Preliminary result)   Collection Time: 07/01/24 10:49 AM   Specimen: BLOOD LEFT FOREARM   Result Value Ref Range Status   Specimen Description BLOOD LEFT FOREARM  Final   Special Requests   Final    BOTTLES DRAWN AEROBIC AND ANAEROBIC Blood Culture adequate volume   Culture   Final    NO GROWTH 3 DAYS Performed at [facility], [address] [place], [place] [address]    Report Status PENDING  Incomplete  MRSA Next Gen by PCR, Nasal     Status: None   Collection Time: 07/02/24 12:22 AM   Specimen: Nasal Mucosa; Nasal Swab  Result Value Ref Range Status   MRSA by PCR Next Gen NOT DETECTED NOT DETECTED Final    Comment: (NOTE) The GeneXpert MRSA Assay (FDA approved for NASAL specimens only), is one component of a comprehensive MRSA colonization surveillance program. It is not intended to diagnose MRSA infection nor to guide or monitor treatment for MRSA infections. Test performance is not FDA approved in patients less than 89 years old. Performed at [facility], [address], [place], [place] [address]   Urine Culture (for pregnant, neutropenic or urologic patients or patients with an indwelling urinary catheter)     Status: None   Collection Time: 07/02/24 11:19 AM   Specimen: Urine, Catheterized  Result Value Ref Range Status   Specimen Description URINE, CATHETERIZED  Final   Special Requests Normal  Final   Culture   Final    NO GROWTH Performed at [facility], [address] [place], [place] [address]    Report Status 07/03/2024 FINAL  Final      Studies: No results found.  Scheduled Meds:  amantadine   100 mg Oral QHS   aspirin  EC  81 mg  Oral Daily   Chlorhexidine  Gluconate Cloth  6 each Topical Daily   ciprofloxacin   500 mg Oral Q12H   cloZAPine   200 mg Oral BID   enoxaparin  (LOVENOX ) injection  40 mg Subcutaneous Q24H   finasteride   5 mg Oral Daily   linaclotide   290 mcg Oral QAC breakfast   melatonin  10 mg Oral QHS   midodrine   10 mg Oral TID WC   pantoprazole   40 mg Oral Daily   PARoxetine   40 mg Oral QPM    polyethylene glycol  17 g Oral BID   senna-docusate  1 tablet Oral BID   tamsulosin   0.4 mg Oral QPC supper    Continuous Infusions:   LOS: 3 days     Lebron JINNY Cage, MD [facility]  If 7PM-7AM, please contact night-coverage [email or web address] 07/04/2024, 12:11 PM

## 2024-07-04 NOTE — Plan of Care (Signed)

## 2024-07-04 NOTE — Plan of Care (Signed)

## 2024-07-05 DIAGNOSIS — A4150 Gram-negative sepsis, unspecified: Secondary | ICD-10-CM | POA: Diagnosis not present

## 2024-07-05 DIAGNOSIS — N390 Urinary tract infection, site not specified: Secondary | ICD-10-CM | POA: Diagnosis not present

## 2024-07-05 LAB — CBC WITH DIFFERENTIAL/PLATELET
Abs Immature Granulocytes: 0.05 K/uL (ref 0.00–0.07)
Basophils Absolute: 0.1 K/uL (ref 0.0–0.1)
Basophils Relative: 1 %
Eosinophils Absolute: 0.6 K/uL — ABNORMAL HIGH (ref 0.0–0.5)
Eosinophils Relative: 5 %
HCT: 35.7 % — ABNORMAL LOW (ref 39.0–52.0)
Hemoglobin: 11.6 g/dL — ABNORMAL LOW (ref 13.0–17.0)
Immature Granulocytes: 0 %
Lymphocytes Relative: 22 %
Lymphs Abs: 2.6 K/uL (ref 0.7–4.0)
MCH: 30.3 pg (ref 26.0–34.0)
MCHC: 32.5 g/dL (ref 30.0–36.0)
MCV: 93.2 fL (ref 80.0–100.0)
Monocytes Absolute: 1.1 K/uL — ABNORMAL HIGH (ref 0.1–1.0)
Monocytes Relative: 10 %
Neutro Abs: 7.1 K/uL (ref 1.7–7.7)
Neutrophils Relative %: 62 %
Platelets: 370 K/uL (ref 150–400)
RBC: 3.83 MIL/uL — ABNORMAL LOW (ref 4.22–5.81)
RDW: 17.2 % — ABNORMAL HIGH (ref 11.5–15.5)
WBC: 11.5 K/uL — ABNORMAL HIGH (ref 4.0–10.5)
nRBC: 0 % (ref 0.0–0.2)

## 2024-07-05 LAB — BASIC METABOLIC PANEL WITH GFR
Anion gap: 10 (ref 5–15)
BUN: 10 mg/dL (ref 8–23)
CO2: 21 mmol/L — ABNORMAL LOW (ref 22–32)
Calcium: 9.2 mg/dL (ref 8.9–10.3)
Chloride: 108 mmol/L (ref 98–111)
Creatinine, Ser: 1.06 mg/dL (ref 0.61–1.24)
GFR, Estimated: >60 mL/min (ref >60)
Glucose, Bld: 104 mg/dL — ABNORMAL HIGH (ref 70–99)
Potassium: 4.5 mmol/L (ref 3.5–5.1)
Sodium: 139 mmol/L (ref 135–145)

## 2024-07-05 LAB — GLUCOSE, CAPILLARY: Glucose-Capillary: 100 mg/dL — ABNORMAL HIGH (ref 70–99)

## 2024-07-05 MED ORDER — HALOPERIDOL LACTATE 5 MG/ML IJ SOLN
1.0000 mg | Freq: Four times a day (QID) | INTRAMUSCULAR | Status: DC | PRN
  Administered 2024-07-05: 1 mg via INTRAVENOUS
  Filled 2024-07-05: qty 1

## 2024-07-05 MED ORDER — DIPHENHYDRAMINE HCL 50 MG/ML IJ SOLN
25.0000 mg | Freq: Once | INTRAMUSCULAR | Status: AC
  Administered 2024-07-05: 25 mg via INTRAVENOUS
  Filled 2024-07-05: qty 1

## 2024-07-05 MED ORDER — LORAZEPAM 2 MG/ML IJ SOLN
1.0000 mg | Freq: Four times a day (QID) | INTRAMUSCULAR | Status: DC | PRN
  Administered 2024-07-05 – 2024-07-07 (×3): 1 mg via INTRAVENOUS
  Filled 2024-07-05 (×3): qty 1

## 2024-07-05 NOTE — Plan of Care (Signed)

## 2024-07-05 NOTE — Progress Notes (Signed)
 PROGRESS NOTE  Jordan Kennedy FMW:[id number] DOB: 1956-03-26 DOA: 07/01/2024 PCP: [name] HERO, MD  HPI/Recap of past 24 hours: Jordan Kennedy is a 68 y.o. male with medical history significant of schizophrenia and intellectual disability, HFrEF (EF <20%), hx of BPH and bladder outlet obstruction w/ admission in 05/2024 for septic shock secondary to E. Coli and Enterobacter UTI and discharged 8/27 with an indwelling catheter, as well as recent admission on 9/5 for presumed colitis and discharged on midodrine , now p/w hypotension 2/2 sepsis of presumed urinary origin. Pt is a poor historian. PTA, was evaluated at OP cards office and noted to be hypotensive (60/40s) for which EMS was activated and pt BIBA to ED for further evaluation. In the ED, pt hypotensive 70/50s (MAP 60s), in the setting of low EF. Labs notable for  WBC 15.9 and Cr 1.3. UA pos for LE, but neg for nitrite. EDP consutled CCM who agreed with admission to ICU.  In the ICU, patient was started on Levophed , which was eventually titrated off with BP maintaining, although still soft.  Patient seems to be improving overall.  [facility] resumed care on 07/03/2024.     Today, patient denied any new complaints.  Continues to ask for multiple snacks    Assessment/Plan: Principal Problem:   Sepsis due to gram-negative urinary tract infection (HCC) Active Problems:   Hypotension due to hypovolemia   Sepsis (HCC)   ?Sepsis shock of presumed urinary origin ?CAUTI Hypotension-required pressors in ICU, weaned off Currently afebrile, with chronic leukocytosis UA with moderate leukocytes, WBC 21-50, none bacteria seen UCx no growth BC x 2 NGTD S/p IV Cefepime --> switch to Cipro  as per PCCM Continue midodrine  10mg  TID Monitor closely   HFrEF Noted persistent tachycardia Last echo showed EF of less than 20%, noted regional wall motion abnormality, left ventricular diastolic function could not be evaluated HOLD pta Entresto   due to hypotension Continue midodrine    BPH PTA finasteride  and tamsulosin    Schizophrenia PTA Clozaril  and Paxil   Goals of care discussion Patient with poor prognosis, frequent admission, end-stage EF Attempted to call caregiver to discuss goals of care on 9/13     Estimated body mass index is 21.49 kg/m as calculated from the following:   Height as of this encounter: 6' 5 (1.956 m).   Weight as of this encounter: 82.2 kg.     Code Status: Full  Family Communication: Attempted to call legal guardian on 9/13, no answer  Disposition Plan: Status is: Inpatient Remains inpatient appropriate because: level of care      Consultants: PCCM  Procedures: None  Antimicrobials: Cefepime -->Cipro   DVT prophylaxis:  Lovenox    Objective: Vitals:   07/05/24 0037 07/05/24 0511 07/05/24 0850 07/05/24 1229  BP: 103/76 113/78 102/73 116/87  Pulse: (!) 106 (!) 108 100 100  Resp: 17 19 18 17   Temp: 98.3 F (36.8 C) 98.6 F (37 C) 98.8 F (37.1 C) 98 F (36.7 C)  TempSrc:   Oral Oral  SpO2: 100% 99% 100% 100%  Weight:      Height:        Intake/Output Summary (Last 24 hours) at 07/05/2024 1631 Last data filed at 07/05/2024 1033 Gross per 24 hour  Intake 236 ml  Output 2450 ml  Net -2214 ml   Filed Weights   07/01/24 1010 07/03/24 0600  Weight: 78.5 kg 82.2 kg    Exam: General: NAD  Cardiovascular: S1, S2 present Respiratory: Diminished breath sounds bilaterally Abdomen: Soft, nontender, nondistended, bowel sounds  present Musculoskeletal: No bilateral pedal edema noted Skin: Normal Psychiatry: Stable mood     Data Reviewed: CBC: Recent Labs  Lab 07/01/24 1031 07/01/24 1055 07/02/24 0057 07/03/24 1223 07/04/24 0517 07/05/24 0613  WBC 15.9*  --  11.7* 12.4* 13.2* 11.5*  NEUTROABS 12.1*  --   --  7.4 7.5 7.1  HGB 11.5* 12.6* 10.3* 11.6* 11.2* 11.6*  HCT 35.8* 37.0* 32.3* 36.8* 35.7* 35.7*  MCV 93.0  --  92.3 94.8 94.2 93.2  PLT 408*  --  361  404* 354 370   Basic Metabolic Panel: Recent Labs  Lab 07/01/24 1031 07/01/24 1055 07/02/24 0057 07/03/24 1223 07/04/24 0517 07/05/24 0613  NA 139 143 139 141 141 139  K 4.5 4.6 4.1 4.5 5.4* 4.5  CL 111 110 107 111 108 108  CO2 23  --  23 21* 20* 21*  GLUCOSE 125* 126* 111* 103* 109* 104*  BUN 15 17 12 9 11 10   CREATININE 1.27* 1.30* 1.17 0.94 0.95 1.06  CALCIUM  8.8*  --  8.5* 9.0 8.7* 9.2  MG  --   --  2.1 1.8  --   --   PHOS  --   --   --  2.7  --   --    GFR: Estimated Creatinine Clearance: 80.8 mL/min (by C-G formula based on SCr of 1.06 mg/dL). Liver Function Tests: Recent Labs  Lab 07/01/24 1031  AST 24  ALT 21  ALKPHOS 86  BILITOT 0.6  PROT 6.2*  ALBUMIN  2.6*   No results for input(s): LIPASE, AMYLASE in the last 168 hours. No results for input(s): AMMONIA in the last 168 hours. Coagulation Profile: Recent Labs  Lab 07/01/24 1031  INR 1.2   Cardiac Enzymes: No results for input(s): CKTOTAL, CKMB, CKMBINDEX, TROPONINI in the last 168 hours. BNP (last 3 results) No results for input(s): PROBNP in the last 8760 hours. HbA1C: No results for input(s): HGBA1C in the last 72 hours. CBG: Recent Labs  Lab 07/03/24 0815 07/03/24 1207 07/03/24 1541 07/03/24 2014 07/04/24 0834  GLUCAP 106* 118* 113* 116* 146*   Lipid Profile: No results for input(s): CHOL, HDL, LDLCALC, TRIG, CHOLHDL, LDLDIRECT in the last 72 hours. Thyroid Function Tests: No results for input(s): TSH, T4TOTAL, FREET4, T3FREE, THYROIDAB in the last 72 hours. Anemia Panel: No results for input(s): VITAMINB12, FOLATE, FERRITIN, TIBC, IRON, RETICCTPCT in the last 72 hours. Urine analysis:    Component Value Date/Time   COLORURINE YELLOW 07/01/2024 1031   APPEARANCEUR HAZY (A) 07/01/2024 1031   LABSPEC 1.023 07/01/2024 1031   PHURINE 5.0 07/01/2024 1031   GLUCOSEU >=500 (A) 07/01/2024 1031   HGBUR LARGE (A) 07/01/2024 1031    BILIRUBINUR NEGATIVE 07/01/2024 1031   BILIRUBINUR negative 11/25/2023 1027   BILIRUBINUR Negative 01/31/2015 1010   KETONESUR NEGATIVE 07/01/2024 1031   PROTEINUR 30 (A) 07/01/2024 1031   UROBILINOGEN 1.0 11/25/2023 1027   UROBILINOGEN 1.0 06/12/2012 1315   NITRITE NEGATIVE 07/01/2024 1031   LEUKOCYTESUR MODERATE (A) 07/01/2024 1031   Sepsis Labs: @LABRCNTIP (procalcitonin:4,lacticidven:4)  ) Recent Results (from the past 240 hours)  MRSA Next Gen by PCR, Nasal     Status: None   Collection Time: 06/26/24  4:23 PM   Specimen: Nasal Mucosa; Nasal Swab  Result Value Ref Range Status   MRSA by PCR Next Gen NOT DETECTED NOT DETECTED Final    Comment: (NOTE) The GeneXpert MRSA Assay (FDA approved for NASAL specimens only), is one component of a comprehensive MRSA colonization surveillance  program. It is not intended to diagnose MRSA infection nor to guide or monitor treatment for MRSA infections. Test performance is not FDA approved in patients less than 17 years old. Performed at [facility], [address] [place] [address], [place], [place] [address]   Culture, blood (Routine X 2) w Reflex to ID Panel     Status: None   Collection Time: 06/26/24  5:32 PM   Specimen: BLOOD  Result Value Ref Range Status   Specimen Description   Final    BLOOD BLOOD LEFT ARM Performed at [facility], [address], [place], [place] [address]    Special Requests   Final    BOTTLES DRAWN AEROBIC AND ANAEROBIC Blood Culture adequate volume Performed at [facility], [address] [place], [place] [address]    Culture   Final    NO GROWTH 5 DAYS Performed at [facility], [address], [place], [place] [address]    Report Status 07/01/2024 FINAL  Final  Culture, blood (Routine X 2) w Reflex to ID Panel     Status: None   Collection Time: 06/26/24  5:34 PM   Specimen: BLOOD  Result Value Ref Range Status   Specimen Description   Final     BLOOD BLOOD LEFT ARM Performed at [facility], [address] [place], [place] [address]    Special Requests   Final    BOTTLES DRAWN AEROBIC AND ANAEROBIC Blood Culture adequate volume Performed at [facility], [address], [place], [place] [address]    Culture   Final    NO GROWTH 5 DAYS Performed at [facility], [address], [place], [place] [address]    Report Status 07/01/2024 FINAL  Final  Remove and replace urinary cath (placed > 5 days) then obtain urine culture from new indwelling urinary catheter.     Status: None   Collection Time: 06/26/24  6:12 PM   Specimen: Urine, Catheterized  Result Value Ref Range Status   Specimen Description   Final    URINE, CATHETERIZED Performed at [facility], [address], [place], [place] [address]    Special Requests   Final    NONE Performed at [facility], [address], [place], [place] [address]    Culture   Final    NO GROWTH Performed at [facility], [address], [place], [place] [address]    Report Status 06/27/2024 FINAL  Final  Resp panel by RT-PCR (RSV, Flu A&B, Covid) Anterior Nasal Swab     Status: None   Collection Time: 07/01/24 10:31 AM   Specimen: Anterior Nasal Swab  Result Value Ref Range Status   SARS Coronavirus 2 by RT PCR NEGATIVE NEGATIVE Final   Influenza A by PCR NEGATIVE NEGATIVE Final   Influenza B by PCR NEGATIVE NEGATIVE Final    Comment: (NOTE) The Xpert Xpress SARS-CoV-2/FLU/RSV plus assay is intended as an aid in the diagnosis of influenza from Nasopharyngeal swab specimens and should not be used as a sole basis for treatment. Nasal washings and aspirates are unacceptable for Xpert Xpress SARS-CoV-2/FLU/RSV testing.  Fact Sheet for Patients: BloggerCourse.com  Fact Sheet for Healthcare Providers: SeriousBroker.it  This test is not yet  approved or cleared by the [place]  FDA and has been authorized for detection and/or diagnosis of SARS-CoV-2 by FDA under an Emergency Use Authorization (EUA). This EUA will remain in  effect (meaning this test can be used) for the duration of the COVID-19 declaration under Section 564(b)(1) of the Act, 21 U.S.C. section 360bbb-3(b)(1), unless the authorization is terminated or revoked.     Resp Syncytial Virus by PCR NEGATIVE NEGATIVE Final    Comment: (NOTE) Fact Sheet for Patients: BloggerCourse.com  Fact Sheet for Healthcare Providers: SeriousBroker.it  This test is not yet approved or cleared by the [place]  FDA and has been authorized for detection and/or diagnosis of SARS-CoV-2 by FDA under an Emergency Use Authorization (EUA). This EUA will remain in effect (meaning this test can be used) for the duration of the COVID-19 declaration under Section 564(b)(1) of the Act, 21 U.S.C. section 360bbb-3(b)(1), unless the authorization is terminated or revoked.  Performed at [facility], [address], [place], [place] [address]   Urine Culture     Status: None   Collection Time: 07/01/24 10:31 AM   Specimen: Urine, Random  Result Value Ref Range Status   Specimen Description URINE, RANDOM  Final   Special Requests NONE Reflexed from [id number]  Final   Culture   Final    NO GROWTH Performed at [facility], [address], [place], [place] [address]    Report Status 07/03/2024 FINAL  Final  Blood Culture (routine x 2)     Status: None (Preliminary result)   Collection Time: 07/01/24 10:49 AM   Specimen: BLOOD RIGHT HAND  Result Value Ref Range Status   Specimen Description BLOOD RIGHT HAND  Final   Special Requests   Final    BOTTLES DRAWN AEROBIC AND ANAEROBIC Blood Culture adequate volume   Culture   Final    NO GROWTH 4 DAYS Performed at [facility], [address] [place], [place]  [address]    Report Status PENDING  Incomplete  Blood Culture (routine x 2)     Status: None (Preliminary result)   Collection Time: 07/01/24 10:49 AM   Specimen: BLOOD LEFT FOREARM  Result Value Ref Range Status   Specimen Description BLOOD LEFT FOREARM  Final   Special Requests   Final    BOTTLES DRAWN AEROBIC AND ANAEROBIC Blood Culture adequate volume   Culture   Final    NO GROWTH 4 DAYS Performed at [facility], [address], [place], [place] [address]    Report Status PENDING  Incomplete  MRSA Next Gen by PCR, Nasal     Status: None   Collection Time: 07/02/24 12:22 AM   Specimen: Nasal Mucosa; Nasal Swab  Result Value Ref Range Status   MRSA by PCR Next Gen NOT DETECTED NOT DETECTED Final    Comment: (NOTE) The GeneXpert MRSA Assay (FDA approved for NASAL specimens only), is one component of a comprehensive MRSA colonization surveillance program. It is not intended to diagnose MRSA infection nor to guide or monitor treatment for MRSA infections. Test performance is not FDA approved in patients less than 22 years old. Performed at [facility], [address] [place], [place] [address]   Urine Culture (for pregnant, neutropenic or urologic patients or patients with an indwelling urinary catheter)     Status: None   Collection Time: 07/02/24 11:19 AM   Specimen: Urine, Catheterized  Result Value Ref Range Status   Specimen Description URINE, CATHETERIZED  Final   Special Requests Normal  Final   Culture   Final    NO GROWTH Performed at [facility], [address], [place], [place] [address]    Report Status 07/03/2024 FINAL  Final      Studies: No results found.  Scheduled Meds:  amantadine   100 mg Oral QHS   aspirin  EC  81 mg Oral Daily   Chlorhexidine  Gluconate Cloth  6 each Topical Daily   ciprofloxacin   500 mg Oral Q12H   cloZAPine   200 mg Oral BID   enoxaparin  (LOVENOX ) injection  40 mg Subcutaneous Q24H   finasteride   5 mg Oral  Daily   linaclotide   290 mcg Oral QAC breakfast   melatonin  10 mg Oral QHS   midodrine   10 mg Oral TID WC   pantoprazole   40 mg Oral Daily   PARoxetine   40 mg Oral QPM   polyethylene glycol  17 g Oral BID   senna-docusate  1 tablet Oral BID   tamsulosin   0.4 mg Oral QPC supper    Continuous Infusions:   LOS: 4 days     Lebron JINNY Cage, MD [facility]  If 7PM-7AM, please contact night-coverage [email or web address] 07/05/2024, 4:31 PM

## 2024-07-05 NOTE — Plan of Care (Signed)

## 2024-07-05 NOTE — Plan of Care (Addendum)
 Acute metabolic encephalopathy Patient is agitated restless.  Haldol  and Ativan  is not helpful.  Giving 1 dose of Benadryl  and implementing wrist restraint. If it does not help next step to give start Seroquel.   RN also reported that patient pulled out the Foley catheter and having a small clot and bleeding per penis.  After giving the Haldol  Ativan  plan to replace the Foley catheter.  Recommended to get an bladder scan to make sure there is no urinary retention that is causing restlessness. Also recommended if unsuccessful to place the Foley catheter can call urology on-call nurse to help to place the Foley.  Jordan Arocho, MD [facility] 07/05/2024, 8:40 PM

## 2024-07-06 DIAGNOSIS — A4150 Gram-negative sepsis, unspecified: Secondary | ICD-10-CM | POA: Diagnosis not present

## 2024-07-06 DIAGNOSIS — N390 Urinary tract infection, site not specified: Secondary | ICD-10-CM | POA: Diagnosis not present

## 2024-07-06 LAB — CBC WITH DIFFERENTIAL/PLATELET
Abs Immature Granulocytes: 0.07 K/uL (ref 0.00–0.07)
Basophils Absolute: 0.1 K/uL (ref 0.0–0.1)
Basophils Relative: 1 %
Eosinophils Absolute: 0.6 K/uL — ABNORMAL HIGH (ref 0.0–0.5)
Eosinophils Relative: 4 %
HCT: 35.1 % — ABNORMAL LOW (ref 39.0–52.0)
Hemoglobin: 11.3 g/dL — ABNORMAL LOW (ref 13.0–17.0)
Immature Granulocytes: 1 %
Lymphocytes Relative: 24 %
Lymphs Abs: 3 K/uL (ref 0.7–4.0)
MCH: 29.4 pg (ref 26.0–34.0)
MCHC: 32.2 g/dL (ref 30.0–36.0)
MCV: 91.2 fL (ref 80.0–100.0)
Monocytes Absolute: 1.4 K/uL — ABNORMAL HIGH (ref 0.1–1.0)
Monocytes Relative: 11 %
Neutro Abs: 7.5 K/uL (ref 1.7–7.7)
Neutrophils Relative %: 59 %
Platelets: 401 K/uL — ABNORMAL HIGH (ref 150–400)
RBC: 3.85 MIL/uL — ABNORMAL LOW (ref 4.22–5.81)
RDW: 17.1 % — ABNORMAL HIGH (ref 11.5–15.5)
WBC: 12.6 K/uL — ABNORMAL HIGH (ref 4.0–10.5)
nRBC: 0 % (ref 0.0–0.2)

## 2024-07-06 LAB — BASIC METABOLIC PANEL WITH GFR
Anion gap: 11 (ref 5–15)
BUN: 16 mg/dL (ref 8–23)
CO2: 24 mmol/L (ref 22–32)
Calcium: 9.3 mg/dL (ref 8.9–10.3)
Chloride: 105 mmol/L (ref 98–111)
Creatinine, Ser: 1.11 mg/dL (ref 0.61–1.24)
GFR, Estimated: >60 mL/min (ref >60)
Glucose, Bld: 110 mg/dL — ABNORMAL HIGH (ref 70–99)
Potassium: 4.2 mmol/L (ref 3.5–5.1)
Sodium: 140 mmol/L (ref 135–145)

## 2024-07-06 LAB — CULTURE, BLOOD (ROUTINE X 2)
Culture: NO GROWTH
Culture: NO GROWTH
Special Requests: ADEQUATE
Special Requests: ADEQUATE

## 2024-07-06 NOTE — Plan of Care (Signed)

## 2024-07-06 NOTE — Plan of Care (Signed)
  Problem: Clinical Measurements: Goal: Ability to maintain clinical measurements within normal limits will improve Outcome: Progressing Goal: Will remain free from infection Outcome: Progressing Goal: Diagnostic test results will improve Outcome: Progressing Goal: Respiratory complications will improve Outcome: Progressing Goal: Cardiovascular complication will be avoided Outcome: Progressing   Problem: Elimination: Goal: Will not experience complications related to bowel motility Outcome: Progressing Goal: Will not experience complications related to urinary retention Outcome: Progressing   Problem: Pain Managment: Goal: General experience of comfort will improve and/or be controlled Outcome: Progressing

## 2024-07-06 NOTE — Progress Notes (Signed)
 PROGRESS NOTE  [name] FMW:[id number] DOB: Jan 01, 1956 DOA: 07/01/2024 PCP: [name] HERO, MD  HPI/Recap of past 24 hours: Jordan Kennedy is a 68 y.o. male with medical history significant of schizophrenia and intellectual disability, HFrEF (EF <20%), hx of BPH and bladder outlet obstruction w/ admission in 05/2024 for septic shock secondary to E. Coli and Enterobacter UTI and discharged 8/27 with an indwelling catheter, as well as recent admission on 9/5 for presumed colitis and discharged on midodrine , now p/w hypotension 2/2 sepsis of presumed urinary origin. Pt is a poor historian. PTA, was evaluated at OP cards office and noted to be hypotensive (60/40s) for which EMS was activated and pt BIBA to ED for further evaluation. In the ED, pt hypotensive 70/50s (MAP 60s), in the setting of low EF. Labs notable for  WBC 15.9 and Cr 1.3. UA pos for LE, but neg for nitrite. EDP consutled CCM who agreed with admission to ICU.  In the ICU, patient was started on Levophed , which was eventually titrated off with BP maintaining, although still soft.  Patient seems to be improving overall.  [facility] resumed care on 07/03/2024.     Overnight, patient noted to be agitated, restless, pulled out his Foley catheter with noted small clot and bleeding around penis.  Was given Haldol  and Ativan .  This a.m., noted to be more sleepy, but awakes easily.  Appears comfortable.    Assessment/Plan: Principal Problem:   Sepsis due to gram-negative urinary tract infection (HCC) Active Problems:   Hypotension due to hypovolemia   Sepsis (HCC)   ?Sepsis shock of presumed urinary origin ?CAUTI Hypotension-required pressors in ICU, weaned off Currently afebrile, with chronic leukocytosis UA with moderate leukocytes, WBC 21-50, none bacteria seen UCx no growth BC x 2 NGTD S/p IV Cefepime --> switch to Cipro  as per PCCM Continue midodrine  10mg  TID Monitor closely   HFrEF Noted persistent  tachycardia Last echo showed EF of less than 20%, noted regional wall motion abnormality, left ventricular diastolic function could not be evaluated HOLD pta Entresto  due to hypotension Continue midodrine    BPH PTA finasteride  and tamsulosin    Schizophrenia PTA Clozaril  and Paxil   Goals of care discussion Patient with poor prognosis, frequent admission, end-stage EF Attempted to call caregiver to discuss goals of care on 9/13     Estimated body mass index is 21.49 kg/m as calculated from the following:   Height as of this encounter: 6' 5 (1.956 m).   Weight as of this encounter: 82.2 kg.     Code Status: Full  Family Communication: Attempted to call legal guardian on 9/13, no answer  Disposition Plan: Status is: Inpatient Remains inpatient appropriate because: level of care      Consultants: PCCM  Procedures: None  Antimicrobials: Cefepime -->Cipro   DVT prophylaxis:  Lovenox    Objective: Vitals:   07/06/24 0436 07/06/24 0753 07/06/24 1153 07/06/24 1614  BP: 114/86 117/84 107/82 91/69  Pulse: (!) 108 (!) 105 (!) 106 (!) 108  Resp: 16 18 18 17   Temp: 98.1 F (36.7 C) 97.8 F (36.6 C) 98.7 F (37.1 C) 98.4 F (36.9 C)  TempSrc:  Oral Oral Oral  SpO2: 99% 96% 98% 98%  Weight:      Height:        Intake/Output Summary (Last 24 hours) at 07/06/2024 1812 Last data filed at 07/06/2024 1157 Gross per 24 hour  Intake --  Output 2550 ml  Net -2550 ml   Filed Weights   07/01/24 1010 07/03/24 0600  Weight: 78.5 kg 82.2 kg    Exam: General: NAD  Cardiovascular: S1, S2 present Respiratory: Diminished breath sounds bilaterally Abdomen: Soft, nontender, nondistended, bowel sounds present Musculoskeletal: No bilateral pedal edema noted Skin: Normal Psychiatry: Stable mood     Data Reviewed: CBC: Recent Labs  Lab 07/01/24 1031 07/01/24 1055 07/02/24 0057 07/03/24 1223 07/04/24 0517 07/05/24 0613 07/06/24 0433  WBC 15.9*  --  11.7* 12.4*  13.2* 11.5* 12.6*  NEUTROABS 12.1*  --   --  7.4 7.5 7.1 7.5  HGB 11.5*   < > 10.3* 11.6* 11.2* 11.6* 11.3*  HCT 35.8*   < > 32.3* 36.8* 35.7* 35.7* 35.1*  MCV 93.0  --  92.3 94.8 94.2 93.2 91.2  PLT 408*  --  361 404* 354 370 401*   < > = values in this interval not displayed.   Basic Metabolic Panel: Recent Labs  Lab 07/02/24 0057 07/03/24 1223 07/04/24 0517 07/05/24 0613 07/06/24 0433  NA 139 141 141 139 140  K 4.1 4.5 5.4* 4.5 4.2  CL 107 111 108 108 105  CO2 23 21* 20* 21* 24  GLUCOSE 111* 103* 109* 104* 110*  BUN 12 9 11 10 16   CREATININE 1.17 0.94 0.95 1.06 1.11  CALCIUM  8.5* 9.0 8.7* 9.2 9.3  MG 2.1 1.8  --   --   --   PHOS  --  2.7  --   --   --    GFR: Estimated Creatinine Clearance: 77.1 mL/min (by C-G formula based on SCr of 1.11 mg/dL). Liver Function Tests: Recent Labs  Lab 07/01/24 1031  AST 24  ALT 21  ALKPHOS 86  BILITOT 0.6  PROT 6.2*  ALBUMIN  2.6*   No results for input(s): LIPASE, AMYLASE in the last 168 hours. No results for input(s): AMMONIA in the last 168 hours. Coagulation Profile: Recent Labs  Lab 07/01/24 1031  INR 1.2   Cardiac Enzymes: No results for input(s): CKTOTAL, CKMB, CKMBINDEX, TROPONINI in the last 168 hours. BNP (last 3 results) No results for input(s): PROBNP in the last 8760 hours. HbA1C: No results for input(s): HGBA1C in the last 72 hours. CBG: Recent Labs  Lab 07/03/24 1207 07/03/24 1541 07/03/24 2014 07/04/24 0834 07/05/24 2211  GLUCAP 118* 113* 116* 146* 100*   Lipid Profile: No results for input(s): CHOL, HDL, LDLCALC, TRIG, CHOLHDL, LDLDIRECT in the last 72 hours. Thyroid Function Tests: No results for input(s): TSH, T4TOTAL, FREET4, T3FREE, THYROIDAB in the last 72 hours. Anemia Panel: No results for input(s): VITAMINB12, FOLATE, FERRITIN, TIBC, IRON, RETICCTPCT in the last 72 hours. Urine analysis:    Component Value Date/Time   COLORURINE  YELLOW 07/01/2024 1031   APPEARANCEUR HAZY (A) 07/01/2024 1031   LABSPEC 1.023 07/01/2024 1031   PHURINE 5.0 07/01/2024 1031   GLUCOSEU >=500 (A) 07/01/2024 1031   HGBUR LARGE (A) 07/01/2024 1031   BILIRUBINUR NEGATIVE 07/01/2024 1031   BILIRUBINUR negative 11/25/2023 1027   BILIRUBINUR Negative 01/31/2015 1010   KETONESUR NEGATIVE 07/01/2024 1031   PROTEINUR 30 (A) 07/01/2024 1031   UROBILINOGEN 1.0 11/25/2023 1027   UROBILINOGEN 1.0 06/12/2012 1315   NITRITE NEGATIVE 07/01/2024 1031   LEUKOCYTESUR MODERATE (A) 07/01/2024 1031   Sepsis Labs: @LABRCNTIP (procalcitonin:4,lacticidven:4)  ) Recent Results (from the past 240 hours)  Resp panel by RT-PCR (RSV, Flu A&B, Covid) Anterior Nasal Swab     Status: None   Collection Time: 07/01/24 10:31 AM   Specimen: Anterior Nasal Swab  Result Value Ref Range Status  SARS Coronavirus 2 by RT PCR NEGATIVE NEGATIVE Final   Influenza A by PCR NEGATIVE NEGATIVE Final   Influenza B by PCR NEGATIVE NEGATIVE Final    Comment: (NOTE) The Xpert Xpress SARS-CoV-2/FLU/RSV plus assay is intended as an aid in the diagnosis of influenza from Nasopharyngeal swab specimens and should not be used as a sole basis for treatment. Nasal washings and aspirates are unacceptable for Xpert Xpress SARS-CoV-2/FLU/RSV testing.  Fact Sheet for Patients: BloggerCourse.com  Fact Sheet for Healthcare Providers: SeriousBroker.it  This test is not yet approved or cleared by the [place]  FDA and has been authorized for detection and/or diagnosis of SARS-CoV-2 by FDA under an Emergency Use Authorization (EUA). This EUA will remain in effect (meaning this test can be used) for the duration of the COVID-19 declaration under Section 564(b)(1) of the Act, 21 U.S.C. section 360bbb-3(b)(1), unless the authorization is terminated or revoked.     Resp Syncytial Virus by PCR NEGATIVE NEGATIVE Final    Comment:  (NOTE) Fact Sheet for Patients: BloggerCourse.com  Fact Sheet for Healthcare Providers: SeriousBroker.it  This test is not yet approved or cleared by the [place]  FDA and has been authorized for detection and/or diagnosis of SARS-CoV-2 by FDA under an Emergency Use Authorization (EUA). This EUA will remain in effect (meaning this test can be used) for the duration of the COVID-19 declaration under Section 564(b)(1) of the Act, 21 U.S.C. section 360bbb-3(b)(1), unless the authorization is terminated or revoked.  Performed at [facility], [address] [place], [place] [address]   Urine Culture     Status: None   Collection Time: 07/01/24 10:31 AM   Specimen: Urine, Random  Result Value Ref Range Status   Specimen Description URINE, RANDOM  Final   Special Requests NONE Reflexed from [id number]  Final   Culture   Final    NO GROWTH Performed at [facility], [address] [place], [place] [address]    Report Status 07/03/2024 FINAL  Final  Blood Culture (routine x 2)     Status: None   Collection Time: 07/01/24 10:49 AM   Specimen: BLOOD RIGHT HAND  Result Value Ref Range Status   Specimen Description BLOOD RIGHT HAND  Final   Special Requests   Final    BOTTLES DRAWN AEROBIC AND ANAEROBIC Blood Culture adequate volume   Culture   Final    NO GROWTH 5 DAYS Performed at [facility], [address], [place], [place] [address]    Report Status 07/06/2024 FINAL  Final  Blood Culture (routine x 2)     Status: None   Collection Time: 07/01/24 10:49 AM   Specimen: BLOOD LEFT FOREARM  Result Value Ref Range Status   Specimen Description BLOOD LEFT FOREARM  Final   Special Requests   Final    BOTTLES DRAWN AEROBIC AND ANAEROBIC Blood Culture adequate volume   Culture   Final    NO GROWTH 5 DAYS Performed at [facility], [address] [place], [place] [address]    Report Status 07/06/2024 FINAL   Final  MRSA Next Gen by PCR, Nasal     Status: None   Collection Time: 07/02/24 12:22 AM   Specimen: Nasal Mucosa; Nasal Swab  Result Value Ref Range Status   MRSA by PCR Next Gen NOT DETECTED NOT DETECTED Final    Comment: (NOTE) The GeneXpert MRSA Assay (FDA approved for NASAL specimens only), is one component of a  comprehensive MRSA colonization surveillance program. It is not intended to diagnose MRSA infection nor to guide or monitor treatment for MRSA infections. Test performance is not FDA approved in patients less than 103 years old. Performed at [facility], [address] [place], [place] [address]   Urine Culture (for pregnant, neutropenic or urologic patients or patients with an indwelling urinary catheter)     Status: None   Collection Time: 07/02/24 11:19 AM   Specimen: Urine, Catheterized  Result Value Ref Range Status   Specimen Description URINE, CATHETERIZED  Final   Special Requests Normal  Final   Culture   Final    NO GROWTH Performed at [facility], [address], [place], [place] [address]    Report Status 07/03/2024 FINAL  Final      Studies: No results found.  Scheduled Meds:  amantadine   100 mg Oral QHS   aspirin  EC  81 mg Oral Daily   Chlorhexidine  Gluconate Cloth  6 each Topical Daily   cloZAPine   200 mg Oral BID   enoxaparin  (LOVENOX ) injection  40 mg Subcutaneous Q24H   finasteride   5 mg Oral Daily   linaclotide   290 mcg Oral QAC breakfast   melatonin  10 mg Oral QHS   midodrine   10 mg Oral TID WC   pantoprazole   40 mg Oral Daily   PARoxetine   40 mg Oral QPM   polyethylene glycol  17 g Oral BID   senna-docusate  1 tablet Oral BID   tamsulosin   0.4 mg Oral QPC supper    Continuous Infusions:   LOS: 5 days     Lebron JINNY Cage, MD [facility]  If 7PM-7AM, please contact night-coverage [email or web address] 07/06/2024, 6:12 PM

## 2024-07-06 NOTE — Care Management Important Message (Signed)
 Important Message  Patient Details  Name: Keylor Rands MRN: [id number] Date of Birth: [birth year]   Important Message Given:  Yes - Medicare IM     Claretta Deed 07/06/2024, 12:57 PM

## 2024-07-06 NOTE — Progress Notes (Signed)
 Physical Therapy Treatment Patient Details Name: Jordan Kennedy MRN: [id number] DOB: 07-11-1956 Today's Date: 07/06/2024   History of Present Illness Pt is a 68 y.o. with admission in August for septic shock and UTI and discharged 8/27 with an indwelling catheter. Admitted  9/5 for constipation and possible colitis and discharged  home on 9/7.  Admitted 9/11 with hypotension, generalized weakness and abdominal pain.  Pt with sepsis and UTI. PMH: cognitive impairment, schizophrenia and intellectual disability , CHF, tardive dyskinesia, chronic constipation, anemia, hx of SBO, BPH.    PT Comments  Patient continues with initial imbalance and poor motor planning today with posterior bias on EOB and continued in standing for a while with improvement over time while ambulating.  Noted BP stable with mobility (97/71 supine, 108/71 seated and 103/73 standing).  Patient continues to perseverate on food ate two cups of ice cream during session and asking for fish ordered for lunch with cake.  RN aware.  PT will continue to follow.  Remains appropriate for inpatient rehab (<3 hours/day) at d/c.    If plan is discharge home, recommend the following: A little help with walking and/or transfers;Help with stairs or ramp for entrance;A little help with bathing/dressing/bathroom;Assistance with cooking/housework   Can travel by private vehicle     Yes  Equipment Recommendations  None recommended by PT    Recommendations for Other Services       Precautions / Restrictions Precautions Precautions: Fall Recall of Precautions/Restrictions: Impaired Precaution/Restrictions Comments: foley     Mobility  Bed Mobility Overal bed mobility: Needs Assistance Bed Mobility: Rolling, Sidelying to Sit Rolling: Min assist, Used rails Sidelying to sit: Mod assist, Used rails   Sit to supine: Mod assist   General bed mobility comments: assist for initiation, to lift trunk to sit and for guiding legs beck into bed  after session.  to supine assist for legs onto bed.    Transfers Overall transfer level: Needs assistance Equipment used: Rolling walker (2 wheels) Transfers: Sit to/from Stand Sit to Stand: Contact guard assist                Ambulation/Gait Ambulation/Gait assistance: Min assist Gait Distance (Feet): 150 Feet Assistive device: Rolling walker (2 wheels) Gait Pattern/deviations: Wide base of support, Leaning posteriorly, Decreased stride length       General Gait Details: initially leaning posterior, improved with ambulation   Stairs             Wheelchair Mobility     Tilt Bed    Modified Rankin (Stroke Patients Only)       Balance   Sitting-balance support: Feet supported, Feet unsupported Sitting balance-Leahy Scale: Zero Sitting balance - Comments: leaning back despite cue to stay sitting, min A For anterior weight shift Postural control: Posterior lean Standing balance support: Bilateral upper extremity supported Standing balance-Leahy Scale: Poor                              Communication Communication Communication: No apparent difficulties  Cognition Arousal: Alert Behavior During Therapy: Impulsive   PT - Cognitive impairments: History of cognitive impairments                       PT - Cognition Comments: asking for food throughout Following commands: Impaired Following commands impaired: Follows one step commands inconsistently, Follows one step commands with increased time    Cueing Cueing Techniques: Verbal cues, Gestural  cues  Exercises      General Comments General comments (skin integrity, edema, etc.): Perseverating on ordering food      Pertinent Vitals/Pain Pain Assessment Pain Assessment: Faces    Home Living                          Prior Function            PT Goals (current goals can now be found in the care plan section) Progress towards PT goals: Progressing toward goals     Frequency    Min 2X/week      PT Plan      Co-evaluation              AM-PAC PT 6 Clicks Mobility   Outcome Measure  Help needed turning from your back to your side while in a flat bed without using bedrails?: A Little Help needed moving from lying on your back to sitting on the side of a flat bed without using bedrails?: A Little Help needed moving to and from a bed to a chair (including a wheelchair)?: A Little Help needed standing up from a chair using your arms (e.g., wheelchair or bedside chair)?: A Little Help needed to walk in hospital room?: A Little Help needed climbing 3-5 steps with a railing? : Total 6 Click Score: 16    End of Session Equipment Utilized During Treatment: Gait belt Activity Tolerance: Patient tolerated treatment well Patient left: in bed;with call bell/phone within reach;with bed alarm set;with restraints reapplied Nurse Communication: Mobility status PT Visit Diagnosis: Other abnormalities of gait and mobility (R26.89);Muscle weakness (generalized) (M62.81)     Time: 8797-8765 PT Time Calculation (min) (ACUTE ONLY): 32 min  Charges:    $Gait Training: 8-22 mins $Therapeutic Activity: 8-22 mins PT General Charges $$ ACUTE PT VISIT: 1 Visit                     Micheline Kennedy, PT Acute Rehabilitation Services Office:[phone] 07/06/2024    Jordan Kennedy 07/06/2024, 2:30 PM

## 2024-07-06 NOTE — Progress Notes (Signed)
 Heart Failure Navigator Progress Note  Assessed for Heart & Vascular TOC clinic readiness.  Patient does not meet criteria due to EF <20%, admitted with sepsis shock from UTI, per MD note patient with intellectual disability, end stage heart failure, with poor prognosis. Patient has a scheduled [facility] appointment on 07/23/2024. No HF TOC. .   Navigator will sign off at this time.   Stephane Haddock, BSN, Scientist, clinical (histocompatibility and immunogenetics) Only

## 2024-07-07 DIAGNOSIS — A4150 Gram-negative sepsis, unspecified: Secondary | ICD-10-CM | POA: Diagnosis not present

## 2024-07-07 DIAGNOSIS — N390 Urinary tract infection, site not specified: Secondary | ICD-10-CM | POA: Diagnosis not present

## 2024-07-07 LAB — CBC WITH DIFFERENTIAL/PLATELET
Abs Immature Granulocytes: 0.09 K/uL — ABNORMAL HIGH (ref 0.00–0.07)
Basophils Absolute: 0.1 K/uL (ref 0.0–0.1)
Basophils Relative: 1 %
Eosinophils Absolute: 0.5 K/uL (ref 0.0–0.5)
Eosinophils Relative: 5 %
HCT: 35.3 % — ABNORMAL LOW (ref 39.0–52.0)
Hemoglobin: 11.4 g/dL — ABNORMAL LOW (ref 13.0–17.0)
Immature Granulocytes: 1 %
Lymphocytes Relative: 24 %
Lymphs Abs: 2.5 K/uL (ref 0.7–4.0)
MCH: 29.8 pg (ref 26.0–34.0)
MCHC: 32.3 g/dL (ref 30.0–36.0)
MCV: 92.2 fL (ref 80.0–100.0)
Monocytes Absolute: 1.4 K/uL — ABNORMAL HIGH (ref 0.1–1.0)
Monocytes Relative: 13 %
Neutro Abs: 6.1 K/uL (ref 1.7–7.7)
Neutrophils Relative %: 56 %
Platelets: 364 K/uL (ref 150–400)
RBC: 3.83 MIL/uL — ABNORMAL LOW (ref 4.22–5.81)
RDW: 17.5 % — ABNORMAL HIGH (ref 11.5–15.5)
WBC: 10.7 K/uL — ABNORMAL HIGH (ref 4.0–10.5)
nRBC: 0 % (ref 0.0–0.2)

## 2024-07-07 LAB — BASIC METABOLIC PANEL WITH GFR
Anion gap: 14 (ref 5–15)
BUN: 19 mg/dL (ref 8–23)
CO2: 20 mmol/L — ABNORMAL LOW (ref 22–32)
Calcium: 9 mg/dL (ref 8.9–10.3)
Chloride: 105 mmol/L (ref 98–111)
Creatinine, Ser: 0.99 mg/dL (ref 0.61–1.24)
GFR, Estimated: >60 mL/min (ref >60)
Glucose, Bld: 108 mg/dL — ABNORMAL HIGH (ref 70–99)
Potassium: 4.3 mmol/L (ref 3.5–5.1)
Sodium: 139 mmol/L (ref 135–145)

## 2024-07-07 NOTE — Progress Notes (Signed)
 PROGRESS NOTE  Jordan Kennedy FMW:[id number] DOB: 09/01/1956 DOA: 07/01/2024 PCP: [name] HERO, MD  HPI/Recap of past 24 hours: Jordan Kennedy is a 68 y.o. male with medical history significant of schizophrenia and intellectual disability, HFrEF (EF <20%), hx of BPH and bladder outlet obstruction w/ admission in 05/2024 for septic shock secondary to E. Coli and Enterobacter UTI and discharged 8/27 with an indwelling catheter, as well as recent admission on 9/5 for presumed colitis and discharged on midodrine , now p/w hypotension 2/2 sepsis of presumed urinary origin. Pt is a poor historian. PTA, was evaluated at OP cards office and noted to be hypotensive (60/40s) for which EMS was activated and pt BIBA to ED for further evaluation. In the ED, pt hypotensive 70/50s (MAP 60s), in the setting of low EF. Labs notable for  WBC 15.9 and Cr 1.3. UA pos for LE, but neg for nitrite. EDP consutled CCM who agreed with admission to ICU.  In the ICU, patient was started on Levophed , which was eventually titrated off with BP maintaining, although still soft.  Patient seems to be improving overall.  [facility] hospital[facility] resumed care on 07/03/2024.     Today, patient denies any new complaints.  Continues to ask for food/snacks frequently.    Assessment/Plan: Principal Problem:   Sepsis due to gram-negative urinary tract infection (HCC) Active Problems:   Hypotension due to hypovolemia   Sepsis (HCC)   ?Sepsis shock of presumed urinary origin ?CAUTI Hypotension-required pressors in ICU, weaned off Currently afebrile, with chronic leukocytosis UA with moderate leukocytes, WBC 21-50, none bacteria seen UCx no growth BC x 2 NGTD S/p IV Cefepime --> switch to Cipro  as per PCCM Continue midodrine  10mg  TID Monitor closely   HFrEF Noted persistent tachycardia Last echo showed EF of less than 20%, noted regional wall motion abnormality, left ventricular diastolic function could not be evaluated HOLD pta  Entresto  due to hypotension Continue midodrine    BPH PTA finasteride  and tamsulosin    Schizophrenia PTA Clozaril  and Paxil   Goals of care discussion Patient with poor prognosis, frequent admission, end-stage EF Attempted to call caregiver to discuss goals of care on 9/13     Estimated body mass index is 21.49 kg/m as calculated from the following:   Height as of this encounter: 6' 5 (1.956 m).   Weight as of this encounter: 82.2 kg.     Code Status: Full  Family Communication: Attempted to call legal guardian, no answer  Disposition Plan: Status is: Inpatient Remains inpatient appropriate because: level of care      Consultants: PCCM  Procedures: None  Antimicrobials: Cefepime -->Cipro   DVT prophylaxis:  Lovenox    Objective: Vitals:   07/07/24 0400 07/07/24 0458 07/07/24 0814 07/07/24 1242  BP: 95/74 (!) 110/90 109/74 107/80  Pulse: 95 (!) 101 97 (!) 109  Resp: 19 18    Temp: 98.3 F (36.8 C) 98.2 F (36.8 C) 98 F (36.7 C) 98 F (36.7 C)  TempSrc:      SpO2: 98% 100% 96% 100%  Weight:      Height:        Intake/Output Summary (Last 24 hours) at 07/07/2024 1620 Last data filed at 07/07/2024 0406 Gross per 24 hour  Intake --  Output 1200 ml  Net -1200 ml   Filed Weights   07/01/24 1010 07/03/24 0600  Weight: 78.5 kg 82.2 kg    Exam: General: NAD  Cardiovascular: S1, S2 present Respiratory: Diminished breath sounds bilaterally Abdomen: Soft, nontender, nondistended, bowel sounds present Musculoskeletal:  No bilateral pedal edema noted Skin: Normal Psychiatry: Stable mood     Data Reviewed: CBC: Recent Labs  Lab 07/03/24 1223 07/04/24 0517 07/05/24 0613 07/06/24 0433 07/07/24 0535  WBC 12.4* 13.2* 11.5* 12.6* 10.7*  NEUTROABS 7.4 7.5 7.1 7.5 6.1  HGB 11.6* 11.2* 11.6* 11.3* 11.4*  HCT 36.8* 35.7* 35.7* 35.1* 35.3*  MCV 94.8 94.2 93.2 91.2 92.2  PLT 404* 354 370 401* 364   Basic Metabolic Panel: Recent Labs  Lab  07/02/24 0057 07/03/24 1223 07/04/24 0517 07/05/24 0613 07/06/24 0433 07/07/24 0535  NA 139 141 141 139 140 139  K 4.1 4.5 5.4* 4.5 4.2 4.3  CL 107 111 108 108 105 105  CO2 23 21* 20* 21* 24 20*  GLUCOSE 111* 103* 109* 104* 110* 108*  BUN 12 9 11 10 16 19   CREATININE 1.17 0.94 0.95 1.06 1.11 0.99  CALCIUM  8.5* 9.0 8.7* 9.2 9.3 9.0  MG 2.1 1.8  --   --   --   --   PHOS  --  2.7  --   --   --   --    GFR: Estimated Creatinine Clearance: 86.5 mL/min (by C-G formula based on SCr of 0.99 mg/dL). Liver Function Tests: Recent Labs  Lab 07/01/24 1031  AST 24  ALT 21  ALKPHOS 86  BILITOT 0.6  PROT 6.2*  ALBUMIN  2.6*   No results for input(s): LIPASE, AMYLASE in the last 168 hours. No results for input(s): AMMONIA in the last 168 hours. Coagulation Profile: Recent Labs  Lab 07/01/24 1031  INR 1.2   Cardiac Enzymes: No results for input(s): CKTOTAL, CKMB, CKMBINDEX, TROPONINI in the last 168 hours. BNP (last 3 results) No results for input(s): PROBNP in the last 8760 hours. HbA1C: No results for input(s): HGBA1C in the last 72 hours. CBG: Recent Labs  Lab 07/03/24 1207 07/03/24 1541 07/03/24 2014 07/04/24 0834 07/05/24 2211  GLUCAP 118* 113* 116* 146* 100*   Lipid Profile: No results for input(s): CHOL, HDL, LDLCALC, TRIG, CHOLHDL, LDLDIRECT in the last 72 hours. Thyroid Function Tests: No results for input(s): TSH, T4TOTAL, FREET4, T3FREE, THYROIDAB in the last 72 hours. Anemia Panel: No results for input(s): VITAMINB12, FOLATE, FERRITIN, TIBC, IRON, RETICCTPCT in the last 72 hours. Urine analysis:    Component Value Date/Time   COLORURINE YELLOW 07/01/2024 1031   APPEARANCEUR HAZY (A) 07/01/2024 1031   LABSPEC 1.023 07/01/2024 1031   PHURINE 5.0 07/01/2024 1031   GLUCOSEU >=500 (A) 07/01/2024 1031   HGBUR LARGE (A) 07/01/2024 1031   BILIRUBINUR NEGATIVE 07/01/2024 1031   BILIRUBINUR negative 11/25/2023  1027   BILIRUBINUR Negative 01/31/2015 1010   KETONESUR NEGATIVE 07/01/2024 1031   PROTEINUR 30 (A) 07/01/2024 1031   UROBILINOGEN 1.0 11/25/2023 1027   UROBILINOGEN 1.0 06/12/2012 1315   NITRITE NEGATIVE 07/01/2024 1031   LEUKOCYTESUR MODERATE (A) 07/01/2024 1031   Sepsis Labs: @LABRCNTIP (procalcitonin:4,lacticidven:4)  ) Recent Results (from the past 240 hours)  Resp panel by RT-PCR (RSV, Flu A&B, Covid) Anterior Nasal Swab     Status: None   Collection Time: 07/01/24 10:31 AM   Specimen: Anterior Nasal Swab  Result Value Ref Range Status   SARS Coronavirus 2 by RT PCR NEGATIVE NEGATIVE Final   Influenza A by PCR NEGATIVE NEGATIVE Final   Influenza B by PCR NEGATIVE NEGATIVE Final    Comment: (NOTE) The Xpert Xpress SARS-CoV-2/FLU/RSV plus assay is intended as an aid in the diagnosis of influenza from Nasopharyngeal swab specimens and should not  be used as a sole basis for treatment. Nasal washings and aspirates are unacceptable for Xpert Xpress SARS-CoV-2/FLU/RSV testing.  Fact Sheet for Patients: BloggerCourse.com  Fact Sheet for Healthcare Providers: SeriousBroker.it  This test is not yet approved or cleared by the [place]  FDA and has been authorized for detection and/or diagnosis of SARS-CoV-2 by FDA under an Emergency Use Authorization (EUA). This EUA will remain in effect (meaning this test can be used) for the duration of the COVID-19 declaration under Section 564(b)(1) of the Act, 21 U.S.C. section 360bbb-3(b)(1), unless the authorization is terminated or revoked.     Resp Syncytial Virus by PCR NEGATIVE NEGATIVE Final    Comment: (NOTE) Fact Sheet for Patients: BloggerCourse.com  Fact Sheet for Healthcare Providers: SeriousBroker.it  This test is not yet approved or cleared by the [place]  FDA and has been authorized for detection and/or diagnosis  of SARS-CoV-2 by FDA under an Emergency Use Authorization (EUA). This EUA will remain in effect (meaning this test can be used) for the duration of the COVID-19 declaration under Section 564(b)(1) of the Act, 21 U.S.C. section 360bbb-3(b)(1), unless the authorization is terminated or revoked.  Performed at [facility], [address], [place], [place] [address]   Urine Culture     Status: None   Collection Time: 07/01/24 10:31 AM   Specimen: Urine, Random  Result Value Ref Range Status   Specimen Description URINE, RANDOM  Final   Special Requests NONE Reflexed from [id number]  Final   Culture   Final    NO GROWTH Performed at [facility], [address], [place], [place] [address]    Report Status 07/03/2024 FINAL  Final  Blood Culture (routine x 2)     Status: None   Collection Time: 07/01/24 10:49 AM   Specimen: BLOOD RIGHT HAND  Result Value Ref Range Status   Specimen Description BLOOD RIGHT HAND  Final   Special Requests   Final    BOTTLES DRAWN AEROBIC AND ANAEROBIC Blood Culture adequate volume   Culture   Final    NO GROWTH 5 DAYS Performed at [facility], [address], [place], [place] [address]    Report Status 07/06/2024 FINAL  Final  Blood Culture (routine x 2)     Status: None   Collection Time: 07/01/24 10:49 AM   Specimen: BLOOD LEFT FOREARM  Result Value Ref Range Status   Specimen Description BLOOD LEFT FOREARM  Final   Special Requests   Final    BOTTLES DRAWN AEROBIC AND ANAEROBIC Blood Culture adequate volume   Culture   Final    NO GROWTH 5 DAYS Performed at [facility], [address], [place], [place] [address]    Report Status 07/06/2024 FINAL  Final  MRSA Next Gen by PCR, Nasal     Status: None   Collection Time: 07/02/24 12:22 AM   Specimen: Nasal Mucosa; Nasal Swab  Result Value Ref Range Status   MRSA by PCR Next Gen NOT DETECTED NOT DETECTED Final    Comment: (NOTE) The GeneXpert MRSA Assay (FDA approved for  NASAL specimens only), is one component of a comprehensive MRSA colonization surveillance program. It is not intended to diagnose MRSA infection nor to guide or monitor treatment for MRSA infections. Test performance is not FDA approved in patients less than 44 years old. Performed at [facility], [address], [place], [place] [address]   Urine Culture (for pregnant, neutropenic  or urologic patients or patients with an indwelling urinary catheter)     Status: None   Collection Time: 07/02/24 11:19 AM   Specimen: Urine, Catheterized  Result Value Ref Range Status   Specimen Description URINE, CATHETERIZED  Final   Special Requests Normal  Final   Culture   Final    NO GROWTH Performed at [facility], [address], [place], [place] [address]    Report Status 07/03/2024 FINAL  Final      Studies: No results found.  Scheduled Meds:  amantadine   100 mg Oral QHS   aspirin  EC  81 mg Oral Daily   Chlorhexidine  Gluconate Cloth  6 each Topical Daily   cloZAPine   200 mg Oral BID   enoxaparin  (LOVENOX ) injection  40 mg Subcutaneous Q24H   finasteride   5 mg Oral Daily   linaclotide   290 mcg Oral QAC breakfast   melatonin  10 mg Oral QHS   midodrine   10 mg Oral TID WC   pantoprazole   40 mg Oral Daily   PARoxetine   40 mg Oral QPM   polyethylene glycol  17 g Oral BID   senna-docusate  1 tablet Oral BID   tamsulosin   0.4 mg Oral QPC supper    Continuous Infusions:   LOS: 6 days     Lebron JINNY Cage, MD [facility]  If 7PM-7AM, please contact night-coverage [email or web address] 07/07/2024, 4:20 PM

## 2024-07-07 NOTE — TOC Progression Note (Signed)
 Transition of Care [facility]) - Progression Note    Patient Details  Name: Jordan Kennedy MRN: [id number] Date of Birth: 1956/08/14  Transition of Care [facility]) CM/SW Contact  Sherline Clack, [place] Phone Number: 07/07/2024, 2:28 PM  Clinical Narrative:     CSW spoke with [name]/legal guardian about placement at [facility] after discharge. [name] told CSW she would get in contact with the facility and update CSW on what the facility says. CSW also called [facility] and the admissions person said they would consider the patient. CSW will continue to follow and will update discharge plan if necessary.   Expected Discharge Plan: Skilled Nursing Facility Barriers to Discharge: Continued Medical Work up, English as a second language teacher               Expected Discharge Plan and Services In-house Referral: Clinical Social Work   Post Acute Care Choice: Skilled Nursing Facility Living arrangements for the past 2 months: Group Home (Alternative Family Living)                                       Social Drivers of Health (SDOH) Interventions SDOH Screenings   Food Insecurity: Patient Unable To Answer (07/02/2024)  Housing: Patient Unable To Answer (07/02/2024)  Transportation Needs: Patient Unable To Answer (07/02/2024)  Utilities: Patient Unable To Answer (07/02/2024)  Alcohol Screen: Low Risk  (11/05/2023)  Depression (PHQ2-9): Low Risk  (11/05/2023)  Financial Resource Strain: Low Risk  (11/05/2023)  Physical Activity: Inactive (11/05/2023)  Social Connections: Patient Unable To Answer (07/02/2024)  Stress: No Stress Concern Present (11/05/2023)  Tobacco Use: Medium Risk (07/01/2024)  Health Literacy: Adequate Health Literacy (11/05/2023)    Readmission Risk Interventions    06/28/2024   11:41 AM 06/11/2024   10:27 AM  Readmission Risk Prevention Plan  Transportation Screening Complete Complete  PCP or Specialist Appt within 5-7 Days Complete   PCP or Specialist  Appt within 3-5 Days  Complete  Home Care Screening Complete   Medication Review (RN CM) Complete   HRI or Home Care Consult  Complete  Social Work Consult for Recovery Care Planning/Counseling  Complete  Palliative Care Screening  Not Applicable  Medication Review Oceanographer)  Complete

## 2024-07-07 NOTE — Progress Notes (Signed)
 MEWS Progress Note  Patient Details Name: Jordan Kennedy MRN: [id number] DOB: 11/08/1955 Today's Date: 07/07/2024   MEWS Flowsheet Documentation:  Assess: MEWS Score Temp: 98.1 F (36.7 C) BP: 95/68 MAP (mmHg): 77 Pulse Rate: 94 ECG Heart Rate: (!) 113 Resp: 16 Level of Consciousness: Alert SpO2: 100 % O2 Device: Room Air Patient Activity (if Appropriate): In bed Assess: MEWS Score MEWS Temp: 0 MEWS Systolic: 1 MEWS Pulse: 0 MEWS RR: 0 MEWS LOC: 0 MEWS Score: 1 MEWS Score Color: Green Assess: SIRS CRITERIA SIRS Temperature : 0 SIRS Respirations : 0 SIRS Pulse: 1 SIRS WBC: 0 SIRS Score Sum : 1 SIRS Temperature : 0 SIRS Pulse: 1 SIRS Respirations : 0 SIRS WBC: 0 SIRS Score Sum : 1 Assess: if the MEWS score is Yellow or Red Were vital signs accurate and taken at a resting state?: No, vital signs rechecked Does the patient meet 2 or more of the SIRS criteria?: No Does the patient have a confirmed or suspected source of infection?: No MEWS guidelines implemented : No, previously yellow, continue vital signs every 4 hours     Pt was up ambulating in the room MEWS flag was not accurate as patient was over exerting with known COPD exacerbation.    Jordan Kennedy 07/07/2024, 10:26 PM

## 2024-07-07 NOTE — Progress Notes (Signed)
 Occupational Therapy Treatment Patient Details Name: Jordan Kennedy MRN: [id number] DOB: June 16, 1956 Today's Date: 07/07/2024   History of present illness Pt is a 68 y.o. with admission in August for septic shock and UTI and discharged 8/27 with an indwelling catheter. Admitted  9/5 for constipation and possible colitis and discharged  home on 9/7.  Admitted 9/11 with hypotension, generalized weakness and abdominal pain.  Pt with sepsis and UTI. PMH: cognitive impairment, schizophrenia and intellectual disability , CHF, tardive dyskinesia, chronic constipation, anemia, hx of SBO, BPH.   OT comments  Patient demonstrating gains with bed mobility with CGA to get to EOB and for sitting balance. Patient able to stand at sink for self care tasks with occasional assist for balance due to posterior leaning.  Patient performed transfer training wit CGA and cues for safety.  Patient will benefit from continued inpatient follow up therapy, <3 hours/day.  Acute OT to continue to follow to address established goals to facilitate DC to next venue of care.        If plan is discharge home, recommend the following:  A little help with walking and/or transfers;A lot of help with bathing/dressing/bathroom;Direct supervision/assist for medications management;Direct supervision/assist for financial management;Assist for transportation;Help with stairs or ramp for entrance   Equipment Recommendations  None recommended by OT    Recommendations for Other Services      Precautions / Restrictions Precautions Precautions: Fall Recall of Precautions/Restrictions: Impaired Precaution/Restrictions Comments: foley Restrictions Weight Bearing Restrictions Per Provider Order: No       Mobility Bed Mobility Overal bed mobility: Needs Assistance Bed Mobility: Rolling, Sidelying to Sit Rolling: Supervision Sidelying to sit: Contact guard assist       General bed mobility comments: CGA with trunk     Transfers Overall transfer level: Needs assistance Equipment used: Rolling walker (2 wheels) Transfers: Sit to/from Stand Sit to Stand: Contact guard assist           General transfer comment: assistance with balance due to posterior bias     Balance Overall balance assessment: Needs assistance Sitting-balance support: Feet supported, Feet unsupported Sitting balance-Leahy Scale: Poor Sitting balance - Comments: CGA due to posterior bias Postural control: Posterior lean Standing balance support: Bilateral upper extremity supported Standing balance-Leahy Scale: Poor Standing balance comment: stood at sink for self care tasks with  occasional posterior leaning                           ADL either performed or assessed with clinical judgement   ADL Overall ADL's : Needs assistance/impaired     Grooming: Wash/dry hands;Wash/dry face;Contact guard assist;Cueing for sequencing;Standing Grooming Details (indicate cue type and reason): standing at sink Upper Body Bathing: Minimal assistance;Standing Upper Body Bathing Details (indicate cue type and reason): at sink         Lower Body Dressing: Moderate assistance;Sit to/from stand Lower Body Dressing Details (indicate cue type and reason): able to doff socks but required assistance to [employer] Transfer: Contact guard assist;Rolling walker (2 wheels) Toilet Transfer Details (indicate cue type and reason): simulated         Functional mobility during ADLs: Contact guard assist;Rolling walker (2 wheels) General ADL Comments: able to stand for 5+ minutes at sink for self care tasks    Extremity/Trunk Assessment              Vision       Perception     Praxis  Communication Communication Communication: No apparent difficulties   Cognition Arousal: Alert Behavior During Therapy: Impulsive Cognition: History of cognitive impairments, No family/caregiver present to determine baseline, Cognition  impaired           Executive functioning impairment (select all impairments): Sequencing, Organization, Problem solving                   Following commands: Impaired Following commands impaired: Follows one step commands inconsistently, Follows one step commands with increased time      Cueing   Cueing Techniques: Verbal cues, Gestural cues  Exercises      Shoulder Instructions       General Comments concerned over getting cereal for breakfast    Pertinent Vitals/ Pain       Pain Assessment Pain Assessment: No/denies pain  Home Living                                          Prior Functioning/Environment              Frequency  Min 2X/week        Progress Toward Goals  OT Goals(current goals can now be found in the care plan section)  Progress towards OT goals: Progressing toward goals  Acute Rehab OT Goals Patient Stated Goal: to get cereal for breakfast OT Goal Formulation: Patient unable to participate in goal setting Time For Goal Achievement: 07/17/24 Potential to Achieve Goals: Good ADL Goals Pt Will Perform Lower Body Bathing: sit to/from stand;with contact guard assist Pt Will Perform Lower Body Dressing: with min assist;sit to/from stand Pt Will Transfer to Toilet: with supervision;ambulating Additional ADL Goal #1: Complete standing ADL at sink x 5 min with S with VSS  Plan      Co-evaluation                 AM-PAC OT 6 Clicks Daily Activity     Outcome Measure   Help from another person eating meals?: A Little Help from another person taking care of personal grooming?: A Little Help from another person toileting, which includes using toliet, bedpan, or urinal?: A Lot Help from another person bathing (including washing, rinsing, drying)?: A Lot Help from another person to put on and taking off regular upper body clothing?: A Little Help from another person to put on and taking off regular lower body  clothing?: A Lot 6 Click Score: 15    End of Session Equipment Utilized During Treatment: Gait belt;Rolling walker (2 wheels)  OT Visit Diagnosis: Unsteadiness on feet (R26.81);Other abnormalities of gait and mobility (R26.89);Muscle weakness (generalized) (M62.81);Other symptoms and signs involving cognitive function   Activity Tolerance Patient tolerated treatment well   Patient Left in chair;with call bell/phone within reach;with chair alarm set   Nurse Communication Mobility status        Time: 9077-9045 OT Time Calculation (min): 32 min  Charges: OT General Charges $OT Visit: 1 Visit OT Treatments $Self Care/Home Management : 8-22 mins $Therapeutic Activity: 8-22 mins  Dick Laine, OTA Acute Rehabilitation Services  Office [phone]   Jeb LITTIE Laine 07/07/2024, 10:47 AM

## 2024-07-07 NOTE — Progress Notes (Signed)
 Mobility Specialist: Progress Note   07/07/24 1600  Mobility  Activity Pivoted/transferred from chair to bed  Level of Assistance Minimal assist, patient does 75% or more  Assistive Device Front wheel walker  Distance Ambulated (ft) 3 ft  Activity Response Tolerated well  Mobility Referral Yes  Mobility visit 1 Mobility  Mobility Specialist Start Time (ACUTE ONLY) 1340  Mobility Specialist Stop Time (ACUTE ONLY) 1350  Mobility Specialist Time Calculation (min) (ACUTE ONLY) 10 min    Pt received in chair. Heavy minA for STS from chair, minA for short bout of ambulation to chair. Mod cues needed for redirection. Left pt in bed with all needs met, call bell in reach. Bed alarm on.   Jordan Kennedy Mobility Specialist Please contact via SecureChat or Rehab office at [phone]

## 2024-07-08 DIAGNOSIS — A4150 Gram-negative sepsis, unspecified: Secondary | ICD-10-CM | POA: Diagnosis not present

## 2024-07-08 DIAGNOSIS — N390 Urinary tract infection, site not specified: Secondary | ICD-10-CM | POA: Diagnosis not present

## 2024-07-08 LAB — BASIC METABOLIC PANEL WITH GFR
Anion gap: 10 (ref 5–15)
BUN: 17 mg/dL (ref 8–23)
CO2: 23 mmol/L (ref 22–32)
Calcium: 8.9 mg/dL (ref 8.9–10.3)
Chloride: 107 mmol/L (ref 98–111)
Creatinine, Ser: 0.98 mg/dL (ref 0.61–1.24)
GFR, Estimated: >60 mL/min (ref >60)
Glucose, Bld: 103 mg/dL — ABNORMAL HIGH (ref 70–99)
Potassium: 4.4 mmol/L (ref 3.5–5.1)
Sodium: 140 mmol/L (ref 135–145)

## 2024-07-08 LAB — CBC WITH DIFFERENTIAL/PLATELET
Abs Immature Granulocytes: 0.08 K/uL — ABNORMAL HIGH (ref 0.00–0.07)
Basophils Absolute: 0.1 K/uL (ref 0.0–0.1)
Basophils Relative: 1 %
Eosinophils Absolute: 0.6 K/uL — ABNORMAL HIGH (ref 0.0–0.5)
Eosinophils Relative: 5 %
HCT: 34.8 % — ABNORMAL LOW (ref 39.0–52.0)
Hemoglobin: 11.2 g/dL — ABNORMAL LOW (ref 13.0–17.0)
Immature Granulocytes: 1 %
Lymphocytes Relative: 22 %
Lymphs Abs: 2.7 K/uL (ref 0.7–4.0)
MCH: 29.6 pg (ref 26.0–34.0)
MCHC: 32.2 g/dL (ref 30.0–36.0)
MCV: 92.1 fL (ref 80.0–100.0)
Monocytes Absolute: 1.6 K/uL — ABNORMAL HIGH (ref 0.1–1.0)
Monocytes Relative: 13 %
Neutro Abs: 7.5 K/uL (ref 1.7–7.7)
Neutrophils Relative %: 58 %
Platelets: 378 K/uL (ref 150–400)
RBC: 3.78 MIL/uL — ABNORMAL LOW (ref 4.22–5.81)
RDW: 17.2 % — ABNORMAL HIGH (ref 11.5–15.5)
WBC: 12.6 K/uL — ABNORMAL HIGH (ref 4.0–10.5)
nRBC: 0 % (ref 0.0–0.2)

## 2024-07-08 NOTE — Plan of Care (Signed)

## 2024-07-08 NOTE — Progress Notes (Signed)
 Physical Therapy Treatment Patient Details Name: Jordan Kennedy MRN: [id number] DOB: 1956-06-13 Today's Date: 07/08/2024   History of Present Illness Pt is a 68 y.o. with admission in August for septic shock and UTI and discharged 8/27 with an indwelling catheter. Admitted  9/5 for constipation and possible colitis and discharged  home on 9/7.  Admitted 9/11 with hypotension, generalized weakness and abdominal pain.  Pt with sepsis and UTI. PMH: cognitive impairment, schizophrenia and intellectual disability , CHF, tardive dyskinesia, chronic constipation, anemia, hx of SBO, BPH.    PT Comments  Patient initially mobilizing better though at times slowed down and demonstrated more posterior bias.  Patient staring forward but still responding and VSS.  Stated I am just enjoying my walk.  Seems to be improving with transitions.  PT will continue to follow during acute stay.     If plan is discharge home, recommend the following: A little help with walking and/or transfers;Help with stairs or ramp for entrance;A little help with bathing/dressing/bathroom;Assistance with cooking/housework   Can travel by private vehicle     Yes  Equipment Recommendations  None recommended by PT    Recommendations for Other Services       Precautions / Restrictions Precautions Precautions: Fall Recall of Precautions/Restrictions: Impaired Precaution/Restrictions Comments: foley     Mobility  Bed Mobility Overal bed mobility: Needs Assistance Bed Mobility: Supine to Sit     Supine to sit: Supervision, HOB elevated Sit to supine: Min assist   General bed mobility comments: assist for set up with foley to sit, assist for legs and positioning to supine    Transfers Overall transfer level: Needs assistance Equipment used: Rolling walker (2 wheels) Transfers: Sit to/from Stand Sit to Stand: Contact guard assist           General transfer comment: assist for balance     Ambulation/Gait Ambulation/Gait assistance: Min assist Gait Distance (Feet): 150 Feet Assistive device: Rolling walker (2 wheels) Gait Pattern/deviations: Step-to pattern, Step-through pattern, Decreased stride length, Shuffle       General Gait Details: at times with posterior bias though with A for pushing walker out improved anterior weight shift and gait speed, at times very slow and even paused though not true freezing episode pt stated, I am just enjoying my walk played gospel music during ambulation   Stairs             Wheelchair Mobility     Tilt Bed    Modified Rankin (Stroke Patients Only)       Balance Overall balance assessment: Needs assistance Sitting-balance support: Feet supported Sitting balance-Leahy Scale: Good     Standing balance support: Bilateral upper extremity supported Standing balance-Leahy Scale: Poor Standing balance comment: A and UE support static standing                            Communication Communication Communication: No apparent difficulties  Cognition Arousal: Alert Behavior During Therapy: Impulsive   PT - Cognitive impairments: History of cognitive impairments                         Following commands: Impaired Following commands impaired: Follows one step commands inconsistently, Follows one step commands with increased time    Cueing Cueing Techniques: Verbal cues, Gestural cues  Exercises      General Comments General comments (skin integrity, edema, etc.): asking for snacks and cokes thoughout; VSS  Pertinent Vitals/Pain Pain Assessment Pain Assessment: No/denies pain    Home Living                          Prior Function            PT Goals (current goals can now be found in the care plan section) Progress towards PT goals: Progressing toward goals    Frequency    Min 2X/week      PT Plan      Co-evaluation              AM-PAC PT 6  Clicks Mobility   Outcome Measure  Help needed turning from your back to your side while in a flat bed without using bedrails?: A Little Help needed moving from lying on your back to sitting on the side of a flat bed without using bedrails?: A Little Help needed moving to and from a bed to a chair (including a wheelchair)?: A Little Help needed standing up from a chair using your arms (e.g., wheelchair or bedside chair)?: A Little Help needed to walk in hospital room?: A Little Help needed climbing 3-5 steps with a railing? : Total 6 Click Score: 16    End of Session Equipment Utilized During Treatment: Gait belt Activity Tolerance: Patient tolerated treatment well Patient left: in bed;with call bell/phone within reach;with bed alarm set   PT Visit Diagnosis: Other abnormalities of gait and mobility (R26.89);Muscle weakness (generalized) (M62.81)     Time: 8964-8895 PT Time Calculation (min) (ACUTE ONLY): 29 min  Charges:    $Gait Training: 8-22 mins $Therapeutic Activity: 8-22 mins                       Jordan Kennedy, PT Acute Rehabilitation Services Office:[phone] 07/08/2024    Jordan Kennedy 07/08/2024, 5:21 PM

## 2024-07-08 NOTE — Progress Notes (Signed)
 Mobility Specialist: Progress Note   07/08/24 1500  Mobility  Activity Ambulated with assistance  Level of Assistance Contact guard assist, steadying assist  Assistive Device Front wheel walker  Activity Response Tolerated well  Mobility Referral Yes  Mobility visit 1 Mobility  Mobility Specialist Start Time (ACUTE ONLY) 1355  Mobility Specialist Stop Time (ACUTE ONLY) 1409  Mobility Specialist Time Calculation (min) (ACUTE ONLY) 14 min    Pt received in bed, pleasantly confused and agreeable to mobility session. Continuously asking for food throughout session. Sv for bed mobility. ModA for STS. MinG during ambulation for steadying. No complaints. Returned to room without fault. Left in bed with all needs met, call bell in reach. Bed alarm on.   Ileana Lute Mobility Specialist Please contact via SecureChat or Rehab office at [phone]

## 2024-07-08 NOTE — Progress Notes (Signed)
 PROGRESS NOTE  Jordan Kennedy FMW:[id number] DOB: [birth year] DOA: 07/01/2024 PCP: [name] HERO, MD  HPI/Recap of past 24 hours: Jordan Kennedy is a 68 y.o. male with medical history significant of schizophrenia and intellectual disability, HFrEF (EF <20%), hx of BPH and bladder outlet obstruction w/ admission in 05/2024 for septic shock secondary to E. Coli and Enterobacter UTI and discharged 8/27 with an indwelling catheter, as well as recent admission on 9/5 for presumed colitis and discharged on midodrine , now p/w hypotension 2/2 sepsis of presumed urinary origin. Pt is a poor historian. PTA, was evaluated at OP cards office and noted to be hypotensive (60/40s) for which EMS was activated and pt BIBA to ED for further evaluation. In the ED, pt hypotensive 70/50s (MAP 60s), in the setting of low EF. Labs notable for  WBC 15.9 and Cr 1.3. UA pos for LE, but neg for nitrite. EDP consutled CCM who agreed with admission to ICU.  In the ICU, patient was started on Levophed , which was eventually titrated off with BP maintaining, although still soft.  Patient seems to be improving overall.  [facility] hospital[facility] resumed care on 07/03/2024.     Today, patient denies any new complaints.  Asks for some sprite.  Denies any chest pain, shortness of breath.    Assessment/Plan: Principal Problem:   Sepsis due to gram-negative urinary tract infection (HCC) Active Problems:   Hypotension due to hypovolemia   Sepsis (HCC)   ?Sepsis shock of presumed urinary origin ?CAUTI Hypotension-required pressors in ICU, weaned off Currently afebrile, with chronic leukocytosis UA with moderate leukocytes, WBC 21-50, none bacteria seen UCx no growth BC x 2 NGTD Completed emperic antibiotic therapy  continue midodrine  10mg  TID Monitor closely   HFrEF Noted persistent tachycardia Last echo showed EF of less than 20%, noted regional wall motion abnormality, left ventricular diastolic function could not be  evaluated  Patient continues to have softer blood pressures.  Will continue to hold Entresto  Continue midodrine    BPH PTA finasteride  and tamsulosin    Schizophrenia PTA Clozaril  and Paxil   Goals of care discussion Patient with poor prognosis, frequent admission, end-stage EF Attempted to call caregiver to discuss goals of care on 9/13     Estimated body mass index is 21.49 kg/m as calculated from the following:   Height as of this encounter: 6' 5 (1.956 m).   Weight as of this encounter: 82.2 kg.     Code Status: Full  Disposition Plan: Skilled nursing facility placement. Status is: Inpatient Remains inpatient appropriate because: level of care      Consultants: PCCM  Procedures: None  Antimicrobials: Completed therapy DVT prophylaxis:  Lovenox    Objective: Vitals:   07/08/24 0022 07/08/24 0531 07/08/24 0756 07/08/24 1154  BP: 107/84 93/69 93/79  92/66  Pulse: (!) 104 96 82   Resp:   15 17  Temp: 98.2 F (36.8 C) 97.9 F (36.6 C) 98.4 F (36.9 C) 97.6 F (36.4 C)  TempSrc:      SpO2: 97% 100% 97% 100%  Weight:      Height:        Intake/Output Summary (Last 24 hours) at 07/08/2024 1422 Last data filed at 07/07/2024 1800 Gross per 24 hour  Intake 240 ml  Output --  Net 240 ml   Filed Weights   07/01/24 1010 07/03/24 0600  Weight: 78.5 kg 82.2 kg    Exam: General: NAD  Cardiovascular: S1, S2 present Respiratory: Diminished breath sounds bilaterally Abdomen: Soft, nontender, nondistended, bowel sounds  present Musculoskeletal: No bilateral pedal edema noted Skin: Normal Psychiatry: Stable mood     Data Reviewed: CBC: Recent Labs  Lab 07/04/24 0517 07/05/24 0613 07/06/24 0433 07/07/24 0535 07/08/24 0657  WBC 13.2* 11.5* 12.6* 10.7* 12.6*  NEUTROABS 7.5 7.1 7.5 6.1 7.5  HGB 11.2* 11.6* 11.3* 11.4* 11.2*  HCT 35.7* 35.7* 35.1* 35.3* 34.8*  MCV 94.2 93.2 91.2 92.2 92.1  PLT 354 370 401* 364 378   Basic Metabolic Panel: Recent  Labs  Lab 07/02/24 0057 07/03/24 1223 07/04/24 0517 07/05/24 0613 07/06/24 0433 07/07/24 0535 07/08/24 0657  NA 139 141 141 139 140 139 140  K 4.1 4.5 5.4* 4.5 4.2 4.3 4.4  CL 107 111 108 108 105 105 107  CO2 23 21* 20* 21* 24 20* 23  GLUCOSE 111* 103* 109* 104* 110* 108* 103*  BUN 12 9 11 10 16 19 17   CREATININE 1.17 0.94 0.95 1.06 1.11 0.99 0.98  CALCIUM  8.5* 9.0 8.7* 9.2 9.3 9.0 8.9  MG 2.1 1.8  --   --   --   --   --   PHOS  --  2.7  --   --   --   --   --    GFR: Estimated Creatinine Clearance: 87.4 mL/min (by C-G formula based on SCr of 0.98 mg/dL). Liver Function Tests: No results for input(s): AST, ALT, ALKPHOS, BILITOT, PROT, ALBUMIN  in the last 168 hours.  No results for input(s): LIPASE, AMYLASE in the last 168 hours. No results for input(s): AMMONIA in the last 168 hours. Coagulation Profile: No results for input(s): INR, PROTIME in the last 168 hours.  Cardiac Enzymes: No results for input(s): CKTOTAL, CKMB, CKMBINDEX, TROPONINI in the last 168 hours. BNP (last 3 results) No results for input(s): PROBNP in the last 8760 hours. HbA1C: No results for input(s): HGBA1C in the last 72 hours. CBG: Recent Labs  Lab 07/03/24 1207 07/03/24 1541 07/03/24 2014 07/04/24 0834 07/05/24 2211  GLUCAP 118* 113* 116* 146* 100*   Lipid Profile: No results for input(s): CHOL, HDL, LDLCALC, TRIG, CHOLHDL, LDLDIRECT in the last 72 hours. Thyroid Function Tests: No results for input(s): TSH, T4TOTAL, FREET4, T3FREE, THYROIDAB in the last 72 hours. Anemia Panel: No results for input(s): VITAMINB12, FOLATE, FERRITIN, TIBC, IRON, RETICCTPCT in the last 72 hours. Urine analysis:    Component Value Date/Time   COLORURINE YELLOW 07/01/2024 1031   APPEARANCEUR HAZY (A) 07/01/2024 1031   LABSPEC 1.023 07/01/2024 1031   PHURINE 5.0 07/01/2024 1031   GLUCOSEU >=500 (A) 07/01/2024 1031   HGBUR LARGE (A)  07/01/2024 1031   BILIRUBINUR NEGATIVE 07/01/2024 1031   BILIRUBINUR negative 11/25/2023 1027   BILIRUBINUR Negative 01/31/2015 1010   KETONESUR NEGATIVE 07/01/2024 1031   PROTEINUR 30 (A) 07/01/2024 1031   UROBILINOGEN 1.0 11/25/2023 1027   UROBILINOGEN 1.0 06/12/2012 1315   NITRITE NEGATIVE 07/01/2024 1031   LEUKOCYTESUR MODERATE (A) 07/01/2024 1031   Sepsis Labs: @LABRCNTIP (procalcitonin:4,lacticidven:4)  ) Recent Results (from the past 240 hours)  Resp panel by RT-PCR (RSV, Flu A&B, Covid) Anterior Nasal Swab     Status: None   Collection Time: 07/01/24 10:31 AM   Specimen: Anterior Nasal Swab  Result Value Ref Range Status   SARS Coronavirus 2 by RT PCR NEGATIVE NEGATIVE Final   Influenza A by PCR NEGATIVE NEGATIVE Final   Influenza B by PCR NEGATIVE NEGATIVE Final    Comment: (NOTE) The Xpert Xpress SARS-CoV-2/FLU/RSV plus assay is intended as an aid in the diagnosis  of influenza from Nasopharyngeal swab specimens and should not be used as a sole basis for treatment. Nasal washings and aspirates are unacceptable for Xpert Xpress SARS-CoV-2/FLU/RSV testing.  Fact Sheet for Patients: BloggerCourse.com  Fact Sheet for Healthcare Providers: SeriousBroker.it  This test is not yet approved or cleared by the [place]  FDA and has been authorized for detection and/or diagnosis of SARS-CoV-2 by FDA under an Emergency Use Authorization (EUA). This EUA will remain in effect (meaning this test can be used) for the duration of the COVID-19 declaration under Section 564(b)(1) of the Act, 21 U.S.C. section 360bbb-3(b)(1), unless the authorization is terminated or revoked.     Resp Syncytial Virus by PCR NEGATIVE NEGATIVE Final    Comment: (NOTE) Fact Sheet for Patients: BloggerCourse.com  Fact Sheet for Healthcare Providers: SeriousBroker.it  This test is not yet approved  or cleared by the [place]  FDA and has been authorized for detection and/or diagnosis of SARS-CoV-2 by FDA under an Emergency Use Authorization (EUA). This EUA will remain in effect (meaning this test can be used) for the duration of the COVID-19 declaration under Section 564(b)(1) of the Act, 21 U.S.C. section 360bbb-3(b)(1), unless the authorization is terminated or revoked.  Performed at [facility], [address] [place], [place] [address]   Urine Culture     Status: None   Collection Time: 07/01/24 10:31 AM   Specimen: Urine, Random  Result Value Ref Range Status   Specimen Description URINE, RANDOM  Final   Special Requests NONE Reflexed from [id number]  Final   Culture   Final    NO GROWTH Performed at [facility], [address], [place], [place] [address]    Report Status 07/03/2024 FINAL  Final  Blood Culture (routine x 2)     Status: None   Collection Time: 07/01/24 10:49 AM   Specimen: BLOOD RIGHT HAND  Result Value Ref Range Status   Specimen Description BLOOD RIGHT HAND  Final   Special Requests   Final    BOTTLES DRAWN AEROBIC AND ANAEROBIC Blood Culture adequate volume   Culture   Final    NO GROWTH 5 DAYS Performed at [facility], [address], [place], [place] [address]    Report Status 07/06/2024 FINAL  Final  Blood Culture (routine x 2)     Status: None   Collection Time: 07/01/24 10:49 AM   Specimen: BLOOD LEFT FOREARM  Result Value Ref Range Status   Specimen Description BLOOD LEFT FOREARM  Final   Special Requests   Final    BOTTLES DRAWN AEROBIC AND ANAEROBIC Blood Culture adequate volume   Culture   Final    NO GROWTH 5 DAYS Performed at [facility], [address], [place], [place] [address]    Report Status 07/06/2024 FINAL  Final  MRSA Next Gen by PCR, Nasal     Status: None   Collection Time: 07/02/24 12:22 AM   Specimen: Nasal Mucosa; Nasal Swab  Result Value Ref Range Status   MRSA by PCR Next Gen NOT  DETECTED NOT DETECTED Final    Comment: (NOTE) The GeneXpert MRSA Assay (FDA approved for NASAL specimens only), is one component of a comprehensive MRSA colonization surveillance program. It is not intended to diagnose MRSA infection nor to guide or monitor treatment for MRSA infections. Test performance is not FDA approved in patients less than 19 years old. Performed at [facility], [address], [place],  [place] [address]   Urine Culture (for pregnant, neutropenic or urologic patients or patients with an indwelling urinary catheter)     Status: None   Collection Time: 07/02/24 11:19 AM   Specimen: Urine, Catheterized  Result Value Ref Range Status   Specimen Description URINE, CATHETERIZED  Final   Special Requests Normal  Final   Culture   Final    NO GROWTH Performed at [facility], [address], [place], [place] [address]    Report Status 07/03/2024 FINAL  Final      Studies: No results found.  Scheduled Meds:  amantadine   100 mg Oral QHS   aspirin  EC  81 mg Oral Daily   Chlorhexidine  Gluconate Cloth  6 each Topical Daily   cloZAPine   200 mg Oral BID   enoxaparin  (LOVENOX ) injection  40 mg Subcutaneous Q24H   finasteride   5 mg Oral Daily   linaclotide   290 mcg Oral QAC breakfast   melatonin  10 mg Oral QHS   midodrine   10 mg Oral TID WC   pantoprazole   40 mg Oral Daily   PARoxetine   40 mg Oral QPM   polyethylene glycol  17 g Oral BID   senna-docusate  1 tablet Oral BID   tamsulosin   0.4 mg Oral QPC supper    Continuous Infusions:   LOS: 7 days     Derryl Duval, MD [facility]  If 7PM-7AM, please contact night-coverage [email or web address] 07/08/2024, 2:22 PM

## 2024-07-09 DIAGNOSIS — N390 Urinary tract infection, site not specified: Secondary | ICD-10-CM | POA: Diagnosis not present

## 2024-07-09 DIAGNOSIS — A4150 Gram-negative sepsis, unspecified: Secondary | ICD-10-CM | POA: Diagnosis not present

## 2024-07-09 NOTE — Plan of Care (Signed)
  Problem: Education: Goal: Knowledge of General Education information will improve Description: Including pain rating scale, medication(s)/side effects and non-pharmacologic comfort measures Outcome: Progressing   Problem: Clinical Measurements: Goal: Respiratory complications will improve Outcome: Progressing   Problem: Clinical Measurements: Goal: Cardiovascular complication will be avoided Outcome: Progressing   Problem: Activity: Goal: Risk for activity intolerance will decrease Outcome: Progressing   Problem: Nutrition: Goal: Adequate nutrition will be maintained Outcome: Progressing   Problem: Pain Managment: Goal: General experience of comfort will improve and/or be controlled Outcome: Progressing   Problem: Safety: Goal: Ability to remain free from injury will improve Outcome: Progressing   Problem: Skin Integrity: Goal: Risk for impaired skin integrity will decrease Outcome: Progressing

## 2024-07-09 NOTE — Plan of Care (Signed)

## 2024-07-09 NOTE — Progress Notes (Signed)
 PROGRESS NOTE  Jordan Kennedy FMW:[id number] DOB: 05-Jan-1956 DOA: 07/01/2024 PCP: [name] HERO, MD  HPI/Recap of past 24 hours: Jordan Kennedy is a 68 y.o. male with medical history significant of schizophrenia and intellectual disability, HFrEF (EF <20%), hx of BPH and bladder outlet obstruction w/ admission in 05/2024 for septic shock secondary to E. Coli and Enterobacter UTI and discharged 8/27 with an indwelling catheter, as well as recent admission on 9/5 for presumed colitis and discharged on midodrine , now p/w hypotension 2/2 sepsis of presumed urinary origin. Pt is a poor historian. PTA, was evaluated at OP cards office and noted to be hypotensive (60/40s) for which EMS was activated and pt BIBA to ED for further evaluation. In the ED, pt hypotensive 70/50s (MAP 60s), in the setting of low EF. Labs notable for  WBC 15.9 and Cr 1.3. UA pos for LE, but neg for nitrite. EDP consutled CCM who agreed with admission to ICU.  In the ICU, patient was started on Levophed , which was eventually titrated off with BP maintaining, although still soft.  Patient seems to be improving overall.  [facility] resumed care on 07/03/2024.     Today, patient denies any new complaints.  Asks for some sprite.  Denies any chest pain, shortness of breath.    Assessment/Plan: Principal Problem:   Sepsis due to gram-negative urinary tract infection (HCC) Active Problems:   Hypotension due to hypovolemia   Sepsis (HCC)   ?Sepsis shock of presumed urinary origin ?CAUTI Hypotension-required pressors in ICU, weaned off Currently afebrile, with chronic leukocytosis UA with moderate leukocytes, WBC 21-50, none bacteria seen UCx no growth BC x 2 NGTD Completed emperic antibiotic therapy  continue midodrine  10mg  TID Monitor closely   HFrEF Noted persistent tachycardia Last echo showed EF of less than 20%, noted regional wall motion abnormality, left ventricular diastolic function could not be  evaluated  Patient continues to have softer blood pressures.  Will continue to hold Entresto  Continue midodrine    BPH PTA finasteride  and tamsulosin    Schizophrenia PTA Clozaril  and Paxil   Goals of care discussion Will consult palliative care to discuss goals of care given end-stage CHF, frequent UTIs, frequent hospitalizations  Estimated body mass index is 21.49 kg/m as calculated from the following:   Height as of this encounter: 6' 5 (1.956 m).   Weight as of this encounter: 82.2 kg.     Code Status: Full  Disposition Plan: Skilled nursing facility placement. Status is: Inpatient Remains inpatient appropriate because: level of care      Consultants: PCCM  Procedures: None  Antimicrobials: Completed therapy DVT prophylaxis:  Lovenox    Objective: Vitals:   07/08/24 2128 07/09/24 0602 07/09/24 0739 07/09/24 1212  BP: 105/79 (!) 141/116 90/67 102/81  Pulse: (!) 107 100    Resp:   17 19  Temp: 97.8 F (36.6 C) 98.2 F (36.8 C) 98.2 F (36.8 C) 97.6 F (36.4 C)  TempSrc:  Oral    SpO2: 100% 100% 98% 97%  Weight:      Height:        Intake/Output Summary (Last 24 hours) at 07/09/2024 1347 Last data filed at 07/09/2024 1327 Gross per 24 hour  Intake 720 ml  Output 900 ml  Net -180 ml   Filed Weights   07/01/24 1010 07/03/24 0600  Weight: 78.5 kg 82.2 kg    Exam: General: NAD  Cardiovascular: S1, S2 present Respiratory: Diminished breath sounds bilaterally Abdomen: Soft, nontender, nondistended, bowel sounds present Musculoskeletal: No bilateral  pedal edema noted Skin: Normal Psychiatry: Stable mood     Data Reviewed: CBC: Recent Labs  Lab 07/04/24 0517 07/05/24 0613 07/06/24 0433 07/07/24 0535 07/08/24 0657  WBC 13.2* 11.5* 12.6* 10.7* 12.6*  NEUTROABS 7.5 7.1 7.5 6.1 7.5  HGB 11.2* 11.6* 11.3* 11.4* 11.2*  HCT 35.7* 35.7* 35.1* 35.3* 34.8*  MCV 94.2 93.2 91.2 92.2 92.1  PLT 354 370 401* 364 378   Basic Metabolic  Panel: Recent Labs  Lab 07/03/24 1223 07/04/24 0517 07/05/24 0613 07/06/24 0433 07/07/24 0535 07/08/24 0657  NA 141 141 139 140 139 140  K 4.5 5.4* 4.5 4.2 4.3 4.4  CL 111 108 108 105 105 107  CO2 21* 20* 21* 24 20* 23  GLUCOSE 103* 109* 104* 110* 108* 103*  BUN 9 11 10 16 19 17   CREATININE 0.94 0.95 1.06 1.11 0.99 0.98  CALCIUM  9.0 8.7* 9.2 9.3 9.0 8.9  MG 1.8  --   --   --   --   --   PHOS 2.7  --   --   --   --   --    GFR: Estimated Creatinine Clearance: 87.4 mL/min (by C-G formula based on SCr of 0.98 mg/dL). Liver Function Tests: No results for input(s): AST, ALT, ALKPHOS, BILITOT, PROT, ALBUMIN  in the last 168 hours.  No results for input(s): LIPASE, AMYLASE in the last 168 hours. No results for input(s): AMMONIA in the last 168 hours. Coagulation Profile: No results for input(s): INR, PROTIME in the last 168 hours.  Cardiac Enzymes: No results for input(s): CKTOTAL, CKMB, CKMBINDEX, TROPONINI in the last 168 hours. BNP (last 3 results) No results for input(s): PROBNP in the last 8760 hours. HbA1C: No results for input(s): HGBA1C in the last 72 hours. CBG: Recent Labs  Lab 07/03/24 1207 07/03/24 1541 07/03/24 2014 07/04/24 0834 07/05/24 2211  GLUCAP 118* 113* 116* 146* 100*   Lipid Profile: No results for input(s): CHOL, HDL, LDLCALC, TRIG, CHOLHDL, LDLDIRECT in the last 72 hours. Thyroid Function Tests: No results for input(s): TSH, T4TOTAL, FREET4, T3FREE, THYROIDAB in the last 72 hours. Anemia Panel: No results for input(s): VITAMINB12, FOLATE, FERRITIN, TIBC, IRON, RETICCTPCT in the last 72 hours. Urine analysis:    Component Value Date/Time   COLORURINE YELLOW 07/01/2024 1031   APPEARANCEUR HAZY (A) 07/01/2024 1031   LABSPEC 1.023 07/01/2024 1031   PHURINE 5.0 07/01/2024 1031   GLUCOSEU >=500 (A) 07/01/2024 1031   HGBUR LARGE (A) 07/01/2024 1031   BILIRUBINUR NEGATIVE  07/01/2024 1031   BILIRUBINUR negative 11/25/2023 1027   BILIRUBINUR Negative 01/31/2015 1010   KETONESUR NEGATIVE 07/01/2024 1031   PROTEINUR 30 (A) 07/01/2024 1031   UROBILINOGEN 1.0 11/25/2023 1027   UROBILINOGEN 1.0 06/12/2012 1315   NITRITE NEGATIVE 07/01/2024 1031   LEUKOCYTESUR MODERATE (A) 07/01/2024 1031   Sepsis Labs: @LABRCNTIP (procalcitonin:4,lacticidven:4)  ) Recent Results (from the past 240 hours)  Resp panel by RT-PCR (RSV, Flu A&B, Covid) Anterior Nasal Swab     Status: None   Collection Time: 07/01/24 10:31 AM   Specimen: Anterior Nasal Swab  Result Value Ref Range Status   SARS Coronavirus 2 by RT PCR NEGATIVE NEGATIVE Final   Influenza A by PCR NEGATIVE NEGATIVE Final   Influenza B by PCR NEGATIVE NEGATIVE Final    Comment: (NOTE) The Xpert Xpress SARS-CoV-2/FLU/RSV plus assay is intended as an aid in the diagnosis of influenza from Nasopharyngeal swab specimens and should not be used as a sole basis for treatment. Nasal  washings and aspirates are unacceptable for Xpert Xpress SARS-CoV-2/FLU/RSV testing.  Fact Sheet for Patients: BloggerCourse.com  Fact Sheet for Healthcare Providers: SeriousBroker.it  This test is not yet approved or cleared by the [place]  FDA and has been authorized for detection and/or diagnosis of SARS-CoV-2 by FDA under an Emergency Use Authorization (EUA). This EUA will remain in effect (meaning this test can be used) for the duration of the COVID-19 declaration under Section 564(b)(1) of the Act, 21 U.S.C. section 360bbb-3(b)(1), unless the authorization is terminated or revoked.     Resp Syncytial Virus by PCR NEGATIVE NEGATIVE Final    Comment: (NOTE) Fact Sheet for Patients: BloggerCourse.com  Fact Sheet for Healthcare Providers: SeriousBroker.it  This test is not yet approved or cleared by the [place]  FDA  and has been authorized for detection and/or diagnosis of SARS-CoV-2 by FDA under an Emergency Use Authorization (EUA). This EUA will remain in effect (meaning this test can be used) for the duration of the COVID-19 declaration under Section 564(b)(1) of the Act, 21 U.S.C. section 360bbb-3(b)(1), unless the authorization is terminated or revoked.  Performed at [facility], [address] [place], [place] [address]   Urine Culture     Status: None   Collection Time: 07/01/24 10:31 AM   Specimen: Urine, Random  Result Value Ref Range Status   Specimen Description URINE, RANDOM  Final   Special Requests NONE Reflexed from [id number]  Final   Culture   Final    NO GROWTH Performed at [facility], [address] [place], [place] [address]    Report Status 07/03/2024 FINAL  Final  Blood Culture (routine x 2)     Status: None   Collection Time: 07/01/24 10:49 AM   Specimen: BLOOD RIGHT HAND  Result Value Ref Range Status   Specimen Description BLOOD RIGHT HAND  Final   Special Requests   Final    BOTTLES DRAWN AEROBIC AND ANAEROBIC Blood Culture adequate volume   Culture   Final    NO GROWTH 5 DAYS Performed at [facility], [address], [place], [place] [address]    Report Status 07/06/2024 FINAL  Final  Blood Culture (routine x 2)     Status: None   Collection Time: 07/01/24 10:49 AM   Specimen: BLOOD LEFT FOREARM  Result Value Ref Range Status   Specimen Description BLOOD LEFT FOREARM  Final   Special Requests   Final    BOTTLES DRAWN AEROBIC AND ANAEROBIC Blood Culture adequate volume   Culture   Final    NO GROWTH 5 DAYS Performed at [facility], [address] [place], [place] [address]    Report Status 07/06/2024 FINAL  Final  MRSA Next Gen by PCR, Nasal     Status: None   Collection Time: 07/02/24 12:22 AM   Specimen: Nasal Mucosa; Nasal Swab  Result Value Ref Range Status   MRSA by PCR Next Gen NOT DETECTED NOT DETECTED Final     Comment: (NOTE) The GeneXpert MRSA Assay (FDA approved for NASAL specimens only), is one component of a comprehensive MRSA colonization surveillance program. It is not intended to diagnose MRSA infection nor to guide or monitor treatment for MRSA infections. Test performance is not FDA approved in patients less than 19 years old. Performed at [facility], [address] [place], [place] [address]   Urine Culture (for pregnant, neutropenic or urologic patients or patients with an indwelling urinary  catheter)     Status: None   Collection Time: 07/02/24 11:19 AM   Specimen: Urine, Catheterized  Result Value Ref Range Status   Specimen Description URINE, CATHETERIZED  Final   Special Requests Normal  Final   Culture   Final    NO GROWTH Performed at [facility], [address] [place], [place] [address]    Report Status 07/03/2024 FINAL  Final      Studies: No results found.  Scheduled Meds:  amantadine   100 mg Oral QHS   Chlorhexidine  Gluconate Cloth  6 each Topical Daily   cloZAPine   200 mg Oral BID   finasteride   5 mg Oral Daily   linaclotide   290 mcg Oral QAC breakfast   melatonin  10 mg Oral QHS   midodrine   10 mg Oral TID WC   pantoprazole   40 mg Oral Daily   PARoxetine   40 mg Oral QPM   polyethylene glycol  17 g Oral BID   senna-docusate  1 tablet Oral BID   tamsulosin   0.4 mg Oral QPC supper    Continuous Infusions:   LOS: 8 days     Derryl Duval, MD [facility]  If 7PM-7AM, please contact night-coverage [email or web address] 07/09/2024, 1:47 PM

## 2024-07-10 DIAGNOSIS — I959 Hypotension, unspecified: Secondary | ICD-10-CM | POA: Diagnosis not present

## 2024-07-10 DIAGNOSIS — A4150 Gram-negative sepsis, unspecified: Secondary | ICD-10-CM | POA: Diagnosis not present

## 2024-07-10 DIAGNOSIS — Z7189 Other specified counseling: Secondary | ICD-10-CM

## 2024-07-10 DIAGNOSIS — Z515 Encounter for palliative care: Secondary | ICD-10-CM

## 2024-07-10 DIAGNOSIS — N390 Urinary tract infection, site not specified: Secondary | ICD-10-CM | POA: Diagnosis not present

## 2024-07-10 NOTE — TOC Progression Note (Signed)
 Transition of Care [facility]) - Progression Note    Patient Details  Name: Jordan Kennedy MRN: [id number] Date of Birth: [birth year]  [facility] CM/SW Contact  Sherline Clack, [place] Phone Number: 07/10/2024, 4:10 PM  Clinical Narrative:     Via phone call, patient's legal guardian was presented with list of facilities who had accepted patient. Legal guardian/[name] [facility] for SNF. [facility] notified and accepted in the hub. Palliative will see patient before he is ready to dc. CSW will continue to follow.   Expected Discharge Plan: Skilled Nursing Facility Barriers to Discharge: Continued Medical Work up, English as a second language teacher               Expected Discharge Plan and Services In-house Referral: Clinical Social Work   Post Acute Care Choice: Skilled Nursing Facility Living arrangements for the past 2 months: Group Home (Alternative Family Living)                                       Social Drivers of Health (SDOH) Interventions SDOH Screenings   Food Insecurity: Patient Unable To Answer (07/02/2024)  Housing: Patient Unable To Answer (07/02/2024)  Transportation Needs: Patient Unable To Answer (07/02/2024)  Utilities: Patient Unable To Answer (07/02/2024)  Alcohol Screen: Low Risk  (11/05/2023)  Depression (PHQ2-9): Low Risk  (11/05/2023)  Financial Resource Strain: Low Risk  (11/05/2023)  Physical Activity: Inactive (11/05/2023)  Social Connections: Patient Unable To Answer (07/02/2024)  Stress: No Stress Concern Present (11/05/2023)  Tobacco Use: Medium Risk (07/01/2024)  Health Literacy: Adequate Health Literacy (11/05/2023)    Readmission Risk Interventions    06/28/2024   11:41 AM 06/11/2024   10:27 AM  Readmission Risk Prevention Plan  Transportation Screening Complete Complete  PCP or Specialist Appt within 5-7 Days Complete   PCP or Specialist Appt within 3-5 Days  Complete  Home Care Screening Complete   Medication  Review (RN CM) Complete   HRI or Home Care Consult  Complete  Social Work Consult for Recovery Care Planning/Counseling  Complete  Palliative Care Screening  Not Applicable  Medication Review Oceanographer)  Complete

## 2024-07-10 NOTE — Progress Notes (Signed)
 PROGRESS NOTE  [name] FMW:[id number] DOB: 1956/03/05 DOA: 07/01/2024 PCP: [name] HERO, MD  HPI/Recap of past 24 hours: Jordan Kennedy is a 68 y.o. male with medical history significant of schizophrenia and intellectual disability, HFrEF (EF <20%), hx of BPH and bladder outlet obstruction w/ admission in 05/2024 for septic shock secondary to E. Coli and Enterobacter UTI and discharged 8/27 with an indwelling catheter, as well as recent admission on 9/5 for presumed colitis and discharged on midodrine , now p/w hypotension 2/2 sepsis of presumed urinary origin. Pt is a poor historian. PTA, was evaluated at OP cards office and noted to be hypotensive (60/40s) for which EMS was activated and pt BIBA to ED for further evaluation. In the ED, pt hypotensive 70/50s (MAP 60s), in the setting of low EF. Labs notable for  WBC 15.9 and Cr 1.3. UA pos for LE, but neg for nitrite. EDP consutled CCM who agreed with admission to ICU.  In the ICU, patient was started on Levophed , which was eventually titrated off with BP maintaining, although still soft.  Patient seems to be improving overall.  [facility] resumed care on 07/03/2024.     Today, patient denies any new complaints. Denies any chest pain, shortness of breath. Foley is draining concentrated urine    Assessment/Plan: Principal Problem:   Sepsis due to gram-negative urinary tract infection (HCC) Active Problems:   Hypotension due to hypovolemia   Sepsis (HCC)   ?Sepsis shock of presumed urinary origin ?CAUTI Hypotension-required pressors in ICU, weaned off Currently afebrile, with chronic leukocytosis UA with moderate leukocytes, WBC 21-50, none bacteria seen UCx no growth BC x 2 NGTD Completed emperic antibiotic therapy  continue midodrine  10mg  TID Monitor closely   HFrEF Noted persistent tachycardia Last echo showed EF of less than 20%, noted regional wall motion abnormality, left ventricular diastolic function could  not be evaluated  Patient continues to have softer blood pressures.  Will continue to hold Entresto  Continue midodrine    BPH PTA finasteride  and tamsulosin    Schizophrenia PTA Clozaril  and Paxil   Goals of care discussion  palliative care on board to discuss goals of care given end-stage CHF, frequent UTIs, frequent hospitalizations  Estimated body mass index is 21.49 kg/m as calculated from the following:   Height as of this encounter: 6' 5 (1.956 m).   Weight as of this encounter: 82.2 kg.     Code Status: Full  Disposition Plan: Skilled nursing facility placement. Status is: Inpatient Remains inpatient appropriate because: level of care      Consultants: PCCM  Procedures: None  Antimicrobials: Completed therapy DVT prophylaxis:  Lovenox    Objective: Vitals:   07/10/24 0139 07/10/24 0520 07/10/24 0820 07/10/24 1157  BP: 96/78 106/84 112/84 (!) 108/94  Pulse: 98 99 (!) 101 88  Resp:      Temp: 98.6 F (37 C) 98.3 F (36.8 C) 98.2 F (36.8 C) 98.5 F (36.9 C)  TempSrc:      SpO2: 97% 96% 97% 100%  Weight:      Height:        Intake/Output Summary (Last 24 hours) at 07/10/2024 1403 Last data filed at 07/10/2024 0800 Gross per 24 hour  Intake 118 ml  Output 2725 ml  Net -2607 ml   Filed Weights   07/01/24 1010 07/03/24 0600  Weight: 78.5 kg 82.2 kg    Exam: General: NAD  Cardiovascular: S1, S2 present Respiratory: Diminished breath sounds bilaterally Abdomen: Soft, nontender, nondistended, bowel sounds present Musculoskeletal: No bilateral  pedal edema noted Skin: Normal Psychiatry: Stable mood     Data Reviewed: CBC: Recent Labs  Lab 07/04/24 0517 07/05/24 0613 07/06/24 0433 07/07/24 0535 07/08/24 0657  WBC 13.2* 11.5* 12.6* 10.7* 12.6*  NEUTROABS 7.5 7.1 7.5 6.1 7.5  HGB 11.2* 11.6* 11.3* 11.4* 11.2*  HCT 35.7* 35.7* 35.1* 35.3* 34.8*  MCV 94.2 93.2 91.2 92.2 92.1  PLT 354 370 401* 364 378   Basic Metabolic Panel: Recent  Labs  Lab 07/04/24 0517 07/05/24 0613 07/06/24 0433 07/07/24 0535 07/08/24 0657  NA 141 139 140 139 140  K 5.4* 4.5 4.2 4.3 4.4  CL 108 108 105 105 107  CO2 20* 21* 24 20* 23  GLUCOSE 109* 104* 110* 108* 103*  BUN 11 10 16 19 17   CREATININE 0.95 1.06 1.11 0.99 0.98  CALCIUM  8.7* 9.2 9.3 9.0 8.9   GFR: Estimated Creatinine Clearance: 87.4 mL/min (by C-G formula based on SCr of 0.98 mg/dL). Liver Function Tests: No results for input(s): AST, ALT, ALKPHOS, BILITOT, PROT, ALBUMIN  in the last 168 hours.  No results for input(s): LIPASE, AMYLASE in the last 168 hours. No results for input(s): AMMONIA in the last 168 hours. Coagulation Profile: No results for input(s): INR, PROTIME in the last 168 hours.  Cardiac Enzymes: No results for input(s): CKTOTAL, CKMB, CKMBINDEX, TROPONINI in the last 168 hours. BNP (last 3 results) No results for input(s): PROBNP in the last 8760 hours. HbA1C: No results for input(s): HGBA1C in the last 72 hours. CBG: Recent Labs  Lab 07/03/24 1541 07/03/24 2014 07/04/24 0834 07/05/24 2211  GLUCAP 113* 116* 146* 100*   Lipid Profile: No results for input(s): CHOL, HDL, LDLCALC, TRIG, CHOLHDL, LDLDIRECT in the last 72 hours. Thyroid Function Tests: No results for input(s): TSH, T4TOTAL, FREET4, T3FREE, THYROIDAB in the last 72 hours. Anemia Panel: No results for input(s): VITAMINB12, FOLATE, FERRITIN, TIBC, IRON, RETICCTPCT in the last 72 hours. Urine analysis:    Component Value Date/Time   COLORURINE YELLOW 07/01/2024 1031   APPEARANCEUR HAZY (A) 07/01/2024 1031   LABSPEC 1.023 07/01/2024 1031   PHURINE 5.0 07/01/2024 1031   GLUCOSEU >=500 (A) 07/01/2024 1031   HGBUR LARGE (A) 07/01/2024 1031   BILIRUBINUR NEGATIVE 07/01/2024 1031   BILIRUBINUR negative 11/25/2023 1027   BILIRUBINUR Negative 01/31/2015 1010   KETONESUR NEGATIVE 07/01/2024 1031   PROTEINUR 30 (A)  07/01/2024 1031   UROBILINOGEN 1.0 11/25/2023 1027   UROBILINOGEN 1.0 06/12/2012 1315   NITRITE NEGATIVE 07/01/2024 1031   LEUKOCYTESUR MODERATE (A) 07/01/2024 1031   Sepsis Labs: @LABRCNTIP (procalcitonin:4,lacticidven:4)  ) Recent Results (from the past 240 hours)  Resp panel by RT-PCR (RSV, Flu A&B, Covid) Anterior Nasal Swab     Status: None   Collection Time: 07/01/24 10:31 AM   Specimen: Anterior Nasal Swab  Result Value Ref Range Status   SARS Coronavirus 2 by RT PCR NEGATIVE NEGATIVE Final   Influenza A by PCR NEGATIVE NEGATIVE Final   Influenza B by PCR NEGATIVE NEGATIVE Final    Comment: (NOTE) The Xpert Xpress SARS-CoV-2/FLU/RSV plus assay is intended as an aid in the diagnosis of influenza from Nasopharyngeal swab specimens and should not be used as a sole basis for treatment. Nasal washings and aspirates are unacceptable for Xpert Xpress SARS-CoV-2/FLU/RSV testing.  Fact Sheet for Patients: BloggerCourse.com  Fact Sheet for Healthcare Providers: SeriousBroker.it  This test is not yet approved or cleared by the [place]  FDA and has been authorized for detection and/or diagnosis of SARS-CoV-2 by FDA  under an Emergency Use Authorization (EUA). This EUA will remain in effect (meaning this test can be used) for the duration of the COVID-19 declaration under Section 564(b)(1) of the Act, 21 U.S.C. section 360bbb-3(b)(1), unless the authorization is terminated or revoked.     Resp Syncytial Virus by PCR NEGATIVE NEGATIVE Final    Comment: (NOTE) Fact Sheet for Patients: BloggerCourse.com  Fact Sheet for Healthcare Providers: SeriousBroker.it  This test is not yet approved or cleared by the [place]  FDA and has been authorized for detection and/or diagnosis of SARS-CoV-2 by FDA under an Emergency Use Authorization (EUA). This EUA will remain in effect  (meaning this test can be used) for the duration of the COVID-19 declaration under Section 564(b)(1) of the Act, 21 U.S.C. section 360bbb-3(b)(1), unless the authorization is terminated or revoked.  Performed at [facility], [address] [place], [place] [address]   Urine Culture     Status: None   Collection Time: 07/01/24 10:31 AM   Specimen: Urine, Random  Result Value Ref Range Status   Specimen Description URINE, RANDOM  Final   Special Requests NONE Reflexed from [id number]  Final   Culture   Final    NO GROWTH Performed at [facility], [address], [place], [place] [address]    Report Status 07/03/2024 FINAL  Final  Blood Culture (routine x 2)     Status: None   Collection Time: 07/01/24 10:49 AM   Specimen: BLOOD RIGHT HAND  Result Value Ref Range Status   Specimen Description BLOOD RIGHT HAND  Final   Special Requests   Final    BOTTLES DRAWN AEROBIC AND ANAEROBIC Blood Culture adequate volume   Culture   Final    NO GROWTH 5 DAYS Performed at [facility], [address] [place], [place] [address]    Report Status 07/06/2024 FINAL  Final  Blood Culture (routine x 2)     Status: None   Collection Time: 07/01/24 10:49 AM   Specimen: BLOOD LEFT FOREARM  Result Value Ref Range Status   Specimen Description BLOOD LEFT FOREARM  Final   Special Requests   Final    BOTTLES DRAWN AEROBIC AND ANAEROBIC Blood Culture adequate volume   Culture   Final    NO GROWTH 5 DAYS Performed at [facility], [address] [place], [place] [address]    Report Status 07/06/2024 FINAL  Final  MRSA Next Gen by PCR, Nasal     Status: None   Collection Time: 07/02/24 12:22 AM   Specimen: Nasal Mucosa; Nasal Swab  Result Value Ref Range Status   MRSA by PCR Next Gen NOT DETECTED NOT DETECTED Final    Comment: (NOTE) The GeneXpert MRSA Assay (FDA approved for NASAL specimens only), is one component of a comprehensive MRSA colonization surveillance program.  It is not intended to diagnose MRSA infection nor to guide or monitor treatment for MRSA infections. Test performance is not FDA approved in patients less than 42 years old. Performed at [facility], [address] [place], [place] [address]   Urine Culture (for pregnant, neutropenic or urologic patients or patients with an indwelling urinary catheter)     Status: None   Collection Time: 07/02/24 11:19 AM   Specimen: Urine, Catheterized  Result Value Ref Range Status   Specimen Description URINE, CATHETERIZED  Final   Special Requests Normal  Final   Culture   Final  NO GROWTH Performed at [facility], [address] [place], [place] [address]    Report Status 07/03/2024 FINAL  Final      Studies: No results found.  Scheduled Meds:  amantadine   100 mg Oral QHS   Chlorhexidine  Gluconate Cloth  6 each Topical Daily   cloZAPine   200 mg Oral BID   finasteride   5 mg Oral Daily   linaclotide   290 mcg Oral QAC breakfast   melatonin  10 mg Oral QHS   midodrine   10 mg Oral TID WC   pantoprazole   40 mg Oral Daily   PARoxetine   40 mg Oral QPM   polyethylene glycol  17 g Oral BID   senna-docusate  1 tablet Oral BID   tamsulosin   0.4 mg Oral QPC supper    Continuous Infusions:   LOS: 9 days     Derryl Duval, MD [facility]  If 7PM-7AM, please contact night-coverage [email or web address] 07/10/2024, 2:03 PM

## 2024-07-10 NOTE — Progress Notes (Signed)
 Occupational Therapy Treatment Patient Details Name: Jordan Kennedy MRN: [id number] DOB: January 28, 1956 Today's Date: 07/10/2024   History of present illness Pt is a 68 y.o. with admission in August for septic shock and UTI and discharged 8/27 with an indwelling catheter. Admitted  9/5 for constipation and possible colitis and discharged  home on 9/7.  Admitted 9/11 with hypotension, generalized weakness and abdominal pain.  Pt with sepsis and UTI. PMH: cognitive impairment, schizophrenia and intellectual disability , CHF, tardive dyskinesia, chronic constipation, anemia, hx of SBO, BPH.   OT comments  Pt. Seen for skilled OT treatment session.  Pt. Able to complete bed mobility with S. Assistance only for foley management once in bed.  Standing at sink for grooming task, cues for sequencing but tolerated well. Ambulation to/from b.room with CGA/MIN A and cues for RW management.  Cont. With acute OT POC.        If plan is discharge home, recommend the following:  A little help with walking and/or transfers;A lot of help with bathing/dressing/bathroom;Direct supervision/assist for medications management;Direct supervision/assist for financial management;Assist for transportation;Help with stairs or ramp for entrance   Equipment Recommendations  None recommended by OT    Recommendations for Other Services      Precautions / Restrictions Precautions Precautions: Fall Recall of Precautions/Restrictions: Impaired Precaution/Restrictions Comments: foley       Mobility Bed Mobility Overal bed mobility: Needs Assistance Bed Mobility: Supine to Sit, Sit to Supine     Supine to sit: Supervision, HOB elevated Sit to supine: Supervision, HOB elevated   General bed mobility comments: assist for set up with foley to sit, assist for legs and positioning to supine    Transfers                         Balance                                           ADL either  performed or assessed with clinical judgement   ADL Overall ADL's : Needs assistance/impaired     Grooming: Wash/dry hands;Cueing for sequencing;Contact guard assist;Standing Grooming Details (indicate cue type and reason): standing at sink                 Toilet Transfer: Contact guard assist;Rolling walker (2 wheels);Minimal assistance;Cueing for sequencing Toilet Transfer Details (indicate cue type and reason): simulated-ambulated in and out of b.room but did not need to use it         Functional mobility during ADLs: Contact guard assist;Minimal assistance;Rolling walker (2 wheels) General ADL Comments: cues for RW management having tendancy to want to leave it during turns and not keep it with him    Extremity/Trunk Assessment              Vision       Perception     Praxis     Communication     Cognition Arousal: Alert Behavior During Therapy: Impulsive Cognition: History of cognitive impairments, No family/caregiver present to determine baseline, Cognition impaired           Executive functioning impairment (select all impairments): Sequencing, Organization, Problem solving OT - Cognition Comments: difficulty with initiation of tasks; requires increased time for prociessing; most likely close to baseline  Cueing      Exercises      Shoulder Instructions       General Comments      Pertinent Vitals/ Pain       Pain Assessment Pain Assessment: No/denies pain  Home Living                                          Prior Functioning/Environment              Frequency  Min 2X/week        Progress Toward Goals  OT Goals(current goals can now be found in the care plan section)  Progress towards OT goals: Progressing toward goals     Plan      Co-evaluation                 AM-PAC OT 6 Clicks Daily Activity     Outcome Measure   Help from another person eating  meals?: A Little Help from another person taking care of personal grooming?: A Little Help from another person toileting, which includes using toliet, bedpan, or urinal?: A Lot Help from another person bathing (including washing, rinsing, drying)?: A Lot Help from another person to put on and taking off regular upper body clothing?: A Little Help from another person to put on and taking off regular lower body clothing?: A Lot 6 Click Score: 15    End of Session Equipment Utilized During Treatment: Gait belt;Rolling walker (2 wheels)  OT Visit Diagnosis: Unsteadiness on feet (R26.81);Other abnormalities of gait and mobility (R26.89);Muscle weakness (generalized) (M62.81);Other symptoms and signs involving cognitive function   Activity Tolerance Patient tolerated treatment well   Patient Left in bed;with call bell/phone within reach;with bed alarm set   Nurse Communication Other (comment) (rn states ok to work with pt. she also advised no more g. crackers today but yes to diet sprite and aware of pts. dinner request burger/fries/ice cream)        Time: 8598-8585 OT Time Calculation (min): 13 min  Charges: OT General Charges $OT Visit: 1 Visit OT Treatments $Self Care/Home Management : 8-22 mins  Randall, COTA/L Acute Rehabilitation [phone]   CHRISTELLA Nest Lorraine-COTA/L  07/10/2024, 2:37 PM

## 2024-07-10 NOTE — Plan of Care (Signed)

## 2024-07-10 NOTE — Progress Notes (Signed)
 Mobility Specialist: Progress Note   07/10/24 1000  Mobility  Activity Ambulated with assistance  Level of Assistance Minimal assist, patient does 75% or more  Assistive Device Front wheel walker  Distance Ambulated (ft) 200 ft  Activity Response Tolerated well  Mobility Referral Yes  Mobility visit 1 Mobility  Mobility Specialist Start Time (ACUTE ONLY) 1012  Mobility Specialist Stop Time (ACUTE ONLY) 1026  Mobility Specialist Time Calculation (min) (ACUTE ONLY) 14 min    Pt received in bed, agreeable to mobility session. SV for bed mobility, CGA for STS. MinA for steadying and physical assist needed to correct mild posterior lean throughout ambulation. No complaints. Returned to room without fault. Left in bed with all needs met, call bell in reach. Bed alarm on.   Jordan Kennedy Mobility Specialist Please contact via SecureChat or Rehab office at [phone]

## 2024-07-10 NOTE — Consult Note (Signed)
 Consultation Note Date: 07/10/2024   Patient Name: Jordan Kennedy  DOB: 1956/08/03  MRN: [id number]  Age / Sex: 68 y.o., male  PCP: [name] HERO, MD Referring Physician: Mcarthur Pick, MD  Reason for Consultation: Establishing goals of care  HPI/Patient Profile: 68 y.o. male  with past medical history of  schizophrenia and intellectual disability, HFrEF (EF <20% on last echo in 04/2023), BPH and bladder outlet obstruction, admitted on 07/01/2024 with hypotension at outpatient cardiology office.   Patient has had 4 admissions in the last 6 months, most recently this past week on 9/5 for presumed colitis and discharged on midodrine  p/w hypotension 2/2 sepsis of presumed urinary origin. Admission in 05/2024 for septic shock secondary to E. Coli and Enterobacter UTI and discharged 8/27 with an indwelling catheter.  In the ED, he was briefly on levo for hypotension and admitted for sepsis of presumed urinary origin/CAUTI.  Ultimately, blood cultures and urine cultures did not show any growth.  PMT has been consulted to assist with goals of care conversation given end-stage CHF, frequent UTIs and hospitalizations.  Clinical Assessment and Goals of Care:  I have reviewed medical records including EPIC notes, labs and imaging, assessed the patient and then met at the bedside to discuss diagnosis prognosis, GOC, EOL wishes, disposition and options.  I attempted to call patient's legal guardian on 2 different phone numbers but was unable to reach.  Left voicemails with request for return call.  I introduced Palliative Medicine as specialized medical care for people living with serious illness. It focuses on providing relief from the symptoms and stress of a serious illness. The goal is to improve quality of life for both the patient and the family.  We discussed a brief life review of the patient and then focused on their current illness.   I attempted to elicit values  and goals of care important to the patient.    Medical History Review and Understanding:  Patient states that he knows his heart is weak, though it does not hurt him.  Social History: Patient is a ward of DSS with legal guardian.  He tells me he has 2 living sisters.  His parents, 1 brother and 1 sister have all died.  He enjoys watching TV.  He was raised as a Scientist, product/process development.  Palliative Symptoms: Dizziness, stomachache, weakness  Discussion: Patient was quite focused on getting more water and snacks during our conversation and participation is limited by his intellectual disability.  He is unable to tell me how he knows his legal guardian.  He confirms that he is spiritual, though he has not been to church in a while.  He would be open to spiritual care support during this admission for prayer.   We discussed his priorities as much as he was able, such as whether he would prioritize comfort (I.e, eating what he wants and having as much to drink as he wants) versus abiding by fluid restrictions and heart healthy diet.  He is able to tell me that he would choose to focus on his health if he had to pick, though was asking for his snacks shortly after. I attempted to call his legal guardian [facility] on the 2 numbers listed but was unable to reach.  Discussed with primary attending.    SUMMARY OF RECOMMENDATIONS   - Continue attempts to reach legal guardian for goals of care discussion - Spiritual care consulted at patient's request - Psychosocial and emotional support provided - PMT will continue  to follow and support  Prognosis:  Poor, hospice appropriate  Discharge Planning: To Be Determined      Primary Diagnoses: Present on Admission:  Sepsis due to gram-negative urinary tract infection (HCC)  Sepsis (HCC)   Physical Exam Vitals and nursing note reviewed.  Constitutional:      General: He is not in acute distress. HENT:     Head: Normocephalic and atraumatic.   Cardiovascular:     Rate and Rhythm: Normal rate.  Pulmonary:     Effort: Pulmonary effort is normal.  Neurological:     Mental Status: He is alert. Mental status is at baseline.  Psychiatric:        Behavior: Behavior is cooperative.        Cognition and Memory: Cognition is impaired.     Comments: Perseveration on snacks     Vital Signs: BP 112/84 (BP Location: Left Arm)   Pulse (!) 101   Temp 98.2 F (36.8 C)   Resp 17   Ht 6' 5 (1.956 m)   Wt 82.2 kg   SpO2 97%   BMI 21.49 kg/m  Pain Scale: 0-10   Pain Score: 0-No pain   SpO2: SpO2: 97 % O2 Device:SpO2: 97 % O2 Flow Rate: .     MDM: Moderate   Problems Addressed: One or more chronic illnesses with severe exacerbation, progression, or side effects of treatment.  Amount and/or Complexity of Data: Category 1:Review of prior external note(s) from each unique source, Review of the result(s) of each unique test, and Assessment requiring an independent historian(s), Category 2:Independent interpretation of a test performed by another physician/other qualified health care professional (not separately reported), and Category 3:Discussion of management or test interpretation with external physician/other qualified health care professional/appropriate source (not separately reported)  Risks: Diagnosis or treatment significantly limited by social determinants of health     Navea Woodrow SHAUNNA Fell, PA-C  Palliative Medicine Team Team phone # [phone]  Thank you for allowing the Palliative Medicine Team to assist in the care of this patient. Please utilize secure chat with additional questions, if there is no response within 30 minutes please call the above phone number.  Palliative Medicine Team providers are available by phone from 7am to 7pm daily and can be reached through the team cell phone.  Should this patient require assistance outside of these hours, please call the patient's attending physician.

## 2024-07-11 DIAGNOSIS — I5022 Chronic systolic (congestive) heart failure: Secondary | ICD-10-CM | POA: Diagnosis not present

## 2024-07-11 DIAGNOSIS — A4150 Gram-negative sepsis, unspecified: Secondary | ICD-10-CM | POA: Diagnosis not present

## 2024-07-11 DIAGNOSIS — E861 Hypovolemia: Secondary | ICD-10-CM

## 2024-07-11 DIAGNOSIS — N390 Urinary tract infection, site not specified: Secondary | ICD-10-CM | POA: Diagnosis not present

## 2024-07-11 DIAGNOSIS — Z515 Encounter for palliative care: Secondary | ICD-10-CM | POA: Diagnosis not present

## 2024-07-11 MED ORDER — METOPROLOL SUCCINATE ER 25 MG PO TB24
12.5000 mg | ORAL_TABLET | Freq: Every morning | ORAL | Status: DC
  Administered 2024-07-11: 12.5 mg via ORAL
  Filled 2024-07-11: qty 1

## 2024-07-11 NOTE — Plan of Care (Signed)
  Problem: Nutrition: Goal: Adequate nutrition will be maintained Outcome: Progressing   Problem: Pain Managment: Goal: General experience of comfort will improve and/or be controlled Outcome: Progressing   Problem: Education: Goal: Knowledge of General Education information will improve Description: Including pain rating scale, medication(s)/side effects and non-pharmacologic comfort measures Outcome: Not Progressing   Problem: Health Behavior/Discharge Planning: Goal: Ability to manage health-related needs will improve Outcome: Not Progressing   Problem: Elimination: Goal: Will not experience complications related to bowel motility Outcome: Not Progressing

## 2024-07-11 NOTE — Progress Notes (Addendum)
 Daily Progress Note   Patient Name: Jordan Kennedy       Date: 07/11/2024 DOB: 08/06/1956  Age: 68 y.o. MRN#: [id number] Attending Physician: Jordan Pick, MD Primary Care Physician: Jordan Garnette HERO, MD Admit Date: 07/01/2024  Reason for Consultation/Follow-up: Establishing goals of care  Subjective: Medical records reviewed including progress notes, labs and imaging. Patient assessed at the bedside. He is sleeping comfortably without signs of pain or distress. No visitors present.  Called patient's DSS legal guardian Jordan Kennedy to introduce role of palliative medicine as an extra layer of support and discuss goals of care. She shares that patient's QOL has been worsening over the past 2 months with decline in his functioning and ability to participate in his day program. He also has a history of throat cancer and IBS. She notes he has a habit of masturbating very frequently.   Provided medical update and education on patient's end-stage CHF. Reviewed rationale for urinary catheter given patient's bladder outlet obstruction. She was aware of some prostrate enlargement but has never been told about the bladder or end-stage CHF. This is very surprising for her to hear and she can understand the concern for patient's quality of life and worsening symptom burden in the future. We discussed the cycle of recurrent infections and CHF exacerbation that is expected given lack of long-term treatment options and complicating hypotension. She asks about options to break the cycle. Reviewed that patient would be a candidate for hospice and discussed available resources as well as poor prognosis, likely around 6 months. Outpatient palliative care follow up was also explained and offered.   Recommended consideration of DNR status, understanding evidenced-based poor  outcomes in similar hospitalized patients, as the cause of the arrest is likely associated with chronic/terminal disease rather than a reversible acute cardio-pulmonary event. She will email me a copy of the DNR paperwork that is required for DSS to make these decisions on patients' behalf.   Questions and concerns addressed. PMT will continue to support holistically.   Length of Stay: 10   Physical Exam Vitals and nursing note reviewed.  Constitutional:      General: He is sleeping. He is not in acute distress. HENT:     Head: Normocephalic and atraumatic.  Cardiovascular:     Rate and Rhythm: Normal rate.  Pulmonary:     Effort: Pulmonary effort is normal.            Vital Signs: BP 101/74 (BP Location: Left Arm)   Pulse 99   Temp 98.6 F (37 C) (Oral)   Resp 18   Ht 6' 5 (1.956 m)  Wt 82.2 kg   SpO2 97%   BMI 21.49 kg/m  SpO2: SpO2: 97 % O2 Device: O2 Device: Room Air O2 Flow Rate:        Palliative Care Assessment & Plan   Patient Profile: 68 y.o. male  with past medical history of  schizophrenia and intellectual disability, HFrEF (EF <20% on last echo in 04/2023), BPH and bladder outlet obstruction, admitted on 07/01/2024 with hypotension at outpatient cardiology office.    Patient has had 4 admissions in the last 6 months, most recently this past week on 9/5 for presumed colitis and discharged on midodrine  p/w hypotension 2/2 sepsis of presumed urinary origin. Admission in 05/2024 for septic shock secondary to E. Coli and Enterobacter UTI and discharged 8/27 with an indwelling catheter.   In the ED, he was briefly on levo for hypotension and admitted for sepsis of presumed urinary origin/CAUTI.  Ultimately, blood cultures and urine cultures did not show any growth.   PMT has been consulted to assist with goals of care conversation given end-stage CHF, frequent UTIs and hospitalizations.  Assessment: Goals of care conversation HFrEF, end-stage Concern for  sepsis Hypotension  Recommendations/Plan: Patient's legal guardian will send required paperwork to finalize DNR Continue current care plan Patient's legal guardian understands poor prognosis now that she is aware of end-stage CHF diagnosis. Agreeable to outpatient palliative care follow up and transition to hospice based on his trajectory  PMT will continue to follow and support   Prognosis:  < 6 months  Discharge Planning: Skilled Nursing Facility for rehab with Palliative care service follow-up  Care plan was discussed with Patient's legal guardian, primary MD         Jordan Jordan Fell, PA-C  Palliative Medicine Team Team phone # [phone]  Thank you for allowing the Palliative Medicine Team to assist in the care of this patient. Please utilize secure chat with additional questions, if there is no response within 30 minutes please call the above phone number.  Palliative Medicine Team providers are available by phone from 7am to 7pm daily and can be reached through the team cell phone.  Should this patient require assistance outside of these hours, please call the patient's attending physician.    Billing based on MDM: High  Problems Addressed: One or more chronic illnesses with severe exacerbation, progression, or side effects of treatment.  Amount and/or Complexity of Data: Category 1:Review of the result(s) of each unique test and Assessment requiring an independent historian(s), Category 2:Independent interpretation of a test performed by another physician/other qualified health care professional (not separately reported), and Category 3:Discussion of management or test interpretation with external physician/other qualified health care professional/appropriate source (not separately reported)  Risks: Decision not to resuscitate or to de-escalate care because of poor prognosis

## 2024-07-11 NOTE — TOC Transition Note (Signed)
 Transition of Care [facility]) - Discharge Note   Patient Details  Name: Jordan Kennedy MRN: [id number] Date of Birth: March 27, 1956  [facility] CM/SW Contact:  Gwenn Julien Norris, [place] Phone Number: 07/11/2024, 12:10 PM   Clinical Narrative:  Pt for dc to [facility] today. Spoke to Kia in admissions who confirmed they are prepared to admit pt to room 124A. Pt's legal guardian Denver Mura [phone] notified of dc. RN provided with number for report and PTAR arranged for transport. SW signing off at dc.   Julien Gwenn, MSW, LCSW [phone] (coverage)       Final next level of care: Skilled Nursing Facility Barriers to Discharge: Barriers Resolved   Patient Goals and CMS Choice   CMS Medicare.gov Compare Post Acute Care list provided to:: Legal Guardian Choice offered to / list presented to : [facility] POA / Guardian Maury ownership interest in [facility]provided to:: [facility] POA / Guardian    Discharge Placement                Patient to be transferred to facility by: PTAR Name of family member notified: [name] Monroe/DSS guardian Patient and family notified of of transfer: 07/11/24  Discharge Plan and Services Additional resources added to the After Visit Summary for   In-house Referral: Clinical Social Work   Post Acute Care Choice: Skilled Nursing Facility                               Social Drivers of Health (SDOH) Interventions SDOH Screenings   Food Insecurity: Patient Unable To Answer (07/02/2024)  Housing: Patient Unable To Answer (07/02/2024)  Transportation Needs: Patient Unable To Answer (07/02/2024)  Utilities: Patient Unable To Answer (07/02/2024)  Alcohol Screen: Low Risk  (11/05/2023)  Depression (PHQ2-9): Low Risk  (11/05/2023)  Financial Resource Strain: Low Risk  (11/05/2023)  Physical Activity: Inactive (11/05/2023)  Social Connections: Patient Unable To Answer (07/02/2024)  Stress: No Stress Concern Present  (11/05/2023)  Tobacco Use: Medium Risk (07/01/2024)  Health Literacy: Adequate Health Literacy (11/05/2023)     Readmission Risk Interventions    06/28/2024   11:41 AM 06/11/2024   10:27 AM  Readmission Risk Prevention Plan  Transportation Screening Complete Complete  PCP or Specialist Appt within 5-7 Days Complete   PCP or Specialist Appt within 3-5 Days  Complete  Home Care Screening Complete   Medication Review (RN CM) Complete   HRI or Home Care Consult  Complete  Social Work Consult for Recovery Care Planning/Counseling  Complete  Palliative Care Screening  Not Applicable  Medication Review Oceanographer)  Complete

## 2024-07-11 NOTE — Discharge Summary (Addendum)
 Physician Discharge Summary  Damyn Weitzel FMW:[id number] DOB: May 16, 1956 DOA: 07/01/2024  PCP: [name] HERO, MD  Admit date: 07/01/2024 Discharge date: 07/11/2024  Admitted From: Home Disposition: Home  Recommendations for Outpatient Follow-up:  Follow up with PCP in 1 week with repeat CBC/BMP Follow up in ED if symptoms worsen or new appear Follow-up with cardiology in 2 weeks Follow-up with cardiology in 2 weeks. Follow-up with palliative care to discuss goals of care   Discharge Condition: fair CODE STATUS: Full Diet recommendation: Heart healthy, 2 g sodium restriction, 2 L fluid restriction per day  Brief/Interim Summary: Jordan Kennedy is a 68 y.o. male with medical history significant of schizophrenia and intellectual disability, HFrEF (EF <20%), hx of BPH and bladder outlet obstruction w/ admission in 05/2024 for septic shock secondary to E. Coli and Enterobacter UTI and discharged 8/27 with an indwelling catheter, as well as recent admission on 9/5 for presumed colitis and discharged on midodrine , now p/w hypotension 2/2 sepsis of presumed urinary origin. Pt is a poor historian. PTA, was evaluated at OP cards office and noted to be hypotensive (60/40s) for which EMS was activated and pt BIBA to ED for further evaluation. In the ED, pt hypotensive 70/50s (MAP 60s), in the setting of low EF. Labs notable for  WBC 15.9 and Cr 1.3. UA pos for LE, but neg for nitrite. EDP consutled CCM who agreed with admission to ICU.  In the ICU, patient was started on Levophed , which was eventually titrated off with BP maintaining, although still soft.  Patient seems to be improving overall.  [facility] hospitalist resumed care on 07/03/2024.   Palliative care consulted.  DSS is working on getting DNR paperwork. Patient will be discharged back to SNF.  Will need follow-up with palliative care to discontinue goals of care discussion. Need to follow-up with PCP within a week as well as cardiology.  Sepsis  shock of presumed urinary origin - Was admitted to the ICU.  Prelim showed UTI but cultures, blood culture and urine culture were negative.  Patient continued to require Foley catheterization. Completed empiric antibiotic therapy in the hospital.   Continues to be on midodrine  10 mg 3 times daily for soft blood pressures.    HFrEF Noted persistent tachycardia Last echo showed EF of less than 20%, noted regional wall motion abnormality, left ventricular diastolic function could not be evaluated Will continue to hold Entresto  due to soft blood pressure.  Farxiga  on hold due to recurrent UTI. Continue low-dose metoprolol .   .  Did not requireLasix in the hospital.  Need to monitor closely.  Can use as needed Lasix  based on weight gain and symptoms.  BPH PTA finasteride  and tamsulosin    Schizophrenia PTA Clozaril  and Paxil    Goals of care discussion  palliative care on board to discuss goals of care given end-stage CHF, frequent UTIs, frequent hospitalizations. Patient as well as DSS legal guardian to work on DNR paperwork. Need to follow-up with palliative care outpatient.    Discharge Diagnoses:  Principal Problem:   Sepsis due to gram-negative urinary tract infection (HCC) Active Problems:   Hypotension due to hypovolemia   Sepsis [facility])    Discharge Instructions  Discharge Instructions     (HEART FAILURE PATIENTS) Call MD:  Anytime you have any of the following symptoms: 1) 3 pound weight gain in 24 hours or 5 pounds in 1 week 2) shortness of breath, with or without a dry hacking cough 3) swelling in the hands, feet or stomach 4)  if you have to sleep on extra pillows at night in order to breathe.   Complete by: As directed    Ambulatory referral to Cardiology   Complete by: As directed    Chf f/u 2 weeks   Call MD for:  persistant nausea and vomiting   Complete by: As directed    Call MD for:  severe uncontrolled pain   Complete by: As directed    Call MD for:  temperature  >100.4   Complete by: As directed    Diet - low sodium heart healthy   Complete by: As directed    Fluid restriction 2000 ml/day   Discharge instructions   Complete by: As directed    1.  Continue to hold Entresto  due to low blood pressure.  Hold aspirin  due to hematuria.  Hold Farxiga  due to recurrent UTIs. 2.  Follow-up with PCP within 1 week.  Continue to follow-up with cardiology as well 3.  Follow-up with palliative care   Increase activity slowly   Complete by: As directed       Allergies as of 07/11/2024   No Known Allergies      Medication List     PAUSE taking these medications    Farxiga  10 MG Tabs tablet Wait to take this until your doctor or other care provider tells you to start again. Generic drug: dapagliflozin  propanediol TAKE 1 TABLET BY MOUTH EVERY DAY What changed: when to take this   sacubitril -valsartan  24-26 MG Wait to take this until your doctor or other care provider tells you to start again. Commonly known as: ENTRESTO  Take 1 tablet by mouth 2 (two) times daily. 8am & 8 pm       STOP taking these medications    cephALEXin  500 MG capsule Commonly known as: KEFLEX    zolpidem  10 MG tablet Commonly known as: AMBIEN        TAKE these medications    amantadine  100 MG capsule Commonly known as: SYMMETREL  Take 100 mg by mouth at bedtime.   Aspirin  EC Adult Low Dose 81 MG tablet Generic drug: aspirin  EC TAKE 1 TABLET BY MOUTH EVERY DAY What changed: when to take this   B-12 1000 MCG Tabs Take 1 tablet (1,000 mcg total) by mouth daily.   cloZAPine  100 MG tablet Commonly known as: CLOZARIL  Take 2 tablets (200 mg total) by mouth 2 (two) times daily.   finasteride  5 MG tablet Commonly known as: PROSCAR  Take 1 tablet (5 mg total) by mouth daily. What changed: when to take this   furosemide  40 MG tablet Commonly known as: LASIX  Take 40 mg by mouth as needed for fluid.   Linzess  290 MCG Caps capsule Generic drug: linaclotide  TAKE 1  CAPSULE BY MOUTH EVERY MORNING BEFORE BREAKFAST   Melatonin 10 MG Subl Place 10 mg under the tongue at bedtime.   metoCLOPramide  5 MG tablet Commonly known as: REGLAN  Take 5 mg by mouth 2 (two) times daily.   metoprolol  succinate 25 MG 24 hr tablet Commonly known as: TOPROL -XL Take 12.5 mg by mouth in the morning.   midodrine  10 MG tablet Commonly known as: PROAMATINE  Take 1 tablet (10 mg total) by mouth 3 (three) times daily with meals. What changed: when to take this   Myrbetriq  50 MG Tb24 tablet Generic drug: mirabegron  ER Take 50 mg by mouth at bedtime.   omeprazole  40 MG capsule Commonly known as: PRILOSEC TAKE 1 CAPSULE BY MOUTH EVERY EVENING   PARoxetine  40 MG tablet  Commonly known as: PAXIL  Take 40 mg by mouth every evening.   polyethylene glycol powder 17 GM/SCOOP powder Commonly known as: GLYCOLAX /MIRALAX  Take 17 g by mouth 2 (two) times daily. Dissolve 1 capful (17g) in 4-8 ounces of liquid and take by mouth daily.   Stimulant Laxative 8.6-50 MG tablet Generic drug: senna-docusate Take 1 tablet by mouth 2 (two) times daily.   tamsulosin  0.4 MG Caps capsule Commonly known as: FLOMAX  Take 0.4 mg by mouth at bedtime.        Contact information for follow-up providers     [name] HERO, MD Follow up in 1 week(s).   Specialty: Family Medicine Contact information: [address] [place] [address] [phone]         Sheena Pugh, DO. Go in 2 week(s).   Specialty: Cardiology             Contact information for after-discharge care     Destination     [facility] .   Service: Skilled Nursing Contact information: [address] [place]  [address] [phone]                    No Known Allergies  Consultations:    Procedures/Studies: ECHOCARDIOGRAM COMPLETE Result Date: 07/02/2024    ECHOCARDIOGRAM REPORT   Patient Name:   UNKNOWN SCHLEYER Date of Exam: 07/02/2024 Medical Rec #:   [id number]      Height:       77.0 in Accession #:    [id number]     Weight:       173.0 lb Date of Birth:  [birth year]      BSA:          2.103 m Patient Age:    67 years       BP:           100/69 mmHg Patient Gender: M              HR:           108 bpm. Exam Location:  Inpatient Procedure: 2D Echo, Intracardiac Opacification Agent, Cardiac Doppler and Color            Doppler (Both Spectral and Color Flow Doppler were utilized during            procedure). Indications:    CHF  History:        Patient has prior history of Echocardiogram examinations, most                 recent 05/02/2023. Arrythmias:LBBB and PVC; Risk                 Factors:Hypertension and Former Smoker.  Sonographer:    Juliene Rucks Referring Phys: [id number] JESSICA MARSHALL IMPRESSIONS  1. Left ventricular ejection fraction, by estimation, is <20%. The left ventricle has severely decreased function. The left ventricle demonstrates regional wall motion abnormalities (see scoring diagram/findings for description). The left ventricular internal cavity size was moderately dilated. Left ventricular diastolic function could not be evaluated.  2. Right ventricular systolic function is normal. The right ventricular size is normal.  3. Left atrial size was mildly dilated.  4. The mitral valve is normal in structure. Mild mitral valve regurgitation. No evidence of mitral stenosis.  5. The aortic valve is normal in structure. Aortic valve regurgitation is not visualized. No aortic stenosis is present.  6. The inferior vena cava is normal in size with greater than 50% respiratory variability, suggesting  right atrial pressure of 3 mmHg. FINDINGS  Left Ventricle: Left ventricular ejection fraction, by estimation, is <20%. The left ventricle has severely decreased function. The left ventricle demonstrates regional wall motion abnormalities. The left ventricular internal cavity size was moderately dilated. There is no left ventricular hypertrophy. Left  ventricular diastolic function could not be evaluated.  LV Wall Scoring: The anterior septum, mid inferoseptal segment, and basal inferoseptal segment are akinetic. The entire anterior wall, entire lateral wall, entire apex, and entire inferior wall are hypokinetic. Right Ventricle: The right ventricular size is normal. No increase in right ventricular wall thickness. Right ventricular systolic function is normal. Left Atrium: Left atrial size was mildly dilated. Right Atrium: Right atrial size was normal in size. Pericardium: There is no evidence of pericardial effusion. Mitral Valve: The mitral valve is normal in structure. Mild mitral valve regurgitation. No evidence of mitral valve stenosis. Tricuspid Valve: The tricuspid valve is normal in structure. Tricuspid valve regurgitation is not demonstrated. No evidence of tricuspid stenosis. Aortic Valve: The aortic valve is normal in structure. Aortic valve regurgitation is not visualized. No aortic stenosis is present. Pulmonic Valve: The pulmonic valve was normal in structure. Pulmonic valve regurgitation is trivial. No evidence of pulmonic stenosis. Aorta: The aortic root is normal in size and structure. Venous: The inferior vena cava is normal in size with greater than 50% respiratory variability, suggesting right atrial pressure of 3 mmHg. IAS/Shunts: No atrial level shunt detected by color flow Doppler.  LEFT VENTRICLE PLAX 2D LVIDd:         6.80 cm LVIDs:         6.10 cm LV PW:         1.00 cm LV IVS:        1.00 cm LVOT diam:     2.40 cm LV SV:         62 LV SV Index:   29 LVOT Area:     4.52 cm  LV Volumes (MOD) LV vol d, MOD A2C: 364.0 ml LV vol d, MOD A4C: 414.0 ml LV vol s, MOD A2C: 287.0 ml LV vol s, MOD A4C: 348.0 ml LV SV MOD A2C:     77.0 ml LV SV MOD A4C:     414.0 ml LV SV MOD BP:      70.6 ml RIGHT VENTRICLE          IVC RV Basal diam:  4.00 cm  IVC diam: 1.90 cm RV Mid diam:    2.90 cm LEFT ATRIUM             Index        RIGHT ATRIUM            Index LA diam:        2.90 cm 1.38 cm/m   RA Area:     14.30 cm LA Vol (A2C):   68.9 ml 32.76 ml/m  RA Volume:   32.80 ml  15.60 ml/m LA Vol (A4C):   69.2 ml 32.90 ml/m LA Biplane Vol: 72.3 ml 34.38 ml/m  AORTIC VALVE LVOT Vmax:   95.00 cm/s LVOT Vmean:  66.900 cm/s LVOT VTI:    0.136 m  AORTA Ao Root diam: 3.40 cm MITRAL VALVE                TRICUSPID VALVE MV Area (PHT): 5.97 cm     TR Peak grad:   11.2 mmHg MV Decel Time: 127 msec     TR Vmax:  167.00 cm/s MV E velocity: 122.00 cm/s                             SHUNTS                             Systemic VTI:  0.14 m                             Systemic Diam: 2.40 cm Dub Tobb DO Electronically signed by Dub Huntsman DO Signature Date/Time: 07/02/2024/12:49:13 PM    Final    CT Angio Chest PE W and/or Wo Contrast Result Date: 07/01/2024 CLINICAL DATA:  Hypotension.  Pyelonephritis. EXAM: CT ANGIOGRAPHY CHEST CT ABDOMEN AND PELVIS WITH CONTRAST TECHNIQUE: Multidetector CT imaging of the chest was performed using the standard protocol during bolus administration of intravenous contrast. Multiplanar CT image reconstructions and MIPs were obtained to evaluate the vascular anatomy. Multidetector CT imaging of the abdomen and pelvis was performed using the standard protocol during bolus administration of intravenous contrast. RADIATION DOSE REDUCTION: This exam was performed according to the departmental dose-optimization program which includes automated exposure control, adjustment of the mA and/or kV according to patient size and/or use of iterative reconstruction technique. CONTRAST:  150mL OMNIPAQUE  IOHEXOL  350 MG/ML SOLN COMPARISON:  Multiple exams, including 06/26/2023 and 06/26/2023 FINDINGS: Despite efforts by the technologist and patient, motion artifact is present on today's exam and could not be eliminated. This reduces exam sensitivity and specificity. CTA CHEST FINDINGS Cardiovascular: No filling defect is identified in the pulmonary arterial  tree to suggest pulmonary embolus. Moderate cardiomegaly with left ventricular prominence. Small loculations of subclavian venous gas, most likely iatrogenic from IV line, and considered clinically inconsequential in the absence of a right to left cardiac shunt. Mediastinum/Nodes: 1 mildly dilated proximal half of the thoracic esophagus with an air-fluid level. Dysmotility not excluded. Right hilar node 0.9 cm in short axis on image 63 series 7. No overtly pathologic thoracic adenopathy observed. Lungs/Pleura: Patchy ground-glass opacities with some secondary pulmonary lobular interstitial accentuation at the lung apices, suspicious for mild pulmonary edema. Airway thickening is present, suggesting bronchitis or reactive airways disease. Musculoskeletal: Degenerative sternoclavicular arthropathy bilaterally. Thoracic spondylosis. Review of the MIP images confirms the above findings. CT ABDOMEN and PELVIS FINDINGS Hepatobiliary: Unremarkable Pancreas: Stable borderline prominence of dorsal pancreatic duct without other significant findings in the pancreas. Spleen: Unremarkable Adrenals/Urinary Tract: The kidneys are blurred by motion artifact. Dense contrast medium in the collecting systems, ureters, and urinary bladder, implying a delay between the CT angiogram of the chest and the CT abdomen. Reduced sensitivity for nonobstructive renal calculi. Foley catheter in the urinary bladder. Right bladder cellules along the right UVJ. Mild renal cortical hypoenhancement in the left upper kidney laterally as on image 29 series 2, similar to the 06/26/2024 exam and potentially reflecting pyelonephritis. Adrenal glands unremarkable. Stomach/Bowel: Prominent stool throughout the colon favors constipation. Nonspecific mildly dilated loops of upper abdominal small bowel without obvious obstruction, mild enteritis or small bowel ileus not excluded. Vascular/Lymphatic: Atherosclerosis is present, including aortoiliac atherosclerotic  disease. Small retroperitoneal lymph nodes are not pathologically enlarged but may be reactive. Reproductive: Considerable prostatomegaly. Prominent median lobe indents the bladder base. Other: Trace ascites along the left paracolic gutter. Musculoskeletal: Lumbar spondylosis. Review of the MIP images confirms the above findings. IMPRESSION: 1. No filling defect is identified  in the pulmonary arterial tree to suggest pulmonary embolus. 2. Moderate cardiomegaly with left ventricular prominence. 3. Patchy ground-glass opacities with some secondary pulmonary lobular interstitial accentuation at the lung apices, suspicious for mild pulmonary edema. 4. Airway thickening is present, suggesting bronchitis or reactive airways disease. 5. Mildly dilated proximal half of the thoracic esophagus with an air-fluid level. Dysmotility not excluded. 6. Prominent stool throughout the colon favors constipation. 7. Nonspecific mildly dilated loops of upper abdominal small bowel without obvious obstruction, mild enteritis or small bowel ileus not excluded. 8. Mild renal cortical hypoenhancement in the left upper kidney laterally, similar to the 06/26/2024 exam and potentially reflecting pyelonephritis. 9. Considerable prostatomegaly. 10. Trace ascites along the left paracolic gutter. 11.  Aortic Atherosclerosis (ICD10-I70.0). Electronically Signed   By: Ryan Salvage M.D.   On: 07/01/2024 12:50   CT ABDOMEN PELVIS W CONTRAST Result Date: 07/01/2024 CLINICAL DATA:  Hypotension.  Pyelonephritis. EXAM: CT ANGIOGRAPHY CHEST CT ABDOMEN AND PELVIS WITH CONTRAST TECHNIQUE: Multidetector CT imaging of the chest was performed using the standard protocol during bolus administration of intravenous contrast. Multiplanar CT image reconstructions and MIPs were obtained to evaluate the vascular anatomy. Multidetector CT imaging of the abdomen and pelvis was performed using the standard protocol during bolus administration of intravenous  contrast. RADIATION DOSE REDUCTION: This exam was performed according to the departmental dose-optimization program which includes automated exposure control, adjustment of the mA and/or kV according to patient size and/or use of iterative reconstruction technique. CONTRAST:  150mL OMNIPAQUE  IOHEXOL  350 MG/ML SOLN COMPARISON:  Multiple exams, including 06/26/2023 and 06/26/2023 FINDINGS: Despite efforts by the technologist and patient, motion artifact is present on today's exam and could not be eliminated. This reduces exam sensitivity and specificity. CTA CHEST FINDINGS Cardiovascular: No filling defect is identified in the pulmonary arterial tree to suggest pulmonary embolus. Moderate cardiomegaly with left ventricular prominence. Small loculations of subclavian venous gas, most likely iatrogenic from IV line, and considered clinically inconsequential in the absence of a right to left cardiac shunt. Mediastinum/Nodes: 1 mildly dilated proximal half of the thoracic esophagus with an air-fluid level. Dysmotility not excluded. Right hilar node 0.9 cm in short axis on image 63 series 7. No overtly pathologic thoracic adenopathy observed. Lungs/Pleura: Patchy ground-glass opacities with some secondary pulmonary lobular interstitial accentuation at the lung apices, suspicious for mild pulmonary edema. Airway thickening is present, suggesting bronchitis or reactive airways disease. Musculoskeletal: Degenerative sternoclavicular arthropathy bilaterally. Thoracic spondylosis. Review of the MIP images confirms the above findings. CT ABDOMEN and PELVIS FINDINGS Hepatobiliary: Unremarkable Pancreas: Stable borderline prominence of dorsal pancreatic duct without other significant findings in the pancreas. Spleen: Unremarkable Adrenals/Urinary Tract: The kidneys are blurred by motion artifact. Dense contrast medium in the collecting systems, ureters, and urinary bladder, implying a delay between the CT angiogram of the chest and  the CT abdomen. Reduced sensitivity for nonobstructive renal calculi. Foley catheter in the urinary bladder. Right bladder cellules along the right UVJ. Mild renal cortical hypoenhancement in the left upper kidney laterally as on image 29 series 2, similar to the 06/26/2024 exam and potentially reflecting pyelonephritis. Adrenal glands unremarkable. Stomach/Bowel: Prominent stool throughout the colon favors constipation. Nonspecific mildly dilated loops of upper abdominal small bowel without obvious obstruction, mild enteritis or small bowel ileus not excluded. Vascular/Lymphatic: Atherosclerosis is present, including aortoiliac atherosclerotic disease. Small retroperitoneal lymph nodes are not pathologically enlarged but may be reactive. Reproductive: Considerable prostatomegaly. Prominent median lobe indents the bladder base. Other: Trace ascites along the left paracolic  gutter. Musculoskeletal: Lumbar spondylosis. Review of the MIP images confirms the above findings. IMPRESSION: 1. No filling defect is identified in the pulmonary arterial tree to suggest pulmonary embolus. 2. Moderate cardiomegaly with left ventricular prominence. 3. Patchy ground-glass opacities with some secondary pulmonary lobular interstitial accentuation at the lung apices, suspicious for mild pulmonary edema. 4. Airway thickening is present, suggesting bronchitis or reactive airways disease. 5. Mildly dilated proximal half of the thoracic esophagus with an air-fluid level. Dysmotility not excluded. 6. Prominent stool throughout the colon favors constipation. 7. Nonspecific mildly dilated loops of upper abdominal small bowel without obvious obstruction, mild enteritis or small bowel ileus not excluded. 8. Mild renal cortical hypoenhancement in the left upper kidney laterally, similar to the 06/26/2024 exam and potentially reflecting pyelonephritis. 9. Considerable prostatomegaly. 10. Trace ascites along the left paracolic gutter. 11.  Aortic  Atherosclerosis (ICD10-I70.0). Electronically Signed   By: Ryan Salvage M.D.   On: 07/01/2024 12:50   DG Chest Port 1 View Result Date: 07/01/2024 CLINICAL DATA:  Sepsis, hypotension and dizziness. EXAM: PORTABLE CHEST 1 VIEW COMPARISON:  06/12/2024 FINDINGS: Mild enlargement of the cardiopericardial silhouette, without edema. No blunting of the costophrenic angles. The lungs appear clear. Mild lower thoracic spondylosis. IMPRESSION: 1. Mild enlargement of the cardiopericardial silhouette, without edema. 2. Mild lower thoracic spondylosis. Electronically Signed   By: Ryan Salvage M.D.   On: 07/01/2024 11:28   CT ABDOMEN PELVIS W CONTRAST Result Date: 06/26/2024 CLINICAL DATA:  Constipation and abdominal pain EXAM: CT ABDOMEN AND PELVIS WITH CONTRAST TECHNIQUE: Multidetector CT imaging of the abdomen and pelvis was performed using the standard protocol following bolus administration of intravenous contrast. RADIATION DOSE REDUCTION: This exam was performed according to the departmental dose-optimization program which includes automated exposure control, adjustment of the mA and/or kV according to patient size and/or use of iterative reconstruction technique. CONTRAST:  OMNIPAQUE  IOHEXOL  300 MG/ML  SOLN COMPARISON:  CT abdomen and pelvis dated 06/09/2024 FINDINGS: Lower chest: Mild bilateral lower lobe bronchiectasis. No pleural effusion or pneumothorax demonstrated. Partially imaged heart size is normal. Hepatobiliary: No focal hepatic lesions. No intra or extrahepatic biliary ductal dilation. Normal gallbladder. Pancreas: No focal lesions or main ductal dilation. Spleen: Normal in size without focal abnormality. Adrenals/Urinary Tract: No adrenal nodules. Interval resolution of hydronephrosis. No renal calculi. Patchy hypoenhancement of the left upper and interpolar kidney. Decompressed urinary bladder with catheter in-situ. Stomach/Bowel: Normal appearance of the stomach. Segmental mural  thickening of the underdistended rectosigmoid colon. Large volume stool throughout the colon upstream of this area. Appendix is not discretely seen. Vascular/Lymphatic: Aortic atherosclerosis. No enlarged abdominal or pelvic lymph nodes. Reproductive: Enlargement of the prostate with median lobe hypertrophy. Other: Small volume free fluid.  No free air. Musculoskeletal: No acute or abnormal lytic or blastic osseous lesions. Multilevel degenerative changes of the partially imaged thoracic and lumbar spine. IMPRESSION: 1. Large volume stool throughout the colon in keeping with reported history of constipation. Segmental mural thickening of the underdistended rectosigmoid colon may be related to underdistention or underlying colitis. 2. Patchy hypoenhancement of the left upper and interpolar kidney, suspicious for pyelonephritis. 3. Interval resolution of hydronephrosis. 4. Persistent prostatomegaly. 5. Aortic Atherosclerosis (ICD10-I70.0). Electronically Signed   By: Limin  Xu M.D.   On: 06/26/2024 12:52   DG CHEST PORT 1 VIEW Result Date: 06/12/2024 CLINICAL DATA:  Shortness of breath.  Urinary tract infection. EXAM: PORTABLE CHEST 1 VIEW COMPARISON:  05/15/2024 FINDINGS: Chronic cardiomegaly. Improvement in previously seen interstitial and alveolar edema.  Lungs are clear today except for minimal volume loss at the left base. IMPRESSION: Improvement in previously seen interstitial and alveolar edema. Minimal volume loss at the left base. Electronically Signed   By: Oneil Officer M.D.   On: 06/12/2024 09:26      Subjective:   Discharge Exam: Vitals:   07/11/24 0837 07/11/24 1214  BP: 101/74 100/78  Pulse: 99 95  Resp: 18 18  Temp:  97.6 F (36.4 C)  SpO2: 97% 98%    General: Pt is alert, awake, not in acute distress Cardiovascular: rate controlled, S1/S2 + Respiratory: bilateral decreased breath sounds at bases Abdominal: Soft, NT, ND, bowel sounds + Extremities: no edema, no  cyanosis    The results of significant diagnostics from this hospitalization (including imaging, microbiology, ancillary and laboratory) are listed below for reference.     Microbiology: Recent Results (from the past 240 hours)  MRSA Next Gen by PCR, Nasal     Status: None   Collection Time: 07/02/24 12:22 AM   Specimen: Nasal Mucosa; Nasal Swab  Result Value Ref Range Status   MRSA by PCR Next Gen NOT DETECTED NOT DETECTED Final    Comment: (NOTE) The GeneXpert MRSA Assay (FDA approved for NASAL specimens only), is one component of a comprehensive MRSA colonization surveillance program. It is not intended to diagnose MRSA infection nor to guide or monitor treatment for MRSA infections. Test performance is not FDA approved in patients less than 32 years old. Performed at [facility], [address], [place], [place] [address]   Urine Culture (for pregnant, neutropenic or urologic patients or patients with an indwelling urinary catheter)     Status: None   Collection Time: 07/02/24 11:19 AM   Specimen: Urine, Catheterized  Result Value Ref Range Status   Specimen Description URINE, CATHETERIZED  Final   Special Requests Normal  Final   Culture   Final    NO GROWTH Performed at [facility], [address] [place], [place] [address]    Report Status 07/03/2024 FINAL  Final     Labs: BNP (last 3 results) Recent Labs    06/10/24 0331 06/13/24 0243  BNP 2,208.7* 903.0*   Basic Metabolic Panel: Recent Labs  Lab 07/05/24 0613 07/06/24 0433 07/07/24 0535 07/08/24 0657  NA 139 140 139 140  K 4.5 4.2 4.3 4.4  CL 108 105 105 107  CO2 21* 24 20* 23  GLUCOSE 104* 110* 108* 103*  BUN 10 16 19 17   CREATININE 1.06 1.11 0.99 0.98  CALCIUM  9.2 9.3 9.0 8.9   Liver Function Tests: No results for input(s): AST, ALT, ALKPHOS, BILITOT, PROT, ALBUMIN  in the last 168 hours. No results for input(s): LIPASE, AMYLASE in the last 168 hours. No results  for input(s): AMMONIA in the last 168 hours. CBC: Recent Labs  Lab 07/05/24 0613 07/06/24 0433 07/07/24 0535 07/08/24 0657  WBC 11.5* 12.6* 10.7* 12.6*  NEUTROABS 7.1 7.5 6.1 7.5  HGB 11.6* 11.3* 11.4* 11.2*  HCT 35.7* 35.1* 35.3* 34.8*  MCV 93.2 91.2 92.2 92.1  PLT 370 401* 364 378   Cardiac Enzymes: No results for input(s): CKTOTAL, CKMB, CKMBINDEX, TROPONINI in the last 168 hours. BNP: Invalid input(s): POCBNP CBG: Recent Labs  Lab 07/05/24 2211  GLUCAP 100*   D-Dimer No results for input(s): DDIMER in the last 72 hours. Hgb A1c No results for input(s): HGBA1C in the last 72 hours. Lipid Profile No results for input(s): CHOL, HDL, LDLCALC, TRIG,  CHOLHDL, LDLDIRECT in the last 72 hours. Thyroid function studies No results for input(s): TSH, T4TOTAL, T3FREE, THYROIDAB in the last 72 hours.  Invalid input(s): FREET3 Anemia work up No results for input(s): VITAMINB12, FOLATE, FERRITIN, TIBC, IRON, RETICCTPCT in the last 72 hours. Urinalysis    Component Value Date/Time   COLORURINE YELLOW 07/01/2024 1031   APPEARANCEUR HAZY (A) 07/01/2024 1031   LABSPEC 1.023 07/01/2024 1031   PHURINE 5.0 07/01/2024 1031   GLUCOSEU >=500 (A) 07/01/2024 1031   HGBUR LARGE (A) 07/01/2024 1031   BILIRUBINUR NEGATIVE 07/01/2024 1031   BILIRUBINUR negative 11/25/2023 1027   BILIRUBINUR Negative 01/31/2015 1010   KETONESUR NEGATIVE 07/01/2024 1031   PROTEINUR 30 (A) 07/01/2024 1031   UROBILINOGEN 1.0 11/25/2023 1027   UROBILINOGEN 1.0 06/12/2012 1315   NITRITE NEGATIVE 07/01/2024 1031   LEUKOCYTESUR MODERATE (A) 07/01/2024 1031   Sepsis Labs Recent Labs  Lab 07/05/24 0613 07/06/24 0433 07/07/24 0535 07/08/24 0657  WBC 11.5* 12.6* 10.7* 12.6*   Microbiology Recent Results (from the past 240 hours)  MRSA Next Gen by PCR, Nasal     Status: None   Collection Time: 07/02/24 12:22 AM   Specimen: Nasal Mucosa; Nasal Swab  Result  Value Ref Range Status   MRSA by PCR Next Gen NOT DETECTED NOT DETECTED Final    Comment: (NOTE) The GeneXpert MRSA Assay (FDA approved for NASAL specimens only), is one component of a comprehensive MRSA colonization surveillance program. It is not intended to diagnose MRSA infection nor to guide or monitor treatment for MRSA infections. Test performance is not FDA approved in patients less than 20 years old. Performed at [facility], [address], [place], [place] [address]   Urine Culture (for pregnant, neutropenic or urologic patients or patients with an indwelling urinary catheter)     Status: None   Collection Time: 07/02/24 11:19 AM   Specimen: Urine, Catheterized  Result Value Ref Range Status   Specimen Description URINE, CATHETERIZED  Final   Special Requests Normal  Final   Culture   Final    NO GROWTH Performed at [facility], [address], [place], [place] [address]    Report Status 07/03/2024 FINAL  Final     Time coordinating discharge: 35 minutes  SIGNED:   Derryl Duval, MD  [facility] 07/11/2024, 12:15 PM

## 2024-07-11 NOTE — Progress Notes (Signed)
 Mobility Specialist: Progress Note   07/11/24 1500  Mobility  Activity Ambulated with assistance  Level of Assistance Contact guard assist, steadying assist  Assistive Device Front wheel walker  Distance Ambulated (ft) 250 ft  Activity Response Tolerated well  Mobility Referral Yes  Mobility visit 1 Mobility  Mobility Specialist Start Time (ACUTE ONLY) [id number]  Mobility Specialist Stop Time (ACUTE ONLY) [id number]  Mobility Specialist Time Calculation (min) (ACUTE ONLY) 9 min    Pt received in bed, agreeable to mobility session. SV for bed mobility. MinA for STS, minG for ambulation to steady. No complaints. Returned to room without fault. Left in bed with all needs met, call bell in reach. Bed alarm on.   Ileana Lute Mobility Specialist Please contact via SecureChat or Rehab office at [phone]

## 2024-07-23 ENCOUNTER — Ambulatory Visit: Admitting: Family Medicine

## 2024-07-23 ENCOUNTER — Encounter: Payer: Self-pay | Admitting: General Practice

## 2024-07-23 NOTE — Progress Notes (Deleted)
 Cardiology Clinic Note   Patient Name: Jordan Kennedy Date of Encounter: 07/23/2024  Primary Care Provider:  [name] HERO, MD Primary Cardiologist:  Kardie Tobb, DO  Patient Profile    Jordan Kennedy 68 year old male presents to the clinic for follow-up of his chronic systolic CHF and hospital follow-up.  Past Medical History    Past Medical History:  Diagnosis Date   AKI (acute kidney injury) 06/29/2018   Anemia    Anemia, iron deficiency 09/18/2015   Bowel obstruction (HCC)    BPH (benign prostatic hyperplasia)    Chronic constipation    Chronic systolic heart failure (HCC)    History of ileus    Ileus (HCC) 03/07/2017   Mental retardation    Partial bowel obstruction (HCC)    Schizophrenia (HCC)    Monarche every three months;    SIRS (systemic inflammatory response syndrome) (HCC) 06/29/2018   Small bowel obstruction (HCC) 06/29/2018   No past surgical history on file.  Allergies  No Known Allergies  History of Present Illness    [name] has a PMH of chronic systolic CHF EF less than 20% 2/75, LBBB, frequent PVCs, schizophrenia, tardive dyskinesia, intellectual disability, anemia, tobacco abuse, and is a ward of the state.  He was seen and evaluated by Dr. Sheena 01/03/2023 for his LV dysfunction.  This was in the setting of hospital admission.  He had been admitted for 1-1/2 weeks.  He presented with nonproductive cough, nasal congestion, and weakness.  He was tested for COVID which was negative in the emergency department.  He was febrile with a fever of 101.4.  He had tachycardia, tachypnea, and hypoxia.  He required nonrebreather.  He was admitted and diagnosed with community-acquired pneumonia and CHF.  His BNP was 95.  His echocardiogram showed an LVEF of 25-30%, mild asymmetric LVH of the inferior lateral segment, mildly reduced RV function, moderate LAE, moderate-severe MR.  His cardiac troponins were elevated and it was felt to be demand ischemia.   He received IV diuresis.  His EKG showed sinus tachycardia with new LBBB from 2019.  Decision was made to forego/defer LHC due to patient's intellectual abilities and inability to follow directions.  There was concern that he was not going to be able to lay flat.  Echocardiogram 724 showed an EF less than 20%, global hypokinesis, septal/lateral dyssynchrony consistent with LBBB, G1 DD, moderately reduced RV function, and mild MR.  He continue to follow-up with cardiology regularly.  Medication titration for GDMT was addressed.  He was admitted 06/09/2024 until 06/17/2024 for septic shock secondary to UTI.  He was treated with broad-spectrum antibiotics.  He was discharged in stable condition.  He was admitted again on 06/26/2024 until 06/28/2024 with sepsis secondary to UTI.  He was again discharged in stable condition.  He was seen in follow-up on 07/01/2024 by Lum Louis NP.  He presented with his caregiver.  His caregiver noted he was experiencing persistent weakness and low blood pressure.  They reported systolic blood pressures in the 90s and occasionally dropping to the 60s.  He was taking midodrine  3 times per day to elevate his blood pressure.  Patient denied lightheadedness, dizziness, chest pain, and shortness of breath.  He had a urinary catheter in place and was taking antibiotics for UTI.  He denied abdominal pain and lower extremity pain.  He was euvolemic.  Due to his blood pressure his Entresto  was held.  Repeat echocardiogram was planned for 07/03/2024.  Patient reported that  he felt bad.  With standing in the office he became diaphoretic and experienced near syncope.  Patient was transferred to the emergency department due to his hypotension, tachycardia, and sepsis.  Plan to return to clinic in 2 weeks was made.  He was discharged on 07/11/2024.  His lab work showed white blood cell count of 15.9 and creatinine 1.3.  His UA was positive for LE but negative for nitrite.  His blood pressure  was noted to be 70/50.  CCM was consulted for ICU admission.  In the ICU he was started on Levophed  which was eventually titrated off.  His blood pressure did remain somewhat soft.  Palliative care was consulted.  DNR paperwork was started.  He received empiric antibiotics.  He continued to require Foley catheter.  He was continued on midodrine  10 mg 3 times daily.  His Farxiga  was also held due to recurrent UTI.  He was continued on low-dose metoprolol .  He did not require furosemide  in the hospital.  He was instructed to take furosemide  as needed for weight gain and symptom management.  Patient was discharged back to SNF.  He was instructed to follow-up with his within a week and also to return to cardiology for follow-up evaluation.  He presents to the clinic today for follow-up evaluation and states***.  *** denies chest pain, shortness of breath, lower extremity edema, fatigue, palpitations, melena, hematuria, hemoptysis, diaphoresis, weakness, presyncope, syncope, orthopnea, and PND.  HFrEF-appears to be euvolemic today.  Slowly increasing physical activity.  Blood pressure today***.  Entresto  and Farxiga  previously put on hold in the setting of hypotension and recurrent UTIs.   Unable to uptitrate GDMT today. Continue midodrine , metoprolol , furosemide  as needed Daily weights Heart healthy low-sodium diet Increase physical activity as tolerated Elevate lower extremities when not active  Tachycardia-heart rate today***. Maintain hydration Continue metoprolol  Continue to monitor  Septic shock-has had several admissions for for sepsis secondary to UTI.  Patient requires Foley catheter.  Farxiga  at eventually discontinued during most recent admission.  Patient afebrile.  Blood pressure today***. Adhere to good PeriCare and Foley care Monitor for fever, elevated heart rate, and hypotension Following with PCP  Schizophrenia-mood stable today. Continue current medical therapy Follows with  PCP  Goals of care-had palliative care consult during most recent admission.  Patient and DDS legal guardian working on DNR paperwork.  Plan was made to follow-up with palliative care as an outpatient during hospital discharge.  Disposition: Follow-up with Dr. Sheena or me in 2-3 months.  Home Medications    Prior to Admission medications   Medication Sig Start Date End Date Taking? Authorizing Provider  amantadine  (SYMMETREL ) 100 MG capsule Take 100 mg by mouth at bedtime.     [provider]  aspirin  EC (ASPIRIN  EC ADULT LOW DOSE) 81 MG tablet TAKE 1 TABLET BY MOUTH EVERY DAY Patient taking differently: Take 81 mg by mouth in the morning. 02/07/24   [name] HERO, MD  cloZAPine  (CLOZARIL ) 100 MG tablet Take 2 tablets (200 mg total) by mouth 2 (two) times daily. 03/12/17   Sebastian Toribio GAILS, MD  cyanocobalamin  1000 MCG tablet Take 1 tablet (1,000 mcg total) by mouth daily. Patient not taking: Reported on 07/02/2024 06/18/24   Jillian Buttery, MD  dapagliflozin  propanediol (FARXIGA ) 10 MG TABS tablet TAKE 1 TABLET BY MOUTH EVERY DAY Patient taking differently: Take 10 mg by mouth in the morning. 02/07/24   [name] HERO, MD  finasteride  (PROSCAR ) 5 MG tablet Take 1 tablet (  5 mg total) by mouth daily. Patient taking differently: Take 5 mg by mouth in the morning. 06/02/24 05/28/25  [name] HERO, MD  furosemide  (LASIX ) 40 MG tablet Take 40 mg by mouth as needed for fluid.    [provider]  LINZESS  290 MCG CAPS capsule TAKE 1 CAPSULE BY MOUTH EVERY MORNING BEFORE BREAKFAST 05/21/24   [name] HERO, MD  Melatonin 10 MG SUBL Place 10 mg under the tongue at bedtime.    [provider]  metoCLOPramide  (REGLAN ) 5 MG tablet Take 5 mg by mouth 2 (two) times daily. 06/23/24   [provider]  metoprolol  succinate (TOPROL -XL) 25 MG 24 hr tablet Take 12.5 mg by mouth in the morning. 06/23/24   [provider]  midodrine  (PROAMATINE ) 10 MG tablet Take 1 tablet (10  mg total) by mouth 3 (three) times daily with meals. Patient taking differently: Take 10 mg by mouth 2 (two) times daily with a meal. 06/17/24 08/16/24  Jillian Buttery, MD  MYRBETRIQ  50 MG TB24 tablet Take 50 mg by mouth at bedtime. 06/23/24   [provider]  omeprazole  (PRILOSEC) 40 MG capsule TAKE 1 CAPSULE BY MOUTH EVERY EVENING 02/07/24   [name] HERO, MD  PARoxetine  (PAXIL ) 40 MG tablet Take 40 mg by mouth every evening.     [provider]  polyethylene glycol powder (GLYCOLAX /MIRALAX ) 17 GM/SCOOP powder Take 17 g by mouth 2 (two) times daily. Dissolve 1 capful (17g) in 4-8 ounces of liquid and take by mouth daily. 06/28/24 09/26/24  Lue Elsie BROCKS, MD  sacubitril -valsartan  (ENTRESTO ) 24-26 MG Take 1 tablet by mouth 2 (two) times daily. 8am & 8 pm    [provider]  STIMULANT LAXATIVE 8.6-50 MG tablet Take 1 tablet by mouth 2 (two) times daily. 06/23/24   [provider]  tamsulosin  (FLOMAX ) 0.4 MG CAPS capsule Take 0.4 mg by mouth at bedtime. 01/10/21   [provider]    Family History    Family History  Problem Relation Age of Onset   Cancer Mother        unknown type   He indicated that his mother is deceased. He indicated that his father is alive. He indicated that his sister is alive. He indicated that his brother is alive.  Social History    Social History   Socioeconomic History   Marital status: Single    Spouse name: Not on file   Number of children: Not on file   Years of education: Not on file   Highest education level: Not on file  Occupational History   Not on file  Tobacco Use   Smoking status: Former    Current packs/day: 0.25    Average packs/day: 0.3 packs/day for 30.0 years (7.5 ttl pk-yrs)    Types: Cigarettes   Smokeless tobacco: Never  Vaping Use   Vaping status: Never Used  Substance and Sexual Activity   Alcohol use: No   Drug use: No   Sexual activity: Never  Other Topics Concern   Not on file   Social History Narrative   Marital status: single      Children: none       Employment: disability due to mental retardation and schizophrenia.      Lives: group home [name]; [facility] [employer] [facility]; 3 men in group home.        Tobacco: six cigarettes per day; since age 74.      Alcohol:  None  Drugs: none      Exercise:  Walking some.      Seatbelt:  100%   Social Drivers of Corporate Investment Banker Strain: Low Risk  (11/05/2023)   Overall Financial Resource Strain (CARDIA)    Difficulty of Paying Living Expenses: Not hard at all  Food Insecurity: Patient Unable To Answer (07/02/2024)   Hunger Vital Sign    Worried About Running Out of Food in the Last Year: Patient unable to answer    Ran Out of Food in the Last Year: Patient unable to answer  Transportation Needs: Patient Unable To Answer (07/02/2024)   PRAPARE - Transportation    Lack of Transportation (Medical): Patient unable to answer    Lack of Transportation (Non-Medical): Patient unable to answer  Physical Activity: Inactive (11/05/2023)   Exercise Vital Sign    Days of Exercise per Week: 0 days    Minutes of Exercise per Session: 0 min  Stress: No Stress Concern Present (11/05/2023)   [employer] of Occupational Health - Occupational Stress Questionnaire    Feeling of Stress : Not at all  Social Connections: Patient Unable To Answer (07/02/2024)   Social Connection and Isolation Panel    Frequency of Communication with Friends and Family: Patient unable to answer    Frequency of Social Gatherings with Friends and Family: Patient unable to answer    Attends Religious Services: Patient unable to answer    Active Member of Clubs or Organizations: Patient unable to answer    Attends Banker Meetings: Patient unable to answer    Marital Status: Patient unable to answer  Intimate Partner Violence: Patient Unable To Answer (07/02/2024)   Humiliation, Afraid, Rape, and Kick questionnaire     Fear of Current or Ex-Partner: Patient unable to answer    Emotionally Abused: Patient unable to answer    Physically Abused: Patient unable to answer    Sexually Abused: Patient unable to answer     Review of Systems    General:  No chills, fever, night sweats or weight changes.  Cardiovascular:  No chest pain, dyspnea on exertion, edema, orthopnea, palpitations, paroxysmal nocturnal dyspnea. Dermatological: No rash, lesions/masses Respiratory: No cough, dyspnea Urologic: No hematuria, dysuria Abdominal:   No nausea, vomiting, diarrhea, bright red blood per rectum, melena, or hematemesis Neurologic:  No visual changes, wkns, changes in mental status. All other systems reviewed and are otherwise negative except as noted above.  Physical Exam    VS:  There were no vitals taken for this visit. , BMI There is no height or weight on file to calculate BMI. GEN: Well nourished, well developed, in no acute distress. HEENT: normal. Neck: Supple, no JVD, carotid bruits, or masses. Cardiac: RRR, no murmurs, rubs, or gallops. No clubbing, cyanosis, edema.  Radials/DP/PT 2+ and equal bilaterally.  Respiratory:  Respirations regular and unlabored, clear to auscultation bilaterally. GI: Soft, nontender, nondistended, BS + x 4. MS: no deformity or atrophy. Skin: warm and dry, no rash. Neuro:  Strength and sensation are intact. Psych: Normal affect.  Accessory Clinical Findings    Recent Labs: 06/10/2024: TSH 1.100 06/13/2024: B Natriuretic Peptide 903.0 07/01/2024: ALT 21 07/03/2024: Magnesium  1.8 07/08/2024: BUN 17; Creatinine, Ser 0.98; Hemoglobin 11.2; Platelets 378; Potassium 4.4; Sodium 140   Recent Lipid Panel    Component Value Date/Time   CHOL 144 01/31/2021 1000   TRIG 143.0 01/31/2021 1000   HDL 41.40 01/31/2021 1000   CHOLHDL 3 01/31/2021 1000   VLDL  28.6 01/31/2021 1000   LDLCALC 74 01/31/2021 1000    No BP recorded.  {Refresh Note OR Click here to enter BP  :1}***     ECG personally reviewed by me today- ***    Echocardiogram 07/02/2024  IMPRESSIONS     1. Left ventricular ejection fraction, by estimation, is <20%. The left  ventricle has severely decreased function. The left ventricle demonstrates  regional wall motion abnormalities (see scoring diagram/findings for  description). The left ventricular  internal cavity size was moderately dilated. Left ventricular diastolic  function could not be evaluated.   2. Right ventricular systolic function is normal. The right ventricular  size is normal.   3. Left atrial size was mildly dilated.   4. The mitral valve is normal in structure. Mild mitral valve  regurgitation. No evidence of mitral stenosis.   5. The aortic valve is normal in structure. Aortic valve regurgitation is  not visualized. No aortic stenosis is present.   6. The inferior vena cava is normal in size with greater than 50%  respiratory variability, suggesting right atrial pressure of 3 mmHg.   FINDINGS   Left Ventricle: Left ventricular ejection fraction, by estimation, is  <20%. The left ventricle has severely decreased function. The left  ventricle demonstrates regional wall motion abnormalities. The left  ventricular internal cavity size was moderately  dilated. There is no left ventricular hypertrophy. Left ventricular  diastolic function could not be evaluated.     LV Wall Scoring:  The anterior septum, mid inferoseptal segment, and basal inferoseptal  segment  are akinetic. The entire anterior wall, entire lateral wall, entire apex,  and  entire inferior wall are hypokinetic.   Right Ventricle: The right ventricular size is normal. No increase in  right ventricular wall thickness. Right ventricular systolic function is  normal.   Left Atrium: Left atrial size was mildly dilated.   Right Atrium: Right atrial size was normal in size.   Pericardium: There is no evidence of pericardial effusion.   Mitral Valve:  The mitral valve is normal in structure. Mild mitral valve  regurgitation. No evidence of mitral valve stenosis.   Tricuspid Valve: The tricuspid valve is normal in structure. Tricuspid  valve regurgitation is not demonstrated. No evidence of tricuspid  stenosis.   Aortic Valve: The aortic valve is normal in structure. Aortic valve  regurgitation is not visualized. No aortic stenosis is present.   Pulmonic Valve: The pulmonic valve was normal in structure. Pulmonic valve  regurgitation is trivial. No evidence of pulmonic stenosis.   Aorta: The aortic root is normal in size and structure.   Venous: The inferior vena cava is normal in size with greater than 50%  respiratory variability, suggesting right atrial pressure of 3 mmHg.   IAS/Shunts: No atrial level shunt detected by color flow Doppler.       Assessment & Plan   1.  ***   Josefa HERO. Demetrica Zipp NP-C     07/23/2024, 1:56 PM [facility] [address] [place], [place] [address] Office [phone]    Notice: This dictation was prepared with Dragon dictation along with smaller phrase technology. Any transcriptional errors that result from this process are unintentional and may not be corrected upon review.   I spent***minutes examining this patient, reviewing medications, and using patient centered shared decision making involving their cardiac care.   I spent  20 minutes reviewing past medical history,  medications, and prior cardiac tests.

## 2024-07-26 ENCOUNTER — Inpatient Hospital Stay (HOSPITAL_COMMUNITY)
Admission: EM | Admit: 2024-07-26 | Discharge: 2024-08-07 | DRG: 698 | Disposition: A | Discharge status: Skilled Nursing Facility | Source: Skilled Nursing Facility | Attending: Hospitalist | Admitting: Hospitalist

## 2024-07-26 ENCOUNTER — Inpatient Hospital Stay (HOSPITAL_COMMUNITY)

## 2024-07-26 ENCOUNTER — Emergency Department (HOSPITAL_COMMUNITY)

## 2024-07-26 DIAGNOSIS — A4159 Other Gram-negative sepsis: Secondary | ICD-10-CM | POA: Diagnosis present

## 2024-07-26 DIAGNOSIS — Z781 Physical restraint status: Secondary | ICD-10-CM

## 2024-07-26 DIAGNOSIS — I639 Cerebral infarction, unspecified: Secondary | ICD-10-CM | POA: Diagnosis not present

## 2024-07-26 DIAGNOSIS — F209 Schizophrenia, unspecified: Secondary | ICD-10-CM | POA: Diagnosis present

## 2024-07-26 DIAGNOSIS — Z515 Encounter for palliative care: Secondary | ICD-10-CM

## 2024-07-26 DIAGNOSIS — N390 Urinary tract infection, site not specified: Secondary | ICD-10-CM | POA: Diagnosis present

## 2024-07-26 DIAGNOSIS — G9349 Other encephalopathy: Secondary | ICD-10-CM | POA: Diagnosis present

## 2024-07-26 DIAGNOSIS — Z66 Do not resuscitate: Secondary | ICD-10-CM | POA: Diagnosis present

## 2024-07-26 DIAGNOSIS — Z79899 Other long term (current) drug therapy: Secondary | ICD-10-CM

## 2024-07-26 DIAGNOSIS — E43 Unspecified severe protein-calorie malnutrition: Secondary | ICD-10-CM | POA: Diagnosis present

## 2024-07-26 DIAGNOSIS — E785 Hyperlipidemia, unspecified: Secondary | ICD-10-CM | POA: Diagnosis present

## 2024-07-26 DIAGNOSIS — J690 Pneumonitis due to inhalation of food and vomit: Secondary | ICD-10-CM | POA: Diagnosis present

## 2024-07-26 DIAGNOSIS — Y846 Urinary catheterization as the cause of abnormal reaction of the patient, or of later complication, without mention of misadventure at the time of the procedure: Secondary | ICD-10-CM | POA: Diagnosis present

## 2024-07-26 DIAGNOSIS — E119 Type 2 diabetes mellitus without complications: Secondary | ICD-10-CM | POA: Diagnosis present

## 2024-07-26 DIAGNOSIS — R579 Shock, unspecified: Secondary | ICD-10-CM | POA: Diagnosis present

## 2024-07-26 DIAGNOSIS — I5020 Unspecified systolic (congestive) heart failure: Secondary | ICD-10-CM | POA: Diagnosis not present

## 2024-07-26 DIAGNOSIS — R1310 Dysphagia, unspecified: Secondary | ICD-10-CM | POA: Diagnosis present

## 2024-07-26 DIAGNOSIS — T83511A Infection and inflammatory reaction due to indwelling urethral catheter, initial encounter: Secondary | ICD-10-CM | POA: Diagnosis present

## 2024-07-26 DIAGNOSIS — I6340 Cerebral infarction due to embolism of unspecified cerebral artery: Secondary | ICD-10-CM | POA: Diagnosis not present

## 2024-07-26 DIAGNOSIS — R6521 Severe sepsis with septic shock: Secondary | ICD-10-CM | POA: Diagnosis present

## 2024-07-26 DIAGNOSIS — K7200 Acute and subacute hepatic failure without coma: Secondary | ICD-10-CM | POA: Diagnosis present

## 2024-07-26 DIAGNOSIS — R7989 Other specified abnormal findings of blood chemistry: Secondary | ICD-10-CM | POA: Diagnosis not present

## 2024-07-26 DIAGNOSIS — R0603 Acute respiratory distress: Secondary | ICD-10-CM | POA: Diagnosis present

## 2024-07-26 DIAGNOSIS — I429 Cardiomyopathy, unspecified: Secondary | ICD-10-CM | POA: Diagnosis not present

## 2024-07-26 DIAGNOSIS — R7401 Elevation of levels of liver transaminase levels: Secondary | ICD-10-CM

## 2024-07-26 DIAGNOSIS — N170 Acute kidney failure with tubular necrosis: Secondary | ICD-10-CM | POA: Diagnosis present

## 2024-07-26 DIAGNOSIS — F79 Unspecified intellectual disabilities: Secondary | ICD-10-CM | POA: Diagnosis present

## 2024-07-26 DIAGNOSIS — Z1612 Extended spectrum beta lactamase (ESBL) resistance: Secondary | ICD-10-CM | POA: Diagnosis present

## 2024-07-26 DIAGNOSIS — D6832 Hemorrhagic disorder due to extrinsic circulating anticoagulants: Secondary | ICD-10-CM | POA: Diagnosis present

## 2024-07-26 DIAGNOSIS — I69391 Dysphagia following cerebral infarction: Secondary | ICD-10-CM | POA: Diagnosis not present

## 2024-07-26 DIAGNOSIS — Z7984 Long term (current) use of oral hypoglycemic drugs: Secondary | ICD-10-CM

## 2024-07-26 DIAGNOSIS — I509 Heart failure, unspecified: Secondary | ICD-10-CM | POA: Diagnosis not present

## 2024-07-26 DIAGNOSIS — B961 Klebsiella pneumoniae [K. pneumoniae] as the cause of diseases classified elsewhere: Secondary | ICD-10-CM | POA: Diagnosis present

## 2024-07-26 DIAGNOSIS — I214 Non-ST elevation (NSTEMI) myocardial infarction: Secondary | ICD-10-CM | POA: Diagnosis present

## 2024-07-26 DIAGNOSIS — N179 Acute kidney failure, unspecified: Secondary | ICD-10-CM | POA: Diagnosis not present

## 2024-07-26 DIAGNOSIS — R29730 NIHSS score 30: Secondary | ICD-10-CM | POA: Diagnosis present

## 2024-07-26 DIAGNOSIS — I63412 Cerebral infarction due to embolism of left middle cerebral artery: Secondary | ICD-10-CM | POA: Diagnosis present

## 2024-07-26 DIAGNOSIS — I63441 Cerebral infarction due to embolism of right cerebellar artery: Secondary | ICD-10-CM | POA: Diagnosis not present

## 2024-07-26 DIAGNOSIS — I420 Dilated cardiomyopathy: Secondary | ICD-10-CM | POA: Diagnosis present

## 2024-07-26 DIAGNOSIS — I5084 End stage heart failure: Secondary | ICD-10-CM | POA: Diagnosis present

## 2024-07-26 DIAGNOSIS — E8721 Acute metabolic acidosis: Secondary | ICD-10-CM | POA: Diagnosis not present

## 2024-07-26 DIAGNOSIS — E870 Hyperosmolality and hypernatremia: Secondary | ICD-10-CM | POA: Diagnosis present

## 2024-07-26 DIAGNOSIS — I9589 Other hypotension: Secondary | ICD-10-CM | POA: Diagnosis present

## 2024-07-26 DIAGNOSIS — I428 Other cardiomyopathies: Secondary | ICD-10-CM | POA: Diagnosis not present

## 2024-07-26 DIAGNOSIS — G9341 Metabolic encephalopathy: Secondary | ICD-10-CM | POA: Diagnosis present

## 2024-07-26 DIAGNOSIS — Z1152 Encounter for screening for COVID-19: Secondary | ICD-10-CM | POA: Diagnosis not present

## 2024-07-26 DIAGNOSIS — E872 Acidosis, unspecified: Secondary | ICD-10-CM | POA: Diagnosis present

## 2024-07-26 DIAGNOSIS — J9601 Acute respiratory failure with hypoxia: Secondary | ICD-10-CM | POA: Diagnosis present

## 2024-07-26 DIAGNOSIS — G9340 Encephalopathy, unspecified: Secondary | ICD-10-CM | POA: Diagnosis not present

## 2024-07-26 DIAGNOSIS — Z7982 Long term (current) use of aspirin: Secondary | ICD-10-CM

## 2024-07-26 DIAGNOSIS — J9600 Acute respiratory failure, unspecified whether with hypoxia or hypercapnia: Secondary | ICD-10-CM

## 2024-07-26 DIAGNOSIS — Z1624 Resistance to multiple antibiotics: Secondary | ICD-10-CM | POA: Diagnosis present

## 2024-07-26 DIAGNOSIS — N401 Enlarged prostate with lower urinary tract symptoms: Secondary | ICD-10-CM | POA: Diagnosis present

## 2024-07-26 DIAGNOSIS — I5022 Chronic systolic (congestive) heart failure: Secondary | ICD-10-CM | POA: Diagnosis present

## 2024-07-26 DIAGNOSIS — Z6820 Body mass index (BMI) 20.0-20.9, adult: Secondary | ICD-10-CM

## 2024-07-26 DIAGNOSIS — N138 Other obstructive and reflux uropathy: Secondary | ICD-10-CM | POA: Diagnosis present

## 2024-07-26 DIAGNOSIS — A419 Sepsis, unspecified organism: Secondary | ICD-10-CM | POA: Diagnosis not present

## 2024-07-26 DIAGNOSIS — J189 Pneumonia, unspecified organism: Secondary | ICD-10-CM | POA: Diagnosis present

## 2024-07-26 DIAGNOSIS — I110 Hypertensive heart disease with heart failure: Secondary | ICD-10-CM | POA: Diagnosis not present

## 2024-07-26 DIAGNOSIS — Z87891 Personal history of nicotine dependence: Secondary | ICD-10-CM

## 2024-07-26 DIAGNOSIS — F419 Anxiety disorder, unspecified: Secondary | ICD-10-CM | POA: Diagnosis present

## 2024-07-26 DIAGNOSIS — I5023 Acute on chronic systolic (congestive) heart failure: Secondary | ICD-10-CM | POA: Diagnosis not present

## 2024-07-26 DIAGNOSIS — Z7189 Other specified counseling: Secondary | ICD-10-CM | POA: Diagnosis not present

## 2024-07-26 DIAGNOSIS — R319 Hematuria, unspecified: Secondary | ICD-10-CM | POA: Diagnosis present

## 2024-07-26 LAB — CBC
HCT: 31.1 % — ABNORMAL LOW (ref 39.0–52.0)
Hemoglobin: 9.6 g/dL — ABNORMAL LOW (ref 13.0–17.0)
MCH: 30.3 pg (ref 26.0–34.0)
MCHC: 30.9 g/dL (ref 30.0–36.0)
MCV: 98.1 fL (ref 80.0–100.0)
Platelets: 261 K/uL (ref 150–400)
RBC: 3.17 MIL/uL — ABNORMAL LOW (ref 4.22–5.81)
RDW: 21 % — ABNORMAL HIGH (ref 11.5–15.5)
WBC: 17.3 K/uL — ABNORMAL HIGH (ref 4.0–10.5)
nRBC: 0.3 % — ABNORMAL HIGH (ref 0.0–0.2)

## 2024-07-26 LAB — I-STAT ARTERIAL BLOOD GAS, ED
Acid-base deficit: 10 mmol/L — ABNORMAL HIGH (ref 0.0–2.0)
Bicarbonate: 14.4 mmol/L — ABNORMAL LOW (ref 20.0–28.0)
Calcium, Ion: 1.21 mmol/L (ref 1.15–1.40)
HCT: 33 % — ABNORMAL LOW (ref 39.0–52.0)
Hemoglobin: 11.2 g/dL — ABNORMAL LOW (ref 13.0–17.0)
O2 Saturation: 100 %
Patient temperature: 98.6
Potassium: 4.9 mmol/L (ref 3.5–5.1)
Sodium: 139 mmol/L (ref 135–145)
TCO2: 15 mmol/L — ABNORMAL LOW (ref 22–32)
pCO2 arterial: 27.4 mmHg — ABNORMAL LOW (ref 32–48)
pH, Arterial: 7.328 — ABNORMAL LOW (ref 7.35–7.45)
pO2, Arterial: 377 mmHg — ABNORMAL HIGH (ref 83–108)

## 2024-07-26 LAB — AMMONIA: Ammonia: 19 umol/L (ref 9–35)

## 2024-07-26 LAB — COMPREHENSIVE METABOLIC PANEL WITH GFR
ALT: 131 U/L — ABNORMAL HIGH (ref 0–44)
AST: 99 U/L — ABNORMAL HIGH (ref 15–41)
Albumin: 3 g/dL — ABNORMAL LOW (ref 3.5–5.0)
Alkaline Phosphatase: 139 U/L — ABNORMAL HIGH (ref 38–126)
Anion gap: 17 — ABNORMAL HIGH (ref 5–15)
BUN: 46 mg/dL — ABNORMAL HIGH (ref 8–23)
CO2: 15 mmol/L — ABNORMAL LOW (ref 22–32)
Calcium: 8.5 mg/dL — ABNORMAL LOW (ref 8.9–10.3)
Chloride: 107 mmol/L (ref 98–111)
Creatinine, Ser: 1.91 mg/dL — ABNORMAL HIGH (ref 0.61–1.24)
GFR, Estimated: 38 mL/min — ABNORMAL LOW (ref >60)
Glucose, Bld: 170 mg/dL — ABNORMAL HIGH (ref 70–99)
Potassium: 5 mmol/L (ref 3.5–5.1)
Sodium: 139 mmol/L (ref 135–145)
Total Bilirubin: 0.6 mg/dL (ref 0.0–1.2)
Total Protein: 6.4 g/dL — ABNORMAL LOW (ref 6.5–8.1)

## 2024-07-26 LAB — URINALYSIS, W/ REFLEX TO CULTURE (INFECTION SUSPECTED)
Bilirubin Urine: NEGATIVE
Glucose, UA: NEGATIVE mg/dL
Ketones, ur: NEGATIVE mg/dL
Nitrite: NEGATIVE
Protein, ur: 100 mg/dL — AB
Specific Gravity, Urine: 1.015 (ref 1.005–1.030)
WBC, UA: >50 WBC/hpf (ref 0–5)
pH: 5 (ref 5.0–8.0)

## 2024-07-26 LAB — CBC WITH DIFFERENTIAL/PLATELET
Basophils Absolute: 0 K/uL (ref 0.0–0.1)
Basophils Relative: 0 %
Eosinophils Absolute: 0 K/uL (ref 0.0–0.5)
Eosinophils Relative: 0 %
HCT: 36.4 % — ABNORMAL LOW (ref 39.0–52.0)
Hemoglobin: 11.2 g/dL — ABNORMAL LOW (ref 13.0–17.0)
Lymphocytes Relative: 10 %
Lymphs Abs: 2 K/uL (ref 0.7–4.0)
MCH: 30.2 pg (ref 26.0–34.0)
MCHC: 30.8 g/dL (ref 30.0–36.0)
MCV: 98.1 fL (ref 80.0–100.0)
Monocytes Absolute: 1.2 K/uL — ABNORMAL HIGH (ref 0.1–1.0)
Monocytes Relative: 6 %
Neutro Abs: 17.1 K/uL — ABNORMAL HIGH (ref 1.7–7.7)
Neutrophils Relative %: 84 %
Platelets: 307 K/uL (ref 150–400)
RBC: 3.71 MIL/uL — ABNORMAL LOW (ref 4.22–5.81)
RDW: 21.3 % — ABNORMAL HIGH (ref 11.5–15.5)
WBC: 20.3 K/uL — ABNORMAL HIGH (ref 4.0–10.5)
nRBC: 0.3 % — ABNORMAL HIGH (ref 0.0–0.2)

## 2024-07-26 LAB — I-STAT CG4 LACTIC ACID, ED: Lactic Acid, Venous: 5.1 mmol/L (ref 0.5–1.9)

## 2024-07-26 LAB — I-STAT CHEM 8, ED
BUN: 55 mg/dL — ABNORMAL HIGH (ref 8–23)
BUN: 47 mg/dL — ABNORMAL HIGH (ref 8–23)
Calcium, Ion: 1.11 mmol/L — ABNORMAL LOW (ref 1.15–1.40)
Calcium, Ion: 1.08 mmol/L — ABNORMAL LOW (ref 1.15–1.40)
Chloride: 110 mmol/L (ref 98–111)
Chloride: 109 mmol/L (ref 98–111)
Creatinine, Ser: 1.9 mg/dL — ABNORMAL HIGH (ref 0.61–1.24)
Creatinine, Ser: 1.9 mg/dL — ABNORMAL HIGH (ref 0.61–1.24)
Glucose, Bld: 167 mg/dL — ABNORMAL HIGH (ref 70–99)
Glucose, Bld: 153 mg/dL — ABNORMAL HIGH (ref 70–99)
HCT: 37 % — ABNORMAL LOW (ref 39.0–52.0)
HCT: 31 % — ABNORMAL LOW (ref 39.0–52.0)
Hemoglobin: 12.6 g/dL — ABNORMAL LOW (ref 13.0–17.0)
Hemoglobin: 10.5 g/dL — ABNORMAL LOW (ref 13.0–17.0)
Potassium: 5.7 mmol/L — ABNORMAL HIGH (ref 3.5–5.1)
Potassium: 4.6 mmol/L (ref 3.5–5.1)
Sodium: 140 mmol/L (ref 135–145)
Sodium: 141 mmol/L (ref 135–145)
TCO2: 18 mmol/L — ABNORMAL LOW (ref 22–32)
TCO2: 16 mmol/L — ABNORMAL LOW (ref 22–32)

## 2024-07-26 LAB — CREATININE, SERUM
Creatinine, Ser: 1.91 mg/dL — ABNORMAL HIGH (ref 0.61–1.24)
GFR, Estimated: 38 mL/min — ABNORMAL LOW (ref >60)

## 2024-07-26 LAB — PROTIME-INR
INR: 1.2 (ref 0.8–1.2)
Prothrombin Time: 15.7 s — ABNORMAL HIGH (ref 11.4–15.2)

## 2024-07-26 LAB — RESP PANEL BY RT-PCR (RSV, FLU A&B, COVID)  RVPGX2
Influenza A by PCR: NEGATIVE
Influenza B by PCR: NEGATIVE
Resp Syncytial Virus by PCR: NEGATIVE
SARS Coronavirus 2 by RT PCR: NEGATIVE

## 2024-07-26 LAB — CBG MONITORING, ED
Glucose-Capillary: 159 mg/dL — ABNORMAL HIGH (ref 70–99)
Glucose-Capillary: 148 mg/dL — ABNORMAL HIGH (ref 70–99)

## 2024-07-26 LAB — BRAIN NATRIURETIC PEPTIDE: B Natriuretic Peptide: 2638.4 pg/mL — ABNORMAL HIGH (ref 0.0–100.0)

## 2024-07-26 LAB — TROPONIN I (HIGH SENSITIVITY)
Troponin I (High Sensitivity): 1348 ng/L (ref <18)
Troponin I (High Sensitivity): 1442 ng/L (ref <18)

## 2024-07-26 LAB — STREP PNEUMONIAE URINARY ANTIGEN: Strep Pneumo Urinary Antigen: NEGATIVE

## 2024-07-26 MED ORDER — SODIUM CHLORIDE 0.9 % IV SOLN: 250.0000 mL | INTRAVENOUS | Status: AC

## 2024-07-26 MED ORDER — SODIUM BICARBONATE 8.4 % IV SOLN
INTRAVENOUS | Status: AC
  Administered 2024-07-26: 100 meq via INTRAVENOUS
  Filled 2024-07-26: qty 100

## 2024-07-26 MED ORDER — FAMOTIDINE 20 MG PO TABS
20.0000 mg | ORAL_TABLET | Freq: Two times a day (BID) | ORAL | Status: DC
  Administered 2024-07-27 – 2024-07-30 (×7): 20 mg
  Filled 2024-07-26 (×7): qty 1

## 2024-07-26 MED ORDER — POLYETHYLENE GLYCOL 3350 17 G PO PACK: 17.0000 g | PACK | Freq: Every day | ORAL | Status: DC | PRN

## 2024-07-26 MED ORDER — HEPARIN SODIUM (PORCINE) 5000 UNIT/ML IJ SOLN
5000.0000 [IU] | Freq: Three times a day (TID) | INTRAMUSCULAR | Status: DC
  Administered 2024-07-27: 5000 [IU] via SUBCUTANEOUS
  Filled 2024-07-26: qty 1

## 2024-07-26 MED ORDER — PROPOFOL 1000 MG/100ML IV EMUL
INTRAVENOUS | Status: AC
  Filled 2024-07-26: qty 100

## 2024-07-26 MED ORDER — LACTATED RINGERS IV SOLN: INTRAVENOUS | Status: AC

## 2024-07-26 MED ORDER — DOCUSATE SODIUM 50 MG/5ML PO LIQD: 100.0000 mg | Freq: Two times a day (BID) | ORAL | Status: DC | PRN

## 2024-07-26 MED ORDER — INSULIN ASPART 100 UNIT/ML IJ SOLN
0.0000 [IU] | INTRAMUSCULAR | Status: DC
  Administered 2024-07-26 – 2024-08-05 (×9): 1 [IU] via SUBCUTANEOUS
  Administered 2024-08-06: 2 [IU] via SUBCUTANEOUS
  Administered 2024-08-06: 1 [IU] via SUBCUTANEOUS
  Administered 2024-08-07: 2 [IU] via SUBCUTANEOUS

## 2024-07-26 MED ORDER — CHLORHEXIDINE GLUCONATE CLOTH 2 % EX PADS
6.0000 | MEDICATED_PAD | Freq: Every day | CUTANEOUS | Status: DC
  Administered 2024-07-27 – 2024-08-07 (×11): 6 via TOPICAL

## 2024-07-26 MED ORDER — SUCCINYLCHOLINE CHLORIDE 20 MG/ML IJ SOLN
INTRAMUSCULAR | Status: AC | PRN
  Administered 2024-07-26: 120 mg via INTRAVENOUS

## 2024-07-26 MED ORDER — VANCOMYCIN HCL 1750 MG/350ML IV SOLN
1750.0000 mg | Freq: Once | INTRAVENOUS | Status: AC
  Administered 2024-07-26: 1750 mg via INTRAVENOUS
  Filled 2024-07-26: qty 350

## 2024-07-26 MED ORDER — STROKE: EARLY STAGES OF RECOVERY BOOK
Freq: Once | Status: AC
  Filled 2024-07-26: qty 1

## 2024-07-26 MED ORDER — LACTATED RINGERS IV BOLUS (SEPSIS)
1000.0000 mL | Freq: Once | INTRAVENOUS | Status: AC
  Administered 2024-07-26: 1000 mL via INTRAVENOUS

## 2024-07-26 MED ORDER — SODIUM CHLORIDE 0.9 % IV SOLN
2.0000 g | Freq: Two times a day (BID) | INTRAVENOUS | Status: DC
  Administered 2024-07-26 – 2024-07-27 (×2): 2 g via INTRAVENOUS
  Filled 2024-07-26 (×2): qty 12.5

## 2024-07-26 MED ORDER — SODIUM BICARBONATE 8.4 % IV SOLN
100.0000 meq | Freq: Once | INTRAVENOUS | Status: AC
Start: 2024-07-26 — End: 2024-07-26

## 2024-07-26 MED ORDER — ETOMIDATE 2 MG/ML IV SOLN
INTRAVENOUS | Status: AC | PRN
  Administered 2024-07-26: 20 mg via INTRAVENOUS

## 2024-07-26 MED ORDER — FENTANYL 2500MCG IN NS 250ML (10MCG/ML) PREMIX INFUSION
0.0000 ug/h | INTRAVENOUS | Status: DC
  Administered 2024-07-26: 100 ug/h via INTRAVENOUS
  Administered 2024-07-27: 75 ug/h via INTRAVENOUS
  Filled 2024-07-26 (×3): qty 250

## 2024-07-26 MED ORDER — PROPOFOL 1000 MG/100ML IV EMUL
0.0000 ug/kg/min | INTRAVENOUS | Status: DC
  Administered 2024-07-26: 5 ug/kg/min via INTRAVENOUS
  Administered 2024-07-27 (×2): 20 ug/kg/min via INTRAVENOUS
  Filled 2024-07-26: qty 200
  Filled 2024-07-26 (×2): qty 100

## 2024-07-26 MED ORDER — SODIUM CHLORIDE 0.9 % IV SOLN
2.0000 g | Freq: Once | INTRAVENOUS | Status: AC
  Administered 2024-07-26: 2 g via INTRAVENOUS
  Filled 2024-07-26: qty 20

## 2024-07-26 MED ORDER — MIDODRINE HCL 5 MG PO TABS
10.0000 mg | ORAL_TABLET | Freq: Three times a day (TID) | ORAL | Status: DC
  Administered 2024-07-27 (×3): 10 mg
  Filled 2024-07-26 (×3): qty 2

## 2024-07-26 MED ORDER — FENTANYL BOLUS VIA INFUSION
25.0000 ug | INTRAVENOUS | Status: DC | PRN
  Administered 2024-07-28 (×2): 50 ug via INTRAVENOUS

## 2024-07-26 MED ORDER — NOREPINEPHRINE 4 MG/250ML-% IV SOLN
0.0000 ug/min | INTRAVENOUS | Status: DC
  Administered 2024-07-26: 2 ug/min via INTRAVENOUS
  Filled 2024-07-26: qty 250

## 2024-07-26 MED ORDER — DOCUSATE SODIUM 100 MG PO CAPS: 100.0000 mg | ORAL_CAPSULE | Freq: Two times a day (BID) | ORAL | Status: DC | PRN

## 2024-07-26 MED ORDER — FENTANYL CITRATE PF 50 MCG/ML IJ SOSY
25.0000 ug | PREFILLED_SYRINGE | Freq: Once | INTRAMUSCULAR | Status: AC
  Administered 2024-07-26: 50 ug via INTRAVENOUS

## 2024-07-26 MED ORDER — VANCOMYCIN HCL 1250 MG/250ML IV SOLN: 1250.0000 mg | INTRAVENOUS | Status: DC

## 2024-07-26 NOTE — Sepsis Progress Note (Signed)
 Notified bedside nurse of need to draw repeat lactic acid.

## 2024-07-26 NOTE — ED Triage Notes (Signed)
 Pt BIB [facility] from facility. Pt presented w respiratory distress and pin point pupils. No opioids administered at facility. Pt with distended abd and breathing 60/min. Wheezing diminished w rhonchi in all 4 quads. Once on truck pt had 1 min of apnea. 2 mg narcan given. 18g EJ placed by EMS. 500 saline and 1 mg narcan IV then given. Continued to be apneic and EMS began bagging. CBG WDL.   EMS VS 78/50 on arrival, 130/80 after bolus.

## 2024-07-26 NOTE — Progress Notes (Deleted)
 Cardiology Clinic Note   Patient Name: Jordan Kennedy Date of Encounter: 07/26/2024  Primary Care Provider:  [name] HERO, MD Primary Cardiologist:  Kardie Tobb, DO  Patient Profile    Jordan Kennedy 68 year old male presents to the clinic for follow-up of his chronic systolic CHF and hospital follow-up.  Past Medical History    Past Medical History:  Diagnosis Date   AKI (acute kidney injury) 06/29/2018   Anemia    Anemia, iron deficiency 09/18/2015   Bowel obstruction (HCC)    BPH (benign prostatic hyperplasia)    Chronic constipation    Chronic systolic heart failure (HCC)    History of ileus    Ileus (HCC) 03/07/2017   Mental retardation    Partial bowel obstruction (HCC)    Schizophrenia (HCC)    Monarche every three months;    SIRS (systemic inflammatory response syndrome) (HCC) 06/29/2018   Small bowel obstruction (HCC) 06/29/2018   No past surgical history on file.  Allergies  No Known Allergies  History of Present Illness    Jordan Kennedy has a PMH of chronic systolic CHF EF less than 20% 2/75, LBBB, frequent PVCs, schizophrenia, tardive dyskinesia, intellectual disability, anemia, tobacco abuse, and is a ward of the state.  He was seen and evaluated by Dr. Sheena 01/03/2023 for his LV dysfunction.  This was in the setting of hospital admission.  He had been admitted for 1-1/2 weeks.  He presented with nonproductive cough, nasal congestion, and weakness.  He was tested for COVID which was negative in the emergency department.  He was febrile with a fever of 101.4.  He had tachycardia, tachypnea, and hypoxia.  He required nonrebreather.  He was admitted and diagnosed with community-acquired pneumonia and CHF.  His BNP was 95.  His echocardiogram showed an LVEF of 25-30%, mild asymmetric LVH of the inferior lateral segment, mildly reduced RV function, moderate LAE, moderate-severe MR.  His cardiac troponins were elevated and it was felt to be demand ischemia.   He received IV diuresis.  His EKG showed sinus tachycardia with new LBBB from 2019.  Decision was made to forego/defer LHC due to patient's intellectual abilities and inability to follow directions.  There was concern that he was not going to be able to lay flat.  Echocardiogram 724 showed an EF less than 20%, global hypokinesis, septal/lateral dyssynchrony consistent with LBBB, G1 DD, moderately reduced RV function, and mild MR.  He continue to follow-up with cardiology regularly.  Medication titration for GDMT was addressed.  He was admitted 06/09/2024 until 06/17/2024 for septic shock secondary to UTI.  He was treated with broad-spectrum antibiotics.  He was discharged in stable condition.  He was admitted again on 06/26/2024 until 06/28/2024 with sepsis secondary to UTI.  He was again discharged in stable condition.  He was seen in follow-up on 07/01/2024 by Lum Louis NP.  He presented with his caregiver.  His caregiver noted he was experiencing persistent weakness and low blood pressure.  They reported systolic blood pressures in the 90s and occasionally dropping to the 60s.  He was taking midodrine  3 times per day to elevate his blood pressure.  Patient denied lightheadedness, dizziness, chest pain, and shortness of breath.  He had a urinary catheter in place and was taking antibiotics for UTI.  He denied abdominal pain and lower extremity pain.  He was euvolemic.  Due to his blood pressure his Entresto  was held.  Repeat echocardiogram was planned for 07/03/2024.  Patient reported that  he felt bad.  With standing in the office he became diaphoretic and experienced near syncope.  Patient was transferred to the emergency department due to his hypotension, tachycardia, and sepsis.  Plan to return to clinic in 2 weeks was made.  He was discharged on 07/11/2024.  His lab work showed white blood cell count of 15.9 and creatinine 1.3.  His UA was positive for LE but negative for nitrite.  His blood pressure  was noted to be 70/50.  CCM was consulted for ICU admission.  In the ICU he was started on Levophed  which was eventually titrated off.  His blood pressure did remain somewhat soft.  Palliative care was consulted.  DNR paperwork was started.  He received empiric antibiotics.  He continued to require Foley catheter.  He was continued on midodrine  10 mg 3 times daily.  His Farxiga  was also held due to recurrent UTI.  He was continued on low-dose metoprolol .  He did not require furosemide  in the hospital.  He was instructed to take furosemide  as needed for weight gain and symptom management.  Patient was discharged back to SNF.  He was instructed to follow-up with his within a week and also to return to cardiology for follow-up evaluation.  He presents to the clinic today for follow-up evaluation and states***.  *** denies chest pain, shortness of breath, lower extremity edema, fatigue, palpitations, melena, hematuria, hemoptysis, diaphoresis, weakness, presyncope, syncope, orthopnea, and PND.  HFrEF-appears to be euvolemic today.  Slowly increasing physical activity.  Blood pressure today***.  Entresto  and Farxiga  previously put on hold in the setting of hypotension and recurrent UTIs.   Unable to uptitrate GDMT today. Continue midodrine , metoprolol , furosemide  as needed Daily weights Heart healthy low-sodium diet Increase physical activity as tolerated Elevate lower extremities when not active  Tachycardia-heart rate today***. Maintain hydration Continue metoprolol  Continue to monitor  Septic shock-has had several admissions for for sepsis secondary to UTI.  Patient requires Foley catheter.  Farxiga  at eventually discontinued during most recent admission.  Patient afebrile.  Blood pressure today***. Adhere to good PeriCare and Foley care Monitor for fever, elevated heart rate, and hypotension Following with PCP  Schizophrenia-mood stable today. Continue current medical therapy Follows with  PCP  Goals of care-had palliative care consult during most recent admission.  Patient and DDS legal guardian working on DNR paperwork.  Plan was made to follow-up with palliative care as an outpatient during hospital discharge.  Disposition: Follow-up with Dr. Sheena or me in 2-3 months.  Home Medications    Prior to Admission medications   Medication Sig Start Date End Date Taking? Authorizing Provider  amantadine  (SYMMETREL ) 100 MG capsule Take 100 mg by mouth at bedtime.     [provider]  aspirin  EC (ASPIRIN  EC ADULT LOW DOSE) 81 MG tablet TAKE 1 TABLET BY MOUTH EVERY DAY Patient taking differently: Take 81 mg by mouth in the morning. 02/07/24   [name] HERO, MD  cloZAPine  (CLOZARIL ) 100 MG tablet Take 2 tablets (200 mg total) by mouth 2 (two) times daily. 03/12/17   Sebastian Toribio GAILS, MD  cyanocobalamin  1000 MCG tablet Take 1 tablet (1,000 mcg total) by mouth daily. Patient not taking: Reported on 07/02/2024 06/18/24   Jillian Buttery, MD  dapagliflozin  propanediol (FARXIGA ) 10 MG TABS tablet TAKE 1 TABLET BY MOUTH EVERY DAY Patient taking differently: Take 10 mg by mouth in the morning. 02/07/24   [name] HERO, MD  finasteride  (PROSCAR ) 5 MG tablet Take 1 tablet (  5 mg total) by mouth daily. Patient taking differently: Take 5 mg by mouth in the morning. 06/02/24 05/28/25  [name] HERO, MD  furosemide  (LASIX ) 40 MG tablet Take 40 mg by mouth as needed for fluid.    [provider]  LINZESS  290 MCG CAPS capsule TAKE 1 CAPSULE BY MOUTH EVERY MORNING BEFORE BREAKFAST 05/21/24   [name] HERO, MD  Melatonin 10 MG SUBL Place 10 mg under the tongue at bedtime.    [provider]  metoCLOPramide  (REGLAN ) 5 MG tablet Take 5 mg by mouth 2 (two) times daily. 06/23/24   [provider]  metoprolol  succinate (TOPROL -XL) 25 MG 24 hr tablet Take 12.5 mg by mouth in the morning. 06/23/24   [provider]  midodrine  (PROAMATINE ) 10 MG tablet Take 1 tablet (10  mg total) by mouth 3 (three) times daily with meals. Patient taking differently: Take 10 mg by mouth 2 (two) times daily with a meal. 06/17/24 08/16/24  Jillian Buttery, MD  MYRBETRIQ  50 MG TB24 tablet Take 50 mg by mouth at bedtime. 06/23/24   [provider]  omeprazole  (PRILOSEC) 40 MG capsule TAKE 1 CAPSULE BY MOUTH EVERY EVENING 02/07/24   [name] HERO, MD  PARoxetine  (PAXIL ) 40 MG tablet Take 40 mg by mouth every evening.     [provider]  polyethylene glycol powder (GLYCOLAX /MIRALAX ) 17 GM/SCOOP powder Take 17 g by mouth 2 (two) times daily. Dissolve 1 capful (17g) in 4-8 ounces of liquid and take by mouth daily. 06/28/24 09/26/24  Lue Elsie BROCKS, MD  sacubitril -valsartan  (ENTRESTO ) 24-26 MG Take 1 tablet by mouth 2 (two) times daily. 8am & 8 pm    [provider]  STIMULANT LAXATIVE 8.6-50 MG tablet Take 1 tablet by mouth 2 (two) times daily. 06/23/24   [provider]  tamsulosin  (FLOMAX ) 0.4 MG CAPS capsule Take 0.4 mg by mouth at bedtime. 01/10/21   [provider]    Family History    Family History  Problem Relation Age of Onset   Cancer Mother        unknown type   He indicated that his mother is deceased. He indicated that his father is alive. He indicated that his sister is alive. He indicated that his brother is alive.  Social History    Social History   Socioeconomic History   Marital status: Single    Spouse name: Not on file   Number of children: Not on file   Years of education: Not on file   Highest education level: Not on file  Occupational History   Not on file  Tobacco Use   Smoking status: Former    Current packs/day: 0.25    Average packs/day: 0.3 packs/day for 30.0 years (7.5 ttl pk-yrs)    Types: Cigarettes   Smokeless tobacco: Never  Vaping Use   Vaping status: Never Used  Substance and Sexual Activity   Alcohol use: No   Drug use: No   Sexual activity: Never  Other Topics Concern   Not on file   Social History Narrative   Marital status: single      Children: none       Employment: disability due to mental retardation and schizophrenia.      Lives: group home [name]; [facility]; 3 men in group home.        Tobacco: six cigarettes per day; since age 73.      Alcohol:  None  Drugs: none      Exercise:  Walking some.      Seatbelt:  100%   Social Drivers of Corporate investment banker Strain: Low Risk  (11/05/2023)   Overall Financial Resource Strain (CARDIA)    Difficulty of Paying Living Expenses: Not hard at all  Food Insecurity: Patient Unable To Answer (07/02/2024)   Hunger Vital Sign    Worried About Running Out of Food in the Last Year: Patient unable to answer    Ran Out of Food in the Last Year: Patient unable to answer  Transportation Needs: Patient Unable To Answer (07/02/2024)   PRAPARE - Transportation    Lack of Transportation (Medical): Patient unable to answer    Lack of Transportation (Non-Medical): Patient unable to answer  Physical Activity: Inactive (11/05/2023)   Exercise Vital Sign    Days of Exercise per Week: 0 days    Minutes of Exercise per Session: 0 min  Stress: No Stress Concern Present (11/05/2023)   [employer] of Occupational Health - Occupational Stress Questionnaire    Feeling of Stress : Not at all  Social Connections: Patient Unable To Answer (07/02/2024)   Social Connection and Isolation Panel    Frequency of Communication with Friends and Family: Patient unable to answer    Frequency of Social Gatherings with Friends and Family: Patient unable to answer    Attends Religious Services: Patient unable to answer    Active Member of Clubs or Organizations: Patient unable to answer    Attends Banker Meetings: Patient unable to answer    Marital Status: Patient unable to answer  Intimate Partner Violence: Patient Unable To Answer (07/02/2024)   Humiliation, Afraid, Rape, and Kick questionnaire     Fear of Current or Ex-Partner: Patient unable to answer    Emotionally Abused: Patient unable to answer    Physically Abused: Patient unable to answer    Sexually Abused: Patient unable to answer     Review of Systems    General:  No chills, fever, night sweats or weight changes.  Cardiovascular:  No chest pain, dyspnea on exertion, edema, orthopnea, palpitations, paroxysmal nocturnal dyspnea. Dermatological: No rash, lesions/masses Respiratory: No cough, dyspnea Urologic: No hematuria, dysuria Abdominal:   No nausea, vomiting, diarrhea, bright red blood per rectum, melena, or hematemesis Neurologic:  No visual changes, wkns, changes in mental status. All other systems reviewed and are otherwise negative except as noted above.  Physical Exam    VS:  There were no vitals taken for this visit. , BMI There is no height or weight on file to calculate BMI. GEN: Well nourished, well developed, in no acute distress. HEENT: normal. Neck: Supple, no JVD, carotid bruits, or masses. Cardiac: RRR, no murmurs, rubs, or gallops. No clubbing, cyanosis, edema.  Radials/DP/PT 2+ and equal bilaterally.  Respiratory:  Respirations regular and unlabored, clear to auscultation bilaterally. GI: Soft, nontender, nondistended, BS + x 4. MS: no deformity or atrophy. Skin: warm and dry, no rash. Neuro:  Strength and sensation are intact. Psych: Normal affect.  Accessory Clinical Findings    Recent Labs: 06/10/2024: TSH 1.100 06/13/2024: B Natriuretic Peptide 903.0 07/01/2024: ALT 21 07/03/2024: Magnesium  1.8 07/08/2024: BUN 17; Creatinine, Ser 0.98; Hemoglobin 11.2; Platelets 378; Potassium 4.4; Sodium 140   Recent Lipid Panel    Component Value Date/Time   CHOL 144 01/31/2021 1000   TRIG 143.0 01/31/2021 1000   HDL 41.40 01/31/2021 1000   CHOLHDL 3 01/31/2021 1000   VLDL  28.6 01/31/2021 1000   LDLCALC 74 01/31/2021 1000    No BP recorded.  {Refresh Note OR Click here to enter BP  :1}***     ECG personally reviewed by me today- ***    Echocardiogram 07/02/2024  IMPRESSIONS     1. Left ventricular ejection fraction, by estimation, is <20%. The left  ventricle has severely decreased function. The left ventricle demonstrates  regional wall motion abnormalities (see scoring diagram/findings for  description). The left ventricular  internal cavity size was moderately dilated. Left ventricular diastolic  function could not be evaluated.   2. Right ventricular systolic function is normal. The right ventricular  size is normal.   3. Left atrial size was mildly dilated.   4. The mitral valve is normal in structure. Mild mitral valve  regurgitation. No evidence of mitral stenosis.   5. The aortic valve is normal in structure. Aortic valve regurgitation is  not visualized. No aortic stenosis is present.   6. The inferior vena cava is normal in size with greater than 50%  respiratory variability, suggesting right atrial pressure of 3 mmHg.   FINDINGS   Left Ventricle: Left ventricular ejection fraction, by estimation, is  <20%. The left ventricle has severely decreased function. The left  ventricle demonstrates regional wall motion abnormalities. The left  ventricular internal cavity size was moderately  dilated. There is no left ventricular hypertrophy. Left ventricular  diastolic function could not be evaluated.     LV Wall Scoring:  The anterior septum, mid inferoseptal segment, and basal inferoseptal  segment  are akinetic. The entire anterior wall, entire lateral wall, entire apex,  and  entire inferior wall are hypokinetic.   Right Ventricle: The right ventricular size is normal. No increase in  right ventricular wall thickness. Right ventricular systolic function is  normal.   Left Atrium: Left atrial size was mildly dilated.   Right Atrium: Right atrial size was normal in size.   Pericardium: There is no evidence of pericardial effusion.   Mitral Valve:  The mitral valve is normal in structure. Mild mitral valve  regurgitation. No evidence of mitral valve stenosis.   Tricuspid Valve: The tricuspid valve is normal in structure. Tricuspid  valve regurgitation is not demonstrated. No evidence of tricuspid  stenosis.   Aortic Valve: The aortic valve is normal in structure. Aortic valve  regurgitation is not visualized. No aortic stenosis is present.   Pulmonic Valve: The pulmonic valve was normal in structure. Pulmonic valve  regurgitation is trivial. No evidence of pulmonic stenosis.   Aorta: The aortic root is normal in size and structure.   Venous: The inferior vena cava is normal in size with greater than 50%  respiratory variability, suggesting right atrial pressure of 3 mmHg.   IAS/Shunts: No atrial level shunt detected by color flow Doppler.       Assessment & Plan   1.  ***   Josefa HERO. Afiya Ferrebee NP-C     07/26/2024, 1:26 PM [facility] [address] [place], [place] [address] Office [phone]    Notice: This dictation was prepared with Dragon dictation along with smaller phrase technology. Any transcriptional errors that result from this process are unintentional and may not be corrected upon review.   I spent***minutes examining this patient, reviewing medications, and using patient centered shared decision making involving their cardiac care.   I spent  20 minutes reviewing past medical history,  medications, and prior cardiac tests.

## 2024-07-26 NOTE — Consult Note (Signed)
 [name] is a 68 y.o. M with PMH schizophrenia and intellectual disability, HFrEF with EF <20% on last echo, hx of BPH and bladder outlet obstruction with indwelling foley and multiple recent admissions and ED visits for foley obstruction and UTI's, most recent discharge 9/20 who presented with respiratory distress and pin point pupils  and was intubated in the ED. Labs significant for elevated lactic acid, troponin and WBC of 20k. CXR with patchy opacities and edema and CT head pending.  PCCM consulted for admission   Patient presented to the ED with respiratory distress and altered mental status.  Patient also noted to have low-grade fever with findings concerning for evolving sepsis.   EMS attempted Narcan without any significant improvement.  Patient remained altered pulling at mask and lines.  He was intubated primarily for airway protection with his altered mental status and respiratory difficulty   Patient was noted to have borderline blood pressures.  He was started on IV fluids and started on empiric antibiotics.   His laboratory test were notable for leukocytosis.  He also had increased BUN and creatinine concerning for an AKI.   Patient also has elevated troponins and BNP suggestive of possible NSTEMI and CHF component.  [facility] facility. Pt presented w respiratory distress and pin point pupils. No opioids administered at facility. Pt with distended abd and breathing 60/min. Wheezing diminished w rhonchi in all 4 quads. Once on truck pt had 1 min of apnea. 2 mg narcan given. 18g EJ placed by EMS. 500 saline and 1 mg narcan IV then given. Continued to be apneic and EMS began bagging. CBG WDL.    EMS VS 78/50 on arrival, 130/80 after bolus.    CTH  no crowding Acute L MCA Chronic R MCA  CTH 8/19 Already has the R MCA remote  ED note is 532pm [place] is unknown but dating of [facility]

## 2024-07-26 NOTE — ED Provider Notes (Signed)
 [place] EMERGENCY DEPARTMENT AT [facility] Provider Note   CSN: [id number] Arrival date & time: 07/26/24  8283     Patient presents with: Altered mental status, respiratory distress  Jordan Kennedy is a 68 y.o. male.   HPI   Patient has history of schizophrenia tobacco use disorder, acute kidney injury, bowel obstruction, heart failure, hydronephrosis, sepsis, metabolic encephalopathy.  Patient was brought to the ED for evaluation of respiratory difficulty altered mental status.  EMS admitted started Narcan with some questionable improvement.  He however also required bag-valve-mask ventilation due to apnea during transport.  He was given a total of 3 mg of narcan. arrival patient is unable to provide any history.  He is grabbing at the face mask.  He pulled out his nasal trumpet.  He is not following any commands  Prior to Admission medications   Medication Sig Start Date End Date Taking? Authorizing Provider  amantadine  (SYMMETREL ) 100 MG capsule Take 100 mg by mouth at bedtime.    Yes [provider]  azithromycin  (ZITHROMAX ) 500 MG tablet Take 500 mg by mouth daily.   Yes [provider]  cefTRIAXone  (ROCEPHIN ) 1 g injection Inject 1 g into the muscle daily. For 5 days   Yes [provider]  finasteride  (PROSCAR ) 5 MG tablet Take 1 tablet (5 mg total) by mouth daily. Patient taking differently: Take 5 mg by mouth in the morning. 06/02/24 05/28/25 Yes RuddGarnette HERO, MD  LINZESS  290 MCG CAPS capsule TAKE 1 CAPSULE BY MOUTH EVERY MORNING BEFORE BREAKFAST Patient taking differently: Take 290 mcg by mouth daily before breakfast. 05/21/24  Yes [name] HERO, MD  melatonin 5 MG TABS Take 10 mg by mouth at bedtime.   Yes [provider]  metoprolol  succinate (TOPROL -XL) 25 MG 24 hr tablet Take 12.5 mg by mouth in the morning. 06/23/24  Yes [provider]  MYRBETRIQ  50 MG TB24 tablet Take 50 mg by mouth at bedtime. 06/23/24  Yes [provider]  omeprazole  (PRILOSEC) 40 MG capsule TAKE 1 CAPSULE BY MOUTH EVERY EVENING 02/07/24  Yes [name] HERO, MD  PARoxetine  (PAXIL ) 40 MG tablet Take 40 mg by mouth every evening.    Yes [provider]  tamsulosin  (FLOMAX ) 0.4 MG CAPS capsule Take 0.4 mg by mouth at bedtime. 01/10/21  Yes [provider]  aspirin  EC (ASPIRIN  EC ADULT LOW DOSE) 81 MG tablet TAKE 1 TABLET BY MOUTH EVERY DAY Patient taking differently: Take 81 mg by mouth in the morning. 02/07/24   [name] HERO, MD  cloZAPine  (CLOZARIL ) 100 MG tablet Take 2 tablets (200 mg total) by mouth 2 (two) times daily. 03/12/17   Sebastian Toribio GAILS, MD  cyanocobalamin  1000 MCG tablet Take 1 tablet (1,000 mcg total) by mouth daily. Patient not taking: Reported on 07/02/2024 06/18/24   Jillian Buttery, MD  dapagliflozin  propanediol (FARXIGA ) 10 MG TABS tablet TAKE 1 TABLET BY MOUTH EVERY DAY Patient taking differently: Take 10 mg by mouth in the morning. 02/07/24   [name] HERO, MD  furosemide  (LASIX ) 40 MG tablet Take 40 mg by mouth as needed for fluid.    [provider]  metoCLOPramide  (REGLAN ) 5 MG tablet Take 5 mg by mouth 2 (two) times daily. 06/23/24   [provider]  midodrine  (PROAMATINE ) 10 MG tablet Take 1 tablet (10 mg total) by mouth 3 (three) times daily with meals. Patient taking differently: Take 10 mg by mouth 2 (two) times daily with  a meal. 06/17/24 08/16/24  Adhikari, Amrit, MD  polyethylene glycol powder (GLYCOLAX /MIRALAX ) 17 GM/SCOOP powder Take 17 g by mouth 2 (two) times daily. Dissolve 1 capful (17g) in 4-8 ounces of liquid and take by mouth daily. 06/28/24 09/26/24  Lue Elsie BROCKS, MD  sacubitril -valsartan  (ENTRESTO ) 24-26 MG Take 1 tablet by mouth 2 (two) times daily. 8am & 8 pm    [provider]  STIMULANT LAXATIVE 8.6-50 MG tablet Take 1 tablet by mouth 2 (two) times daily. 06/23/24   [provider]    Allergies: Patient has no known allergies.     Review of Systems  Updated Vital Signs BP 92/73   Pulse 100   Temp 100.3 F (37.9 C) (Bladder)   Resp 20   Ht 1.956 m (6' 5)   Wt 82 kg   SpO2 100%   BMI 21.44 kg/m   Physical Exam Vitals and nursing note reviewed.  Constitutional:      Appearance: He is well-developed.  HENT:     Head: Normocephalic and atraumatic.     Right Ear: External ear normal.     Left Ear: External ear normal.  Eyes:     General: No scleral icterus.       Right eye: No discharge.        Left eye: No discharge.     Conjunctiva/sclera: Conjunctivae normal.     Comments: Pupils are pinpoint bilaterally  Neck:     Trachea: No tracheal deviation.  Cardiovascular:     Rate and Rhythm: Regular rhythm. Tachycardia present.  Pulmonary:     Effort: Tachypnea and respiratory distress present.     Breath sounds: No stridor. Rhonchi present. No wheezing or rales.  Abdominal:     General: Bowel sounds are normal. There is no distension.     Palpations: Abdomen is soft.     Tenderness: There is no abdominal tenderness. There is no guarding or rebound.  Musculoskeletal:        General: No tenderness or deformity.     Cervical back: Neck supple.     Right lower leg: No edema.     Left lower leg: No edema.  Skin:    General: Skin is warm and dry.     Findings: No rash.  Neurological:     General: No focal deficit present.     Mental Status: He is confused.     GCS: GCS eye subscore is 1. GCS verbal subscore is 2. GCS motor subscore is 5.     Motor: No seizure activity.     Comments: Patient is not following commands.    Psychiatric:        Mood and Affect: Mood normal.     (all labs ordered are listed, but only abnormal results are displayed) Labs Reviewed  COMPREHENSIVE METABOLIC PANEL WITH GFR - Abnormal; Notable for the following components:      Result Value   CO2 15 (*)    Glucose, Bld 170 (*)    BUN 46 (*)    Creatinine, Ser 1.91 (*)    Calcium  8.5 (*)    Total Protein 6.4 (*)     Albumin  3.0 (*)    AST 99 (*)    ALT 131 (*)    Alkaline Phosphatase 139 (*)    GFR, Estimated 38 (*)    Anion gap 17 (*)    All other components within normal limits  CBC WITH DIFFERENTIAL/PLATELET - Abnormal; Notable for the following components:  WBC 20.3 (*)    RBC 3.71 (*)    Hemoglobin 11.2 (*)    HCT 36.4 (*)    RDW 21.3 (*)    nRBC 0.3 (*)    Neutro Abs 17.1 (*)    Monocytes Absolute 1.2 (*)    All other components within normal limits  PROTIME-INR - Abnormal; Notable for the following components:   Prothrombin Time 15.7 (*)    All other components within normal limits  URINALYSIS, W/ REFLEX TO CULTURE (INFECTION SUSPECTED) - Abnormal; Notable for the following components:   APPearance CLOUDY (*)    Hgb urine dipstick MODERATE (*)    Protein, ur 100 (*)    Leukocytes,Ua MODERATE (*)    Bacteria, UA MANY (*)    All other components within normal limits  BRAIN NATRIURETIC PEPTIDE - Abnormal; Notable for the following components:   B Natriuretic Peptide 2,638.4 (*)    All other components within normal limits  I-STAT CG4 LACTIC ACID, ED - Abnormal; Notable for the following components:   Lactic Acid, Venous 5.1 (*)    All other components within normal limits  I-STAT CHEM 8, ED - Abnormal; Notable for the following components:   Potassium 5.7 (*)    BUN 55 (*)    Creatinine, Ser 1.90 (*)    Glucose, Bld 167 (*)    Calcium , Ion 1.11 (*)    TCO2 18 (*)    Hemoglobin 12.6 (*)    HCT 37.0 (*)    All other components within normal limits  I-STAT ARTERIAL BLOOD GAS, ED - Abnormal; Notable for the following components:   pH, Arterial 7.328 (*)    pCO2 arterial 27.4 (*)    pO2, Arterial 377 (*)    Bicarbonate 14.4 (*)    TCO2 15 (*)    Acid-base deficit 10.0 (*)    HCT 33.0 (*)    Hemoglobin 11.2 (*)    All other components within normal limits  CBG MONITORING, ED - Abnormal; Notable for the following components:   Glucose-Capillary 159 (*)    All other components  within normal limits  TROPONIN I (HIGH SENSITIVITY) - Abnormal; Notable for the following components:   Troponin I (High Sensitivity) 1,348 (*)    All other components within normal limits  RESP PANEL BY RT-PCR (RSV, FLU A&B, COVID)  RVPGX2  CULTURE, BLOOD (ROUTINE X 2)  CULTURE, BLOOD (ROUTINE X 2)  URINE CULTURE  MRSA NEXT GEN BY PCR, NASAL  AMMONIA  TRIGLYCERIDES  CBC  CREATININE, SERUM  CBC  BASIC METABOLIC PANEL WITH GFR  MAGNESIUM   PHOSPHORUS  LEGIONELLA PNEUMOPHILA SEROGP 1 UR AG  STREP PNEUMONIAE URINARY ANTIGEN  I-STAT CG4 LACTIC ACID, ED  TROPONIN I (HIGH SENSITIVITY)    EKG: EKG Interpretation Date/Time:  Sunday July 26 2024 17:21:19 EDT Ventricular Rate:  108 PR Interval:  148 QRS Duration:  164 QT Interval:  389 QTC Calculation: 522 R Axis:   5  Text Interpretation: Sinus tachycardia Left atrial enlargement Left ventricular hypertrophy Left bundle branch block Prolonged QT interval No significant change since last tracing Confirmed by Randol Simmonds [phone]) on 07/26/2024 5:50:41 PM  Radiology: [facility] Chest Portable 1 View Result Date: 07/26/2024 CLINICAL DATA:  Questionable sepsis - evaluate for abnormality EXAM: PORTABLE CHEST 1 VIEW COMPARISON:  Chest x-ray 07/01/2024, CT angio chest 07/01/2024 FINDINGS: Endotracheal tube terminates 5 cm above the carina. Enteric tube courses below the hemidiaphragm with tip overlying the expected region of the gastric lumen and side  port overlying the expected region of the gastroesophageal junction. The heart and mediastinal contours are unchanged. Vague diffuse patchy airspace and interstitial opacities. No pleural effusion. No pneumothorax. No acute osseous abnormality. IMPRESSION: 1. Enteric tube courses below the hemidiaphragm with tip overlying the expected region of the gastric lumen and side port overlying the expected region of the gastroesophageal junction. Consider advancing by 3 cm. 2. Vague diffuse patchy airspace and  interstitial opacities. Finding may represent infection/inflammation versus pulmonary edema. Followup PA and lateral chest X-ray is recommended in 3-4 weeks following therapy to ensure resolution. Electronically Signed   By: Morgane  Naveau M.D.   On: 07/26/2024 18:36     .Critical Care  Performed by: Randol Simmonds, MD Authorized by: Randol Simmonds, MD   Critical care provider statement:    Critical care time (minutes):  40   Critical care was time spent personally by me on the following activities:  Development of treatment plan with patient or surrogate, discussions with consultants, evaluation of patient's response to treatment, examination of patient, ordering and review of laboratory studies, ordering and review of radiographic studies, ordering and performing treatments and interventions, pulse oximetry, re-evaluation of patient's condition and review of old charts Procedure Name: Intubation Date/Time: 07/26/2024 7:35 PM  Performed by: Randol Simmonds, MDPre-anesthesia Checklist: Patient identified, Patient being monitored, Timeout performed, Emergency Drugs available and Suction available Oxygen Delivery Method: Non-rebreather mask Preoxygenation: Pre-oxygenation with 100% oxygen Induction Type: Rapid sequence Laryngoscope Size: Glidescope Grade View: Grade I Number of attempts: 1 Airway Equipment and Method: Video-laryngoscopy Placement Confirmation: ETT inserted through vocal cords under direct vision, CO2 detector and Breath sounds checked- equal and bilateral Dental Injury: Teeth and Oropharynx as per pre-operative assessment        Medications Ordered in the ED  fentaNYL  in NS 250mL (10mcg/ml) infusion-PREMIX (350 mcg/hr Intravenous Rate/Dose Change 07/26/24 1932)  fentaNYL  (SUBLIMAZE ) bolus via infusion 25-100 mcg (has no administration in time range)  propofol (DIPRIVAN) 1000 MG/100ML infusion (0 mcg/kg/min  82 kg Intravenous Hold 07/26/24 1831)  etomidate (AMIDATE) injection  (20 mg Intravenous Given 07/26/24 1723)  succinylcholine (ANECTINE) injection (120 mg Intravenous Given 07/26/24 1724)  lactated ringers  infusion (has no administration in time range)  lactated ringers  bolus 1,000 mL (1,000 mLs Intravenous New Bag/Given 07/26/24 1858)  Chlorhexidine  Gluconate Cloth 2 % PADS 6 each (6 each Topical Not Given 07/26/24 1927)  polyethylene glycol (MIRALAX  / GLYCOLAX ) packet 17 g (has no administration in time range)  heparin  injection 5,000 Units (has no administration in time range)  famotidine (PEPCID) tablet 20 mg (has no administration in time range)  insulin aspart (novoLOG) injection 0-9 Units (has no administration in time range)  docusate (COLACE) 50 MG/5ML liquid 100 mg (has no administration in time range)  ceFEPIme  (MAXIPIME ) 2 g in sodium chloride  0.9 % 100 mL IVPB (has no administration in time range)  vancomycin  (VANCOREADY) IVPB 1750 mg/350 mL (has no administration in time range)  fentaNYL  (SUBLIMAZE ) injection 25-50 mcg (50 mcg Intravenous Given 07/26/24 1736)  lactated ringers  bolus 1,000 mL (0 mLs Intravenous Stopped 07/26/24 1858)  lactated ringers  bolus 1,000 mL (1,000 mLs Intravenous New Bag/Given 07/26/24 1829)  cefTRIAXone  (ROCEPHIN ) 2 g in sodium chloride  0.9 % 100 mL IVPB (0 g Intravenous Stopped 07/26/24 1932)    Clinical Course as of 07/26/24 1938  Sun Jul 26, 2024  1839 I-stat chem 8, ED (not at [facility], [facility] or [facility])(!) Creatinine increased compared to previous [JK]  1839 Urinalysis, w/ Reflex to  Culture (Infection Suspected) -Urine, Catheterized(!) Urinalysis suggestive of UTI [JK]  1849 Case discussed with PCCM [JK]  1934 Chest x-ray shows appropriate location of the endotracheal tube.  Enteric tube can be advanced 3 cm.  Infiltrates noted on chest x-ray [JK]    Clinical Course User Index [JK] Randol Simmonds, MD                                 Medical Decision Making Problems Addressed: Congestive heart failure, unspecified HF chronicity,  unspecified heart failure type [facility]): acute illness or injury that poses a threat to life or bodily functions NSTEMI (non-ST elevated myocardial infarction) [facility]): acute illness or injury that poses a threat to life or bodily functions Sepsis, due to unspecified organism, unspecified whether acute organ dysfunction present [facility]): acute illness or injury  Amount and/or Complexity of Data Reviewed Labs: ordered. Decision-making details documented in ED Course. Radiology: ordered and independent interpretation performed.  Risk Prescription drug management. Decision regarding hospitalization.   Patient presented to the ED with respiratory distress and altered mental status.  Patient also noted to have low-grade fever with findings concerning for evolving sepsis.  EMS attempted Narcan without any significant improvement.  Patient remained altered pulling at mask and lines.  He was intubated primarily for airway protection with his altered mental status and respiratory difficulty  Patient was noted to have borderline blood pressures.  He was started on IV fluids and started on empiric antibiotics.  His laboratory test were notable for leukocytosis.  He also had increased BUN and creatinine concerning for an AKI.  Patient also has elevated troponins and BNP suggestive of possible NSTEMI and CHF component.  Case discussed with pulmonary critical care service.  Patient will be admitted for further treatment     Final diagnoses:  Sepsis, due to unspecified organism, unspecified whether acute organ dysfunction present [facility])  NSTEMI (non-ST elevated myocardial infarction) (HCC)  Congestive heart failure, unspecified HF chronicity, unspecified heart failure type [facility])    ED Discharge Orders     None          Randol Simmonds, MD 07/26/24 [phone]

## 2024-07-26 NOTE — H&P (Signed)
 NAME:  Jordan Kennedy, MRN:  [id number], DOB:  15-Dec-1955, LOS: 0 ADMISSION DATE:  07/26/2024, CONSULTATION DATE:  07/26/24 REFERRING MD:  EDP, CHIEF COMPLAINT:  respiratory distress    History of Present Illness:  Jordan Kennedy is a 68 y.o. M with PMH schizophrenia and intellectual disability, HFrEF with EF <20% on last echo, hx of BPH and bladder outlet obstruction with indwelling foley and multiple recent admissions and ED visits for foley obstruction and UTI's, most recent discharge 9/20 who presented with respiratory distress and pin point pupils  and was intubated in the ED. Labs significant for elevated lactic acid, troponin and WBC of 20k. CXR with patchy opacities and edema and CT head pending.  PCCM consulted for admission    Pertinent  Medical History   has a past medical history of AKI (acute kidney injury) (06/29/2018), Anemia, Anemia, iron deficiency (09/18/2015), Bowel obstruction (HCC), BPH (benign prostatic hyperplasia), Chronic constipation, Chronic systolic heart failure (HCC), History of ileus, Ileus (HCC) (03/07/2017), Mental retardation, Partial bowel obstruction (HCC), Schizophrenia (HCC), SIRS (systemic inflammatory response syndrome) (HCC) (06/29/2018), and Small bowel obstruction (HCC) (06/29/2018).   Significant Hospital Events: Including procedures, antibiotic start and stop dates in addition to other pertinent events   10/5 intubated and admit to PCCM  Interim History / Subjective:  As above   Objective    Blood pressure (!) 86/73, pulse (!) 102, temperature 100.3 F (37.9 C), temperature source Bladder, resp. rate 20, height 6' 5 (1.956 m), weight 82 kg, SpO2 100%.    Vent Mode: PRVC FiO2 (%):  [60 %] 60 % Set Rate:  [20 bmp] 20 bmp Vt Set:  [710 mL] 710 mL PEEP:  [5 cmH20] 5 cmH20 Plateau Pressure:  [21 cmH20] 21 cmH20  No intake or output data in the 24 hours ending 07/26/24 1851 Filed Weights   07/26/24 1725 07/26/24 1836  Weight: 82 kg 82 kg     Examination: General: chronically ill sedated/paralyzed HENT: pinpoint reactive equal Lungs: rhonci bases, passive on vent Cardiovascular: regular +murmur Abdomen: soft, hypoactive BS Extremities: trace edema Neuro: sedated/ paralyzed GU: cloudy urine  Resolved problem list   Assessment and Plan    Acute resp failure Pulmonary edema vs multifocal PNA  Encephalopathy, presumed metabolic P -cover broadly with vanc/cefepime  -respiratory cultures and viral panel, strep pneumo and legionella --Maintain full vent support with SAT/SBT as tolerated -titrate Vent setting to maintain SpO2 greater than or equal to 90%. -HOB elevated 30 degrees. -Plateau pressures less than 30 cm H20.  -Follow chest x-ray, ABG prn.   -Bronchial hygiene and RT/bronchodilator protocol.' -head CT pending   Septic shock, Urinary source + possible HCAP   Lactic acidosis  P -broad abx  -trend LA  -MAP goal > 65. Cont midodrine , might need pressors   AKI AGMA  BPH  P -follow renal indices, UOP  HFrEF, end stage - with possible acute exacerbation  Elevated troponins  Chronic hypotension  P -echo tomorrow -trend trop -cont home modidrine  Not cath candidate  Elevated LFTs  -AM LFTs   Schizophrenia  -resume meds as appropriate    GOC -previous admission reviewed, notably palliative care note 07/11/24 which indicates that legal guardian is to email DNR paperwork required for DSS.   -it is unclear if this occurred or not. I tried to reach legal guardian 07/26/24 without success. In looking at palliative documentation, it sounds like legal guardian knows pt is nearing EOL w end stage chronic illnesses, and was agreeable to outpt  palliative care, transition to hospice based on trajectory   Will ask palliative to speak again as patient QoL declining and I think continued aggressive interventions and hospitalizations are prolonging suffering.  Labs   CBC: Recent Labs  Lab 07/26/24 1730  07/26/24 1810 07/26/24 1815  WBC 20.3*  --   --   NEUTROABS PENDING  --   --   HGB 11.2* 11.2* 12.6*  HCT 36.4* 33.0* 37.0*  MCV 98.1  --   --   PLT 307  --   --     Basic Metabolic Panel: Recent Labs  Lab 07/26/24 1810 07/26/24 1815  NA 139 140  K 4.9 5.7*  CL  --  110  GLUCOSE  --  167*  BUN  --  55*  CREATININE  --  1.90*   GFR: Estimated Creatinine Clearance: 45 mL/min (A) (by C-G formula based on SCr of 1.9 mg/dL (H)). Recent Labs  Lab 07/26/24 1730 07/26/24 1818  WBC 20.3*  --   LATICACIDVEN  --  5.1*    Liver Function Tests: No results for input(s): AST, ALT, ALKPHOS, BILITOT, PROT, ALBUMIN  in the last 168 hours. No results for input(s): LIPASE, AMYLASE in the last 168 hours. Recent Labs  Lab 07/26/24 1732  AMMONIA 19    ABG    Component Value Date/Time   PHART 7.328 (L) 07/26/2024 1810   PCO2ART 27.4 (L) 07/26/2024 1810   PO2ART 377 (H) 07/26/2024 1810   HCO3 14.4 (L) 07/26/2024 1810   TCO2 18 (L) 07/26/2024 1815   ACIDBASEDEF 10.0 (H) 07/26/2024 1810   O2SAT 100 07/26/2024 1810     Coagulation Profile: Recent Labs  Lab 07/26/24 1730  INR 1.2    Cardiac Enzymes: No results for input(s): CKTOTAL, CKMB, CKMBINDEX, TROPONINI in the last 168 hours.  HbA1C: Hgb A1c MFr Bld  Date/Time Value Ref Range Status  06/27/2024 03:03 AM 6.6 (H) 4.8 - 5.6 % Final    Comment:    (NOTE) Diagnosis of Diabetes The following HbA1c ranges recommended by the American Diabetes Association (ADA) may be used as an aid in the diagnosis of diabetes mellitus.  Hemoglobin             Suggested A1C NGSP%              Diagnosis  <5.7                   Non Diabetic  5.7-6.4                Pre-Diabetic  >6.4                   Diabetic  <7.0                   Glycemic control for                       adults with diabetes.    09/14/2015 04:37 PM 5.9 (H) <5.7 % Final    Comment:                                                                            According to the  ADA Clinical Practice Recommendations for 2011, when HbA1c is used as a screening test:     >=6.5%   Diagnostic of Diabetes Mellitus            (if abnormal result is confirmed)   5.7-6.4%   Increased risk of developing Diabetes Mellitus   References:Diagnosis and Classification of Diabetes Mellitus,Diabetes Care,2011,34(Suppl 1):S62-S69 and Standards of Medical Care in         Diabetes - 2011,Diabetes Care,2011,34 (Suppl 1):S11-S61.       CBG: Recent Labs  Lab 07/26/24 1806  GLUCAP 159*    Review of Systems:   Unable to obtain intubated   Past Medical History:  He,  has a past medical history of AKI (acute kidney injury) (06/29/2018), Anemia, Anemia, iron deficiency (09/18/2015), Bowel obstruction (HCC), BPH (benign prostatic hyperplasia), Chronic constipation, Chronic systolic heart failure (HCC), History of ileus, Ileus (HCC) (03/07/2017), Mental retardation, Partial bowel obstruction (HCC), Schizophrenia (HCC), SIRS (systemic inflammatory response syndrome) (HCC) (06/29/2018), and Small bowel obstruction (HCC) (06/29/2018).   Surgical History:  No past surgical history on file.   Social History:   reports that he has quit smoking. His smoking use included cigarettes. He has a 7.5 pack-year smoking history. He has never used smokeless tobacco. He reports that he does not drink alcohol and does not use drugs.   Family History:  His family history includes Cancer in his mother.   Allergies No Known Allergies   Home Medications  Prior to Admission medications   Medication Sig Start Date End Date Taking? Authorizing Provider  amantadine  (SYMMETREL ) 100 MG capsule Take 100 mg by mouth at bedtime.     [provider]  aspirin  EC (ASPIRIN  EC ADULT LOW DOSE) 81 MG tablet TAKE 1 TABLET BY MOUTH EVERY DAY Patient taking differently: Take 81 mg by mouth in the morning. 02/07/24   [name] HERO, MD  cloZAPine  (CLOZARIL ) 100 MG  tablet Take 2 tablets (200 mg total) by mouth 2 (two) times daily. 03/12/17   Sebastian Toribio GAILS, MD  cyanocobalamin  1000 MCG tablet Take 1 tablet (1,000 mcg total) by mouth daily. Patient not taking: Reported on 07/02/2024 06/18/24   Jillian Buttery, MD  dapagliflozin  propanediol (FARXIGA ) 10 MG TABS tablet TAKE 1 TABLET BY MOUTH EVERY DAY Patient taking differently: Take 10 mg by mouth in the morning. 02/07/24   [name] HERO, MD  finasteride  (PROSCAR ) 5 MG tablet Take 1 tablet (5 mg total) by mouth daily. Patient taking differently: Take 5 mg by mouth in the morning. 06/02/24 05/28/25  [name] HERO, MD  furosemide  (LASIX ) 40 MG tablet Take 40 mg by mouth as needed for fluid.    [provider]  LINZESS  290 MCG CAPS capsule TAKE 1 CAPSULE BY MOUTH EVERY MORNING BEFORE BREAKFAST 05/21/24   [name] HERO, MD  Melatonin 10 MG SUBL Place 10 mg under the tongue at bedtime.    [provider]  metoCLOPramide  (REGLAN ) 5 MG tablet Take 5 mg by mouth 2 (two) times daily. 06/23/24   [provider]  metoprolol  succinate (TOPROL -XL) 25 MG 24 hr tablet Take 12.5 mg by mouth in the morning. 06/23/24   [provider]  midodrine  (PROAMATINE ) 10 MG tablet Take 1 tablet (10 mg total) by mouth 3 (three) times daily with meals. Patient taking differently: Take 10 mg by mouth 2 (two) times daily with a meal. 06/17/24 08/16/24  Jillian Buttery, MD  MYRBETRIQ  50 MG TB24 tablet Take 50 mg by mouth  at bedtime. 06/23/24   [provider]  omeprazole  (PRILOSEC) 40 MG capsule TAKE 1 CAPSULE BY MOUTH EVERY EVENING 02/07/24   [name] HERO, MD  PARoxetine  (PAXIL ) 40 MG tablet Take 40 mg by mouth every evening.     [provider]  polyethylene glycol powder (GLYCOLAX /MIRALAX ) 17 GM/SCOOP powder Take 17 g by mouth 2 (two) times daily. Dissolve 1 capful (17g) in 4-8 ounces of liquid and take by mouth daily. 06/28/24 09/26/24  Lue Elsie BROCKS, MD  sacubitril -valsartan  (ENTRESTO )  24-26 MG Take 1 tablet by mouth 2 (two) times daily. 8am & 8 pm    [provider]  STIMULANT LAXATIVE 8.6-50 MG tablet Take 1 tablet by mouth 2 (two) times daily. 06/23/24   [provider]  tamsulosin  (FLOMAX ) 0.4 MG CAPS capsule Take 0.4 mg by mouth at bedtime. 01/10/21   [provider]     Critical care time: 32 min cc time   Rolan Sharps MD and Leita Gleason PA PCCM

## 2024-07-26 NOTE — Progress Notes (Signed)
 Pharmacy Antibiotic Note  Jordan Kennedy is a 68 y.o. male admitted on 07/26/2024 with sepsis.  Pharmacy has been consulted for vancomycin  dosing.  Plan: Vancomycin  1750 mg IV x 1, then 1250 mg IV q 48h (eAUC 518) Monitor renal function, Cx and clinical progression to narrow Vancomycin  levels as indicated   Height: 6' 5 (195.6 cm) Weight: 82 kg (180 lb 12.4 oz) IBW/kg (Calculated) : 89.1  Temp (24hrs), Avg:98.7 F (37.1 C), Min:97.1 F (36.2 C), Max:100.3 F (37.9 C)  Recent Labs  Lab 07/26/24 1730 07/26/24 1815 07/26/24 1818 07/26/24 1927 07/26/24 1943  WBC 20.3*  --   --  17.3*  --   CREATININE 1.91* 1.90*  --   --  1.90*  LATICACIDVEN  --   --  5.1*  --   --     Estimated Creatinine Clearance: 45 mL/min (A) (by C-G formula based on SCr of 1.9 mg/dL (H)).    No Known Allergies  Dorn Poot, PharmD, [facility] Clinical Pharmacist ED Pharmacist Phone # [phone] 07/26/2024 7:46 PM

## 2024-07-26 NOTE — Progress Notes (Signed)
 07/26/2024 Stroke subacute noted on CT. Cannot admit to [place]. AM Neuro consult, message sent to call provider Focused stroke orderset MRI brain CTA head/neck, outside window for any intervention Echo Maintain normotension. Need to move up timeline on comfort care talks with guardian.  Rolan Sharps MD PCCM

## 2024-07-26 NOTE — ED Notes (Addendum)
 [facility] arrived at the bedside for transport to [facility]. [facility] administered 2 amps of Bicarb and requested the Propofol be started prior to them leaving.

## 2024-07-26 NOTE — Sepsis Progress Note (Signed)
 Elink following code sepsis

## 2024-07-27 ENCOUNTER — Inpatient Hospital Stay (HOSPITAL_COMMUNITY)

## 2024-07-27 ENCOUNTER — Ambulatory Visit: Admitting: General Practice

## 2024-07-27 ENCOUNTER — Ambulatory Visit: Attending: General Practice | Admitting: General Practice

## 2024-07-27 DIAGNOSIS — I63441 Cerebral infarction due to embolism of right cerebellar artery: Secondary | ICD-10-CM | POA: Diagnosis not present

## 2024-07-27 DIAGNOSIS — R0603 Acute respiratory distress: Secondary | ICD-10-CM | POA: Diagnosis not present

## 2024-07-27 DIAGNOSIS — R7989 Other specified abnormal findings of blood chemistry: Secondary | ICD-10-CM

## 2024-07-27 DIAGNOSIS — I6340 Cerebral infarction due to embolism of unspecified cerebral artery: Secondary | ICD-10-CM | POA: Diagnosis not present

## 2024-07-27 DIAGNOSIS — I5022 Chronic systolic (congestive) heart failure: Secondary | ICD-10-CM | POA: Diagnosis not present

## 2024-07-27 DIAGNOSIS — R29730 NIHSS score 30: Secondary | ICD-10-CM

## 2024-07-27 DIAGNOSIS — I428 Other cardiomyopathies: Secondary | ICD-10-CM

## 2024-07-27 DIAGNOSIS — I69391 Dysphagia following cerebral infarction: Secondary | ICD-10-CM

## 2024-07-27 DIAGNOSIS — Z515 Encounter for palliative care: Secondary | ICD-10-CM | POA: Diagnosis not present

## 2024-07-27 DIAGNOSIS — A419 Sepsis, unspecified organism: Secondary | ICD-10-CM | POA: Diagnosis not present

## 2024-07-27 DIAGNOSIS — K7200 Acute and subacute hepatic failure without coma: Secondary | ICD-10-CM

## 2024-07-27 DIAGNOSIS — I509 Heart failure, unspecified: Secondary | ICD-10-CM | POA: Diagnosis not present

## 2024-07-27 LAB — TROPONIN I (HIGH SENSITIVITY)
Troponin I (High Sensitivity): 3165 ng/L (ref <18)
Troponin I (High Sensitivity): 4514 ng/L (ref <18)
Troponin I (High Sensitivity): 4607 ng/L (ref <18)
Troponin I (High Sensitivity): 4282 ng/L (ref <18)

## 2024-07-27 LAB — ECHOCARDIOGRAM COMPLETE
AR max vel: 2.69 cm2
AV Area VTI: 2.78 cm2
AV Area mean vel: 2.68 cm2
AV Mean grad: 2 mmHg
AV Peak grad: 4 mmHg
Ao pk vel: 1 m/s
Area-P 1/2: 4.74 cm2
Calc EF: 28.1 %
Est EF: <20
Height: 77 in
MV M vel: 4.44 m/s
MV Peak grad: 79 mmHg
MV VTI: 2.01 cm2
S' Lateral: 7.5 cm
Single Plane A2C EF: 33.5 %
Single Plane A4C EF: 14.5 %
Weight: 2892.44 [oz_av]

## 2024-07-27 LAB — BLOOD CULTURE ID PANEL (REFLEXED) - BCID2

## 2024-07-27 LAB — CBC
HCT: 30.2 % — ABNORMAL LOW (ref 39.0–52.0)
Hemoglobin: 9.7 g/dL — ABNORMAL LOW (ref 13.0–17.0)
MCH: 30.3 pg (ref 26.0–34.0)
MCHC: 32.1 g/dL (ref 30.0–36.0)
MCV: 94.4 fL (ref 80.0–100.0)
Platelets: 238 K/uL (ref 150–400)
RBC: 3.2 MIL/uL — ABNORMAL LOW (ref 4.22–5.81)
RDW: 20.7 % — ABNORMAL HIGH (ref 11.5–15.5)
WBC: 14.9 K/uL — ABNORMAL HIGH (ref 4.0–10.5)
nRBC: 0.4 % — ABNORMAL HIGH (ref 0.0–0.2)

## 2024-07-27 LAB — BASIC METABOLIC PANEL WITH GFR
Anion gap: 10 (ref 5–15)
BUN: 37 mg/dL — ABNORMAL HIGH (ref 8–23)
CO2: 19 mmol/L — ABNORMAL LOW (ref 22–32)
Calcium: 8.3 mg/dL — ABNORMAL LOW (ref 8.9–10.3)
Chloride: 112 mmol/L — ABNORMAL HIGH (ref 98–111)
Creatinine, Ser: 1.34 mg/dL — ABNORMAL HIGH (ref 0.61–1.24)
GFR, Estimated: 59 mL/min — ABNORMAL LOW (ref >60)
Glucose, Bld: 104 mg/dL — ABNORMAL HIGH (ref 70–99)
Potassium: 3.7 mmol/L (ref 3.5–5.1)
Sodium: 141 mmol/L (ref 135–145)

## 2024-07-27 LAB — PHOSPHORUS: Phosphorus: 3.7 mg/dL (ref 2.5–4.6)

## 2024-07-27 LAB — TRIGLYCERIDES: Triglycerides: 116 mg/dL (ref <150)

## 2024-07-27 LAB — TSH: TSH: 1.334 u[IU]/mL (ref 0.350–4.500)

## 2024-07-27 LAB — GLUCOSE, CAPILLARY
Glucose-Capillary: 104 mg/dL — ABNORMAL HIGH (ref 70–99)
Glucose-Capillary: 104 mg/dL — ABNORMAL HIGH (ref 70–99)
Glucose-Capillary: 114 mg/dL — ABNORMAL HIGH (ref 70–99)
Glucose-Capillary: 144 mg/dL — ABNORMAL HIGH (ref 70–99)
Glucose-Capillary: 77 mg/dL (ref 70–99)
Glucose-Capillary: 83 mg/dL (ref 70–99)
Glucose-Capillary: 90 mg/dL (ref 70–99)

## 2024-07-27 LAB — MAGNESIUM: Magnesium: 1.6 mg/dL — ABNORMAL LOW (ref 1.7–2.4)

## 2024-07-27 LAB — LIPID PANEL
Cholesterol: 104 mg/dL (ref 0–200)
HDL: 22 mg/dL — ABNORMAL LOW (ref >40)
LDL Cholesterol: 58 mg/dL (ref 0–99)
Total CHOL/HDL Ratio: 4.7 ratio
Triglycerides: 119 mg/dL (ref <150)
VLDL: 24 mg/dL (ref 0–40)

## 2024-07-27 LAB — HEMOGLOBIN A1C
Hgb A1c MFr Bld: 6.2 % — ABNORMAL HIGH (ref 4.8–5.6)
Mean Plasma Glucose: 131.24 mg/dL

## 2024-07-27 LAB — LACTIC ACID, PLASMA: Lactic Acid, Venous: 1.4 mmol/L (ref 0.5–1.9)

## 2024-07-27 LAB — HEPARIN LEVEL (UNFRACTIONATED): Heparin Unfractionated: <0.1 [IU]/mL — ABNORMAL LOW (ref 0.30–0.70)

## 2024-07-27 LAB — MRSA NEXT GEN BY PCR, NASAL: MRSA by PCR Next Gen: NOT DETECTED

## 2024-07-27 MED ORDER — ORAL CARE MOUTH RINSE: 15.0000 mL | OROMUCOSAL | Status: DC | PRN

## 2024-07-27 MED ORDER — PIPERACILLIN-TAZOBACTAM 3.375 G IVPB
3.3750 g | Freq: Three times a day (TID) | INTRAVENOUS | Status: DC
  Administered 2024-07-27 – 2024-07-29 (×6): 3.375 g via INTRAVENOUS
  Filled 2024-07-27 (×6): qty 50

## 2024-07-27 MED ORDER — ATORVASTATIN CALCIUM 80 MG PO TABS
80.0000 mg | ORAL_TABLET | Freq: Every day | ORAL | Status: DC
Start: 2024-07-27 — End: 2024-08-01
  Administered 2024-07-27 – 2024-08-01 (×5): 80 mg
  Filled 2024-07-27 (×5): qty 1

## 2024-07-27 MED ORDER — HEPARIN (PORCINE) 25000 UT/250ML-% IV SOLN
1900.0000 [IU]/h | INTRAVENOUS | Status: DC
  Administered 2024-07-27: 1000 [IU]/h via INTRAVENOUS
  Administered 2024-07-28: 1300 [IU]/h via INTRAVENOUS
  Administered 2024-07-29: 1600 [IU]/h via INTRAVENOUS
  Administered 2024-07-29 – 2024-07-30 (×2): 1850 [IU]/h via INTRAVENOUS
  Administered 2024-07-31: 1950 [IU]/h via INTRAVENOUS
  Administered 2024-07-31: 1850 [IU]/h via INTRAVENOUS
  Administered 2024-08-01 (×2): 1900 [IU]/h via INTRAVENOUS
  Filled 2024-07-27 (×9): qty 250

## 2024-07-27 MED ORDER — MIDODRINE HCL 5 MG PO TABS
10.0000 mg | ORAL_TABLET | Freq: Three times a day (TID) | ORAL | Status: DC
  Administered 2024-07-27 – 2024-07-29 (×5): 10 mg
  Filled 2024-07-27 (×5): qty 2

## 2024-07-27 MED ORDER — CALCIUM GLUCONATE-NACL 1-0.675 GM/50ML-% IV SOLN
1.0000 g | Freq: Once | INTRAVENOUS | Status: AC
  Administered 2024-07-27: 1000 mg via INTRAVENOUS
  Filled 2024-07-27: qty 50

## 2024-07-27 MED ORDER — PERFLUTREN LIPID MICROSPHERE
1.0000 mL | INTRAVENOUS | Status: AC | PRN
  Administered 2024-07-27: 4 mL via INTRAVENOUS

## 2024-07-27 MED ORDER — IOHEXOL 350 MG/ML SOLN
75.0000 mL | Freq: Once | INTRAVENOUS | Status: AC | PRN
  Administered 2024-07-27: 75 mL via INTRAVENOUS

## 2024-07-27 MED ORDER — MAGNESIUM SULFATE 2 GM/50ML IV SOLN
2.0000 g | Freq: Once | INTRAVENOUS | Status: AC
  Administered 2024-07-27: 2 g via INTRAVENOUS
  Filled 2024-07-27: qty 50

## 2024-07-27 MED ORDER — ASPIRIN 81 MG PO CHEW
81.0000 mg | CHEWABLE_TABLET | Freq: Every day | ORAL | Status: DC
Start: 2024-07-27 — End: 2024-08-01
  Administered 2024-07-27 – 2024-08-01 (×5): 81 mg
  Filled 2024-07-27 (×5): qty 1

## 2024-07-27 MED ORDER — ORAL CARE MOUTH RINSE
15.0000 mL | OROMUCOSAL | Status: DC
  Administered 2024-07-27 – 2024-07-31 (×51): 15 mL via OROMUCOSAL

## 2024-07-27 NOTE — Progress Notes (Signed)
 PHARMACY - ANTICOAGULATION CONSULT NOTE  Pharmacy Consult for heparin   Indication: chest pain/ACS and stroke  No Known Allergies  Patient Measurements: Height: 6' 5 (195.6 cm) Weight: 82 kg (180 lb 12.4 oz) IBW/kg (Calculated) : 89.1 HEPARIN  DW (KG): 82  Vital Signs: Temp: 99.3 F (37.4 C) (10/06 2000) Temp Source: Axillary (10/06 2000) BP: 115/74 (10/06 1800) Pulse Rate: 91 (10/06 2014)  Labs: Recent Labs    07/26/24 1730 07/26/24 1810 07/26/24 1927 07/26/24 1943 07/27/24 0116 07/27/24 0620 07/27/24 0950 07/27/24 1225 07/27/24 2013  HGB 11.2*   < > 9.6* 10.5*  --  9.7*  --   --   --   HCT 36.4*   < > 31.1* 31.0*  --  30.2*  --   --   --   PLT 307  --  261  --   --  238  --   --   --   LABPROT 15.7*  --   --   --   --   --   --   --   --   INR 1.2  --   --   --   --   --   --   --   --   HEPARINUNFRC  --   --   --   --   --   --   --   --  <0.10*  CREATININE 1.91*   < > 1.91* 1.90*  --  1.34*  --   --   --   TROPONINIHS 1,348*  --  1,442*  --    < > 4,514* 4,607* 4,282*  --    < > = values in this interval not displayed.    Estimated Creatinine Clearance: 63.7 mL/min (A) (by C-G formula based on SCr of 1.34 mg/dL (H)).   Medical History: Past Medical History:  Diagnosis Date   AKI (acute kidney injury) 06/29/2018   Anemia    Anemia, iron deficiency 09/18/2015   Bowel obstruction (HCC)    BPH (benign prostatic hyperplasia)    Chronic constipation    Chronic systolic heart failure (HCC)    History of ileus    Ileus (HCC) 03/07/2017   Mental retardation    Partial bowel obstruction (HCC)    Schizophrenia (HCC)    Monarche every three months;    SIRS (systemic inflammatory response syndrome) (HCC) 06/29/2018   Small bowel obstruction (HCC) 06/29/2018    Medications:  Infusions:   fentaNYL  infusion INTRAVENOUS Stopped (07/27/24 1409)   heparin  1,000 Units/hr (07/27/24 1900)   piperacillin -tazobactam (ZOSYN )  IV 3.375 g (07/27/24 2155)   propofol  (DIPRIVAN) infusion 20 mcg/kg/min (07/27/24 2147)   Assessment: Patient is a 68 y/o  male admitted for respiratory distress requiring intubation. Pharmacy is consulted for heparin  dosing for NSTEMI and stroke. Hgb 9.7/PLT 238.   10/6 PM - heparin  level undetectable on 1000 units/hr.  Troponin still elevated but trending down (cTn-hs 4607 peak >> 4282).  No issues per RN.   Goal of Therapy:  Heparin  level 0.3-0.5 units/ml Monitor platelets by anticoagulation protocol: Yes   Plan:  Increase heparin  IV to 1150 units/hr  No bolus per protocol  Heparin  level in 6 hours   Maurilio Fila, PharmD Clinical Pharmacist 07/27/2024  10:18 PM

## 2024-07-27 NOTE — Progress Notes (Signed)
 PT Cancellation Note  Patient Details Name: Jordan Kennedy MRN: [id number] DOB: March 29, 1956   Cancelled Treatment:    Reason Eval/Treat Not Completed: Active bedrest order  [name] S, PT DPT Acute Rehabilitation Services Secure Chat Preferred  Office [phone]  [name] 07/27/2024, 9:16 AM

## 2024-07-27 NOTE — Progress Notes (Signed)
 PHARMACY - ANTICOAGULATION CONSULT NOTE  Pharmacy Consult for heparin   Indication: chest pain/ACS and stroke  No Known Allergies  Patient Measurements: Height: 6' 5 (195.6 cm) Weight: 82 kg (180 lb 12.4 oz) IBW/kg (Calculated) : 89.1 HEPARIN  DW (KG): 82  Vital Signs: Temp: 97.9 F (36.6 C) (10/06 0700) Temp Source: Bladder (10/06 0400) BP: 94/74 (10/06 0700) Pulse Rate: 78 (10/06 0700)  Labs: Recent Labs    07/26/24 1730 07/26/24 1810 07/26/24 1927 07/26/24 1943 07/27/24 0116 07/27/24 0620  HGB 11.2*   < > 9.6* 10.5*  --  9.7*  HCT 36.4*   < > 31.1* 31.0*  --  30.2*  PLT 307  --  261  --   --  238  LABPROT 15.7*  --   --   --   --   --   INR 1.2  --   --   --   --   --   CREATININE 1.91*   < > 1.91* 1.90*  --  1.34*  TROPONINIHS 1,348*  --  1,442*  --  3,165* 4,514*   < > = values in this interval not displayed.    Estimated Creatinine Clearance: 63.7 mL/min (A) (by C-G formula based on SCr of 1.34 mg/dL (H)).   Medical History: Past Medical History:  Diagnosis Date   AKI (acute kidney injury) 06/29/2018   Anemia    Anemia, iron deficiency 09/18/2015   Bowel obstruction (HCC)    BPH (benign prostatic hyperplasia)    Chronic constipation    Chronic systolic heart failure (HCC)    History of ileus    Ileus (HCC) 03/07/2017   Mental retardation    Partial bowel obstruction (HCC)    Schizophrenia (HCC)    Monarche every three months;    SIRS (systemic inflammatory response syndrome) (HCC) 06/29/2018   Small bowel obstruction (HCC) 06/29/2018    Medications:  Infusions:   sodium chloride      ceFEPime  (MAXIPIME ) IV 2 g (07/27/24 0945)   fentaNYL  infusion INTRAVENOUS 75 mcg/hr (07/27/24 1041)   lactated ringers  150 mL/hr at 07/27/24 1037   magnesium  sulfate bolus IVPB 2 g (07/27/24 1033)   propofol (DIPRIVAN) infusion 20 mcg/kg/min (07/27/24 0700)   Assessment: Patient is a 68 y/o  male admitted for respiratory distress requiring intubation. Pharmacy  is consulted for heparin  dosing for NSTEMI and stroke. Hgb 9.7/PLT 238.   Goal of Therapy:  Heparin  level 0.3-0.5 units/ml Monitor platelets by anticoagulation protocol: Yes   Plan:  Start heparin  at 1,000 units/hr  No bolus per protocol  Heparin  level in 6 hours   Priyah Schmuck M Marigny Borre 07/27/2024,10:44 AM

## 2024-07-27 NOTE — Progress Notes (Signed)
 Pt transported from [facility] to MRI then to CT, and back to [facility] without complications.

## 2024-07-27 NOTE — Progress Notes (Signed)
 OT Cancellation Note  Patient Details Name: Jordan Kennedy MRN: [id number] DOB: 09-03-1956   Cancelled Treatment:    Reason Eval/Treat Not Completed: Active bedrest order  Etta NOVAK, OT Acute Rehabilitation Services Office [phone] Secure Chat Preferred    Etta GORMAN Hope 07/27/2024, 11:19 AM

## 2024-07-27 NOTE — Progress Notes (Signed)
 PHARMACY - PHYSICIAN COMMUNICATION CRITICAL VALUE ALERT - BLOOD CULTURE IDENTIFICATION (BCID)  Jordan Kennedy is an 68 y.o. male who presented to [facility] on 07/26/2024 with a chief complaint of resp distress.   Assessment:   1/4 bottles staph spp   Name of physician (or Provider) Contacted: Ria Hemming, MD   Current antibiotics: Zosyn    Changes to prescribed antibiotics recommended:  Patient is on recommended antibiotics - No changes needed  Results for orders placed or performed during the hospital encounter of 07/26/24  Blood Culture ID Panel (Reflexed) (Collected: 07/27/2024 12:00 PM)  Result Value Ref Range   Enterococcus faecalis NOT DETECTED NOT DETECTED   Enterococcus Faecium NOT DETECTED NOT DETECTED   Listeria monocytogenes NOT DETECTED NOT DETECTED   Staphylococcus species DETECTED (A) NOT DETECTED   Staphylococcus aureus (BCID) NOT DETECTED NOT DETECTED   Staphylococcus epidermidis NOT DETECTED NOT DETECTED   Staphylococcus lugdunensis NOT DETECTED NOT DETECTED   Streptococcus species NOT DETECTED NOT DETECTED   Streptococcus agalactiae NOT DETECTED NOT DETECTED   Streptococcus pneumoniae NOT DETECTED NOT DETECTED   Streptococcus pyogenes NOT DETECTED NOT DETECTED   A.calcoaceticus-baumannii NOT DETECTED NOT DETECTED   Bacteroides fragilis NOT DETECTED NOT DETECTED   Enterobacterales NOT DETECTED NOT DETECTED   Enterobacter cloacae complex NOT DETECTED NOT DETECTED   Escherichia coli NOT DETECTED NOT DETECTED   Klebsiella aerogenes NOT DETECTED NOT DETECTED   Klebsiella oxytoca NOT DETECTED NOT DETECTED   Klebsiella pneumoniae NOT DETECTED NOT DETECTED   Proteus species NOT DETECTED NOT DETECTED   Salmonella species NOT DETECTED NOT DETECTED   Serratia marcescens NOT DETECTED NOT DETECTED   Haemophilus influenzae NOT DETECTED NOT DETECTED   Neisseria meningitidis NOT DETECTED NOT DETECTED   Pseudomonas aeruginosa NOT DETECTED NOT DETECTED   Stenotrophomonas  maltophilia NOT DETECTED NOT DETECTED   Candida albicans NOT DETECTED NOT DETECTED   Candida auris NOT DETECTED NOT DETECTED   Candida glabrata NOT DETECTED NOT DETECTED   Candida krusei NOT DETECTED NOT DETECTED   Candida parapsilosis NOT DETECTED NOT DETECTED   Candida tropicalis NOT DETECTED NOT DETECTED   Cryptococcus neoformans/gattii NOT DETECTED NOT DETECTED    Massie Fila, PharmD Clinical Pharmacist  07/27/2024 8:31 PM

## 2024-07-27 NOTE — Progress Notes (Signed)
 Transported to [facility] ICU on vent by way of CAT scan without difficuty

## 2024-07-27 NOTE — Progress Notes (Addendum)
 STROKE TEAM PROGRESS NOTE    SIGNIFICANT HOSPITAL EVENTS: 10/5 - Admitted for altered mental status, pinpoint pupils, and respiratory difficulty, requiring intubation in the ED. Patient also noted to have low-grade fever, low BP with findings concerning for evolving sepsis in the setting of UTI. He was started on IV fluids and empiric antibiotics. Labs significant for elevated lactic acid, troponin and WBC of 20k. CXR with patchy opacities and edema.  CT Head showed early subacute infarct within the anterior left MCA territory.   MRI brain showed multiple acute non-hemorrhagic embolic infarcts involving the bilateral cerebral cortex, left thalamus, and right cerebellar hemisphere.  INTERIM HISTORY/SUBJECTIVE: Patient currently intubated and sedated and unable to provide further history. No family at bedside.    OBJECTIVE  CBC    Component Value Date/Time   WBC 14.9 (H) 07/27/2024 0620   RBC 3.20 (L) 07/27/2024 0620   HGB 9.7 (L) 07/27/2024 0620   HCT 30.2 (L) 07/27/2024 0620   PLT 238 07/27/2024 0620   MCV 94.4 07/27/2024 0620   MCV 95.6 11/04/2014 1744   MCH 30.3 07/27/2024 0620   MCHC 32.1 07/27/2024 0620   RDW 20.7 (H) 07/27/2024 0620   LYMPHSABS 2.0 07/26/2024 1730   MONOABS 1.2 (H) 07/26/2024 1730   EOSABS 0.0 07/26/2024 1730   BASOSABS 0.0 07/26/2024 1730    BMET    Component Value Date/Time   NA 141 07/27/2024 0620   NA 143 05/09/2023 1146   K 3.7 07/27/2024 0620   CL 112 (H) 07/27/2024 0620   CO2 19 (L) 07/27/2024 0620   GLUCOSE 104 (H) 07/27/2024 0620   BUN 37 (H) 07/27/2024 0620   BUN 17 05/09/2023 1146   CREATININE 1.34 (H) 07/27/2024 0620   CREATININE 0.92 09/14/2015 1637   CALCIUM  8.3 (L) 07/27/2024 0620   EGFR 62 05/09/2023 1146   GFRNONAA 59 (L) 07/27/2024 0620   GFRNONAA >89 09/14/2015 1637    IMAGING past 24 hours ECHOCARDIOGRAM COMPLETE Result Date: 07/27/2024    ECHOCARDIOGRAM REPORT   Patient Name:   Jordan Kennedy Date of Exam: 07/27/2024  Medical Rec #:  [id number]      Height:       77.0 in Accession #:    [id number]     Weight:       180.8 lb Date of Birth:  Aug 14, 1956      BSA:          2.143 m Patient Age:    68 years       BP:           94/74 mmHg Patient Gender: M              HR:           81 bpm. Exam Location:  Inpatient Procedure: 2D Echo, Cardiac Doppler, Color Doppler and Intracardiac            Opacification Agent (Both Spectral and Color Flow Doppler were            utilized during procedure). Indications:    Elevated troponin R79.89  History:        Patient has prior history of Echocardiogram examinations, most                 recent 07/02/2024. CHF and Cardiomyopathy, Arrythmias:LBBB and                 PVC, Signs/Symptoms:Edema; Risk Factors:Former Smoker.  Sonographer:    BERNARDA ROCKS Referring Phys: [id number] LAURA R  GLEASON IMPRESSIONS  1. Left ventricular ejection fraction, by estimation, is <20%. The left ventricle has severely decreased function. The left ventricle demonstrates global hypokinesis. The left ventricular internal cavity size was severely dilated. Left ventricular diastolic parameters are consistent with Grade II diastolic dysfunction (pseudonormalization).  2. Right ventricular systolic function is moderately reduced. The right ventricular size is moderately enlarged. There is mildly elevated pulmonary artery systolic pressure. The estimated right ventricular systolic pressure is 41.8 mmHg.  3. Left atrial size was severely dilated.  4. Right atrial size was severely dilated.  5. The mitral valve is normal in structure. Moderate to severe mitral valve regurgitation. No evidence of mitral stenosis.  6. Tricuspid valve regurgitation is moderate.  7. The aortic valve is tricuspid. Aortic valve regurgitation is not visualized. No aortic stenosis is present.  8. The inferior vena cava is dilated in size with <50% respiratory variability, suggesting right atrial pressure of 15 mmHg. FINDINGS  Left Ventricle: Left  ventricular ejection fraction, by estimation, is <20%. The left ventricle has severely decreased function. The left ventricle demonstrates global hypokinesis. Definity  contrast agent was given IV to delineate the left ventricular endocardial borders. The left ventricular internal cavity size was severely dilated. There is no left ventricular hypertrophy. Abnormal (paradoxical) septal motion, consistent with left bundle branch block. Left ventricular diastolic parameters are consistent with Grade II diastolic dysfunction (pseudonormalization). Right Ventricle: The right ventricular size is moderately enlarged. No increase in right ventricular wall thickness. Right ventricular systolic function is moderately reduced. There is mildly elevated pulmonary artery systolic pressure. The tricuspid regurgitant velocity is 2.59 m/s, and with an assumed right atrial pressure of 15 mmHg, the estimated right ventricular systolic pressure is 41.8 mmHg. Left Atrium: Left atrial size was severely dilated. Right Atrium: Right atrial size was severely dilated. Pericardium: Trivial pericardial effusion is present. The pericardial effusion is lateral to the left ventricle. Mitral Valve: The mitral valve is normal in structure. Moderate to severe mitral valve regurgitation, with posteriorly-directed jet. No evidence of mitral valve stenosis. MV peak gradient, 4.8 mmHg. The mean mitral valve gradient is 2.0 mmHg. Tricuspid Valve: The tricuspid valve is normal in structure. Tricuspid valve regurgitation is moderate . No evidence of tricuspid stenosis. Aortic Valve: The aortic valve is tricuspid. Aortic valve regurgitation is not visualized. No aortic stenosis is present. Aortic valve mean gradient measures 2.0 mmHg. Aortic valve peak gradient measures 4.0 mmHg. Aortic valve area, by VTI measures 2.78 cm. Pulmonic Valve: The pulmonic valve was normal in structure. Pulmonic valve regurgitation is trivial. No evidence of pulmonic stenosis.  Aorta: The aortic root is normal in size and structure. Venous: The inferior vena cava is dilated in size with less than 50% respiratory variability, suggesting right atrial pressure of 15 mmHg. IAS/Shunts: No atrial level shunt detected by color flow Doppler.  LEFT VENTRICLE PLAX 2D LVIDd:         8.50 cm      Diastology LVIDs:         7.50 cm      LV e' medial:    5.00 cm/s LV PW:         0.60 cm      LV E/e' medial:  20.8 LV IVS:        0.70 cm      LV e' lateral:   7.94 cm/s LVOT diam:     2.30 cm      LV E/e' lateral: 13.1 LV SV:  47 LV SV Index:   22 LVOT Area:     4.15 cm  LV Volumes (MOD) LV vol d, MOD A2C: 538.0 ml LV vol d, MOD A4C: 407.0 ml LV vol s, MOD A2C: 358.0 ml LV vol s, MOD A4C: 348.0 ml LV SV MOD A2C:     180.0 ml LV SV MOD A4C:     407.0 ml LV SV MOD BP:      140.3 ml RIGHT VENTRICLE            IVC RV Basal diam:  4.90 cm    IVC diam: 2.80 cm RV S prime:     7.83 cm/s TAPSE (M-mode): 1.4 cm RVSP:           41.8 mmHg LEFT ATRIUM              Index        RIGHT ATRIUM            Index LA diam:        4.10 cm  1.91 cm/m   RA Pressure: 15.00 mmHg LA Vol (A2C):   191.0 ml 89.14 ml/m  RA Area:     24.60 cm LA Vol (A4C):   74.1 ml  34.58 ml/m  RA Volume:   83.10 ml   38.78 ml/m LA Biplane Vol: 119.0 ml 55.54 ml/m  AORTIC VALVE                    PULMONIC VALVE AV Area (Vmax):    2.69 cm     PV Vmax:          0.66 m/s AV Area (Vmean):   2.68 cm     PV Peak grad:     1.7 mmHg AV Area (VTI):     2.78 cm     PR End Diast Vel: 0.61 msec AV Vmax:           99.70 cm/s AV Vmean:          61.900 cm/s AV VTI:            0.169 m AV Peak Grad:      4.0 mmHg AV Mean Grad:      2.0 mmHg LVOT Vmax:         64.60 cm/s LVOT Vmean:        39.900 cm/s LVOT VTI:          0.113 m LVOT/AV VTI ratio: 0.67  AORTA Ao Root diam: 3.30 cm Ao Asc diam:  2.80 cm MITRAL VALVE                TRICUSPID VALVE MV Area (PHT): 4.74 cm     TR Peak grad:   26.8 mmHg MV Area VTI:   2.01 cm     TR Vmax:        259.00 cm/s  MV Peak grad:  4.8 mmHg     Estimated RAP:  15.00 mmHg MV Mean grad:  2.0 mmHg     RVSP:           41.8 mmHg MV Vmax:       1.09 m/s MV Vmean:      66.6 cm/s    SHUNTS MV Decel Time: 160 msec     Systemic VTI:  0.11 m MR Peak grad: 79.0 mmHg     Systemic Diam: 2.30 cm MR Mean grad: 45.5 mmHg MR Vmax:      444.33 cm/s MR Vmean:  314.5 cm/s MV E velocity: 104.00 cm/s MV A velocity: 80.20 cm/s MV E/A ratio:  1.30 Oneil Parchment MD Electronically signed by Oneil Parchment MD Signature Date/Time: 07/27/2024/2:08:59 PM    Final    CT ANGIO HEAD NECK W WO CM Result Date: 07/27/2024 EXAM: CTA HEAD AND NECK WITH AND WITHOUT 07/27/2024 05:45:00 AM TECHNIQUE: CTA of the head and neck was performed with and without the administration of 75 mL of iohexol  (OMNIPAQUE ) 350 MG/ML injection. Multiplanar 2D and/or 3D reformatted images are provided for review. Automated exposure control, iterative reconstruction, and/or weight based adjustment of the mA/kV was utilized to reduce the radiation dose to as low as reasonably achievable. Stenosis of the internal carotid arteries measured using NASCET criteria. COMPARISON: None available CLINICAL HISTORY: Neuro deficit, acute, stroke suspected. Chief complaints; Respiratory Distress; CT ANGIO HEAD NECK W WO CM; Neuro deficit, Acute, Stroke suspected.; 75 [id number] 18G IV LAC; Without Incident. FINDINGS: CTA NECK: AORTIC ARCH AND ARCH VESSELS: No dissection or arterial injury. No significant stenosis of the brachiocephalic or subclavian arteries. CERVICAL CAROTID ARTERIES: No dissection, arterial injury, or hemodynamically significant stenosis by NASCET criteria. CERVICAL VERTEBRAL ARTERIES: No dissection, arterial injury, or significant stenosis. There is severe stenosis of the distal V4 segment of the left vertebral artery. The right vertebral artery is also diffusely irregular. LUNGS AND MEDIASTINUM: Unremarkable. SOFT TISSUES: No acute abnormality. BONES: No acute abnormality. CTA HEAD:  ANTERIOR CIRCULATION: No significant stenosis of the internal carotid arteries. The A1 segments are mildly irregular in contour. The M1 segments of the middle cerebral arteries are irregular in contour and demonstrate diffuse mild-to-moderate stenosis. There are moderate irregular stenoses of the proximal M2 branches of the middle cerebral arteries bilaterally, slightly worse on the right. The M2 branches appeared diffusely diminutive and irregular. No aneurysm. POSTERIOR CIRCULATION: The posterior cerebral arteries are diminutive and irregular. The basilar artery is irregular in contour and diminutive. No significant stenosis of the vertebral arteries. No aneurysm. OTHER: No dural venous sinus thrombosis on this non-dedicated study. 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 IMPRESSION: 1. 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 Electronically signed by: Evalene Coho MD 07/27/2024 06:16 AM EDT RP Workstation: [id number]   MR BRAIN WO CONTRAST Result Date: 07/27/2024 EXAM: MRI BRAIN WITHOUT CONTRAST 07/27/2024 05:26:00 AM TECHNIQUE: Multiplanar multisequence MRI of the head/brain was performed without the administration of intravenous contrast. COMPARISON: CT of the head dated 07/26/2024. CLINICAL HISTORY: Stroke, follow up. FINDINGS: BRAIN AND VENTRICLES: Restricted diffusion is present within the left mid frontal lobe, correlating with the area of diminished attenuation on the previous CT. There are also multiple foci of restricted diffusion present within the cerebral cortex bilaterally and within the left thalamus and right cerebellar hemisphere. A punctate cortical infarct is present in the right posterior frontal lobe on image 89 of series 5. There are a couple of foci of restricted diffusion at the right parietooccipital junction seen on images 81 and 82. There are a couple of foci of restricted diffusion in the right parietal lobe on image 90.  There are also small foci of restricted diffusion present within the left occipital lobe and left temporal occipital junction. The findings are compatible with embolic non-hemorrhagic infarcts. There is mild cerebral white matter disease also present. No intracranial hemorrhage. No mass. No midline shift. No hydrocephalus. The sella is unremarkable. Normal flow voids. ORBITS: No acute abnormality. SINUSES AND MASTOIDS: No acute abnormality. BONES AND SOFT TISSUES: Normal marrow signal. No acute soft tissue abnormality. IMPRESSION: 1. Multiple  acute non-hemorrhagic embolic infarcts involving the bilateral cerebral cortex, left thalamus, and right cerebellar hemisphere. 2. 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 Electronically signed by: Evalene Coho MD 07/27/2024 05:50 AM EDT RP Workstation: [id number]   DG Pelvis 1-2 Views Result Date: 07/27/2024 EXAM: 1 or 2 VIEW(S) XRAY OF THE PELVIS 07/27/2024 02:03:58 AM COMPARISON: None available. CLINICAL HISTORY: imaging to screen for metal prior to MRI FINDINGS: BONES AND JOINTS: Metallic probe overlies the inferior midline pelvis. No unexpected retained metallic foreign body within the pelvis SOFT TISSUES: Pelvic vascular calcifications. The soft tissues are unremarkable. IMPRESSION: 1. No unexpected retained metallic foreign body within the pelvis. Electronically signed by: Dorethia Molt MD 07/27/2024 02:21 AM EDT RP Workstation: [id number]   DG Abd 1 View Result Date: 07/27/2024 EXAM: 1 VIEW XRAY OF THE ABDOMEN 07/27/2024 02:03:58 AM COMPARISON: 05/16/2024 CLINICAL HISTORY: imaging to screen for metal prior to MRI FINDINGS: LINES, TUBES AND DEVICES: Transesophageal tube in place with tip terminating in the left upper quadrant within the stomach, with the side port at the level of the GE junction. BOWEL: Diffuse gaseous distention of the colon. Moderate colonic stool burden. Nonobstructive bowel gas pattern. SOFT TISSUES: No opaque urinary  calculi. BONES: No acute osseous abnormality. IMPRESSION: 1. No unexpected retained metallic foreign body within the visualized chest and abdomen. 2. Diffuse gaseous distention of the colon and moderate colonic stool burden. Electronically signed by: Dorethia Molt MD 07/27/2024 02:20 AM EDT RP Workstation: [id number]   CT Head Wo Contrast Result Date: 07/26/2024 EXAM: CT HEAD WITHOUT CONTRAST 07/26/2024 09:39:45 PM TECHNIQUE: CT of the head was performed without the administration of intravenous contrast. Automated exposure control, iterative reconstruction, and/or weight based adjustment of the mA/kV was utilized to reduce the radiation dose to as low as reasonably achievable. COMPARISON: CT Head dated 06/09/2024. CLINICAL HISTORY: Mental status change, unknown cause. FINDINGS: BRAIN AND VENTRICLES: Probable early subacute infarct within the anterior left MCA territory. ASPECTS score is 9 (0-10 with 10 being normal), with ganglionic level (caudate, lentiform nuclei, internal capsule, insula, M1-M3 cortex) scored at 7 and supraganglionic (M4-M6 cortex) scored at 2. No acute hemorrhage, hydrocephalus, extra-axial collection, mass effect, or midline shift. ORBITS: No acute abnormality. SINUSES: No acute abnormality. SOFT TISSUES AND SKULL: No acute soft tissue abnormality. No skull fracture. IMPRESSION: 1. Probable early subacute infarct within the anterior left MCA territory. 2. ASPECTS 9. Electronically signed by: Franky Stanford MD 07/26/2024 09:43 PM EDT RP Workstation: [id number]   DG Chest Portable 1 View Result Date: 07/26/2024 CLINICAL DATA:  Questionable sepsis - evaluate for abnormality EXAM: PORTABLE CHEST 1 VIEW COMPARISON:  Chest x-ray 07/01/2024, CT angio chest 07/01/2024 FINDINGS: Endotracheal tube terminates 5 cm above the carina. Enteric tube courses below the hemidiaphragm with tip overlying the expected region of the gastric lumen and side port overlying the expected region of the  gastroesophageal junction. The heart and mediastinal contours are unchanged. Vague diffuse patchy airspace and interstitial opacities. No pleural effusion. No pneumothorax. No acute osseous abnormality. IMPRESSION: 1. Enteric tube courses below the hemidiaphragm with tip overlying the expected region of the gastric lumen and side port overlying the expected region of the gastroesophageal junction. Consider advancing by 3 cm. 2. Vague diffuse patchy airspace and interstitial opacities. Finding may represent infection/inflammation versus pulmonary edema. Followup PA and lateral chest X-ray is recommended in 3-4 weeks following therapy to ensure resolution. Electronically Signed   By: Morgane  Naveau M.D.   On: 07/26/2024 18:36    Vitals:  07/27/24 1200 07/27/24 1300 07/27/24 1400 07/27/24 1500  BP: 92/72 91/67 (!) 87/64 90/67  Pulse: 95 82 80 84  Resp: 20 20 20 20   Temp: 99.3 F (37.4 C) 99.1 F (37.3 C) 99 F (37.2 C) 99.1 F (37.3 C)  TempSrc:      SpO2: 98% 100% 100% 100%  Weight:      Height:         PHYSICAL EXAM: General: Orally intubated and sedated CV: Regular rate and rhythm on monitor Respiratory: Regular, unlabored respirations on ventilator   NEURO: Patient is sedated and intubated on vent. Does not follow commands. Pupils 2 mm, round, equal and non-reactive. Corneal reflex intact bilaterally. Oculocephalic reflex not present. Gag/cough reflex intact. Non-purposeful movement of all 4 extremities in response to noxious stimuli.  Most Recent NIH: 30   ASSESSMENT/PLAN  Mr. Jordan Kennedy is a 68 y.o. male with history of schizophrenia, tobacco use disorder, HFrEF (<20%), chronic indwelling foley, intellectual disability admitted for respiratory distress, pinpoint pupils, altered mental status and intubated in the ED. NIH on Admission 26. CT head revealed hypodensity in the anterior L MCA c/f subacute infarct. Echo with several risk factors for atrial fibrillation including  several bi-atrial dilation and EF <20%, would consider likely underlying cardioembolic etiology of multiple infarcts.   Acute Ischemic Infarct:  Multiple acute ischemic infarcts Etiology:  cryptogenic Code Stroke CT head: probable early subacute infarct within the anterior left MCA territory. ASPECTS 9.    CTA head & neck: No significant stenosis of the internal carotid arteries. The A1 segments are mildly irregular in contour. The M1 segments of the middle cerebral arteries are irregular in contour and demonstrate diffuse mild-to-moderate stenosis. There are moderate irregular stenoses of the proximal M2 branches of the middle cerebral arteries bilaterally, slightly worse on the right. The M2 branches appeared diffusely diminutive and irregular. MRI: Multiple acute non-hemorrhagic embolic infarcts involving the bilateral cerebral cortex, left thalamus, and right cerebellar hemisphere. 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  2D Echo: LVEF <20%, G2DD, severe dilation of LA and RA, normal MV LDL 58 HgbA1c 6.2 VTE prophylaxis - Heparin  infusion Patient prescribed aspirin ,  but reported not taking prior to admission, now on aspirin  81 mg daily and heparin  IV  Therapy recommendations:  Pending Disposition:  Pending  Hx of Stroke/TIA No prior stroke history  Blood Pressure Home meds: Hold Entresto  24-26 mg, metoprolol  succinate Soft BP's currently; on Midodrine  Blood Pressure Goal: BP less than 220/110   Hyperlipidemia Home meds:  None LDL 58, goal < 70 Continue atorvastatin 80 mg daily  Continue statin at discharge  Diabetes type II Controlled Home meds:  Farxiga  HgbA1c 6.2, goal < 7.0 CBGs SSI Recommend close follow-up with PCP for better DM control  Dysphagia Patient has post-stroke dysphagia, SLP consulted    Diet   Diet NPO time specified    Other Stroke Risk Factors Congestive heart failure  Other Active Problems (Managed per PCCM) Acute resp  failure / Pulmonary edema vs multifocal PNA - RVP and urine strep neg /Encephalopathy, presumed metabolic Septic shock, Urinary source + possible HCAP  AKI / Hypocalcemia / Hypomagnesemia / BPH Elevated LFTs Schizophrenia   Hospital day # 1  I have personally obtained history,examined this patient, reviewed notes, independently viewed imaging studies, participated in medical decision making and plan of care.ROS completed by me personally and pertinent positives fully documented  I have made any additions or clarifications directly to the above note. Agree with note above.  Patient presented with sepsis and UTI and altered mental status and MRI scan shows multiple embolic strokes likely related to his cardiomyopathy with low EF. Exam is limited due to sedation and intubation.  Recommend anticoagulation with IV heparin  and repeat echocardiogram.  Wean off ventilatory support as tolerated.  Family available at the bedside for discussion.  Discussed with Dr. Harold critical care medicine. This patient is critically ill and at significant risk of neurological worsening, death and care requires constant monitoring of vital signs, hemodynamics,respiratory and cardiac monitoring, extensive review of multiple databases, frequent neurological assessment, discussion with family, other specialists and medical decision making of high complexity.I have made any additions or clarifications directly to the above note.This critical care time does not reflect procedure time, or teaching time or supervisory time of PA/NP/Med Resident etc but could involve care discussion time.  I spent 30 minutes of neurocritical care time  in the care of  this patient.     Eather Popp, MD Medical Director [facility] Pager: [phone] 07/27/2024 5:13 PM   To contact Stroke Continuity provider, please refer to [email or web address]. After hours, contact General Neurology

## 2024-07-27 NOTE — Progress Notes (Signed)
 NAME:  Jordan Kennedy, MRN:  [id number], DOB:  1956-08-07, LOS: 1 ADMISSION DATE:  07/26/2024, CONSULTATION DATE:  07/27/24 REFERRING MD:  EDP, CHIEF COMPLAINT:  respiratory distress    History of Present Illness:  Jordan Kennedy is a 68 y.o. M with PMH schizophrenia and intellectual disability, HFrEF with EF <20% on last echo, hx of BPH and bladder outlet obstruction with indwelling foley and multiple recent admissions and ED visits for foley obstruction and UTI's, most recent discharge 9/20 who presented with respiratory distress and pin point pupils  and was intubated in the ED. Labs significant for elevated lactic acid, troponin and WBC of 20k. CXR with patchy opacities and edema and CT head pending.  PCCM consulted for admission.   Pertinent  Medical History   has a past medical history of AKI (acute kidney injury) (06/29/2018), Anemia, Anemia, iron deficiency (09/18/2015), Bowel obstruction (HCC), BPH (benign prostatic hyperplasia), Chronic constipation, Chronic systolic heart failure (HCC), History of ileus, Ileus (HCC) (03/07/2017), Mental retardation, Partial bowel obstruction (HCC), Schizophrenia (HCC), SIRS (systemic inflammatory response syndrome) (HCC) (06/29/2018), and Small bowel obstruction (HCC) (06/29/2018).   Significant Hospital Events: Including procedures, antibiotic start and stop dates in addition to other pertinent events   10/5 intubated and admit to PCCM  Interim History / Subjective:  NAEON Unresponsive on 20 Prop, 150 Fent.  Objective    Blood pressure 94/74, pulse 78, temperature 97.9 F (36.6 C), resp. rate 20, height 6' 5 (1.956 m), weight 82 kg, SpO2 100%.    Vent Mode: PRVC FiO2 (%):  [40 %-60 %] 40 % Set Rate:  [20 bmp] 20 bmp Vt Set:  [710 mL] 710 mL PEEP:  [5 cmH20] 5 cmH20 Plateau Pressure:  [20 cmH20-21 cmH20] 20 cmH20   Intake/Output Summary (Last 24 hours) at 07/27/2024 0748 Last data filed at 07/27/2024 0700 Gross per 24 hour  Intake  3892.18 ml  Output 1385 ml  Net 2507.18 ml   Filed Weights   07/26/24 1725 07/26/24 1836  Weight: 82 kg 82 kg    Examination: General: chronically ill male, in NAD HENT: pinpoint reactive equal Lungs: normal effort, CTAB Cardiovascular: regular +murmur Abdomen: soft, hypoactive BS Extremities: trace edema Neuro: sedated, not responsive   Assessment and Plan    Acute resp failure Pulmonary edema vs multifocal PNA - RVP and urine strep neg Encephalopathy, presumed metabolic -Continue empiric vanc/cefepime  -follow cultures, urine legionella -Maintain full vent support with SAT/SBT as tolerated -titrate Vent setting to maintain SpO2 greater than or equal to 90% -HOB elevated 30 degrees -Plateau pressures less than 30 cm H20 -Follow chest x-ray, ABG prn.   -Bronchial hygiene and RT/bronchodilator protocol  Subacute CVA - MRI brain with multiple non-hemorrhagic embolic infarcts in bilateral cerebral cortex, left thalamus, right cerebellar hemisphere. - Neuro following, appreciate the assistance. - Start ASA, Atorvastatin - F/u on echo  Septic shock, Urinary source + possible HCAP   -broad abx as above - continue midodrine   ? NSTEMI HFrEF, end stage - with possible acute exacerbation  Elevated troponins  Chronic hypotension  - Start heparin  infusion for now - F/u on echo - Continue PTA midodrine  - Not a cath candidate  AKI Hypocalcemia Hypomagnesemia BPH  -Supportive care -1g Ca gluconate - 2g Mag -follow renal indices, UOP  Elevated LFTs - shock state - Trend LFTs intermittently  Schizophrenia  -resume meds as appropriate   GOC -previous admission reviewed, notably palliative care note 07/11/24 which indicates that legal guardian is to email DNR paperwork  required for DSS.   -it is unclear if this occurred or not.  Dr. Claudene tried to reach legal guardian 07/26/24 without success. In looking at palliative documentation, it sounds like legal guardian knows  pt is nearing EOL w end stage chronic illnesses, and was agreeable to outpt palliative care, transition to hospice based on trajectory  Will call again today 10/6  Will ask palliative to speak again as patient QoL declining and I think continued aggressive interventions and hospitalizations are prolonging suffering.  Critical care time: 30 min   Sammi Gore, [place] - [facility] [place] [facility] For pager details, please see AMION or use Epic chat  After 1900, please call [facility] for cross coverage needs 07/27/2024, 8:06 AM

## 2024-07-27 NOTE — Progress Notes (Signed)
 SLP Cancellation Note  Patient Details Name: Jordan Kennedy MRN: [id number] DOB: September 19, 1956   Cancelled treatment:       Reason Eval/Treat Not Completed: Patient not medically ready. Order received for cognitive/linguistic evaluation. Pt currently on the vent. Will f/u as able.    Leita SAILOR., M.A. CCC-SLP Acute Rehabilitation Services Office: [phone]  Secure chat preferred  07/27/2024, 8:03 AM

## 2024-07-27 NOTE — Consult Note (Signed)
 Consultation Note Date: 07/27/2024   Patient Name: Jordan Kennedy  DOB: [birth year]  MRN: [id number]  Age / Sex: 68 y.o., male  PCP: [name] HERO, MD Referring Physician: Harold Scholz, MD  Reason for Consultation: Establishing goals of care  HPI/Patient Profile: 68 y.o. male  with past medical history of  schizophrenia and intellectual disability, HFrEF with EF <20% on last echo, hx of BPH and bladder outlet obstruction with indwelling foley and multiple recent admissions and ED visits for foley obstruction and UTI's, most recent discharge 9/20  admitted on 07/26/2024 with respiratory distress and pin point pupils and was intubated in the ED. Labs significant for elevated lactic acid, troponin and WBC of 20k. CXR with patchy opacities and edema.  CTH revealed hypodensity in the anterior L MCA c/f subacute infarct.   PMT has been consulted to assist with goals of care conversation.  Legal Guardian  face treatment option decision, advance directive decisions and anticipatory care needs.   Clinical Assessment and Goals of Care:  I have reviewed medical records including EPIC notes, labs and imaging, , assessed the patient and then spoke with DSS [facility]  telephonically to discuss diagnosis prognosis, GOC, EOL wishes, disposition and options.  I visited the patient at bedside, who appeared critically ill, sedated, and intubated. He is currently receiving fentanyl  at 50 mcg/hr and propofol at 10 mcg/kg/min, and remains on full ventilatory support. I received a clinical update from the ICU nurse.  I was able to reach the patient's legal guardian, [facility], by phone. I introduced Palliative Medicine as a specialized field focused on improving quality of life for individuals with serious illnesses by addressing symptoms and stress. [name] expressed appreciation for the outreach and shared that she was unaware of the patient's hospitalization.   I  provided a detailed update, explaining that the patient was admitted on 07/26/2024 due to severe sepsis and septic shock secondary to a urinary tract infection. I also informed her of the presence of bilateral multifocal embolic strokes and the patient's current intubated status with full ventilatory support.  We discussed the seriousness of his condition. I explained that, given the extent of his current illness and underlying comorbidities, the likelihood of meaningful recovery is very low. [name] acknowledged this and shared that she had previously discussed the patient's poor prognosis with the Palliative Medicine Team. She agreed with the assessment.  I noted that the patient is currently designated as full code. I recommended considering a Do Not Resuscitate (DNR) status, given the severity of his condition and the potential risks and suffering associated with full resuscitative efforts, which are unlikely to alter the outcome. [name] agreed and provided the DSS form titled "Physician's Statement Requesting Do Not Resuscitate (DNR)/No Code Order" for completion by the medical team.  [name] clarified that the form must be signed by both the attending and a non-attending physician, and then submitted to her via email for approval by the DSS Director. She noted that the approval process may take up to 24 hours, during which time the patient will remain full code. She emphasized that she will support the medical team's recommendations as long as they are properly documented. She requested written documentation of the proposed plan of care, which may be written and included on the form.  I discussed this plan and the conversation with [facility] with Dr. Harold. The form was completed and emailed to [facility] at [employer] .[email or web address]. The current plan is to await DSS Director approval for  the DNR/No Code order. Once approved, we will update the code status and, within the next day or two, assess  readiness for withdrawal of life support. If the patient does not improve and unable to wean off vent, we will recommend compassionate extubation and a transition to comfort-focused care. This plan has been relayed to Legal Guardian [name] and she gave verbal agreement.   Update: At 1:30 PM, I re-contacted [facility] by phone. She confirmed receipt of the emailed request for DNR form and stated she will update us  once the DSS Director has signed them. I provided her with the contact number for the Palliative Medicine Team for any future communication.   Created space and opportunity for guardian to explore thoughts and feelings regarding patient's current medical condition.   Questions and concerns were addressed.    Advance Directives: None  Code Status: Full Code  Primary Decision Maker: [name] Monroe (DSS Legal Guardian).     SUMMARY OF RECOMMENDATIONS    Code Status: Maintain Full Code status until we received approval for DNR/No code order from DSS Director. Patient's legal Guardian will facilitate this.  Continue current scope of care Goal of care is medical stabilization and recovery to the extent this is possible I agree with Dr. Johnita recommendation, to allow time for outcomes, will try to get patient off life support in a day or 2, if he fails, recommend compassionate extubation and transition to comfort-focused care. Legal guardian [place] is aware of this plan and gave verbal agreement.  Continue to provide psycho-social and emotional support to patient and legal guardian Palliative medicine team will continue to follow.   Symptom Management: Per Primary team Palliative medicine is available to assist as needed.    Palliative Prophylaxis:  Aspiration, Bowel Regimen, Delirium Protocol, Eye Care, Frequent Pain Assessment, Oral Care, and Turn Reposition   Prognosis:  Prognosis is guarded due to the patient's acute medical state and cumulative disease burden. The pattern of  recurrent hospitalizations and persistent decline in health status underscores a high risk for further decompensation and poor overall outcomes.  Discharge Planning: To Be Determined      Primary Diagnoses: Present on Admission:  Respiratory distress  Shock (HCC)  Stroke (cerebrum) (HCC)  ROS: Unable to assess  Physical Exam:  Constitutional: Appears critically-ill Psych: Sedated Eyes: No scleral injection. HENT: ETT in place Head: Normocephalic.  Cardiovascular: Normal rate and regular rhythm.  Respiratory: Intubated GI: Distended but soft. Skin: dry  Vital Signs: BP 94/74   Pulse 78   Temp 97.9 F (36.6 C)   Resp 20   Ht 6' 5 (1.956 m)   Wt 82 kg   SpO2 100%   BMI 21.44 kg/m  Pain Scale: CPOT     SpO2: SpO2: 100 % O2 Device:SpO2: 100 % O2 Flow Rate: .    Palliative Assessment/Data: 10 to 30%    Total time: I spent 90 minutes in the care of the patient today in the above activities and documenting the encounter.   Detailed review of medical records (labs, imaging, vital signs), medically appropriate exam, discussed with treatment team, counseling and education to patient, family, & staff, documenting clinical information, coordination of care.     Kathlyne JULIANNA Tracie Mickey, NP  Palliative Medicine Team Team phone # [phone]  Thank you for allowing the Palliative Medicine Team to assist in the care of this patient. Please utilize secure chat with additional questions, if there is no response within 30 minutes please call the  above phone number.  Palliative Medicine Team providers are available by phone from 7am to 7pm daily and can be reached through the team cell phone.  Should this patient require assistance outside of these hours, please call the patient's attending physician.

## 2024-07-27 NOTE — Progress Notes (Addendum)
-   Patient transported to MRI and CT angio without complication. VSS, lines and drains intact throughout transport.   - On the way back to [address], patient started moving LLE spontaneously and flicker of muscle in RLE to pain. Patient now opening eyes to voice and localizing with LUE. Flicker of muscle to pain in RUE.   - Moderate amount of milky, dark red/tan discharge noted from foley insertion site  - Levophed  gtt has been off since arrival to unit @ 0015. BP's consistently running 90's/70's with MAPs in the 80's.

## 2024-07-27 NOTE — Progress Notes (Addendum)
 eLink Physician-Brief Progress Note Patient Name: Jordan Kennedy DOB: [birth year] MRN: [id number]   Date of Service  07/27/2024  HPI/Events of Note  68 year old male who has history of of schizophrenia, heart failure, chronic indwelling catheter who presents with respiratory distress and encephalopathy requiring intubation and mechanical ventilation.  Vital signs are within normal limits, saturating 100% on 40% FiO2.  Continuous propofol and fentanyl  infusions.  Low-dose norepinephrine .  Status post CAP coverage.  Results consistent with elevated creatinine, transaminitis, anion gap metabolic acidosis with mildly uptrending troponins, lactic acid dosis, leukocytosis.  CT head concerning for early a left MCA infarct  eICU Interventions  Maintain broad-spectrum antibiotics with vancomycin  and cefepime   Vasopressors as needed to maintain MAP greater than 65, ongoing crystalloid infusion  Maintain mechanical ventilation, daily spontaneous awakening/breathing trials  Stroke workup already ordered with restratification labs, echo, and angio  DVT prophylaxis with heparin  GI prophylaxis with famotidine   0335 -troponins uptrending, continue to monitor, unclear whether he would benefit from ACS heparin .  Goals of care pending  Intervention Category Evaluation Type: New Patient Evaluation  Dazaria Macneill 07/27/2024, 12:56 AM

## 2024-07-28 DIAGNOSIS — I6340 Cerebral infarction due to embolism of unspecified cerebral artery: Secondary | ICD-10-CM | POA: Diagnosis not present

## 2024-07-28 DIAGNOSIS — E785 Hyperlipidemia, unspecified: Secondary | ICD-10-CM

## 2024-07-28 DIAGNOSIS — R0603 Acute respiratory distress: Secondary | ICD-10-CM | POA: Diagnosis not present

## 2024-07-28 DIAGNOSIS — R579 Shock, unspecified: Secondary | ICD-10-CM

## 2024-07-28 DIAGNOSIS — Z515 Encounter for palliative care: Secondary | ICD-10-CM | POA: Diagnosis not present

## 2024-07-28 DIAGNOSIS — I509 Heart failure, unspecified: Secondary | ICD-10-CM

## 2024-07-28 DIAGNOSIS — I110 Hypertensive heart disease with heart failure: Secondary | ICD-10-CM

## 2024-07-28 DIAGNOSIS — R29730 NIHSS score 30: Secondary | ICD-10-CM | POA: Diagnosis not present

## 2024-07-28 DIAGNOSIS — Z7189 Other specified counseling: Secondary | ICD-10-CM

## 2024-07-28 DIAGNOSIS — I429 Cardiomyopathy, unspecified: Secondary | ICD-10-CM

## 2024-07-28 DIAGNOSIS — A419 Sepsis, unspecified organism: Secondary | ICD-10-CM

## 2024-07-28 DIAGNOSIS — I214 Non-ST elevation (NSTEMI) myocardial infarction: Secondary | ICD-10-CM

## 2024-07-28 DIAGNOSIS — I5020 Unspecified systolic (congestive) heart failure: Secondary | ICD-10-CM

## 2024-07-28 LAB — BASIC METABOLIC PANEL WITH GFR
Anion gap: 15 (ref 5–15)
BUN: 22 mg/dL (ref 8–23)
CO2: 17 mmol/L — ABNORMAL LOW (ref 22–32)
Calcium: 8.3 mg/dL — ABNORMAL LOW (ref 8.9–10.3)
Chloride: 115 mmol/L — ABNORMAL HIGH (ref 98–111)
Creatinine, Ser: 1.27 mg/dL — ABNORMAL HIGH (ref 0.61–1.24)
GFR, Estimated: >60 mL/min (ref >60)
Glucose, Bld: 89 mg/dL (ref 70–99)
Potassium: 3.5 mmol/L (ref 3.5–5.1)
Sodium: 147 mmol/L — ABNORMAL HIGH (ref 135–145)

## 2024-07-28 LAB — CBC WITH DIFFERENTIAL/PLATELET
Basophils Absolute: 0.2 K/uL — ABNORMAL HIGH (ref 0.0–0.1)
Basophils Relative: 1 %
Eosinophils Absolute: 0.4 K/uL (ref 0.0–0.5)
Eosinophils Relative: 2 %
HCT: 33.2 % — ABNORMAL LOW (ref 39.0–52.0)
Hemoglobin: 10.8 g/dL — ABNORMAL LOW (ref 13.0–17.0)
Lymphocytes Relative: 7 %
Lymphs Abs: 1.4 K/uL (ref 0.7–4.0)
MCH: 30.3 pg (ref 26.0–34.0)
MCHC: 32.5 g/dL (ref 30.0–36.0)
MCV: 93 fL (ref 80.0–100.0)
Monocytes Absolute: 1.8 K/uL — ABNORMAL HIGH (ref 0.1–1.0)
Monocytes Relative: 9 %
Neutro Abs: 16.5 K/uL — ABNORMAL HIGH (ref 1.7–7.7)
Neutrophils Relative %: 81 %
Platelets: 226 K/uL (ref 150–400)
RBC: 3.57 MIL/uL — ABNORMAL LOW (ref 4.22–5.81)
RDW: 21.5 % — ABNORMAL HIGH (ref 11.5–15.5)
WBC: 20.4 K/uL — ABNORMAL HIGH (ref 4.0–10.5)
nRBC: 0.7 % — ABNORMAL HIGH (ref 0.0–0.2)

## 2024-07-28 LAB — CBC
HCT: 32.6 % — ABNORMAL LOW (ref 39.0–52.0)
Hemoglobin: 10.6 g/dL — ABNORMAL LOW (ref 13.0–17.0)
MCH: 30.2 pg (ref 26.0–34.0)
MCHC: 32.5 g/dL (ref 30.0–36.0)
MCV: 92.9 fL (ref 80.0–100.0)
Platelets: 219 K/uL (ref 150–400)
RBC: 3.51 MIL/uL — ABNORMAL LOW (ref 4.22–5.81)
RDW: 21.5 % — ABNORMAL HIGH (ref 11.5–15.5)
WBC: 17.8 K/uL — ABNORMAL HIGH (ref 4.0–10.5)
nRBC: 0.9 % — ABNORMAL HIGH (ref 0.0–0.2)

## 2024-07-28 LAB — HEPARIN LEVEL (UNFRACTIONATED)
Heparin Unfractionated: 0.12 [IU]/mL — ABNORMAL LOW (ref 0.30–0.70)
Heparin Unfractionated: 0.18 [IU]/mL — ABNORMAL LOW (ref 0.30–0.70)

## 2024-07-28 LAB — GLUCOSE, CAPILLARY
Glucose-Capillary: 78 mg/dL (ref 70–99)
Glucose-Capillary: 79 mg/dL (ref 70–99)
Glucose-Capillary: 86 mg/dL (ref 70–99)
Glucose-Capillary: 87 mg/dL (ref 70–99)
Glucose-Capillary: 89 mg/dL (ref 70–99)
Glucose-Capillary: 90 mg/dL (ref 70–99)

## 2024-07-28 LAB — PHOSPHORUS: Phosphorus: 2.9 mg/dL (ref 2.5–4.6)

## 2024-07-28 LAB — MAGNESIUM: Magnesium: 1.6 mg/dL — ABNORMAL LOW (ref 1.7–2.4)

## 2024-07-28 MED ORDER — CLOZAPINE 100 MG PO TABS
100.0000 mg | ORAL_TABLET | Freq: Every day | ORAL | Status: DC
  Filled 2024-07-28: qty 1

## 2024-07-28 MED ORDER — MIDAZOLAM HCL 2 MG/2ML IJ SOLN
2.0000 mg | INTRAMUSCULAR | Status: DC | PRN
Start: 2024-07-28 — End: 2024-07-30
  Administered 2024-07-29 – 2024-07-30 (×4): 2 mg via INTRAVENOUS
  Filled 2024-07-28 (×5): qty 2

## 2024-07-28 MED ORDER — PAROXETINE HCL 20 MG PO TABS
40.0000 mg | ORAL_TABLET | Freq: Every day | ORAL | Status: DC
  Administered 2024-07-28 – 2024-08-01 (×4): 40 mg
  Filled 2024-07-28 (×5): qty 2

## 2024-07-28 MED ORDER — MAGNESIUM SULFATE 4 GM/100ML IV SOLN
4.0000 g | Freq: Once | INTRAVENOUS | Status: AC
  Administered 2024-07-28: 4 g via INTRAVENOUS
  Filled 2024-07-28: qty 100

## 2024-07-28 MED ORDER — POTASSIUM CHLORIDE 20 MEQ PO PACK
60.0000 meq | PACK | Freq: Once | ORAL | Status: AC
  Administered 2024-07-28: 60 meq
  Filled 2024-07-28: qty 3

## 2024-07-28 MED ORDER — FENTANYL CITRATE PF 50 MCG/ML IJ SOSY
50.0000 ug | PREFILLED_SYRINGE | INTRAMUSCULAR | Status: DC | PRN
Start: 2024-07-28 — End: 2024-07-30
  Administered 2024-07-28 – 2024-07-29 (×4): 50 ug via INTRAVENOUS
  Filled 2024-07-28 (×4): qty 1

## 2024-07-28 MED ORDER — NOREPINEPHRINE 4 MG/250ML-% IV SOLN
INTRAVENOUS | Status: AC
  Filled 2024-07-28: qty 250

## 2024-07-28 MED ORDER — POLYETHYLENE GLYCOL 3350 17 G PO PACK
17.0000 g | PACK | Freq: Every day | ORAL | Status: DC
  Administered 2024-07-29 – 2024-08-01 (×3): 17 g
  Filled 2024-07-28 (×3): qty 1

## 2024-07-28 NOTE — Progress Notes (Signed)
 NAME:  Jordan Kennedy, MRN:  [id number], DOB:  October 02, 1956, LOS: 2 ADMISSION DATE:  07/26/2024, CONSULTATION DATE:  07/28/24 REFERRING MD:  EDP, CHIEF COMPLAINT:  respiratory distress    History of Present Illness:  Jordan Kennedy is a 67 y.o. M with PMH schizophrenia and intellectual disability, HFrEF with EF <20% on last echo, hx of BPH and bladder outlet obstruction with indwelling foley and multiple recent admissions and ED visits for foley obstruction and UTI's, most recent discharge 9/20 who presented with respiratory distress and pin point pupils  and was intubated in the ED. Labs significant for elevated lactic acid, troponin and WBC of 20k. CXR with patchy opacities and edema and CT head pending.  PCCM consulted for admission.   Pertinent  Medical History   has a past medical history of AKI (acute kidney injury) (06/29/2018), Anemia, Anemia, iron deficiency (09/18/2015), Bowel obstruction (HCC), BPH (benign prostatic hyperplasia), Chronic constipation, Chronic systolic heart failure (HCC), History of ileus, Ileus (HCC) (03/07/2017), Mental retardation, Partial bowel obstruction (HCC), Schizophrenia (HCC), SIRS (systemic inflammatory response syndrome) (HCC) (06/29/2018), and Small bowel obstruction (HCC) (06/29/2018).   Significant Hospital Events: Including procedures, antibiotic start and stop dates in addition to other pertinent events   10/5 intubated and admit to PCCM  Interim History / Subjective:  NAEON Does open eyes to voice this AM with sedation lowered, currently on 10 Prop A bit febrile early this AM to 101.1. Blood cultures with staph species. On Zosyn   Objective    Blood pressure 96/74, pulse 87, temperature (!) 101.1 F (38.4 C), resp. rate (!) 21, height 6' 5 (1.956 m), weight 84.8 kg, SpO2 100%.    Vent Mode: PRVC FiO2 (%):  [40 %] 40 % Set Rate:  [20 bmp] 20 bmp Vt Set:  [710 mL] 710 mL PEEP:  [5 cmH20] 5 cmH20 Plateau Pressure:  [20 cmH20-29 cmH20] 24 cmH20    Intake/Output Summary (Last 24 hours) at 07/28/2024 0752 Last data filed at 07/28/2024 0700 Gross per 24 hour  Intake 1823.77 ml  Output 4050 ml  Net -2226.23 ml   Filed Weights   07/26/24 1725 07/26/24 1836 07/28/24 0500  Weight: 82 kg 82 kg 84.8 kg    Examination: General: chronically ill male, in NAD HENT: pinpoint reactive equal. ETT in place Lungs: normal effort, CTAB Cardiovascular: regular +murmur Abdomen: soft, hypoactive BS Extremities: trace edema Neuro: sedated, will open eyes to noxious stimuli and voice. Per RN, when sedation lowered and off he will follow commands with LE's, LUE but not RUE   Assessment and Plan    Acute resp failure Pulmonary edema vs multifocal PNA - RVP and urine strep neg Encephalopathy, presumed metabolic -Continue empiric Zosyn  (changed from cefepime  10/6 and vanc dc) -follow cultures through completion (1 bottle blood growing staph species 10/6) -Maintain full vent support with SAT/SBT as tolerated -titrate Vent setting to maintain SpO2 greater than or equal to 90% -HOB elevated 30 degrees -Plateau pressures less than 30 cm H20 -Follow chest x-ray, ABG prn -Bronchial hygiene and RT/bronchodilator protocol  Subacute CVA - MRI brain with multiple non-hemorrhagic embolic infarcts in bilateral cerebral cortex, left thalamus, right cerebellar hemisphere. - Neuro following, appreciate the assistance. - continue ASA, Atorvastatin  Septic shock, Urinary source + possible HCAP. 1 bottle blood growing staph   -continue zosyn  - continue midodrine   NSTEMI HFrEF, end stage - echo 10/6 with EF <20% and GIIDD. Elevated troponins  Chronic hypotension  - Continue heparin  infusion - Continue PTA midodrine  -  Not a cath candidate  AKI Hypocalcemia Hypomagnesemia BPH  -Supportive care -awaiting daily labs -follow renal indices, UOP  Elevated LFTs - shock state - Trend LFTs intermittently  Schizophrenia  -resume meds as appropriate    GOC -previous admission reviewed, notably palliative care note 07/11/24 which indicates that legal guardian is to email DNR paperwork required for DSS.   -PMT assistance appreciated, they have contacted DSS and legal guardian. DNR paperwork provided 10/6 and they have confirmed receipt. Director to review and sign off today 10/7. Will consider 1 way extubation once optimized thereafter.  Critical care time: 30 min   Sammi Gore, [place] - [facility] [place] [facility] For pager details, please see AMION or use Epic chat  After 1900, please call [facility] for cross coverage needs 07/28/2024, 7:52 AM

## 2024-07-28 NOTE — TOC CM/SW Note (Signed)
 Transition of Care [facility]) - Inpatient Brief Assessment   Patient Details  Name: Jordan Kennedy MRN: [id number] Date of Birth: [birth year]  [facility] CM/SW Contact:    Arleen Bar M, RN Phone Number: 07/28/2024, 4:47 PM   Clinical Narrative: 68 year old male here with severe sepsis with septic shock in the setting of UTI and bilateral multifocal pneumonia, complicated with bilateral embolic strokes. Palliative medicine team consulted to assist with goals of care. Patient has legal guardian; he remains a full code currently.   Inpatient Care Manager will continue to follow progress.     Transition of Care Asessment: Insurance and Status: Insurance coverage has been reviewed Patient has primary care physician: Yes Jordan Kennedy) Home environment has been reviewed: From [facility] Prior level of function:: Needs assistance Prior/Current Home Services: No current home services Social Drivers of Health Review: SDOH reviewed needs interventions Readmission risk has been reviewed: Yes Transition of care needs: transition of care needs identified, TOC will continue to follow  Mliss MICAEL Fass, RN, BSN  Trauma/Neuro ICU Case Manager [phone]

## 2024-07-28 NOTE — Progress Notes (Signed)
 PHARMACY - ANTICOAGULATION CONSULT NOTE  Pharmacy Consult for heparin   Indication: chest pain/ACS and stroke  No Known Allergies  Patient Measurements: Height: 6' 5 (195.6 cm) Weight: 84.8 kg (186 lb 15.2 oz) IBW/kg (Calculated) : 89.1 HEPARIN  DW (KG): 82  Vital Signs: Temp: 97.9 F (36.6 C) (10/07 1600) Temp Source: Bladder (10/07 0800) BP: 85/63 (10/07 1600) Pulse Rate: 80 (10/07 1600)  Labs: Recent Labs    07/26/24 1730 07/26/24 1810 07/26/24 1943 07/27/24 0116 07/27/24 0620 07/27/24 0950 07/27/24 1225 07/27/24 2013 07/28/24 0858 07/28/24 1046  HGB 11.2*   < > 10.5*  --  9.7*  --   --   --  10.6* 10.8*  HCT 36.4*   < > 31.0*  --  30.2*  --   --   --  32.6* 33.2*  PLT 307   < >  --   --  238  --   --   --  219 226  LABPROT 15.7*  --   --   --   --   --   --   --   --   --   INR 1.2  --   --   --   --   --   --   --   --   --   HEPARINUNFRC  --   --   --   --   --   --   --  <0.10* 0.12*  --   CREATININE 1.91*   < > 1.90*  --  1.34*  --   --   --  1.27*  --   TROPONINIHS 1,348*   < >  --    < > 4,514* 4,607* 4,282*  --   --   --    < > = values in this interval not displayed.    Estimated Creatinine Clearance: 69.6 mL/min (A) (by C-G formula based on SCr of 1.27 mg/dL (H)).   Medical History: Past Medical History:  Diagnosis Date   AKI (acute kidney injury) 06/29/2018   Anemia    Anemia, iron deficiency 09/18/2015   Bowel obstruction (HCC)    BPH (benign prostatic hyperplasia)    Chronic constipation    Chronic systolic heart failure (HCC)    History of ileus    Ileus (HCC) 03/07/2017   Mental retardation    Partial bowel obstruction (HCC)    Schizophrenia (HCC)    Monarche every three months;    SIRS (systemic inflammatory response syndrome) (HCC) 06/29/2018   Small bowel obstruction (HCC) 06/29/2018    Medications:  Infusions:   heparin  1,300 Units/hr (07/28/24 1500)   norepinephrine      piperacillin -tazobactam (ZOSYN )  IV 12.5 mL/hr at  07/28/24 1500   Assessment: Patient is a 68 y/o  male admitted for respiratory distress requiring intubation. Pharmacy is consulted for heparin  dosing for NSTEMI and stroke.  Keeping heparin  gtt with low EF.  10/7 PM - heparin  level 0.18 remains subtherapeutic after rate increase to 1300 units/hr.  CBC stable today (Hgb 10.8, pltc 226).  No issues per RN.   Goal of Therapy:  Heparin  level 0.3-0.5 units/ml Monitor platelets by anticoagulation protocol: Yes   Plan:  Increase heparin  IV to 1450 units/hr  No bolus per protocol  6h heparin  level  Maurilio Fila, PharmD Clinical Pharmacist 07/28/2024  4:36 PM

## 2024-07-28 NOTE — Progress Notes (Signed)
 PHARMACY - ANTICOAGULATION CONSULT NOTE  Pharmacy Consult for heparin   Indication: chest pain/ACS and stroke  No Known Allergies  Patient Measurements: Height: 6' 5 (195.6 cm) Weight: 84.8 kg (186 lb 15.2 oz) IBW/kg (Calculated) : 89.1 HEPARIN  DW (KG): 82  Vital Signs: Temp: 101.1 F (38.4 C) (10/07 0700) Temp Source: Bladder (10/07 0100) BP: 96/74 (10/07 0600) Pulse Rate: 87 (10/07 0700)  Labs: Recent Labs    07/26/24 1730 07/26/24 1810 07/26/24 1927 07/26/24 1943 07/27/24 0116 07/27/24 0620 07/27/24 0950 07/27/24 1225 07/27/24 2013 07/28/24 0858  HGB 11.2*   < > 9.6* 10.5*  --  9.7*  --   --   --  10.6*  HCT 36.4*   < > 31.1* 31.0*  --  30.2*  --   --   --  32.6*  PLT 307  --  261  --   --  238  --   --   --  219  LABPROT 15.7*  --   --   --   --   --   --   --   --   --   INR 1.2  --   --   --   --   --   --   --   --   --   HEPARINUNFRC  --   --   --   --   --   --   --   --  <0.10* 0.12*  CREATININE 1.91*   < > 1.91* 1.90*  --  1.34*  --   --   --   --   TROPONINIHS 1,348*  --  1,442*  --    < > 4,514* 4,607* 4,282*  --   --    < > = values in this interval not displayed.    Estimated Creatinine Clearance: 65.9 mL/min (A) (by C-G formula based on SCr of 1.34 mg/dL (H)).   Medical History: Past Medical History:  Diagnosis Date   AKI (acute kidney injury) 06/29/2018   Anemia    Anemia, iron deficiency 09/18/2015   Bowel obstruction (HCC)    BPH (benign prostatic hyperplasia)    Chronic constipation    Chronic systolic heart failure (HCC)    History of ileus    Ileus (HCC) 03/07/2017   Mental retardation    Partial bowel obstruction (HCC)    Schizophrenia (HCC)    Monarche every three months;    SIRS (systemic inflammatory response syndrome) (HCC) 06/29/2018   Small bowel obstruction (HCC) 06/29/2018    Medications:  Infusions:   fentaNYL  infusion INTRAVENOUS 100 mcg/hr (07/28/24 0700)   heparin  1,150 Units/hr (07/28/24 0700)    piperacillin -tazobactam (ZOSYN )  IV 12.5 mL/hr at 07/28/24 0700   propofol (DIPRIVAN) infusion 20 mcg/kg/min (07/28/24 0700)   Assessment: Patient is a 68 y/o  male admitted for respiratory distress requiring intubation. Pharmacy is consulted for heparin  dosing for NSTEMI and stroke. Hgb 20.6/PLT 219. Troponin elevated but trending down (cTn-hs 4607 peak >> 4282).    Heparin  level is subtherapeutic at 0.12. No issues per RN.   Goal of Therapy:  Heparin  level 0.3-0.5 units/ml Monitor platelets by anticoagulation protocol: Yes   Plan:  Increase heparin  IV to 1300 units/hr  No bolus per protocol  Heparin  level in 6 hours   Vermell Mccallum, PharmD 07/28/2024  9:56 AM

## 2024-07-28 NOTE — Progress Notes (Addendum)
 Daily Progress Note   Patient Name: Jordan Kennedy       Date: 07/28/2024 DOB: December 17, 1955  Age: 68 y.o. MRN#: [id number] Attending Physician: Jordan Scholz, MD Primary Care Physician: Jordan Garnette HERO, MD Admit Date: 07/26/2024  Reason for Consultation/Follow-up: Establishing goals of care  Medical records reviewed including progress notes, labs and imaging.  Fever coming down.  Sodium up at 147 from 141 yesterday.  WBC increased to 17.8 from 14.9.    Subjective: Patient assessed at the bedside.  Intubated and sedated.  Discussed with RN and primary attending.  PMT received call from legal guardian this morning, updated that a different DNR form would need to be completed as the incorrect form was sent yesterday.  Coordinated with notary and primary attending to complete DNR form and email back to her.  Will continue to assess for readiness for extubation versus need for compassionate extubation and transition to comfort focused care over the next couple days.  Addendum: received update from RN about tenative extubation. Called patient's legal guardian to share and inquire about today's new DNR form. She is still working on getting it signed by her Jordan Kennedy. She also notes that patient's sister will be visiting later today and that she hoped they could come by - another of patient's sisters just buried her daughter recently and has not been updated about Jordan Kennedy yet. She will let me know when the form is approved and understands plan to extubate as medically appropriate with possible comfort care if he tolerates poorly.  Questions and concerns addressed. PMT will continue to support holistically.   Length of Stay: 2   Physical Exam Vitals and nursing note reviewed.  Constitutional:      General: He is not in acute distress.    Appearance:  He is ill-appearing.     Interventions: He is intubated.  Cardiovascular:     Rate and Rhythm: Normal rate.  Pulmonary:     Effort: He is intubated.  Neurological:     Comments: Sedated            Vital Signs: BP 96/74   Pulse 87   Temp (!) 101.1 F (38.4 C)   Resp (!) 21   Ht 6' 5 (1.956 m)   Wt 84.8 kg   SpO2 100%   BMI 22.17 kg/m  SpO2: SpO2: 100 % O2 Device: O2 Device: Ventilator O2 Flow Rate:        Palliative Assessment/Data: 10%   Palliative Care Assessment & Plan   Patient Profile: 68 y.o. male  with past medical history of  schizophrenia and intellectual disability, HFrEF  with EF <20% on last echo, hx of BPH and bladder outlet obstruction with indwelling foley and multiple recent admissions and ED visits for foley obstruction and UTI's, most recent discharge 9/20  admitted on 07/26/2024 with respiratory distress and pin point pupils and was intubated in the ED. Labs significant for elevated lactic acid, troponin and WBC of 20k. CXR with patchy opacities and edema.  CTH revealed hypodensity in the anterior L MCA c/f subacute infarct.    PMT has been consulted to assist with goals of care conversation as Legal Guardian and patient face treatment options, complex medical decisions, and anticipatory care needs.  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  Sent new DNR documentation to legal guardian today per her request Continue current care plan for now Will give more time for outcomes over the next day or so and attempt to optimize for extubation.  Recommending one-way extubation and comfort focused care if unable to wean from life support PMT will continue to  follow and support   Prognosis: Poor long-term prognosis with several acute illnesses and chronic comorbidities, recurrent hospitalizations, functional decline  Discharge Planning: To Be Determined  Care plan was discussed with patient's RN and attending         Jordan Luczak SHAUNNA Fell, PA-C  Palliative Medicine Team Team phone # [phone]  Thank you for allowing the Palliative Medicine Team to assist in the care of this patient. Please utilize secure chat with additional questions, if there is no response within 30 minutes please call the above phone number.  Palliative Medicine Team providers are available by phone from 7am to 7pm daily and can be reached through the team cell phone.  Should this patient require assistance outside of these hours, please call the patient's attending physician.    Time Total: 50  Visit consisted of counseling and education dealing with the complex and emotionally intense issues of symptom management and palliative care in the setting of serious and potentially life-threatening illness. Greater than 50% of this time was spent counseling and coordinating care related to the above assessment and plan.  Personally spent 50 minutes in patient care including extensive chart review (labs, imaging, progress/consult notes, vital signs), medically appropraite exam, discussed with treatment team, education to patient, family, and staff, documenting clinical information, medication review and management, coordination of care, and available advanced directive documents.

## 2024-07-28 NOTE — Progress Notes (Signed)
 OT Cancellation Note  Patient Details Name: Jordan Kennedy MRN: [id number] DOB: 30-Nov-1955   Cancelled Treatment:    Reason Eval/Treat Not Completed: Active bedrest order, bed rest and intubated.  OT will follow and see as appropriate.   Etta NOVAK, OT Acute Rehabilitation Services Office [phone] Secure Chat Preferred    Etta GORMAN Hope 07/28/2024, 8:14 AM

## 2024-07-28 NOTE — Progress Notes (Signed)
 STROKE TEAM PROGRESS NOTE    SIGNIFICANT HOSPITAL EVENTS: 10/5 - Admitted for altered mental status, pinpoint pupils, and respiratory difficulty, requiring intubation in the ED. Patient also noted to have low-grade fever, low BP with findings concerning for evolving sepsis in the setting of UTI. He was started on IV fluids and empiric antibiotics. Labs significant for elevated lactic acid, troponin and WBC of 20k. CXR with patchy opacities and edema.  CT Head showed early subacute infarct within the anterior left MCA territory.   MRI brain showed multiple acute non-hemorrhagic embolic infarcts involving the bilateral cerebral cortex, left thalamus, and right cerebellar hemisphere.  INTERIM HISTORY/SUBJECTIVE: Patient currently intubated and sedated and neurological exam is unchanged.  He barely opens eyes to noxious stimuli but does not follow any commands.  Moves left side greater than right side semipurposefully.. No family at bedside.    OBJECTIVE  CBC    Component Value Date/Time   WBC 20.4 (H) 07/28/2024 1046   RBC 3.57 (L) 07/28/2024 1046   HGB 10.8 (L) 07/28/2024 1046   HCT 33.2 (L) 07/28/2024 1046   PLT 226 07/28/2024 1046   MCV 93.0 07/28/2024 1046   MCV 95.6 11/04/2014 1744   MCH 30.3 07/28/2024 1046   MCHC 32.5 07/28/2024 1046   RDW 21.5 (H) 07/28/2024 1046   LYMPHSABS 1.4 07/28/2024 1046   MONOABS 1.8 (H) 07/28/2024 1046   EOSABS 0.4 07/28/2024 1046   BASOSABS 0.2 (H) 07/28/2024 1046    BMET    Component Value Date/Time   NA 147 (H) 07/28/2024 0858   NA 143 05/09/2023 1146   K 3.5 07/28/2024 0858   CL 115 (H) 07/28/2024 0858   CO2 17 (L) 07/28/2024 0858   GLUCOSE 89 07/28/2024 0858   BUN 22 07/28/2024 0858   BUN 17 05/09/2023 1146   CREATININE 1.27 (H) 07/28/2024 0858   CREATININE 0.92 09/14/2015 1637   CALCIUM  8.3 (L) 07/28/2024 0858   EGFR 62 05/09/2023 1146   GFRNONAA >60 07/28/2024 0858   GFRNONAA >89 09/14/2015 1637    IMAGING past 24 hours No  results found.   Vitals:   07/28/24 0923 07/28/24 1000 07/28/24 1100 07/28/24 1244  BP:  102/72 93/65   Pulse: 80 80 89 80  Resp: 20 20 (!) 8 12  Temp: 99.7 F (37.6 C) 99.5 F (37.5 C) 99.3 F (37.4 C) 98.4 F (36.9 C)  TempSrc:      SpO2: 100% 100% 100% 100%  Weight:      Height: 6' 5 (1.956 m)        PHYSICAL EXAM: General: Orally intubated and sedated CV: Regular rate and rhythm on monitor Respiratory: Regular, unlabored respirations on ventilator   NEURO: Patient is sedated and intubated on vent. Does not follow commands. Pupils 2 mm, round, equal and non-reactive. Corneal reflex intact bilaterally. Oculocephalic reflex not present. Gag/cough reflex intact. Non-purposeful movement of all 4 extremities in response to noxious stimuli.  Most Recent NIH: 30   ASSESSMENT/PLAN  Mr. Jordan Kennedy is a 68 y.o. male with history of schizophrenia, tobacco use disorder, HFrEF (<20%), chronic indwelling foley, intellectual disability admitted for respiratory distress, pinpoint pupils, altered mental status and intubated in the ED. NIH on Admission 26. CT head revealed hypodensity in the anterior L MCA c/f subacute infarct. Echo with several risk factors for atrial fibrillation including several bi-atrial dilation and EF <20%, would consider likely underlying cardioembolic etiology of multiple infarcts.   Acute Ischemic Infarct:  Multiple acute ischemic infarcts Etiology:  cryptogenic Code Stroke CT head: probable early subacute infarct within the anterior left MCA territory. ASPECTS 9.    CTA head & neck: No significant stenosis of the internal carotid arteries. The A1 segments are mildly irregular in contour. The M1 segments of the middle cerebral arteries are irregular in contour and demonstrate diffuse mild-to-moderate stenosis. There are moderate irregular stenoses of the proximal M2 branches of the middle cerebral arteries bilaterally, slightly worse on the right. The M2  branches appeared diffusely diminutive and irregular. MRI: Multiple acute non-hemorrhagic embolic infarcts involving the bilateral cerebral cortex, left thalamus, and right cerebellar hemisphere. 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  2D Echo: LVEF <20%, G2DD, severe dilation of LA and RA, normal MV LDL 58 HgbA1c 6.2 VTE prophylaxis - Heparin  infusion Patient prescribed aspirin ,  but reported not taking prior to admission, now on aspirin  81 mg daily and heparin  IV  Therapy recommendations:  Pending Disposition:  Pending  Hx of Stroke/TIA No prior stroke history  Blood Pressure Home meds: Hold Entresto  24-26 mg, metoprolol  succinate Soft BP's currently; on Midodrine  Blood Pressure Goal: BP less than 220/110   Hyperlipidemia Home meds:  None LDL 58, goal < 70 Continue atorvastatin 80 mg daily  Continue statin at discharge  Diabetes type II Controlled Home meds:  Farxiga  HgbA1c 6.2, goal < 7.0 CBGs SSI Recommend close follow-up with PCP for better DM control  Dysphagia Patient has post-stroke dysphagia, SLP consulted    Diet   Diet NPO time specified    Other Stroke Risk Factors Congestive heart failure  Other Active Problems (Managed per PCCM) Acute resp failure / Pulmonary edema vs multifocal PNA - RVP and urine strep neg /Encephalopathy, presumed metabolic Septic shock, Urinary source + possible HCAP  AKI / Hypocalcemia / Hypomagnesemia / BPH Elevated LFTs Schizophrenia   Hospital day # 2 Patient presented with sepsis and UTI and altered mental status and MRI scan shows multiple embolic strokes likely related to his cardiomyopathy with low EF. Exam is limited due to sedation and intubation.  Recommend continue IV heparin  for stroke prevention given his cardiomyopathy and low ejection fraction..  Wean off ventilatory support as tolerated.  There is no family available at the bedside for discussion.  Discussed with Dr. Harold critical care  medicine. This patient is critically ill and at significant risk of neurological worsening, death and care requires constant monitoring of vital signs, hemodynamics,respiratory and cardiac monitoring, extensive review of multiple databases, frequent neurological assessment, discussion with family, other specialists and medical decision making of high complexity.I have made any additions or clarifications directly to the above note.This critical care time does not reflect procedure time, or teaching time or supervisory time of PA/NP/Med Resident etc but could involve care discussion time.  I spent 30 minutes of neurocritical care time  in the care of  this patient.         Eather Popp, MD Medical Director [facility] Pager: [phone] 07/28/2024 12:52 PM   To contact Stroke Continuity provider, please refer to [email or web address]. After hours, contact General Neurology

## 2024-07-28 NOTE — Progress Notes (Signed)
 PT Cancellation Note  Patient Details Name: Jordan Kennedy MRN: [id number] DOB: 1956-10-11   Cancelled Treatment:    Reason Eval/Treat Not Completed: Active bedrest order  On bedrest and ventilated. Will follow for appropriateness to proceed with eval   [name], PT Acute Rehabilitation Services  Office [phone]  [name] 07/28/2024, 8:11 AM

## 2024-07-28 NOTE — Progress Notes (Addendum)
 eLink Physician-Brief Progress Note Patient Name: Jordan Kennedy DOB: 07-13-1956 MRN: [id number]   Date of Service  07/28/2024  HPI/Events of Note   woke up and reaching for ETT with L hand  eICU Interventions  LUE restraints for patient safety   2217 - more awake now and reaching for ETT with R hand now, switch to BL restraints. Add PRN analgesia and sedation for RASS goal 0 to -1  Intervention Category Minor Interventions: Agitation / anxiety - evaluation and management  Jordan Kennedy 07/28/2024, 9:24 PM

## 2024-07-29 DIAGNOSIS — I639 Cerebral infarction, unspecified: Secondary | ICD-10-CM | POA: Diagnosis not present

## 2024-07-29 DIAGNOSIS — E43 Unspecified severe protein-calorie malnutrition: Secondary | ICD-10-CM | POA: Insufficient documentation

## 2024-07-29 DIAGNOSIS — Z7189 Other specified counseling: Secondary | ICD-10-CM | POA: Diagnosis not present

## 2024-07-29 DIAGNOSIS — I429 Cardiomyopathy, unspecified: Secondary | ICD-10-CM | POA: Diagnosis not present

## 2024-07-29 DIAGNOSIS — I6340 Cerebral infarction due to embolism of unspecified cerebral artery: Secondary | ICD-10-CM | POA: Diagnosis not present

## 2024-07-29 DIAGNOSIS — Z515 Encounter for palliative care: Secondary | ICD-10-CM | POA: Diagnosis not present

## 2024-07-29 DIAGNOSIS — R29730 NIHSS score 30: Secondary | ICD-10-CM | POA: Diagnosis not present

## 2024-07-29 DIAGNOSIS — I110 Hypertensive heart disease with heart failure: Secondary | ICD-10-CM | POA: Diagnosis not present

## 2024-07-29 LAB — BASIC METABOLIC PANEL WITH GFR
Anion gap: 11 (ref 5–15)
BUN: 18 mg/dL (ref 8–23)
CO2: 19 mmol/L — ABNORMAL LOW (ref 22–32)
Calcium: 8.2 mg/dL — ABNORMAL LOW (ref 8.9–10.3)
Chloride: 113 mmol/L — ABNORMAL HIGH (ref 98–111)
Creatinine, Ser: 1.34 mg/dL — ABNORMAL HIGH (ref 0.61–1.24)
GFR, Estimated: 58 mL/min — ABNORMAL LOW (ref >60)
Glucose, Bld: 97 mg/dL (ref 70–99)
Potassium: 4 mmol/L (ref 3.5–5.1)
Sodium: 143 mmol/L (ref 135–145)

## 2024-07-29 LAB — CULTURE, BLOOD (ROUTINE X 2): Special Requests: ADEQUATE

## 2024-07-29 LAB — CBC
HCT: 32.5 % — ABNORMAL LOW (ref 39.0–52.0)
Hemoglobin: 10.4 g/dL — ABNORMAL LOW (ref 13.0–17.0)
MCH: 30.1 pg (ref 26.0–34.0)
MCHC: 32 g/dL (ref 30.0–36.0)
MCV: 94.2 fL (ref 80.0–100.0)
Platelets: 231 K/uL (ref 150–400)
RBC: 3.45 MIL/uL — ABNORMAL LOW (ref 4.22–5.81)
RDW: 21.4 % — ABNORMAL HIGH (ref 11.5–15.5)
WBC: 19.1 K/uL — ABNORMAL HIGH (ref 4.0–10.5)
nRBC: 0.1 % (ref 0.0–0.2)

## 2024-07-29 LAB — URINE CULTURE: Culture: 60000 — AB

## 2024-07-29 LAB — GLUCOSE, CAPILLARY
Glucose-Capillary: 102 mg/dL — ABNORMAL HIGH (ref 70–99)
Glucose-Capillary: 101 mg/dL — ABNORMAL HIGH (ref 70–99)
Glucose-Capillary: 106 mg/dL — ABNORMAL HIGH (ref 70–99)
Glucose-Capillary: 108 mg/dL — ABNORMAL HIGH (ref 70–99)
Glucose-Capillary: 96 mg/dL (ref 70–99)

## 2024-07-29 LAB — LEGIONELLA PNEUMOPHILA SEROGP 1 UR AG: L. pneumophila Serogp 1 Ur Ag: NEGATIVE

## 2024-07-29 LAB — HEPARIN LEVEL (UNFRACTIONATED)
Heparin Unfractionated: 0.21 [IU]/mL — ABNORMAL LOW (ref 0.30–0.70)
Heparin Unfractionated: 0.22 [IU]/mL — ABNORMAL LOW (ref 0.30–0.70)
Heparin Unfractionated: 0.23 [IU]/mL — ABNORMAL LOW (ref 0.30–0.70)

## 2024-07-29 LAB — PHOSPHORUS: Phosphorus: 2.7 mg/dL (ref 2.5–4.6)

## 2024-07-29 LAB — MAGNESIUM: Magnesium: 2.3 mg/dL (ref 1.7–2.4)

## 2024-07-29 MED ORDER — OSMOLITE 1.5 CAL PO LIQD
1000.0000 mL | ORAL | Status: DC
Start: 2024-07-29 — End: 2024-07-29

## 2024-07-29 MED ORDER — MIDODRINE HCL 5 MG PO TABS
5.0000 mg | ORAL_TABLET | Freq: Three times a day (TID) | ORAL | Status: DC
  Administered 2024-07-29 – 2024-07-30 (×4): 5 mg
  Filled 2024-07-29 (×4): qty 1

## 2024-07-29 MED ORDER — OSMOLITE 1.5 CAL PO LIQD
1000.0000 mL | ORAL | Status: DC
  Administered 2024-07-29: 1000 mL
  Filled 2024-07-29 (×2): qty 1000

## 2024-07-29 MED ORDER — PROSOURCE TF20 ENFIT COMPATIBL EN LIQD
60.0000 mL | Freq: Two times a day (BID) | ENTERAL | Status: DC
  Administered 2024-07-29: 60 mL
  Filled 2024-07-29: qty 60

## 2024-07-29 MED ORDER — SODIUM CHLORIDE 0.9 % IV SOLN
1.0000 g | Freq: Three times a day (TID) | INTRAVENOUS | Status: AC
  Administered 2024-07-29 – 2024-08-05 (×20): 1 g via INTRAVENOUS
  Filled 2024-07-29 (×22): qty 20

## 2024-07-29 MED ORDER — PROSOURCE TF20 ENFIT COMPATIBL EN LIQD
60.0000 mL | Freq: Two times a day (BID) | ENTERAL | Status: DC
  Administered 2024-07-29 – 2024-08-01 (×4): 60 mL
  Filled 2024-07-29 (×5): qty 60

## 2024-07-29 MED ORDER — THIAMINE MONONITRATE 100 MG PO TABS
100.0000 mg | ORAL_TABLET | Freq: Every day | ORAL | Status: DC
Start: 2024-07-29 — End: 2024-08-02
  Administered 2024-07-29 – 2024-08-01 (×3): 100 mg
  Filled 2024-07-29 (×3): qty 1

## 2024-07-29 MED ORDER — PROSOURCE TF20 ENFIT COMPATIBL EN LIQD: 60.0000 mL | Freq: Two times a day (BID) | ENTERAL | Status: DC

## 2024-07-29 MED ORDER — LACTULOSE 10 GM/15ML PO SOLN
20.0000 g | ORAL | Status: DC
Start: 2024-07-29 — End: 2024-07-30
  Administered 2024-07-29 (×7): 20 g
  Filled 2024-07-29 (×4): qty 30

## 2024-07-29 MED ORDER — OSMOLITE 1.5 CAL PO LIQD: 1000.0000 mL | ORAL | Status: DC

## 2024-07-29 MED ORDER — DEXMEDETOMIDINE HCL IN NACL 400 MCG/100ML IV SOLN
0.0000 ug/kg/h | INTRAVENOUS | Status: DC
  Administered 2024-07-29: 0.4 ug/kg/h via INTRAVENOUS
  Administered 2024-07-29: 1 ug/kg/h via INTRAVENOUS
  Administered 2024-07-29: 0.6 ug/kg/h via INTRAVENOUS
  Filled 2024-07-29 (×3): qty 100

## 2024-07-29 MED ORDER — ADULT MULTIVITAMIN W/MINERALS CH
1.0000 | ORAL_TABLET | Freq: Every day | ORAL | Status: DC
  Administered 2024-07-29 – 2024-08-01 (×3): 1
  Filled 2024-07-29 (×3): qty 1

## 2024-07-29 NOTE — Progress Notes (Signed)
 Patient woke up at shift change while sister was here and began following commands, will pass along to night shift not to over sedate due to probable extubation tomorrow.

## 2024-07-29 NOTE — Progress Notes (Signed)
 Initial Nutrition Assessment  DOCUMENTATION CODES:   Severe malnutrition in context of chronic illness  INTERVENTION:  Initiate tube feeding via OGT if within GOC: Osmolite 1.5 at 10 ml/h and increase by 10 ml every 8 hours until goal of 60 ml/hr (1440 ml per day) Prosource TF20 60 ml BID  Provides 2320 kcal, 130 gm protein, 1097 ml free water daily  MVI with minerals daily 100 mg Thiamine daily x 7 days  Monitor magnesium , potassium, and phosphorus daily for at least 3 days, MD to replete as needed, as pt is at risk for refeeding syndrome   NUTRITION DIAGNOSIS:   Severe Malnutrition related to chronic illness as evidenced by severe muscle depletion, severe fat depletion, NPO status, percent weight loss (35 lb weight loss, 16% in 3 months.).  GOAL:   Patient will meet greater than or equal to 90% of their needs   MONITOR:   TF tolerance, Vent status, Labs, I & O's  REASON FOR ASSESSMENT:   Consult Enteral/tube feeding initiation and management  ASSESSMENT:  68 year old male here with severe sepsis with septic shock in the setting of UTI and bilateral multifocal pneumonia, complicated with bilateral embolic strokes. PMH of CHF- EF <20%, BPH, schizophrenia, intellectual disability, recurrent UTIs.  10/5 intubated   Pt with recent admission 9/10 for UTI, discharged 9/20 to SNF. No family at bedside, unsure how pt has been eating recently. Weight has decreased  from 219 in July 2025 to now 183, 35 lb weight loss, 16% in 3 months. Pt also with severe muscle and fat deficits. Ongoing GOC with family. Palliative recommending one-way extubation and comfort focused care if unable to wean from life support   Consult to start enteral nutrition via OGT as pt has been NPO x 4 days. Discussed with MD plan to hold off as may extubate today.    Patient is currently intubated on ventilator support MV: 13.9 L/min Temp (24hrs), Avg:99.1 F (37.3 C), Min:97.9 F (36.6 C), Max:100.2 F  (37.9 C) MAP (cuff): 97 mmHg  Admit weight: 82 kg   Current weight: 83.5 kg 07/29/24 83.5 kg  07/03/24 82.2 kg  07/01/24 78.6 kg  06/26/24 77.5 kg  06/25/24 76.3 kg  06/14/24 79.1 kg  06/02/24 81.1 kg  05/27/24 85.3 kg  05/15/24 99.6 kg  05/15/24 99.6 kg      Intake/Output Summary (Last 24 hours) at 07/29/2024 1103 Last data filed at 07/29/2024 0852 Gross per 24 hour  Intake 699 ml  Output 1850 ml  Net -1151 ml   Net IO Since Admission: -1,073.58 mL [07/29/24 1103]  Nutritionally Relevant Medications: Scheduled Meds:  feeding supplement (PROSource TF20)  60 mL Per Tube BID   insulin aspart  0-9 Units Subcutaneous Q4H   lactulose   20 g Per Tube Q2H   midodrine   5 mg Per Tube Q8H   multivitamin with minerals  1 tablet Per Tube Daily   thiamine  100 mg Per Tube Daily   Continuous Infusions:  dexmedetomidine (PRECEDEX) IV infusion 0.4 mcg/kg/hr (07/29/24 0852)   feeding supplement (OSMOLITE 1.5 CAL)     Labs Reviewed: Creatinine 1.34 GFR 58 Sodium 143 Phosphorus 2.7 Magnesium  2.3 CBG ranges from 78-102 mg/dL over the last 24 hours HgbA1c 6.2  NUTRITION - FOCUSED PHYSICAL EXAM:  Flowsheet Row Most Recent Value  Orbital Region Severe depletion  Upper Arm Region No depletion  Thoracic and Lumbar Region No depletion  Buccal Region Severe depletion  Temple Region Severe depletion  Clavicle Bone Region Severe  depletion  Clavicle and Acromion Bone Region Severe depletion  Scapular Bone Region Moderate depletion  Dorsal Hand Unable to assess  Patellar Region Moderate depletion  Anterior Thigh Region Moderate depletion  Posterior Calf Region Unable to assess  Edema (RD Assessment) Mild  Hair Reviewed  Eyes Unable to assess  Mouth Unable to assess  Skin Reviewed  Nails Unable to assess    Diet Order:   Diet Order             Diet NPO time specified  Diet effective now                   EDUCATION NEEDS:   Not appropriate for education at this  time  Skin:  Skin Assessment: Skin Integrity Issues: Skin Integrity Issues:: Stage I Stage I: Buttocks  Last BM:  PTA  Height:   Ht Readings from Last 1 Encounters:  07/29/24 6' 5 (1.956 m)    Weight:   Wt Readings from Last 1 Encounters:  07/29/24 83.5 kg    Ideal Body Weight:  94.5 kg  BMI:  Body mass index is 21.83 kg/m.  Estimated Nutritional Needs:   Kcal:  2300-2500 kcal  Protein:  120-140 gm  Fluid:  >2L/day   Jordan Kennedy, RD Registered Dietitian  See Amion for more information

## 2024-07-29 NOTE — Progress Notes (Signed)
 PT Cancellation Note  Patient Details Name: Jordan Kennedy MRN: [id number] DOB: Nov 25, 1955   Cancelled Treatment:    Reason Eval/Treat Not Completed: Patient not medically ready  Pt remains intubated and not responding to commands. Per RN, goal is extubation today. Will followup 10/9.    [name], PT Acute Rehabilitation Services  Office [phone]  [name] 07/29/2024, 8:39 AM

## 2024-07-29 NOTE — Progress Notes (Signed)
 PHARMACY - ANTICOAGULATION  Pharmacy Consult for heparin   Indication: chest pain/ACS and stroke  No Known Allergies  Patient Measurements: Height: 6' 5 (195.6 cm) Weight: 83.5 kg (184 lb 1.4 oz) IBW/kg (Calculated) : 89.1 HEPARIN  DW (KG): 82  Vital Signs: Temp: 99 F (37.2 C) (10/08 1900) BP: 105/75 (10/08 1900) Pulse Rate: 73 (10/08 1900)  Labs: Recent Labs    07/27/24 0620 07/27/24 0950 07/27/24 1225 07/27/24 2013 07/28/24 0858 07/28/24 1046 07/28/24 1553 07/29/24 0011 07/29/24 0330 07/29/24 1006 07/29/24 1941  HGB 9.7*  --   --   --  10.6* 10.8*  --   --  10.4*  --   --   HCT 30.2*  --   --   --  32.6* 33.2*  --   --  32.5*  --   --   PLT 238  --   --   --  219 226  --   --  231  --   --   HEPARINUNFRC  --   --   --    < > 0.12*  --    < > 0.23*  --  0.21* 0.22*  CREATININE 1.34*  --   --   --  1.27*  --   --   --  1.34*  --   --   TROPONINIHS 4,514* 5,392* 4,282*  --   --   --   --   --   --   --   --    < > = values in this interval not displayed.    Estimated Creatinine Clearance: 64 mL/min (A) (by C-G formula based on SCr of 1.34 mg/dL (H)).   Assessment: 68 y/o  male admitted for respiratory distress requiring intubation, NSTEMI and CVA (likely cardioembolic etiology), for heparin .   10/8 PM: HL is subtherapeutic at 0.22 despite rate increase to 1700 units/hour. CBC stable. No bleeding or infusion concerns per RN.   Goal of Therapy:  Heparin  level 0.3-0.5 units/ml Monitor platelets by anticoagulation protocol: Yes   Plan:  Increase Heparin  to 1850 units/hr Check heparin  level in 8 hours. CBC daily   Massie Fila, PharmD Clinical Pharmacist  07/29/2024 8:30 PM

## 2024-07-29 NOTE — Progress Notes (Signed)
 NAME:  Jordan Kennedy, MRN:  [id number], DOB:  [birth year], LOS: 3 ADMISSION DATE:  07/26/2024, CONSULTATION DATE:  07/29/24 REFERRING MD:  EDP, CHIEF COMPLAINT:  respiratory distress    History of Present Illness:  Jordan Kennedy is a 68 y.o. M with PMH schizophrenia and intellectual disability, HFrEF with EF <20% on last echo, hx of BPH and bladder outlet obstruction with indwelling foley and multiple recent admissions and ED visits for foley obstruction and UTI's, most recent discharge 9/20 who presented with respiratory distress and pin point pupils  and was intubated in the ED. Labs significant for elevated lactic acid, troponin and WBC of 20k. CXR with patchy opacities and edema and CT head pending.  PCCM consulted for admission.   Pertinent  Medical History   has a past medical history of AKI (acute kidney injury) (06/29/2018), Anemia, Anemia, iron deficiency (09/18/2015), Bowel obstruction (HCC), BPH (benign prostatic hyperplasia), Chronic constipation, Chronic systolic heart failure (HCC), History of ileus, Ileus (HCC) (03/07/2017), Mental retardation, Partial bowel obstruction (HCC), Schizophrenia (HCC), SIRS (systemic inflammatory response syndrome) (HCC) (06/29/2018), and Small bowel obstruction (HCC) (06/29/2018).   Significant Hospital Events: Including procedures, antibiotic start and stop dates in addition to other pertinent events   10/5 intubated and admit to PCCM  Interim History / Subjective:  Some agitation overnight, improved with sedation pushes Started on Precedex overnight More lethargic today  Objective    Blood pressure 103/84, pulse 75, temperature 100.2 F (37.9 C), resp. rate 20, height 6' 5 (1.956 m), weight 83.5 kg, SpO2 100%.    Vent Mode: PRVC FiO2 (%):  [40 %] 40 % Set Rate:  [20 bmp] 20 bmp Vt Set:  [710 mL] 710 mL PEEP:  [5 cmH20] 5 cmH20 Pressure Support:  [8 cmH20] 8 cmH20 Plateau Pressure:  [5 cmH20-19 cmH20] 18 cmH20   Intake/Output Summary  (Last 24 hours) at 07/29/2024 0736 Last data filed at 07/29/2024 0700 Gross per 24 hour  Intake 718.52 ml  Output 2150 ml  Net -1431.48 ml   Filed Weights   07/26/24 1836 07/28/24 0500 07/29/24 0500  Weight: 82 kg 84.8 kg 83.5 kg    Examination: General: adult male on vent in NAD HENT: [place]/AT, PERRL, no JVD Lungs: Clear bilateral breath sounds Cardiovascular: RRR, no MRG Abdomen: Soft, NT, ND, hypoactive Extremities: Trace edema Neuro: Sedated RASS -3.  BC 10/5 > staph haemolyticus in aerobic bottle only Urine culture > 60k klebsiella.  Echo LVEF < 20%m Grade 2 DD. Mod-Sev MR, Mod TR, Dilated IVC.  Creat 1.3, K 4, WBC 19, Hgb 10.4  Low grade fevers, HD stable,   Assessment and Plan    Acute resp failure Pulmonary edema vs multifocal PNA - RVP and urine strep neg Encephalopathy, presumed metabolic -Full vent support -Wean as tolerated -Will need further direction from legal guardian prior to extubation. PMT assisting. -Bronchial hygiene and RT/bronchodilator protocol  Subacute CVA - MRI brain with multiple non-hemorrhagic embolic infarcts in bilateral cerebral cortex, left thalamus, right cerebellar hemisphere. - Neuro following, appreciate the assistance. - continue ASA, Atorvastatin  Septic shock, Urinary source + possible HCAP. 1 bottle blood growing staph. Urine now showing ESBL Klebsiella 10/8. - Transition antibiotics to meropenem - Off pressors.  - wean midodrine  to 5mg  (home dose is 10)  NSTEMI HFrEF, end stage - echo 10/6 with EF <20% and GIIDD. Elevated troponins  Chronic hypotension  - Continue heparin  infusion - Continue PTA midodrine  - Not a cath candidate  AKI Hypocalcemia Hypomagnesemia BPH  -  Supportive care -awaiting daily labs -follow renal indices, UOP  Elevated LFTs - shock state - Trend LFTs intermittently  Schizophrenia  -resume meds as appropriate  - Home clozapine  on hold but does have withdrawal potention so we should consider  restarting when we can. Will need follow restart protocol with lower dose. QTC is 560. Clozapine  will prolong QTC but has very mild effect per pharmacy.   GOC -previous admission reviewed, notably palliative care note 07/11/24 which indicates that legal guardian is to email DNR paperwork required for DSS.   -PMT assistance appreciated, they have contacted DSS and legal guardian. DNR paperwork provided 10/6 and they have confirmed receipt. Director to review and sign off today 10/7. Will consider 1 way extubation once optimized thereafter.  Critical care time: 30 min required due to shock, encephalopathy, requiring mechanical ventilator.      Deward Eastern, AGACNP-BC [facility]  See Amion for personal pager PCCM on call pager [phone] until 7pm. Please call Elink 7p-7a. [phone]  07/29/2024 11:41 AM

## 2024-07-29 NOTE — Progress Notes (Signed)
 PHARMACY - ANTICOAGULATION  Pharmacy Consult for heparin   Indication: chest pain/ACS and stroke  No Known Allergies  Patient Measurements: Height: 6' 5 (195.6 cm) Weight: 83.5 kg (184 lb 1.4 oz) IBW/kg (Calculated) : 89.1 HEPARIN  DW (KG): 82  Vital Signs: Temp: 100 F (37.8 C) (10/08 1100) BP: 113/87 (10/08 1100) Pulse Rate: 73 (10/08 1100)  Labs: Recent Labs    07/26/24 1730 07/26/24 1810 07/27/24 0620 07/27/24 0950 07/27/24 1225 07/27/24 2013 07/28/24 0858 07/28/24 1046 07/28/24 1553 07/29/24 0011 07/29/24 0330 07/29/24 1006  HGB 11.2*   < > 9.7*  --   --   --  10.6* 10.8*  --   --  10.4*  --   HCT 36.4*   < > 30.2*  --   --   --  32.6* 33.2*  --   --  32.5*  --   PLT 307   < > 238  --   --   --  219 226  --   --  231  --   LABPROT 15.7*  --   --   --   --   --   --   --   --   --   --   --   INR 1.2  --   --   --   --   --   --   --   --   --   --   --   HEPARINUNFRC  --   --   --   --   --    < > 0.12*  --  0.18* 0.23*  --  0.21*  CREATININE 1.91*   < > 1.34*  --   --   --  1.27*  --   --   --  1.34*  --   TROPONINIHS 1,348*   < > 4,514* 4,607* 4,282*  --   --   --   --   --   --   --    < > = values in this interval not displayed.    Estimated Creatinine Clearance: 64 mL/min (A) (by C-G formula based on SCr of 1.34 mg/dL (H)).   Assessment: 68 y/o  male admitted for respiratory distress requiring intubation, NSTEMI and CVA (likely cardioembolic etiology), for heparin .   HL is subtherapeutic at 0.21. CBC stable. No bleeding or infusion concerns per RN.   Goal of Therapy:  Heparin  level 0.3-0.5 units/ml Monitor platelets by anticoagulation protocol: Yes   Plan:  Increase Heparin  to 1700 units/hr Check heparin  level in 8 hours.  Vermell Mccallum, PharmD 07/29/2024  11:39 AM

## 2024-07-29 NOTE — Progress Notes (Signed)
 PHARMACY - ANTICOAGULATION  Pharmacy Consult for heparin   Indication: chest pain/ACS and stroke Brief A/P: Heparin  level subtherapeutic Increase Heparin  rate  No Known Allergies  Patient Measurements: Height: 6' 5 (195.6 cm) Weight: 84.8 kg (186 lb 15.2 oz) IBW/kg (Calculated) : 89.1 HEPARIN  DW (KG): 82  Vital Signs: Temp: 99.3 F (37.4 C) (10/08 0000) Temp Source: Bladder (10/07 2000) BP: 95/71 (10/08 0000) Pulse Rate: 99 (10/08 0000)  Labs: Recent Labs    07/26/24 1730 07/26/24 1810 07/26/24 1943 07/27/24 0116 07/27/24 0620 07/27/24 0950 07/27/24 1225 07/27/24 2013 07/28/24 0858 07/28/24 1046 07/28/24 1553 07/29/24 0011  HGB 11.2*   < > 10.5*  --  9.7*  --   --   --  10.6* 10.8*  --   --   HCT 36.4*   < > 31.0*  --  30.2*  --   --   --  32.6* 33.2*  --   --   PLT 307   < >  --   --  238  --   --   --  219 226  --   --   LABPROT 15.7*  --   --   --   --   --   --   --   --   --   --   --   INR 1.2  --   --   --   --   --   --   --   --   --   --   --   HEPARINUNFRC  --   --   --   --   --   --   --    < > 0.12*  --  0.18* 0.23*  CREATININE 1.91*   < > 1.90*  --  1.34*  --   --   --  1.27*  --   --   --   TROPONINIHS 1,348*   < >  --    < > 4,514* 4,607* 4,282*  --   --   --   --   --    < > = values in this interval not displayed.    Estimated Creatinine Clearance: 68.6 mL/min (A) (by C-G formula based on SCr of 1.27 mg/dL (H)).   Assessment: 68 y/o  male admitted for respiratory distress requiring intubation, NSTEMI and CVA, for heparin      Goal of Therapy:  Heparin  level 0.3-0.5 units/ml Monitor platelets by anticoagulation protocol: Yes   Plan:  Increase Heparin   1600 units/hr Check heparin  level in 8 hours.  Cathlyn Arrant, PharmD, BCPS  07/29/2024  12:55 AM

## 2024-07-29 NOTE — Progress Notes (Signed)
 OT Cancellation Note  Patient Details Name: Jordan Kennedy MRN: [id number] DOB: [birth year]   Cancelled Treatment:    Reason Eval/Treat Not Completed: Patient not medically ready.   Pt remains intubated and not responding to commands. Per RN, goal is extubation today. Will follow up 10/9 for potential OT evaluation.   Leita PARAS Cecily Lawhorne 07/29/2024, 8:47 AM  Leita DEL OTR/L Acute Rehabilitation Services Office: [phone]

## 2024-07-29 NOTE — Progress Notes (Addendum)
 Palliative:  HPI: 68 y.o. male  with past medical history of  schizophrenia and intellectual disability, HFrEF with EF <20% on last echo, hx of BPH and bladder outlet obstruction with indwelling foley and multiple recent admissions and ED visits for foley obstruction and UTI's, most recent discharge 9/20  admitted on 07/26/2024 with respiratory distress and pin point pupils and was intubated in the ED. Labs significant for elevated lactic acid, troponin and WBC of 20k. CXR with patchy opacities and edema.  CTH revealed hypodensity in the anterior L MCA c/f subacute infarct. PMT has been consulted to assist with goals of care conversation as Legal Guardian and patient face treatment options, complex medical decisions, and anticipatory care needs.  I met today at Jordan Kennedy's bedside. He does open eyes but does not track or follow commands.   I was able to speak with legal guardian, Jordan Kennedy, who confirms that DNR and plan of care has been approved. I placed copy of approved DNR paperwork from DSS on shadow chart. I discussed with Jordan Kennedy plans for one way extubation when optimized and she agrees with plan. We also discussed plan for comfort care if he declines after extubation. Jordan Kennedy agrees to discuss plans further after we see how he does off ventilator support.   Update: RN reports concern for Jordan Kennedy sister who was present at bedside and expressed to RN surprise that legal guardian was making medical decisions. I called and discussed with Jordan Kennedy again who confirms that she spoke with Jordan Kennedy sisters about decisions made and also encouraged them to visit. Confirmed with Jordan Kennedy plan for one way extubation and goal for comfort if decline. Jordan Kennedy confirms that all decision making as well as medical information go through legal guardian. Legal guardian will coordinate all communication with family. I updated and discussed with RN and attending team. Please refer family to legal  guardian for any questions or concerns.   Exam: Eyes open at times but no tracking or following commands. Vent via ETT. No distress. Breathing regular, unlabored on vent. Abd soft. Warm to touch.   Plan: - DNR per DSS legal guardian - One way extubation with plans for comfort care if decline  50 min  Bernarda Kitty, NP Palliative Medicine Team Pager [phone] (Please see [email or web address] for schedule) Team Phone [phone]

## 2024-07-29 NOTE — Progress Notes (Signed)
 STROKE TEAM PROGRESS NOTE    SIGNIFICANT HOSPITAL EVENTS: 10/5 - Admitted for altered mental status, pinpoint pupils, and respiratory difficulty, requiring intubation in the ED. Patient also noted to have low-grade fever, low BP with findings concerning for evolving sepsis in the setting of UTI. He was started on IV fluids and empiric antibiotics. Labs significant for elevated lactic acid, troponin and WBC of 20k. CXR with patchy opacities and edema.  CT Head showed early subacute infarct within the anterior left MCA territory.   MRI brain showed multiple acute non-hemorrhagic embolic infarcts involving the bilateral cerebral cortex, left thalamus, and right cerebellar hemisphere.  INTERIM HISTORY/SUBJECTIVE: Patient currently intubated and sedated and neurological exam is unchanged.  He barely opens eyes to noxious stimuli but does not follow any commands.  Moves left side greater than right side semipurposefully.. No family at bedside.  Vital signs stable.   OBJECTIVE  CBC    Component Value Date/Time   WBC 19.1 (H) 07/29/2024 0330   RBC 3.45 (L) 07/29/2024 0330   HGB 10.4 (L) 07/29/2024 0330   HCT 32.5 (L) 07/29/2024 0330   PLT 231 07/29/2024 0330   MCV 94.2 07/29/2024 0330   MCV 95.6 11/04/2014 1744   MCH 30.1 07/29/2024 0330   MCHC 32.0 07/29/2024 0330   RDW 21.4 (H) 07/29/2024 0330   LYMPHSABS 1.4 07/28/2024 1046   MONOABS 1.8 (H) 07/28/2024 1046   EOSABS 0.4 07/28/2024 1046   BASOSABS 0.2 (H) 07/28/2024 1046    BMET    Component Value Date/Time   NA 143 07/29/2024 0330   NA 143 05/09/2023 1146   K 4.0 07/29/2024 0330   CL 113 (H) 07/29/2024 0330   CO2 19 (L) 07/29/2024 0330   GLUCOSE 97 07/29/2024 0330   BUN 18 07/29/2024 0330   BUN 17 05/09/2023 1146   CREATININE 1.34 (H) 07/29/2024 0330   CREATININE 0.92 09/14/2015 1637   CALCIUM  8.2 (L) 07/29/2024 0330   EGFR 62 05/09/2023 1146   GFRNONAA 58 (L) 07/29/2024 0330   GFRNONAA >89 09/14/2015 1637    IMAGING  past 24 hours No results found.   Vitals:   07/29/24 1300 07/29/24 1400 07/29/24 1500 07/29/24 1600  BP: 101/71 92/68 92/66  95/66  Pulse: 77 67 67 73  Resp: 19 17 16 17   Temp: 99.7 F (37.6 C) 99.3 F (37.4 C) 99.1 F (37.3 C) 99 F (37.2 C)  TempSrc:      SpO2:    100%  Weight:      Height:         PHYSICAL EXAM: General: Orally intubated and sedated CV: Regular rate and rhythm on monitor Respiratory: Regular, unlabored respirations on ventilator   NEURO: Patient is sedated and intubated on vent. Does not follow commands. Pupils 2 mm, round, equal and non-reactive. Corneal reflex intact bilaterally. Oculocephalic reflex not present. Gag/cough reflex intact. Non-purposeful movement of all 4 extremities in response to noxious stimuli.  Most Recent NIH: 30   ASSESSMENT/PLAN  Mr. Jordan Kennedy is a 68 y.o. male with history of schizophrenia, tobacco use disorder, HFrEF (<20%), chronic indwelling foley, intellectual disability admitted for respiratory distress, pinpoint pupils, altered mental status and intubated in the ED. NIH on Admission 26. CT head revealed hypodensity in the anterior L MCA c/f subacute infarct. Echo with several risk factors for atrial fibrillation including several bi-atrial dilation and EF <20%, would consider likely underlying cardioembolic etiology of multiple infarcts.   Acute Ischemic Infarct:  Multiple acute ischemic infarcts Etiology:  cryptogenic Code Stroke CT head: probable early subacute infarct within the anterior left MCA territory. ASPECTS 9.    CTA head & neck: No significant stenosis of the internal carotid arteries. The A1 segments are mildly irregular in contour. The M1 segments of the middle cerebral arteries are irregular in contour and demonstrate diffuse mild-to-moderate stenosis. There are moderate irregular stenoses of the proximal M2 branches of the middle cerebral arteries bilaterally, slightly worse on the right. The M2 branches  appeared diffusely diminutive and irregular. MRI: Multiple acute non-hemorrhagic embolic infarcts involving the bilateral cerebral cortex, left thalamus, and right cerebellar hemisphere. 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  2D Echo: LVEF <20%, G2DD, severe dilation of LA and RA, normal MV LDL 58 HgbA1c 6.2 VTE prophylaxis - Heparin  infusion Patient prescribed aspirin ,  but reported not taking prior to admission, now on aspirin  81 mg daily and heparin  IV  Therapy recommendations:  Pending Disposition:  Pending  Hx of Stroke/TIA No prior stroke history  Blood Pressure Home meds: Hold Entresto  24-26 mg, metoprolol  succinate Soft BP's currently; on Midodrine  Blood Pressure Goal: BP less than 220/110   Hyperlipidemia Home meds:  None LDL 58, goal < 70 Continue atorvastatin 80 mg daily  Continue statin at discharge  Diabetes type II Controlled Home meds:  Farxiga  HgbA1c 6.2, goal < 7.0 CBGs SSI Recommend close follow-up with PCP for better DM control  Dysphagia Patient has post-stroke dysphagia, SLP consulted    Diet   Diet NPO time specified    Other Stroke Risk Factors Congestive heart failure  Other Active Problems (Managed per PCCM) Acute resp failure / Pulmonary edema vs multifocal PNA - RVP and urine strep neg /Encephalopathy, presumed metabolic Septic shock, Urinary source + possible HCAP  AKI / Hypocalcemia / Hypomagnesemia / BPH Elevated LFTs Schizophrenia   Hospital day # 3 Patient presented with sepsis and UTI and altered mental status and MRI scan shows multiple embolic strokes likely related to his cardiomyopathy with low EF. Exam is limited due to sedation and intubation.  Recommend continue IV heparin  for stroke prevention given his cardiomyopathy and low ejection fraction.  Transition to oral anticoagulation with NOAC when patient able to swallow or has a PEG tube.  Wean off ventilatory support as tolerated.  There is no family  available at the bedside for discussion.  Discussed with Dr. Harold critical care medicine.  Stroke team will sign off.  Kindly call for questions.   This patient is critically ill and at significant risk of neurological worsening, death and care requires constant monitoring of vital signs, hemodynamics,respiratory and cardiac monitoring, extensive review of multiple databases, frequent neurological assessment, discussion with family, other specialists and medical decision making of high complexity.I have made any additions or clarifications directly to the above note.This critical care time does not reflect procedure time, or teaching time or supervisory time of PA/NP/Med Resident etc but could involve care discussion time.  I spent 31 minutes of neurocritical care time  in the care of  this patient.          Eather Popp, MD Medical Director [facility] Pager: [phone] 07/29/2024 4:47 PM   To contact Stroke Continuity provider, please refer to [email or web address]. After hours, contact General Neurology

## 2024-07-29 NOTE — Progress Notes (Signed)
 Pt had 9 beats of Vtach, then back to NSR, ccm made aware, had same episode during day shift

## 2024-07-30 ENCOUNTER — Inpatient Hospital Stay (HOSPITAL_COMMUNITY)

## 2024-07-30 DIAGNOSIS — I639 Cerebral infarction, unspecified: Secondary | ICD-10-CM | POA: Diagnosis not present

## 2024-07-30 DIAGNOSIS — Z515 Encounter for palliative care: Secondary | ICD-10-CM | POA: Diagnosis not present

## 2024-07-30 DIAGNOSIS — Z7189 Other specified counseling: Secondary | ICD-10-CM | POA: Diagnosis not present

## 2024-07-30 LAB — BASIC METABOLIC PANEL WITH GFR
Anion gap: 12 (ref 5–15)
BUN: 21 mg/dL (ref 8–23)
CO2: 18 mmol/L — ABNORMAL LOW (ref 22–32)
Calcium: 8.1 mg/dL — ABNORMAL LOW (ref 8.9–10.3)
Chloride: 113 mmol/L — ABNORMAL HIGH (ref 98–111)
Creatinine, Ser: 1.11 mg/dL (ref 0.61–1.24)
GFR, Estimated: >60 mL/min (ref >60)
Glucose, Bld: 140 mg/dL — ABNORMAL HIGH (ref 70–99)
Potassium: 4 mmol/L (ref 3.5–5.1)
Sodium: 143 mmol/L (ref 135–145)

## 2024-07-30 LAB — GLUCOSE, CAPILLARY
Glucose-Capillary: 100 mg/dL — ABNORMAL HIGH (ref 70–99)
Glucose-Capillary: 107 mg/dL — ABNORMAL HIGH (ref 70–99)
Glucose-Capillary: 109 mg/dL — ABNORMAL HIGH (ref 70–99)
Glucose-Capillary: 114 mg/dL — ABNORMAL HIGH (ref 70–99)
Glucose-Capillary: 132 mg/dL — ABNORMAL HIGH (ref 70–99)
Glucose-Capillary: 143 mg/dL — ABNORMAL HIGH (ref 70–99)
Glucose-Capillary: 81 mg/dL (ref 70–99)

## 2024-07-30 LAB — CBC
HCT: 34.9 % — ABNORMAL LOW (ref 39.0–52.0)
Hemoglobin: 11.1 g/dL — ABNORMAL LOW (ref 13.0–17.0)
MCH: 30.2 pg (ref 26.0–34.0)
MCHC: 31.8 g/dL (ref 30.0–36.0)
MCV: 95.1 fL (ref 80.0–100.0)
Platelets: 266 K/uL (ref 150–400)
RBC: 3.67 MIL/uL — ABNORMAL LOW (ref 4.22–5.81)
RDW: 21.2 % — ABNORMAL HIGH (ref 11.5–15.5)
WBC: 19.8 K/uL — ABNORMAL HIGH (ref 4.0–10.5)
nRBC: 0.1 % (ref 0.0–0.2)

## 2024-07-30 LAB — MAGNESIUM: Magnesium: 2.1 mg/dL (ref 1.7–2.4)

## 2024-07-30 LAB — HEPARIN LEVEL (UNFRACTIONATED): Heparin Unfractionated: 0.33 [IU]/mL (ref 0.30–0.70)

## 2024-07-30 LAB — PHOSPHORUS: Phosphorus: 2.3 mg/dL — ABNORMAL LOW (ref 2.5–4.6)

## 2024-07-30 MED ORDER — FENTANYL CITRATE PF 50 MCG/ML IJ SOSY
12.5000 ug | PREFILLED_SYRINGE | INTRAMUSCULAR | Status: DC | PRN
Start: 2024-07-30 — End: 2024-07-30

## 2024-07-30 MED ORDER — K PHOS MONO-SOD PHOS DI & MONO 155-852-130 MG PO TABS
500.0000 mg | ORAL_TABLET | ORAL | Status: DC
  Administered 2024-07-30: 500 mg
  Filled 2024-07-30 (×3): qty 2

## 2024-07-30 NOTE — Procedures (Signed)
 Extubation Procedure Note  Patient Details:   Name: Jordan Kennedy DOB: [birth year] MRN: [id number]   Airway Documentation:    Vent end date: 07/30/24 Vent end time: 1520   Evaluation  O2 sats: stable throughout Complications: No apparent complications Patient did tolerate procedure well. Bilateral Breath Sounds: Diminished   Patient extubated per MD order & placed on 4L [place].  Tish Eva Lenis 07/30/2024, 3:28 PM

## 2024-07-30 NOTE — Progress Notes (Signed)
 Palliative:  HPI: 68 y.o. male  with past medical history of  schizophrenia and intellectual disability, HFrEF with EF <20% on last echo, hx of BPH and bladder outlet obstruction with indwelling foley and multiple recent admissions and ED visits for foley obstruction and UTI's, most recent discharge 9/20  admitted on 07/26/2024 with respiratory distress and pin point pupils and was intubated in the ED. Labs significant for elevated lactic acid, troponin and WBC of 20k. CXR with patchy opacities and edema.  CTH revealed hypodensity in the anterior L MCA c/f subacute infarct. PMT has been consulted to assist with goals of care conversation as Legal Guardian and patient face treatment options, complex medical decisions, and anticipatory care needs.   I met today at Jordan Kennedy's bedside. He is more asleep this morning. Discussed with RN and PCCM. Awaiting for him to wake up more as he was more awake and following commands yesterday evening.   Update: Updated by PCCM. I returned to bedside and Jordan Kennedy is extubated and is maintaining his breathing on his own. No distress. He does awaken to voice calling his name. He attempts to speak to me but I am unable to understand him.   I called and updated legal guardian, Jordan Kennedy. I shared that Jordan Kennedy is now extubated and is so far doing well. I did discuss the next obstacle will be assessment of his swallow function and ability to eat/drink. We should know a little more about this tomorrow. We agree to speak again tomorrow for updates and to figure out best path forward.   All questions/concerns addressed. Emotional support provided.   Exam: Extubated. Awakens to voice. Tracks. Following some commands. No distress. Vital signs stable. Breathing regular, unlabored. Abd soft. Warm to touch.   Plan: - DNR/DNI - Comfort care if he declines after extubation - Time for outcomes and ongoing discussions  40 min  Bernarda Kitty, NP Palliative Medicine  Team Pager [phone] (Please see [email or web address] for schedule) Team Phone [phone]

## 2024-07-30 NOTE — TOC Progression Note (Signed)
 Transition of Care [facility]) - Progression Note    Patient Details  Name: Jordan Kennedy MRN: [id number] Date of Birth: 1956-06-23  Transition of [facility] CM/SW Contact  Jordan GORMAN Kindle, LCSW Phone Number: 07/30/2024, 2:40 PM  Clinical Narrative:    CSW spoke with patient's DSS Legal Guardian, Jordan Kennedy [phone]) and let her know that patient's sister, Jordan Kennedy (pt's twin), [phone], was asking how to pursue Guardianship over the patient. Jordan Kennedy confirmed that family has been recently trying to have involvement with patient and that CSW can advise her to petition the court. Family is allowed to visit patient but any decisions must go through [facility] and DSS.  CSW spoke with [facility] and made her aware of the above information. She reported understanding.      Barriers to Discharge: Continued Medical Work up               Expected Discharge Plan and Services                                               Social Drivers of Health (SDOH) Interventions SDOH Screenings   Food Insecurity: Patient Unable To Answer (07/02/2024)  Housing: Patient Unable To Answer (07/02/2024)  Transportation Needs: Patient Unable To Answer (07/02/2024)  Utilities: Patient Unable To Answer (07/02/2024)  Alcohol Screen: Low Risk  (11/05/2023)  Depression (PHQ2-9): Low Risk  (11/05/2023)  Financial Resource Strain: Low Risk  (11/05/2023)  Physical Activity: Inactive (11/05/2023)  Social Connections: Patient Unable To Answer (07/02/2024)  Stress: No Stress Concern Present (11/05/2023)  Tobacco Use: Medium Risk (07/01/2024)  Health Literacy: Adequate Health Literacy (11/05/2023)    Readmission Risk Interventions    07/30/2024    2:39 PM 06/28/2024   11:41 AM 06/11/2024   10:27 AM  Readmission Risk Prevention Plan  Transportation Screening Complete Complete Complete  PCP or Specialist Appt within 5-7 Days  Complete   PCP or Specialist Appt within 3-5 Days   Complete  Home Care Screening   Complete   Medication Review (RN CM)  Complete   HRI or Home Care Consult   Complete  Social Work Consult for Recovery Care Planning/Counseling   Complete  Palliative Care Screening   Not Applicable  Medication Review [employer]) Complete  Complete  PCP or Specialist appointment within 3-5 days of discharge Complete    HRI or Home Care Consult Complete    SW Recovery Care/Counseling Consult Complete    Palliative Care Screening Complete    Skilled Nursing Facility Complete

## 2024-07-30 NOTE — Progress Notes (Signed)
 OT Cancellation Note  Patient Details Name: Jordan Kennedy MRN: [id number] DOB: July 11, 1956   Cancelled Treatment:    Reason Eval/Treat Not Completed: Patient not medically ready (Per medical cancelation policy, OT to sign off and await new orders when he is medically appropriate to particiapte.)  Lucie JONETTA Kendall 07/30/2024, 10:02 AM

## 2024-07-30 NOTE — Progress Notes (Signed)
 PHARMACY - ANTICOAGULATION  Pharmacy Consult for heparin   Indication: chest pain/ACS and stroke  No Known Allergies  Patient Measurements: Height: 6' 5 (195.6 cm) Weight: 83.5 kg (184 lb 1.4 oz) IBW/kg (Calculated) : 89.1 HEPARIN  DW (KG): 82  Vital Signs: Temp: 98.8 F (37.1 C) (10/09 0908) BP: 87/61 (10/09 0908) Pulse Rate: 66 (10/09 0908)  Labs: Recent Labs    07/27/24 1225 07/27/24 2013 07/28/24 0858 07/28/24 1046 07/28/24 1553 07/29/24 0330 07/29/24 1006 07/29/24 1941 07/30/24 0445  HGB  --    < > 10.6* 10.8*  --  10.4*  --   --  11.1*  HCT  --    < > 32.6* 33.2*  --  32.5*  --   --  34.9*  PLT  --    < > 219 226  --  231  --   --  266  HEPARINUNFRC  --    < > 0.12*  --    < >  --  0.21* 0.22* 0.33  CREATININE  --   --  1.27*  --   --  1.34*  --   --  1.11  TROPONINIHS 4,282*  --   --   --   --   --   --   --   --    < > = values in this interval not displayed.    Estimated Creatinine Clearance: 77.3 mL/min (by C-G formula based on SCr of 1.11 mg/dL).   Assessment: 68 y/o  male admitted for respiratory distress requiring intubation, NSTEMI and CVA (likely cardioembolic etiology), for heparin .   HL therapeutic at 0.33. CBC stable. No bleeding or infusion concerns per RN.   Goal of Therapy:  Heparin  level 0.3-0.5 units/ml Monitor platelets by anticoagulation protocol: Yes   Plan:  Continue Heparin  1850 units/hr Daily heparin  level  CBC daily   Vermell Mccallum, PharmD 07/30/2024 11:21 AM

## 2024-07-30 NOTE — Progress Notes (Signed)
 PT Cancellation Note  Patient Details Name: Ilias Stcharles MRN: [id number] DOB: Dec 29, 1955   Cancelled Treatment:    Reason Eval/Treat Not Completed: Other (comment)  Patient medically not appropriate for evaluation at this time. Discussed with RN and will sign-off. Please re-order if becomes appropriate.    [name], PT Acute Rehabilitation Services  Office [phone]  [name] 07/30/2024, 10:54 AM

## 2024-07-30 NOTE — Progress Notes (Signed)
 NAME:  Jordan Kennedy, MRN:  [id number], DOB:  1956-06-14, LOS: 4 ADMISSION DATE:  07/26/2024, CONSULTATION DATE:  07/30/24 REFERRING MD:  EDP, CHIEF COMPLAINT:  respiratory distress    History of Present Illness:  [name] is a 68 y.o. M with PMH schizophrenia and intellectual disability, HFrEF with EF <20% on last echo, hx of BPH and bladder outlet obstruction with indwelling foley and multiple recent admissions and ED visits for foley obstruction and UTI's, most recent discharge 9/20 who presented with respiratory distress and pin point pupils  and was intubated in the ED. Labs significant for elevated lactic acid, troponin and WBC of 20k. CXR with patchy opacities and edema and CT head pending.  PCCM consulted for admission.   Pertinent  Medical History   has a past medical history of AKI (acute kidney injury) (06/29/2018), Anemia, Anemia, iron deficiency (09/18/2015), Bowel obstruction (HCC), BPH (benign prostatic hyperplasia), Chronic constipation, Chronic systolic heart failure (HCC), History of ileus, Ileus (HCC) (03/07/2017), Mental retardation, Partial bowel obstruction (HCC), Schizophrenia (HCC), SIRS (systemic inflammatory response syndrome) (HCC) (06/29/2018), and Small bowel obstruction (HCC) (06/29/2018).   Significant Hospital Events: Including procedures, antibiotic start and stop dates in addition to other pertinent events   10/5 intubated and admit to [facility] 10/9 somnolent but awake and following commands.   Interim History / Subjective:  Awake this morning.  Weak Did require sedatives overnight for agitation.  Started SBT  Objective    Blood pressure 94/72, pulse 63, temperature 98.4 F (36.9 C), resp. rate 16, height 6' 5 (1.956 m), weight 83.5 kg, SpO2 100%.    Vent Mode: PRVC FiO2 (%):  [40 %] 40 % Set Rate:  [16 bmp-20 bmp] 16 bmp Vt Set:  [710 mL] 710 mL PEEP:  [5 cmH20] 5 cmH20 Pressure Support:  [5 cmH20] 5 cmH20 Plateau Pressure:  [19 cmH20-21  cmH20] 19 cmH20   Intake/Output Summary (Last 24 hours) at 07/30/2024 0749 Last data filed at 07/30/2024 0600 Gross per 24 hour  Intake 1288.43 ml  Output 1700 ml  Net -411.57 ml   Filed Weights   07/26/24 1836 07/28/24 0500 07/29/24 0500  Weight: 82 kg 84.8 kg 83.5 kg    Examination:  General: Adult male on vent in NAD HENT: [place]/AT, PERRL, no JVD Lungs: Clear Cardiovascular: RRR, no MRG Abdomen: Soft, NT, ND Extremities: NO acute deformity. Trace edema Neuro: RASS -1. Follows commands.  BC 10/5 > staph haemolyticus in aerobic bottle only Urine culture > 60k klebsiella - ESBL (sens to gentamicin and meropenem only) Echo LVEF < 20%m Grade 2 DD. Mod-Sev MR, Mod TR, Dilated IVC.  Creat 1.1, K 4, WBC 19.8, Hgb 11.1  Afebrile, VSS   Assessment and Plan    Acute resp failure Pulmonary edema vs multifocal PNA - RVP and urine strep neg Encephalopathy, presumed metabolic. Now agitated at times, but very sensitive to sedatives.  -Full vent support -Wean as tolerated -CXR today -Will plan on one way extubation once optimized. Appreciate PMT.  -Bronchial hygiene and RT/bronchodilator protocol  Subacute CVA - MRI brain with multiple non-hemorrhagic embolic infarcts in bilateral cerebral cortex, left thalamus, right cerebellar hemisphere. - Neuro has signed off - Continue heparin  considering cardiomyopathy - continue ASA, Atorvastatin  Septic shock UTI - ESBL Klebsiella. Chronic foley.  Possible HCAP BC -1 bottle blood growing staph hemolyticus. Likely a contaminant.  - Transition antibiotics to meropenem - wean midodrine  to 5mg  (home dose is 10) Keep.  NSTEMI HFrEF, end stage - echo 10/6  with EF <20% and GIIDD. Elevated troponins  Chronic hypotension  - Continue heparin  infusion - Continue PTA midodrine  - Not a cath candidate  AKI Hypocalcemia Hypomagnesemia BPH  -Supportive care -awaiting daily labs -follow renal indices, UOP  Elevated LFTs - shock state -  Trend LFTs intermittently  Schizophrenia  -resume meds as appropriate  - Home clozapine  on hold but does have withdrawal potention so we should consider restarting when we can. Will need follow restart protocol with lower dose.  Clozapine  will prolong QTC but has very mild effect per pharmacy.   GOC -Appreciate PMT assistance. Care to be coordinated with legal guardian only. Code status changed to DNR. Plan is for one way extubation when criteria is met. Possibly today depending on how he weans.   Critical care time: 32 min required due to shock, encephalopathy, requiring mechanical ventilator.      Deward Eastern, AGACNP-BC [facility]  See Amion for personal pager PCCM on call pager [phone] until 7pm. Please call Elink 7p-7a. [phone]  07/30/2024 7:49 AM

## 2024-07-30 NOTE — Progress Notes (Signed)
 SLP Cancellation Note  Patient Details Name: Lantz Hermann MRN: [id number] DOB: 30-Jul-1956   Cancelled treatment:       Reason Eval/Treat Not Completed: Patient not medically ready. Pt still on vent as of this am. Will f/u as able.    Leita SAILOR., M.A. CCC-SLP Acute Rehabilitation Services Office: [phone]  Secure chat preferred  07/30/2024, 8:20 AM

## 2024-07-30 NOTE — Progress Notes (Signed)
 Pharmacy Electrolyte Replacement  Recent Labs:  Recent Labs    07/30/24 0445  K 4.0  MG 2.1  PHOS 2.3*  CREATININE 1.11    Low Critical Values (K </= 2.5, Phos </= 1, Mg </= 1) Present: None  Plan: Give phosphorous tabs 500mg  q4h for 3 doses.   Taqwa Deem, PharmD

## 2024-07-31 DIAGNOSIS — I639 Cerebral infarction, unspecified: Secondary | ICD-10-CM | POA: Diagnosis not present

## 2024-07-31 DIAGNOSIS — Z7189 Other specified counseling: Secondary | ICD-10-CM | POA: Diagnosis not present

## 2024-07-31 DIAGNOSIS — Z515 Encounter for palliative care: Secondary | ICD-10-CM | POA: Diagnosis not present

## 2024-07-31 LAB — CULTURE, RESPIRATORY W GRAM STAIN: Gram Stain: NONE SEEN

## 2024-07-31 LAB — CULTURE, BLOOD (ROUTINE X 2)
Culture: NO GROWTH
Special Requests: ADEQUATE

## 2024-07-31 LAB — MAGNESIUM: Magnesium: 1.8 mg/dL (ref 1.7–2.4)

## 2024-07-31 LAB — HEPARIN LEVEL (UNFRACTIONATED)
Heparin Unfractionated: 0.27 [IU]/mL — ABNORMAL LOW (ref 0.30–0.70)
Heparin Unfractionated: 0.51 [IU]/mL (ref 0.30–0.70)

## 2024-07-31 LAB — COMPREHENSIVE METABOLIC PANEL WITH GFR
ALT: 62 U/L — ABNORMAL HIGH (ref 0–44)
AST: 23 U/L (ref 15–41)
Albumin: 2 g/dL — ABNORMAL LOW (ref 3.5–5.0)
Alkaline Phosphatase: 113 U/L (ref 38–126)
Anion gap: 10 (ref 5–15)
BUN: 10 mg/dL (ref 8–23)
CO2: 23 mmol/L (ref 22–32)
Calcium: 8 mg/dL — ABNORMAL LOW (ref 8.9–10.3)
Chloride: 111 mmol/L (ref 98–111)
Creatinine, Ser: 1.05 mg/dL (ref 0.61–1.24)
GFR, Estimated: >60 mL/min (ref >60)
Glucose, Bld: 96 mg/dL (ref 70–99)
Potassium: 4.1 mmol/L (ref 3.5–5.1)
Sodium: 144 mmol/L (ref 135–145)
Total Bilirubin: 0.9 mg/dL (ref 0.0–1.2)
Total Protein: 5.3 g/dL — ABNORMAL LOW (ref 6.5–8.1)

## 2024-07-31 LAB — BLOOD GAS, VENOUS
Acid-base deficit: 0.7 mmol/L (ref 0.0–2.0)
Bicarbonate: 24.9 mmol/L (ref 20.0–28.0)
Drawn by: 59174
O2 Saturation: 96.7 %
Patient temperature: 37.4
pCO2, Ven: 45 mmHg (ref 44–60)
pH, Ven: 7.35 (ref 7.25–7.43)
pO2, Ven: 75 mmHg — ABNORMAL HIGH (ref 32–45)

## 2024-07-31 LAB — CBC
HCT: 37.9 % — ABNORMAL LOW (ref 39.0–52.0)
Hemoglobin: 11.3 g/dL — ABNORMAL LOW (ref 13.0–17.0)
MCH: 30.1 pg (ref 26.0–34.0)
MCHC: 29.8 g/dL — ABNORMAL LOW (ref 30.0–36.0)
MCV: 101.1 fL — ABNORMAL HIGH (ref 80.0–100.0)
Platelets: 240 K/uL (ref 150–400)
RBC: 3.75 MIL/uL — ABNORMAL LOW (ref 4.22–5.81)
RDW: 21.2 % — ABNORMAL HIGH (ref 11.5–15.5)
WBC: 15 K/uL — ABNORMAL HIGH (ref 4.0–10.5)
nRBC: 0 % (ref 0.0–0.2)

## 2024-07-31 LAB — GLUCOSE, CAPILLARY
Glucose-Capillary: 87 mg/dL (ref 70–99)
Glucose-Capillary: 82 mg/dL (ref 70–99)
Glucose-Capillary: 84 mg/dL (ref 70–99)
Glucose-Capillary: 87 mg/dL (ref 70–99)
Glucose-Capillary: 76 mg/dL (ref 70–99)
Glucose-Capillary: 71 mg/dL (ref 70–99)

## 2024-07-31 LAB — PHOSPHORUS: Phosphorus: 2.4 mg/dL — ABNORMAL LOW (ref 2.5–4.6)

## 2024-07-31 LAB — AMMONIA: Ammonia: 33 umol/L (ref 9–35)

## 2024-07-31 MED ORDER — ORAL CARE MOUTH RINSE: 15.0000 mL | OROMUCOSAL | Status: DC | PRN

## 2024-07-31 MED ORDER — K PHOS MONO-SOD PHOS DI & MONO 155-852-130 MG PO TABS: 500.0000 mg | ORAL_TABLET | ORAL | Status: DC

## 2024-07-31 MED ORDER — SODIUM PHOSPHATES 45 MMOLE/15ML IV SOLN
30.0000 mmol | Freq: Once | INTRAVENOUS | Status: AC
  Administered 2024-07-31: 30 mmol via INTRAVENOUS
  Filled 2024-07-31: qty 10

## 2024-07-31 NOTE — Progress Notes (Signed)
 PT Cancellation Note  Patient Details Name: Jordan Kennedy MRN: [id number] DOB: November 09, 1955   Cancelled Treatment:    Reason Eval/Treat Not Completed: Medical issues which prohibited therapy - per CCM notes pt with tenuous respiratory status s/p extubation. PT to check back.  Talya Quain S, PT DPT Acute Rehabilitation Services Secure Chat Preferred  Office [phone]    Jordan Kennedy 07/31/2024, 4:20 PM

## 2024-07-31 NOTE — Progress Notes (Signed)
 Palliative:  HPI: 68 y.o. male  with past medical history of  schizophrenia and intellectual disability, HFrEF with EF <20% on last echo, hx of BPH and bladder outlet obstruction with indwelling foley and multiple recent admissions and ED visits for foley obstruction and UTI's, most recent discharge 9/20  admitted on 07/26/2024 with respiratory distress and pin point pupils and was intubated in the ED. Labs significant for elevated lactic acid, troponin and WBC of 20k. CXR with patchy opacities and edema.  CTH revealed hypodensity in the anterior L MCA c/f subacute infarct. PMT has been consulted to assist with goals of care conversation as Legal Guardian and patient face treatment options, complex medical decisions, and anticipatory care needs.   I met today at Jordan Kennedy's bedside. No family present. He is very sleepy. He opens eyes slightly and very briefly. Not tracking or following commands. Not awake enough to even work with SLP for eval. He does have weak cough with significant congestion/secretions that he does not expectorate. Extremely high risk for aspiration. Maintaining his breathing at this time. High risk for decline  I discussed with PCCM.   I reached out to legal guardian and left voicemail. I reached out again later in the day but unable to reach.   I discussed with PCCM potential of feeding tube. Given high risk of decline and aspiration and that feeding tube will not prevent this decline we will recommend to hold on placement of feeding tube at this time until further conversation with guardian. Legal guardian has agreed with comfort if declining after extubation. Overall prognosis remains poor.   Exam: Lethargic. No distress. Not awakening as he was yesterday. Tachypneic but no distress. Abd soft. Warm to touch.   Plan: - DNR/DNI - Comfort if declines - Needs ongoing conversation with legal guardian - hold on feeding tube placement until able to discuss  40 min  Bernarda Kitty,  NP Palliative Medicine Team Pager [phone] (Please see [email or web address] for schedule) Team Phone [phone]

## 2024-07-31 NOTE — Progress Notes (Signed)
 SLP Cancellation Note  Patient Details Name: Jordan Kennedy MRN: [id number] DOB: 1956/06/06   Cancelled treatment:       Reason Eval/Treat Not Completed: Patient not medically ready (d/c order per discussion with Provider). SLP team available for re-consult as indicated.   Miyako Oelke J Michal Strzelecki 07/31/2024, 9:56 AM

## 2024-07-31 NOTE — Progress Notes (Signed)
 PHARMACY - ANTICOAGULATION  Pharmacy Consult for heparin   Indication: chest pain/ACS and stroke  No Known Allergies  Patient Measurements: Height: 6' 5 (195.6 cm) Weight: 83.5 kg (184 lb 1.4 oz) IBW/kg (Calculated) : 89.1 HEPARIN  DW (KG): 82  Vital Signs: Temp: 99.1 F (37.3 C) (10/10 1750) Temp Source: Bladder (10/10 0800) BP: 110/76 (10/10 1700) Pulse Rate: 97 (10/10 1750)  Labs: Recent Labs    07/29/24 0330 07/29/24 1006 07/30/24 0445 07/31/24 0517 07/31/24 1701  HGB 10.4*  --  11.1* 11.3*  --   HCT 32.5*  --  34.9* 37.9*  --   PLT 231  --  266 240  --   HEPARINUNFRC  --    < > 0.33 0.27* 0.51  CREATININE 1.34*  --  1.11 1.05  --    < > = values in this interval not displayed.    Estimated Creatinine Clearance: 81.7 mL/min (by C-G formula based on SCr of 1.05 mg/dL).   Assessment: 68 y/o  male admitted for respiratory distress requiring intubation, NSTEMI and CVA (likely cardioembolic etiology), for heparin .   HL slightly supratherapeutic at 0.51. CBC stable. No bleeding or infusion concerns per RN.   Goal of Therapy:  Heparin  level 0.3-0.5 units/ml Monitor platelets by anticoagulation protocol: Yes   Plan:  Decrease Heparin  to 1900 units/hr Daily heparin  level  CBC daily   Rocky Slade, PharmD, BCPS 07/31/2024 6:58 PM   Please check AMION for all [facility] Pharmacy phone numbers After 10:00 PM, call Main Pharmacy [phone]

## 2024-07-31 NOTE — Plan of Care (Signed)
  Problem: Coping: Goal: Will verbalize positive feelings about self Outcome: Not Progressing   Problem: Health Behavior/Discharge Planning: Goal: Goals will be collaboratively established with patient/family Outcome: Progressing   Problem: Self-Care: Goal: Ability to communicate needs accurately will improve Outcome: Not Progressing   Problem: Nutrition: Goal: Dietary intake will improve Outcome: Not Progressing   Problem: Clinical Measurements: Goal: Will remain free from infection Outcome: Progressing Goal: Respiratory complications will improve Outcome: Progressing   Problem: Clinical Measurements: Goal: Respiratory complications will improve Outcome: Progressing

## 2024-07-31 NOTE — Progress Notes (Signed)
 PHARMACY - ANTICOAGULATION  Pharmacy Consult for heparin   Indication: chest pain/ACS and stroke  No Known Allergies  Patient Measurements: Height: 6' 5 (195.6 cm) Weight: 83.5 kg (184 lb 1.4 oz) IBW/kg (Calculated) : 89.1 HEPARIN  DW (KG): 82  Vital Signs: Temp: 99.3 F (37.4 C) (10/10 1000) Temp Source: Bladder (10/10 0800) BP: 110/96 (10/10 1000) Pulse Rate: 86 (10/10 1000)  Labs: Recent Labs    07/29/24 0330 07/29/24 1006 07/29/24 1941 07/30/24 0445 07/31/24 0517  HGB 10.4*  --   --  11.1* 11.3*  HCT 32.5*  --   --  34.9* 37.9*  PLT 231  --   --  266 240  HEPARINUNFRC  --    < > 0.22* 0.33 0.27*  CREATININE 1.34*  --   --  1.11 1.05   < > = values in this interval not displayed.    Estimated Creatinine Clearance: 81.7 mL/min (by C-G formula based on SCr of 1.05 mg/dL).   Assessment: 68 y/o  male admitted for respiratory distress requiring intubation, NSTEMI and CVA (likely cardioembolic etiology), for heparin .   HL subtherapeutic at 0.27. CBC stable. No bleeding or infusion concerns per RN.   Goal of Therapy:  Heparin  level 0.3-0.5 units/ml Monitor platelets by anticoagulation protocol: Yes   Plan:  Increase Heparin  1950 units/hr HL in 6 hours  Daily heparin  level  CBC daily   Vermell Mccallum, PharmD 07/31/2024 11:24 AM

## 2024-07-31 NOTE — Progress Notes (Signed)
 NAME:  Thadd Apuzzo, MRN:  [id number], DOB:  04-12-1956, LOS: 5 ADMISSION DATE:  07/26/2024, CONSULTATION DATE:  07/31/24 REFERRING MD:  EDP, CHIEF COMPLAINT:  respiratory distress    History of Present Illness:  Jordan Kennedy is a 68 y.o. M with PMH schizophrenia and intellectual disability, HFrEF with EF <20% on last echo, hx of BPH and bladder outlet obstruction with indwelling foley and multiple recent admissions and ED visits for foley obstruction and UTI's, most recent discharge 9/20 who presented with respiratory distress and pin point pupils  and was intubated in the ED. Labs significant for elevated lactic acid, troponin and WBC of 20k. CXR with patchy opacities and edema and CT head pending.  PCCM consulted for admission.   Pertinent  Medical History   has a past medical history of AKI (acute kidney injury) (06/29/2018), Anemia, Anemia, iron deficiency (09/18/2015), Bowel obstruction (HCC), BPH (benign prostatic hyperplasia), Chronic constipation, Chronic systolic heart failure (HCC), History of ileus, Ileus (HCC) (03/07/2017), Mental retardation, Partial bowel obstruction (HCC), Schizophrenia (HCC), SIRS (systemic inflammatory response syndrome) (HCC) (06/29/2018), and Small bowel obstruction (HCC) (06/29/2018).   Significant Hospital Events: Including procedures, antibiotic start and stop dates in addition to other pertinent events   10/5 intubated and admit to [facility] 10/9 somnolent but awake and following commands. Extubated  Interim History / Subjective:  Extubated yesterday More somnolent today No acute events overnight. Has not received any sedatives for over 24 hours.  Objective    Blood pressure 103/69, pulse 84, temperature 99.3 F (37.4 C), temperature source Bladder, resp. rate 16, height 6' 5 (1.956 m), weight 83.5 kg, SpO2 100%.    Vent Mode: CPAP;PSV FiO2 (%):  [40 %] 40 % PEEP:  [5 cmH20] 5 cmH20 Pressure Support:  [5 cmH20] 5 cmH20   Intake/Output Summary  (Last 24 hours) at 07/31/2024 0905 Last data filed at 07/31/2024 0800 Gross per 24 hour  Intake 877.09 ml  Output 1725 ml  Net -847.91 ml   Filed Weights   07/26/24 1836 07/28/24 0500 07/29/24 0500  Weight: 82 kg 84.8 kg 83.5 kg    Examination:  General: adult male in NAD HENT: [place]/AT, PERRL, no JVD Lungs: clear bilateral breath sounds Cardiovascular: RRR, no MRG Abdomen: Soft, NT, ND Extremities: No acute deformity  Neuro: RASS - 2. Will intermittently follow commands particularly in the left upper extremity.   BC 10/5 > staph haemolyticus in aerobic bottle only Urine culture > 60k klebsiella - ESBL (sens to gentamicin and meropenem only) Echo LVEF < 20%m Grade 2 DD. Mod-Sev MR, Mod TR, Dilated IVC.  Creat 1.05, K 4, WBC 19.8, Hgb 11.1  Afebrile, VSS   Assessment and Plan    Acute resp failure Pulmonary edema vs multifocal PNA - RVP and urine strep neg Encephalopathy, presumed metabolic. Now agitated at times, but very sensitive to sedatives.  -One way extubation 10/9 -More lethargic today. Check VBG -Bronchial hygiene and RT/bronchodilator protocol  Subacute CVA - MRI brain with multiple non-hemorrhagic embolic infarcts in bilateral cerebral cortex, left thalamus, right cerebellar hemisphere. - Neuro has signed off - Continue heparin  considering cardiomyopathy - continue ASA, Atorvastatin  Septic encephalopathy - remains quite lethargic today despite overall improvement. - Check VBG and ammonia - TSH wnl  Septic shock- resolved UTI - ESBL Klebsiella. Chronic foley.  Possible HCAP BC -1 bottle blood growing staph hemolyticus. Likely a contaminant.  - Transition antibiotics to meropenem - wean midodrine  to 5mg  (home dose is 10) Keep.  NSTEMI HFrEF, end  stage - echo 10/6 with EF <20% and GIIDD. Elevated troponins  Chronic hypotension  - Continue heparin  infusion - Continue PTA midodrine  - Not a cath candidate  AKI -  resolved Hypocalcemia Hypomagnesemia BPH  -Supportive care -follow renal indices, UOP  Elevated LFTs - shock state - Trend LFTs intermittently - check ammonia  Schizophrenia  -resume meds as appropriate  - Home clozapine  on hold but does have withdrawal potention so we should consider restarting when we can. Will need follow restart protocol with lower dose.  Clozapine  will prolong QTC but has very mild effect per pharmacy.   GOC -Appreciate PMT assistance. Care to be coordinated with legal guardian only. Code status changed to DNR. One way extubation 10/9. If workup reveals he is hypercarbic and failing extubation will discuss comfort measures with PMT and legal guardian.   Critical care time:  36 minutes required due to encephalopathy with tenuous airway status.     Deward Eastern, AGACNP-BC [facility]  See Amion for personal pager PCCM on call pager [phone] until 7pm. Please call Elink 7p-7a. [phone]  07/31/2024 9:05 AM

## 2024-08-01 DIAGNOSIS — Z515 Encounter for palliative care: Secondary | ICD-10-CM | POA: Diagnosis not present

## 2024-08-01 DIAGNOSIS — I639 Cerebral infarction, unspecified: Secondary | ICD-10-CM | POA: Diagnosis not present

## 2024-08-01 DIAGNOSIS — Z7189 Other specified counseling: Secondary | ICD-10-CM | POA: Diagnosis not present

## 2024-08-01 LAB — GLUCOSE, CAPILLARY
Glucose-Capillary: 79 mg/dL (ref 70–99)
Glucose-Capillary: 78 mg/dL (ref 70–99)
Glucose-Capillary: 103 mg/dL — ABNORMAL HIGH (ref 70–99)
Glucose-Capillary: 100 mg/dL — ABNORMAL HIGH (ref 70–99)
Glucose-Capillary: 117 mg/dL — ABNORMAL HIGH (ref 70–99)

## 2024-08-01 LAB — CBC
HCT: 34.4 % — ABNORMAL LOW (ref 39.0–52.0)
Hemoglobin: 11 g/dL — ABNORMAL LOW (ref 13.0–17.0)
MCH: 30 pg (ref 26.0–34.0)
MCHC: 32 g/dL (ref 30.0–36.0)
MCV: 93.7 fL (ref 80.0–100.0)
Platelets: 333 K/uL (ref 150–400)
RBC: 3.67 MIL/uL — ABNORMAL LOW (ref 4.22–5.81)
RDW: 19.9 % — ABNORMAL HIGH (ref 11.5–15.5)
WBC: 16.4 K/uL — ABNORMAL HIGH (ref 4.0–10.5)
nRBC: 0 % (ref 0.0–0.2)

## 2024-08-01 LAB — BASIC METABOLIC PANEL WITH GFR
Anion gap: 14 (ref 5–15)
BUN: 10 mg/dL (ref 8–23)
CO2: 21 mmol/L — ABNORMAL LOW (ref 22–32)
Calcium: 8.1 mg/dL — ABNORMAL LOW (ref 8.9–10.3)
Chloride: 109 mmol/L (ref 98–111)
Creatinine, Ser: 0.98 mg/dL (ref 0.61–1.24)
GFR, Estimated: >60 mL/min (ref >60)
Glucose, Bld: 86 mg/dL (ref 70–99)
Potassium: 3.7 mmol/L (ref 3.5–5.1)
Sodium: 144 mmol/L (ref 135–145)

## 2024-08-01 LAB — PHOSPHORUS: Phosphorus: 2.1 mg/dL — ABNORMAL LOW (ref 2.5–4.6)

## 2024-08-01 LAB — MAGNESIUM: Magnesium: 1.8 mg/dL (ref 1.7–2.4)

## 2024-08-01 LAB — HEPARIN LEVEL (UNFRACTIONATED): Heparin Unfractionated: 0.35 [IU]/mL (ref 0.30–0.70)

## 2024-08-01 MED ORDER — POTASSIUM PHOSPHATES 15 MMOLE/5ML IV SOLN
30.0000 mmol | Freq: Once | INTRAVENOUS | Status: AC
  Administered 2024-08-01: 30 mmol via INTRAVENOUS
  Filled 2024-08-01: qty 10

## 2024-08-01 MED ORDER — MAGNESIUM SULFATE 2 GM/50ML IV SOLN
2.0000 g | Freq: Once | INTRAVENOUS | Status: AC
  Administered 2024-08-01: 2 g via INTRAVENOUS
  Filled 2024-08-01: qty 50

## 2024-08-01 MED ORDER — PAROXETINE HCL 20 MG PO TABS
40.0000 mg | ORAL_TABLET | Freq: Every day | ORAL | Status: DC
  Administered 2024-08-02 – 2024-08-07 (×6): 40 mg via ORAL
  Filled 2024-08-01 (×6): qty 2

## 2024-08-01 MED ORDER — ASPIRIN 81 MG PO CHEW
81.0000 mg | CHEWABLE_TABLET | Freq: Every day | ORAL | Status: DC
  Administered 2024-08-02 – 2024-08-07 (×6): 81 mg via ORAL
  Filled 2024-08-01 (×6): qty 1

## 2024-08-01 MED ORDER — ATORVASTATIN CALCIUM 80 MG PO TABS
80.0000 mg | ORAL_TABLET | Freq: Every day | ORAL | Status: DC
  Administered 2024-08-02 – 2024-08-07 (×6): 80 mg via ORAL
  Filled 2024-08-01 (×7): qty 1

## 2024-08-01 MED ORDER — MIDODRINE HCL 5 MG PO TABS
5.0000 mg | ORAL_TABLET | Freq: Three times a day (TID) | ORAL | Status: DC
  Administered 2024-08-01 – 2024-08-07 (×19): 5 mg via ORAL
  Filled 2024-08-01 (×19): qty 1

## 2024-08-01 MED ORDER — ADULT MULTIVITAMIN W/MINERALS CH
1.0000 | ORAL_TABLET | Freq: Every day | ORAL | Status: DC
  Administered 2024-08-02 – 2024-08-07 (×6): 1 via ORAL
  Filled 2024-08-01 (×6): qty 1

## 2024-08-01 NOTE — Progress Notes (Signed)
 NAME:  Jordan Kennedy, MRN:  [id number], DOB:  03-14-1956, LOS: 6 ADMISSION DATE:  07/26/2024, CONSULTATION DATE:  08/01/24 REFERRING MD:  EDP, CHIEF COMPLAINT:  respiratory distress    History of Present Illness:   Jordan Kennedy is a 68 y.o. M with PMH schizophrenia and intellectual disability, HFrEF with EF <20% on last echo, hx of BPH and bladder outlet obstruction with indwelling foley and multiple recent admissions and ED visits for foley obstruction and UTI's, most recent discharge 9/20 who presented with respiratory distress and pin point pupils  and was intubated in the ED. Labs significant for elevated lactic acid, troponin and WBC of 20k. CXR with patchy opacities and edema and CT head pending.  PCCM consulted for admission.  Pertinent  Medical History   has a past medical history of AKI (acute kidney injury) (06/29/2018), Anemia, Anemia, iron deficiency (09/18/2015), Bowel obstruction (HCC), BPH (benign prostatic hyperplasia), Chronic constipation, Chronic systolic heart failure (HCC), History of ileus, Ileus (HCC) (03/07/2017), Mental retardation, Partial bowel obstruction (HCC), Schizophrenia (HCC), SIRS (systemic inflammatory response syndrome) (HCC) (06/29/2018), and Small bowel obstruction (HCC) (06/29/2018).   Significant Hospital Events: Including procedures, antibiotic start and stop dates in addition to other pertinent events   10/5 intubated and admit to [facility] 10/9 somnolent but awake and following commands. Extubated  Interim History / Subjective:   More awake today.  Objective    Blood pressure (!) 127/90, pulse (!) 110, temperature 99.7 F (37.6 C), temperature source Bladder, resp. rate (!) 23, height 6' 5 (1.956 m), weight 82.9 kg, SpO2 96%.        Intake/Output Summary (Last 24 hours) at 08/01/2024 1002 Last data filed at 08/01/2024 0600 Gross per 24 hour  Intake 979.02 ml  Output 1355 ml  Net -375.98 ml   Filed Weights   07/28/24 0500 07/29/24 0500  08/01/24 0500  Weight: 84.8 kg 83.5 kg 82.9 kg    Examination:  Gen:   Chronically ill-appearing HEENT:  EOMI, sclera anicteric Neck:     No masses; no thyromegaly Lungs:    Clear to auscultation bilaterally; normal respiratory effort CV:         Regular rate and rhythm; no murmurs Abd:      + bowel sounds; soft, non-tender; no palpable masses, no distension Ext:    No edema; adequate peripheral perfusion Neuro: More awake.  Awake, oriented x 2  BC 10/5 > staph haemolyticus in aerobic bottle only Urine culture > 60k klebsiella - ESBL (sens to gentamicin and meropenem only) Echo LVEF < 20%m Grade 2 DD. Mod-Sev MR, Mod TR, Dilated IVC.  Labs significant for WBC count 16.4, hemoglobin 11  Assessment and Plan  Acute resp failure Pulmonary edema vs multifocal PNA - RVP and urine strep neg Growing Klebsiella in sputum Encephalopathy, presumed metabolic. Now agitated at times, but very sensitive to sedatives.  -One way extubation 10/9 -More lethargic today. Check VBG -Bronchial hygiene and RT/bronchodilator protocol  Subacute CVA - MRI brain with multiple non-hemorrhagic embolic infarcts in bilateral cerebral cortex, left thalamus, right cerebellar hemisphere. - Neuro has signed off - Continue heparin  considering cardiomyopathy - continue ASA, Atorvastatin  Septic encephalopathy - Slowly improving - Swallow eval.  Start clears - TSH, ammonia wnl  Septic shock- resolved UTI - ESBL Klebsiella. Chronic foley.  Possible HCAP BC -1 bottle blood growing staph hemolyticus. Likely a contaminant.  - Continue meropenem - On midodrine  to 5mg  (home dose is 10) Keep.  NSTEMI HFrEF, end stage - echo 10/6  with EF <20% and GIIDD. Elevated troponins  Chronic hypotension  - Coming off heparin  today  - Continue PTA midodrine  - Not a cath candidate  AKI - resolved Hypocalcemia Hypomagnesemia BPH  -Supportive care -follow renal indices, UOP  Elevated LFTs - shock state - Trend LFTs  intermittently  Schizophrenia  -resume meds as appropriate  - Home clozapine  on hold but does have withdrawal potention so we should consider restarting when we can. Will need follow restart protocol with lower dose.  Clozapine  will prolong QTC but has very mild effect per pharmacy.   GOC -Appreciate PMT assistance. Care to be coordinated with legal guardian only. Code status changed to DNR. One way extubation 10/9.  Called legal guardian 10/10, 10/11 with no answer.  Left voicemail with update  Stable to move out of the ICU and to hospitalist service  Critical care time: NA   Lonna Coder MD [facility] See Amion for pager  If no response to pager , please call [phone] until 7pm After 7:00 pm call Elink  [phone] 08/01/2024, 10:02 AM

## 2024-08-01 NOTE — Progress Notes (Addendum)
 Palliative:  HPI: 68 y.o. male  with past medical history of  schizophrenia and intellectual disability, HFrEF with EF <20% on last echo, hx of BPH and bladder outlet obstruction with indwelling foley and multiple recent admissions and ED visits for foley obstruction and UTI's, most recent discharge 9/20  admitted on 07/26/2024 with respiratory distress and pin point pupils and was intubated in the ED. Labs significant for elevated lactic acid, troponin and WBC of 20k. CXR with patchy opacities and edema.  CTH revealed hypodensity in the anterior L MCA c/f subacute infarct. PMT has been consulted to assist with goals of care conversation as Legal Guardian and patient face treatment options, complex medical decisions, and anticipatory care needs.    Discussed with RN. I met today at Jordan Kennedy's bedside. He is fully awake and interactive. Significant change and improvement from yesterday. He is awake and able to communicate. He is confused and oriented to self only. Asking for food and drink. Not struggling with secretions as he was yesterday. Plans to pursue SLP evaluation today. Anticipate he may actually do okay with intake. I worry his limitation will be his fluctuating mentation. Time for outcomes.   Emotional support provided to Mr. Abran. Turned on tv which he seemed to be enjoying.   Exam: Awake, alert, following commands. Oriented x1. Tells me the year is 2000. Breathing regular, unlabored. Abd soft. Warm to touch.   Plan: - DNR/DNI - SLP eval - careful with diet and fluctuating mentation - Delirium precautions - Time for outcomes  25 min  Bernarda Kitty, NP Palliative Medicine Team Pager [phone] (Please see [email or web address] for schedule) Team Phone [phone]

## 2024-08-01 NOTE — Progress Notes (Signed)
 PHARMACY - ANTICOAGULATION  Pharmacy Consult for heparin   Indication: chest pain/ACS and stroke  No Known Allergies  Patient Measurements: Height: 6' 5 (195.6 cm) Weight: 82.9 kg (182 lb 12.2 oz) IBW/kg (Calculated) : 89.1 HEPARIN  DW (KG): 82  Vital Signs: Temp: 99.7 F (37.6 C) (10/11 0800) Temp Source: Bladder (10/11 0800) BP: 127/90 (10/11 0800) Pulse Rate: 110 (10/11 0800)  Labs: Recent Labs    07/30/24 0445 07/31/24 0517 07/31/24 1701 08/01/24 0701  HGB 11.1* 11.3*  --  11.0*  HCT 34.9* 37.9*  --  34.4*  PLT 266 240  --  333  HEPARINUNFRC 0.33 0.27* 0.51 0.35  CREATININE 1.11 1.05  --  0.98    Estimated Creatinine Clearance: 86.9 mL/min (by C-G formula based on SCr of 0.98 mg/dL).   Assessment: 68 y/o  male admitted for respiratory distress requiring intubation, NSTEMI and CVA (likely cardioembolic etiology), for heparin .   HL 0.33 wnl. CBC stable. No bleeding or infusion concerns per RN.   Goal of Therapy:  Heparin  level 0.3-0.5 units/ml Monitor platelets by anticoagulation protocol: Yes   Plan:  Continue heparin  infusion at 1900 units/hr Check heparin  level daily while on heparin  Continue to monitor H&H and platelets  Thank you for allowing pharmacy to be a part of this patient's care.  Shelba Collier, PharmD, BCPS Clinical Pharmacist

## 2024-08-01 NOTE — Progress Notes (Signed)
 Pharmacy Electrolyte Replacement  Recent Labs:  Recent Labs    08/01/24 0701  K 3.7  MG 1.8  PHOS 2.1*  CREATININE 0.98    Low Critical Values (K </= 2.5, Phos </= 1, Mg </= 1) Present: None  MD Contacted: n/a  Plan:  Give Mg 2g IV x1 Give KPhos 30 mmol IV x1  Thank you for allowing pharmacy to be a part of this patient's care.  Shelba Collier, PharmD, BCPS Clinical Pharmacist

## 2024-08-02 DIAGNOSIS — I5023 Acute on chronic systolic (congestive) heart failure: Secondary | ICD-10-CM

## 2024-08-02 DIAGNOSIS — A419 Sepsis, unspecified organism: Secondary | ICD-10-CM | POA: Diagnosis not present

## 2024-08-02 DIAGNOSIS — I214 Non-ST elevation (NSTEMI) myocardial infarction: Secondary | ICD-10-CM | POA: Diagnosis not present

## 2024-08-02 DIAGNOSIS — R0603 Acute respiratory distress: Secondary | ICD-10-CM | POA: Diagnosis not present

## 2024-08-02 LAB — BASIC METABOLIC PANEL WITH GFR
Anion gap: 8 (ref 5–15)
BUN: 10 mg/dL (ref 8–23)
CO2: 23 mmol/L (ref 22–32)
Calcium: 8 mg/dL — ABNORMAL LOW (ref 8.9–10.3)
Chloride: 108 mmol/L (ref 98–111)
Creatinine, Ser: 0.86 mg/dL (ref 0.61–1.24)
GFR, Estimated: >60 mL/min (ref >60)
Glucose, Bld: 110 mg/dL — ABNORMAL HIGH (ref 70–99)
Potassium: 3.6 mmol/L (ref 3.5–5.1)
Sodium: 139 mmol/L (ref 135–145)

## 2024-08-02 LAB — GLUCOSE, CAPILLARY
Glucose-Capillary: 105 mg/dL — ABNORMAL HIGH (ref 70–99)
Glucose-Capillary: 106 mg/dL — ABNORMAL HIGH (ref 70–99)
Glucose-Capillary: 108 mg/dL — ABNORMAL HIGH (ref 70–99)
Glucose-Capillary: 117 mg/dL — ABNORMAL HIGH (ref 70–99)
Glucose-Capillary: 117 mg/dL — ABNORMAL HIGH (ref 70–99)
Glucose-Capillary: 96 mg/dL (ref 70–99)
Glucose-Capillary: 96 mg/dL (ref 70–99)

## 2024-08-02 LAB — CBC
HCT: 34.2 % — ABNORMAL LOW (ref 39.0–52.0)
Hemoglobin: 11 g/dL — ABNORMAL LOW (ref 13.0–17.0)
MCH: 29.6 pg (ref 26.0–34.0)
MCHC: 32.2 g/dL (ref 30.0–36.0)
MCV: 91.9 fL (ref 80.0–100.0)
Platelets: 412 K/uL — ABNORMAL HIGH (ref 150–400)
RBC: 3.72 MIL/uL — ABNORMAL LOW (ref 4.22–5.81)
RDW: 19.7 % — ABNORMAL HIGH (ref 11.5–15.5)
WBC: 19.2 K/uL — ABNORMAL HIGH (ref 4.0–10.5)
nRBC: 0 % (ref 0.0–0.2)

## 2024-08-02 LAB — HEPARIN LEVEL (UNFRACTIONATED): Heparin Unfractionated: 0.26 [IU]/mL — ABNORMAL LOW (ref 0.30–0.70)

## 2024-08-02 MED ORDER — THIAMINE MONONITRATE 100 MG PO TABS
100.0000 mg | ORAL_TABLET | Freq: Every day | ORAL | Status: DC
  Administered 2024-08-02 – 2024-08-07 (×6): 100 mg via ORAL
  Filled 2024-08-02 (×6): qty 1

## 2024-08-02 MED ORDER — DOCUSATE SODIUM 100 MG PO CAPS: 100.0000 mg | ORAL_CAPSULE | Freq: Two times a day (BID) | ORAL | Status: DC | PRN

## 2024-08-02 MED ORDER — POLYETHYLENE GLYCOL 3350 17 G PO PACK: 17.0000 g | PACK | Freq: Every day | ORAL | Status: DC | PRN

## 2024-08-02 MED ORDER — POLYETHYLENE GLYCOL 3350 17 G PO PACK: 17.0000 g | PACK | Freq: Every day | ORAL | Status: DC

## 2024-08-02 NOTE — Progress Notes (Signed)
 Brief Pharmacy Note  RN alerted pharmacy that urine is red, though this had been documented prior to transfer to floor from ICU.  This am heparin  level is slightly low and Hgb is stable.  Will continue heparin  at current rate for now and monitor before adjusting rate.  Marvetta Dauphin, PharmD, BCPS 08/02/2024 6:10 AM

## 2024-08-02 NOTE — Progress Notes (Signed)
 Contacted by attending physician regarding duration of treatment with intravenous heparin  in this patient who presented with heart exacerbation, moderate troponin elevation, but now has hematuria.  No benefit for ongoing therapeutic dose heparin  beyond 40-72 hours, even if he did have an acute coronary event.  IV heparin  should be stopped at this point.  It would be reasonable to treat with dual antiplatelet therapy, but again in the setting of active bleeding is probably best treated with aspirin  81 mg once daily only.  He has longstanding history of dilated cardiomyopathy with severely depressed ejection fraction, but after previous assessments that decision has been made that he is not a candidate for invasive procedures such as coronary angiography, percutaneous revascularization, defibrillator, etc. due to his comorbid conditions and inability to follow instructions.  He requires midodrine  for blood pressure support.  He is not a candidate for aggressive guideline directed medical therapy for heart failure.  If blood pressure improves, it is reasonable to resume low-dose metoprolol  succinate and Entresto , which she was receiving prior to admission.

## 2024-08-02 NOTE — Progress Notes (Signed)
 [facility]  PROGRESS NOTE  Jordan Kennedy FMW:[id number] DOB: 23-May-1956 DOA: 07/26/2024 PCP: [name] HERO, MD   Brief HPI:    68 y.o. M with PMH schizophrenia and intellectual disability, HFrEF with EF <20% on last echo, hx of BPH and bladder outlet obstruction with indwelling foley and multiple recent admissions and ED visits for foley obstruction and UTI's, most recent discharge 9/20 who presented with respiratory distress and pin point pupils and was intubated in the ED. Labs significant for elevated lactic acid, troponin and WBC of 20k. CXR with patchy opacities and edema and CT head pending. PCCM consulted for admission.    Assessment/Plan:    Acute resp failure Pulmonary edema vs multifocal PNA - RVP and urine strep neg Growing Klebsiella in sputum Encephalopathy, presumed metabolic. Now agitated at times, but very sensitive to sedatives.  -One way extubation 10/9 -Bronchial hygiene and RT/bronchodilator protocol   Subacute CVA - MRI brain with multiple non-hemorrhagic embolic infarcts in bilateral cerebral cortex, left thalamus, right cerebellar hemisphere. - Neuro has signed off - Continue heparin  considering cardiomyopathy - continue ASA, Atorvastatin  Hematuria -Developed hematuria while being on heparin  -Discussed with cardiology, Dr. Francyne ;no benefit of continue with heparin  beyond 48 hours -Not a candidate for aggressive interventions including cardiac catheterization, defibrillator placement -Will discontinue heparin    Septic encephalopathy - Slowly improving - Swallow eval.  Start clears - TSH, ammonia wnl   Septic shock- resolved UTI - ESBL Klebsiella. Chronic foley.  Possible HCAP BC -1 bottle blood growing staph hemolyticus. Likely a contaminant.  - Continue meropenem - On midodrine  to 5mg  (home dose is 10)    NSTEMI HFrEF, end stage - echo 10/6 with EF <20% and GIIDD. Elevated troponins  Chronic hypotension  - Heparin  discontinued due to  hematuria as above - Continue PTA midodrine  - Not a cath candidate   AKI - resolved Hypocalcemia Hypomagnesemia BPH  -Supportive care -follow renal indices, UOP   Elevated LFTs - shock state - Trend LFTs intermittently   Schizophrenia  -resume meds as appropriate  - Home clozapine  on hold but does have withdrawal potention so we should consider restarting when we can. Will need follow restart protocol with lower dose.  Clozapine  will prolong QTC but has very mild effect per pharmacy.    GOC -Appreciate PMT assistance. Care to be coordinated with legal guardian only. Code status changed to DNR. One way extubation 10/9.     Medications     aspirin   81 mg Oral Daily   atorvastatin  80 mg Oral Daily   Chlorhexidine  Gluconate Cloth  6 each Topical Daily   feeding supplement (PROSource TF20)  60 mL Per Tube BID   insulin aspart  0-9 Units Subcutaneous Q4H   midodrine   5 mg Oral Q8H   multivitamin with minerals  1 tablet Oral Daily   PARoxetine   40 mg Oral Daily   polyethylene glycol  17 g Oral Daily   thiamine  100 mg Oral Daily     Data Reviewed:   CBG:  Recent Labs  Lab 08/01/24 1107 08/01/24 1511 08/01/24 1940 08/01/24 2357 08/02/24 0419  GLUCAP 103* 100* 117* 117* 106*    SpO2: 96 % O2 Flow Rate (L/min): 4 L/min FiO2 (%): 40 %    Vitals:   08/01/24 2200 08/01/24 2352 08/02/24 0420 08/02/24 0717  BP: 94/74 104/71 103/78 122/85  Pulse: 94 93 96 98  Resp: (!) 22 20 18 20   Temp:  98.5 F (36.9 C) 99.1  F (37.3 C)   TempSrc:  Oral Oral   SpO2: 93% 94% 95% 96%  Weight:  84.2 kg 84.2 kg   Height:  6' 5 (1.956 m)        Data Reviewed:  Basic Metabolic Panel: Recent Labs  Lab 07/28/24 0858 07/29/24 0330 07/30/24 0445 07/31/24 0517 08/01/24 0701 08/02/24 0418  NA 147* 143 143 144 144 139  K 3.5 4.0 4.0 4.1 3.7 3.6  CL 115* 113* 113* 111 109 108  CO2 17* 19* 18* 23 21* 23  GLUCOSE 89 97 140* 96 86 110*  BUN 22 18 21 10 10 10   CREATININE  1.27* 1.34* 1.11 1.05 0.98 0.86  CALCIUM  8.3* 8.2* 8.1* 8.0* 8.1* 8.0*  MG 1.6* 2.3 2.1 1.8 1.8  --   PHOS 2.9 2.7 2.3* 2.4* 2.1*  --     CBC: Recent Labs  Lab 07/26/24 1730 07/26/24 1810 07/28/24 1046 07/29/24 0330 07/30/24 0445 07/31/24 0517 08/01/24 0701 08/02/24 0418  WBC 20.3*   < > 20.4* 19.1* 19.8* 15.0* 16.4* 19.2*  NEUTROABS 17.1*  --  16.5*  --   --   --   --   --   HGB 11.2*   < > 10.8* 10.4* 11.1* 11.3* 11.0* 11.0*  HCT 36.4*   < > 33.2* 32.5* 34.9* 37.9* 34.4* 34.2*  MCV 98.1   < > 93.0 94.2 95.1 101.1* 93.7 91.9  PLT 307   < > 226 231 266 240 333 412*   < > = values in this interval not displayed.    LFT Recent Labs  Lab 07/26/24 1730 07/31/24 0517  AST 99* 23  ALT 131* 62*  ALKPHOS 139* 113  BILITOT 0.6 0.9  PROT 6.4* 5.3*  ALBUMIN  3.0* 2.0*     Antibiotics: Anti-infectives (From admission, onward)    Start     Dose/Rate Route Frequency Ordered Stop   07/29/24 1400  meropenem (MERREM) 1 g in sodium chloride  0.9 % 100 mL IVPB        1 g 200 mL/hr over 30 Minutes Intravenous Every 8 hours 07/29/24 1036 08/05/24 1359   07/28/24 2200  vancomycin  (VANCOREADY) IVPB 1250 mg/250 mL  Status:  Discontinued        1,250 mg 166.7 mL/hr over 90 Minutes Intravenous Every 48 hours 07/26/24 1947 07/27/24 1000   07/27/24 1400  piperacillin -tazobactam (ZOSYN ) IVPB 3.375 g  Status:  Discontinued        3.375 g 12.5 mL/hr over 240 Minutes Intravenous Every 8 hours 07/27/24 1051 07/29/24 1036   07/26/24 1945  ceFEPIme  (MAXIPIME ) 2 g in sodium chloride  0.9 % 100 mL IVPB  Status:  Discontinued        2 g 200 mL/hr over 30 Minutes Intravenous Every 12 hours 07/26/24 1930 07/27/24 1051   07/26/24 1945  vancomycin  (VANCOREADY) IVPB 1750 mg/350 mL        1,750 mg 175 mL/hr over 120 Minutes Intravenous  Once 07/26/24 1931 07/27/24 0022   07/26/24 1900  cefTRIAXone  (ROCEPHIN ) 2 g in sodium chloride  0.9 % 100 mL IVPB        2 g 200 mL/hr over 30 Minutes Intravenous Once  07/26/24 1849 07/26/24 1932        DVT prophylaxis: Heparin   Code Status: DNR  Family Communication: No family at bedside   CONSULTS neurology, PCCM   Subjective   Denies chest pain or shortness of breath   Objective    Physical Examination:  Appears in no  acute distress S1-S2, regular, no murmur auscultated Lungs are clear to auscultation bilaterally Abdomen is soft, nontender, no organomegaly  Status is: Inpatient:             Sabas GORMAN Brod   [facility] If 7PM-7AM, please contact night-coverage at [email or web address], Office  [phone]   08/02/2024, 8:23 AM  LOS: 7 days

## 2024-08-02 NOTE — Progress Notes (Signed)
 Patient arrived to [phone] from 4NICU. Patient placed on telemetry, vitals completed. Patient reports no pain or other concerns at this time.

## 2024-08-02 NOTE — Evaluation (Addendum)
 Physical Therapy Evaluation Patient Details Name: Jordan Kennedy MRN: [id number] DOB: 1956/08/08 Today's Date: 08/02/2024  History of Present Illness  Pt is a 68 y.o. with admission in August for septic shock and UTI and discharged 8/27 with an indwelling catheter. Admitted 9/5 for constipation and possible colitis and discharged  home on 9/7. Admitted 07/26/24 with respiratory distress and pin point pupils and was intubated in the ED. 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   Clinical Impression  Pt in bed upon arrival of PT, agreeable to evaluation at this time. Pt reports that prior to this admission he was ambulating with a cane, but from his last admission he was ambulating up to 250 ft with RW. The pt reports assist from caretaker for ADLs and IADLs, no recent falls. The pt required significant assist to complete bed mobility and sit-stand transfer this session due to weakness in R extremities, impaired coordination, motor planning, command following, and awareness. Pt needing up to modA to maintain static sitting balance, and modA to manage sit-stand from EOB. Pt with strong posterior lean upon standing and reports dizziness requiring return to supine. Recommend return to inpatient therapies <3hours/day after stable to d/c.     If plan is discharge home, recommend the following: Two people to help with walking and/or transfers;Two people to help with bathing/dressing/bathroom;Assistance with cooking/housework;Assistance with feeding;Direct supervision/assist for medications management;Direct supervision/assist for financial management;Assist for transportation;Help with stairs or ramp for entrance;Supervision due to cognitive status   Can travel by private vehicle   No    Equipment Recommendations  Wheelchair (measurements PT);Wheelchair cushion (measurements PT)  Recommendations for Other Services  OT consult    Functional Status Assessment Patient has had a recent decline in their functional status and demonstrates the ability to make significant improvements in function in a reasonable and predictable amount of time.     Precautions / Restrictions Precautions Precautions: Fall Recall of Precautions/Restrictions: Impaired Precaution/Restrictions Comments: foley, watch hypotension Restrictions Weight Bearing Restrictions Per Provider Order: No      Mobility  Bed Mobility Overal bed mobility: Needs Assistance Bed Mobility: Supine to Sit, Sit to Supine     Supine to sit: Mod assist, HOB elevated, Used rails Sit to supine: Min assist   General bed mobility comments: pt needing modA to elevate trunk, to complete scooting to EOB, pt able to return to supine without assist, but needed minA to reposition    Transfers Overall transfer level: Needs assistance Equipment used: 1 person hand held assist Transfers: Sit to/from Stand Sit to Stand: Mod assist           General transfer comment: increased attempts to power up to standing, modA to rise and steady, posterior lean with initial stand and pt reports dizziness needing return to supine.    Ambulation/Gait               General Gait Details: unable to manage stepping at this time      Balance Overall balance assessment: Needs assistance Sitting-balance support: Feet supported Sitting balance-Leahy Scale: Poor Sitting balance - Comments: needing minA to modA to manage static sitting, modA to manage reaching outside BOS Postural control: Posterior lean Standing balance support: Single extremity supported, During functional activity Standing balance-Leahy Scale: Poor Standing balance comment: pt with strong posterior lean needing modA to maintain upright  Pertinent  Vitals/Pain Pain Assessment Pain Assessment: No/denies pain Faces Pain Scale: No hurt    Home Living Family/patient expects to be discharged to:: Group home Living Arrangements: Non-relatives/Friends Available Help at Discharge: Personal care attendant Type of Home: House Home Access: Stairs to enter Entrance Stairs-Rails: Doctor, general practice of Steps: 2-3   Home Layout: One level Home Equipment: Production assistant, radio - single point Additional Comments: all per pt report, no family present    Prior Function Prior Level of Function : Needs assist             Mobility Comments: pt reports use of cane, no falls ADLs Comments: pt reports assist from caretaker for dressing, bathing, and medications but also reports they come sometimes     Extremity/Trunk Assessment   Upper Extremity Assessment Upper Extremity Assessment: RUE deficits/detail;Defer to OT evaluation RUE Deficits / Details: generalized weakness, pt able to activate and hold against min resistance, poor attention and impaired coordination RUE Sensation: decreased proprioception RUE Coordination: decreased fine motor;decreased gross motor    Lower Extremity Assessment Lower Extremity Assessment: RLE deficits/detail;LLE deficits/detail RLE Deficits / Details: grossly 4-/5 to MMT, reports sensation intact. RLE Coordination: decreased fine motor;decreased gross motor LLE Deficits / Details: grossly 4/5 to MMT, reports sensation intact LLE Sensation: WNL    Cervical / Trunk Assessment Cervical / Trunk Assessment: Normal  Communication   Communication Communication: Impaired Factors Affecting Communication: Reduced clarity of speech    Cognition Arousal: Alert Behavior During Therapy: Impulsive   PT - Cognitive impairments: History of cognitive impairments                       PT - Cognition Comments: asking for food tray, decreased awareness to R side, able to follow simple commands  with increased time Following commands: Impaired Following commands impaired: Follows one step commands inconsistently, Follows one step commands with increased time     Cueing Cueing Techniques: Verbal cues, Gestural cues     General Comments General comments (skin integrity, edema, etc.): VSS on RA        Assessment/Plan    PT Assessment Patient needs continued PT services  PT Problem List Decreased balance;Decreased cognition;Decreased safety awareness;Decreased knowledge of use of DME;Decreased mobility;Decreased activity tolerance;Decreased strength;Decreased coordination       PT Treatment Interventions DME instruction;Functional mobility training;Balance training;Patient/family education;Gait training;Therapeutic exercise;Therapeutic activities;Stair training;Neuromuscular re-education    PT Goals (Current goals can be found in the Care Plan section)  Acute Rehab PT Goals Patient Stated Goal: to go home PT Goal Formulation: With patient Time For Goal Achievement: 08/16/24 Potential to Achieve Goals: Fair    Frequency Min 2X/week        AM-PAC PT 6 Clicks Mobility  Outcome Measure Help needed turning from your back to your side while in a flat bed without using bedrails?: A Lot Help needed moving from lying on your back to sitting on the side of a flat bed without using bedrails?: A Lot Help needed moving to and from a bed to a chair (including a wheelchair)?: Total Help needed standing up from a chair using your arms (e.g., wheelchair or bedside chair)?: A Lot Help needed to walk in hospital room?: Total Help needed climbing 3-5 steps with a railing? 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

## 2024-08-02 NOTE — Evaluation (Signed)
 Clinical/Bedside Swallow Evaluation Patient Details  Name: Jordan Kennedy MRN: [id number] Date of Birth: [birth year]  Today's Date: 08/02/2024 Time: SLP Start Time (ACUTE ONLY): 9077 SLP Stop Time (ACUTE ONLY): 9060 SLP Time Calculation (min) (ACUTE ONLY): 17 min  Past Medical History:  Past Medical History:  Diagnosis Date   AKI (acute kidney injury) 06/29/2018   Anemia    Anemia, iron deficiency 09/18/2015   Bowel obstruction (HCC)    BPH (benign prostatic hyperplasia)    Chronic constipation    Chronic systolic heart failure (HCC)    History of ileus    Ileus (HCC) 03/07/2017   Mental retardation    Partial bowel obstruction (HCC)    Schizophrenia (HCC)    Monarche every three months;    SIRS (systemic inflammatory response syndrome) (HCC) 06/29/2018   Small bowel obstruction (HCC) 06/29/2018   Past Surgical History: No past surgical history on file. HPI:  Pt is a 68 y.o. M who presented with respiratory distress and pin point pupils and was intubated in the ED. ETT: 10/5-10/9. Pt passed swallow screen on 10/11 and full liquid diet initiated. SLP consulted by Palliative NP for possible diet advancement with consideration of fluctuating mentation. 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 PMH: schizophrenia and intellectual disability, HFrEF with EF <20% on last echo, hx of BPH and bladder outlet obstruction with indwelling foley and multiple recent admissions and ED visits for foley obstruction and UTIs, most recent discharge 9/20    Assessment / Plan / Recommendation  Clinical Impression  Pt was seen for bedside swallow evaluation and he denied a history of dysphagia. Pt was alert and cooperative during the evaluation, but speech was significant for low vocal intensity and reduced prosodic variability. Oral mechanism exam was limited due to pt's difficulty following some  commands; however, right-sided facial weakness was noted and pt was edentulous. Pt stated that he owns dentures, but that they are not at the hospital. Pt presented with mildly prolonged mastication, and signs of aspiration with 6 consecutive swallows of thin liquids via straw, but not with fewer consecutive boluses. An oral and/or pharyngeal delay is suspected considering time of bolus presentation vs that of hyolaryngeal movement. A dysphagia 3 diet with thin liquids is recommended at this time and SLP will follow to ensure tolerance and for possible diet advancement. Considering acute infarcts, recommend speech-language-cognition evaluation if this aligns with GOC. SLP Visit Diagnosis: Dysphagia, unspecified (R13.10)    Aspiration Risk  Mild aspiration risk    Diet Recommendation Dysphagia 3 (Mech soft);Thin liquid    Liquid Administration via: Cup;Straw Medication Administration: Whole meds with puree Supervision: Staff to assist with self feeding Compensations: Slow rate;Small sips/bites Postural Changes: Seated upright at 90 degrees    Other  Recommendations       Assistance Recommended at Discharge    Functional Status Assessment Patient has had a recent decline in their functional status and demonstrates the ability to make significant improvements in function in a reasonable and predictable amount of time.  Frequency and Duration min 2x/week  2 weeks       Prognosis Prognosis for improved oropharyngeal function: Good Barriers to Reach Goals: Cognitive deficits      Swallow Study   General Date of Onset: 07/31/24 HPI: Pt is a 68 y.o. M who presented with respiratory distress and pin point pupils and was intubated in the ED. ETT: 10/5-10/9. Pt passed swallow screen on 10/11  and full liquid diet initiated. SLP consulted by Palliative NP for possible diet advancement with consideration of fluctuating mentation. MRI 07/27/24: Multiple acute non-hemorrhagic embolic infarcts involving  the bilateral cerebral cortex, left thalamus, and right cerebellar hemisphere. PMH: schizophrenia and intellectual disability, HFrEF with EF <20% on last echo, hx of BPH and bladder outlet obstruction with indwelling foley and multiple recent admissions and ED visits for foley obstruction and UTIs, most recent discharge 9/20 Type of Study: Bedside Swallow Evaluation Previous Swallow Assessment: none Diet Prior to this Study: Full liquid diet Temperature Spikes Noted: No Respiratory Status: Room air History of Recent Intubation: Yes Total duration of intubation (days): 4 days Date extubated: 07/30/24 Behavior/Cognition: Alert;Cooperative;Pleasant mood Oral Cavity Assessment: Within Functional Limits Oral Care Completed by SLP: No Oral Cavity - Dentition: Missing dentition;Edentulous (pt wears dentures, which are not at hospital) Vision: Functional for self-feeding Self-Feeding Abilities: Able to feed self Patient Positioning: Upright in bed Baseline Vocal Quality: Normal Volitional Cough: Weak Volitional Swallow: Able to elicit    Oral/Motor/Sensory Function Overall Oral Motor/Sensory Function: Mild impairment Facial ROM: Reduced right;Suspected CN VII (facial) dysfunction Facial Symmetry: Abnormal symmetry right;Suspected CN VII (facial) dysfunction Facial Strength: Reduced right;Suspected CN VII (facial) dysfunction Lingual ROM:  (Difficult to assess)   Ice Chips Ice chips: Within functional limits Presentation: Spoon   Thin Liquid Thin Liquid: Impaired Presentation: Straw Pharyngeal  Phase Impairments: Cough - Immediate;Suspected delayed Swallow    Nectar Thick Nectar Thick Liquid: Not tested   Honey Thick Honey Thick Liquid: Not tested   Puree Puree: Impaired Presentation: Spoon Oral Phase Functional Implications: Prolonged oral transit Pharyngeal Phase Impairments: Suspected delayed Swallow   Solid     Solid: Impaired Oral Phase Impairments: Impaired mastication      Jordan Dehner I. [place], MS, CCC-SLP Acute Rehabilitation Services Office number [phone]  Thea LILLETTE Kennedy 08/02/2024,10:06 AM

## 2024-08-03 DIAGNOSIS — A419 Sepsis, unspecified organism: Secondary | ICD-10-CM | POA: Diagnosis not present

## 2024-08-03 DIAGNOSIS — I214 Non-ST elevation (NSTEMI) myocardial infarction: Secondary | ICD-10-CM | POA: Diagnosis not present

## 2024-08-03 DIAGNOSIS — R0603 Acute respiratory distress: Secondary | ICD-10-CM | POA: Diagnosis not present

## 2024-08-03 DIAGNOSIS — I5023 Acute on chronic systolic (congestive) heart failure: Secondary | ICD-10-CM | POA: Diagnosis not present

## 2024-08-03 LAB — CBC
HCT: 35.7 % — ABNORMAL LOW (ref 39.0–52.0)
Hemoglobin: 11.5 g/dL — ABNORMAL LOW (ref 13.0–17.0)
MCH: 29.5 pg (ref 26.0–34.0)
MCHC: 32.2 g/dL (ref 30.0–36.0)
MCV: 91.5 fL (ref 80.0–100.0)
Platelets: 446 K/uL — ABNORMAL HIGH (ref 150–400)
RBC: 3.9 MIL/uL — ABNORMAL LOW (ref 4.22–5.81)
RDW: 19.6 % — ABNORMAL HIGH (ref 11.5–15.5)
WBC: 18.8 K/uL — ABNORMAL HIGH (ref 4.0–10.5)
nRBC: 0 % (ref 0.0–0.2)

## 2024-08-03 LAB — GLUCOSE, CAPILLARY
Glucose-Capillary: 101 mg/dL — ABNORMAL HIGH (ref 70–99)
Glucose-Capillary: 109 mg/dL — ABNORMAL HIGH (ref 70–99)
Glucose-Capillary: 113 mg/dL — ABNORMAL HIGH (ref 70–99)
Glucose-Capillary: 116 mg/dL — ABNORMAL HIGH (ref 70–99)
Glucose-Capillary: 130 mg/dL — ABNORMAL HIGH (ref 70–99)
Glucose-Capillary: 131 mg/dL — ABNORMAL HIGH (ref 70–99)

## 2024-08-03 MED ORDER — LORAZEPAM 2 MG/ML IJ SOLN
1.0000 mg | Freq: Once | INTRAMUSCULAR | Status: AC
  Administered 2024-08-03: 1 mg via INTRAVENOUS
  Filled 2024-08-03: qty 1

## 2024-08-03 MED ORDER — MORPHINE SULFATE (PF) 2 MG/ML IV SOLN
1.0000 mg | Freq: Once | INTRAVENOUS | Status: AC
  Administered 2024-08-03: 1 mg via INTRAVENOUS
  Filled 2024-08-03: qty 1

## 2024-08-03 MED ORDER — ACETAMINOPHEN 325 MG PO TABS
650.0000 mg | ORAL_TABLET | Freq: Four times a day (QID) | ORAL | Status: DC | PRN
  Administered 2024-08-03: 650 mg via ORAL
  Filled 2024-08-03: qty 2

## 2024-08-03 MED ORDER — METOPROLOL SUCCINATE ER 25 MG PO TB24
12.5000 mg | ORAL_TABLET | Freq: Every morning | ORAL | Status: DC
  Administered 2024-08-03 – 2024-08-07 (×5): 12.5 mg via ORAL
  Filled 2024-08-03 (×5): qty 1

## 2024-08-03 NOTE — TOC Progression Note (Addendum)
 Transition of Care [facility]) - Progression Note    Patient Details  Name: Jordan Kennedy MRN: [id number] Date of Birth: [birth year]  Transition of Care [facility]) CM/SW Contact  Isaiah Public, LCSWA Phone Number: 08/03/2024, 11:13 AM  Clinical Narrative:      CSW received consult for possible SNF placement at time of discharge.Due to patients current orientation CSW LVM for patients legal guardian [name]. CSW awaiting call back to discuss  PT recommendation of SNF placement at time of discharge for patient.  CSW to continue to follow and assist with discharge planning needs.    CSW LVM with [name] patients legal guardian. CSW awaiting call back.   Update-  CSW received call back from patients Legal Guardian [name] and discussed  PT recommendation of SNF placement for patient at time of discharge. Patients Guardian informed CSW that PTA patient comes from [facility] short term.Patients Guardian expressed understanding of PT recommendation and is agreeable to SNF placement for patient at time of discharge. Patients legal guardian gave CSW permission to fax out patient for SNF placement. Patients legal guardian informed CSW that plan will be for short term rehab and then patient will need LTC bed.  CSW discussed insurance authorization process. All questions answered. No further questions reported at this time. CSW to continue to follow and assist with discharge planning needs.     Barriers to Discharge: Continued Medical Work up               Expected Discharge Plan and Services                                               Social Drivers of Health (SDOH) Interventions SDOH Screenings   Food Insecurity: Patient Unable To Answer (07/02/2024)  Housing: Patient Unable To Answer (07/02/2024)  Transportation Needs: Patient Unable To Answer (07/02/2024)  Utilities: Patient Unable To Answer (07/02/2024)  Alcohol Screen: Low Risk  (11/05/2023)  Depression (PHQ2-9): Low Risk  (11/05/2023)   Financial Resource Strain: Low Risk  (11/05/2023)  Physical Activity: Inactive (11/05/2023)  Social Connections: Patient Unable To Answer (07/02/2024)  Stress: No Stress Concern Present (11/05/2023)  Tobacco Use: Medium Risk (07/01/2024)  Health Literacy: Adequate Health Literacy (11/05/2023)    Readmission Risk Interventions    07/30/2024    2:39 PM 06/28/2024   11:41 AM 06/11/2024   10:27 AM  Readmission Risk Prevention Plan  Transportation Screening Complete Complete Complete  PCP or Specialist Appt within 5-7 Days  Complete   PCP or Specialist Appt within 3-5 Days   Complete  Home Care Screening  Complete   Medication Review (RN CM)  Complete   HRI or Home Care Consult   Complete  Social Work Consult for Recovery Care Planning/Counseling   Complete  Palliative Care Screening   Not Applicable  Medication Review Oceanographer) Complete  Complete  PCP or Specialist appointment within 3-5 days of discharge Complete    HRI or Home Care Consult Complete    SW Recovery Care/Counseling Consult Complete    Palliative Care Screening Complete    Skilled Nursing Facility Complete

## 2024-08-03 NOTE — Evaluation (Signed)
 Occupational Therapy Evaluation Patient Details Name: Jordan Kennedy MRN: [id number] DOB: 1956-03-27 Today's Date: 08/03/2024   History of Present Illness   Pt is a 68 y.o. with admission in August for septic shock and UTI and discharged 8/27 with an indwelling catheter. Admitted 9/5 for constipation and possible colitis and discharged  home on 9/7. Admitted 07/26/24 with respiratory distress and pin point pupils and was intubated in the ED. 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     Clinical Impressions Pt presented at the base of EOB with nursing staff calling out for assist due to pt attempting to get OOB and had BM. Pt required max-totalx2 for hygiene due to BM for UE/LE. He required min-mod x2 for sit to stand and then was able to take lateral steps to [facility] with mod x2 and use of RW. Patient will benefit from continued inpatient follow up therapy, <3 hours/day.     If plan is discharge home, recommend the following:   Two people to help with walking and/or transfers;Two people to help with bathing/dressing/bathroom;Assistance with cooking/housework;Assist for transportation;Help with stairs or ramp for entrance     Functional Status Assessment   Patient has had a recent decline in their functional status and demonstrates the ability to make significant improvements in function in a reasonable and predictable amount of time.     Equipment Recommendations    (TBD)     Recommendations for Other Services         Precautions/Restrictions   Precautions Precautions: Fall Recall of Precautions/Restrictions: Impaired Precaution/Restrictions Comments: foley, watch hypotension Restrictions Weight Bearing Restrictions Per Provider Order: No     Mobility Bed Mobility Overal bed  mobility: Needs Assistance Bed Mobility: Supine to Sit     Supine to sit: Mod assist, HOB elevated, Used rails     General bed mobility comments: pt sitting at EOB when entering the room    Transfers Overall transfer level: Needs assistance   Transfers: Sit to/from Stand Sit to Stand: Min assist, Mod assist, +2 physical assistance, +2 safety/equipment           General transfer comment: also due to bm everywhere to ensure do not get where it is currently clean      Balance Overall balance assessment: Needs assistance Sitting-balance support: Feet supported Sitting balance-Leahy Scale: Fair Sitting balance - Comments: using side of bed to assist in sitting Postural control: Posterior lean Standing balance support: Bilateral upper extremity supported Standing balance-Leahy Scale: Poor Standing balance comment: reliant on BUE support                           ADL either performed or assessed with clinical judgement   ADL Overall ADL's : Needs assistance/impaired Eating/Feeding: Minimal assistance Eating/Feeding Details (indicate cue type and reason): spoke to PT about decrease in coordination to mouth Grooming: Wash/dry hands;Wash/dry face;Minimal assistance;Sitting   Upper Body Bathing: Sitting;Moderate assistance;Maximal assistance   Lower Body Bathing: Total assistance;Maximal assistance;Sit to/from stand;+2 for safety/equipment;+2 for physical assistance   Upper Body Dressing : Sitting;Moderate assistance;Maximal assistance   Lower Body Dressing: Maximal assistance;Total assistance;+2 for physical assistance;+2 for safety/equipment;Sit to/from stand   Toilet Transfer: Minimal assistance;Rolling walker (2 wheels);+2 for physical assistance;+2 for safety/equipment   Toileting- Clothing Manipulation and Hygiene: Total assistance;Maximal assistance;+2 for physical  assistance;+2 for safety/equipment;Sit to/from stand       Functional mobility during ADLs:  Moderate assistance;Minimal assistance;+2 for physical assistance;+2 for safety/equipment;Rolling walker (2 wheels);Cueing for sequencing;Cueing for safety       Vision   Additional Comments: difficult to assess     Perception         Praxis Praxis: Impaired Praxis Impairment Details: Motor planning, Initiation     Pertinent Vitals/Pain Pain Assessment Pain Assessment: No/denies pain Faces Pain Scale: No hurt Breathing: normal Negative Vocalization: none Facial Expression: smiling or inexpressive Body Language: relaxed Consolability: no need to console PAINAD Score: 0 Facial Expression: Relaxed, neutral Body Movements: Absence of movements Muscle Tension: Relaxed Compliance with ventilator (intubated pts.): N/A Vocalization (extubated pts.): N/A CPOT Total: 0     Extremity/Trunk Assessment Upper Extremity Assessment Upper Extremity Assessment: Difficult to assess due to impaired cognition;RUE deficits/detail;LUE deficits/detail (noted decrease in BUE coordination of UE to complete tasks, decrease in BUE grip) RUE Deficits / Details: generalized weakness, pt able to activate and hold against min resistance, poor attention and impaired coordination RUE Sensation: decreased proprioception RUE Coordination: decreased fine motor;decreased gross motor LUE Deficits / Details: Pt noted decrease in motor planning/coordination LUE Coordination: decreased fine motor;decreased gross motor       Cervical / Trunk Assessment Cervical / Trunk Assessment: Kyphotic   Communication Communication Communication: Impaired Factors Affecting Communication: Reduced clarity of speech   Cognition Arousal: Alert Behavior During Therapy: Impulsive Cognition: History of cognitive impairments, Cognition impaired, No family/caregiver present to determine baseline   Orientation impairments: Situation, Time Awareness: Intellectual awareness impaired, Online awareness impaired Memory impairment  (select all impairments): Short-term memory, Working [employer] fast streamer, Non-declarative long-term memory, Geneticist, molecular long-term memory Attention impairment (select first level of impairment): Selective attention Executive functioning impairment (select all impairments): Organization, Sequencing, Reasoning, Problem solving                   Following commands: Impaired Following commands impaired: Follows one step commands inconsistently, Follows one step commands with increased time     Cueing  General Comments   Cueing Techniques: Verbal cues;Gestural cues      Exercises     Shoulder Instructions      Home Living Family/patient expects to be discharged to:: Group home Living Arrangements: Non-relatives/Friends Available Help at Discharge: Personal care attendant Type of Home: House Home Access: Stairs to enter [employer] of Steps: 2-3 Entrance Stairs-Rails: Right;Left Home Layout: One level     Bathroom Shower/Tub: Walk-in shower         Home Equipment: Production assistant, radio - single point   Additional Comments: all per pt report, no family present      Prior Functioning/Environment Prior Level of Function : Needs assist             Mobility Comments: pt reports use of cane, no falls ADLs Comments: pt reports assist from caretaker for dressing, bathing, and medications but also reports they come sometimes    OT Problem List: Decreased strength;Decreased range of motion;Decreased activity tolerance;Decreased safety awareness;Decreased knowledge of use of DME or AE   OT Treatment/Interventions: Self-care/ADL training;Therapeutic exercise;DME and/or AE instruction;Therapeutic activities;Patient/family education;Balance training      OT Goals(Current goals can be found in the care plan section)   Acute Rehab OT Goals Patient Stated Goal: none OT Goal Formulation: With patient Time For Goal Achievement: 08/17/24 Potential to Achieve Goals: Fair    OT Frequency:  Min 2X/week    Co-evaluation  AM-PAC OT 6 Clicks Daily Activity     Outcome Measure Help from another person eating meals?: A Little Help from another person taking care of personal grooming?: A Little Help from another person toileting, which includes using toliet, bedpan, or urinal?: Total Help from another person bathing (including washing, rinsing, drying)?: A Lot Help from another person to put on and taking off regular upper body clothing?: A Little Help from another person to put on and taking off regular lower body clothing?: A Lot 6 Click Score: 14   End of Session Equipment Utilized During Treatment: Rolling walker (2 wheels) Nurse Communication: Mobility status  Activity Tolerance: Patient tolerated treatment well Patient left: in bed;with call bell/phone within reach;with bed alarm set;with nursing/sitter in room  OT Visit Diagnosis: Unsteadiness on feet (R26.81);Other abnormalities of gait and mobility (R26.89);Repeated falls (R29.6);Muscle weakness (generalized) (M62.81)                Time: 8997-8965 OT Time Calculation (min): 32 min Charges:  OT General Charges $OT Visit: 1 Visit OT Evaluation $OT Eval Moderate Complexity: 1 Mod OT Treatments $Self Care/Home Management : 8-22 mins  Warrick POUR OTR/L  Acute Rehab Services  [phone] office number   Warrick Berber 08/03/2024, 10:48 AM

## 2024-08-03 NOTE — Progress Notes (Signed)
 Nutrition Follow-up  DOCUMENTATION CODES:   Severe malnutrition in context of chronic illness  INTERVENTION:  DYS 3 + thin liquids  Encourage PO intake  MVI w/minerals, thiamine daily   NUTRITION DIAGNOSIS:   Severe Malnutrition related to chronic illness as evidenced by severe muscle depletion, severe fat depletion, NPO status, percent weight loss (35 lb weight loss, 16% in 3 months.).  GOAL:   Patient will meet greater than or equal to 90% of their needs   MONITOR:   PO intake, Labs, Weight trends  REASON FOR ASSESSMENT:   Consult Enteral/tube feeding initiation and management  ASSESSMENT:  68 year old male here with severe sepsis with septic shock in the setting of UTI and bilateral multifocal pneumonia, complicated with bilateral embolic strokes. PMH of CHF- EF <20%, BPH, schizophrenia, intellectual disability, recurrent UTIs.  10/5 intubated  10/9 extubated  Patient extubated 10/9 and OGT removed, tube feeds discontinued. Started on PO diet 10/11, advanced to DYS 3 yesterday by speech, . Patient seen in room, oriented x 2. Pt unable to recall usual body weight, does not remember how he was eating at home prior to hospitalization. Pt reports tolerating current diet, 90% of lunch tray eaten at bedside. Pt declines need for ensure drinks at this time. No nausea/vomiting reported.   Meds: MVI w/minerals, miralax , thiamine, SSI 0-9 units q 4 hrs   Labs: Glu 110, Calcium  8.0, CBG 101-116  Admit weight: 82 kg   Current weight: 79.9 kg Weight Information (since admission)     Date/Time Weight Weight in lbs BSA (Calculated - sq m) BMI (Calculated) Who   08/03/24 0407 79.9 kg 176.15 lbs -- 20.88 GB   08/02/24 0420 84.2 kg 185.63 lbs -- 22.01 GB   08/01/24 2352 84.2 kg 185.63 lbs 2.14 sq meters 22.01 SF   08/01/24 0500 82.9 kg 182.76 lbs -- 21.67 OG   07/29/24 0500 83.5 kg 184.08 lbs -- 21.82 RK   07/28/24 0500 84.8 kg 186.95 lbs -- 22.16 MS   07/26/24 18:36:58 82 kg  180.78 lbs 2.11 sq meters 21.43 JW   07/26/24 1725 82 kg 180.78 lbs -- 21.43 JO         Intake/Output Summary (Last 24 hours) at 08/03/2024 1630 Last data filed at 08/03/2024 1000 Gross per 24 hour  Intake 480 ml  Output 2775 ml  Net -2295 ml   Net IO Since Admission: -5,115.95 mL [08/03/24 1630]    NUTRITION - FOCUSED PHYSICAL EXAM:  Flowsheet Row Most Recent Value  Orbital Region Severe depletion  Upper Arm Region No depletion  Thoracic and Lumbar Region No depletion  Buccal Region Severe depletion  Temple Region Severe depletion  Clavicle Bone Region Severe depletion  Clavicle and Acromion Bone Region Severe depletion  Scapular Bone Region Moderate depletion  Dorsal Hand Unable to assess  Patellar Region Moderate depletion  Anterior Thigh Region Moderate depletion  Posterior Calf Region Unable to assess  Edema (RD Assessment) Mild  Hair Reviewed  Eyes Unable to assess  Mouth Unable to assess  Skin Reviewed  Nails Unable to assess    Diet Order:   Diet Order             DIET DYS 3 Room service appropriate? Yes with Assist; Fluid consistency: Thin  Diet effective now                   EDUCATION NEEDS:   Not appropriate for education at this time  Skin:  Skin Assessment: Skin Integrity Issues:  Skin Integrity Issues:: Stage I Stage I: Buttocks  Last BM:  10/13 type 6  Height:   Ht Readings from Last 1 Encounters:  08/01/24 6' 5 (1.956 m)    Weight:   Wt Readings from Last 1 Encounters:  08/03/24 79.9 kg    Ideal Body Weight:  94.5 kg  BMI:  Body mass index is 20.89 kg/m.  Estimated Nutritional Needs:   Kcal:  2300-2500 kcal  Protein:  120-140 gm  Fluid:  >2L/day   Evalise Abruzzese, MS, RD, LDN Clinical Dietitian  Contact via secure chat. If unavailable, use group chat RD Inpatient.

## 2024-08-03 NOTE — Progress Notes (Signed)
 [facility]  PROGRESS NOTE  Jordan Kennedy FMW:[id number] DOB: [birth year] DOA: 07/26/2024 PCP: [name] HERO, MD   Brief HPI:    68 y.o. M with PMH schizophrenia and intellectual disability, HFrEF with EF <20% on last echo, hx of BPH and bladder outlet obstruction with indwelling foley and multiple recent admissions and ED visits for foley obstruction and UTI's, most recent discharge 9/20 who presented with respiratory distress and pin point pupils and was intubated in the ED. Labs significant for elevated lactic acid, troponin and WBC of 20k. CXR with patchy opacities and edema and CT head pending. PCCM consulted for admission.    Assessment/Plan:    Acute resp failure Pulmonary edema vs multifocal PNA - RVP and urine strep neg Growing Klebsiella in sputum Encephalopathy, presumed metabolic. Now agitated at times, but very sensitive to sedatives.  -One way extubation 10/9 -Bronchial hygiene and RT/bronchodilator protocol   Subacute CVA - MRI brain with multiple non-hemorrhagic embolic infarcts in bilateral cerebral cortex, left thalamus, right cerebellar hemisphere. - Neuro has signed off - Continue heparin  considering cardiomyopathy - continue ASA, Atorvastatin  Sinus tachycardia - Patient was taking metoprolol  12.5 mg daily at home. - He has been tachycardic in the hospital, will restart metoprolol  12.5 mg daily from tonight.  Toradol  Hematuria -Developed hematuria while being on heparin  -Discussed with cardiology, Dr. Francyne ;no benefit of continue with heparin  beyond 48 hours -Not a candidate for aggressive interventions including cardiac catheterization, defibrillator placement -Will discontinue heparin    Septic encephalopathy - Slowly improving - Swallow eval.  Start clears - TSH, ammonia wnl   Septic shock- resolved UTI - ESBL Klebsiella. Chronic foley.  Possible HCAP BC -1 bottle blood growing staph hemolyticus. Likely a contaminant.  - Continue  meropenem - On midodrine  to 5mg  (home dose is 10)    NSTEMI HFrEF, end stage - echo 10/6 with EF <20% and GIIDD. Elevated troponins  Chronic hypotension  - Heparin  discontinued due to hematuria as above - Continue PTA midodrine  - Not a cath candidate   AKI - resolved Hypocalcemia Hypomagnesemia BPH  -Supportive care -follow renal indices, UOP   Elevated LFTs - shock state - Trend LFTs intermittently   Schizophrenia  -resume meds as appropriate  - Home clozapine  on hold but does have withdrawal potention so we should consider restarting when we can. Will need follow restart protocol with lower dose.  Clozapine  will prolong QTC but has very mild effect per pharmacy.    GOC -Appreciate PMT assistance. Care to be coordinated with legal guardian only. Code status changed to DNR. One way extubation 10/9.     Medications     aspirin   81 mg Oral Daily   atorvastatin  80 mg Oral Daily   Chlorhexidine  Gluconate Cloth  6 each Topical Daily   insulin aspart  0-9 Units Subcutaneous Q4H   midodrine   5 mg Oral Q8H   multivitamin with minerals  1 tablet Oral Daily   PARoxetine   40 mg Oral Daily   polyethylene glycol  17 g Oral Daily   thiamine  100 mg Oral Daily     Data Reviewed:   CBG:  Recent Labs  Lab 08/02/24 1236 08/02/24 1631 08/02/24 2043 08/02/24 2351 08/03/24 0413  GLUCAP 96 96 117* 105* 109*    SpO2: 97 % O2 Flow Rate (L/min): 4 L/min FiO2 (%): 40 %    Vitals:   08/02/24 1752 08/02/24 1950 08/02/24 2352 08/03/24 0407  BP: 102/78 112/89 111/87 113/89  Pulse:  100 (!) 106 (!) 103 97  Resp: (!) 25 20 18 18   Temp: 98.1 F (36.7 C) 98 F (36.7 C) 98.5 F (36.9 C) 98.9 F (37.2 C)  TempSrc: Oral Oral Oral Oral  SpO2: 95%  96% 97%  Weight:    79.9 kg  Height:          Data Reviewed:  Basic Metabolic Panel: Recent Labs  Lab 07/28/24 0858 07/29/24 0330 07/30/24 0445 07/31/24 0517 08/01/24 0701 08/02/24 0418  NA 147* 143 143 144 144 139  K  3.5 4.0 4.0 4.1 3.7 3.6  CL 115* 113* 113* 111 109 108  CO2 17* 19* 18* 23 21* 23  GLUCOSE 89 97 140* 96 86 110*  BUN 22 18 21 10 10 10   CREATININE 1.27* 1.34* 1.11 1.05 0.98 0.86  CALCIUM  8.3* 8.2* 8.1* 8.0* 8.1* 8.0*  MG 1.6* 2.3 2.1 1.8 1.8  --   PHOS 2.9 2.7 2.3* 2.4* 2.1*  --     CBC: Recent Labs  Lab 07/28/24 1046 07/29/24 0330 07/30/24 0445 07/31/24 0517 08/01/24 0701 08/02/24 0418 08/03/24 0513  WBC 20.4*   < > 19.8* 15.0* 16.4* 19.2* 18.8*  NEUTROABS 16.5*  --   --   --   --   --   --   HGB 10.8*   < > 11.1* 11.3* 11.0* 11.0* 11.5*  HCT 33.2*   < > 34.9* 37.9* 34.4* 34.2* 35.7*  MCV 93.0   < > 95.1 101.1* 93.7 91.9 91.5  PLT 226   < > 266 240 333 412* 446*   < > = values in this interval not displayed.    LFT Recent Labs  Lab 07/31/24 0517  AST 23  ALT 62*  ALKPHOS 113  BILITOT 0.9  PROT 5.3*  ALBUMIN  2.0*     Antibiotics: Anti-infectives (From admission, onward)    Start     Dose/Rate Route Frequency Ordered Stop   07/29/24 1400  meropenem (MERREM) 1 g in sodium chloride  0.9 % 100 mL IVPB        1 g 200 mL/hr over 30 Minutes Intravenous Every 8 hours 07/29/24 1036 08/05/24 1359   07/28/24 2200  vancomycin  (VANCOREADY) IVPB 1250 mg/250 mL  Status:  Discontinued        1,250 mg 166.7 mL/hr over 90 Minutes Intravenous Every 48 hours 07/26/24 1947 07/27/24 1000   07/27/24 1400  piperacillin -tazobactam (ZOSYN ) IVPB 3.375 g  Status:  Discontinued        3.375 g 12.5 mL/hr over 240 Minutes Intravenous Every 8 hours 07/27/24 1051 07/29/24 1036   07/26/24 1945  ceFEPIme  (MAXIPIME ) 2 g in sodium chloride  0.9 % 100 mL IVPB  Status:  Discontinued        2 g 200 mL/hr over 30 Minutes Intravenous Every 12 hours 07/26/24 1930 07/27/24 1051   07/26/24 1945  vancomycin  (VANCOREADY) IVPB 1750 mg/350 mL        1,750 mg 175 mL/hr over 120 Minutes Intravenous  Once 07/26/24 1931 07/27/24 0022   07/26/24 1900  cefTRIAXone  (ROCEPHIN ) 2 g in sodium chloride  0.9 % 100 mL  IVPB        2 g 200 mL/hr over 30 Minutes Intravenous Once 07/26/24 1849 07/26/24 1932        DVT prophylaxis: Heparin   Code Status: DNR  Family Communication: No family at bedside   CONSULTS neurology, PCCM   Subjective   Patient has been tachycardic.  Also complained of abdominal pain.  Objective    Physical Examination:  General-appears in no acute distress Heart-S1-S2, regular, no murmur auscultated Lungs-clear to auscultation bilaterally, no wheezing or crackles auscultated Abdomen-soft, nontender, no organomegaly Extremities-no edema in the lower extremities Neuro-alert, oriented x3, no focal deficit noted Status is: Inpatient:             Sabas GORMAN Brod   [facility] If 7PM-7AM, please contact night-coverage at [email or web address], Office  [phone]   08/03/2024, 7:54 AM  LOS: 8 days

## 2024-08-04 ENCOUNTER — Inpatient Hospital Stay (HOSPITAL_COMMUNITY)

## 2024-08-04 DIAGNOSIS — I639 Cerebral infarction, unspecified: Secondary | ICD-10-CM

## 2024-08-04 DIAGNOSIS — Z515 Encounter for palliative care: Secondary | ICD-10-CM | POA: Diagnosis not present

## 2024-08-04 LAB — GLUCOSE, CAPILLARY
Glucose-Capillary: 102 mg/dL — ABNORMAL HIGH (ref 70–99)
Glucose-Capillary: 126 mg/dL — ABNORMAL HIGH (ref 70–99)
Glucose-Capillary: 117 mg/dL — ABNORMAL HIGH (ref 70–99)
Glucose-Capillary: 112 mg/dL — ABNORMAL HIGH (ref 70–99)
Glucose-Capillary: 122 mg/dL — ABNORMAL HIGH (ref 70–99)

## 2024-08-04 LAB — CBC
HCT: 43.9 % (ref 39.0–52.0)
Hemoglobin: 13.8 g/dL (ref 13.0–17.0)
MCH: 29.3 pg (ref 26.0–34.0)
MCHC: 31.4 g/dL (ref 30.0–36.0)
MCV: 93.2 fL (ref 80.0–100.0)
Platelets: 558 K/uL — ABNORMAL HIGH (ref 150–400)
RBC: 4.71 MIL/uL (ref 4.22–5.81)
RDW: 19.9 % — ABNORMAL HIGH (ref 11.5–15.5)
WBC: 24.3 K/uL — ABNORMAL HIGH (ref 4.0–10.5)
nRBC: 0 % (ref 0.0–0.2)

## 2024-08-04 LAB — COMPREHENSIVE METABOLIC PANEL WITH GFR
ALT: 40 U/L (ref 0–44)
AST: 33 U/L (ref 15–41)
Albumin: 2.4 g/dL — ABNORMAL LOW (ref 3.5–5.0)
Alkaline Phosphatase: 116 U/L (ref 38–126)
Anion gap: 10 (ref 5–15)
BUN: 19 mg/dL (ref 8–23)
CO2: 21 mmol/L — ABNORMAL LOW (ref 22–32)
Calcium: 8.9 mg/dL (ref 8.9–10.3)
Chloride: 111 mmol/L (ref 98–111)
Creatinine, Ser: 1.18 mg/dL (ref 0.61–1.24)
GFR, Estimated: >60 mL/min (ref >60)
Glucose, Bld: 110 mg/dL — ABNORMAL HIGH (ref 70–99)
Potassium: 4.4 mmol/L (ref 3.5–5.1)
Sodium: 142 mmol/L (ref 135–145)
Total Bilirubin: 1 mg/dL (ref 0.0–1.2)
Total Protein: 6.4 g/dL — ABNORMAL LOW (ref 6.5–8.1)

## 2024-08-04 MED ORDER — LORAZEPAM 2 MG/ML IJ SOLN
1.0000 mg | Freq: Once | INTRAMUSCULAR | Status: AC
  Administered 2024-08-04: 1 mg via INTRAVENOUS
  Filled 2024-08-04: qty 1

## 2024-08-04 MED ORDER — CLOZAPINE 25 MG PO TABS
50.0000 mg | ORAL_TABLET | Freq: Two times a day (BID) | ORAL | Status: DC
  Administered 2024-08-04 – 2024-08-07 (×8): 50 mg via ORAL
  Filled 2024-08-04 (×8): qty 2

## 2024-08-04 MED ORDER — ENSURE PLUS HIGH PROTEIN PO LIQD
237.0000 mL | Freq: Two times a day (BID) | ORAL | Status: DC
Start: 2024-08-05 — End: 2024-08-08
  Administered 2024-08-05 – 2024-08-07 (×6): 237 mL via ORAL

## 2024-08-04 MED ORDER — POLYETHYLENE GLYCOL 3350 17 G PO PACK
17.0000 g | PACK | Freq: Two times a day (BID) | ORAL | Status: DC
  Administered 2024-08-05 – 2024-08-07 (×3): 17 g via ORAL
  Filled 2024-08-04 (×4): qty 1

## 2024-08-04 NOTE — Plan of Care (Signed)
  Problem: Nutrition: Goal: Risk of aspiration will decrease Outcome: Progressing Goal: Dietary intake will improve Outcome: Progressing   Problem: Activity: Goal: Risk for activity intolerance will decrease Outcome: Progressing   Problem: Safety: Goal: Ability to remain free from injury will improve Outcome: Progressing   Problem: Safety: Goal: Non-violent Restraint(s) Outcome: Progressing   Problem: Skin Integrity: Goal: Risk for impaired skin integrity will decrease Outcome: Progressing

## 2024-08-04 NOTE — Progress Notes (Signed)
 Palliative:  HPI: 68 y.o. male  with past medical history of  schizophrenia and intellectual disability, HFrEF with EF <20% on last echo, hx of BPH and bladder outlet obstruction with indwelling foley and multiple recent admissions and ED visits for foley obstruction and UTI's, most recent discharge 9/20  admitted on 07/26/2024 with respiratory distress and pin point pupils and was intubated in the ED. Labs significant for elevated lactic acid, troponin and WBC of 20k. CXR with patchy opacities and edema.  CTH revealed hypodensity in the anterior L MCA c/f subacute infarct. PMT has been consulted to assist with goals of care conversation as Legal Guardian and patient face treatment options, complex medical decisions, and anticipatory care needs.    Chart reviewed. Reviewed medications current and home meds. I met today at Mr. Creedon's bedside. No visitors present. He sits up on side of bed impulsively as I am entering room. He tracks and interacts with me. He is verbal but confused. I ask him where he is but he does not answer. I asked if we are in a hospital and he says yes but then I ask if we are in a grocery store and he also says yes. I commented on his balloons and asked if his sister Charlies brought these and he says yes and then I ask if guardian [name] brought these and he also says yes. Tolerating diet with fluctuating intake.   I recommend restarting clozapine  as this may help with his agitation - communicated recommendation to Dr. Drusilla.   Agree with pursuing SNF rehab.   Exam: Awake, alert, confused. + tracking and follows some commands. Impulsive. Agitation. No distress. Breathing regular, unlabored. Abd soft. Warm to touch.   Plan:  - DNR/DNI - Delirium precautions - Recommend restart clozapine  - Agree SNF rehab with palliative to follow  35 min  Bernarda Kitty, NP Palliative Medicine Team Pager [phone] (Please see [email or web address] for schedule) Team Phone [phone]

## 2024-08-04 NOTE — Progress Notes (Signed)
 [facility]  PROGRESS NOTE  Jordan Kennedy FMW:[id number] DOB: 1956-06-24 DOA: 07/26/2024 PCP: [name] HERO, MD   Brief HPI:    68 y.o. M with PMH schizophrenia and intellectual disability, HFrEF with EF <20% on last echo, hx of BPH and bladder outlet obstruction with indwelling foley and multiple recent admissions and ED visits for foley obstruction and UTI's, most recent discharge 9/20 who presented with respiratory distress and pin point pupils and was intubated in the ED. Labs significant for elevated lactic acid, troponin and WBC of 20k. CXR with patchy opacities and edema and CT head pending. PCCM consulted for admission.    Assessment/Plan:    Acute respiratory failure Pulmonary edema vs multifocal PNA - RVP and urine strep neg Growing Klebsiella in sputum Encephalopathy, presumed metabolic. Now agitated at times, but very sensitive to sedatives.  -One way extubation 10/9 -Bronchial hygiene and RT/bronchodilator protocol   Subacute CVA - MRI brain with multiple non-hemorrhagic embolic infarcts in bilateral cerebral cortex, left thalamus, right cerebellar hemisphere. - Neuro has signed off - Continue heparin  considering cardiomyopathy - continue ASA, Atorvastatin  Sinus tachycardia - Patient was taking metoprolol  12.5 mg daily at home. -Started back on metoprolol  12.5 mg daily   Hematuria -Developed hematuria while being on heparin  -Discussed with cardiology, Dr. Francyne ;no benefit of continue with heparin  beyond 48 hours -Not a candidate for aggressive interventions including cardiac catheterization, defibrillator placement - Heparin  discontinued   Septic encephalopathy - Slowly improving - Swallow eval.  Start clears - TSH, ammonia wnl   Septic shock- resolved -WBC still elevated at 24,000 UTI - ESBL Klebsiella. Chronic foley.  Possible HCAP BC -1 bottle blood growing staph hemolyticus. Likely a contaminant.  - Continue meropenem - On midodrine  to 5mg   (home dose is 10)    NSTEMI HFrEF, end stage - echo 10/6 with EF <20% and GIIDD. Elevated troponins  Chronic hypotension  - Heparin  discontinued due to hematuria as above - Continue PTA midodrine  - Not a cath candidate   AKI - resolved Hypocalcemia Hypomagnesemia BPH  -Supportive care -follow renal indices, UOP   Elevated LFTs - shock state - Trend LFTs intermittently   Schizophrenia  -resume meds as appropriate  - Home clozapine  on hold but does have withdrawal potention so we should consider restarting when we can. Will need follow restart protocol with lower dose.  Clozapine  will prolong QTC but has very mild effect per pharmacy.  -Will restart clozapine  at lower dose of 50 mg p.o. twice daily due to ongoing anxiety and agitation   GOC -Appreciate PMT assistance. Care to be coordinated with legal guardian only. Code status changed to DNR. One way extubation 10/9.     Medications     aspirin   81 mg Oral Daily   atorvastatin  80 mg Oral Daily   Chlorhexidine  Gluconate Cloth  6 each Topical Daily   insulin aspart  0-9 Units Subcutaneous Q4H   metoprolol  succinate  12.5 mg Oral q AM   midodrine   5 mg Oral Q8H   multivitamin with minerals  1 tablet Oral Daily   PARoxetine   40 mg Oral Daily   polyethylene glycol  17 g Oral Daily   thiamine  100 mg Oral Daily     Data Reviewed:   CBG:  Recent Labs  Lab 08/03/24 1158 08/03/24 1608 08/03/24 1951 08/03/24 2343 08/04/24 0426  GLUCAP 101* 116* 130* 131* 102*    SpO2: 97 % O2 Flow Rate (L/min): 4 L/min FiO2 (%): 40 %  Vitals:   08/04/24 0028 08/04/24 0046 08/04/24 0106 08/04/24 0431  BP: 121/89   (!) 118/90  Pulse: (!) 121 (!) 120 (!) 121 (!) 117  Resp: (!) 24 (!) 22 20 (!) 24  Temp: 99.1 F (37.3 C)   98.2 F (36.8 C)  TempSrc: Oral   Oral  SpO2: 96% 97% 94% 97%  Weight:    77.3 kg  Height:          Data Reviewed:  Basic Metabolic Panel: Recent Labs  Lab 07/28/24 0858 07/29/24 0330  07/30/24 0445 07/31/24 0517 08/01/24 0701 08/02/24 0418  NA 147* 143 143 144 144 139  K 3.5 4.0 4.0 4.1 3.7 3.6  CL 115* 113* 113* 111 109 108  CO2 17* 19* 18* 23 21* 23  GLUCOSE 89 97 140* 96 86 110*  BUN 22 18 21 10 10 10   CREATININE 1.27* 1.34* 1.11 1.05 0.98 0.86  CALCIUM  8.3* 8.2* 8.1* 8.0* 8.1* 8.0*  MG 1.6* 2.3 2.1 1.8 1.8  --   PHOS 2.9 2.7 2.3* 2.4* 2.1*  --     CBC: Recent Labs  Lab 07/28/24 1046 07/29/24 0330 07/31/24 0517 08/01/24 0701 08/02/24 0418 08/03/24 0513 08/04/24 0412  WBC 20.4*   < > 15.0* 16.4* 19.2* 18.8* 24.3*  NEUTROABS 16.5*  --   --   --   --   --   --   HGB 10.8*   < > 11.3* 11.0* 11.0* 11.5* 13.8  HCT 33.2*   < > 37.9* 34.4* 34.2* 35.7* 43.9  MCV 93.0   < > 101.1* 93.7 91.9 91.5 93.2  PLT 226   < > 240 333 412* 446* 558*   < > = values in this interval not displayed.    LFT Recent Labs  Lab 07/31/24 0517  AST 23  ALT 62*  ALKPHOS 113  BILITOT 0.9  PROT 5.3*  ALBUMIN  2.0*     Antibiotics: Anti-infectives (From admission, onward)    Start     Dose/Rate Route Frequency Ordered Stop   07/29/24 1400  meropenem (MERREM) 1 g in sodium chloride  0.9 % 100 mL IVPB        1 g 200 mL/hr over 30 Minutes Intravenous Every 8 hours 07/29/24 1036 08/05/24 1359   07/28/24 2200  vancomycin  (VANCOREADY) IVPB 1250 mg/250 mL  Status:  Discontinued        1,250 mg 166.7 mL/hr over 90 Minutes Intravenous Every 48 hours 07/26/24 1947 07/27/24 1000   07/27/24 1400  piperacillin -tazobactam (ZOSYN ) IVPB 3.375 g  Status:  Discontinued        3.375 g 12.5 mL/hr over 240 Minutes Intravenous Every 8 hours 07/27/24 1051 07/29/24 1036   07/26/24 1945  ceFEPIme  (MAXIPIME ) 2 g in sodium chloride  0.9 % 100 mL IVPB  Status:  Discontinued        2 g 200 mL/hr over 30 Minutes Intravenous Every 12 hours 07/26/24 1930 07/27/24 1051   07/26/24 1945  vancomycin  (VANCOREADY) IVPB 1750 mg/350 mL        1,750 mg 175 mL/hr over 120 Minutes Intravenous  Once 07/26/24  1931 07/27/24 0022   07/26/24 1900  cefTRIAXone  (ROCEPHIN ) 2 g in sodium chloride  0.9 % 100 mL IVPB        2 g 200 mL/hr over 30 Minutes Intravenous Once 07/26/24 1849 07/26/24 1932        DVT prophylaxis: Heparin   Code Status: DNR  Family Communication: No family at bedside   CONSULTS neurology,  PCCM   Subjective   Patient has been agitated intermittently   Objective    Physical Examination:  Appears in no acute distress S1-S2, regular, no murmur auscultated Lungs are clear to auscultation bilaterally Abdomen is soft, nontender Alert, oriented to self only  Status is: Inpatient:             Jordan Kennedy   [facility] If 7PM-7AM, please contact night-coverage at [email or web address], Office  [phone]   08/04/2024, 7:46 AM  LOS: 9 days

## 2024-08-04 NOTE — Progress Notes (Signed)
 Pt agitated, provider notified for PRN medication for anxiety   08/04/24 0431  Vitals  Temp 98.2 F (36.8 C)  Temp Source Oral  BP (!) 118/90  MAP (mmHg) 98  BP Location Right Arm  BP Method Automatic  Patient Position (if appropriate) Lying  Pulse Rate (!) 117  Pulse Rate Source Monitor  ECG Heart Rate (!) 119  Resp (!) 24  MEWS COLOR  MEWS Score Color Yellow  Oxygen Therapy  SpO2 97 %  O2 Device Room Air  MEWS Score  MEWS Temp 0  MEWS Systolic 0  MEWS Pulse 2  MEWS RR 1  MEWS LOC 0  MEWS Score 3

## 2024-08-04 NOTE — TOC Progression Note (Signed)
 Transition of Care [facility]) - Progression Note    Patient Details  Name: Camryn Quesinberry MRN: [id number] Date of Birth: [birth year]  Transition of Care [facility]) CM/SW Contact  Bridget Cordella Simmonds, LCSW Phone Number: 08/04/2024, 3:00 PM  Clinical Narrative:   CSW message with [facility].  She confirmed pt is there as STR pt, can return at DC.  CSW informed her that pt guardian now seeking LTC after STR, [facility] said this can be discussed once pt returns.      Barriers to Discharge: Continued Medical Work up               Expected Discharge Plan and Services                                               Social Drivers of Health (SDOH) Interventions SDOH Screenings   Food Insecurity: Patient Unable To Answer (07/02/2024)  Housing: Patient Unable To Answer (07/02/2024)  Transportation Needs: Patient Unable To Answer (07/02/2024)  Utilities: Patient Unable To Answer (07/02/2024)  Alcohol Screen: Low Risk  (11/05/2023)  Depression (PHQ2-9): Low Risk  (11/05/2023)  Financial Resource Strain: Low Risk  (11/05/2023)  Physical Activity: Inactive (11/05/2023)  Social Connections: Patient Unable To Answer (07/02/2024)  Stress: No Stress Concern Present (11/05/2023)  Tobacco Use: Medium Risk (07/01/2024)  Health Literacy: Adequate Health Literacy (11/05/2023)    Readmission Risk Interventions    07/30/2024    2:39 PM 06/28/2024   11:41 AM 06/11/2024   10:27 AM  Readmission Risk Prevention Plan  Transportation Screening Complete Complete Complete  PCP or Specialist Appt within 5-7 Days  Complete   PCP or Specialist Appt within 3-5 Days   Complete  Home Care Screening  Complete   Medication Review (RN CM)  Complete   HRI or Home Care Consult   Complete  Social Work Consult for Recovery Care Planning/Counseling   Complete  Palliative Care Screening   Not Applicable  Medication Review [employer]) Complete  Complete  PCP or Specialist appointment within 3-5 days of discharge Complete     HRI or Home Care Consult Complete    SW Recovery Care/Counseling Consult Complete    Palliative Care Screening Complete    Skilled Nursing Facility Complete

## 2024-08-04 NOTE — [place] FL2 (Signed)
 [place]  MEDICAID FL2 LEVEL OF CARE FORM     IDENTIFICATION  Patient Name: Jordan Kennedy Birthdate: 05/06/1956 Sex: male Admission Date (Current Location): 07/26/2024  [place] and [place] Number:  [place] [id number] Facility and Address:  [facility], [address] [place], [place] [address]      Provider Number: [id number]  Attending Physician Name and Address:  Drusilla Sabas RAMAN, MD  Relative Name and Phone Number:  [facility] Legal Guardian [phone]    Current Level of Care: Hospital Recommended Level of Care: Skilled Nursing Facility Prior Approval Number:    Date Approved/Denied:   PASRR Number: [id number]  Discharge Plan: SNF    Current Diagnoses: Patient Active Problem List   Diagnosis Date Noted   Protein-calorie malnutrition, severe 07/29/2024   NSTEMI (non-ST elevated myocardial infarction) (HCC) 07/28/2024   Respiratory distress 07/26/2024   Shock (HCC) 07/26/2024   Stroke (cerebrum) (HCC) 07/26/2024   Hypotension due to hypovolemia 07/01/2024   Sepsis due to gram-negative urinary tract infection (HCC) 07/01/2024   Sepsis (HCC) 07/01/2024   Sepsis due to undetermined organism (HCC) 06/26/2024   Elevated troponin 06/26/2024   Hyperglycemia 06/26/2024   Glucose intolerance (impaired glucose tolerance) 06/26/2024   UTI (urinary tract infection) 06/10/2024   Acute metabolic encephalopathy 06/10/2024   Hyponatremia 06/10/2024   Hydronephrosis due to obstruction of bladder 06/02/2024   Obstructive nephropathy due to benign prostatic hyperplasia 06/02/2024   Bladder outlet obstruction 05/15/2024   Enuresis 06/19/2023   Left bundle branch block 02/13/2023   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   Tinea pedis of both feet 01/11/2023   Chronic HFrEF (heart failure with reduced ejection fraction) (HCC) 01/03/2023   Prolonged QT interval 01/01/2023   Transaminitis  01/01/2023   Thrombocytosis 01/01/2023   Congestive heart failure (HCC) 01/01/2023   Tardive dyskinesia 11/20/2021   BPH with obstruction/lower urinary tract symptoms 05/02/2021   History of small bowel obstruction 01/31/2021   History of colon polyps 01/31/2021   Onychomycosis 01/31/2021   Gastroesophageal reflux disease 01/31/2021   AKI (acute kidney injury) 06/29/2018   Tobacco use disorder, mild, in sustained remission 02/01/2015   Constipation due to slow transit 04/25/2012   Anxiety and depression 04/25/2012   Edentulous 04/25/2012   Schizophrenia (HCC)    Intellectual disability     Orientation RESPIRATION BLADDER Height & Weight     Self, Place  Normal Incontinent, Indwelling catheter Weight: 170 lb 6.7 oz (77.3 kg) Height:  6' 5 (195.6 cm)  BEHAVIORAL SYMPTOMS/MOOD NEUROLOGICAL BOWEL NUTRITION STATUS      Incontinent Diet (see discharge summary)  AMBULATORY STATUS COMMUNICATION OF NEEDS Skin   Extensive Assist Verbally Normal                       Personal Care Assistance Level of Assistance  Total care Bathing Assistance: Maximum assistance Feeding assistance: Limited assistance Dressing Assistance: Maximum assistance Total Care Assistance: Maximum assistance   Functional Limitations Info  Sight, Hearing, Speech Sight Info: Adequate Hearing Info: Adequate Speech Info: Adequate    SPECIAL CARE FACTORS FREQUENCY  PT (By licensed PT), OT (By licensed OT)     PT Frequency: 5x week OT Frequency: 5x week            Contractures Contractures Info: Not present    Additional Factors Info  Code Status, Allergies, Insulin Sliding Scale Code Status Info: full Allergies Info: NKA   Insulin Sliding  Scale Info: Novolog: see discharge summary       Current Medications (08/04/2024):  This is the current hospital active medication list Current Facility-Administered Medications  Medication Dose Route Frequency Provider Last Rate Last Admin    acetaminophen  (TYLENOL ) tablet 650 mg  650 mg Oral Q6H PRN Lama, Gagan S, MD   650 mg at 08/03/24 1602   aspirin  chewable tablet 81 mg  81 mg Oral Daily Mannam, Praveen, MD   81 mg at 08/04/24 0909   atorvastatin (LIPITOR) tablet 80 mg  80 mg Oral Daily Mannam, Praveen, MD   80 mg at 08/04/24 0908   Chlorhexidine  Gluconate Cloth 2 % PADS 6 each  6 each Topical Daily Gleason, Laura R, PA-C   6 each at 08/04/24 9164   cloZAPine  (CLOZARIL ) tablet 50 mg  50 mg Oral BID Lama, Gagan S, MD   50 mg at 08/04/24 1416   docusate sodium  (COLACE) capsule 100 mg  100 mg Oral BID PRN Jimenez, Aileen, RPH       insulin aspart (novoLOG) injection 0-9 Units  0-9 Units Subcutaneous Q4H Gleason, Leita SAUNDERS, PA-C   1 Units at 08/04/24 0913   meropenem (MERREM) 1 g in sodium chloride  0.9 % 100 mL IVPB  1 g Intravenous Q8H Rosan Deward ORN, NP 200 mL/hr at 08/04/24 1415 1 g at 08/04/24 1415   metoprolol  succinate (TOPROL -XL) 24 hr tablet 12.5 mg  12.5 mg Oral q AM Lama, Gagan S, MD   12.5 mg at 08/04/24 9091   midodrine  (PROAMATINE ) tablet 5 mg  5 mg Oral Q8H Mannam, Praveen, MD   5 mg at 08/04/24 1415   multivitamin with minerals tablet 1 tablet  1 tablet Oral Daily Mannam, Praveen, MD   1 tablet at 08/04/24 0908   Oral care mouth rinse  15 mL Mouth Rinse PRN Mannam, Praveen, MD       PARoxetine  (PAXIL ) tablet 40 mg  40 mg Oral Daily Mannam, Praveen, MD   40 mg at 08/04/24 0909   polyethylene glycol (MIRALAX  / GLYCOLAX ) packet 17 g  17 g Oral Daily PRN Jimenez, Aileen, RPH       polyethylene glycol (MIRALAX  / GLYCOLAX ) packet 17 g  17 g Oral Daily Jimenez, Aileen, RPH       thiamine (VITAMIN B1) tablet 100 mg  100 mg Oral Daily Jimenez, Aileen, RPH   100 mg at 08/04/24 0908     Discharge Medications: Please see discharge summary for a list of discharge medications.  Relevant Imaging Results:  Relevant Lab Results:   Additional Information SSN: [id number]  Bridget Cordella Simmonds, LCSW

## 2024-08-04 NOTE — Progress Notes (Signed)
 Physical Therapy Treatment Patient Details Name: Jordan Kennedy MRN: [id number] DOB: October 14, 1956 Today's Date: 08/04/2024   History of Present Illness Pt is a 68 y.o. with admission in August for septic shock and UTI and discharged 8/27 with an indwelling catheter. Admitted 9/5 for constipation and possible colitis and discharged  home on 9/7. Admitted 07/26/24 with respiratory distress and pin point pupils and was intubated in the ED. 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    PT Comments  Pt eager to participate, asking to get up and ambulate upon our arrival. The pt continues to require modA to complete bed mobility and maxA of 2 to complete sit-stand due to poor awareness and strength in RUE, as well as poor strength, power, and coordination in LE as well as generally impaired dynamic balance. Pt attempted to initiate standing marches and lateral steps, but needed assist to facilitate wt shift as well as to advance L foot for lateral stepping. Pt fatigued after x3 standing trials, further gait deferred at this time. Recommendations remain appropriate, will continue efforts acutely.    If plan is discharge home, recommend the following: Two people to help with walking and/or transfers;Two people to help with bathing/dressing/bathroom;Assistance with cooking/housework;Assistance with feeding;Direct supervision/assist for medications management;Direct supervision/assist for financial management;Assist for transportation;Help with stairs or ramp for entrance;Supervision due to cognitive status   Can travel by private vehicle     No  Equipment Recommendations  Wheelchair (measurements PT);Wheelchair cushion (measurements PT)    Recommendations for Other Services       Precautions / Restrictions  Precautions Precautions: Fall Recall of Precautions/Restrictions: Impaired Precaution/Restrictions Comments: foley, watch hypotension Restrictions Weight Bearing Restrictions Per Provider Order: No     Mobility  Bed Mobility Overal bed mobility: Needs Assistance Bed Mobility: Supine to Sit, Sit to Supine     Supine to sit: HOB elevated, Used rails, Min assist, Mod assist Sit to supine: Min assist   General bed mobility comments: minA to manage LE to EOB and modA to elevate trunk. pt needing increased time and modA to scoot hips to EOB. minA to return LE to bed    Transfers Overall transfer level: Needs assistance Equipment used: 2 person hand held assist Transfers: Sit to/from Stand Sit to Stand: Mod assist, +2 physical assistance           General transfer comment: modA to power up with buckling of RLE >LLE, modA to maintain standing balance, less posterior lean this morning. limited ability to wt shift or step witheither LE    Ambulation/Gait Ambulation/Gait assistance: Max assist, +2 physical assistance, +2 safety/equipment Gait Distance (Feet): 1 Feet Assistive device: 2 person hand held assist Gait Pattern/deviations: Step-to pattern, Shuffle, Decreased stride length, Narrow base of support, Ataxic, Knees buckling       General Gait Details: pt with buckling in BLE (RLE >LLE) and unable to advance LLE laterally along EOB. assist to wt shift to facilitate small steps   Stairs             Wheelchair Mobility     Tilt Bed    Modified Rankin (Stroke Patients Only) Modified Rankin (Stroke Patients Only) Pre-Morbid Rankin Score: Moderately severe disability Modified Rankin: Severe disability     Balance Overall balance assessment: Needs assistance Sitting-balance support: Feet supported Sitting balance-Leahy Scale: Poor Sitting balance -  Comments: needing minA to modA to manage static sitting, modA to manage reaching outside BOS Postural control:  Posterior lean Standing balance support: Single extremity supported, During functional activity Standing balance-Leahy Scale: Poor Standing balance comment: pt with strong posterior lean needing modA to maintain upright                            Communication Communication Communication: Impaired Factors Affecting Communication: Reduced clarity of speech  Cognition Arousal: Alert Behavior During Therapy: Impulsive   PT - Cognitive impairments: History of cognitive impairments                       PT - Cognition Comments: decreased awareness to R side and limited commands following <50%, increased time for all command following Following commands: Impaired Following commands impaired: Follows one step commands inconsistently, Follows one step commands with increased time    Cueing Cueing Techniques: Verbal cues, Gestural cues  Exercises      General Comments General comments (skin integrity, edema, etc.): VSS on RA      Pertinent Vitals/Pain Pain Assessment Pain Assessment: No/denies pain Faces Pain Scale: No hurt     PT Goals (current goals can now be found in the care plan section) Acute Rehab PT Goals Patient Stated Goal: to go home PT Goal Formulation: With patient Time For Goal Achievement: 08/16/24 Potential to Achieve Goals: Fair Progress towards PT goals: Progressing toward goals    Frequency    Min 2X/week       AM-PAC PT 6 Clicks Mobility   Outcome Measure  Help needed turning from your back to your side while in a flat bed without using bedrails?: A Lot Help needed moving from lying on your back to sitting on the side of a flat bed without using bedrails?: A Lot Help needed moving to and from a bed to a chair (including a wheelchair)?: Total Help needed standing up from a chair using your arms (e.g., wheelchair or bedside chair)?: A Lot Help needed to walk in hospital room?: Total Help needed climbing 3-5 steps with a  railing? 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

## 2024-08-04 NOTE — Progress Notes (Signed)
 Admission Assess  Patient unable to communicate due to illness.  Twin sister Charlies at bedside.  Ms Charlies states that sister Denver is Marine scientist.  Unable to reach sister via telephone.  Ms Charlies able to answer basic admission questions.    Admissions incomplete.  Primary RN updated.

## 2024-08-04 NOTE — Progress Notes (Signed)
 Pt with increasing agitation, kind of rolling back and forth in bed and making grunting noises. Pt denies pain or other discomfort. Provider on call notified for IV ativan  which worked well earlier in shift.

## 2024-08-04 NOTE — Progress Notes (Signed)
 Per report pt has been experiencing increasing agitation throughout the afternoon. Pt is currently pulling at telemetry wires and foley, making grunting noises. Pt denies complaints and vital signs stable(see chart) Sister at bedside requesting something to help him relax. Provider on call notified with orders received.

## 2024-08-05 DIAGNOSIS — R0603 Acute respiratory distress: Secondary | ICD-10-CM | POA: Diagnosis not present

## 2024-08-05 LAB — COMPREHENSIVE METABOLIC PANEL WITH GFR
ALT: 36 U/L (ref 0–44)
AST: 29 U/L (ref 15–41)
Albumin: 2.3 g/dL — ABNORMAL LOW (ref 3.5–5.0)
Alkaline Phosphatase: 100 U/L (ref 38–126)
Anion gap: 10 (ref 5–15)
BUN: 24 mg/dL — ABNORMAL HIGH (ref 8–23)
CO2: 21 mmol/L — ABNORMAL LOW (ref 22–32)
Calcium: 8.5 mg/dL — ABNORMAL LOW (ref 8.9–10.3)
Chloride: 111 mmol/L (ref 98–111)
Creatinine, Ser: 1.27 mg/dL — ABNORMAL HIGH (ref 0.61–1.24)
GFR, Estimated: >60 mL/min (ref >60)
Glucose, Bld: 123 mg/dL — ABNORMAL HIGH (ref 70–99)
Potassium: 4.4 mmol/L (ref 3.5–5.1)
Sodium: 142 mmol/L (ref 135–145)
Total Bilirubin: 0.9 mg/dL (ref 0.0–1.2)
Total Protein: 5.9 g/dL — ABNORMAL LOW (ref 6.5–8.1)

## 2024-08-05 LAB — CBC
HCT: 31.6 % — ABNORMAL LOW (ref 39.0–52.0)
Hemoglobin: 10.2 g/dL — ABNORMAL LOW (ref 13.0–17.0)
MCH: 30 pg (ref 26.0–34.0)
MCHC: 32.3 g/dL (ref 30.0–36.0)
MCV: 92.9 fL (ref 80.0–100.0)
Platelets: 465 K/uL — ABNORMAL HIGH (ref 150–400)
RBC: 3.4 MIL/uL — ABNORMAL LOW (ref 4.22–5.81)
RDW: 19.9 % — ABNORMAL HIGH (ref 11.5–15.5)
WBC: 17.8 K/uL — ABNORMAL HIGH (ref 4.0–10.5)
nRBC: 0 % (ref 0.0–0.2)

## 2024-08-05 LAB — GLUCOSE, CAPILLARY
Glucose-Capillary: 117 mg/dL — ABNORMAL HIGH (ref 70–99)
Glucose-Capillary: 118 mg/dL — ABNORMAL HIGH (ref 70–99)
Glucose-Capillary: 119 mg/dL — ABNORMAL HIGH (ref 70–99)
Glucose-Capillary: 122 mg/dL — ABNORMAL HIGH (ref 70–99)
Glucose-Capillary: 131 mg/dL — ABNORMAL HIGH (ref 70–99)
Glucose-Capillary: 145 mg/dL — ABNORMAL HIGH (ref 70–99)
Glucose-Capillary: 175 mg/dL — ABNORMAL HIGH (ref 70–99)

## 2024-08-05 NOTE — TOC Progression Note (Signed)
 Transition of Care [facility]) - Progression Note    Patient Details  Name: Jordan Kennedy MRN: [id number] Date of Birth: 1955/12/12  Transition of [facility]) CM/SW Contact  Inocente GORMAN Kindle, LCSW Phone Number: 08/05/2024, 2:46 PM  Clinical Narrative:    CSW provided update to [employer].      Barriers to Discharge: Continued Medical Work up               Expected Discharge Plan and Services                                               Social Drivers of Health (SDOH) Interventions SDOH Screenings   Food Insecurity: Patient Unable To Answer (07/02/2024)  Housing: Patient Unable To Answer (07/02/2024)  Transportation Needs: Patient Unable To Answer (07/02/2024)  Utilities: Patient Unable To Answer (07/02/2024)  Alcohol Screen: Low Risk  (11/05/2023)  Depression (PHQ2-9): Low Risk  (11/05/2023)  Financial Resource Strain: Low Risk  (11/05/2023)  Physical Activity: Inactive (11/05/2023)  Social Connections: Patient Unable To Answer (07/02/2024)  Stress: No Stress Concern Present (11/05/2023)  Tobacco Use: Medium Risk (07/01/2024)  Health Literacy: Adequate Health Literacy (11/05/2023)    Readmission Risk Interventions    07/30/2024    2:39 PM 06/28/2024   11:41 AM 06/11/2024   10:27 AM  Readmission Risk Prevention Plan  Transportation Screening Complete Complete Complete  PCP or Specialist Appt within 5-7 Days  Complete   PCP or Specialist Appt within 3-5 Days   Complete  Home Care Screening  Complete   Medication Review (RN CM)  Complete   HRI or Home Care Consult   Complete  Social Work Consult for Recovery Care Planning/Counseling   Complete  Palliative Care Screening   Not Applicable  Medication Review Oceanographer) Complete  Complete  PCP or Specialist appointment within 3-5 days of discharge Complete    HRI or Home Care Consult Complete    SW Recovery Care/Counseling Consult Complete    Palliative Care Screening Complete    Skilled Nursing  Facility Complete

## 2024-08-05 NOTE — Progress Notes (Signed)
 This RN noticed patient bladder is distended despite having Foley Cath. Bladder Scanned the Patient and it showed >470. Noticed some leaking in Foley Cath site too.  Press photographer Aware. Notified Provider.

## 2024-08-05 NOTE — Hospital Course (Signed)
 68 y.o. M with PMH schizophrenia and intellectual disability, HFrEF with EF <20% on last echo, hx of BPH and bladder outlet obstruction with indwelling foley and multiple recent admissions and ED visits for foley obstruction and UTI's, most recent discharge 9/20 who presented with respiratory distress and pin point pupils and was intubated in the ED. Labs significant for elevated lactic acid, troponin and WBC of 20k. CXR with patchy opacities and edema and CT head pending. PCCM consulted for admission.

## 2024-08-05 NOTE — Plan of Care (Signed)
  Problem: Education: Goal: Ability to describe self-care measures that may prevent or decrease complications (Diabetes Survival Skills Education) will improve Outcome: Progressing Goal: Individualized Educational Video(s) Outcome: Progressing   Problem: Coping: Goal: Ability to adjust to condition or change in health will improve Outcome: Progressing   Problem: Fluid Volume: Goal: Ability to maintain a balanced intake and output will improve Outcome: Progressing   Problem: Health Behavior/Discharge Planning: Goal: Ability to identify and utilize available resources and services will improve Outcome: Progressing Goal: Ability to manage health-related needs will improve Outcome: Progressing   Problem: Metabolic: Goal: Ability to maintain appropriate glucose levels will improve Outcome: Progressing   Problem: Nutritional: Goal: Maintenance of adequate nutrition will improve Outcome: Progressing Goal: Progress toward achieving an optimal weight will improve Outcome: Progressing   Problem: Skin Integrity: Goal: Risk for impaired skin integrity will decrease Outcome: Progressing   Problem: Tissue Perfusion: Goal: Adequacy of tissue perfusion will improve Outcome: Progressing   Problem: Ischemic Stroke/TIA Tissue Perfusion: Goal: Complications of ischemic stroke/TIA will be minimized Outcome: Progressing   Problem: Coping: Goal: Will verbalize positive feelings about self Outcome: Progressing Goal: Will identify appropriate support needs Outcome: Progressing   Problem: Health Behavior/Discharge Planning: Goal: Ability to manage health-related needs will improve Outcome: Progressing Goal: Goals will be collaboratively established with patient/family Outcome: Progressing   Problem: Self-Care: Goal: Ability to participate in self-care as condition permits will improve Outcome: Progressing Goal: Verbalization of feelings and concerns over difficulty with self-care will  improve Outcome: Progressing Goal: Ability to communicate needs accurately will improve Outcome: Progressing   Problem: Nutrition: Goal: Risk of aspiration will decrease Outcome: Progressing Goal: Dietary intake will improve Outcome: Progressing   Problem: Education: Goal: Knowledge of General Education information will improve Description: Including pain rating scale, medication(s)/side effects and non-pharmacologic comfort measures Outcome: Progressing   Problem: Health Behavior/Discharge Planning: Goal: Ability to manage health-related needs will improve Outcome: Progressing   Problem: Clinical Measurements: Goal: Ability to maintain clinical measurements within normal limits will improve Outcome: Progressing Goal: Will remain free from infection Outcome: Progressing Goal: Diagnostic test results will improve Outcome: Progressing Goal: Respiratory complications will improve Outcome: Progressing Goal: Cardiovascular complication will be avoided Outcome: Progressing   Problem: Activity: Goal: Risk for activity intolerance will decrease Outcome: Progressing   Problem: Nutrition: Goal: Adequate nutrition will be maintained Outcome: Progressing   Problem: Coping: Goal: Level of anxiety will decrease Outcome: Progressing   Problem: Elimination: Goal: Will not experience complications related to bowel motility Outcome: Progressing Goal: Will not experience complications related to urinary retention Outcome: Progressing   Problem: Pain Managment: Goal: General experience of comfort will improve and/or be controlled Outcome: Progressing   Problem: Safety: Goal: Ability to remain free from injury will improve Outcome: Progressing   Problem: Skin Integrity: Goal: Risk for impaired skin integrity will decrease Outcome: Progressing   Problem: Safety: Goal: Non-violent Restraint(s) Outcome: Progressing

## 2024-08-05 NOTE — Progress Notes (Signed)
 Speech Language Pathology Treatment: Dysphagia  Patient Details Name: Jordan Kennedy MRN: [id number] DOB: 06-29-1956 Today's Date: 08/05/2024 Time: 8499-8487 SLP Time Calculation (min) (ACUTE ONLY): 12 min  Assessment / Plan / Recommendation Clinical Impression  Skilled therapy session targeted dysphagia goals. SLP facilitated session by observing patient with POs of thin liquids and regular solids. Patient with mildly prolonged mastication times given endentulous state, however complete oral clearance with use of liquid wash. No s/sx of aspiration present. Continue current diet. Patient may benefit from cognitive-linguistic evaluation per patients GOC  HPI HPI: Pt is a 68 y.o. M who presented with respiratory distress and pin point pupils and was intubated in the ED. ETT: 10/5-10/9. Pt passed swallow screen on 10/11 and full liquid diet initiated. SLP consulted by Palliative NP for possible diet advancement with consideration of fluctuating mentation. 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 PMH: schizophrenia and intellectual disability, HFrEF with EF <20% on last echo, hx of BPH and bladder outlet obstruction with indwelling foley and multiple recent admissions and ED visits for foley obstruction and UTIs, most recent discharge 9/20      SLP Plan  Continue with current plan of care       Recommendations  Diet recommendations: Dysphagia 3 (mechanical soft);Thin liquid Liquids provided via: Straw Medication Administration: Whole meds with puree Supervision: Full supervision/cueing for compensatory strategies Compensations: Slow rate;Small sips/bites Postural Changes and/or Swallow Maneuvers: Seated upright 90 degrees            Frequent or constant Supervision/Assistance Dysphagia, unspecified (R13.10)     Continue with current plan of care    Jahnaya Branscome M.A.,  CCC-SLP 08/05/2024, 3:17 PM

## 2024-08-05 NOTE — Progress Notes (Signed)
 Progress Note   Patient: Jordan Kennedy FMW:[id number] DOB: 13-May-1956 DOA: 07/26/2024     10 DOS: the patient was seen and examined on 08/05/2024   Brief hospital course: 68 y.o. M with PMH schizophrenia and intellectual disability, HFrEF with EF <20% on last echo, hx of BPH and bladder outlet obstruction with indwelling foley and multiple recent admissions and ED visits for foley obstruction and UTI's, most recent discharge 9/20 who presented with respiratory distress and pin point pupils and was intubated in the ED. Labs significant for elevated lactic acid, troponin and WBC of 20k. CXR with patchy opacities and edema and CT head pending. PCCM consulted for admission.   Assessment and Plan:  Acute respiratory failure Pulmonary edema vs multifocal PNA - RVP and urine strep neg Growing Klebsiella in sputum Encephalopathy, presumed metabolic. Now agitated at times, but very sensitive to sedatives.  -One way extubation 10/9 -Bronchial hygiene and RT/bronchodilator protocol   Subacute CVA - MRI brain with multiple non-hemorrhagic embolic infarcts in bilateral cerebral cortex, left thalamus, right cerebellar hemisphere. - Neuro has signed off - Continue heparin  considering cardiomyopathy - continue ASA, Atorvastatin   Sinus tachycardia - Patient was taking metoprolol  12.5 mg daily at home. -Started back on metoprolol  12.5 mg daily     Hematuria -Developed hematuria while being on heparin  -Discussed with cardiology, Dr. Francyne ;no benefit of continue with heparin  beyond 48 hours -Not a candidate for aggressive interventions including cardiac catheterization, defibrillator placement - Heparin  discontinued   Septic encephalopathy - Slowly improving - Swallow eval.  Start clears - TSH, ammonia wnl   UTI/septic shock- resolved -WBC still elevated at 24,000 UTI - ESBL Klebsiella. Chronic foley.  Possible HCAP BC -1 bottle blood growing staph hemolyticus. Likely a contaminant.  -  Continue meropenem - On midodrine  to 5mg  (home dose is 10)    NSTEMI HFrEF, end stage - echo 10/6 with EF <20% and GIIDD. Elevated troponins  Chronic hypotension  - Heparin  discontinued due to hematuria as above - Continue PTA midodrine  - Not a cath candidate   AKI - resolved Hypocalcemia Hypomagnesemia BPH  -Supportive care -follow renal indices, UOP   Elevated LFTs - shock state - Trend LFTs intermittently   Schizophrenia  -resume meds as appropriate  - Home clozapine  on hold but does have withdrawal potention so we should consider restarting when we can. Will need follow restart protocol with lower dose.  Clozapine  will prolong QTC but has very mild effect per pharmacy.  -Will restart clozapine  at lower dose of 50 mg p.o. twice daily due to ongoing anxiety and agitation   GOC -Appreciate PMT assistance. Care to be coordinated with legal guardian only. Code status changed to DNR. One way extubation 10/9.      Subjective: Denies any fever or chills  Physical Exam: Vitals:   08/05/24 0500 08/05/24 0806 08/05/24 1154 08/05/24 1253  BP:  (!) 112/93 94/61 94/78   Pulse:  (!) 103 (!) 102   Resp:  20  18  Temp:  98.3 F (36.8 C) 97.9 F (36.6 C)   TempSrc:  Oral Oral   SpO2:      Weight: 76.4 kg     Height:       Constitutional: Alert, awake, calm, comfortable HEENT: Neck supple Respiratory: clear to auscultation bilaterally, no wheezing, no crackles. Normal respiratory effort. No accessory muscle use.  Cardiovascular: Regular rate and rhythm, no murmurs / rubs / gallops. No extremity edema. 2+ pedal pulses. No carotid bruits.  Abdomen: no  tenderness, no masses palpated. No hepatosplenomegaly. Bowel sounds positive.  Musculoskeletal: no clubbing / cyanosis. No joint deformity upper and lower extremities. Good ROM, no contractures. Normal muscle tone.  Skin: no rashes, lesions, ulcers. No induration Neurologic: CN 2-12 grossly intact. Sensation intact, DTR normal.  Strength 5/5 x all 4 extremities.  Psychiatric: Normal judgment and insight. Alert and oriented x 3. Normal mood.   Data Reviewed:  Reviewed  Family Communication: None  Disposition: Status is: Inpatient Remains inpatient appropriate because: Ongoing clinical improvement  Planned Discharge Destination: Skilled nursing facility    Time spent: 35 minutes  Author: Nena Rebel, MD 08/05/2024 4:16 PM  For on call review [email or web address].

## 2024-08-05 NOTE — Plan of Care (Signed)
 Palliative:  Plans for transition to SNF rehab with anticipated transition to long term care. Palliative continues to follow peripherally - no urgent needs or decisions to be made currently.   No charge  Bernarda Kitty, NP Palliative Medicine Team Pager [phone] (Please see [email or web address] for schedule) Team Phone [phone]

## 2024-08-06 ENCOUNTER — Encounter (HOSPITAL_COMMUNITY): Payer: Self-pay | Admitting: Internal Medicine

## 2024-08-06 ENCOUNTER — Other Ambulatory Visit: Payer: Self-pay

## 2024-08-06 DIAGNOSIS — R0603 Acute respiratory distress: Secondary | ICD-10-CM | POA: Diagnosis not present

## 2024-08-06 LAB — CBC
HCT: 31.8 % — ABNORMAL LOW (ref 39.0–52.0)
Hemoglobin: 10.1 g/dL — ABNORMAL LOW (ref 13.0–17.0)
MCH: 29.8 pg (ref 26.0–34.0)
MCHC: 31.8 g/dL (ref 30.0–36.0)
MCV: 93.8 fL (ref 80.0–100.0)
Platelets: 407 K/uL — ABNORMAL HIGH (ref 150–400)
RBC: 3.39 MIL/uL — ABNORMAL LOW (ref 4.22–5.81)
RDW: 19.9 % — ABNORMAL HIGH (ref 11.5–15.5)
WBC: 16.3 K/uL — ABNORMAL HIGH (ref 4.0–10.5)
nRBC: 0 % (ref 0.0–0.2)

## 2024-08-06 LAB — MAGNESIUM: Magnesium: 2.1 mg/dL (ref 1.7–2.4)

## 2024-08-06 LAB — GLUCOSE, CAPILLARY
Glucose-Capillary: 101 mg/dL — ABNORMAL HIGH (ref 70–99)
Glucose-Capillary: 111 mg/dL — ABNORMAL HIGH (ref 70–99)
Glucose-Capillary: 114 mg/dL — ABNORMAL HIGH (ref 70–99)
Glucose-Capillary: 117 mg/dL — ABNORMAL HIGH (ref 70–99)
Glucose-Capillary: 136 mg/dL — ABNORMAL HIGH (ref 70–99)
Glucose-Capillary: 97 mg/dL (ref 70–99)

## 2024-08-06 MED ORDER — HEPARIN SODIUM (PORCINE) 5000 UNIT/ML IJ SOLN
5000.0000 [IU] | Freq: Three times a day (TID) | INTRAMUSCULAR | Status: DC
  Administered 2024-08-06 – 2024-08-07 (×5): 5000 [IU] via SUBCUTANEOUS
  Filled 2024-08-06 (×5): qty 1

## 2024-08-06 NOTE — Progress Notes (Signed)
 Progress Note   Patient: Jordan Kennedy FMW:[id number] DOB: 1956-06-20 DOA: 07/26/2024     11 DOS: the patient was seen and examined on 08/06/2024   Brief hospital course: 68 y.o. M with PMH schizophrenia and intellectual disability, HFrEF with EF <20% on last echo, hx of BPH and bladder outlet obstruction with indwelling foley and multiple recent admissions and ED visits for foley obstruction and UTI's, most recent discharge 9/20 who presented with respiratory distress and pin point pupils and was intubated in the ED. Labs significant for elevated lactic acid, troponin and WBC of 20k. CXR with patchy opacities and edema and CT head pending. PCCM consulted for admission.   Assessment and Plan:  Acute respiratory failure Pulmonary edema vs multifocal PNA - RVP and urine strep neg Growing Klebsiella in sputum Encephalopathy, presumed metabolic. Now agitated at times, but very sensitive to sedatives.  -One way extubation 10/9 -Bronchial hygiene and RT/bronchodilator protocol   Subacute CVA - MRI brain with multiple non-hemorrhagic embolic infarcts in bilateral cerebral cortex, left thalamus, right cerebellar hemisphere. - Neuro has signed off - Continue heparin  considering cardiomyopathy - continue ASA, Atorvastatin   Sinus tachycardia - Patient was taking metoprolol  12.5 mg daily at home. -Started back on metoprolol  12.5 mg daily     Hematuria -Developed hematuria while being on heparin  -Discussed with cardiology, Dr. Francyne ;no benefit of continue with heparin  beyond 48 hours -Not a candidate for aggressive interventions including cardiac catheterization, defibrillator placement - Heparin  discontinued   Septic encephalopathy - Slowly improving - Swallow eval.  Start clears - TSH, ammonia wnl   UTI/septic shock- resolved -WBC still elevated at 24,000 UTI - ESBL Klebsiella. Chronic foley.  Possible HCAP BC -1 bottle blood growing staph hemolyticus. Likely a contaminant.  -  Continue meropenem  Hypotension On midodrine  to 5mg  (home dose is 10)    NSTEMI HFrEF, end stage - echo 10/6 with EF <20% and GIIDD. Elevated troponins  Chronic hypotension  - Heparin  discontinued due to hematuria as above - Continue PTA midodrine  - Not a cath candidate   AKI - resolved Hypocalcemia Hypomagnesemia BPH  -Supportive care -follow renal indices, UOP   Elevated LFTs - shock state - Trend LFTs intermittently   Schizophrenia  -resume meds as appropriate  - Home clozapine  on hold but does have withdrawal potention so we should consider restarting when we can. Will need follow restart protocol with lower dose.  Clozapine  will prolong QTC but has very mild effect per pharmacy.  -Will restart clozapine  at lower dose of 50 mg p.o. twice daily due to ongoing anxiety and agitation   GOC -Appreciate PMT assistance. Care to be coordinated with legal guardian only. Code status changed to DNR. One way extubation 10/9.      Subjective: None  Physical Exam: Vitals:   08/06/24 0408 08/06/24 0809 08/06/24 1203 08/06/24 1523  BP: 105/87 122/85 (!) 106/94 94/73  Pulse: 93 89 97 90  Resp: 18 (!) 0 18 18  Temp: 97.9 F (36.6 C) 98.3 F (36.8 C) 98.2 F (36.8 C) 97.8 F (36.6 C)  TempSrc: Oral Axillary Oral Oral  SpO2: 100%  100% 98%  Weight: 76.3 kg     Height:       Constitutional: Alert, awake, calm, comfortable HEENT: Neck supple Respiratory: clear to auscultation bilaterally, no wheezing, no crackles. Normal respiratory effort. No accessory muscle use.  Cardiovascular: Regular rate and rhythm, no murmurs / rubs / gallops. No extremity edema. 2+ pedal pulses. No carotid bruits.  Abdomen: no tenderness, no masses palpated. No hepatosplenomegaly. Bowel sounds positive.  Musculoskeletal: no clubbing / cyanosis. No joint deformity upper and lower extremities. Good ROM, no contractures. Normal muscle tone.  Skin: no rashes, lesions, ulcers. No induration Neurologic: CN  2-12 grossly intact. Sensation intact, DTR normal. Strength 5/5 x all 4 extremities.  Psychiatric: Normal judgment and insight. Alert and oriented x 3. Normal mood.   Data Reviewed:  There are no new results to review at this time.  Family Communication: None  Disposition: Status is: Inpatient Remains inpatient appropriate because: Ongoing recovery from UTI sepsis Klebsiella  Planned Discharge Destination: Skilled nursing facility    Time spent: 35 minutes  Author: Nena Rebel, MD 08/06/2024 4:00 PM  For on call review [email or web address].

## 2024-08-06 NOTE — TOC Progression Note (Signed)
 Transition of Care [facility]) - Progression Note    Patient Details  Name: Jordan Kennedy MRN: [id number] Date of Birth: 06-Sep-1956  Transition of Care [facility]) CM/SW Contact  Isaiah Public, LCSWA Phone Number: 08/06/2024, 12:33 PM  Clinical Narrative:     Plan for patient to return to [facility] when medically ready. CSW will continue to follow.    Barriers to Discharge: Continued Medical Work up               Expected Discharge Plan and Services                                               Social Drivers of Health (SDOH) Interventions SDOH Screenings   Food Insecurity: Patient Unable To Answer (07/02/2024)  Housing: Patient Unable To Answer (07/02/2024)  Transportation Needs: Patient Unable To Answer (07/02/2024)  Utilities: Patient Unable To Answer (07/02/2024)  Alcohol Screen: Low Risk  (11/05/2023)  Depression (PHQ2-9): Low Risk  (11/05/2023)  Financial Resource Strain: Low Risk  (11/05/2023)  Physical Activity: Inactive (11/05/2023)  Social Connections: Patient Unable To Answer (07/02/2024)  Stress: No Stress Concern Present (11/05/2023)  Tobacco Use: Medium Risk (07/01/2024)  Health Literacy: Adequate Health Literacy (11/05/2023)    Readmission Risk Interventions    07/30/2024    2:39 PM 06/28/2024   11:41 AM 06/11/2024   10:27 AM  Readmission Risk Prevention Plan  Transportation Screening Complete Complete Complete  PCP or Specialist Appt within 5-7 Days  Complete   PCP or Specialist Appt within 3-5 Days   Complete  Home Care Screening  Complete   Medication Review (RN CM)  Complete   HRI or Home Care Consult   Complete  Social Work Consult for Recovery Care Planning/Counseling   Complete  Palliative Care Screening   Not Applicable  Medication Review Oceanographer) Complete  Complete  PCP or Specialist appointment within 3-5 days of discharge Complete    HRI or Home Care Consult Complete    SW Recovery Care/Counseling Consult Complete    Palliative Care  Screening Complete    Skilled Nursing Facility Complete

## 2024-08-06 NOTE — Progress Notes (Signed)
 Mobility Specialist Progress Note;   08/06/24 1450  Mobility  Activity Dangled on edge of bed  Level of Assistance Minimal assist, patient does 75% or more  Assistive Device None  Activity Response Tolerated fair  Mobility Referral Yes  Mobility visit 1 Mobility  Mobility Specialist Start Time (ACUTE ONLY) 1450  Mobility Specialist Stop Time (ACUTE ONLY) 1456  Mobility Specialist Time Calculation (min) (ACUTE ONLY) 6 min   Pts bed alarm going off, pt attempting to get out of bed. Required re-direction and assisted pt back to bed w/ MinA. Pt left comfortably in bed with all needs met. Alarm on.   Lauraine Erm Mobility Specialist Please contact via SecureChat or [employer] [phone]

## 2024-08-06 NOTE — Progress Notes (Addendum)
 Unale to complete some of the admission due to patient having a guardian  and some confusion

## 2024-08-06 NOTE — Progress Notes (Signed)
 Patient is a ward of the [facility] of Social Services.  Assigned social worker is [facility], [phone].  After-hours contact number is [phone].  Patient is a ward of [place] DSS. Case worker is: Denver Mura ([employer]) Office: [phone], Cell: [phone]    [facility] [place] a department of [facility] can leave a detailed message on [phone] and release information to [facility] & [name] MONROE.

## 2024-08-07 ENCOUNTER — Encounter (HOSPITAL_COMMUNITY): Payer: Self-pay | Admitting: Internal Medicine

## 2024-08-07 ENCOUNTER — Encounter: Payer: Self-pay | Admitting: Family Medicine

## 2024-08-07 DIAGNOSIS — R0603 Acute respiratory distress: Secondary | ICD-10-CM | POA: Diagnosis not present

## 2024-08-07 LAB — CBC
HCT: 33.5 % — ABNORMAL LOW (ref 39.0–52.0)
Hemoglobin: 10.8 g/dL — ABNORMAL LOW (ref 13.0–17.0)
MCH: 29.9 pg (ref 26.0–34.0)
MCHC: 32.2 g/dL (ref 30.0–36.0)
MCV: 92.8 fL (ref 80.0–100.0)
Platelets: 491 K/uL — ABNORMAL HIGH (ref 150–400)
RBC: 3.61 MIL/uL — ABNORMAL LOW (ref 4.22–5.81)
RDW: 19.7 % — ABNORMAL HIGH (ref 11.5–15.5)
WBC: 14.2 K/uL — ABNORMAL HIGH (ref 4.0–10.5)
nRBC: 0 % (ref 0.0–0.2)

## 2024-08-07 LAB — BASIC METABOLIC PANEL WITH GFR
Anion gap: 11 (ref 5–15)
BUN: 22 mg/dL (ref 8–23)
CO2: 21 mmol/L — ABNORMAL LOW (ref 22–32)
Calcium: 8.5 mg/dL — ABNORMAL LOW (ref 8.9–10.3)
Chloride: 110 mmol/L (ref 98–111)
Creatinine, Ser: 1.04 mg/dL (ref 0.61–1.24)
GFR, Estimated: >60 mL/min (ref >60)
Glucose, Bld: 104 mg/dL — ABNORMAL HIGH (ref 70–99)
Potassium: 4.4 mmol/L (ref 3.5–5.1)
Sodium: 142 mmol/L (ref 135–145)

## 2024-08-07 LAB — GLUCOSE, CAPILLARY
Glucose-Capillary: 108 mg/dL — ABNORMAL HIGH (ref 70–99)
Glucose-Capillary: 113 mg/dL — ABNORMAL HIGH (ref 70–99)
Glucose-Capillary: 122 mg/dL — ABNORMAL HIGH (ref 70–99)
Glucose-Capillary: 109 mg/dL — ABNORMAL HIGH (ref 70–99)
Glucose-Capillary: 154 mg/dL — ABNORMAL HIGH (ref 70–99)

## 2024-08-07 MED ORDER — CLOZAPINE 50 MG PO TABS: 50.0000 mg | ORAL_TABLET | Freq: Two times a day (BID) | ORAL | 0 refills | Status: AC

## 2024-08-07 MED ORDER — ATORVASTATIN CALCIUM 80 MG PO TABS: 80.0000 mg | ORAL_TABLET | Freq: Every day | ORAL | 0 refills | Status: AC

## 2024-08-07 MED ORDER — MIDODRINE HCL 5 MG PO TABS: 5.0000 mg | ORAL_TABLET | Freq: Three times a day (TID) | ORAL | 0 refills | Status: AC

## 2024-08-07 MED ORDER — ENSURE PLUS HIGH PROTEIN PO LIQD: 237.0000 mL | Freq: Two times a day (BID) | ORAL | 0 refills | Status: AC

## 2024-08-07 NOTE — Discharge Summary (Signed)
 Physician Discharge Summary   Patient: Jordan Kennedy MRN: [id number] DOB: [birth year]  Admit date:     07/26/2024  Discharge date: 08/07/24  Discharge Physician: Nena Rebel   PCP: [name] HERO, MD   Recommendations at discharge:   Continue medications as prescribed  Discharge Diagnoses: Principal Problem:   Respiratory distress Active Problems:   Congestive heart failure (HCC)   Shock (HCC)   Stroke (cerebrum) (HCC)   NSTEMI (non-ST elevated myocardial infarction) (HCC)   Protein-calorie malnutrition, severe  Resolved Problems:   * No resolved hospital problems. *  Hospital Course: 68 y.o. M with PMH schizophrenia and intellectual disability, HFrEF with EF <20% on last echo, hx of BPH and bladder outlet obstruction with indwelling foley and multiple recent admissions and ED visits for foley obstruction and UTI's, most recent discharge 9/20 who presented with respiratory distress and pin point pupils and was intubated in the ED. Labs significant for elevated lactic acid, troponin and WBC of 20k. CXR with patchy opacities and edema and CT head pending. PCCM consulted for admission.   Assessment and Plan:  Acute respiratory failure: Resolved Pulmonary edema vs multifocal PNA - RVP and urine strep neg Grew Klebsiella in sputum Encephalopathy, presumed metabolic. Now agitated at times, but very sensitive to sedatives.  -One way extubation 10/9 -Bronchial hygiene and RT/bronchodilator protocol   Subacute CVA - MRI brain with multiple non-hemorrhagic embolic infarcts in bilateral cerebral cortex, left thalamus, right cerebellar hemisphere. - Neuro has signed off - Continue heparin  considering cardiomyopathy while in the hospital - continue ASA, Atorvastatin   Sinus tachycardia: Resolved - Patient was taking metoprolol  12.5 mg daily at home. - Resumed metoprolol  12.5 mg daily     Hematuria -Developed hematuria while being on heparin  -Discussed with cardiology, Dr.  Francyne ;no benefit of continue with heparin  beyond 48 hours -Not a candidate for aggressive interventions including cardiac catheterization, defibrillator placement - Heparin  discontinued   Septic encephalopathy - Slowly improving - Swallow eval.  Start clears - TSH, ammonia wnl   UTI/septic shock- resolved -WBC still elevated at 24,000 UTI - ESBL Klebsiella.  Likely associated with chronic foley.  Possible HCAP BC -1 bottle blood growing staph hemolyticus. Likely a contaminant.  - S/p meropenem completed course of antibiotic for UTI, sepsis, pneumonia   Hypotension On midodrine  to 5mg  (home dose is 10)    NSTEMI HFrEF, end stage - echo 10/6 with EF <20% and GIIDD. Elevated troponins  Chronic hypotension  - Heparin  discontinued due to hematuria as above - Continue PTA midodrine  - Not a cath candidate   AKI - resolved Hypocalcemia Hypomagnesemia BPH  -Supportive care -follow renal indices, UOP   Elevated LFTs - shock state - Trend LFTs intermittently   Schizophrenia  -resume meds as appropriate  - Home clozapine  on hold but does have withdrawal potention so we should consider restarting when we can. Will need follow restart protocol with lower dose.  Clozapine  will prolong QTC but has very mild effect per pharmacy. -Will restart clozapine  at lower dose of 50 mg p.o. twice daily due to ongoing anxiety and agitation   GOC -Appreciate PMT assistance. Care to be coordinated with legal guardian only. Code status changed to DNR. One way extubation 10/9.        Consultants: Neurology, palliative care, critical care Procedures performed: Echocardiogram MRI of the brain Disposition: Skilled nursing facility Diet recommendation:  Discharge Diet Orders (From admission, onward)     Start     Ordered   08/07/24  0000  Diet - low sodium heart healthy        08/07/24 1159           Regular diet DISCHARGE MEDICATION: Allergies as of 08/07/2024   No Known Allergies       Medication List     STOP taking these medications    amantadine  100 MG capsule Commonly known as: SYMMETREL    azithromycin  500 MG tablet Commonly known as: ZITHROMAX    cefTRIAXone  1 g injection Commonly known as: ROCEPHIN    melatonin 5 MG Tabs   Myrbetriq  50 MG Tb24 tablet Generic drug: mirabegron  ER       TAKE these medications    Aspirin  EC Adult Low Dose 81 MG tablet Generic drug: aspirin  EC TAKE 1 TABLET BY MOUTH EVERY DAY   atorvastatin 80 MG tablet Commonly known as: LIPITOR Take 1 tablet (80 mg total) by mouth daily. Start taking on: August 08, 2024   B-12 1000 MCG Tabs Take 1 tablet (1,000 mcg total) by mouth daily.   cloZAPine  100 MG tablet Commonly known as: CLOZARIL  Take 2 tablets (200 mg total) by mouth 2 (two) times daily.   Farxiga  10 MG Tabs tablet Generic drug: dapagliflozin  propanediol TAKE 1 TABLET BY MOUTH EVERY DAY   feeding supplement Liqd Take 237 mLs by mouth 2 (two) times daily between meals.   finasteride  5 MG tablet Commonly known as: PROSCAR  Take 1 tablet (5 mg total) by mouth daily. What changed: when to take this   furosemide  40 MG tablet Commonly known as: LASIX  Take 40 mg by mouth daily as needed for fluid or edema.   Linzess  290 MCG Caps capsule Generic drug: linaclotide  TAKE 1 CAPSULE BY MOUTH EVERY MORNING BEFORE BREAKFAST What changed: See the new instructions.   metoCLOPramide  5 MG tablet Commonly known as: REGLAN  Take 5 mg by mouth 2 (two) times daily.   metoprolol  succinate 25 MG 24 hr tablet Commonly known as: TOPROL -XL Take 12.5 mg by mouth in the morning.   midodrine  5 MG tablet Commonly known as: PROAMATINE  Take 1 tablet (5 mg total) by mouth every 8 (eight) hours. What changed:  medication strength how much to take when to take this   omeprazole  40 MG capsule Commonly known as: PRILOSEC TAKE 1 CAPSULE BY MOUTH EVERY EVENING   PARoxetine  40 MG tablet Commonly known as: PAXIL  Take 40 mg  by mouth every evening.   polyethylene glycol powder 17 GM/SCOOP powder Commonly known as: GLYCOLAX /MIRALAX  Take 17 g by mouth 2 (two) times daily. Dissolve 1 capful (17g) in 4-8 ounces of liquid and take by mouth daily.   sacubitril -valsartan  24-26 MG Commonly known as: ENTRESTO  Take 1 tablet by mouth 2 (two) times daily. 8am & 8 pm   Stimulant Laxative 8.6-50 MG tablet Generic drug: senna-docusate Take 1 tablet by mouth 2 (two) times daily.   tamsulosin  0.4 MG Caps capsule Commonly known as: FLOMAX  Take 0.4 mg by mouth at bedtime.        Discharge Exam: Filed Weights   08/05/24 0500 08/06/24 0408 08/07/24 0510  Weight: 76.4 kg 76.3 kg 77.5 kg   Constitutional: Alert, awake, calm, comfortable HEENT: Neck supple Respiratory: clear to auscultation bilaterally, no wheezing, no crackles. Normal respiratory effort. No accessory muscle use.  Cardiovascular: Regular rate and rhythm, no murmurs / rubs / gallops. No extremity edema. 2+ pedal pulses. No carotid bruits.  Abdomen: no tenderness, no masses palpated. No hepatosplenomegaly. Bowel sounds positive.  Musculoskeletal: no clubbing / cyanosis. No  joint deformity upper and lower extremities. Good ROM, no contractures. Normal muscle tone.  Skin: no rashes, lesions, ulcers. No induration Neurologic: CN 2-12 grossly intact. Sensation intact, DTR normal. Strength 5/5 x all 4 extremities.  Psychiatric: Normal judgment and insight. Alert and oriented x 3. Normal mood.    Condition at discharge: good  The results of significant diagnostics from this hospitalization (including imaging, microbiology, ancillary and laboratory) are listed below for reference.   Imaging Studies: DG Abd 1 View Result Date: 08/04/2024 EXAM: 1 VIEW XRAY OF THE ABDOMEN 08/04/2024 01:55:00 PM COMPARISON: 07/27/2024 CLINICAL HISTORY: vomiting FINDINGS: BOWEL: Diffuse gaseous distension of small bowel and colon. Moderate colonic stool burden predominantly in  right colon. Findings may reflect ileus. SOFT TISSUES: No opaque urinary calculi. BONES: No acute osseous abnormality. CHEST: Cardiomegaly present. IMPRESSION: 1. Diffuse gaseous distension of small bowel and colon, possibly reflecting ileus. 2. Moderate stool burden in the right colon. Electronically signed by: Donnice Mania MD 08/04/2024 04:59 PM EDT RP Workstation: [id number]   DG CHEST PORT 1 VIEW Result Date: 07/30/2024 CLINICAL DATA:  Respiratory distress. EXAM: PORTABLE CHEST 1 VIEW COMPARISON:  Radiograph 07/26/2024, CT 07/01/2024 FINDINGS: Endotracheal tube tip 4 cm from the carina. Tip of the enteric tube is below the diaphragm not included in the field of view. Cardiomegaly stable. Improving interstitial and alveolar opacities from prior. Suspect small pleural effusions. No pneumothorax. IMPRESSION: 1. Endotracheal tube tip 4 cm from the carina. 2. Stable cardiomegaly. Improving interstitial and alveolar opacities from prior, favor improving edema. 3. Suspect small pleural effusions. Electronically Signed   By: Andrea Gasman M.D.   On: 07/30/2024 13:45   ECHOCARDIOGRAM COMPLETE Result Date: 07/27/2024    ECHOCARDIOGRAM REPORT   Patient Name:   DAVID RODRIQUEZ Date of Exam: 07/27/2024 Medical Rec #:  [id number]      Height:       77.0 in Accession #:    [id number]     Weight:       180.8 lb Date of Birth:  05/14/1956      BSA:          2.143 m Patient Age:    67 years       BP:           94/74 mmHg Patient Gender: M              HR:           81 bpm. Exam Location:  Inpatient Procedure: 2D Echo, Cardiac Doppler, Color Doppler and Intracardiac            Opacification Agent (Both Spectral and Color Flow Doppler were            utilized during procedure). Indications:    Elevated troponin R79.89  History:        Patient has prior history of Echocardiogram examinations, most                 recent 07/02/2024. CHF and Cardiomyopathy, Arrythmias:LBBB and                 PVC, Signs/Symptoms:Edema; Risk  Factors:Former Smoker.  Sonographer:    BERNARDA ROCKS Referring Phys: [id number] LAURA R GLEASON IMPRESSIONS  1. Left ventricular ejection fraction, by estimation, is <20%. The left ventricle has severely decreased function. The left ventricle demonstrates global hypokinesis. The left ventricular internal cavity size was severely dilated. Left ventricular diastolic parameters are consistent with Grade II diastolic dysfunction (pseudonormalization).  2. Right ventricular systolic function  is moderately reduced. The right ventricular size is moderately enlarged. There is mildly elevated pulmonary artery systolic pressure. The estimated right ventricular systolic pressure is 41.8 mmHg.  3. Left atrial size was severely dilated.  4. Right atrial size was severely dilated.  5. The mitral valve is normal in structure. Moderate to severe mitral valve regurgitation. No evidence of mitral stenosis.  6. Tricuspid valve regurgitation is moderate.  7. The aortic valve is tricuspid. Aortic valve regurgitation is not visualized. No aortic stenosis is present.  8. The inferior vena cava is dilated in size with <50% respiratory variability, suggesting right atrial pressure of 15 mmHg. FINDINGS  Left Ventricle: Left ventricular ejection fraction, by estimation, is <20%. The left ventricle has severely decreased function. The left ventricle demonstrates global hypokinesis. Definity  contrast agent was given IV to delineate the left ventricular endocardial borders. The left ventricular internal cavity size was severely dilated. There is no left ventricular hypertrophy. Abnormal (paradoxical) septal motion, consistent with left bundle branch block. Left ventricular diastolic parameters are consistent with Grade II diastolic dysfunction (pseudonormalization). Right Ventricle: The right ventricular size is moderately enlarged. No increase in right ventricular wall thickness. Right ventricular systolic function is moderately reduced. There is  mildly elevated pulmonary artery systolic pressure. The tricuspid regurgitant velocity is 2.59 m/s, and with an assumed right atrial pressure of 15 mmHg, the estimated right ventricular systolic pressure is 41.8 mmHg. Left Atrium: Left atrial size was severely dilated. Right Atrium: Right atrial size was severely dilated. Pericardium: Trivial pericardial effusion is present. The pericardial effusion is lateral to the left ventricle. Mitral Valve: The mitral valve is normal in structure. Moderate to severe mitral valve regurgitation, with posteriorly-directed jet. No evidence of mitral valve stenosis. MV peak gradient, 4.8 mmHg. The mean mitral valve gradient is 2.0 mmHg. Tricuspid Valve: The tricuspid valve is normal in structure. Tricuspid valve regurgitation is moderate . No evidence of tricuspid stenosis. Aortic Valve: The aortic valve is tricuspid. Aortic valve regurgitation is not visualized. No aortic stenosis is present. Aortic valve mean gradient measures 2.0 mmHg. Aortic valve peak gradient measures 4.0 mmHg. Aortic valve area, by VTI measures 2.78 cm. Pulmonic Valve: The pulmonic valve was normal in structure. Pulmonic valve regurgitation is trivial. No evidence of pulmonic stenosis. Aorta: The aortic root is normal in size and structure. Venous: The inferior vena cava is dilated in size with less than 50% respiratory variability, suggesting right atrial pressure of 15 mmHg. IAS/Shunts: No atrial level shunt detected by color flow Doppler.  LEFT VENTRICLE PLAX 2D LVIDd:         8.50 cm      Diastology LVIDs:         7.50 cm      LV e' medial:    5.00 cm/s LV PW:         0.60 cm      LV E/e' medial:  20.8 LV IVS:        0.70 cm      LV e' lateral:   7.94 cm/s LVOT diam:     2.30 cm      LV E/e' lateral: 13.1 LV SV:         47 LV SV Index:   22 LVOT Area:     4.15 cm  LV Volumes (MOD) LV vol d, MOD A2C: 538.0 ml LV vol d, MOD A4C: 407.0 ml LV vol s, MOD A2C: 358.0 ml LV vol s, MOD A4C: 348.0 ml LV SV MOD  A2C:     180.0 ml LV SV MOD A4C:     407.0 ml LV SV MOD BP:      140.3 ml RIGHT VENTRICLE            IVC RV Basal diam:  4.90 cm    IVC diam: 2.80 cm RV S prime:     7.83 cm/s TAPSE (M-mode): 1.4 cm RVSP:           41.8 mmHg LEFT ATRIUM              Index        RIGHT ATRIUM            Index LA diam:        4.10 cm  1.91 cm/m   RA Pressure: 15.00 mmHg LA Vol (A2C):   191.0 ml 89.14 ml/m  RA Area:     24.60 cm LA Vol (A4C):   74.1 ml  34.58 ml/m  RA Volume:   83.10 ml   38.78 ml/m LA Biplane Vol: 119.0 ml 55.54 ml/m  AORTIC VALVE                    PULMONIC VALVE AV Area (Vmax):    2.69 cm     PV Vmax:          0.66 m/s AV Area (Vmean):   2.68 cm     PV Peak grad:     1.7 mmHg AV Area (VTI):     2.78 cm     PR End Diast Vel: 0.61 msec AV Vmax:           99.70 cm/s AV Vmean:          61.900 cm/s AV VTI:            0.169 m AV Peak Grad:      4.0 mmHg AV Mean Grad:      2.0 mmHg LVOT Vmax:         64.60 cm/s LVOT Vmean:        39.900 cm/s LVOT VTI:          0.113 m LVOT/AV VTI ratio: 0.67  AORTA Ao Root diam: 3.30 cm Ao Asc diam:  2.80 cm MITRAL VALVE                TRICUSPID VALVE MV Area (PHT): 4.74 cm     TR Peak grad:   26.8 mmHg MV Area VTI:   2.01 cm     TR Vmax:        259.00 cm/s MV Peak grad:  4.8 mmHg     Estimated RAP:  15.00 mmHg MV Mean grad:  2.0 mmHg     RVSP:           41.8 mmHg MV Vmax:       1.09 m/s MV Vmean:      66.6 cm/s    SHUNTS MV Decel Time: 160 msec     Systemic VTI:  0.11 m MR Peak grad: 79.0 mmHg     Systemic Diam: 2.30 cm MR Mean grad: 45.5 mmHg MR Vmax:      444.33 cm/s MR Vmean:     314.5 cm/s MV E velocity: 104.00 cm/s MV A velocity: 80.20 cm/s MV E/A ratio:  1.30 Oneil Parchment MD Electronically signed by Oneil Parchment MD Signature Date/Time: 07/27/2024/2:08:59 PM    Final    CT ANGIO HEAD NECK W WO CM Result Date: 07/27/2024 EXAM:  CTA HEAD AND NECK WITH AND WITHOUT 07/27/2024 05:45:00 AM TECHNIQUE: CTA of the head and neck was performed with and without the administration of 75  mL of iohexol  (OMNIPAQUE ) 350 MG/ML injection. Multiplanar 2D and/or 3D reformatted images are provided for review. Automated exposure control, iterative reconstruction, and/or weight based adjustment of the mA/kV was utilized to reduce the radiation dose to as low as reasonably achievable. Stenosis of the internal carotid arteries measured using NASCET criteria. COMPARISON: None available CLINICAL HISTORY: Neuro deficit, acute, stroke suspected. Chief complaints; Respiratory Distress; CT ANGIO HEAD NECK W WO CM; Neuro deficit, Acute, Stroke suspected.; 75 [id number] 18G IV LAC; Without Incident. FINDINGS: CTA NECK: AORTIC ARCH AND ARCH VESSELS: No dissection or arterial injury. No significant stenosis of the brachiocephalic or subclavian arteries. CERVICAL CAROTID ARTERIES: No dissection, arterial injury, or hemodynamically significant stenosis by NASCET criteria. CERVICAL VERTEBRAL ARTERIES: No dissection, arterial injury, or significant stenosis. There is severe stenosis of the distal V4 segment of the left vertebral artery. The right vertebral artery is also diffusely irregular. LUNGS AND MEDIASTINUM: Unremarkable. SOFT TISSUES: No acute abnormality. BONES: No acute abnormality. CTA HEAD: ANTERIOR CIRCULATION: No significant stenosis of the internal carotid arteries. The A1 segments are mildly irregular in contour. The M1 segments of the middle cerebral arteries are irregular in contour and demonstrate diffuse mild-to-moderate stenosis. There are moderate irregular stenoses of the proximal M2 branches of the middle cerebral arteries bilaterally, slightly worse on the right. The M2 branches appeared diffusely diminutive and irregular. No aneurysm. POSTERIOR CIRCULATION: The posterior cerebral arteries are diminutive and irregular. The basilar artery is irregular in contour and diminutive. No significant stenosis of the vertebral arteries. No aneurysm. OTHER: No dural venous sinus thrombosis on this non-dedicated  study. 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 IMPRESSION: 1. 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 Electronically signed by: Evalene Coho MD 07/27/2024 06:16 AM EDT RP Workstation: [id number]   MR BRAIN WO CONTRAST Result Date: 07/27/2024 EXAM: MRI BRAIN WITHOUT CONTRAST 07/27/2024 05:26:00 AM TECHNIQUE: Multiplanar multisequence MRI of the head/brain was performed without the administration of intravenous contrast. COMPARISON: CT of the head dated 07/26/2024. CLINICAL HISTORY: Stroke, follow up. FINDINGS: BRAIN AND VENTRICLES: Restricted diffusion is present within the left mid frontal lobe, correlating with the area of diminished attenuation on the previous CT. There are also multiple foci of restricted diffusion present within the cerebral cortex bilaterally and within the left thalamus and right cerebellar hemisphere. A punctate cortical infarct is present in the right posterior frontal lobe on image 89 of series 5. There are a couple of foci of restricted diffusion at the right parietooccipital junction seen on images 81 and 82. There are a couple of foci of restricted diffusion in the right parietal lobe on image 90. There are also small foci of restricted diffusion present within the left occipital lobe and left temporal occipital junction. The findings are compatible with embolic non-hemorrhagic infarcts. There is mild cerebral white matter disease also present. No intracranial hemorrhage. No mass. No midline shift. No hydrocephalus. The sella is unremarkable. Normal flow voids. ORBITS: No acute abnormality. SINUSES AND MASTOIDS: No acute abnormality. BONES AND SOFT TISSUES: Normal marrow signal. No acute soft tissue abnormality. IMPRESSION: 1. Multiple acute non-hemorrhagic embolic infarcts involving the bilateral cerebral cortex, left thalamus, and right cerebellar hemisphere. 2. 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  Electronically signed by: Evalene Coho MD 07/27/2024 05:50 AM EDT RP Workstation: [id number]   DG Pelvis 1-2 Views Result Date: 07/27/2024 EXAM: 1  or 2 VIEW(S) XRAY OF THE PELVIS 07/27/2024 02:03:58 AM COMPARISON: None available. CLINICAL HISTORY: imaging to screen for metal prior to MRI FINDINGS: BONES AND JOINTS: Metallic probe overlies the inferior midline pelvis. No unexpected retained metallic foreign body within the pelvis SOFT TISSUES: Pelvic vascular calcifications. The soft tissues are unremarkable. IMPRESSION: 1. No unexpected retained metallic foreign body within the pelvis. Electronically signed by: Dorethia Molt MD 07/27/2024 02:21 AM EDT RP Workstation: [id number]   DG Abd 1 View Result Date: 07/27/2024 EXAM: 1 VIEW XRAY OF THE ABDOMEN 07/27/2024 02:03:58 AM COMPARISON: 05/16/2024 CLINICAL HISTORY: imaging to screen for metal prior to MRI FINDINGS: LINES, TUBES AND DEVICES: Transesophageal tube in place with tip terminating in the left upper quadrant within the stomach, with the side port at the level of the GE junction. BOWEL: Diffuse gaseous distention of the colon. Moderate colonic stool burden. Nonobstructive bowel gas pattern. SOFT TISSUES: No opaque urinary calculi. BONES: No acute osseous abnormality. IMPRESSION: 1. No unexpected retained metallic foreign body within the visualized chest and abdomen. 2. Diffuse gaseous distention of the colon and moderate colonic stool burden. Electronically signed by: Dorethia Molt MD 07/27/2024 02:20 AM EDT RP Workstation: [id number]   CT Head Wo Contrast Result Date: 07/26/2024 EXAM: CT HEAD WITHOUT CONTRAST 07/26/2024 09:39:45 PM TECHNIQUE: CT of the head was performed without the administration of intravenous contrast. Automated exposure control, iterative reconstruction, and/or weight based adjustment of the mA/kV was utilized to reduce the radiation dose to as low as reasonably achievable. COMPARISON: CT Head dated 06/09/2024. CLINICAL  HISTORY: Mental status change, unknown cause. FINDINGS: BRAIN AND VENTRICLES: Probable early subacute infarct within the anterior left MCA territory. ASPECTS score is 9 (0-10 with 10 being normal), with ganglionic level (caudate, lentiform nuclei, internal capsule, insula, M1-M3 cortex) scored at 7 and supraganglionic (M4-M6 cortex) scored at 2. No acute hemorrhage, hydrocephalus, extra-axial collection, mass effect, or midline shift. ORBITS: No acute abnormality. SINUSES: No acute abnormality. SOFT TISSUES AND SKULL: No acute soft tissue abnormality. No skull fracture. IMPRESSION: 1. Probable early subacute infarct within the anterior left MCA territory. 2. ASPECTS 9. Electronically signed by: Franky Stanford MD 07/26/2024 09:43 PM EDT RP Workstation: [id number]   DG Chest Portable 1 View Result Date: 07/26/2024 CLINICAL DATA:  Questionable sepsis - evaluate for abnormality EXAM: PORTABLE CHEST 1 VIEW COMPARISON:  Chest x-ray 07/01/2024, CT angio chest 07/01/2024 FINDINGS: Endotracheal tube terminates 5 cm above the carina. Enteric tube courses below the hemidiaphragm with tip overlying the expected region of the gastric lumen and side port overlying the expected region of the gastroesophageal junction. The heart and mediastinal contours are unchanged. Vague diffuse patchy airspace and interstitial opacities. No pleural effusion. No pneumothorax. No acute osseous abnormality. IMPRESSION: 1. Enteric tube courses below the hemidiaphragm with tip overlying the expected region of the gastric lumen and side port overlying the expected region of the gastroesophageal junction. Consider advancing by 3 cm. 2. Vague diffuse patchy airspace and interstitial opacities. Finding may represent infection/inflammation versus pulmonary edema. Followup PA and lateral chest X-ray is recommended in 3-4 weeks following therapy to ensure resolution. Electronically Signed   By: Morgane  Naveau M.D.   On: 07/26/2024 18:36     Microbiology: Results for orders placed or performed during the hospital encounter of 07/26/24  Blood Culture (routine x 2)     Status: None   Collection Time: 07/26/24  5:30 PM   Specimen: BLOOD  Result Value Ref Range Status   Specimen Description BLOOD RIGHT ANTECUBITAL  Final   Special Requests   Final    BOTTLES DRAWN AEROBIC AND ANAEROBIC Blood Culture adequate volume   Culture   Final    NO GROWTH 5 DAYS Performed at [facility], [address] [place], [place] [address]    Report Status 07/31/2024 FINAL  Final  Resp panel by RT-PCR (RSV, Flu A&B, Covid) Urine, Catheterized     Status: None   Collection Time: 07/26/24  5:31 PM   Specimen: Urine, Catheterized; Nasal Swab  Result Value Ref Range Status   SARS Coronavirus 2 by RT PCR NEGATIVE NEGATIVE Final   Influenza A by PCR NEGATIVE NEGATIVE Final   Influenza B by PCR NEGATIVE NEGATIVE Final    Comment: (NOTE) The Xpert Xpress SARS-CoV-2/FLU/RSV plus assay is intended as an aid in the diagnosis of influenza from Nasopharyngeal swab specimens and should not be used as a sole basis for treatment. Nasal washings and aspirates are unacceptable for Xpert Xpress SARS-CoV-2/FLU/RSV testing.  Fact Sheet for Patients: BloggerCourse.com  Fact Sheet for Healthcare Providers: SeriousBroker.it  This test is not yet approved or cleared by the [place]  FDA and has been authorized for detection and/or diagnosis of SARS-CoV-2 by FDA under an Emergency Use Authorization (EUA). This EUA will remain in effect (meaning this test can be used) for the duration of the COVID-19 declaration under Section 564(b)(1) of the Act, 21 U.S.C. section 360bbb-3(b)(1), unless the authorization is terminated or revoked.     Resp Syncytial Virus by PCR NEGATIVE NEGATIVE Final    Comment: (NOTE) Fact Sheet for Patients: BloggerCourse.com  Fact Sheet for  Healthcare Providers: SeriousBroker.it  This test is not yet approved or cleared by the [place]  FDA and has been authorized for detection and/or diagnosis of SARS-CoV-2 by FDA under an Emergency Use Authorization (EUA). This EUA will remain in effect (meaning this test can be used) for the duration of the COVID-19 declaration under Section 564(b)(1) of the Act, 21 U.S.C. section 360bbb-3(b)(1), unless the authorization is terminated or revoked.  Performed at [facility], [address] [place], [place] [address]   Urine Culture     Status: Abnormal   Collection Time: 07/26/24  5:31 PM   Specimen: Urine, Random  Result Value Ref Range Status   Specimen Description URINE, RANDOM  Final   Special Requests   Final    NONE Reflexed from [id number] Performed at [facility], [address], [place], [place] [address]    Culture (A)  Final    60,000 COLONIES/mL KLEBSIELLA PNEUMONIAE Confirmed Extended Spectrum Beta-Lactamase Producer (ESBL).  In bloodstream infections from ESBL organisms, carbapenems are preferred over piperacillin /tazobactam. They are shown to have a lower risk of mortality.    Report Status 07/29/2024 FINAL  Final   Organism ID, Bacteria KLEBSIELLA PNEUMONIAE (A)  Final      Susceptibility   Klebsiella pneumoniae - MIC*    AMPICILLIN >=32 RESISTANT Resistant     CEFAZOLIN (URINE) Value in next row Resistant      >=32 RESISTANTThis is a modified FDA-approved test that has been validated and its performance characteristics determined by the reporting laboratory.  This laboratory is certified under the Clinical Laboratory Improvement Amendments CLIA as qualified to perform high complexity clinical laboratory testing.    CEFEPIME  Value in next row Resistant      >=32 RESISTANTThis is a modified FDA-approved test that has been validated and its performance characteristics determined by the reporting laboratory.  This laboratory  is  certified under the Clinical Laboratory Improvement Amendments CLIA as qualified to perform high complexity clinical laboratory testing.    ERTAPENEM Value in next row Intermediate      >=32 RESISTANTThis is a modified FDA-approved test that has been validated and its performance characteristics determined by the reporting laboratory.  This laboratory is certified under the Clinical Laboratory Improvement Amendments CLIA as qualified to perform high complexity clinical laboratory testing.    CEFTRIAXONE  Value in next row Resistant      >=32 RESISTANTThis is a modified FDA-approved test that has been validated and its performance characteristics determined by the reporting laboratory.  This laboratory is certified under the Clinical Laboratory Improvement Amendments CLIA as qualified to perform high complexity clinical laboratory testing.    CIPROFLOXACIN  Value in next row Resistant      >=32 RESISTANTThis is a modified FDA-approved test that has been validated and its performance characteristics determined by the reporting laboratory.  This laboratory is certified under the Clinical Laboratory Improvement Amendments CLIA as qualified to perform high complexity clinical laboratory testing.    GENTAMICIN Value in next row Sensitive      >=32 RESISTANTThis is a modified FDA-approved test that has been validated and its performance characteristics determined by the reporting laboratory.  This laboratory is certified under the Clinical Laboratory Improvement Amendments CLIA as qualified to perform high complexity clinical laboratory testing.    NITROFURANTOIN Value in next row Resistant      >=32 RESISTANTThis is a modified FDA-approved test that has been validated and its performance characteristics determined by the reporting laboratory.  This laboratory is certified under the Clinical Laboratory Improvement Amendments CLIA as qualified to perform high complexity clinical laboratory testing.    TRIMETH /SULFA   Value in next row Resistant      >=32 RESISTANTThis is a modified FDA-approved test that has been validated and its performance characteristics determined by the reporting laboratory.  This laboratory is certified under the Clinical Laboratory Improvement Amendments CLIA as qualified to perform high complexity clinical laboratory testing.    AMPICILLIN/SULBACTAM Value in next row Resistant      >=32 RESISTANTThis is a modified FDA-approved test that has been validated and its performance characteristics determined by the reporting laboratory.  This laboratory is certified under the Clinical Laboratory Improvement Amendments CLIA as qualified to perform high complexity clinical laboratory testing.    PIP/TAZO Value in next row Resistant      >=128 RESISTANTThis is a modified FDA-approved test that has been validated and its performance characteristics determined by the reporting laboratory.  This laboratory is certified under the Clinical Laboratory Improvement Amendments CLIA as qualified to perform high complexity clinical laboratory testing.    MEROPENEM Value in next row Sensitive      >=128 RESISTANTThis is a modified FDA-approved test that has been validated and its performance characteristics determined by the reporting laboratory.  This laboratory is certified under the Clinical Laboratory Improvement Amendments CLIA as qualified to perform high complexity clinical laboratory testing.    * 60,000 COLONIES/mL KLEBSIELLA PNEUMONIAE  Blood Culture (routine x 2)     Status: Abnormal   Collection Time: 07/26/24  5:35 PM   Specimen: BLOOD  Result Value Ref Range Status   Specimen Description BLOOD SITE NOT SPECIFIED  Final   Special Requests   Final    BOTTLES DRAWN AEROBIC AND ANAEROBIC Blood Culture adequate volume   Culture  Setup Time   Final    GRAM POSITIVE COCCI AEROBIC BOTTLE ONLY  CRITICAL RESULT CALLED TO, READ BACK BY AND VERIFIED WITH: PHARMD AUSTIN PAYTES ON 07/27/24 @ 2029 BY DRT     Culture (A)  Final    STAPHYLOCOCCUS HAEMOLYTICUS THE SIGNIFICANCE OF ISOLATING THIS ORGANISM FROM A SINGLE SET OF BLOOD CULTURES WHEN MULTIPLE SETS ARE DRAWN IS UNCERTAIN. PLEASE NOTIFY THE MICROBIOLOGY DEPARTMENT WITHIN ONE WEEK IF SPECIATION AND SENSITIVITIES ARE REQUIRED. Performed at [facility], [address] [place], [place] [address]    Report Status 07/29/2024 FINAL  Final  MRSA Next Gen by PCR, Nasal     Status: None   Collection Time: 07/26/24  8:36 PM   Specimen: Nasal Mucosa; Nasal Swab  Result Value Ref Range Status   MRSA by PCR Next Gen NOT DETECTED NOT DETECTED Final    Comment: (NOTE) The GeneXpert MRSA Assay (FDA approved for NASAL specimens only), is one component of a comprehensive MRSA colonization surveillance program. It is not intended to diagnose MRSA infection nor to guide or monitor treatment for MRSA infections. Test performance is not FDA approved in patients less than 29 years old. Performed at [facility], [address], [place], [place] [address]   Blood Culture ID Panel (Reflexed)     Status: Abnormal   Collection Time: 07/27/24 12:00 PM  Result Value Ref Range Status   Enterococcus faecalis NOT DETECTED NOT DETECTED Final   Enterococcus Faecium NOT DETECTED NOT DETECTED Final   Listeria monocytogenes NOT DETECTED NOT DETECTED Final   Staphylococcus species DETECTED (A) NOT DETECTED Final    Comment: CRITICAL RESULT CALLED TO, READ BACK BY AND VERIFIED WITH: PHARMD AUSTIN PAYTES ON 07/27/24 @ 2029 BY DRT    Staphylococcus aureus (BCID) NOT DETECTED NOT DETECTED Final   Staphylococcus epidermidis NOT DETECTED NOT DETECTED Final   Staphylococcus lugdunensis NOT DETECTED NOT DETECTED Final   Streptococcus species NOT DETECTED NOT DETECTED Final   Streptococcus agalactiae NOT DETECTED NOT DETECTED Final   Streptococcus pneumoniae NOT DETECTED NOT DETECTED Final   Streptococcus pyogenes NOT DETECTED NOT DETECTED Final    A.calcoaceticus-baumannii NOT DETECTED NOT DETECTED Final   Bacteroides fragilis NOT DETECTED NOT DETECTED Final   Enterobacterales NOT DETECTED NOT DETECTED Final   Enterobacter cloacae complex NOT DETECTED NOT DETECTED Final   Escherichia coli NOT DETECTED NOT DETECTED Final   Klebsiella aerogenes NOT DETECTED NOT DETECTED Final   Klebsiella oxytoca NOT DETECTED NOT DETECTED Final   Klebsiella pneumoniae NOT DETECTED NOT DETECTED Final   Proteus species NOT DETECTED NOT DETECTED Final   Salmonella species NOT DETECTED NOT DETECTED Final   Serratia marcescens NOT DETECTED NOT DETECTED Final   Haemophilus influenzae NOT DETECTED NOT DETECTED Final   Neisseria meningitidis NOT DETECTED NOT DETECTED Final   Pseudomonas aeruginosa NOT DETECTED NOT DETECTED Final   Stenotrophomonas maltophilia NOT DETECTED NOT DETECTED Final   Candida albicans NOT DETECTED NOT DETECTED Final   Candida auris NOT DETECTED NOT DETECTED Final   Candida glabrata NOT DETECTED NOT DETECTED Final   Candida krusei NOT DETECTED NOT DETECTED Final   Candida parapsilosis NOT DETECTED NOT DETECTED Final   Candida tropicalis NOT DETECTED NOT DETECTED Final   Cryptococcus neoformans/gattii NOT DETECTED NOT DETECTED Final    Comment: Performed at [facility], [address], [place], [place] [address]  Culture, Respiratory w Gram Stain     Status: None   Collection Time: 07/28/24 10:37 AM   Specimen: Tracheal Aspirate; Respiratory  Result Value Ref Range Status  Specimen Description TRACHEAL ASPIRATE  Final   Special Requests NONE  Final   Gram Stain   Final    NO WBC SEEN NO ORGANISMS SEEN Performed at [facility], [address] [place], [place] [address]    Culture   Final    RARE KLEBSIELLA PNEUMONIAE Confirmed Extended Spectrum Beta-Lactamase Producer (ESBL).  In bloodstream infections from ESBL organisms, carbapenems are preferred over piperacillin /tazobactam. They are shown to have a lower risk  of mortality. FEW CANDIDA ALBICANS    Report Status 07/31/2024 FINAL  Final   Organism ID, Bacteria KLEBSIELLA PNEUMONIAE  Final      Susceptibility   Klebsiella pneumoniae - MIC*    AMPICILLIN >=32 RESISTANT Resistant     CEFAZOLIN (NON-URINE) >=32 RESISTANT Resistant     CEFEPIME  >=32 RESISTANT Resistant     ERTAPENEM <=0.12 SENSITIVE Sensitive     CEFTRIAXONE  >=64 RESISTANT Resistant     CIPROFLOXACIN  0.5 INTERMEDIATE Intermediate     GENTAMICIN <=1 SENSITIVE Sensitive     MEROPENEM <=0.25 SENSITIVE Sensitive     TRIMETH /SULFA  >=320 RESISTANT Resistant     AMPICILLIN/SULBACTAM >=32 RESISTANT Resistant     PIP/TAZO Value in next row Sensitive      <=4 SENSITIVEThis is a modified FDA-approved test that has been validated and its performance characteristics determined by the reporting laboratory.  This laboratory is certified under the Clinical Laboratory Improvement Amendments CLIA as qualified to perform high complexity clinical laboratory testing.    * RARE KLEBSIELLA PNEUMONIAE    Labs: CBC: Recent Labs  Lab 08/03/24 0513 08/04/24 0412 08/05/24 0418 08/06/24 0514 08/07/24 0439  WBC 18.8* 24.3* 17.8* 16.3* 14.2*  HGB 11.5* 13.8 10.2* 10.1* 10.8*  HCT 35.7* 43.9 31.6* 31.8* 33.5*  MCV 91.5 93.2 92.9 93.8 92.8  PLT 446* 558* 465* 407* 491*   Basic Metabolic Panel: Recent Labs  Lab 08/01/24 0701 08/02/24 0418 08/04/24 1542 08/05/24 1909 08/06/24 0514 08/07/24 0439  NA 144 139 142 142  --  142  K 3.7 3.6 4.4 4.4  --  4.4  CL 109 108 111 111  --  110  CO2 21* 23 21* 21*  --  21*  GLUCOSE 86 110* 110* 123*  --  104*  BUN 10 10 19  24*  --  22  CREATININE 0.98 0.86 1.18 1.27*  --  1.04  CALCIUM  8.1* 8.0* 8.9 8.5*  --  8.5*  MG 1.8  --   --   --  2.1  --   PHOS 2.1*  --   --   --   --   --    Liver Function Tests: Recent Labs  Lab 08/04/24 1542 08/05/24 1909  AST 33 29  ALT 40 36  ALKPHOS 116 100  BILITOT 1.0 0.9  PROT 6.4* 5.9*  ALBUMIN  2.4* 2.3*    CBG: Recent Labs  Lab 08/06/24 1606 08/06/24 2007 08/06/24 2343 08/07/24 0514 08/07/24 0732  GLUCAP 136* 114* 111* 108* 113*    Discharge time spent: greater than 30 minutes.  Signed: Nena Rebel, MD [facility] 08/07/2024

## 2024-08-07 NOTE — Plan of Care (Signed)
 Patient will transfer to rehab tonight.  Report called and patient remain stable.

## 2024-08-07 NOTE — Progress Notes (Signed)
 Called report to [facility].  RN had taken care of him in pass so gave updates.

## 2024-08-07 NOTE — TOC Transition Note (Signed)
 Transition of Care [facility]) - Discharge Note   Patient Details  Name: Jordan Kennedy MRN: [id number] Date of Birth: 12-Jan-1956  Transition of Care [facility]) CM/SW Contact:  Inocente GORMAN Kindle, LCSW Phone Number: 08/07/2024, 3:59 PM   Clinical Narrative:    Patient will DC to: [facility] Anticipated DC date: 08/07/24 Family notified: Sherlynn Junk with DSS Transport by: [facility] as requested by SNF   Per MD patient ready for DC to [facility]. RN to call report prior to discharge [phone]). RN, patient, patient's family, and facility notified of DC. Discharge Summary and FL2 sent to facility. DC packet on chart including signed DNR. Ambulance transport requested for patient.   CSW will sign off for now as social work intervention is no longer needed. Please consult us  again if new needs arise.     Final next level of care: Skilled Nursing Facility Barriers to Discharge: Barriers Resolved   Patient Goals and CMS Choice Patient states their goals for this hospitalization and ongoing recovery are:: return to snf CMS Medicare.gov Compare Post Acute Care list provided to:: Legal Guardian        Discharge Placement   Existing PASRR number confirmed : 08/07/24          Patient chooses bed at: [facility] Patient to be transferred to facility by: [facility] Name of family member notified: [name] Monroe/DSS guardian Patient and family notified of of transfer: 08/07/24  Discharge Plan and Services Additional resources added to the After Visit Summary for                                       Social Drivers of Health (SDOH) Interventions SDOH Screenings   Food Insecurity: Patient Unable To Answer (07/02/2024)  Housing: Patient Unable To Answer (07/02/2024)  Transportation Needs: Patient Unable To Answer (07/02/2024)  Utilities: Patient Unable To Answer (07/02/2024)  Alcohol Screen: Low Risk  (11/05/2023)  Depression (PHQ2-9): Low Risk  (11/05/2023)  Financial  Resource Strain: Low Risk  (11/05/2023)  Physical Activity: Inactive (11/05/2023)  Social Connections: Patient Unable To Answer (07/02/2024)  Stress: No Stress Concern Present (11/05/2023)  Tobacco Use: Medium Risk (08/07/2024)  Health Literacy: Adequate Health Literacy (11/05/2023)     Readmission Risk Interventions    07/30/2024    2:39 PM 06/28/2024   11:41 AM 06/11/2024   10:27 AM  Readmission Risk Prevention Plan  Transportation Screening Complete Complete Complete  PCP or Specialist Appt within 5-7 Days  Complete   PCP or Specialist Appt within 3-5 Days   Complete  Home Care Screening  Complete   Medication Review (RN CM)  Complete   HRI or Home Care Consult   Complete  Social Work Consult for Recovery Care Planning/Counseling   Complete  Palliative Care Screening   Not Applicable  Medication Review Oceanographer) Complete  Complete  PCP or Specialist appointment within 3-5 days of discharge Complete    HRI or Home Care Consult Complete    SW Recovery Care/Counseling Consult Complete    Palliative Care Screening Complete    Skilled Nursing Facility Complete

## 2024-08-26 ENCOUNTER — Ambulatory Visit: Admitting: Podiatry

## 2024-11-06 ENCOUNTER — Ambulatory Visit: Payer: Medicare Other

## 2024-11-10 ENCOUNTER — Ambulatory Visit
